# Patient Record
Sex: Female | Born: 1970
Health system: Southern US, Community
[De-identification: ages and names within clinical notes are randomized; demographics above are authoritative.]

## PROBLEM LIST (undated history)

## (undated) DIAGNOSIS — F419 Anxiety disorder, unspecified: Secondary | ICD-10-CM

## (undated) DIAGNOSIS — K449 Diaphragmatic hernia without obstruction or gangrene: Secondary | ICD-10-CM

## (undated) DIAGNOSIS — E785 Hyperlipidemia, unspecified: Secondary | ICD-10-CM

## (undated) DIAGNOSIS — K581 Irritable bowel syndrome with constipation: Secondary | ICD-10-CM

## (undated) DIAGNOSIS — G4731 Primary central sleep apnea: Secondary | ICD-10-CM

## (undated) DIAGNOSIS — F411 Generalized anxiety disorder: Secondary | ICD-10-CM

## (undated) DIAGNOSIS — Z803 Family history of malignant neoplasm of breast: Secondary | ICD-10-CM

## (undated) DIAGNOSIS — Z8042 Family history of malignant neoplasm of prostate: Secondary | ICD-10-CM

## (undated) DIAGNOSIS — F32A Depression, unspecified: Secondary | ICD-10-CM

## (undated) DIAGNOSIS — F329 Major depressive disorder, single episode, unspecified: Secondary | ICD-10-CM

## (undated) DIAGNOSIS — Z800 Family history of malignant neoplasm of digestive organs: Secondary | ICD-10-CM

## (undated) DIAGNOSIS — G4733 Obstructive sleep apnea (adult) (pediatric): Secondary | ICD-10-CM

## (undated) DIAGNOSIS — R7303 Prediabetes: Secondary | ICD-10-CM

## (undated) DIAGNOSIS — E782 Mixed hyperlipidemia: Secondary | ICD-10-CM

## (undated) DIAGNOSIS — K219 Gastro-esophageal reflux disease without esophagitis: Secondary | ICD-10-CM

## (undated) DIAGNOSIS — Z973 Presence of spectacles and contact lenses: Secondary | ICD-10-CM

## (undated) DIAGNOSIS — G4700 Insomnia, unspecified: Secondary | ICD-10-CM

## (undated) DIAGNOSIS — R0600 Dyspnea, unspecified: Secondary | ICD-10-CM

## (undated) DIAGNOSIS — R0609 Other forms of dyspnea: Secondary | ICD-10-CM

## (undated) DIAGNOSIS — J302 Other seasonal allergic rhinitis: Secondary | ICD-10-CM

## (undated) DIAGNOSIS — N8500 Endometrial hyperplasia, unspecified: Secondary | ICD-10-CM

## (undated) DIAGNOSIS — F319 Bipolar disorder, unspecified: Secondary | ICD-10-CM

## (undated) DIAGNOSIS — I10 Essential (primary) hypertension: Secondary | ICD-10-CM

## (undated) DIAGNOSIS — M1990 Unspecified osteoarthritis, unspecified site: Secondary | ICD-10-CM

## (undated) DIAGNOSIS — Z87442 Personal history of urinary calculi: Secondary | ICD-10-CM

## (undated) DIAGNOSIS — N939 Abnormal uterine and vaginal bleeding, unspecified: Secondary | ICD-10-CM

## (undated) DIAGNOSIS — Z801 Family history of malignant neoplasm of trachea, bronchus and lung: Secondary | ICD-10-CM

## (undated) DIAGNOSIS — J189 Pneumonia, unspecified organism: Secondary | ICD-10-CM

## (undated) HISTORY — PX: DILATION AND CURETTAGE OF UTERUS: SHX78

## (undated) HISTORY — DX: Family history of malignant neoplasm of breast: Z80.3

## (undated) HISTORY — DX: Family history of malignant neoplasm of digestive organs: Z80.0

## (undated) HISTORY — PX: DILATION AND EVACUATION: SHX1459

## (undated) HISTORY — DX: Family history of malignant neoplasm of prostate: Z80.42

## (undated) HISTORY — DX: Family history of malignant neoplasm of trachea, bronchus and lung: Z80.1

## (undated) HISTORY — DX: Hyperlipidemia, unspecified: E78.5

## (undated) HISTORY — DX: Bipolar disorder, unspecified: F31.9

## (undated) HISTORY — PX: CHOLECYSTECTOMY: SHX55

## (undated) HISTORY — DX: Anxiety disorder, unspecified: F41.9

---

## 1997-08-11 ENCOUNTER — Other Ambulatory Visit: Admission: RE | Admit: 1997-08-11 | Discharge: 1997-08-11 | Payer: Self-pay | Admitting: Gynecology

## 1997-11-29 ENCOUNTER — Emergency Department (HOSPITAL_COMMUNITY): Admission: EM | Admit: 1997-11-29 | Discharge: 1997-11-30 | Payer: Self-pay | Admitting: Emergency Medicine

## 1997-11-30 ENCOUNTER — Encounter: Payer: Self-pay | Admitting: Emergency Medicine

## 1999-12-09 ENCOUNTER — Other Ambulatory Visit: Admission: RE | Admit: 1999-12-09 | Discharge: 1999-12-09 | Payer: Self-pay | Admitting: *Deleted

## 2000-04-14 ENCOUNTER — Encounter: Payer: Self-pay | Admitting: *Deleted

## 2000-04-14 ENCOUNTER — Encounter: Admission: RE | Admit: 2000-04-14 | Discharge: 2000-04-14 | Payer: Self-pay | Admitting: *Deleted

## 2001-11-16 ENCOUNTER — Other Ambulatory Visit: Admission: RE | Admit: 2001-11-16 | Discharge: 2001-11-16 | Payer: Self-pay | Admitting: Obstetrics and Gynecology

## 2002-11-18 ENCOUNTER — Other Ambulatory Visit: Admission: RE | Admit: 2002-11-18 | Discharge: 2002-11-18 | Payer: Self-pay | Admitting: Obstetrics and Gynecology

## 2004-04-17 ENCOUNTER — Emergency Department (HOSPITAL_COMMUNITY): Admission: EM | Admit: 2004-04-17 | Discharge: 2004-04-17 | Payer: Self-pay | Admitting: Emergency Medicine

## 2004-05-31 ENCOUNTER — Emergency Department (HOSPITAL_COMMUNITY): Admission: EM | Admit: 2004-05-31 | Discharge: 2004-05-31 | Payer: Self-pay | Admitting: *Deleted

## 2005-07-15 ENCOUNTER — Ambulatory Visit (HOSPITAL_COMMUNITY): Admission: RE | Admit: 2005-07-15 | Discharge: 2005-07-15 | Payer: Self-pay | Admitting: Obstetrics and Gynecology

## 2005-07-15 ENCOUNTER — Encounter (INDEPENDENT_AMBULATORY_CARE_PROVIDER_SITE_OTHER): Payer: Self-pay | Admitting: Specialist

## 2005-07-15 HISTORY — PX: DILATION AND EVACUATION: SHX1459

## 2005-09-03 ENCOUNTER — Emergency Department (HOSPITAL_COMMUNITY): Admission: EM | Admit: 2005-09-03 | Discharge: 2005-09-03 | Payer: Self-pay | Admitting: Family Medicine

## 2006-01-14 ENCOUNTER — Emergency Department (HOSPITAL_COMMUNITY): Admission: EM | Admit: 2006-01-14 | Discharge: 2006-01-14 | Payer: Self-pay | Admitting: Emergency Medicine

## 2006-12-27 ENCOUNTER — Encounter: Admission: RE | Admit: 2006-12-27 | Discharge: 2006-12-27 | Payer: Self-pay | Admitting: Internal Medicine

## 2010-02-21 ENCOUNTER — Encounter: Payer: Self-pay | Admitting: Obstetrics and Gynecology

## 2010-02-21 ENCOUNTER — Encounter: Payer: Self-pay | Admitting: Internal Medicine

## 2010-06-18 NOTE — Op Note (Signed)
Samantha Greene, Samantha Greene             ACCOUNT NO.:  [id number]   MEDICAL RECORD NO.:  [id number]          PATIENT TYPE:  AMB   LOCATION:  SDC                           FACILITY:  [facility]   PHYSICIAN:  Lenoard Aden, M.D.DATE OF BIRTH:  04/18/1970   DATE OF PROCEDURE:  07/15/2005  DATE OF DISCHARGE:                                 OPERATIVE REPORT   PREOPERATIVE DIAGNOSIS:  Twelve-week intrauterine pregnancy with missed  abortion.   POSTOPERATIVE DIAGNOSIS:  Twelve-week intrauterine pregnancy with missed  abortion.   PROCEDURE:  Suction D&U.   SURGEON:  Lenoard Aden, MD.   ANESTHESIA:  MAC, spinal.   ESTIMATED BLOOD LOSS:  50 ml.   COMPLICATIONS:  None.   DRAINS:  None.   COUNTS:  Correct.   Patient to recovery room in good condition.   BRIEF OPERATIVE NOTE:  After being apprised of the risks of anesthesia,  infection, bleeding, intraabdominal need for repair, and the possible risk  of uterine perforation and the need for repair, the patient was brought to  the operating room where she was administered a spinal anesthetic without  complications.  She was prepped and draped in the usual sterile fashion and  catheterized until the bladder was empty.  After achieving adequate  anesthesia, a dilute Nesacaine solution placed for a paracervical block in  the standard fashion, 20 ml total.  The cervix was easily dilated up to a  #33 Pratt dilator, a 10 ml suction current placed, suction initiated, and  suction obtained and noted.  A blunt curettage reveals the cavity to be  empty.  Repeat suction curettage confirms.  All instruments were removed.  Good hemostasis noted.  The patient tolerated the procedure and was  transferred to recovery in good condition.      Lenoard Aden, M.D.  Electronically Signed     RJT/MEDQ  D:  07/15/2005  T:  07/15/2005  Job:  [id number]

## 2010-09-07 ENCOUNTER — Other Ambulatory Visit: Payer: Self-pay | Admitting: Obstetrics & Gynecology

## 2010-09-07 DIAGNOSIS — N63 Unspecified lump in unspecified breast: Secondary | ICD-10-CM

## 2010-11-22 ENCOUNTER — Ambulatory Visit
Admission: RE | Admit: 2010-11-22 | Discharge: 2010-11-22 | Disposition: A | Discharge status: Home / Self Care | Payer: PRIVATE HEALTH INSURANCE | Source: Ambulatory Visit | Attending: Obstetrics & Gynecology | Admitting: Obstetrics & Gynecology

## 2010-11-22 DIAGNOSIS — N63 Unspecified lump in unspecified breast: Secondary | ICD-10-CM

## 2010-12-02 ENCOUNTER — Other Ambulatory Visit: Payer: Self-pay | Admitting: Obstetrics & Gynecology

## 2010-12-25 ENCOUNTER — Encounter (HOSPITAL_COMMUNITY): Payer: Self-pay | Admitting: Pharmacist

## 2010-12-31 ENCOUNTER — Encounter (HOSPITAL_COMMUNITY): Payer: Self-pay

## 2010-12-31 ENCOUNTER — Encounter (HOSPITAL_COMMUNITY)
Admission: RE | Admit: 2010-12-31 | Discharge: 2010-12-31 | Disposition: A | Discharge status: Home / Self Care | Payer: PRIVATE HEALTH INSURANCE | Source: Ambulatory Visit | Attending: Obstetrics & Gynecology | Admitting: Obstetrics & Gynecology

## 2010-12-31 HISTORY — DX: Essential (primary) hypertension: I10

## 2010-12-31 HISTORY — DX: Major depressive disorder, single episode, unspecified: F32.9

## 2010-12-31 HISTORY — DX: Depression, unspecified: F32.A

## 2010-12-31 LAB — CBC
HCT: 39.1 % (ref 36.0–46.0)
Hemoglobin: 13.4 g/dL (ref 12.0–15.0)
MCH: 32.4 pg (ref 26.0–34.0)
Platelets: 259 10*3/uL (ref 150–400)
RBC: 4.13 MIL/uL (ref 3.87–5.11)
RDW: 12.3 % (ref 11.5–15.5)
WBC: 6.9 10*3/uL (ref 4.0–10.5)

## 2010-12-31 NOTE — Patient Instructions (Signed)
YOUR PROCEDURE IS SCHEDULED ON:01/07/11  ENTER THROUGH THE MAIN ENTRANCE OF [facility] AT:1130am  USE DESK PHONE AND DIAL [id number] TO INFORM us OF YOUR ARRIVAL  CALL [phone] IF YOU HAVE ANY QUESTIONS OR PROBLEMS PRIOR TO YOUR ARRIVAL.  REMEMBER: DO NOT EAT AFTER MIDNIGHT :Thursday  SPECIAL INSTRUCTIONS:Clear liquids ok until 9am Fri   YOU MAY BRUSH YOUR TEETH THE MORNING OF SURGERY   TAKE THESE MEDICINES THE DAY OF SURGERY WITH SIP OF WATER: none   DO NOT WEAR JEWELRY, EYE MAKEUP, LIPSTICK OR DARK FINGERNAIL POLISH DO NOT WEAR LOTIONS  DO NOT SHAVE FOR 48 HOURS PRIOR TO SURGERY  YOU WILL NOT BE ALLOWED TO DRIVE YOURSELF HOME.  NAME OF DRIVER: Samantha Greene

## 2011-01-07 ENCOUNTER — Ambulatory Visit (HOSPITAL_COMMUNITY): Payer: PRIVATE HEALTH INSURANCE | Admitting: Anesthesiology

## 2011-01-07 ENCOUNTER — Encounter (HOSPITAL_COMMUNITY): Payer: Self-pay | Admitting: Anesthesiology

## 2011-01-07 ENCOUNTER — Encounter (HOSPITAL_COMMUNITY)
Admission: RE | Disposition: A | Discharge status: Home / Self Care | Payer: Self-pay | Source: Ambulatory Visit | Attending: Obstetrics & Gynecology

## 2011-01-07 ENCOUNTER — Other Ambulatory Visit: Payer: Self-pay | Admitting: Obstetrics & Gynecology

## 2011-01-07 ENCOUNTER — Ambulatory Visit (HOSPITAL_COMMUNITY)
Admission: RE | Admit: 2011-01-07 | Discharge: 2011-01-07 | Disposition: A | Discharge status: Home / Self Care | Payer: PRIVATE HEALTH INSURANCE | Source: Ambulatory Visit | Attending: Obstetrics & Gynecology | Admitting: Obstetrics & Gynecology

## 2011-01-07 ENCOUNTER — Encounter (HOSPITAL_COMMUNITY): Payer: Self-pay | Admitting: *Deleted

## 2011-01-07 DIAGNOSIS — N938 Other specified abnormal uterine and vaginal bleeding: Secondary | ICD-10-CM | POA: Insufficient documentation

## 2011-01-07 DIAGNOSIS — Z01818 Encounter for other preprocedural examination: Secondary | ICD-10-CM | POA: Insufficient documentation

## 2011-01-07 DIAGNOSIS — N939 Abnormal uterine and vaginal bleeding, unspecified: Secondary | ICD-10-CM | POA: Diagnosis present

## 2011-01-07 DIAGNOSIS — N840 Polyp of corpus uteri: Secondary | ICD-10-CM | POA: Insufficient documentation

## 2011-01-07 DIAGNOSIS — N949 Unspecified condition associated with female genital organs and menstrual cycle: Secondary | ICD-10-CM | POA: Insufficient documentation

## 2011-01-07 DIAGNOSIS — Z01812 Encounter for preprocedural laboratory examination: Secondary | ICD-10-CM | POA: Insufficient documentation

## 2011-01-07 HISTORY — PX: HYSTEROSCOPY WITH D & C: SHX1775

## 2011-01-07 SURGERY — DILATATION & CURETTAGE/HYSTEROSCOPY WITH RESECTOCOPE
Anesthesia: Choice

## 2011-01-07 MED ORDER — LIDOCAINE HCL (CARDIAC) 20 MG/ML IV SOLN
INTRAVENOUS | Status: DC | PRN
  Administered 2011-01-07 (×2): 50 mg via INTRAVENOUS

## 2011-01-07 MED ORDER — MIDAZOLAM HCL 5 MG/5ML IJ SOLN
INTRAMUSCULAR | Status: DC | PRN
  Administered 2011-01-07: 2 mg via INTRAVENOUS

## 2011-01-07 MED ORDER — ONDANSETRON HCL 4 MG/2ML IJ SOLN
INTRAMUSCULAR | Status: DC | PRN
  Administered 2011-01-07: 4 mg via INTRAVENOUS

## 2011-01-07 MED ORDER — ONDANSETRON HCL 4 MG/2ML IJ SOLN: 4.0000 mg | Freq: Once | INTRAMUSCULAR | Status: DC | PRN

## 2011-01-07 MED ORDER — FENTANYL CITRATE 0.05 MG/ML IJ SOLN
INTRAMUSCULAR | Status: AC
  Filled 2011-01-07: qty 2

## 2011-01-07 MED ORDER — LIDOCAINE HCL (CARDIAC) 20 MG/ML IV SOLN
INTRAVENOUS | Status: AC
  Filled 2011-01-07: qty 5

## 2011-01-07 MED ORDER — ONDANSETRON HCL 4 MG/2ML IJ SOLN
INTRAMUSCULAR | Status: AC
  Filled 2011-01-07: qty 2

## 2011-01-07 MED ORDER — FENTANYL CITRATE 0.05 MG/ML IJ SOLN
INTRAMUSCULAR | Status: DC | PRN
  Administered 2011-01-07: 100 ug via INTRAVENOUS

## 2011-01-07 MED ORDER — DEXAMETHASONE SODIUM PHOSPHATE 10 MG/ML IJ SOLN
INTRAMUSCULAR | Status: AC
  Filled 2011-01-07: qty 1

## 2011-01-07 MED ORDER — KETOROLAC TROMETHAMINE 30 MG/ML IJ SOLN: 15.0000 mg | Freq: Once | INTRAMUSCULAR | Status: DC | PRN

## 2011-01-07 MED ORDER — MEPERIDINE HCL 25 MG/ML IJ SOLN: 6.2500 mg | INTRAMUSCULAR | Status: DC | PRN

## 2011-01-07 MED ORDER — DEXAMETHASONE SODIUM PHOSPHATE 4 MG/ML IJ SOLN
INTRAMUSCULAR | Status: DC | PRN
  Administered 2011-01-07: 10 mg via INTRAVENOUS

## 2011-01-07 MED ORDER — LIDOCAINE HCL 1 % IJ SOLN
INTRAMUSCULAR | Status: DC | PRN
  Administered 2011-01-07: 10 mL

## 2011-01-07 MED ORDER — FENTANYL CITRATE 0.05 MG/ML IJ SOLN
25.0000 ug | INTRAMUSCULAR | Status: DC | PRN
  Administered 2011-01-07: 50 ug via INTRAVENOUS

## 2011-01-07 MED ORDER — KETOROLAC TROMETHAMINE 30 MG/ML IJ SOLN
INTRAMUSCULAR | Status: DC | PRN
  Administered 2011-01-07: 30 mg via INTRAVENOUS

## 2011-01-07 MED ORDER — LACTATED RINGERS IV SOLN
INTRAVENOUS | Status: DC
  Administered 2011-01-07 (×2): via INTRAVENOUS

## 2011-01-07 MED ORDER — KETOROLAC TROMETHAMINE 30 MG/ML IJ SOLN
INTRAMUSCULAR | Status: AC
  Filled 2011-01-07: qty 1

## 2011-01-07 MED ORDER — PROPOFOL 10 MG/ML IV EMUL
INTRAVENOUS | Status: DC | PRN
  Administered 2011-01-07: 200 mg via INTRAVENOUS

## 2011-01-07 MED ORDER — PROPOFOL 10 MG/ML IV EMUL
INTRAVENOUS | Status: AC
  Filled 2011-01-07: qty 20

## 2011-01-07 MED ORDER — MIDAZOLAM HCL 2 MG/2ML IJ SOLN
INTRAMUSCULAR | Status: AC
  Filled 2011-01-07: qty 2

## 2011-01-07 SURGICAL SUPPLY — 15 items
CANISTER SUCTION 2500CC (MISCELLANEOUS) ×2 IMPLANT
CATH ROBINSON RED A/P 16FR (CATHETERS) ×2 IMPLANT
CLOTH BEACON ORANGE TIMEOUT ST (SAFETY) ×2 IMPLANT
CONTAINER PREFILL 10% NBF 60ML (FORM) ×4 IMPLANT
ELECT REM PT RETURN 9FT ADLT (ELECTROSURGICAL)
ELECTRODE REM PT RTRN 9FT ADLT (ELECTROSURGICAL) IMPLANT
GLOVE BIO SURGEON STRL SZ 6.5 (GLOVE) ×4 IMPLANT
GOWN PREVENTION PLUS LG XLONG (DISPOSABLE) ×4 IMPLANT
GOWN STRL REIN XL XLG (GOWN DISPOSABLE) ×2 IMPLANT
LOOP ANGLED CUTTING 22FR (CUTTING LOOP) IMPLANT
NDL SPNL 20GX3.5 QUINCKE YW (NEEDLE) IMPLANT
NEEDLE SPNL 20GX3.5 QUINCKE YW (NEEDLE) IMPLANT
PACK HYSTEROSCOPY LF (CUSTOM PROCEDURE TRAY) ×2 IMPLANT
TOWEL OR 17X24 6PK STRL BLUE (TOWEL DISPOSABLE) ×4 IMPLANT
WATER STERILE IRR 1000ML POUR (IV SOLUTION) ×2 IMPLANT

## 2011-01-07 NOTE — Anesthesia Procedure Notes (Signed)
Procedure Name: LMA Insertion Date/Time: 01/07/2011 1:14 PM Performed by: Isabella Bowens Pre-anesthesia Checklist: Patient identified, Timeout performed, Emergency Drugs available, Suction available and Patient being monitored Patient Re-evaluated:Patient Re-evaluated prior to inductionOxygen Delivery Method: Circle System Utilized Preoxygenation: Pre-oxygenation with 100% oxygen Intubation Type: IV induction Ventilation: Mask ventilation without difficulty LMA Size: 4.0 Grade View: Grade I Number of attempts: 1 Dental Injury: Teeth and Oropharynx as per pre-operative assessment

## 2011-01-07 NOTE — Anesthesia Preprocedure Evaluation (Signed)
Anesthesia Evaluation  Patient identified by MRN, date of birth, ID band Patient awake    Reviewed: Allergy & Precautions, H&P , NPO status , Patient's Chart, lab work & pertinent test results, reviewed documented beta blocker date and time   Airway Mallampati: I TM Distance: >3 FB Neck ROM: full    Dental  (+) Teeth Intact   Pulmonary neg pulmonary ROS,  clear to auscultation  Pulmonary exam normal       Cardiovascular     Neuro/Psych PSYCHIATRIC DISORDERS Depression Negative Neurological ROS     GI/Hepatic negative GI ROS, Neg liver ROS,   Endo/Other  Negative Endocrine ROS  Renal/GU negative Renal ROS  Genitourinary negative   Musculoskeletal negative musculoskeletal ROS (+)   Abdominal Normal abdominal exam  (+)   Peds negative pediatric ROS (+)  Hematology negative hematology ROS (+)   Anesthesia Other Findings   Reproductive/Obstetrics negative OB ROS                           Anesthesia Physical Anesthesia Plan  ASA: II  Anesthesia Plan: General   Post-op Pain Management:    Induction: Intravenous  Airway Management Planned: LMA  Additional Equipment:   Intra-op Plan:   Post-operative Plan:   Informed Consent: I have reviewed the patients History and Physical, chart, labs and discussed the procedure including the risks, benefits and alternatives for the proposed anesthesia with the patient or authorized representative who has indicated his/her understanding and acceptance.     Plan Discussed with: CRNA  Anesthesia Plan Comments:         Anesthesia Quick Evaluation

## 2011-01-07 NOTE — Transfer of Care (Signed)
Immediate Anesthesia Transfer of Care Note  Patient: Samantha Greene  Procedure(s) Performed:  DILATATION & CURETTAGE/HYSTEROSCOPY WITH RESECTOSCOPE  Patient Location: PACU  Anesthesia Type: General  Level of Consciousness: awake, alert  and oriented  Airway & Oxygen Therapy: Patient Spontanous Breathing and Patient connected to nasal cannula oxygen  Post-op Assessment: Report given to PACU RN and Post -op Vital signs reviewed and stable  Post vital signs: Reviewed and stable  Complications: No apparent anesthesia complications

## 2011-01-07 NOTE — Anesthesia Postprocedure Evaluation (Signed)
Anesthesia Post Note  Patient: Samantha Greene  Procedure(s) Performed:  DILATATION & CURETTAGE/HYSTEROSCOPY WITH RESECTOSCOPE  Anesthesia type: General  Patient location: PACU  Post pain: Pain level controlled  Post assessment: Post-op Vital signs reviewed  Last Vitals:  Filed Vitals:   01/07/11 1142  BP: 127/87  Pulse: 93  Temp: 36.9 C  Resp: 18    Post vital signs: Reviewed  Level of consciousness: sedated  Complications: No apparent anesthesia complicationsfj

## 2011-01-07 NOTE — H&P (Signed)
Chief Complaint: 40 y.o.  who presents with AUB  Details of Present Illness: W/U has included an U/S with a thickened endometrial lining/possible polyp.  BP 127/87  Pulse 93  Temp(Src) 98.4 F (36.9 C) (Oral)  Resp 18  SpO2 99%  LMP 01/07/2011  Past Medical History  Diagnosis Date  . Hypertension     no meds  . Depression     no meds   History   Social History  . Marital Status: Married    Spouse Name: N/A    Number of Children: N/A  . Years of Education: N/A   Occupational History  . Not on file.   Social History Main Topics  . Smoking status: Never Smoker   . Smokeless tobacco: Not on file  . Alcohol Use: No  . Drug Use: No  . Sexually Active:    Other Topics Concern  . Not on file   Social History Narrative  . No narrative on file   History reviewed. No pertinent family history.  Pertinent items are noted in HPI.  Pre-Op Diagnosis: Endometrial Polyp   Planned Procedure: Procedure(s): DILATATION & CURETTAGE/HYSTEROSCOPY WITH RESECTOSCOPE  I have reviewed the patient's history and have completed the physical exam and QUINCI GAVIDIA is acceptable for surgery.  Roseanna Rainbow, MD 01/07/2011 12:52 PM

## 2011-01-07 NOTE — Op Note (Signed)
Preoperative diagnosis: AUB, R/O Endometrial polyp  Postoperative diagnosis: Same  Procedure: Diagnostic hysteroscopy, dilatation and curettage, polypectomy  Surgeon: Antionette Char A  Anesthesia:LMA, paracervical block  Estimated blood loss: Minimal    IV Fluids: per Anesthesiology  Complications: None  Specimen: PATHOLOGY  Operative Findings: Two polypoid structures: (1) pedunculated, 1 cm, arising from the anterior wall of the uterine fundus, slightly to the right (2) sessile, 0.5 cm, left lateral sidewall, cornu.  The remainder of the endometrial lining was thin.  The tubal ostia were not well visualized.  Description of procedure:   The patient was taken to the operating room and placed on the operating table in the semi-lithotomy position in [place] stirrups.  Examination under anesthesia was performed.  The patient was prepped and draped in the usual manner.  After a time-out had been completed, a speculum was placed in the vagina.  The anterior lip of the cervix was grasped with a single-toothed tenaculum.    The endocervical canal was dilated with Shawnie Pons dilators.  A 5 mm diagnostic hysteroscope with Glycine as the distending medium was used to perform a diagnostic hysteroscopy.  The hysteroscope was removed. A polyp forceps was introduced to remove the polyps.  Alternatively, the second polyp was grasped under direct visualization with a grasper introduced through the operative port of the hysteroscope.   A small, Sims curette was used to perform an endometrial curettage.    All the instruments were removed from the vagina.  Final instrument counts were correct.  The patient was taken to the PACU in stable condition.

## 2011-03-01 ENCOUNTER — Encounter: Payer: Self-pay | Admitting: Pulmonary Disease

## 2011-03-01 ENCOUNTER — Ambulatory Visit (INDEPENDENT_AMBULATORY_CARE_PROVIDER_SITE_OTHER): Payer: PRIVATE HEALTH INSURANCE | Admitting: Pulmonary Disease

## 2011-03-01 DIAGNOSIS — N926 Irregular menstruation, unspecified: Secondary | ICD-10-CM

## 2011-03-01 DIAGNOSIS — G4700 Insomnia, unspecified: Secondary | ICD-10-CM

## 2011-03-01 DIAGNOSIS — N939 Abnormal uterine and vaginal bleeding, unspecified: Secondary | ICD-10-CM

## 2011-03-01 NOTE — Patient Instructions (Signed)
I doubt that you have obstructive sleep apnea or restless legs as a cause of your insomnia Try to avoid medications for sleep - all category C Tylenol pm or benadryl probably safe & can be used once in a while We also discussed behaviour teachniques Call if problems worsen

## 2011-03-01 NOTE — Progress Notes (Signed)
  Subjective:    Patient ID: Samantha Greene, female    DOB: April 24, 1970, 41 y.o.   MRN: [id number]  HPI 41 year old for evaluation of insomnia. She has been diagnosed with polycystic ovarian syndrome, is undergoing fertility treatment with Dr. Jeannie Fend. bupropion was recently prescribed for depression but she has not started this. She does not have a problem going to sleep but cannot stay asleep. She wakes up most nights between 3 and 3:30 AM and cannot go back to sleep. Bedtime is between 9 and 10 PM, sleep latency is 10-20 minutes, she is one awakening, denies nocturia, on the day she gets out of bed between 6 to 7 a.m. on weekends she'll stay in bed until 9. She sleeps on her side with one pillow. Her weight has not fluctuated in the last 2 years. Epworth sleepiness score is 2/24. She denies use of caffeinated beverages. She wants to avoid medications if possible since she is trying to get pregnant. Melatonin was contraindicated according to her OB. She has tried ZZquil and Benadryl. There is no history suggestive of cataplexy, sleep paralysis or parasomnias This history suggestive of restless leg syndrome such as creepy from the sensations in her legs. Is no history of loud snoring, excessive daytime somnolence and gasping episodes during sleep    Review of Systems  Constitutional: Positive for appetite change. Negative for fever and unexpected weight change.  HENT: Negative for ear pain, nosebleeds, congestion, sore throat, rhinorrhea, sneezing, trouble swallowing, dental problem, postnasal drip and sinus pressure.   Eyes: Negative for redness and itching.  Respiratory: Negative for cough, chest tightness, shortness of breath and wheezing.   Cardiovascular: Negative for palpitations and leg swelling.  Gastrointestinal: Positive for abdominal pain. Negative for nausea and vomiting.  Genitourinary: Negative for dysuria.  Musculoskeletal: Negative for joint swelling.  Skin: Negative for rash.    Neurological: Negative for headaches.  Hematological: Does not bruise/bleed easily.  Psychiatric/Behavioral: Positive for dysphoric mood. The patient is not nervous/anxious.        Objective:   Physical Exam  Gen. Pleasant, obese, in no distress, normal affect ENT - no lesions, no post nasal drip, class 2-3 airway Neck: No JVD, no thyromegaly, no carotid bruits Lungs: no use of accessory muscles, no dullness to percussion, decreased without rales or rhonchi  Cardiovascular: Rhythm regular, heart sounds  normal, no murmurs or gallops, no peripheral edema Abdomen: soft and non-tender, no hepatosplenomegaly, BS normal. Musculoskeletal: No deformities, no cyanosis or clubbing Neuro:  alert, non focal, no tremors       Assessment & Plan:

## 2011-03-02 DIAGNOSIS — G4700 Insomnia, unspecified: Secondary | ICD-10-CM | POA: Insufficient documentation

## 2011-03-02 NOTE — Assessment & Plan Note (Signed)
Her problem seems to be insomnia of sleep maintenance. This may be related to underlying depression. We discussed various behavioral techniques for insomnia. We discussed that all medications for insomnia were category 3 agents and best avoided when she is trying to get pregnant. Discussed use of melatonin but this has been contraindicated by her OB. We discussed using an anti-histaminic such as Benadryl for sleep and the long-term side effects of such medications. Unconvinced clinically that she does not have obstructive sleep apnea or restless leg syndromes and as such  sleep study is not indicated at this time. We discussed treatment of underlying depression with an agent such as trazodone which would also help with her sleep but again I would advise against this since she is trying to get pregnant.

## 2012-03-23 ENCOUNTER — Other Ambulatory Visit: Payer: Self-pay | Admitting: Obstetrics & Gynecology

## 2012-03-23 DIAGNOSIS — Z1231 Encounter for screening mammogram for malignant neoplasm of breast: Secondary | ICD-10-CM

## 2012-04-09 ENCOUNTER — Ambulatory Visit: Payer: PRIVATE HEALTH INSURANCE

## 2012-04-24 ENCOUNTER — Other Ambulatory Visit: Payer: Self-pay | Admitting: Obstetrics & Gynecology

## 2012-08-22 ENCOUNTER — Other Ambulatory Visit: Payer: Self-pay | Admitting: Podiatry

## 2012-08-22 DIAGNOSIS — M79671 Pain in right foot: Secondary | ICD-10-CM

## 2012-08-26 ENCOUNTER — Other Ambulatory Visit: Payer: PRIVATE HEALTH INSURANCE

## 2012-08-30 ENCOUNTER — Other Ambulatory Visit: Payer: PRIVATE HEALTH INSURANCE

## 2012-09-18 ENCOUNTER — Other Ambulatory Visit: Payer: Self-pay | Admitting: Obstetrics & Gynecology

## 2012-09-19 NOTE — Telephone Encounter (Signed)
Rx refills denied. Pharmacy advised to have patient contact office.

## 2012-09-30 ENCOUNTER — Other Ambulatory Visit: Payer: Self-pay | Admitting: Obstetrics & Gynecology

## 2012-10-25 ENCOUNTER — Encounter: Payer: Self-pay | Admitting: Obstetrics & Gynecology

## 2012-10-25 ENCOUNTER — Ambulatory Visit (INDEPENDENT_AMBULATORY_CARE_PROVIDER_SITE_OTHER): Payer: BC Managed Care – PPO | Admitting: Obstetrics & Gynecology

## 2012-10-25 VITALS — BP 131/94 | HR 79 | Temp 98.2°F | Wt 188.0 lb

## 2012-10-25 DIAGNOSIS — N803 Endometriosis of pelvic peritoneum: Secondary | ICD-10-CM

## 2012-10-25 DIAGNOSIS — N979 Female infertility, unspecified: Secondary | ICD-10-CM

## 2012-10-25 DIAGNOSIS — IMO0002 Reserved for concepts with insufficient information to code with codable children: Secondary | ICD-10-CM

## 2012-10-25 DIAGNOSIS — Z23 Encounter for immunization: Secondary | ICD-10-CM

## 2012-10-25 MED ORDER — TRAMADOL HCL 50 MG PO TABS: 50.0000 mg | ORAL_TABLET | Freq: Four times a day (QID) | ORAL | Status: DC | PRN

## 2012-10-25 MED ORDER — MEFENAMIC ACID 250 MG PO CAPS: 1.0000 | ORAL_CAPSULE | Freq: Four times a day (QID) | ORAL | Status: DC | PRN

## 2012-10-25 NOTE — Progress Notes (Signed)
Subjective:     ARHIANNA EBEY is a 42 y.o. female here for a routine exam.  Current complaints: problem visit. Pt states she is here today to discuss medication refill. Pt states she is needing a refill on Vicodin, Tramadol and Ibuprofen. Pt states she only uses these medications during her cycles.   Personal health questionnaire reviewed: yes.   Gynecologic History Patient's last menstrual period was 09/29/2012. Contraception: none   Obstetric History OB History  No data available     The following portions of the patient's history were reviewed and updated as appropriate: allergies, current medications, past family history, past medical history, past social history, past surgical history and problem list.  Review of Systems Pertinent items are noted in HPI.    Objective:   No exam today  Assessment:   H/O endometriosis, chronic pain Trying to conceive  Plan:     Encouraged f/u with [facility] Trial of Ponstel Consider PT/Behavioral Health referral Return in a few months Orders Placed This Encounter  Procedures  . Flu Vaccine QUAD 36+ mos IM  . CA 125  . Anti mullerian hormone

## 2012-10-26 ENCOUNTER — Encounter: Payer: Self-pay | Admitting: Obstetrics & Gynecology

## 2012-10-26 LAB — CA 125: CA 125: 9.8 U/mL (ref 0.0–30.2)

## 2012-10-28 LAB — ANTI MULLERIAN HORMONE: AMH AssessR: 2.63 ng/mL

## 2012-11-13 ENCOUNTER — Emergency Department (HOSPITAL_BASED_OUTPATIENT_CLINIC_OR_DEPARTMENT_OTHER)
Admission: EM | Admit: 2012-11-13 | Discharge: 2012-11-13 | Disposition: A | Discharge status: Home / Self Care | Payer: BC Managed Care – PPO | Attending: Emergency Medicine | Admitting: Emergency Medicine

## 2012-11-13 ENCOUNTER — Emergency Department (HOSPITAL_BASED_OUTPATIENT_CLINIC_OR_DEPARTMENT_OTHER): Payer: BC Managed Care – PPO

## 2012-11-13 ENCOUNTER — Encounter (HOSPITAL_BASED_OUTPATIENT_CLINIC_OR_DEPARTMENT_OTHER): Payer: Self-pay | Admitting: Emergency Medicine

## 2012-11-13 DIAGNOSIS — Z862 Personal history of diseases of the blood and blood-forming organs and certain disorders involving the immune mechanism: Secondary | ICD-10-CM | POA: Insufficient documentation

## 2012-11-13 DIAGNOSIS — S60229A Contusion of unspecified hand, initial encounter: Secondary | ICD-10-CM | POA: Insufficient documentation

## 2012-11-13 DIAGNOSIS — W540XXA Bitten by dog, initial encounter: Secondary | ICD-10-CM | POA: Insufficient documentation

## 2012-11-13 DIAGNOSIS — S61409A Unspecified open wound of unspecified hand, initial encounter: Secondary | ICD-10-CM | POA: Insufficient documentation

## 2012-11-13 DIAGNOSIS — Y939 Activity, unspecified: Secondary | ICD-10-CM | POA: Insufficient documentation

## 2012-11-13 DIAGNOSIS — Y929 Unspecified place or not applicable: Secondary | ICD-10-CM | POA: Insufficient documentation

## 2012-11-13 DIAGNOSIS — Z79899 Other long term (current) drug therapy: Secondary | ICD-10-CM | POA: Insufficient documentation

## 2012-11-13 DIAGNOSIS — Z8639 Personal history of other endocrine, nutritional and metabolic disease: Secondary | ICD-10-CM | POA: Insufficient documentation

## 2012-11-13 DIAGNOSIS — I10 Essential (primary) hypertension: Secondary | ICD-10-CM | POA: Insufficient documentation

## 2012-11-13 DIAGNOSIS — F3289 Other specified depressive episodes: Secondary | ICD-10-CM | POA: Insufficient documentation

## 2012-11-13 DIAGNOSIS — F329 Major depressive disorder, single episode, unspecified: Secondary | ICD-10-CM | POA: Insufficient documentation

## 2012-11-13 MED ORDER — OXYCODONE-ACETAMINOPHEN 5-325 MG PO TABS: 1.0000 | ORAL_TABLET | Freq: Four times a day (QID) | ORAL | Status: DC | PRN

## 2012-11-13 MED ORDER — HYDROMORPHONE HCL PF 1 MG/ML IJ SOLN
1.0000 mg | Freq: Once | INTRAMUSCULAR | Status: AC
  Administered 2012-11-13: 1 mg via INTRAMUSCULAR
  Filled 2012-11-13: qty 1

## 2012-11-13 MED ORDER — AMOXICILLIN-POT CLAVULANATE 875-125 MG PO TABS: 1.0000 | ORAL_TABLET | Freq: Two times a day (BID) | ORAL | Status: DC

## 2012-11-13 MED ORDER — AMOXICILLIN-POT CLAVULANATE 875-125 MG PO TABS
1.0000 | ORAL_TABLET | Freq: Once | ORAL | Status: AC
  Administered 2012-11-13: 1 via ORAL
  Filled 2012-11-13: qty 1

## 2012-11-13 MED ORDER — ONDANSETRON 4 MG PO TBDP
4.0000 mg | ORAL_TABLET | Freq: Once | ORAL | Status: AC
  Administered 2012-11-13: 4 mg via ORAL
  Filled 2012-11-13: qty 1

## 2012-11-13 NOTE — ED Notes (Signed)
Attacked by the neighbors pit bull. Unsure if rabies vaccine is UTD. Multiple punctures to both hands. Animal control has been contacted.

## 2012-11-13 NOTE — ED Notes (Signed)
Pt reports she was attempting to stop a dog from attacking her dog when she was bitten on both the left and right hand. Pt w/ multiple small puncture wounds to right hand and fingers as well as left hand, small amount of blood noted draining from wounds at present. Pt states that the dog belongs to her neighbor and is unsure of vaccination status of the dog.

## 2012-11-13 NOTE — ED Provider Notes (Signed)
CSN: [id number]     Arrival date & time 11/13/12  1907 History  This chart was scribed for Gwyneth Sprout, MD by Danella Maiers, ED Scribe. This patient was seen in room MH02/MH02 and the patient's care was started at 7:21 PM.   Chief Complaint  Patient presents with  . Animal Bite   Patient is a 42 y.o. female presenting with animal bite. The history is provided by the patient. No language interpreter was used.  Animal Bite  HPI Comments: Samantha Greene is a 42 y.o. female who presents to the Emergency Department complaining of lacerations to both hands after being attacked by a neighbor's pit bull this evening. The pit bull was attacking her dog and she intervened. She has not tried anything for the pain. Her last tetanus shot was five years ago.   Past Medical History  Diagnosis Date  . Hypertension     no meds  . Depression     no meds  . Hyperlipidemia    Past Surgical History  Procedure Laterality Date  . Dilation and curettage of uterus    . Dilation and evacuation     Family History  Problem Relation Age of Onset  . Breast cancer Mother   . Lung cancer Mother   . Colon cancer    . Prostate cancer    . Breast cancer Maternal Aunt    History  Substance Use Topics  . Smoking status: Never Smoker   . Smokeless tobacco: Not on file  . Alcohol Use: No   OB History   Grav Para Term Preterm Abortions TAB SAB Ect Mult Living                 Review of Systems A complete 10 system review of systems was obtained and all systems are negative except as noted in the HPI and PMH.   Allergies  Erythromycin and Flexeril  Home Medications   Current Outpatient Rx  Name  Route  Sig  Dispense  Refill  . buPROPion (WELLBUTRIN XL) 300 MG 24 hr tablet   Oral   Take 300 mg by mouth daily.         . Cholecalciferol (VITAMIN D) 2000 UNITS tablet   Oral   Take 4,000 Units by mouth 2 (two) times daily. Pt takes Finest Nutrition Bone Health         . Cyanocobalamin  (VITAMIN B 12) 100 MCG LOZG   Oral   Take by mouth.         . DiphenhydrAMINE HCl, Sleep, (ZZZQUIL) 25 MG CAPS   Oral   Take 1 capsule by mouth as needed.         Marland Kitchen HYDROcodone-acetaminophen (VICODIN) 5-500 MG per tablet   Oral   Take 1-2 tablets by mouth every 4 (four) hours as needed. For pain during period          . loratadine (CLARITIN) 10 MG tablet   Oral   Take 10 mg by mouth daily as needed. For allergy symptoms          . Mefenamic Acid 250 MG CAPS   Oral   Take 1 capsule (250 mg total) by mouth every 6 (six) hours as needed. Take 2 tabs initially   28 each   3   . OVER THE COUNTER MEDICATION   Oral   Take 1 capsule by mouth at bedtime as needed. NatureMade Natural Sleep Aid. For sleep          .  prenatal vitamin w/FE, FA (PRENATAL 1 + 1) 27-1 MG TABS   Oral   Take 1 tablet by mouth daily.           . traMADol (ULTRAM) 50 MG tablet   Oral   Take 1 tablet (50 mg total) by mouth every 6 (six) hours as needed for pain.   90 tablet   0    BP 140/87  Pulse 105  Temp(Src) 99.2 F (37.3 C) (Oral)  Resp 20  Ht 5\' 2"  (1.575 m)  Wt 183 lb (83.008 kg)  BMI 33.46 kg/m2  LMP 09/29/2012 Physical Exam  Nursing note and vitals reviewed. Constitutional: She is oriented to person, place, and time. She appears well-developed and well-nourished. No distress.  HENT:  Head: Normocephalic and atraumatic.  Eyes: EOM are normal.  Neck: Neck supple.  Cardiovascular: Normal rate.   Pulmonary/Chest: Effort normal.  Musculoskeletal: Normal range of motion.       Left hand: She exhibits tenderness and swelling. She exhibits normal range of motion and no deformity. Normal sensation noted. Normal strength noted.       Hands: Neurological: She is alert and oriented to person, place, and time.  Skin: Skin is warm and dry.  Puncture wounds to the dorsal surface of bilateral hands with surrounding ecchymosis swelling and pain. Normal sensation to all fingers and tendons  are intact. No wrist pain bilaterally and 2+ radial pulse.  Psychiatric: She has a normal mood and affect. Her behavior is normal.    ED Course  Procedures (including critical care time) Medications - No data to display    COORDINATION OF CARE: 7:42 PM- Discussed treatment plan with pt which includes hand x-ray, pain medication, and antibiotics. Pt agrees to plan.    Labs Review Labs Reviewed - No data to display Imaging Review Dg Hand Complete Left  11/13/2012   CLINICAL DATA:  Bilateral hand lacerations.  EXAM: LEFT HAND - COMPLETE 3+ VIEW  COMPARISON:  None.  FINDINGS: There is no evidence of fracture or dislocation. There is no evidence of arthropathy or other focal bone abnormality. Soft tissues are unremarkable.  IMPRESSION: Negative.   Electronically Signed   By: Drusilla Kanner M.D.   On: 11/13/2012 20:17   Dg Hand Complete Right  11/13/2012   CLINICAL DATA:  Bilateral hand lacerations.  EXAM: RIGHT HAND - COMPLETE 3+ VIEW  COMPARISON:  None.  FINDINGS: There is no evidence of fracture or dislocation. There is no evidence of arthropathy or other focal bone abnormality. Soft tissues are unremarkable.  IMPRESSION: Negative.   Electronically Signed   By: Drusilla Kanner M.D.   On: 11/13/2012 20:18    EKG Interpretation   None       MDM   1. Dog bite, initial encounter     Patient with dog bite bilateral hands with puncture wounds but no extensive lacerations. Ecchymosis and swelling to both areas the plain film negative for underlying fracture. Neurovascularly intact with normal tendon function. Tetanus shot is up-to-date. Animal control has already been contacted in the interval can be watched. No suspicion for rabies at this time.  Patient given pain control and antibiotic      I personally performed the services described in this documentation, which was scribed in my presence.  The recorded information has been reviewed and considered.    Gwyneth Sprout,  MD 11/13/12 2156

## 2012-12-12 ENCOUNTER — Ambulatory Visit: Payer: Self-pay | Admitting: Emergency Medicine

## 2012-12-12 ENCOUNTER — Encounter: Payer: Self-pay | Admitting: Emergency Medicine

## 2012-12-12 VITALS — HR 88 | Temp 98.4°F | Resp 18 | Ht 62.0 in | Wt 188.0 lb

## 2012-12-12 DIAGNOSIS — S61452D Open bite of left hand, subsequent encounter: Secondary | ICD-10-CM

## 2012-12-12 DIAGNOSIS — S61451D Open bite of right hand, subsequent encounter: Secondary | ICD-10-CM

## 2012-12-12 MED ORDER — IBUPROFEN 800 MG PO TABS: 800.0000 mg | ORAL_TABLET | Freq: Three times a day (TID) | ORAL | Status: DC | PRN

## 2012-12-12 MED ORDER — DOXYCYCLINE HYCLATE 100 MG PO TABS: 100.0000 mg | ORAL_TABLET | Freq: Two times a day (BID) | ORAL | Status: DC

## 2012-12-12 NOTE — Progress Notes (Signed)
Subjective:    Patient ID: Samantha Greene, female    DOB: 1970/06/17, 42 y.o.   MRN: [id number]  HPI Comments: 42 yo female following up for recheck of bilateral dog bites on 11/13/12, was treated with Rocephin 1 gm and Augmentin. Advised R.I.C.E and follow up if no improvement after 2 days but patient failed to do so. She is still with pain, edema and redness. Denies fever or discharge. Most of sensation has returned bilaterally.    Current Outpatient Prescriptions on File Prior to Visit  Medication Sig Dispense Refill  . buPROPion (WELLBUTRIN XL) 300 MG 24 hr tablet Take 300 mg by mouth daily.      . Cholecalciferol (VITAMIN D) 2000 UNITS tablet Take 4,000 Units by mouth 2 (two) times daily. Pt takes Finest Nutrition Bone Health      . Cyanocobalamin (VITAMIN B 12) 100 MCG LOZG Take by mouth.      . DiphenhydrAMINE HCl, Sleep, (ZZZQUIL) 25 MG CAPS Take 1 capsule by mouth as needed.      . loratadine (CLARITIN) 10 MG tablet Take 10 mg by mouth daily as needed. For allergy symptoms       . Mefenamic Acid 250 MG CAPS Take 1 capsule (250 mg total) by mouth every 6 (six) hours as needed. Take 2 tabs initially  28 each  3  . OVER THE COUNTER MEDICATION Take 1 capsule by mouth at bedtime as needed. NatureMade Natural Sleep Aid. For sleep       . prenatal vitamin w/FE, FA (PRENATAL 1 + 1) 27-1 MG TABS Take 1 tablet by mouth daily.        . traMADol (ULTRAM) 50 MG tablet Take 1 tablet (50 mg total) by mouth every 6 (six) hours as needed for pain.  90 tablet  0  . amoxicillin-clavulanate (AUGMENTIN) 875-125 MG per tablet Take 1 tablet by mouth every 12 (twelve) hours.  14 tablet  0  . HYDROcodone-acetaminophen (VICODIN) 5-500 MG per tablet Take 1-2 tablets by mouth every 4 (four) hours as needed. For pain during period       . oxyCODONE-acetaminophen (PERCOCET/ROXICET) 5-325 MG per tablet Take 1 tablet by mouth every 6 (six) hours as needed for pain.  15 tablet  0   No current  facility-administered medications on file prior to visit.   AlLLERGIES Erythromycin and Flexeril  Past Medical History  Diagnosis Date  . Hypertension     no meds  . Depression     no meds  . Hyperlipidemia    Pulse 88  Temp(Src) 98.4 F (36.9 C) (Temporal)  Resp 18  Ht 5\' 2"  (1.575 m)  Wt 188 lb (85.276 kg)  BMI 34.38 kg/m2  LMP 11/25/2012   Review of Systems  Musculoskeletal: Positive for joint swelling.  Skin: Positive for color change.  Neurological: Positive for numbness.  All other systems reviewed and are negative.       Objective:   Physical Exam  Nursing note and vitals reviewed. Constitutional: She is oriented to person, place, and time. She appears well-developed and well-nourished.  Cardiovascular: Normal rate, regular rhythm, normal heart sounds and intact distal pulses.   Pulmonary/Chest: Effort normal and breath sounds normal.  Musculoskeletal: She exhibits edema and tenderness.  Unable to close fist, extreme sensitive to touch  Neurological: She is alert and oriented to person, place, and time.  Skin: Skin is warm and dry.  Healing wounds both hands with mild erythema.  Assessment & Plan:  Dog bites with treatment 11/13/12- Refer to hand specialist for further eval ASAP. Doxy 100 mg BID #28, IBuprofen 800mg  with food BID-TID to help with edema since cannot take Pred with Wellbutrin. R.I.C.E. Check CBC

## 2012-12-12 NOTE — Patient Instructions (Signed)
Animal Bite  Animal bite wounds can get infected. It is important to get proper medical treatment. Ask your doctor if you need a rabies shot.  HOME CARE   · Follow your doctor's instructions for taking care of your wound.  · Only take medicine as told by your doctor.  · Take your medicine (antibiotics) as told. Finish them even if you start to feel better.  · Keep all doctor visits as told.  You may need a tetanus shot if:   · You cannot remember when you had your last tetanus shot.  · You have never had a tetanus shot.  · The injury broke your skin.  If you need a tetanus shot and you choose not to have one, you may get tetanus. Sickness from tetanus can be serious.  GET HELP RIGHT AWAY IF:   · Your wound is warm, red, sore, or puffy (swollen).  · You notice yellowish-white fluid (pus) or a bad smell coming from the wound.  · You see a red line on the skin coming from the wound.  · You have a fever, chills, or you feel sick.  · You feel sick to your stomach (nauseous), or you throw up (vomit).  · Your pain does not go away, or it gets worse.  · You have trouble moving the injured part.  · You have questions or concerns.  MAKE SURE YOU:   · Understand these instructions.  · Will watch your condition.  · Will get help right away if you are not doing well or get worse.  Document Released: 01/17/2005 Document Revised: 04/11/2011 Document Reviewed: 09/08/2010  ExitCare® Patient Information ©2014 [employer].

## 2012-12-13 LAB — CBC WITH DIFFERENTIAL/PLATELET
Basophils Absolute: 0 10*3/uL (ref 0.0–0.1)
Eosinophils Relative: 1 % (ref 0–5)
HCT: 39.9 % (ref 36.0–46.0)
Hemoglobin: 13.9 g/dL (ref 12.0–15.0)
Lymphocytes Relative: 32 % (ref 12–46)
Lymphs Abs: 2.7 10*3/uL (ref 0.7–4.0)
MCHC: 34.8 g/dL (ref 30.0–36.0)
MCV: 95 fL (ref 78.0–100.0)
Monocytes Absolute: 0.8 10*3/uL (ref 0.1–1.0)
Monocytes Relative: 9 % (ref 3–12)
Neutrophils Relative %: 58 % (ref 43–77)
RDW: 13.3 % (ref 11.5–15.5)
WBC: 8.3 10*3/uL (ref 4.0–10.5)

## 2012-12-26 ENCOUNTER — Ambulatory Visit: Payer: BC Managed Care – PPO | Admitting: Obstetrics & Gynecology

## 2013-01-03 ENCOUNTER — Ambulatory Visit: Payer: BC Managed Care – PPO | Admitting: Obstetrics & Gynecology

## 2013-01-09 ENCOUNTER — Ambulatory Visit: Payer: Self-pay | Admitting: Obstetrics & Gynecology

## 2013-04-03 ENCOUNTER — Encounter: Payer: Self-pay | Admitting: Internal Medicine

## 2013-04-05 ENCOUNTER — Other Ambulatory Visit: Payer: Self-pay | Admitting: Emergency Medicine

## 2013-04-05 MED ORDER — BUPROPION HCL ER (XL) 300 MG PO TB24: 300.0000 mg | ORAL_TABLET | Freq: Every day | ORAL | Status: DC

## 2013-04-08 ENCOUNTER — Encounter: Payer: Self-pay | Admitting: Internal Medicine

## 2013-04-10 DIAGNOSIS — I10 Essential (primary) hypertension: Secondary | ICD-10-CM | POA: Insufficient documentation

## 2013-04-10 DIAGNOSIS — E785 Hyperlipidemia, unspecified: Secondary | ICD-10-CM | POA: Insufficient documentation

## 2013-04-10 DIAGNOSIS — F419 Anxiety disorder, unspecified: Secondary | ICD-10-CM | POA: Insufficient documentation

## 2013-04-10 DIAGNOSIS — F329 Major depressive disorder, single episode, unspecified: Secondary | ICD-10-CM | POA: Insufficient documentation

## 2013-04-10 DIAGNOSIS — F32A Depression, unspecified: Secondary | ICD-10-CM | POA: Insufficient documentation

## 2013-04-11 ENCOUNTER — Ambulatory Visit (INDEPENDENT_AMBULATORY_CARE_PROVIDER_SITE_OTHER): Payer: No Typology Code available for payment source | Admitting: Emergency Medicine

## 2013-04-11 ENCOUNTER — Encounter: Payer: Self-pay | Admitting: Emergency Medicine

## 2013-04-11 VITALS — BP 138/96 | HR 82 | Temp 98.2°F | Resp 18 | Ht 62.0 in | Wt 184.0 lb

## 2013-04-11 DIAGNOSIS — Z0000 Encounter for general adult medical examination without abnormal findings: Secondary | ICD-10-CM

## 2013-04-11 DIAGNOSIS — R5383 Other fatigue: Secondary | ICD-10-CM

## 2013-04-11 DIAGNOSIS — F329 Major depressive disorder, single episode, unspecified: Secondary | ICD-10-CM

## 2013-04-11 DIAGNOSIS — Z111 Encounter for screening for respiratory tuberculosis: Secondary | ICD-10-CM

## 2013-04-11 DIAGNOSIS — R5381 Other malaise: Secondary | ICD-10-CM

## 2013-04-11 DIAGNOSIS — R030 Elevated blood-pressure reading, without diagnosis of hypertension: Secondary | ICD-10-CM

## 2013-04-11 DIAGNOSIS — F32A Depression, unspecified: Secondary | ICD-10-CM

## 2013-04-11 LAB — CBC WITH DIFFERENTIAL/PLATELET
Basophils Absolute: 0 10*3/uL (ref 0.0–0.1)
Basophils Relative: 0 % (ref 0–1)
EOS ABS: 0.1 10*3/uL (ref 0.0–0.7)
Eosinophils Relative: 1 % (ref 0–5)
HCT: 39.9 % (ref 36.0–46.0)
HEMOGLOBIN: 13.6 g/dL (ref 12.0–15.0)
Lymphocytes Relative: 31 % (ref 12–46)
Lymphs Abs: 2 10*3/uL (ref 0.7–4.0)
MCH: 31.6 pg (ref 26.0–34.0)
MCHC: 34.1 g/dL (ref 30.0–36.0)
MCV: 92.8 fL (ref 78.0–100.0)
MONO ABS: 0.4 10*3/uL (ref 0.1–1.0)
MONOS PCT: 6 % (ref 3–12)
NEUTROS ABS: 3.9 10*3/uL (ref 1.7–7.7)
Neutrophils Relative %: 62 % (ref 43–77)
Platelets: 302 10*3/uL (ref 150–400)
RBC: 4.3 MIL/uL (ref 3.87–5.11)
RDW: 13.2 % (ref 11.5–15.5)
WBC: 6.3 10*3/uL (ref 4.0–10.5)

## 2013-04-11 LAB — BASIC METABOLIC PANEL WITH GFR
BUN: 12 mg/dL (ref 6–23)
CALCIUM: 9.1 mg/dL (ref 8.4–10.5)
CO2: 26 mEq/L (ref 19–32)
CREATININE: 0.91 mg/dL (ref 0.50–1.10)
Chloride: 102 mEq/L (ref 96–112)
GFR, EST AFRICAN AMERICAN: 89 mL/min
GFR, Est Non African American: 78 mL/min
Glucose, Bld: 78 mg/dL (ref 70–99)
Potassium: 4 mEq/L (ref 3.5–5.3)
Sodium: 137 mEq/L (ref 135–145)

## 2013-04-11 LAB — HEMOGLOBIN A1C
Hgb A1c MFr Bld: 5.3 % (ref <5.7)
Mean Plasma Glucose: 105 mg/dL (ref <117)

## 2013-04-11 LAB — LIPID PANEL
CHOL/HDL RATIO: 3.6 ratio
CHOLESTEROL: 230 mg/dL — AB (ref 0–200)
HDL: 64 mg/dL (ref >39)
LDL Cholesterol: 143 mg/dL — ABNORMAL HIGH (ref 0–99)
Triglycerides: 117 mg/dL (ref <150)
VLDL: 23 mg/dL (ref 0–40)

## 2013-04-11 MED ORDER — SERTRALINE HCL 50 MG PO TABS
50.0000 mg | ORAL_TABLET | Freq: Every day | ORAL | Status: DC
Start: 2013-04-11 — End: 2013-07-12
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
## 2013-04-11 NOTE — Patient Instructions (Addendum)
Fat and Cholesterol Control Diet Fat and cholesterol levels in your blood and organs are influenced by your diet. High levels of fat and cholesterol may lead to diseases of the heart, small and large blood vessels, gallbladder, liver, and pancreas. CONTROLLING FAT AND CHOLESTEROL WITH DIET Although exercise and lifestyle factors are important, your diet is key. That is because certain foods are known to raise cholesterol and others to lower it. The goal is to balance foods for their effect on cholesterol and more importantly, to replace saturated and trans fat with other types of fat, such as monounsaturated fat, polyunsaturated fat, and omega-3 fatty acids. On average, a person should consume no more than 15 to 17 g of saturated fat daily. Saturated and trans fats are considered "bad" fats, and they will raise LDL cholesterol. Saturated fats are primarily found in animal products such as meats, butter, and cream. However, that does not mean you need to give up all your favorite foods. Today, there are good tasting, low-fat, low-cholesterol substitutes for most of the things you like to eat. Choose low-fat or nonfat alternatives. Choose round or loin cuts of red meat. These types of cuts are lowest in fat and cholesterol. Chicken (without the skin), fish, veal, and ground turkey breast are great choices. Eliminate fatty meats, such as hot dogs and salami. Even shellfish have little or no saturated fat. Have a 3 oz (85 g) portion when you eat lean meat, poultry, or fish. Trans fats are also called "partially hydrogenated oils." They are oils that have been scientifically manipulated so that they are solid at room temperature resulting in a longer shelf life and improved taste and texture of foods in which they are added. Trans fats are found in stick margarine, some tub margarines, cookies, crackers, and baked goods.  When baking and cooking, oils are a great substitute for butter. The monounsaturated oils are  especially beneficial since it is believed they lower LDL and raise HDL. The oils you should avoid entirely are saturated tropical oils, such as coconut and palm.  Remember to eat a lot from food groups that are naturally free of saturated and trans fat, including fish, fruit, vegetables, beans, grains (barley, rice, couscous, bulgur wheat), and pasta (without cream sauces).  IDENTIFYING FOODS THAT LOWER FAT AND CHOLESTEROL  Soluble fiber may lower your cholesterol. This type of fiber is found in fruits such as apples, vegetables such as broccoli, potatoes, and carrots, legumes such as beans, peas, and lentils, and grains such as barley. Foods fortified with plant sterols (phytosterol) may also lower cholesterol. You should eat at least 2 g per day of these foods for a cholesterol lowering effect.  Read package labels to identify low-saturated fats, trans fat free, and low-fat foods at the supermarket. Select cheeses that have only 2 to 3 g saturated fat per ounce. Use a heart-healthy tub margarine that is free of trans fats or partially hydrogenated oil. When buying baked goods (cookies, crackers), avoid partially hydrogenated oils. Breads and muffins should be made from whole grains (whole-wheat or whole oat flour, instead of "flour" or "enriched flour"). Buy non-creamy canned soups with reduced salt and no added fats.  FOOD PREPARATION TECHNIQUES  Never deep-fry. If you must fry, either stir-fry, which uses very little fat, or use non-stick cooking sprays. When possible, broil, bake, or roast meats, and steam vegetables. Instead of putting butter or margarine on vegetables, use lemon and herbs, applesauce, and cinnamon (for squash and sweet potatoes). Use nonfat   yogurt, salsa, and low-fat dressings for salads.  LOW-SATURATED FAT / LOW-FAT FOOD SUBSTITUTES Meats / Saturated Fat (g)  Avoid: Steak, marbled (3 oz/85 g) / 11 g  Choose: Steak, lean (3 oz/85 g) / 4 g  Avoid: Hamburger (3 oz/85 g) / 7  g  Choose: Hamburger, lean (3 oz/85 g) / 5 g  Avoid: Ham (3 oz/85 g) / 6 g  Choose: Ham, lean cut (3 oz/85 g) / 2.4 g  Avoid: Chicken, with skin, dark meat (3 oz/85 g) / 4 g  Choose: Chicken, skin removed, dark meat (3 oz/85 g) / 2 g  Avoid: Chicken, with skin, light meat (3 oz/85 g) / 2.5 g  Choose: Chicken, skin removed, light meat (3 oz/85 g) / 1 g Dairy / Saturated Fat (g)  Avoid: Whole milk (1 cup) / 5 g  Choose: Low-fat milk, 2% (1 cup) / 3 g  Choose: Low-fat milk, 1% (1 cup) / 1.5 g  Choose: Skim milk (1 cup) / 0.3 g  Avoid: Hard cheese (1 oz/28 g) / 6 g  Choose: Skim milk cheese (1 oz/28 g) / 2 to 3 g  Avoid: Cottage cheese, 4% fat (1 cup) / 6.5 g  Choose: Low-fat cottage cheese, 1% fat (1 cup) / 1.5 g  Avoid: Ice cream (1 cup) / 9 g  Choose: Sherbet (1 cup) / 2.5 g  Choose: Nonfat frozen yogurt (1 cup) / 0.3 g  Choose: Frozen fruit bar / trace  Avoid: Whipped cream (1 tbs) / 3.5 g  Choose: Nondairy whipped topping (1 tbs) / 1 g Condiments / Saturated Fat (g)  Avoid: Mayonnaise (1 tbs) / 2 g  Choose: Low-fat mayonnaise (1 tbs) / 1 g  Avoid: Butter (1 tbs) / 7 g  Choose: Extra light margarine (1 tbs) / 1 g  Avoid: Coconut oil (1 tbs) / 11.8 g  Choose: Olive oil (1 tbs) / 1.8 g  Choose: Corn oil (1 tbs) / 1.7 g  Choose: Safflower oil (1 tbs) / 1.2 g  Choose: Sunflower oil (1 tbs) / 1.4 g  Choose: Soybean oil (1 tbs) / 2.4 g  Choose: Canola oil (1 tbs) / 1 g Document Released: 01/17/2005 Document Revised: 05/14/2012 Document Reviewed: 07/08/2010 ExitCare Patient Information 2014 [place]. We want weight loss that will last so you should lose 1-2 pounds a week.  THAT IS IT! Please pick THREE things a month to change. Once it is a habit check off the item. Then pick another three items off the list to become habits.  If you are already doing a habit on the list GREAT!  Cross that item off! o Don't drink your calories. Ie, alcohol, soda,  fruit juice, and sweet tea.  o Drink more water. Drink a glass when you feel hungry or before each meal.  o Eat breakfast - Complex carb and protein (likeDannon light and fit yogurt, oatmeal, fruit, eggs, [place] bacon). o Measure your cereal.  Eat no more than one cup a day. (ie [place]) o Eat an apple a day. o Add a vegetable a day. o Try a new vegetable a month. o Use Pam! Stop using oil or butter to cook. o Don't finish your plate or use smaller plates. o Share your dessert. o Eat sugar free Jello for dessert or frozen grapes. o Don't eat 2-3 hours before bed. o Switch to whole wheat bread, pasta, and brown rice. o Make healthier choices when you eat out. No fries! o Pick  baked chicken, NOT fried. o Don't forget to SLOW DOWN when you eat. It is not going anywhere.  o Take the stairs. o Park far away in the parking lot o [employer] (or weights) for 10 minutes while watching TV. o Walk at work for 10 minutes during break. o Walk outside 1 time a week with your friend, kids, dog, or significant other. o Start a walking group at church. o Walk the mall as much as you can tolerate.  o Keep a food diary. o Weigh yourself daily. o Walk for 15 minutes 3 days per week. o Cook at home more often and eat out less.  If life happens and you go back to old habits, it is okay.  Just start over. You can do it!   If you experience chest pain, get short of breath, or tired during the exercise, please stop immediately and inform your doctor.    Bad carbs also include fruit juice, alcohol, and sweet tea. These are empty calories that do not signal to your brain that you are full.   Please remember the good carbs are still carbs which convert into sugar. So please measure them out no more than 1/2-1 cup of rice, oatmeal, pasta, and beans.  Veggies are however free foods! Pile them on.   I like lean protein at every meal such as chicken, [place], pork chops, cottage cheese, etc. Just do not fry  these meats and please center your meal around vegetable, the meats should be a side dish.   No all fruit is created equal. Please see the list below, the fruit at the bottom is higher in sugars than the fruit at the top   Hypertension Hypertension is another name for high blood pressure. High blood pressure may mean that your heart needs to work harder to pump blood. Blood pressure consists of two numbers, which includes a higher number over a lower number (example: 110/72). HOME CARE   Make lifestyle changes as told by your doctor. This may include weight loss and exercise.  Take your blood pressure medicine every day.  Limit how much salt you use.  Stop smoking if you smoke.  Do not use drugs.  Talk to your doctor if you are using decongestants or birth control pills. These medicines might make blood pressure higher.  Females should not drink more than 1 alcoholic drink per day. Males should not drink more than 2 alcoholic drinks per day.  See your doctor as told. GET HELP RIGHT AWAY IF:   You have a blood pressure reading with a top number of 180 or higher.  You get a very bad headache.  You get blurred or changing vision.  You feel confused.  You feel weak, numb, or faint.  You get chest or belly (abdominal) pain.  You throw up (vomit).  You cannot breathe very well. MAKE SURE YOU:   Understand these instructions.  Will watch your condition.  Will get help right away if you are not doing well or get worse. Document Released: 07/06/2007 Document Revised: 04/11/2011 Document Reviewed: 07/06/2007 [facility] Patient Information 2014 [place], [place].

## 2013-04-12 LAB — MICROALBUMIN / CREATININE URINE RATIO
CREATININE, URINE: 58 mg/dL
Microalb Creat Ratio: 8.6 mg/g (ref 0.0–30.0)
Microalb, Ur: 0.5 mg/dL (ref 0.00–1.89)

## 2013-04-12 LAB — FOLATE RBC: RBC Folate: 621 ng/mL (ref >280)

## 2013-04-12 LAB — HEPATIC FUNCTION PANEL
ALBUMIN: 4.6 g/dL (ref 3.5–5.2)
ALT: 17 U/L (ref 0–35)
AST: 20 U/L (ref 0–37)
Alkaline Phosphatase: 56 U/L (ref 39–117)
BILIRUBIN DIRECT: 0.1 mg/dL (ref 0.0–0.3)
BILIRUBIN TOTAL: 0.6 mg/dL (ref 0.2–1.2)
Indirect Bilirubin: 0.5 mg/dL (ref 0.2–1.2)
Total Protein: 7 g/dL (ref 6.0–8.3)

## 2013-04-12 LAB — URINALYSIS, ROUTINE W REFLEX MICROSCOPIC
Bilirubin Urine: NEGATIVE
Glucose, UA: NEGATIVE mg/dL
HGB URINE DIPSTICK: NEGATIVE
Leukocytes, UA: NEGATIVE
NITRITE: NEGATIVE
PH: 6.5 (ref 5.0–8.0)
Protein, ur: NEGATIVE mg/dL
Specific Gravity, Urine: 1.009 (ref 1.005–1.030)
Urobilinogen, UA: 0.2 mg/dL (ref 0.0–1.0)

## 2013-04-12 LAB — VITAMIN B12: Vitamin B-12: 1017 pg/mL — ABNORMAL HIGH (ref 211–911)

## 2013-04-12 LAB — INSULIN, FASTING: Insulin fasting, serum: 15 u[IU]/mL (ref 3–28)

## 2013-04-12 LAB — TSH: TSH: 1.961 u[IU]/mL (ref 0.350–4.500)

## 2013-04-12 LAB — IRON AND TIBC
%SAT: 38 % (ref 20–55)
Iron: 141 ug/dL (ref 42–145)
TIBC: 376 ug/dL (ref 250–470)
UIBC: 235 ug/dL (ref 125–400)

## 2013-04-12 LAB — MAGNESIUM: Magnesium: 2.3 mg/dL (ref 1.5–2.5)

## 2013-04-12 LAB — VITAMIN D 25 HYDROXY (VIT D DEFICIENCY, FRACTURES): VIT D 25 HYDROXY: 94 ng/mL — AB (ref 30–89)

## 2013-04-15 LAB — TB SKIN TEST
Induration: 0 mm
TB Skin Test: NEGATIVE

## 2013-04-17 NOTE — Progress Notes (Signed)
LMOM TO CALL TO SCHEDULE  

## 2013-04-25 ENCOUNTER — Ambulatory Visit (INDEPENDENT_AMBULATORY_CARE_PROVIDER_SITE_OTHER): Payer: No Typology Code available for payment source | Admitting: Emergency Medicine

## 2013-04-25 VITALS — BP 136/92 | HR 72 | Temp 98.2°F | Resp 16 | Wt 182.6 lb

## 2013-04-25 DIAGNOSIS — R109 Unspecified abdominal pain: Secondary | ICD-10-CM

## 2013-04-25 DIAGNOSIS — M25579 Pain in unspecified ankle and joints of unspecified foot: Secondary | ICD-10-CM

## 2013-04-25 DIAGNOSIS — R238 Other skin changes: Secondary | ICD-10-CM

## 2013-04-25 DIAGNOSIS — Z1239 Encounter for other screening for malignant neoplasm of breast: Secondary | ICD-10-CM

## 2013-04-25 DIAGNOSIS — R239 Unspecified skin changes: Secondary | ICD-10-CM

## 2013-04-25 NOTE — Progress Notes (Signed)
Subjective:    Patient ID: Samantha Greene, female    DOB: [birth year], 43 y.o.   MRN: [id number]  HPI Comments: 43 yo female with recurrent right ankle injuries. Last bad injury was in June and saw foot doctor. She notes no relief with ankle braces or exercises and wants further evaluation.  She has had epigastric pain for over 1 year on/ off. She notes pain when occurs is constant for several days in a row then disipates.She denies radiation. She tried omeprazole w/o relief of pain. She only has reflux if eats lots of meat. She occasionally notes gas with discomfort. She is trying to lose weight. She has not had any scans for evaluation. She notes rare constipation/ nausea.   She had mole with color variations at last OV and has decided to have removed to be evaluated for possible atypia. She is uncertain of how long mole has been changing. Mole is on left shoulder.   Ankle Injury   Abdominal Pain Associated symptoms include arthralgias.   Current Outpatient Prescriptions on File Prior to Visit  Medication Sig Dispense Refill  . Cholecalciferol (VITAMIN D) 2000 UNITS tablet Take 10,000 Units by mouth daily. Pt takes Finest Nutrition Bone Health      . Cyanocobalamin (VITAMIN B 12) 100 MCG LOZG Take by mouth.      . DiphenhydrAMINE HCl, Sleep, (ZZZQUIL) 25 MG CAPS Take 1 capsule by mouth as needed.      Marland Kitchen HYDROcodone-acetaminophen (VICODIN) 5-500 MG per tablet Take 1-2 tablets by mouth every 4 (four) hours as needed. For pain during period       . ibuprofen (ADVIL,MOTRIN) 800 MG tablet Take 1 tablet (800 mg total) by mouth every 8 (eight) hours as needed.  30 tablet  0  . loratadine (CLARITIN) 10 MG tablet Take 10 mg by mouth daily as needed. For allergy symptoms       . Mefenamic Acid 250 MG CAPS Take 1 capsule (250 mg total) by mouth every 6 (six) hours as needed. Take 2 tabs initially  28 each  3  . OVER THE COUNTER MEDICATION Take 1 capsule by mouth at bedtime as needed. NatureMade  Natural Sleep Aid. For sleep       . prenatal vitamin w/FE, FA (PRENATAL 1 + 1) 27-1 MG TABS Take 1 tablet by mouth daily.        . sertraline (ZOLOFT) 50 MG tablet Take 1 tablet (50 mg total) by mouth daily.  30 tablet  2  . traMADol (ULTRAM) 50 MG tablet Take 1 tablet (50 mg total) by mouth every 6 (six) hours as needed for pain.  90 tablet  0   No current facility-administered medications on file prior to visit.   Allergies  Allergen Reactions  . Erythromycin Nausea Only and Other (See Comments)    Stomach cramping  . Flexeril [Cyclobenzaprine Hcl] Other (See Comments)    "don't like the way I feel"  . Metformin And Related     GI Upset   Past Medical History  Diagnosis Date  . Depression     no meds  . Hyperlipidemia   . Hypertension     no meds  . Anxiety        Review of Systems  Gastrointestinal: Positive for abdominal pain.  Musculoskeletal: Positive for arthralgias.  Skin: Positive for color change.  BP 136/92  Pulse 72  Temp(Src) 98.2 F (36.8 C)  Resp 16  Wt 182 lb 9.6 oz (82.827  kg)  LMP 03/15/2013      Objective:   Physical Exam  Nursing note and vitals reviewed. Constitutional: She is oriented to person, place, and time. She appears well-developed and well-nourished. No distress.  HENT:  Head: Normocephalic and atraumatic.  Right Ear: External ear normal.  Left Ear: External ear normal.  Nose: Nose normal.  Mouth/Throat: Oropharynx is clear and moist.  Eyes: Conjunctivae and EOM are normal.  Neck: Normal range of motion. Neck supple. No JVD present. No thyromegaly present.  Cardiovascular: Normal rate, regular rhythm, normal heart sounds and intact distal pulses.   Pulmonary/Chest: Effort normal and breath sounds normal.  Abdominal: Soft. Bowel sounds are normal. She exhibits no distension. There is tenderness.  Minimal RUQ/ epigastric  Musculoskeletal: Normal range of motion. She exhibits no edema and no tenderness.  No obvious deformity or  Difficulty with ROM of ankle  Lymphadenopathy:    She has no cervical adenopathy.  Neurological: She is alert and oriented to person, place, and time. No cranial nerve deficit.  Skin: Skin is warm and dry. No rash noted. No erythema. No pallor.  Left shoulder with 3-4 mm dark to medium flat nevi with variations in pigment/ borders  Psychiatric: She has a normal mood and affect. Her behavior is normal. Judgment and thought content normal.          Assessment & Plan:  1. Ankle pain- ref ortho  2. Abdomen pain- U/S to evaluate  3. Skin changes-  Irreg/ atypical moles- Verbal permission obtained. Area prepped with alcohol. 1% lidocaine used at each area approx 1 cc ea. Shave excision performed with clear borders obtained and sent for pathology. Area bandaged with Neosporin/ guaze/ paper tape. Wound hygiene given. SX of infection explained pt w/c with any concerns.

## 2013-04-25 NOTE — Patient Instructions (Signed)
Abdominal Pain, Adult Many things can cause abdominal pain. Usually, abdominal pain is not caused by a disease and will improve without treatment. It can often be observed and treated at home. Your health care provider will do a physical exam and possibly order blood tests and X-rays to help determine the seriousness of your pain. However, in many cases, more time must pass before a clear cause of the pain can be found. Before that point, your health care provider may not know if you need more testing or further treatment. HOME CARE INSTRUCTIONS  Monitor your abdominal pain for any changes. The following actions may help to alleviate any discomfort you are experiencing:  Only take over-the-counter or prescription medicines as directed by your health care provider.  Do not take laxatives unless directed to do so by your health care provider.  Try a clear liquid diet (broth, tea, or water) as directed by your health care provider. Slowly move to a bland diet as tolerated. SEEK MEDICAL CARE IF:  You have unexplained abdominal pain.  You have abdominal pain associated with nausea or diarrhea.  You have pain when you urinate or have a bowel movement.  You experience abdominal pain that wakes you in the night.  You have abdominal pain that is worsened or improved by eating food.  You have abdominal pain that is worsened with eating fatty foods. SEEK IMMEDIATE MEDICAL CARE IF:   Your pain does not go away within 2 hours.  You have a fever.  You keep throwing up (vomiting).  Your pain is felt only in portions of the abdomen, such as the right side or the left lower portion of the abdomen.  You pass bloody or black tarry stools. MAKE SURE YOU:  Understand these instructions.   Will watch your condition.   Will get help right away if you are not doing well or get worse.  Document Released: 10/27/2004 Document Revised: 11/07/2012 Document Reviewed: 09/26/2012 [facility] Patient  Information 2014 [place], [place]. Ankle Exercises for Rehabilitation Following ankle injuries, it is as important to follow your caregivers instructions for regaining full use of your ankle as it was to follow the initial treatment plan following the injury. The following are some suggestions for exercises and treatment, which can be done to help you regain full use of your ankle as soon as possible.  Follow all instructions regarding physical therapy.  Before exercising, it may be helpful to use heat on the muscles or joint being exercised. This loosens up the muscles and tendons (cord like structure) and decreases chances of injury during your exercises. If this is not possible just begin your exercises slowly to gradually warm up.  Stand on your toes several times per dayto strengthen the calf muscles. These are the muscles in the back of your leg between the knee and the heel. The cord you can feel just above the heel is the Achilles tendon. Rise up on your toes several times repeating this three to four times per day. Do not exercise to the point of pain. If pain starts to develop, decrease the exercise until you are comfortable again.  Do range of motion exercises. This means moving the ankle in all directions. Practice writing the alphabet with your toes in the air. Do not increase beyond a range that is comfortable.  Increase the strength of the muscles in the front of your leg by raising your toes and foot straight up in the air. Repeat this exercise as you did  the calf exercise with the same warnings. This also help to stretch your muscles.  Stretch your calf muscles also by leaning against a wall with your hands in front of you. Put your feet a few feet from the wall and bend your knees until you feel the muscles in your calves become tight.  After exercising it may be helpful to put ice on the ankle to prevent swelling and improve rehabilitation. This may be done for 15 to 20 minutes  following your exercises. If exercising is being done in the work place, this may not always be possible.  Taping an ankle injury may be helpful to give added support following an injury. It also may help prevent re-injury. This may be true if you are in training or in a conditioning program. You and your caregiver can decide on the best course of action to follow. Document Released: 01/15/2000 Document Revised: 04/11/2011 Document Reviewed: 01/12/2008 [facility] Patient Information 2014 [place], [place].

## 2013-04-26 ENCOUNTER — Other Ambulatory Visit: Payer: PRIVATE HEALTH INSURANCE

## 2013-04-29 ENCOUNTER — Telehealth: Payer: Self-pay

## 2013-04-29 ENCOUNTER — Encounter: Payer: Self-pay | Admitting: Emergency Medicine

## 2013-04-29 NOTE — Telephone Encounter (Signed)
Pt called asking to speak w/ me regarding referrals. Pt needs to call insurance to check ortho specialists in network and call me back w/ list. Pt missed imaging appt 04-26-13. I will call to resched. Pt aware.

## 2013-05-07 ENCOUNTER — Ambulatory Visit
Admission: RE | Admit: 2013-05-07 | Discharge: 2013-05-07 | Disposition: A | Discharge status: Home / Self Care | Payer: No Typology Code available for payment source | Source: Ambulatory Visit | Attending: Emergency Medicine | Admitting: Emergency Medicine

## 2013-05-07 DIAGNOSIS — R109 Unspecified abdominal pain: Secondary | ICD-10-CM

## 2013-05-10 ENCOUNTER — Telehealth: Payer: Self-pay

## 2013-05-10 NOTE — Telephone Encounter (Signed)
Pt called requesting U/S results. Pt aware of U/S results & will call if she has any symptoms.

## 2013-05-15 ENCOUNTER — Ambulatory Visit
Admission: RE | Admit: 2013-05-15 | Discharge: 2013-05-15 | Disposition: A | Discharge status: Home / Self Care | Payer: No Typology Code available for payment source | Source: Ambulatory Visit | Attending: Emergency Medicine | Admitting: Emergency Medicine

## 2013-05-15 DIAGNOSIS — Z1239 Encounter for other screening for malignant neoplasm of breast: Secondary | ICD-10-CM

## 2013-07-12 ENCOUNTER — Other Ambulatory Visit: Payer: Self-pay | Admitting: Emergency Medicine

## 2013-07-25 ENCOUNTER — Ambulatory Visit (INDEPENDENT_AMBULATORY_CARE_PROVIDER_SITE_OTHER): Payer: No Typology Code available for payment source | Admitting: Emergency Medicine

## 2013-07-25 ENCOUNTER — Encounter: Payer: Self-pay | Admitting: Emergency Medicine

## 2013-07-25 VITALS — BP 144/98 | HR 62 | Temp 98.2°F | Resp 18 | Ht 62.0 in | Wt 185.0 lb

## 2013-07-25 DIAGNOSIS — R5383 Other fatigue: Secondary | ICD-10-CM

## 2013-07-25 DIAGNOSIS — R030 Elevated blood-pressure reading, without diagnosis of hypertension: Secondary | ICD-10-CM

## 2013-07-25 DIAGNOSIS — E669 Obesity, unspecified: Secondary | ICD-10-CM

## 2013-07-25 DIAGNOSIS — R5381 Other malaise: Secondary | ICD-10-CM

## 2013-07-25 DIAGNOSIS — E782 Mixed hyperlipidemia: Secondary | ICD-10-CM

## 2013-07-25 LAB — LIPID PANEL
CHOL/HDL RATIO: 4 ratio
CHOLESTEROL: 249 mg/dL — AB (ref 0–200)
HDL: 62 mg/dL (ref >39)
LDL Cholesterol: 165 mg/dL — ABNORMAL HIGH (ref 0–99)
TRIGLYCERIDES: 110 mg/dL (ref <150)
VLDL: 22 mg/dL (ref 0–40)

## 2013-07-25 LAB — CBC WITH DIFFERENTIAL/PLATELET
BASOS PCT: 0 % (ref 0–1)
Basophils Absolute: 0 10*3/uL (ref 0.0–0.1)
EOS ABS: 0.1 10*3/uL (ref 0.0–0.7)
EOS PCT: 2 % (ref 0–5)
HCT: 39.3 % (ref 36.0–46.0)
Hemoglobin: 13.4 g/dL (ref 12.0–15.0)
Lymphocytes Relative: 37 % (ref 12–46)
Lymphs Abs: 2 10*3/uL (ref 0.7–4.0)
MCH: 31.5 pg (ref 26.0–34.0)
MCHC: 34.1 g/dL (ref 30.0–36.0)
MCV: 92.3 fL (ref 78.0–100.0)
MONOS PCT: 10 % (ref 3–12)
Monocytes Absolute: 0.5 10*3/uL (ref 0.1–1.0)
Neutro Abs: 2.7 10*3/uL (ref 1.7–7.7)
Neutrophils Relative %: 51 % (ref 43–77)
PLATELETS: 314 10*3/uL (ref 150–400)
RBC: 4.26 MIL/uL (ref 3.87–5.11)
RDW: 13.1 % (ref 11.5–15.5)
WBC: 5.3 10*3/uL (ref 4.0–10.5)

## 2013-07-25 LAB — BASIC METABOLIC PANEL WITH GFR
BUN: 13 mg/dL (ref 6–23)
CO2: 25 mEq/L (ref 19–32)
CREATININE: 0.87 mg/dL (ref 0.50–1.10)
Calcium: 9.4 mg/dL (ref 8.4–10.5)
Chloride: 104 mEq/L (ref 96–112)
GFR, Est Non African American: 82 mL/min
Glucose, Bld: 76 mg/dL (ref 70–99)
Potassium: 4 mEq/L (ref 3.5–5.3)
Sodium: 140 mEq/L (ref 135–145)

## 2013-07-25 LAB — HEPATIC FUNCTION PANEL
ALT: 23 U/L (ref 0–35)
AST: 22 U/L (ref 0–37)
Albumin: 4.5 g/dL (ref 3.5–5.2)
Alkaline Phosphatase: 49 U/L (ref 39–117)
BILIRUBIN DIRECT: 0.1 mg/dL (ref 0.0–0.3)
BILIRUBIN TOTAL: 0.6 mg/dL (ref 0.2–1.2)
Indirect Bilirubin: 0.5 mg/dL (ref 0.2–1.2)
Total Protein: 7.1 g/dL (ref 6.0–8.3)

## 2013-07-25 LAB — TSH: TSH: 1.701 u[IU]/mL (ref 0.350–4.500)

## 2013-07-25 MED ORDER — SERTRALINE HCL 50 MG PO TABS: ORAL_TABLET | ORAL | Status: DC

## 2013-07-25 MED ORDER — AMPHETAMINE-DEXTROAMPHETAMINE 10 MG PO TABS: 10.0000 mg | ORAL_TABLET | Freq: Two times a day (BID) | ORAL | Status: DC

## 2013-07-25 NOTE — Patient Instructions (Signed)

## 2013-07-25 NOTE — Progress Notes (Signed)
Subjective:    Patient ID: Samantha Greene, female    DOB: 24-Sep-1970, 43 y.o.   MRN: [id number]  HPI Comments: 43 yo WF presents for 3 month F/U for HTN, Cholesterol, Pre-Dm, D. Deficient. She is stressing and not eating as healthy. She is trying to increase activity. She is having more trouble focusing with stress. She is sleeping better with melatonin but fitbit shows interrupted sleep pattern. She notes BP is up a little with stress.   She only takes Tramadol for menses cramps and tries not to use at all. WBC             6.3   04/11/2013 HGB            13.6   04/11/2013 HCT            39.9   04/11/2013 PLT             302   04/11/2013 GLUCOSE          78   04/11/2013 CHOL            230   04/11/2013 TRIG            117   04/11/2013 HDL              64   04/11/2013 LDLCALC         143   04/11/2013 ALT              17   04/11/2013 AST              20   04/11/2013 NA              137   04/11/2013 K               4.0   04/11/2013 CL              102   04/11/2013 CREATININE     0.91   04/11/2013 BUN              12   04/11/2013 CO2              26   04/11/2013 TSH           1.961   04/11/2013 HGBA1C          5.3   04/11/2013 MICROALBUR     0.50   04/11/2013   Anxiety Symptoms include decreased concentration and nervous/anxious behavior.       Medication List       This list is accurate as of: 07/25/13 10:48 AM.  Always use your most recent med list.               HYDROcodone-acetaminophen 5-500 MG per tablet  Commonly known as:  VICODIN  Take 1-2 tablets by mouth every 4 (four) hours as needed. For pain during period     ibuprofen 800 MG tablet  Commonly known as:  ADVIL,MOTRIN  Take 1 tablet (800 mg total) by mouth every 8 (eight) hours as needed.     loratadine 10 MG tablet  Commonly known as:  CLARITIN  Take 10 mg by mouth daily as needed. For allergy symptoms     Mefenamic Acid 250 MG Caps  Take 1 capsule (250 mg total) by mouth every 6 (six) hours as needed. Take 2 tabs  initially     Melatonin 3 MG Tabs  Take by mouth. Takes 1/2 tablet qhs  OVER THE COUNTER MEDICATION  Take 1 capsule by mouth at bedtime as needed. [employer] Natural Sleep Aid. For sleep     prenatal vitamin w/FE, FA 27-1 MG Tabs tablet  Take 1 tablet by mouth daily.     sertraline 50 MG tablet  Commonly known as:  ZOLOFT  TAKE 1 TABLET (50 MG TOTAL) BY MOUTH DAILY.     traMADol 50 MG tablet  Commonly known as:  ULTRAM  Take 1 tablet (50 mg total) by mouth every 6 (six) hours as needed for pain.     Vitamin B 12 100 MCG Lozg  Take by mouth.     Vitamin D 2000 UNITS tablet  Take 10,000 Units by mouth daily. Pt takes [employer] Bone Health       Allergies  Allergen Reactions  . Erythromycin Nausea Only and Other (See Comments)    Stomach cramping  . Flexeril [Cyclobenzaprine Hcl] Other (See Comments)    "don't like the way I feel"  . Metformin And Related     GI Upset   Past Medical History  Diagnosis Date  . Depression     no meds  . Hyperlipidemia   . Hypertension     no meds  . Anxiety       Review of Systems  Skin: Positive for color change.  Psychiatric/Behavioral: Positive for decreased concentration. The patient is nervous/anxious.   All other systems reviewed and are negative.  BP 144/98  Pulse 62  Temp(Src) 98.2 F (36.8 C) (Temporal)  Resp 18  Ht 5\' 2"  (1.575 m)  Wt 185 lb (83.915 kg)  BMI 33.83 kg/m2  LMP 07/23/2013     Objective:   Physical Exam  Nursing note and vitals reviewed. Constitutional: She is oriented to person, place, and time. She appears well-developed and well-nourished. No distress.  HENT:  Head: Normocephalic and atraumatic.    Right Ear: External ear normal.  Left Ear: External ear normal.  Nose: Nose normal.  Mouth/Throat: Oropharynx is clear and moist.  Eyes: Conjunctivae and EOM are normal.  Neck: Normal range of motion. Neck supple. No JVD present. No thyromegaly present.  Cardiovascular: Normal  rate, regular rhythm, normal heart sounds and intact distal pulses.   Pulmonary/Chest: Effort normal and breath sounds normal.  Abdominal: Soft. Bowel sounds are normal. She exhibits no distension and no mass. There is no tenderness. There is no rebound and no guarding.  Musculoskeletal: Normal range of motion. She exhibits no edema and no tenderness.  Lymphadenopathy:    She has no cervical adenopathy.  Neurological: She is alert and oriented to person, place, and time. No cranial nerve deficit.  Skin: Skin is warm and dry. No rash noted. No erythema. No pallor.  SCaling area on right cheek 3 mm and left forehead 2 mm  Psychiatric: She has a normal mood and affect. Her behavior is normal. Judgment and thought content normal.          Assessment & Plan:  1. ? ADD vs anxiety- Trial of Adderall 10 mg. DO not take Tramadol with Adderall.   2. Fatigue- check labs, increase activity and H2O, patient w/c when ready to try to get sleep study ordered, cannot afford currently.  3. 3 month F/U for HTN, Cholesterol, Pre-Dm, D. Deficient. Needs healthy diet, cardio QD and obtain healthy weight. Check Labs, Check BP if >130/80 call office. Wants to wait on HTN RX to see if decreased stress will help  4. Skin changes- Will schedule  removal at next OV

## 2013-07-26 ENCOUNTER — Other Ambulatory Visit: Payer: Self-pay | Admitting: Emergency Medicine

## 2013-07-26 LAB — INSULIN, FASTING: Insulin fasting, serum: 11 u[IU]/mL (ref 3–28)

## 2013-07-26 MED ORDER — PRAVASTATIN SODIUM 40 MG PO TABS: 40.0000 mg | ORAL_TABLET | Freq: Every evening | ORAL | Status: DC

## 2013-09-18 ENCOUNTER — Encounter: Payer: Self-pay | Admitting: Physician Assistant

## 2013-09-18 ENCOUNTER — Ambulatory Visit (INDEPENDENT_AMBULATORY_CARE_PROVIDER_SITE_OTHER): Payer: No Typology Code available for payment source | Admitting: Physician Assistant

## 2013-09-18 VITALS — BP 128/94 | HR 84 | Temp 99.8°F | Resp 18 | Ht 62.0 in | Wt 189.0 lb

## 2013-09-18 DIAGNOSIS — J209 Acute bronchitis, unspecified: Secondary | ICD-10-CM

## 2013-09-18 MED ORDER — AMOXICILLIN-POT CLAVULANATE 875-125 MG PO TABS: 1.0000 | ORAL_TABLET | Freq: Two times a day (BID) | ORAL | Status: DC

## 2013-09-18 MED ORDER — IPRATROPIUM-ALBUTEROL 0.5-2.5 (3) MG/3ML IN SOLN
3.0000 mL | Freq: Once | RESPIRATORY_TRACT | Status: AC
  Administered 2013-09-18: 3 mL via RESPIRATORY_TRACT

## 2013-09-18 MED ORDER — PREDNISONE 20 MG PO TABS: ORAL_TABLET | ORAL | Status: DC

## 2013-09-18 NOTE — Progress Notes (Signed)
   Subjective:    Patient ID: Samantha Greene, female    DOB: [birth year], 43 y.o.   MRN: [id number]  Cough This is a new problem. Episode onset: 1 week. The problem has been unchanged. The cough is productive of purulent sputum. Associated symptoms include chills, a fever, nasal congestion, postnasal drip, rhinorrhea, a sore throat, shortness of breath and wheezing. Pertinent negatives include no chest pain, ear congestion, ear pain, headaches, heartburn, hemoptysis, myalgias, rash, sweats or weight loss. The symptoms are aggravated by lying down. Treatments tried: corocidin HBP. The treatment provided mild relief. There is no history of COPD.     Temperature 99.8 F (37.7 C), temperature source Temporal, resp. rate 18, height 5\' 2"  (1.575 m), weight 189 lb (85.73 kg), last menstrual period 09/17/2013.  Review of Systems  Constitutional: Positive for fever, chills and appetite change. Negative for weight loss, diaphoresis, activity change and fatigue.  HENT: Positive for postnasal drip, rhinorrhea, sinus pressure and sore throat. Negative for congestion, dental problem, drooling, ear pain, sneezing, tinnitus and trouble swallowing.   Respiratory: Positive for cough, shortness of breath and wheezing. Negative for hemoptysis.   Cardiovascular: Negative for chest pain, palpitations and leg swelling.  Gastrointestinal: Negative for heartburn.  Musculoskeletal: Negative for myalgias.  Skin: Negative for rash.  Neurological: Negative for headaches.       Objective:   Physical Exam  Constitutional: She is oriented to person, place, and time. She appears well-developed and well-nourished.  HENT:  Head: Normocephalic and atraumatic.  Right Ear: External ear normal.  Left Ear: External ear normal.  Nose: Nose normal.  Mouth/Throat: Oropharynx is clear and moist.  Eyes: Conjunctivae are normal. Pupils are equal, round, and reactive to light.  Neck: Normal range of motion. Neck supple.   Cardiovascular: Normal rate and regular rhythm.   Pulmonary/Chest: Effort normal. No respiratory distress. She has wheezes. She has no rales. She exhibits no tenderness.  Decreased breath sounds/tight  Abdominal: Soft. Bowel sounds are normal.  Lymphadenopathy:    She has no cervical adenopathy.  Neurological: She is alert and oriented to person, place, and time.  Skin: Skin is warm and dry.       Assessment & Plan:  1. Acute bronchitis, unspecified organism [466.0] - amoxicillin-clavulanate (AUGMENTIN) 875-125 MG per tablet; Take 1 tablet by mouth every 12 (twelve) hours.  Dispense: 20 tablet; Refill: 0 - ipratropium-albuterol (DUONEB) 0.5-2.5 (3) MG/3ML nebulizer solution 3 mL; Take 3 mLs by nebulization once. - predniSONE (DELTASONE) 20 MG tablet; Take one pill two times daily for 3 days, take one pill daily for 4 days.  Dispense: 10 tablet; Refill: 0

## 2013-09-18 NOTE — Patient Instructions (Signed)

## 2013-10-14 ENCOUNTER — Telehealth: Payer: Self-pay | Admitting: Internal Medicine

## 2013-10-14 NOTE — Telephone Encounter (Signed)
Samantha Greene,  PATIENT RETURNED YOUR CALL  Thank you, Katrina Webb [facility] [phone] Fax [phone]

## 2013-10-25 ENCOUNTER — Encounter: Payer: Self-pay | Admitting: Physician Assistant

## 2013-11-15 ENCOUNTER — Other Ambulatory Visit: Payer: Self-pay

## 2014-01-27 ENCOUNTER — Encounter: Payer: Self-pay | Admitting: *Deleted

## 2014-02-06 ENCOUNTER — Ambulatory Visit: Payer: Self-pay | Admitting: Physician Assistant

## 2014-02-06 ENCOUNTER — Other Ambulatory Visit: Payer: Self-pay | Admitting: Physician Assistant

## 2014-02-06 ENCOUNTER — Encounter: Payer: Self-pay | Admitting: Physician Assistant

## 2014-02-06 ENCOUNTER — Ambulatory Visit (INDEPENDENT_AMBULATORY_CARE_PROVIDER_SITE_OTHER): Payer: BLUE CROSS/BLUE SHIELD | Admitting: Physician Assistant

## 2014-02-06 VITALS — BP 158/106 | HR 74 | Temp 98.6°F | Resp 18 | Ht 62.0 in | Wt 195.0 lb

## 2014-02-06 DIAGNOSIS — I10 Essential (primary) hypertension: Secondary | ICD-10-CM

## 2014-02-06 DIAGNOSIS — E785 Hyperlipidemia, unspecified: Secondary | ICD-10-CM

## 2014-02-06 DIAGNOSIS — F329 Major depressive disorder, single episode, unspecified: Secondary | ICD-10-CM

## 2014-02-06 DIAGNOSIS — Z79899 Other long term (current) drug therapy: Secondary | ICD-10-CM

## 2014-02-06 DIAGNOSIS — F32A Depression, unspecified: Secondary | ICD-10-CM

## 2014-02-06 LAB — COMPLETE METABOLIC PANEL WITH GFR
ALT: 16 U/L (ref 0–35)
AST: 19 U/L (ref 0–37)
Albumin: 4.2 g/dL (ref 3.5–5.2)
Alkaline Phosphatase: 58 U/L (ref 39–117)
BILIRUBIN TOTAL: 0.4 mg/dL (ref 0.2–1.2)
BUN: 13 mg/dL (ref 6–23)
CHLORIDE: 104 meq/L (ref 96–112)
CO2: 25 meq/L (ref 19–32)
CREATININE: 0.85 mg/dL (ref 0.50–1.10)
Calcium: 9.1 mg/dL (ref 8.4–10.5)
GFR, Est Non African American: 84 mL/min
Glucose, Bld: 83 mg/dL (ref 70–99)
POTASSIUM: 4.4 meq/L (ref 3.5–5.3)
SODIUM: 137 meq/L (ref 135–145)
Total Protein: 7 g/dL (ref 6.0–8.3)

## 2014-02-06 LAB — CBC WITH DIFFERENTIAL/PLATELET
Basophils Absolute: 0 10*3/uL (ref 0.0–0.1)
Basophils Relative: 0 % (ref 0–1)
EOS ABS: 0.1 10*3/uL (ref 0.0–0.7)
Eosinophils Relative: 2 % (ref 0–5)
HCT: 40.7 % (ref 36.0–46.0)
Hemoglobin: 13.9 g/dL (ref 12.0–15.0)
LYMPHS ABS: 2 10*3/uL (ref 0.7–4.0)
Lymphocytes Relative: 38 % (ref 12–46)
MCH: 31.4 pg (ref 26.0–34.0)
MCHC: 34.2 g/dL (ref 30.0–36.0)
MCV: 92.1 fL (ref 78.0–100.0)
MPV: 9.3 fL (ref 8.6–12.4)
Monocytes Absolute: 0.5 10*3/uL (ref 0.1–1.0)
Monocytes Relative: 9 % (ref 3–12)
Neutro Abs: 2.7 10*3/uL (ref 1.7–7.7)
Neutrophils Relative %: 51 % (ref 43–77)
Platelets: 307 10*3/uL (ref 150–400)
RBC: 4.42 MIL/uL (ref 3.87–5.11)
RDW: 13 % (ref 11.5–15.5)
WBC: 5.3 10*3/uL (ref 4.0–10.5)

## 2014-02-06 LAB — LIPID PANEL
CHOLESTEROL: 258 mg/dL — AB (ref 0–200)
HDL: 61 mg/dL (ref >39)
LDL Cholesterol: 163 mg/dL — ABNORMAL HIGH (ref 0–99)
Total CHOL/HDL Ratio: 4.2 Ratio
Triglycerides: 169 mg/dL — ABNORMAL HIGH (ref <150)
VLDL: 34 mg/dL (ref 0–40)

## 2014-02-06 MED ORDER — SERTRALINE HCL 100 MG PO TABS: 100.0000 mg | ORAL_TABLET | Freq: Every day | ORAL | Status: DC

## 2014-02-06 MED ORDER — LISINOPRIL 10 MG PO TABS: 10.0000 mg | ORAL_TABLET | Freq: Every day | ORAL | Status: DC

## 2014-02-06 NOTE — Patient Instructions (Signed)
-Take Lisinopril as prescribed for blood pressure.  Please monitor blood pressure at home.  We want it to be below 140/90.  -Increased Zoloft from 50mg  to 100mg .  Please let me know how this is working for you.  Please eat low carb (no bread, pasta, cereal), lots of vegetables and white meat like [place]key or chicken.  Please stay away from red meat due to cholesterol. Please exercise 120 minutes per week.  Please start taking Fish Oil 1,000mg  (2-3 capsules per day) or Red Rice Yeast Extract.  Please follow up in 1 month for BP recheck.  Hypertension Hypertension, commonly called high blood pressure, is when the force of blood pumping through your arteries is too strong. Your arteries are the blood vessels that carry blood from your heart throughout your body. A blood pressure reading consists of a higher number over a lower number, such as 110/72. The higher number (systolic) is the pressure inside your arteries when your heart pumps. The lower number (diastolic) is the pressure inside your arteries when your heart relaxes. Ideally you want your blood pressure below 120/80. Hypertension forces your heart to work harder to pump blood. Your arteries may become narrow or stiff. Having hypertension puts you at risk for heart disease, stroke, and other problems.  RISK FACTORS Some risk factors for high blood pressure are controllable. Others are not.  Risk factors you cannot control include:   Race. You may be at higher risk if you are African American.  Age. Risk increases with age.  Gender. Men are at higher risk than women before age 45 years. After age 45, women are at higher risk than men. Risk factors you can control include:  Not getting enough exercise or physical activity.  Being overweight.  Getting too much fat, sugar, calories, or salt in your diet.  Drinking too much alcohol. SIGNS AND SYMPTOMS Hypertension does not usually cause signs or symptoms. Extremely high blood pressure  (hypertensive crisis) may cause headache, anxiety, shortness of breath, and nosebleed. DIAGNOSIS  To check if you have hypertension, your health care provider will measure your blood pressure while you are seated, with your arm held at the level of your heart. It should be measured at least twice using the same arm. Certain conditions can cause a difference in blood pressure between your right and left arms. A blood pressure reading that is higher than normal on one occasion does not mean that you need treatment. If one blood pressure reading is high, ask your health care provider about having it checked again. TREATMENT  Treating high blood pressure includes making lifestyle changes and possibly taking medicine. Living a healthy lifestyle can help lower high blood pressure. You may need to change some of your habits. Lifestyle changes may include:  Following the DASH diet. This diet is high in fruits, vegetables, and whole grains. It is low in salt, red meat, and added sugars.  Getting at least 2 hours of brisk physical activity every week.  Losing weight if necessary.  Not smoking.  Limiting alcoholic beverages.  Learning ways to reduce stress. If lifestyle changes are not enough to get your blood pressure under control, your health care provider may prescribe medicine. You may need to take more than one. Work closely with your health care provider to understand the risks and benefits. HOME CARE INSTRUCTIONS  Have your blood pressure rechecked as directed by your health care provider.   Take medicines only as directed by your health care provider. Follow  the directions carefully. Blood pressure medicines must be taken as prescribed. The medicine does not work as well when you skip doses. Skipping doses also puts you at risk for problems.   Do not smoke.   Monitor your blood pressure at home as directed by your health care provider. SEEK MEDICAL CARE IF:   You think you are  having a reaction to medicines taken.  You have recurrent headaches or feel dizzy.  You have swelling in your ankles.  You have trouble with your vision. SEEK IMMEDIATE MEDICAL CARE IF:  You develop a severe headache or confusion.  You have unusual weakness, numbness, or feel faint.  You have severe chest or abdominal pain.  You vomit repeatedly.  You have trouble breathing. MAKE SURE YOU:   Understand these instructions.  Will watch your condition.  Will get help right away if you are not doing well or get worse. Document Released: 01/17/2005 Document Revised: 06/03/2013 Document Reviewed: 11/09/2012 [facility] Patient Information 2015 [place], [place]. This information is not intended to replace advice given to you by your health care provider. Make sure you discuss any questions you have with your health care provider.  High Cholesterol High cholesterol refers to having a high level of cholesterol in your blood. Cholesterol is a white, waxy, fat-like protein that your body needs in small amounts. Your liver makes all the cholesterol you need. Excess cholesterol comes from the food you eat. Cholesterol travels in your bloodstream through your blood vessels. If you have high cholesterol, deposits (plaque) may build up on the walls of your blood vessels. This makes the arteries narrower and stiffer. Plaque increases your risk of heart attack and stroke. Work with your health care provider to keep your cholesterol levels in a healthy range. RISK FACTORS Several things can make you more likely to have high cholesterol. These include:   Eating foods high in animal fat (saturated fat) or cholesterol.  Being overweight.  Not getting enough exercise.  Having a family history of high cholesterol. SIGNS AND SYMPTOMS High cholesterol does not cause symptoms. DIAGNOSIS  Your health care provider can do a blood test to check whether you have high cholesterol. If you are older than 20,  your health care provider may check your cholesterol every 4-6 years. You may be checked more often if you already have high cholesterol or other risk factors for heart disease. The blood test for cholesterol measures the following:  Bad cholesterol (LDL cholesterol). This is the type of cholesterol that causes heart disease. This number should be less than 100.  Good cholesterol (HDL cholesterol). This type helps protect against heart disease. A healthy level of HDL cholesterol is 60 or higher.  Total cholesterol. This is the combined number of LDL cholesterol and HDL cholesterol. A healthy number is less than 200. TREATMENT  High cholesterol can be treated with diet changes, lifestyle changes, and medicine.   Diet changes may include eating more whole grains, fruits, vegetables, nuts, and fish. You may also have to cut back on red meat and foods with a lot of added sugar.  Lifestyle changes may include getting at least 40 minutes of aerobic exercise three times a week. Aerobic exercises include walking, biking, and swimming. Aerobic exercise along with a healthy diet can help you maintain a healthy weight. Lifestyle changes may also include quitting smoking.  If diet and lifestyle changes are not enough to lower your cholesterol, your health care provider may prescribe a statin medicine. This medicine  has been shown to lower cholesterol and also lower the risk of heart disease. HOME CARE INSTRUCTIONS  Only take over-the-counter or prescription medicines as directed by your health care provider.   Follow a healthy diet as directed by your health care provider. For instance:   Eat chicken (without skin), fish, veal, shellfish, ground [place] breast, and round or loin cuts of red meat.  Do not eat fried foods and fatty meats, such as hot dogs and salami.   Eat plenty of fruits, such as apples.   Eat plenty of vegetables, such as broccoli, potatoes, and carrots.   Eat beans, peas, and  lentils.   Eat grains, such as barley, rice, couscous, and bulgur wheat.   Eat pasta without cream sauces.   Use skim or nonfat milk and low-fat or nonfat yogurt and cheeses. Do not eat or drink whole milk, cream, ice cream, egg yolks, and hard cheeses.   Do not eat stick margarine or tub margarines that contain trans fats (also called partially hydrogenated oils).   Do not eat cakes, cookies, crackers, or other baked goods that contain trans fats.   Do not eat saturated tropical oils, such as coconut and palm oil.   Exercise as directed by your health care provider. Increase your activity level with activities such as gardening or walking.   Keep all follow-up appointments.  SEEK MEDICAL CARE IF:  You are struggling to maintain a healthy diet or weight.  You need help starting an exercise program.  You need help to stop smoking. SEEK IMMEDIATE MEDICAL CARE IF:  You have chest pain.  You have trouble breathing. Document Released: 01/17/2005 Document Revised: 06/03/2013 Document Reviewed: 11/09/2012 [facility] Patient Information 2015 [place], [place]. This information is not intended to replace advice given to you by your health care provider. Make sure you discuss any questions you have with your health care provider.

## 2014-02-06 NOTE — Progress Notes (Signed)
Assessment and Plan:  1. Essential hypertension -Start taking Lisinopril as prescribed- lisinopril (PRINIVIL,ZESTRIL) 10 MG tablet; Take 1 tablet (10 mg total) by mouth daily.  Dispense: 30 tablet; Refill: 0 Monitor blood pressure at home.  Want below 140/90.  Reminder to go to the ER if any CP, SOB, nausea, dizziness, severe HA, changes vision/speech, left arm numbness and tingling, and jaw pain. - CBC with Differential - COMPLETE METABOLIC PANEL WITH GFR - Lipid panel  2. Depression -Increased Zoloft to  daily- sertraline (ZOLOFT) 100 MG tablet; Take 1 tablet (100 mg total) by mouth daily. TAKE 1 TABLET (50 MG TOTAL) BY MOUTH DAILY.  Dispense: 30 tablet; Refill: 3  3. Hyperlipidemia Please follow recommended diet and exercise.  Check cholesterol. - Lipid panel  4. Encounter for long-term (current) use of medications Will monitor kidney and liver function. - CBC with Differential - COMPLETE METABOLIC PANEL WITH GFR  Continue diet and meds as discussed. Further disposition pending results of labs. Discussed medication effects and SE's.  Pt agreed to treatment plan. Please follow up in 1 month for BP recheck.  HPI A Caucasian 44 y.o. female  presents for 7 month follow up with elevated BP, hyperlipidemia and vitamin D.  Last visit was 07/25/13.  Patient is self-pay and works at daycare.  Patient states she filled out paperwork for [facility] and thinks insurance should be effective January 1st, 2016.  Patient states her niece is a Theatre stage manager and she has been checking her BP and has had readings of 134/124 and 133/110.  Her blood pressure has not been controlled at home, today their BP is BP: (!) 158/106 mmHg She does not workout, but does walk a lot (2,000 steps at work alone).  She states she wants to start eat to live diet again and states she lost 40 pounds before doing that.    She denies chest pain, shortness of breath, dizziness.   She is not on  cholesterol medication and denies myalgias. Her cholesterol is not at goal. The cholesterol last visit was:   Lab Results  Component Value Date   CHOL 249* 07/25/2013   HDL 62 07/25/2013   LDLCALC 165* 07/25/2013   TRIG 110 07/25/2013   CHOLHDL 4.0 07/25/2013   Denies increased appetite, polydipsia and polyuria. Last A1C in the office was:  Lab Results  Component Value Date   HGBA1C 5.3 04/11/2013  Number of meals per day?  3   B- banana or belvia cookies, egg mcmuffin, chick-fil-a biscuit ot [employer]  L- fast food, salads  D- fast food and restaurants   Snacks? Granola bars, pretzels , gold fish  Beverages? Water, soda (regular)   Patient is on Vitamin D supplement.- patient admits to not taking every day Lab Results  Component Value Date   VD25OH 70* 04/11/2013     Current Medications:  Current Outpatient Prescriptions on File Prior to Visit  Medication Sig Dispense Refill  . Cholecalciferol (VITAMIN D) 2000 UNITS tablet Take 10,000 Units by mouth daily. Pt takes Finest Nutrition Bone Health    . Cyanocobalamin (VITAMIN B 12) 100 MCG LOZG Take by mouth.    Marland Kitchen OVER THE COUNTER MEDICATION Take 1 capsule by mouth at bedtime as needed. NatureMade Natural Sleep Aid. For sleep     . prenatal vitamin w/FE, FA (PRENATAL 1 + 1) 27-1 MG TABS Take 1 tablet by mouth daily.      . sertraline (ZOLOFT) 50 MG tablet TAKE 1 TABLET (  50 MG TOTAL) BY MOUTH DAILY. 90 tablet 1   No current facility-administered medications on file prior to visit.   Medical History:  Past Medical History  Diagnosis Date  . Depression     no meds  . Hyperlipidemia   . Hypertension     no meds  . Anxiety    Allergies:  Allergies  Allergen Reactions  . Erythromycin Nausea Only and Other (See Comments)    Stomach cramping  . Flexeril [Cyclobenzaprine Hcl] Other (See Comments)    "don't like the way I feel"  . Metformin And Related     GI Upset    ROS- Review of Systems  Constitutional: Negative.    HENT: Negative for congestion, ear discharge, ear pain and sore throat.   Eyes: Negative.   Respiratory: Negative.   Cardiovascular: Negative.        Except occasional sharp chest pain that comes and goes.  Does not notice any pattern to the pain.  Told patient to pay attention to when it happens.  If chest pain continues, then might send to cardiologist.  Gastrointestinal: Negative.   Genitourinary: Negative.   Musculoskeletal: Negative.        Except for chronic pain in right calf due to tendon damage in the past.  Skin: Negative.   Neurological: Positive for dizziness and headaches.  Psychiatric/Behavioral: Positive for depression. The patient is nervous/anxious.        Patient has depression that started many years ago.  She is on Zoloft 50mg  daily and states it is not helping and would like to increase the dose.  She is in a lot of stress due to finances and with holidays.  Patient had miscarriage in the past and trouble getting pregnant.   Family history- Review and unchanged Social history- Review and unchanged  Physical Exam: BP 158/106 mmHg  Pulse 74  Temp(Src) 98.6 F (37 C) (Temporal)  Resp 18  Ht 5\' 2"  (1.575 m)  Wt 195 lb (88.451 kg)  BMI 35.66 kg/m2  SpO2 98%  LMP 02/06/2014  BP Recheck- Same Wt Readings from Last 3 Encounters:  02/06/14 195 lb (88.451 kg)  09/18/13 189 lb (85.73 kg)  07/25/13 185 lb (83.915 kg)  Vitals Reviewed. General Appearance: Well nourished, in no apparent distress. Obese. Eyes: PERRLA, EOMIs, conjunctiva no swelling or erythema.  No scleral icterus. Sinuses: No Frontal/maxillary tenderness ENT/Mouth: Ext aud canals clear, TMs without erythema, edema or bulging. No erythema, swelling, or exudate on posterior pharynx.  Tonsils not swollen or erythematous. Hearing normal.  Neck: Supple, thyroid normal.  Respiratory: Respiratory effort normal, CTAB.  No w/r/r or stridor.  Cardio: RRR with no M/R/Gs. Brisk peripheral pulses without edema.   Abdomen: Soft, + normal BS.  Non tender, no guarding, rebound, hernias, masses. Lymphatics: Non tender without lymphadenopathy.  Musculoskeletal: Full ROM, 5/5 strength, normal gait.  Skin: Warm, dry intact without rashes, lesions, ecchymosis.  Neuro: Cranial nerves intact. Normal muscle tone, no cerebellar symptoms. Sensation intact.  Psych: Awake and oriented X 3, normal affect, Insight and Judgment appropriate.    Johari Bennetts, Lise AuerJennifer L, PA-C 9:29 AM [facility]

## 2014-03-11 ENCOUNTER — Other Ambulatory Visit: Payer: Self-pay | Admitting: Physician Assistant

## 2014-03-14 ENCOUNTER — Encounter: Payer: Self-pay | Admitting: Physician Assistant

## 2014-03-14 ENCOUNTER — Ambulatory Visit: Payer: Self-pay | Admitting: Physician Assistant

## 2014-03-14 ENCOUNTER — Ambulatory Visit (INDEPENDENT_AMBULATORY_CARE_PROVIDER_SITE_OTHER): Payer: BLUE CROSS/BLUE SHIELD | Admitting: Physician Assistant

## 2014-03-14 VITALS — BP 126/88 | HR 76 | Temp 98.6°F | Resp 18 | Wt 193.0 lb

## 2014-03-14 DIAGNOSIS — R079 Chest pain, unspecified: Secondary | ICD-10-CM

## 2014-03-14 DIAGNOSIS — I10 Essential (primary) hypertension: Secondary | ICD-10-CM

## 2014-03-14 DIAGNOSIS — E782 Mixed hyperlipidemia: Secondary | ICD-10-CM

## 2014-03-14 DIAGNOSIS — F32A Depression, unspecified: Secondary | ICD-10-CM

## 2014-03-14 DIAGNOSIS — K2100 Gastro-esophageal reflux disease with esophagitis, without bleeding: Secondary | ICD-10-CM

## 2014-03-14 DIAGNOSIS — F329 Major depressive disorder, single episode, unspecified: Secondary | ICD-10-CM

## 2014-03-14 NOTE — Progress Notes (Signed)
Assessment and Plan: Impingement Syndrome- get cervical neck pillow, given exercises, suggest PT, follow up in the office if not better.  Atypical CP- nonexertional- likely muscular versus GERD- samples given, will call if not better HTN- continue meds, monitor BP.  Hyperlipidemia - -start on benefiber, strict diet, check lipids at CPE 03/15 and if not better will add medicaiton, decrease fatty foods, increase activity.   Future Appointments Date Time Provider Department Center  04/15/2014 10:00 AM Melissa R Smith, PA-C [id number] None     HPI 44 y.o.female presents for BP and depression follow up. Seen 02/06/2014 for elevated BP and depression. She states every night when she wakes up her hands will be asleep, she does have neck/shoulder pain. Sharp very well localized left sided chest pain intermittent for years, no radiation, will last from seconds to back to back for hours,  Slow breathing helps, nothing makes worse or better. No other accompaniments. She was put on lisinopril 10mg  and her zoloft was increased to 100mg . She states her BP has been 120's/80's, she forgot her BP med this AM.  BP Readings from Last 3 Encounters:  03/14/14 126/88  02/06/14 158/106  09/18/13 128/94     Past Medical History  Diagnosis Date  . Depression     no meds  . Hyperlipidemia   . Hypertension     no meds  . Anxiety      Allergies  Allergen Reactions  . Erythromycin Nausea Only and Other (See Comments)    Stomach cramping  . Flexeril [Cyclobenzaprine Hcl] Other (See Comments)    "don't like the way I feel"  . Metformin And Related     GI Upset      Current Outpatient Prescriptions on File Prior to Visit  Medication Sig Dispense Refill  . Cholecalciferol (VITAMIN D) 2000 UNITS tablet Take 10,000 Units by mouth daily. Pt takes Finest Nutrition Bone Health    . Cyanocobalamin (VITAMIN B 12) 100 MCG LOZG Take by mouth.    . doxylamine, Sleep, (UNISOM) 25 MG tablet Take 25 mg by mouth at  bedtime as needed.    Marland Kitchen. lisinopril (PRINIVIL,ZESTRIL) 10 MG tablet TAKE 1 TABLET (10 MG TOTAL) BY MOUTH DAILY. 30 tablet 0  . naproxen sodium (ANAPROX) 220 MG tablet Take 220 mg by mouth as needed.    Marland Kitchen. OVER THE COUNTER MEDICATION Take 1 capsule by mouth at bedtime as needed. NatureMade Natural Sleep Aid. For sleep     . prenatal vitamin w/FE, FA (PRENATAL 1 + 1) 27-1 MG TABS Take 1 tablet by mouth daily.      . sertraline (ZOLOFT) 100 MG tablet Take 1 tablet (100 mg total) by mouth daily. TAKE 1 TABLET (50 MG TOTAL) BY MOUTH DAILY. 30 tablet 3   No current facility-administered medications on file prior to visit.    ROS: all negative except above.   Physical Exam: Filed Weights   03/14/14 0927  Weight: 193 lb (87.544 kg)   BP 126/88 mmHg  Pulse 76  Temp(Src) 98.6 F (37 C) (Temporal)  Resp 18  Wt 193 lb (87.544 kg)  LMP 03/11/2014 General Appearance: Well nourished, in no apparent distress. Eyes: PERRLA, EOMs, conjunctiva no swelling or erythema Sinuses: No Frontal/maxillary tenderness ENT/Mouth: Ext aud canals clear, TMs without erythema, bulging. No erythema, swelling, or exudate on post pharynx.  Tonsils not swollen or erythematous. Hearing normal.  Neck: Supple, thyroid normal.  Respiratory: Respiratory effort normal, BS equal bilaterally without rales, rhonchi, wheezing or stridor. Mild  chest wall tenderness to palpation.  Cardio: RRR with no MRGs. Brisk peripheral pulses without edema.  Abdomen: Soft, + BS,  + epigastric tenderness, no guarding, rebound, hernias, masses. Lymphatics: Non tender without lymphadenopathy.  Musculoskeletal: Full ROM, 5/5 strength, normal gait.  Skin: Warm, dry without rashes, lesions, ecchymosis.  Neuro: Cranial nerves intact. Normal muscle tone, no cerebellar symptoms. Sensation intact.  Psych: Awake and oriented X 3, normal affect, Insight and Judgment appropriate.     Quentin Mulling, PA-C 9:45 AM [facility]

## 2014-03-14 NOTE — Patient Instructions (Addendum)
Get cervical neck pillow and can do the two exercises i taught you, if not better follow up.   Take omeprazole over the counter for 2 weeks OR samples I gave you, then go to zantac 150-300 mg at night for 2 weeks, then you can stop.  Avoid alcohol, spicy foods, NSAIDS (aleve, ibuprofen) at this time. See foods below.   Food Choices for Gastroesophageal Reflux Disease When you have gastroesophageal reflux disease (GERD), the foods you eat and your eating habits are very important. Choosing the right foods can help ease the discomfort of GERD. WHAT GENERAL GUIDELINES DO I NEED TO FOLLOW?  Choose fruits, vegetables, whole grains, low-fat dairy products, and low-fat meat, fish, and poultry.  Limit fats such as oils, salad dressings, butter, nuts, and avocado.  Keep a food diary to identify foods that cause symptoms.  Avoid foods that cause reflux. These may be different for different people.  Eat frequent small meals instead of three large meals each day.  Eat your meals slowly, in a relaxed setting.  Limit fried foods.  Cook foods using methods other than frying.  Avoid drinking alcohol.  Avoid drinking large amounts of liquids with your meals.  Avoid bending over or lying down until 2-3 hours after eating. WHAT FOODS ARE NOT RECOMMENDED? The following are some foods and drinks that may worsen your symptoms: Vegetables Tomatoes. Tomato juice. Tomato and spaghetti sauce. Chili peppers. Onion and garlic. Horseradish. Fruits Oranges, grapefruit, and lemon (fruit and juice). Meats High-fat meats, fish, and poultry. This includes hot dogs, ribs, ham, sausage, salami, and bacon. Dairy Whole milk and chocolate milk. Sour cream. Cream. Butter. Ice cream. Cream cheese.  Beverages Coffee and tea, with or without caffeine. Carbonated beverages or energy drinks. Condiments Hot sauce. Barbecue sauce.  Sweets/Desserts Chocolate and cocoa. Donuts. Peppermint and spearmint. Fats and  Oils High-fat foods, including [place] fries and potato chips. Other Vinegar. Strong spices, such as black pepper, white pepper, red pepper, cayenne, curry powder, cloves, ginger, and chili powder.  Your LDL is not in range, 02/06/2014: LDL (calc) 163*. Your LDL is the bad cholesterol that can lead to heart attack and stroke. To lower your number you can decrease your fatty foods, red meat, cheese, milk and increase fiber like whole grains and veggies. You can also add a fiber supplement like Metamucil or Benefiber.    Benefiber is good for constipation/diarrhea/irritable bowel syndrome, it helps with weight loss and can help lower your bad cholesterol. Please do 1-2 TBSP in the morning in water, coffee, or tea. It can take up to a month before you can see a difference with your bowel movements. It is cheapest from [employer], [employer], [employer].   Before you even begin to attack a weight-loss plan, it pays to remember this: You are not fat. You have fat. Losing weight isn't about blame or shame; it's simply another achievement to accomplish. Dieting is like any other skill-you have to buckle down and work at it. As long as you act in a smart, reasonable way, you'll ultimately get where you want to be. Here are some weight loss pearls for you.  1. It's Not a Diet. It's a Lifestyle Thinking of a diet as something you're on and suffering through only for the short term doesn't work. To shed weight and keep it off, you need to make permanent changes to the way you eat. It's OK to indulge occasionally, of course, but if you cut calories temporarily and then revert to  your old way of eating, you'll gain back the weight quicker than you can say yo-yo. Use it to lose it. Research shows that one of the best predictors of long-term weight loss is how many pounds you drop in the first month. For that reason, nutritionists often suggest being stricter for the first two weeks of your new eating strategy to build momentum. Cut  out added sugar and alcohol and avoid unrefined carbs. After that, figure out how you can reincorporate them in a way that's healthy and maintainable.  2. There's a Right Way to Exercise Working out burns calories and fat and boosts your metabolism by building muscle. But those trying to lose weight are notorious for overestimating the number of calories they burn and underestimating the amount they take in. Unfortunately, your system is biologically programmed to hold on to extra pounds and that means when you start exercising, your body senses the deficit and ramps up its hunger signals. If you're not diligent, you'll eat everything you burn and then some. Use it to lose it. Cardio gets all the exercise glory, but strength and interval training are the real heroes. They help you build lean muscle, which in turn increases your metabolism and calorie-burning ability 3. Don't Overreact to Mild Hunger Some people have a hard time losing weight because of hunger anxiety. To them, being hungry is bad-something to be avoided at all costs-so they carry snacks with them and eat when they don't need to. Others eat because they're stressed out or bored. While you never want to get to the point of being ravenous (that's when bingeing is likely to happen), a hunger pang, a craving, or the fact that it's 3:00 p.m. should not send you racing for the vending machine or obsessing about the energy bar in your purse. Ideally, you should put off eating until your stomach is growling and it's difficult to concentrate.  Use it to lose it. When you feel the urge to eat, use the HALT method. Ask yourself, Am I really hungry? Or am I angry or anxious, lonely or bored, or tired? If you're still not certain, try the apple test. If you're truly hungry, an apple should seem delicious; if it doesn't, something else is going on. Or you can try drinking water and making yourself busy, if you are still hungry try a healthy snack.  4. Not  All Calories Are Created Equal The mechanics of weight loss are pretty simple: Take in fewer calories than you use for energy. But the kind of food you eat makes all the difference. Processed food that's high in saturated fat and refined starch or sugar can cause inflammation that disrupts the hormone signals that tell your brain you're full. The result: You eat a lot more.  Use it to lose it. Clean up your diet. Swap in whole, unprocessed foods, including vegetables, lean protein, and healthy fats that will fill you up and give you the biggest nutritional bang for your calorie buck. In a few weeks, as your brain starts receiving regular hunger and fullness signals once again, you'll notice that you feel less hungry overall and naturally start cutting back on the amount you eat.  5. Protein, Produce, and Plant-Based Fats Are Your Weight-Loss Trinity Here's why eating the three Ps regularly will help you drop pounds. Protein fills you up. You need it to build lean muscle, which keeps your metabolism humming so that you can torch more fat. People in a weight-loss program who  ate double the recommended daily allowance for protein (about 110 grams for a 150-pound woman) lost 70 percent of their weight from fat, while people who ate the RDA lost only about 40 percent, one study found. Produce is packed with filling fiber. "It's very difficult to consume too many calories if you're eating a lot of vegetables. Example: Three cups of broccoli is a lot of food, yet only 93 calories. (Fruit is another story. It can be easy to overeat and can contain a lot of calories from sugar, so be sure to monitor your intake.) Plant-based fats like olive oil and those in avocados and nuts are healthy and extra satiating.  Use it to lose it. Aim to incorporate each of the three Ps into every meal and snack. People who eat protein throughout the day are able to keep weight off, according to a study in the American Journal of  Clinical Nutrition. In addition to meat, poultry and seafood, good sources are beans, lentils, eggs, tofu, and yogurt. As for fat, keep portion sizes in check by measuring out salad dressing, oil, and nut butters (shoot for one to two tablespoons). Finally, eat veggies or a little fruit at every meal. People who did that consumed 308 fewer calories but didn't feel any hungrier than when they didn't eat more produce.  7. How You Eat Is As Important As What You Eat In order for your brain to register that you're full, you need to focus on what you're eating. Sit down whenever you eat, preferably at a table. Turn off the TV or computer, put down your phone, and look at your food. Smell it. Chew slowly, and don't put another bite on your fork until you swallow. When women ate lunch this attentively, they consumed 30 percent less when snacking later than those who listened to an audiobook at lunchtime, according to a study in the [place] Journal of Nutrition. 8. Weighing Yourself Really Works The scale provides the best evidence about whether your efforts are paying off. Seeing the numbers tick up or down or stagnate is motivation to keep going-or to rethink your approach. A 2015 study at [facility] found that daily weigh-ins helped people lose more weight, keep it off, and maintain that loss, even after two years. Use it to lose it. Step on the scale at the same time every day for the best results. If your weight shoots up several pounds from one weigh-in to the next, don't freak out. Eating a lot of salt the night before or having your period is the likely culprit. The number should return to normal in a day or two. It's a steady climb that you need to do something about. 9. Too Much Stress and Too Little Sleep Are Your Enemies When you're tired and frazzled, your body cranks up the production of cortisol, the stress hormone that can cause carb cravings. Not getting enough sleep also boosts your levels  of ghrelin, a hormone associated with hunger, while suppressing leptin, a hormone that signals fullness and satiety. People on a diet who slept only five and a half hours a night for two weeks lost 55 percent less fat and were hungrier than those who slept eight and a half hours, according to a study in the [place] [employer] Journal. Use it to lose it. Prioritize sleep, aiming for seven hours or more a night, which research shows helps lower stress. And make sure you're getting quality zzz's. If a snoring spouse or a  fidgety cat wakes you up frequently throughout the night, you may end up getting the equivalent of just four hours of sleep, according [facility] [employer]. Keep pets out of the bedroom, and use a white-noise app to drown out snoring. 10. You Will Hit a plateau-And You Can Bust Through It As you slim down, your body releases much less leptin, the fullness hormone.  If you're not strength training, start right now. Building muscle can raise your metabolism to help you overcome a plateau. To keep your body challenged and burning calories, incorporate new moves and more intense intervals into your workouts or add another sweat session to your weekly routine. Alternatively, cut an extra 100 calories or so a day from your diet. Now that you've lost weight, your body simply doesn't need as much fuel.   Ways to cut 100 calories  1. Eat your eggs with hot sauce OR salsa instead of cheese.  Eggs are great for breakfast, but many people consider eggs and cheese to be BFFs. Instead of cheese-1 oz. of cheddar has 114 calories-top your eggs with hot sauce, which contains no calories and helps with satiety and metabolism. Salsa is also a great option!!  2. Top your toast, waffles or pancakes with mashed berries instead of jelly or syrup. Half a cup of berries-fresh, frozen or thawed-has about 40 calories, compared with 2 tbsp. of maple syrup or jelly, which both have about 100  calories. The berries will also give you a good punch of fiber, which helps keep you full and satisfied and won't spike blood sugar quickly like the jelly or syrup. 3. Swap the non-fat latte for black coffee with a splash of half-and-half. Contrary to its name, that non-fat latte has 130 calories and a startling 19g of carbohydrates per 16 oz. serving. Replacing that 'light' drinkable dessert with a black coffee with a splash of half-and-half saves you more than 100 calories per 16 oz. serving. 4. Sprinkle salads with freeze-dried raspberries instead of dried cranberries. If you want a sweet addition to your nutritious salad, stay away from dried cranberries. They have a whopping 130 calories per  cup and 30g carbohydrates. Instead, sprinkle freeze-dried raspberries guilt-free and save more than 100 calories per  cup serving, adding 3g of belly-filling fiber. 5. Go for mustard in place of mayo on your sandwich. Mustard can add really nice flavor to any sandwich, and there are tons of varieties, from spicy to honey. A serving of mayo is 95 calories, versus 10 calories in a serving of mustard. 6. Choose a DIY salad dressing instead of the store-bought kind. Mix Dijon or whole grain mustard with low-fat Kefir or red wine vinegar and garlic. 7. Use hummus as a spread instead of a dip. Use hummus as a spread on a high-fiber cracker or tortilla with a sandwich and save on calories without sacrificing taste. 8. Pick just one salad "accessory." Salad isn't automatically a calorie winner. It's easy to over-accessorize with toppings. Instead of topping your salad with nuts, avocado and cranberries (all three will clock in at 313 calories), just pick one. The next day, choose a different accessory, which will also keep your salad interesting. You don't wear all your jewelry every day, right? 9. Ditch the white pasta in favor of spaghetti squash. One cup of cooked spaghetti squash has about 40 calories, compared  with traditional spaghetti, which comes with more than 200. Spaghetti squash is also nutrient-dense. It's a good source of  fiber and Vitamins A and C, and it can be eaten just like you would eat pasta-with a great tomato sauce and [place] meatballs or with pesto, tofu and spinach, for example. 10. Dress up your chili, soups and stews with non-fat [place] yogurt instead of sour cream. Just a 'dollop' of sour cream can set you back 115 calories and a whopping 12g of fat-seven of which are of the artery-clogging variety. Added bonus: [place] yogurt is packed with muscle-building protein, calcium and B Vitamins. 11. Mash cauliflower instead of mashed potatoes. One cup of traditional mashed potatoes-in all their creamy goodness-has more than 200 calories, compared to mashed cauliflower, which you can typically eat for less than 100 calories per 1 cup serving. Cauliflower is a great source of the antioxidant indole-3-carbinol (I3C), which may help reduce the risk of some cancers, like breast cancer. 12. Ditch the ice cream sundae in favor of a [place] yogurt parfait. Instead of a cup of ice cream or fro-yo for dessert, try 1 cup of nonfat Greek yogurt topped with fresh berries and a sprinkle of cacao nibs. Both toppings are packed with antioxidants, which can help reduce cellular inflammation and oxidative damage. And the comparison is a no-brainer: One cup of ice cream has about 275 calories; one cup of frozen yogurt has about 230; and a cup of Greek yogurt has just 130, plus twice the protein, so you're less likely to return to the freezer for a second helping. 13. Put olive oil in a spray container instead of using it directly from the bottle. Each tablespoon of olive oil is 120 calories and 15g of fat. Use a mister instead of pouring it straight into the pan or onto a salad. This allows for portion control and will save you more than 100 calories. 14. When baking, substitute canned pumpkin for butter or oil. Canned  pumpkin-not pumpkin pie mix-is loaded with Vitamin A, which is important for skin and eye health, as well as immunity. And the comparisons are pretty crazy:  cup of canned pumpkin has about 40 calories, compared to butter or oil, which has more than 800 calories. Yes, 800 calories. Applesauce and mashed banana can also serve as good substitutions for butter or oil, usually in a 1:1 ratio. 15. Top casseroles with high-fiber cereal instead of breadcrumbs. Breadcrumbs are typically made with white bread, while breakfast cereals contain 5-9g of fiber per serving. Not only will you save more than 150 calories per  cup serving, the swap will also keep you more full and you'll get a metabolism boost from the added fiber. 16. Snack on pistachios instead of macadamia nuts. Believe it or not, you get the same amount of calories from 35 pistachios (100 calories) as you would from only five macadamia nuts. 17. Chow down on kale chips rather than potato chips. This is my favorite 'don't knock it 'till you try it' swap. Kale chips are so easy to make at home, and you can spice them up with a little grated parmesan or chili powder. Plus, they're a mere fraction of the calories of potato chips, but with the same crunch factor we crave so often. 18. Add seltzer and some fruit slices to your cocktail instead of soda or fruit juice. One cup of soda or fruit juice can pack on as much as 140 calories. Instead, use seltzer and fruit slices. The fruit provides valuable phytochemicals, such as flavonoids and anthocyanins, which help to combat cancer and stave off the aging process.

## 2014-03-23 ENCOUNTER — Encounter: Payer: Self-pay | Admitting: Physician Assistant

## 2014-03-24 ENCOUNTER — Other Ambulatory Visit: Payer: Self-pay | Admitting: Physician Assistant

## 2014-03-24 MED ORDER — VALSARTAN 80 MG PO TABS: ORAL_TABLET | ORAL | Status: DC

## 2014-04-15 ENCOUNTER — Encounter: Payer: Self-pay | Admitting: Emergency Medicine

## 2014-05-13 ENCOUNTER — Encounter: Payer: Self-pay | Admitting: Emergency Medicine

## 2014-05-13 ENCOUNTER — Ambulatory Visit (INDEPENDENT_AMBULATORY_CARE_PROVIDER_SITE_OTHER): Payer: BLUE CROSS/BLUE SHIELD | Admitting: Emergency Medicine

## 2014-05-13 VITALS — BP 132/84 | HR 80 | Temp 98.0°F | Resp 18 | Ht 62.0 in | Wt 198.0 lb

## 2014-05-13 DIAGNOSIS — R5381 Other malaise: Secondary | ICD-10-CM

## 2014-05-13 DIAGNOSIS — Z139 Encounter for screening, unspecified: Secondary | ICD-10-CM

## 2014-05-13 DIAGNOSIS — R5383 Other fatigue: Secondary | ICD-10-CM

## 2014-05-13 DIAGNOSIS — E782 Mixed hyperlipidemia: Secondary | ICD-10-CM

## 2014-05-13 DIAGNOSIS — Z833 Family history of diabetes mellitus: Secondary | ICD-10-CM

## 2014-05-13 DIAGNOSIS — I10 Essential (primary) hypertension: Secondary | ICD-10-CM

## 2014-05-13 DIAGNOSIS — Z13228 Encounter for screening for other metabolic disorders: Secondary | ICD-10-CM

## 2014-05-13 DIAGNOSIS — Z136 Encounter for screening for cardiovascular disorders: Secondary | ICD-10-CM

## 2014-05-13 DIAGNOSIS — Z0000 Encounter for general adult medical examination without abnormal findings: Secondary | ICD-10-CM

## 2014-05-13 DIAGNOSIS — Z1212 Encounter for screening for malignant neoplasm of rectum: Secondary | ICD-10-CM

## 2014-05-13 DIAGNOSIS — Z79899 Other long term (current) drug therapy: Secondary | ICD-10-CM

## 2014-05-13 DIAGNOSIS — M542 Cervicalgia: Secondary | ICD-10-CM

## 2014-05-13 DIAGNOSIS — E559 Vitamin D deficiency, unspecified: Secondary | ICD-10-CM

## 2014-05-13 DIAGNOSIS — D649 Anemia, unspecified: Secondary | ICD-10-CM

## 2014-05-13 DIAGNOSIS — Z130 Encounter for screening for diseases of the blood and blood-forming organs and certain disorders involving the immune mechanism: Secondary | ICD-10-CM

## 2014-05-13 DIAGNOSIS — Z1329 Encounter for screening for other suspected endocrine disorder: Secondary | ICD-10-CM

## 2014-05-13 DIAGNOSIS — R05 Cough: Secondary | ICD-10-CM

## 2014-05-13 DIAGNOSIS — R059 Cough, unspecified: Secondary | ICD-10-CM

## 2014-05-13 MED ORDER — FLUTICASONE PROPIONATE 50 MCG/ACT NA SUSP: 2.0000 | Freq: Every day | NASAL | Status: DC

## 2014-05-13 NOTE — Progress Notes (Signed)
Subjective:    Patient ID: Samantha Greene, female    DOB: 02-Apr-1970, 44 y.o.   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 deficient . She has not been on cholesterol RX in past she has controlled with diet/ weight loss. BP 130-40/ 80-90. She takes 1/2 pill routinely of Valsartan but increases to whole if BP stays elevated for a couple of days. Exercises with work and walking. She has chronic arthralgias from broken bones in feet and hard to exercise. Denies CV complaint  She notes depression and anxiety controlled with medication for most part. She has increased fatigue with work. She is uses unisom which helps with sleep but wakes frequently. She thinks she may snore.   16 days of bleeding vaginally and abdominal discomfort on/off. She needs to re-establish with GYN. She denies any abnormal GYN history and is not on OCP  Cervical pillow has helped a little with bilateral arm numbness and neck pain. She notes numbness/ pain comes and goes. She uses Naproxen sparingly. 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   She has had recent recurrent lung illness since last AUG with multiple antibiotics. Still with occasionally cough/ congested. She is not taking any allergy medications routinely and denies reflux symptoms.   Lab Results      Component                Value               Date                      WBC                      5.3                 02/06/2014                HGB                      13.9                02/06/2014                HCT                      40.7                02/06/2014                PLT                      307                 02/06/2014                GLUCOSE                  83                  02/06/2014                CHOL                     258*  02/06/2014                TRIG                     169*                02/06/2014                 HDL                      61                  02/06/2014                LDLCALC                  163*                02/06/2014                ALT                      16                  02/06/2014                AST                      19                  02/06/2014                NA                       137                 02/06/2014                K                        4.4                 02/06/2014                CL                       104                 02/06/2014                CREATININE               0.85                02/06/2014                BUN                      13                  02/06/2014                CO2                      25  02/06/2014                TSH                      1.701               07/25/2013                HGBA1C                   5.3                 04/11/2013                MICROALBUR               0.50                04/11/2013                Medication List       This list is accurate as of: 05/13/14  3:27 PM.  Always use your most recent med list.               doxylamine (Sleep) 25 MG tablet  Commonly known as:  UNISOM  Take 25 mg by mouth at bedtime as needed.     naproxen sodium 220 MG tablet  Commonly known as:  ANAPROX  Take 220 mg by mouth as needed.     OVER THE COUNTER MEDICATION  Take 1 capsule by mouth at bedtime as needed. NatureMade Natural Sleep Aid. For sleep     prenatal vitamin w/FE, FA 27-1 MG Tabs tablet  Take 1 tablet by mouth daily.     sertraline 100 MG tablet  Commonly known as:  ZOLOFT  Take 1 tablet (100 mg total) by mouth daily. TAKE 1 TABLET (50 MG TOTAL) BY MOUTH DAILY.     valsartan 80 MG tablet  Commonly known as:  DIOVAN  1/2- 1 pill daily for blood pressure     Vitamin B 12 100 MCG Lozg  Take by mouth.     Vitamin D 2000 UNITS tablet  Take 10,000 Units by mouth daily. Pt takes Finest Nutrition Bone Health       Allergies  Allergen Reactions  .  Erythromycin Nausea Only and Other (See Comments)    Stomach cramping  . Flexeril [Cyclobenzaprine Hcl] Other (See Comments)    "don't like the way I feel"  . Metformin And Related     GI Upset   Past Medical History  Diagnosis Date  . Depression     no meds  . Hyperlipidemia   . Hypertension     no meds  . Anxiety    Past Surgical History  Procedure Laterality Date  . Dilation and curettage of uterus    . Dilation and evacuation     History  Substance Use Topics  . Smoking status: Never Smoker   . Smokeless tobacco: Not on file  . Alcohol Use: No   Family History  Problem Relation Age of Onset  . Breast cancer Mother   . Lung cancer Mother   . Hypertension Mother   . Diabetes Mother   . Kidney disease Mother   . Hyperlipidemia Mother   . Prostate cancer    . Breast cancer Maternal Aunt   . Diabetes Maternal Aunt   . Hyperlipidemia Maternal Aunt   . Heart disease Maternal Aunt   .  Hypertension Maternal Aunt   . Stroke Maternal Aunt   . Hyperlipidemia Brother   . Hypertension Brother   . Diabetes Brother   . Colon cancer Maternal Uncle   . Diabetes Maternal Uncle   . Hyperlipidemia Maternal Uncle   . Heart disease Maternal Uncle   . Hypertension Maternal Uncle   . Stroke Maternal Uncle   . Hemochromatosis Paternal Uncle   . Polycystic ovary syndrome Paternal Uncle   . Heart disease Maternal Grandmother     Needed pacemaker   MAINTENANCE: Colonoscopy:n/a Mammo:05/15/13  WNL BMD:n/a Pap/ Pelvic:2014 AT GYN WNL EYE: glasses reader OVERDUE Dentist:OVERDUE  IMMUNIZATIONS: Tdap:2014 Pneumovax:2014 Zostavax:n/a Influenza:2015   Patient Care Team: Lucky Cowboy, MD as PCP - General (Internal Medicine) Antionette Char, MD as Consulting Physician (Obstetrics and Gynecology) Cindee Salt, MD as Consulting Physician (Orthopedic Surgery) Spong, (Dentist) [place], [facility])  Review of Systems  Constitutional: Positive for fatigue.  HENT: Positive for  postnasal drip.   Respiratory: Positive for cough.   Cardiovascular: Negative for chest pain, palpitations and leg swelling.  Gastrointestinal: Positive for abdominal pain.  Genitourinary: Positive for vaginal bleeding and menstrual problem.  Musculoskeletal: Positive for neck pain.  Psychiatric/Behavioral: Positive for sleep disturbance. The patient is not nervous/anxious.   All other systems reviewed and are negative.     BP 132/84 mmHg  Pulse 80  Temp(Src) 98 F (36.7 C) (Temporal)  Resp 18  Ht 5\' 2"  (1.575 m)  Wt 198 lb (89.812 kg)  BMI 36.21 kg/m2  LMP 05/13/2014  Objective:   Physical Exam  Constitutional: She is oriented to person, place, and time. She appears well-developed and well-nourished. No distress.  HENT:  Head: Normocephalic.  Nose: Nose normal.  Mouth/Throat: Oropharynx is clear and moist.  17 inches neck circumference. Large tongue partially obstructs.  Eyes: Conjunctivae and EOM are normal. Pupils are equal, round, and reactive to light. No scleral icterus.  Neck: Normal range of motion. Neck supple. No JVD present. No tracheal deviation present. No thyromegaly present.  Cardiovascular: Normal rate, regular rhythm, normal heart sounds and intact distal pulses.   Pulmonary/Chest: Effort normal and breath sounds normal.  Abdominal: Soft. Bowel sounds are normal. She exhibits no distension and no mass. There is no tenderness.  Genitourinary:  DEF GYN  Musculoskeletal: Normal range of motion. She exhibits no edema or tenderness.  Improved grip strength and ROM of both hands. Neck ROM WNL  Lymphadenopathy:    She has no cervical adenopathy.  Neurological: She is alert and oriented to person, place, and time. She has normal reflexes. No cranial nerve deficit. She exhibits normal muscle tone. Coordination normal.  Skin: Skin is warm and dry. No rash noted. No erythema.  3rd left medial toe dark flat irregular. Full at [facility]  Psychiatric: She has a normal mood and  affect. Her behavior is normal. Judgment and thought content normal.  Nursing note and vitals reviewed.      EKG NSCSPT     Assessment & Plan:  1. CPE- Update screening labs/ History/ Immunizations/ Testing as needed. Advised healthy diet, QD exercise, increase H20 and continue RX/ Vitamins AD.  2. Fatigue vs sleep apnea vs anemia- check labs, increase  activity and H2O. Sleep study to further evaluate.   3. 3 month F/U for HTN, Cholesterol, Pre-Dm, D. Deficient. Needs healthy diet, cardio QD and obtain healthy weight. Check Labs, Check BP if >130/80 call office   4. Cough with recurrent lung infections- check labs/ cxr. If both NEG may  need referral to ENT vs pulmonology/Allergy  5. Neck pain- Xray Cervical to evaluate, may need PT if no improvement vs referral ortho/ neuro  6.  Irreg Nevi- monitor for any change, call if occurs for removal , Advised needs full check at [facility] and probable removal of dark area on toe.  7. Irregular menses- GET f/u GYN ASAP

## 2014-05-13 NOTE — Patient Instructions (Signed)
Sleep Apnea  Sleep apnea is disorder that affects a person's sleep. A person with sleep apnea has abnormal pauses in their breathing when they sleep. It is hard for them to get a good sleep. This makes a person tired during the day. It also can lead to other physical problems. There are three types of sleep apnea. One type is when breathing stops for a short time because your airway is blocked (obstructive sleep apnea). Another type is when the brain sometimes fails to give the normal signal to breathe to the muscles that control your breathing (central sleep apnea). The third type is a combination of the other two types.  HOME CARE  · Do not sleep on your back. Try to sleep on your side.  · Take all medicine as told by your doctor.  · Avoid alcohol, calming medicines (sedatives), and depressant drugs.  · Try to lose weight if you are overweight. Talk to your doctor about a healthy weight goal.  Your doctor may have you use a device that helps to open your airway. It can help you get the air that you need. It is called a positive airway pressure (PAP) device. There are three types of PAP devices:  · Continuous positive airway pressure (CPAP) device.  · Nasal expiratory positive airway pressure (EPAP) device.  · Bilevel positive airway pressure (BPAP) device.  MAKE SURE YOU:  · Understand these instructions.  · Will watch your condition.  · Will get help right away if you are not doing well or get worse.  Document Released: 10/27/2007 Document Revised: 01/04/2012 Document Reviewed: 05/21/2011  ExitCare® Patient Information ©2015 [employer] LLC. This information is not intended to replace advice given to you by your health care provider. Make sure you discuss any questions you have with your health care provider.

## 2014-05-14 ENCOUNTER — Other Ambulatory Visit: Payer: Self-pay | Admitting: Emergency Medicine

## 2014-05-14 ENCOUNTER — Other Ambulatory Visit: Payer: Self-pay

## 2014-05-14 DIAGNOSIS — G4719 Other hypersomnia: Secondary | ICD-10-CM

## 2014-05-14 DIAGNOSIS — R5383 Other fatigue: Secondary | ICD-10-CM

## 2014-05-14 LAB — HEPATIC FUNCTION PANEL
ALT: 15 U/L (ref 0–35)
AST: 18 U/L (ref 0–37)
Albumin: 4.2 g/dL (ref 3.5–5.2)
Alkaline Phosphatase: 57 U/L (ref 39–117)
BILIRUBIN DIRECT: 0.1 mg/dL (ref 0.0–0.3)
BILIRUBIN INDIRECT: 0.3 mg/dL (ref 0.2–1.2)
TOTAL PROTEIN: 6.9 g/dL (ref 6.0–8.3)
Total Bilirubin: 0.4 mg/dL (ref 0.2–1.2)

## 2014-05-14 LAB — CBC WITH DIFFERENTIAL/PLATELET
BASOS ABS: 0 10*3/uL (ref 0.0–0.1)
Basophils Relative: 0 % (ref 0–1)
Eosinophils Absolute: 0.1 10*3/uL (ref 0.0–0.7)
Eosinophils Relative: 1 % (ref 0–5)
HCT: 38.3 % (ref 36.0–46.0)
Hemoglobin: 12.9 g/dL (ref 12.0–15.0)
LYMPHS ABS: 2.2 10*3/uL (ref 0.7–4.0)
Lymphocytes Relative: 32 % (ref 12–46)
MCH: 31.1 pg (ref 26.0–34.0)
MCHC: 33.7 g/dL (ref 30.0–36.0)
MCV: 92.3 fL (ref 78.0–100.0)
MPV: 8.6 fL (ref 8.6–12.4)
Monocytes Absolute: 0.6 10*3/uL (ref 0.1–1.0)
Monocytes Relative: 9 % (ref 3–12)
NEUTROS ABS: 4.1 10*3/uL (ref 1.7–7.7)
Neutrophils Relative %: 58 % (ref 43–77)
PLATELETS: 339 10*3/uL (ref 150–400)
RBC: 4.15 MIL/uL (ref 3.87–5.11)
RDW: 14.3 % (ref 11.5–15.5)
WBC: 7 10*3/uL (ref 4.0–10.5)

## 2014-05-14 LAB — LIPID PANEL
Cholesterol: 249 mg/dL — ABNORMAL HIGH (ref 0–200)
HDL: 75 mg/dL (ref >=46)
LDL CALC: 155 mg/dL — AB (ref 0–99)
TRIGLYCERIDES: 97 mg/dL (ref <150)
Total CHOL/HDL Ratio: 3.3 Ratio
VLDL: 19 mg/dL (ref 0–40)

## 2014-05-14 LAB — TSH: TSH: 2.642 u[IU]/mL (ref 0.350–4.500)

## 2014-05-14 LAB — URINALYSIS, ROUTINE W REFLEX MICROSCOPIC
Bilirubin Urine: NEGATIVE
Glucose, UA: NEGATIVE mg/dL
Ketones, ur: NEGATIVE mg/dL
LEUKOCYTES UA: NEGATIVE
Nitrite: NEGATIVE
Protein, ur: NEGATIVE mg/dL
Specific Gravity, Urine: <1.005 — ABNORMAL LOW (ref 1.005–1.030)
UROBILINOGEN UA: 0.2 mg/dL (ref 0.0–1.0)
pH: 6.5 (ref 5.0–8.0)

## 2014-05-14 LAB — BASIC METABOLIC PANEL WITH GFR
BUN: 13 mg/dL (ref 6–23)
CHLORIDE: 102 meq/L (ref 96–112)
CO2: 23 mEq/L (ref 19–32)
Calcium: 9.3 mg/dL (ref 8.4–10.5)
Creat: 0.81 mg/dL (ref 0.50–1.10)
GFR, EST NON AFRICAN AMERICAN: 89 mL/min
GLUCOSE: 81 mg/dL (ref 70–99)
POTASSIUM: 4 meq/L (ref 3.5–5.3)
Sodium: 137 mEq/L (ref 135–145)

## 2014-05-14 LAB — URINALYSIS, MICROSCOPIC ONLY
Bacteria, UA: NONE SEEN
CRYSTALS: NONE SEEN
Casts: NONE SEEN
Squamous Epithelial / LPF: NONE SEEN

## 2014-05-14 LAB — MICROALBUMIN / CREATININE URINE RATIO
Creatinine, Urine: 15.2 mg/dL
MICROALB UR: 0.2 mg/dL (ref <2.0)
Microalb Creat Ratio: 13.2 mg/g (ref 0.0–30.0)

## 2014-05-14 LAB — IRON AND TIBC
%SAT: 20 % (ref 20–55)
Iron: 81 ug/dL (ref 42–145)
TIBC: 414 ug/dL (ref 250–470)
UIBC: 333 ug/dL (ref 125–400)

## 2014-05-14 LAB — INSULIN, FASTING: Insulin fasting, serum: 17 u[IU]/mL (ref 2.0–19.6)

## 2014-05-14 LAB — HEMOGLOBIN A1C
HEMOGLOBIN A1C: 5.6 % (ref <5.7)
Mean Plasma Glucose: 114 mg/dL (ref <117)

## 2014-05-14 LAB — VITAMIN D 25 HYDROXY (VIT D DEFICIENCY, FRACTURES): Vit D, 25-Hydroxy: 29 ng/mL — ABNORMAL LOW (ref 30–100)

## 2014-05-14 LAB — MAGNESIUM: Magnesium: 2 mg/dL (ref 1.5–2.5)

## 2014-05-14 MED ORDER — PRAVASTATIN SODIUM 40 MG PO TABS: 40.0000 mg | ORAL_TABLET | Freq: Every day | ORAL | Status: DC

## 2014-05-21 ENCOUNTER — Ambulatory Visit
Admission: RE | Admit: 2014-05-21 | Discharge: 2014-05-21 | Disposition: A | Discharge status: Home / Self Care | Payer: No Typology Code available for payment source | Source: Ambulatory Visit | Attending: Emergency Medicine | Admitting: Emergency Medicine

## 2014-05-21 DIAGNOSIS — M542 Cervicalgia: Secondary | ICD-10-CM

## 2014-05-21 DIAGNOSIS — R059 Cough, unspecified: Secondary | ICD-10-CM

## 2014-05-21 DIAGNOSIS — R05 Cough: Secondary | ICD-10-CM

## 2014-05-22 ENCOUNTER — Other Ambulatory Visit: Payer: Self-pay | Admitting: Internal Medicine

## 2014-05-22 ENCOUNTER — Telehealth: Payer: Self-pay | Admitting: *Deleted

## 2014-05-22 MED ORDER — AZITHROMYCIN 250 MG PO TABS: ORAL_TABLET | ORAL | Status: AC

## 2014-05-22 NOTE — Telephone Encounter (Signed)
Patient called with c/o increasing sinus pressure/pain, sore throat, cough congestion and "bad taste from mucus."  Patient states symptoms have been recurrent times months but have recently increased times 2 days.  No relief from OTC medications.  Per Dr. Kathryne SharperMcKeown's orders, patient was advised we will send in a Zpak and to try OTC Sudafed PE.  Advised her we will need to see her next week for an ov if no better.

## 2014-06-02 ENCOUNTER — Telehealth: Payer: Self-pay | Admitting: *Deleted

## 2014-06-02 NOTE — Telephone Encounter (Signed)
Patient called and states she has been exposed to hand-foot-and-mouth disease.  Per Dr Oneta RackMcKeown, be sure to do good handwashing techniques and be sure to take her Vitamin D.

## 2014-06-11 ENCOUNTER — Encounter: Payer: Self-pay | Admitting: Emergency Medicine

## 2014-06-11 ENCOUNTER — Ambulatory Visit (INDEPENDENT_AMBULATORY_CARE_PROVIDER_SITE_OTHER): Payer: BLUE CROSS/BLUE SHIELD | Admitting: Emergency Medicine

## 2014-06-11 VITALS — BP 126/80 | HR 88 | Temp 98.0°F | Resp 18 | Ht 61.0 in | Wt 200.0 lb

## 2014-06-11 DIAGNOSIS — R05 Cough: Secondary | ICD-10-CM

## 2014-06-11 DIAGNOSIS — R059 Cough, unspecified: Secondary | ICD-10-CM

## 2014-06-11 DIAGNOSIS — E782 Mixed hyperlipidemia: Secondary | ICD-10-CM

## 2014-06-11 NOTE — Progress Notes (Signed)
Subjective:    Patient ID: Samantha Greene, female    DOB: 03-Sep-1970, 44 y.o.   MRN: [id number]  HPI Comments: 44 yo Cholesterol close recheck. She restarted cholesterol prescription at last OV. She has been trying to improve diet but has not started exercising or lost weight.  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 She is still with cough. She will cut back hours at work to see if immune system improves. She has normal CXR. She only has reflux if eats late or spicy.    Lab Results      Component                Value               Date                      WBC                      7.0                 05/13/2014                HGB                      12.9                05/13/2014                HCT                      38.3                05/13/2014                PLT                      339                 05/13/2014                GLUCOSE                  81                  05/13/2014                CHOL                     249*                05/13/2014                TRIG                     97                  05/13/2014                HDL                      75                  05/13/2014                LDLCALC  155*                05/13/2014                ALT                      15                  05/13/2014                AST                      18                  05/13/2014                NA                       137                 05/13/2014                K                        4.0                 05/13/2014                CL                       102                 05/13/2014                CREATININE               0.81                05/13/2014                BUN                      13                  05/13/2014                CO2                      23                  05/13/2014                TSH                      2.642               05/13/2014                HGBA1C                   5.6                  05/13/2014                MICROALBUR               0.2  05/13/2014               Medication List       This list is accurate as of: 06/11/14  4:52 PM.  Always use your most recent med list.               doxylamine (Sleep) 25 MG tablet  Commonly known as:  UNISOM  Take 25 mg by mouth at bedtime as needed.     fluticasone 50 MCG/ACT nasal spray  Commonly known as:  FLONASE  Place 2 sprays into both nostrils daily.     naproxen sodium 220 MG tablet  Commonly known as:  ANAPROX  Take 220 mg by mouth as needed.     OVER THE COUNTER MEDICATION  Take 1 capsule by mouth at bedtime as needed. NatureMade Natural Sleep Aid. For sleep     pravastatin 40 MG tablet  Commonly known as:  PRAVACHOL  Take 1 tablet (40 mg total) by mouth daily.     prenatal vitamin w/FE, FA 27-1 MG Tabs tablet  Take 1 tablet by mouth daily.     sertraline 100 MG tablet  Commonly known as:  ZOLOFT  Take 1 tablet (100 mg total) by mouth daily. TAKE 1 TABLET (50 MG TOTAL) BY MOUTH DAILY.     valsartan 80 MG tablet  Commonly known as:  DIOVAN  1/2- 1 pill daily for blood pressure     Vitamin B 12 100 MCG Lozg  Take by mouth.     Vitamin D 2000 UNITS tablet  Take 10,000 Units by mouth daily. Pt takes Finest Nutrition Bone Health       Allergies  Allergen Reactions  . Erythromycin Nausea Only and Other (See Comments)    Stomach cramping  . Flexeril [Cyclobenzaprine Hcl] Other (See Comments)    "don't like the way I feel"  . Metformin And Related     GI Upset      Review of Systems  Constitutional: Positive for fatigue.  Respiratory: Positive for cough.   Cardiovascular: Negative for chest pain.  Musculoskeletal: Negative for myalgias.  All other systems reviewed and are negative.  BP 126/80 mmHg  Pulse 88  Temp(Src) 98 F (36.7 C) (Temporal)  Resp 18  Ht  (1.549 m)  Wt 200 lb (90.719 kg)  BMI 37.81 kg/m2  LMP 06/02/2014     Objective:   Physical  Exam  Constitutional: She appears well-developed and well-nourished. No distress.  HENT:  Head: Normocephalic.  Eyes: Conjunctivae and EOM are normal.  Neck: Normal range of motion. Neck supple.  Cardiovascular: Normal rate, regular rhythm, normal heart sounds and intact distal pulses.   Pulmonary/Chest: Effort normal and breath sounds normal.  Abdominal: Soft. Bowel sounds are normal. There is no tenderness.  Musculoskeletal: Normal range of motion.  Neurological: She is alert. No cranial nerve deficit.  Skin: Skin is warm and dry. No rash noted.  Psychiatric: She has a normal mood and affect. Judgment normal.  Nursing note and vitals reviewed.       Assessment & Plan:  1. Cholesterol- recheck labs, Need to eat healthier and exercise AD.   2. Cough- TRY Zantac BID X 2 weeks to see if any change vs limited exposure to sick children. Call with any concerns or if doesn't improve for further evaluation

## 2014-06-11 NOTE — Patient Instructions (Signed)
ZANTAC twice daily x 2 weeks for cough  Food Choices for Gastroesophageal Reflux Disease When you have gastroesophageal reflux disease (GERD), the foods you eat and your eating habits are very important. Choosing the right foods can help ease the discomfort of GERD. WHAT GENERAL GUIDELINES DO I NEED TO FOLLOW?  Choose fruits, vegetables, whole grains, low-fat dairy products, and low-fat meat, fish, and poultry.  Limit fats such as oils, salad dressings, butter, nuts, and avocado.  Keep a food diary to identify foods that cause symptoms.  Avoid foods that cause reflux. These may be different for different people.  Eat frequent small meals instead of three large meals each day.  Eat your meals slowly, in a relaxed setting.  Limit fried foods.  Cook foods using methods other than frying.  Avoid drinking alcohol.  Avoid drinking large amounts of liquids with your meals.  Avoid bending over or lying down until 2-3 hours after eating. WHAT FOODS ARE NOT RECOMMENDED? The following are some foods and drinks that may worsen your symptoms: Vegetables Tomatoes. Tomato juice. Tomato and spaghetti sauce. Chili peppers. Onion and garlic. Horseradish. Fruits Oranges, grapefruit, and lemon (fruit and juice). Meats High-fat meats, fish, and poultry. This includes hot dogs, ribs, ham, sausage, salami, and bacon. Dairy Whole milk and chocolate milk. Sour cream. Cream. Butter. Ice cream. Cream cheese.  Beverages Coffee and tea, with or without caffeine. Carbonated beverages or energy drinks. Condiments Hot sauce. Barbecue sauce.  Sweets/Desserts Chocolate and cocoa. Donuts. Peppermint and spearmint. Fats and Oils High-fat foods, including [place] fries and potato chips. Other Vinegar. Strong spices, such as black pepper, white pepper, red pepper, cayenne, curry powder, cloves, ginger, and chili powder. The items listed above may not be a complete list of foods and beverages to avoid.  Contact your dietitian for more information. Document Released: 01/17/2005 Document Revised: 01/22/2013 Document Reviewed: 11/21/2012 [facility] Patient Information 2015 [place], [place]. This information is not intended to replace advice given to you by your health care provider. Make sure you discuss any questions you have with your health care provider.

## 2014-06-12 LAB — LIPID PANEL
CHOLESTEROL: 197 mg/dL (ref 0–200)
HDL: 77 mg/dL (ref >=46)
LDL Cholesterol: 99 mg/dL (ref 0–99)
TRIGLYCERIDES: 105 mg/dL (ref <150)
Total CHOL/HDL Ratio: 2.6 Ratio
VLDL: 21 mg/dL (ref 0–40)

## 2014-06-12 LAB — HEPATIC FUNCTION PANEL
ALK PHOS: 55 U/L (ref 39–117)
ALT: 16 U/L (ref 0–35)
AST: 17 U/L (ref 0–37)
Albumin: 4.2 g/dL (ref 3.5–5.2)
BILIRUBIN INDIRECT: 0.3 mg/dL (ref 0.2–1.2)
BILIRUBIN TOTAL: 0.4 mg/dL (ref 0.2–1.2)
Bilirubin, Direct: 0.1 mg/dL (ref 0.0–0.3)
Total Protein: 6.7 g/dL (ref 6.0–8.3)

## 2014-06-23 ENCOUNTER — Other Ambulatory Visit: Payer: Self-pay | Admitting: Physician Assistant

## 2014-07-06 ENCOUNTER — Other Ambulatory Visit: Payer: Self-pay | Admitting: Physician Assistant

## 2014-07-07 ENCOUNTER — Encounter: Payer: Self-pay | Admitting: *Deleted

## 2014-07-08 ENCOUNTER — Other Ambulatory Visit: Payer: Self-pay | Admitting: Physician Assistant

## 2014-07-14 ENCOUNTER — Other Ambulatory Visit: Payer: Self-pay | Admitting: Physician Assistant

## 2014-07-17 ENCOUNTER — Encounter: Payer: Self-pay | Admitting: *Deleted

## 2014-08-06 ENCOUNTER — Encounter: Payer: Self-pay | Admitting: Internal Medicine

## 2014-08-06 ENCOUNTER — Ambulatory Visit (INDEPENDENT_AMBULATORY_CARE_PROVIDER_SITE_OTHER): Payer: BLUE CROSS/BLUE SHIELD | Admitting: Internal Medicine

## 2014-08-06 VITALS — BP 122/66 | HR 80 | Temp 98.4°F | Resp 18 | Ht 61.0 in

## 2014-08-06 DIAGNOSIS — J069 Acute upper respiratory infection, unspecified: Secondary | ICD-10-CM

## 2014-08-06 MED ORDER — BENZONATATE 200 MG PO CAPS: 200.0000 mg | ORAL_CAPSULE | Freq: Three times a day (TID) | ORAL | Status: DC | PRN

## 2014-08-06 MED ORDER — DOXYCYCLINE HYCLATE 100 MG PO CAPS
100.0000 mg | ORAL_CAPSULE | Freq: Two times a day (BID) | ORAL | Status: DC
Start: 2014-08-06 — End: 2014-09-24

## 2014-08-06 MED ORDER — PHENYLEPH-PROMETHAZINE-COD 5-6.25-10 MG/5ML PO SYRP: 5.0000 mL | ORAL_SOLUTION | Freq: Every evening | ORAL | Status: DC | PRN

## 2014-08-06 MED ORDER — ALBUTEROL SULFATE HFA 108 (90 BASE) MCG/ACT IN AERS: 2.0000 | INHALATION_SPRAY | RESPIRATORY_TRACT | Status: DC | PRN

## 2014-08-06 MED ORDER — PREDNISONE 20 MG PO TABS: ORAL_TABLET | ORAL | Status: DC

## 2014-08-06 MED ORDER — MONTELUKAST SODIUM 10 MG PO TABS: 10.0000 mg | ORAL_TABLET | Freq: Every day | ORAL | Status: DC

## 2014-08-06 NOTE — Progress Notes (Signed)
   Subjective:    Patient ID: Samantha Greene, female    DOB: 08-26-1970, 44 y.o.   MRN: [id number]  Cough Associated symptoms include chills, headaches, a sore throat, shortness of breath and wheezing. Pertinent negatives include no chest pain, ear pain, fever or postnasal drip.  Sore Throat  Associated symptoms include congestion, coughing, headaches and shortness of breath. Pertinent negatives include no ear pain or vomiting.   Patient presents to the office for evaluation of cough and sore throat.  She reports that this has been going on since Sunday of last week.  She reports that she has had some shortness of breath and some wheezing with her cough.  She reports some congestion and runny nose, and also some sore throat and mild ear pain.  She does report headache with lots of coughing.  Cough is dry and barking.  She does report that her cough is the same all the time.  She does have seasonal allergies and she does take claritin every night.  She reports that she has been constantly sick since August of last year. She is a non-smoker.   Review of Systems  Constitutional: Positive for chills and fatigue. Negative for fever.  HENT: Positive for congestion and sore throat. Negative for ear pain, nosebleeds and postnasal drip.   Respiratory: Positive for cough, shortness of breath and wheezing. Negative for chest tightness.   Cardiovascular: Negative for chest pain and palpitations.  Gastrointestinal: Negative for nausea and vomiting.  Neurological: Positive for headaches.       Objective:   Physical Exam  Constitutional: She is oriented to person, place, and time. She appears well-developed and well-nourished. No distress.  HENT:  Head: Normocephalic.  Nose: Mucosal edema present. Right sinus exhibits no maxillary sinus tenderness and no frontal sinus tenderness. Left sinus exhibits no maxillary sinus tenderness and no frontal sinus tenderness.  Mouth/Throat: Uvula is midline and  mucous membranes are normal. No trismus in the jaw. Posterior oropharyngeal erythema present. No oropharyngeal exudate, posterior oropharyngeal edema or tonsillar abscesses.  Eyes: Conjunctivae are normal. No scleral icterus.  Neck: Normal range of motion. Neck supple.  Cardiovascular: Normal rate, regular rhythm, normal heart sounds and intact distal pulses.  Exam reveals no gallop and no friction rub.   No murmur heard. Pulmonary/Chest: Effort normal. No respiratory distress. She has wheezes. She has no rales. She exhibits no tenderness.  Neurological: She is alert and oriented to person, place, and time.  Skin: Skin is warm and dry. She is not diaphoretic.  Psychiatric: She has a normal mood and affect. Her behavior is normal. Judgment and thought content normal.  Nursing note and vitals reviewed.         Assessment & Plan:    1. Acute upper respiratory infection -doxycycline -tessalon -phenergan codeine for nighttime -albuterol -breo sample -montelukast -saline claritin

## 2014-08-06 NOTE — Patient Instructions (Signed)
Upper Respiratory Infection, Adult An upper respiratory infection (URI) is also sometimes known as the common cold. The upper respiratory tract includes the nose, sinuses, throat, trachea, and bronchi. Bronchi are the airways leading to the lungs. Most people improve within 1 week, but symptoms can last up to 2 weeks. A residual cough may last even longer.  CAUSES Many different viruses can infect the tissues lining the upper respiratory tract. The tissues become irritated and inflamed and often become very moist. Mucus production is also common. A cold is contagious. You can easily spread the virus to others by oral contact. This includes kissing, sharing a glass, coughing, or sneezing. Touching your mouth or nose and then touching a surface, which is then touched by another person, can also spread the virus. SYMPTOMS  Symptoms typically develop 1 to 3 days after you come in contact with a cold virus. Symptoms vary from person to person. They may include:  Runny nose.  Sneezing.  Nasal congestion.  Sinus irritation.  Sore throat.  Loss of voice (laryngitis).  Cough.  Fatigue.  Muscle aches.  Loss of appetite.  Headache.  Low-grade fever. DIAGNOSIS  You might diagnose your own cold based on familiar symptoms, since most people get a cold 2 to 3 times a year. Your caregiver can confirm this based on your exam. Most importantly, your caregiver can check that your symptoms are not due to another disease such as strep throat, sinusitis, pneumonia, asthma, or epiglottitis. Blood tests, throat tests, and X-rays are not necessary to diagnose a common cold, but they may sometimes be helpful in excluding other more serious diseases. Your caregiver will decide if any further tests are required. RISKS AND COMPLICATIONS  You may be at risk for a more severe case of the common cold if you smoke cigarettes, have chronic heart disease (such as heart failure) or lung disease (such as asthma), or if  you have a weakened immune system. The very young and very old are also at risk for more serious infections. Bacterial sinusitis, middle ear infections, and bacterial pneumonia can complicate the common cold. The common cold can worsen asthma and chronic obstructive pulmonary disease (COPD). Sometimes, these complications can require emergency medical care and may be life-threatening. PREVENTION  The best way to protect against getting a cold is to practice good hygiene. Avoid oral or hand contact with people with cold symptoms. Wash your hands often if contact occurs. There is no clear evidence that vitamin C, vitamin E, echinacea, or exercise reduces the chance of developing a cold. However, it is always recommended to get plenty of rest and practice good nutrition. TREATMENT  Treatment is directed at relieving symptoms. There is no cure. Antibiotics are not effective, because the infection is caused by a virus, not by bacteria. Treatment may include:  Increased fluid intake. Sports drinks offer valuable electrolytes, sugars, and fluids.  Breathing heated mist or steam (vaporizer or shower).  Eating chicken soup or other clear broths, and maintaining good nutrition.  Getting plenty of rest.  Using gargles or lozenges for comfort.  Controlling fevers with ibuprofen or acetaminophen as directed by your caregiver.  Increasing usage of your inhaler if you have asthma. Zinc gel and zinc lozenges, taken in the first 24 hours of the common cold, can shorten the duration and lessen the severity of symptoms. Pain medicines may help with fever, muscle aches, and throat pain. A variety of non-prescription medicines are available to treat congestion and runny nose. Your caregiver   can make recommendations and may suggest nasal or lung inhalers for other symptoms.  HOME CARE INSTRUCTIONS   Only take over-the-counter or prescription medicines for pain, discomfort, or fever as directed by your  caregiver.  Use a warm mist humidifier or inhale steam from a shower to increase air moisture. This may keep secretions moist and make it easier to breathe.  Drink enough water and fluids to keep your urine clear or pale yellow.  Rest as needed.  Return to work when your temperature has returned to normal or as your caregiver advises. You may need to stay home longer to avoid infecting others. You can also use a face mask and careful hand washing to prevent spread of the virus. SEEK MEDICAL CARE IF:   After the first few days, you feel you are getting worse rather than better.  You need your caregiver's advice about medicines to control symptoms.  You develop chills, worsening shortness of breath, or brown or red sputum. These may be signs of pneumonia.  You develop yellow or brown nasal discharge or pain in the face, especially when you bend forward. These may be signs of sinusitis.  You develop a fever, swollen neck glands, pain with swallowing, or white areas in the back of your throat. These may be signs of strep throat. SEEK IMMEDIATE MEDICAL CARE IF:   You have a fever.  You develop severe or persistent headache, ear pain, sinus pain, or chest pain.  You develop wheezing, a prolonged cough, cough up blood, or have a change in your usual mucus (if you have chronic lung disease).  You develop sore muscles or a stiff neck. Document Released: 07/13/2000 Document Revised: 04/11/2011 Document Reviewed: 04/24/2013 [employer] Patient Information 2015 [employer]. This information is not intended to replace advice given to you by your health care provider. Make sure you discuss any questions you have with your health care provider.  

## 2014-08-26 ENCOUNTER — Ambulatory Visit: Payer: Self-pay | Admitting: Internal Medicine

## 2014-09-24 ENCOUNTER — Ambulatory Visit (INDEPENDENT_AMBULATORY_CARE_PROVIDER_SITE_OTHER): Payer: BLUE CROSS/BLUE SHIELD | Admitting: Internal Medicine

## 2014-09-24 ENCOUNTER — Encounter: Payer: Self-pay | Admitting: Internal Medicine

## 2014-09-24 VITALS — BP 128/86 | HR 70 | Temp 98.0°F | Resp 18 | Ht 61.0 in | Wt 208.0 lb

## 2014-09-24 DIAGNOSIS — R05 Cough: Secondary | ICD-10-CM | POA: Diagnosis not present

## 2014-09-24 DIAGNOSIS — R053 Chronic cough: Secondary | ICD-10-CM

## 2014-09-25 NOTE — Progress Notes (Signed)
   Subjective:    Patient ID: Samantha Greene, female    DOB: 04/28/1970, 44 y.o.   MRN: [id number]  Cough Associated symptoms include postnasal drip and wheezing. Pertinent negatives include no chest pain, chills, fever, rhinorrhea, sore throat or shortness of breath.   Patient returns to the office as she has been having chronic cough and upper respiratory infection for roughly the past year.  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  She has had numerous courses of antibiotics of various types and prednisone with little relief.  She feels that the most effective treatment that she has had thus far has been the breo inhaler but she did not call the office to let us know that she was out of the inhaler.  She has never seen pulmonology.  She has continued to take claritin, singulair, and flonase with little relief.  She has never had severe allergies in the past.  She does admit to having cats for the past 2 years but she has never had them in the past.  She is a never smoker.       Review of Systems  Constitutional: Negative for fever, chills and fatigue.  HENT: Positive for congestion and postnasal drip. Negative for rhinorrhea, sinus pressure, sore throat and voice change.   Eyes: Negative.   Respiratory: Positive for cough and wheezing. Negative for chest tightness and shortness of breath.   Cardiovascular: Negative for chest pain and palpitations.  Gastrointestinal: Negative for nausea and vomiting.       Objective:   Physical Exam  Constitutional: She is oriented to person, place, and time. She appears well-developed and well-nourished. No distress.  HENT:  Head: Normocephalic.  Mouth/Throat: Oropharynx is clear and moist. No oropharyngeal exudate.  Eyes: Conjunctivae are normal. No scleral icterus.  Neck: Normal range of motion. Neck supple. No JVD present. No thyromegaly present.  Cardiovascular:  Normal rate, regular rhythm, normal heart sounds and intact distal pulses.  Exam reveals no gallop and no friction rub.   No murmur heard. Pulmonary/Chest: Effort normal. No respiratory distress. She has no decreased breath sounds. She has no wheezes. She has rhonchi (occasional which clears with coughing). She has no rales. She exhibits no tenderness.  Musculoskeletal: Normal range of motion.  Lymphadenopathy:    She has no cervical adenopathy.  Neurological: She is alert and oriented to person, place, and time.  Skin: Skin is warm and dry. She is not diaphoretic.  Psychiatric: She has a normal mood and affect. Her behavior is normal. Judgment and thought content normal.  Nursing note and vitals reviewed.         Assessment & Plan:    1. Chronic cough -codeine phenergan syrup -prednisone taper -breo 200/25 inhaler -questionable severe allergies vs. RAD? - Ambulatory referral to Pulmonology/allergist

## 2014-10-01 ENCOUNTER — Other Ambulatory Visit: Payer: BLUE CROSS/BLUE SHIELD

## 2014-10-01 ENCOUNTER — Encounter: Payer: Self-pay | Admitting: Pulmonary Disease

## 2014-10-01 ENCOUNTER — Ambulatory Visit (INDEPENDENT_AMBULATORY_CARE_PROVIDER_SITE_OTHER): Payer: BLUE CROSS/BLUE SHIELD | Admitting: Pulmonary Disease

## 2014-10-01 VITALS — BP 138/86 | HR 77 | Ht 61.0 in | Wt 205.0 lb

## 2014-10-01 DIAGNOSIS — R059 Cough, unspecified: Secondary | ICD-10-CM

## 2014-10-01 DIAGNOSIS — R0600 Dyspnea, unspecified: Secondary | ICD-10-CM | POA: Diagnosis not present

## 2014-10-01 DIAGNOSIS — R05 Cough: Secondary | ICD-10-CM | POA: Diagnosis not present

## 2014-10-01 MED ORDER — BENZONATATE 200 MG PO CAPS: 200.0000 mg | ORAL_CAPSULE | Freq: Two times a day (BID) | ORAL | Status: DC

## 2014-10-01 NOTE — Assessment & Plan Note (Addendum)
She has what sounds like primarily upper airway cough as she does not have wheezing on exam and there is no clear evidence of an underlying lung problem.  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 I do not think that this is the cause of her cough.  She does not necessarily have symptoms of asthma as wheezing and shortness of breath are not prominent unless she's having a severe coughing attack. I do believe that she has continual postnasal drip from allergic rhinitis which is making this worse. Cyclical cough or irritable larynx is likely also contributing to the cough as well. She may have acid reflux though she denies heartburn symptoms.  Plan: Full pulmonary function testing to check for asthma Continue Breo for now Acid reflux lifestyle modification, start Prilosec Allergy testing for allergic rhinitis Voice rest encouraged Continue allergic rhinitis as prescribed now, but we went over the appropriate pharmacokinetics of Flonase Follow-up 3-4 weeks

## 2014-10-01 NOTE — Assessment & Plan Note (Signed)
She has some mild dyspnea but this is only with cough. She has been started on Breo recently, presumably for a diagnosis of asthma. As stated above, I'm not certain she has asthma.  Plan: Full pulmonary function testing.

## 2014-10-01 NOTE — Progress Notes (Signed)
Subjective:    Patient ID: Samantha Greene, female    DOB: [birth year], 44 y.o.   MRN: [id number]  HPI Chief Complaint  Patient presents with  . Advice Only    Referred for chronic cough X1 year.   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 She says that as a child she never had lung problems but she does report repeated exacerbations of upper respiratory infections over the years starting in childhood. She said that she would have frequent coughs, colds, episodes of bronchitis. She was never told that she had asthma. She says that since last August she started having significant sinus congestion, mucus production, cough, wheezing, and chest tightness. This also sometimes be associated with a sore throat. At the beginning 0 every having fevers and chills. 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 After that, she said that she finally decided to quit in June of this year but she has continued to have a cough. She says the cough is constant, though it does not occur at night. It appears to be a little bit worse in the mornings around the time she takes her Breo, but then her cough will improve somewhat until the afternoon. Eating does not seem to make a difference with the cough. It does not seem to be worse if she talks a lot. She notes a postnasal drip and takes allergy medications for that. She uses Flonase every day of the week in addition to Singulair and Claritin. She's unaware of any allergies but she says that she has had worsening sinus symptoms in the spring and fall in the past. She denies heartburn. She says that she gets short of breath when she has the coughing spells.   Past Medical History  Diagnosis Date  . Depression     no meds  . Hyperlipidemia   . Hypertension     no meds  . Anxiety      Family History  Problem Relation Age of Onset  .  Breast cancer Mother   . Lung cancer Mother   . Hypertension Mother   . Diabetes Mother   . Kidney disease Mother   . Hyperlipidemia Mother   . Prostate cancer    . Breast cancer Maternal Aunt   . Diabetes Maternal Aunt   . Hyperlipidemia Maternal Aunt   . Heart disease Maternal Aunt   . Hypertension Maternal Aunt   . Stroke Maternal Aunt   . Hyperlipidemia Brother   . Hypertension Brother   . Diabetes Brother   . Colon cancer Maternal Uncle   . Diabetes Maternal Uncle   . Hyperlipidemia Maternal Uncle   . Heart disease Maternal Uncle   . Hypertension Maternal Uncle   . Stroke Maternal Uncle   . Hemochromatosis Paternal Uncle   . Polycystic ovary syndrome Paternal Uncle   . Heart disease Maternal Grandmother     Needed pacemaker     Social History   Social History  . Marital Status: Married    Spouse Name: N/A  . Number of Children: N/A  . Years of Education: N/A   Occupational History  . Treatment Cordinator    Social History Main Topics  . Smoking status: Never Smoker   . Smokeless tobacco: Never Used  . Alcohol Use: No  . Drug Use: No  . Sexual Activity:  Yes    Birth Control/ Protection: None   Other Topics Concern  . Not on file   Social History Narrative     Allergies  Allergen Reactions  . Erythromycin Nausea Only and Other (See Comments)    Stomach cramping  . Flexeril [Cyclobenzaprine Hcl] Other (See Comments)    "don't like the way I feel"  . Metformin And Related     GI Upset     Outpatient Prescriptions Prior to Visit  Medication Sig Dispense Refill  . albuterol (PROVENTIL HFA;VENTOLIN HFA) 108 (90 BASE) MCG/ACT inhaler Inhale 2 puffs into the lungs every 2 (two) hours as needed for wheezing or shortness of breath (cough). 1 Inhaler 0  . Cholecalciferol (VITAMIN D) 2000 UNITS tablet Take 10,000 Units by mouth daily. Pt takes Finest Nutrition Bone Health    . Cyanocobalamin (VITAMIN B 12) 100 MCG LOZG Take by mouth.    . doxylamine, Sleep,  (UNISOM) 25 MG tablet Take 25 mg by mouth at bedtime as needed.    . fluticasone (FLONASE) 50 MCG/ACT nasal spray Place 2 sprays into both nostrils daily. 16 g 2  . montelukast (SINGULAIR) 10 MG tablet Take 1 tablet (10 mg total) by mouth daily. 30 tablet 2  . naproxen sodium (ANAPROX) 220 MG tablet Take 220 mg by mouth as needed.    Marland Kitchen OVER THE COUNTER MEDICATION Take 1 capsule by mouth at bedtime as needed. NatureMade Natural Sleep Aid. For sleep     . pravastatin (PRAVACHOL) 40 MG tablet Take 1 tablet (40 mg total) by mouth daily. 90 tablet 1  . prenatal vitamin w/FE, FA (PRENATAL 1 + 1) 27-1 MG TABS Take 1 tablet by mouth daily.      . sertraline (ZOLOFT) 100 MG tablet TAKE 1 TABLET BY MOUTH DAILY 30 tablet 3  . valsartan (DIOVAN) 80 MG tablet 1/2- 1 PILL DAILY FOR BLOOD PRESSURE 30 tablet 2   No facility-administered medications prior to visit.       Review of Systems  Constitutional: Negative for fever and unexpected weight change.  HENT: Positive for congestion, postnasal drip and sinus pressure. Negative for dental problem, ear pain, nosebleeds, rhinorrhea, sneezing, sore throat and trouble swallowing.   Eyes: Negative for redness and itching.  Respiratory: Positive for cough, chest tightness and shortness of breath. Negative for wheezing.   Cardiovascular: Negative for palpitations and leg swelling.  Gastrointestinal: Negative for nausea and vomiting.  Genitourinary: Negative for dysuria.  Musculoskeletal: Negative for joint swelling.  Skin: Negative for rash.  Neurological: Negative for headaches.  Hematological: Does not bruise/bleed easily.  Psychiatric/Behavioral: Negative for dysphoric mood. The patient is not nervous/anxious.        Objective:   Physical Exam Filed Vitals:   10/01/14 1034  BP: 138/86  Pulse: 77  Height:  (1.549 m)  Weight: 205 lb (92.987 kg)  SpO2: 98%   RA  Gen: frequent cough but, well appearing, no acute distress HENT: NCAT, OP  clear, neck supple without masses Eyes: PERRL, EOMi Lymph: no cervical lymphadenopathy PULM: CTA B CV: RRR, no mgr, no JVD GI: BS+, soft, nontender, no hsm Derm: no rash or skin breakdown MSK: normal bulk and tone Neuro: A&Ox4, CN II-XII intact, strength 5/5 in all 4 extremities Psyche: normal mood and affect   April 2016 chest x-ray images personally reviewed showing large right-sided eventration of the diaphragm Primary care physician notes reviewed her she was referred to me for cough.     Assessment &  Plan:  Cough She has what sounds like primarily upper airway cough as she does not have wheezing on exam and there is no clear evidence of an underlying lung problem.  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 I do not think that this is the cause of her cough.  She does not necessarily have symptoms of asthma as wheezing and shortness of breath are not prominent unless she's having a severe coughing attack. I do believe that she has continual postnasal drip from allergic rhinitis which is making this worse. Cyclical cough or irritable larynx is likely also contributing to the cough as well. She may have acid reflux though she denies heartburn symptoms.  Plan: Full pulmonary function testing to check for asthma Continue Breo for now Acid reflux lifestyle modification, start Prilosec Allergy testing for allergic rhinitis Continue allergic rhinitis as prescribed now, but we went over the appropriate pharmacokinetics of Flonase Follow-up 3-4 weeks  Dyspnea She has some mild dyspnea but this is only with cough. She has been started on Breo recently, presumably for a diagnosis of asthma. As stated above, I'm not certain she has asthma.  Plan: Full pulmonary function testing.     Current outpatient prescriptions:  .  albuterol (PROVENTIL HFA;VENTOLIN HFA) 108 (90 BASE)  MCG/ACT inhaler, Inhale 2 puffs into the lungs every 2 (two) hours as needed for wheezing or shortness of breath (cough)., Disp: 1 Inhaler, Rfl: 0 .  aspirin 81 MG tablet, Take 81 mg by mouth daily., Disp: , Rfl:  .  Cholecalciferol (VITAMIN D) 2000 UNITS tablet, Take 10,000 Units by mouth daily. Pt takes [employer] Bone Health, Disp: , Rfl:  .  Cyanocobalamin (VITAMIN B 12) 100 MCG LOZG, Take by mouth., Disp: , Rfl:  .  doxylamine, Sleep, (UNISOM) 25 MG tablet, Take 25 mg by mouth at bedtime as needed., Disp: , Rfl:  .  fluticasone (FLONASE) 50 MCG/ACT nasal spray, Place 2 sprays into both nostrils daily., Disp: 16 g, Rfl: 2 .  montelukast (SINGULAIR) 10 MG tablet, Take 1 tablet (10 mg total) by mouth daily., Disp: 30 tablet, Rfl: 2 .  naproxen sodium (ANAPROX) 220 MG tablet, Take 220 mg by mouth as needed., Disp: , Rfl:  .  OVER THE COUNTER MEDICATION, Take 1 capsule by mouth at bedtime as needed. NatureMade Natural Sleep Aid. For sleep , Disp: , Rfl:  .  pravastatin (PRAVACHOL) 40 MG tablet, Take 1 tablet (40 mg total) by mouth daily., Disp: 90 tablet, Rfl: 1 .  prenatal vitamin w/FE, FA (PRENATAL 1 + 1) 27-1 MG TABS, Take 1 tablet by mouth daily.  , Disp: , Rfl:  .  sertraline (ZOLOFT) 100 MG tablet, TAKE 1 TABLET BY MOUTH DAILY, Disp: 30 tablet, Rfl: 3 .  valsartan (DIOVAN) 80 MG tablet, 1/2- 1 PILL DAILY FOR BLOOD PRESSURE, Disp: 30 tablet, Rfl: 2

## 2014-10-01 NOTE — Patient Instructions (Signed)
For the sinuses: Use Neil Med rinses with distilled water at least twice per day using the instructions on the package. 1/2 hour after using the [facility] Med rinse, use Flonase two puffs in each nostril once per day.  Remember that the Flonase can take 1-2 weeks to work after regular use. Use generic zyrtec (cetirizine) OR CLARITIN every day.  If this doesn't help, then stop taking it and use chlorpheniramine-phenylephrine combination tablets.  We will check an allergy test and call you with the results  Follow the acid reflux lifestyle modification we provided you I recommended she take over-the-counter Prilosec daily  You need to try to suppress your cough to allow your larynx (voice box) to heal.  For three days don't talk, laugh, sing, or clear your throat. Do everything you can to suppress the cough during this time. Use hard candies (sugarless Jolly Ranchers) or non-mint or non-menthol containing cough drops during this time to soothe your throat.  Use a cough suppressant (Delsym or what I have prescribed you) around the clock during this time.  After three days, gradually increase the use of your voice and back off on the cough suppressants.  We will see you back in 3-4 weeks or sooner if needed

## 2014-10-02 LAB — ALLERGY FULL PROFILE
Allergen, D pternoyssinus,d7: <0.1 kU/L
Allergen,Goose feathers, e70: <0.1 kU/L
Bermuda Grass: <0.1 kU/L
Box Elder IgE: <0.1 kU/L
Cat Dander: <0.1 kU/L
Curvularia lunata: <0.1 kU/L
D. farinae: <0.1 kU/L
Dog Dander: <0.1 kU/L
Elm IgE: <0.1 kU/L
G009 Red Top: <0.1 kU/L
Goldenrod: <0.1 kU/L
Helminthosporium halodes: <0.1 kU/L
House Dust Hollister: <0.1 kU/L
IgE (Immunoglobulin E), Serum: 11 kU/L (ref <115)
Lamb's Quarters: <0.1 kU/L
Oak: <0.1 kU/L
Stemphylium Botryosum: <0.1 kU/L
Timothy Grass: <0.1 kU/L

## 2014-10-03 ENCOUNTER — Encounter: Payer: Self-pay | Admitting: Pulmonary Disease

## 2014-10-07 ENCOUNTER — Ambulatory Visit (HOSPITAL_COMMUNITY)
Admission: RE | Admit: 2014-10-07 | Discharge: 2014-10-07 | Disposition: A | Discharge status: Home / Self Care | Payer: BLUE CROSS/BLUE SHIELD | Source: Ambulatory Visit | Attending: Pulmonary Disease | Admitting: Pulmonary Disease

## 2014-10-07 DIAGNOSIS — R05 Cough: Secondary | ICD-10-CM | POA: Insufficient documentation

## 2014-10-07 DIAGNOSIS — R0600 Dyspnea, unspecified: Secondary | ICD-10-CM

## 2014-10-07 DIAGNOSIS — R059 Cough, unspecified: Secondary | ICD-10-CM

## 2014-10-07 LAB — PULMONARY FUNCTION TEST
DL/VA % pred: 137 %
DL/VA: 6.06 ml/min/mmHg/L
DLCO UNC % PRED: 97 %
DLCO UNC: 19.75 ml/min/mmHg
FEF 25-75 POST: 3.75 L/s
FEF 25-75 PRE: 3.18 L/s
FEF2575-%Change-Post: 18 %
FEF2575-%Pred-Post: 134 %
FEF2575-%Pred-Pre: 113 %
FEV1-%Change-Post: 9 %
FEV1-%PRED-PRE: 79 %
FEV1-%Pred-Post: 87 %
FEV1-POST: 2.3 L
FEV1-Pre: 2.1 L
FEV1FVC-%Change-Post: 4 %
FEV1FVC-%Pred-Pre: 109 %
FEV6-%Change-Post: 4 %
FEV6-%PRED-POST: 77 %
FEV6-%PRED-PRE: 74 %
FEV6-POST: 2.48 L
FEV6-Pre: 2.37 L
FEV6FVC-%PRED-POST: 102 %
FEV6FVC-%Pred-Pre: 102 %
FVC-%Change-Post: 4 %
FVC-%PRED-PRE: 72 %
FVC-%Pred-Post: 75 %
FVC-POST: 2.48 L
FVC-PRE: 2.37 L
PRE FEV1/FVC RATIO: 88 %
PRE FEV6/FVC RATIO: 100 %
Post FEV1/FVC ratio: 93 %
Post FEV6/FVC ratio: 100 %
RV % pred: 73 %
RV: 1.12 L
TLC % PRED: 79 %
TLC: 3.64 L

## 2014-10-07 MED ORDER — ALBUTEROL SULFATE (2.5 MG/3ML) 0.083% IN NEBU
2.5000 mg | INHALATION_SOLUTION | Freq: Once | RESPIRATORY_TRACT | Status: AC
  Administered 2014-10-07: 2.5 mg via RESPIRATORY_TRACT

## 2014-10-15 ENCOUNTER — Telehealth: Payer: Self-pay | Admitting: Pulmonary Disease

## 2014-10-15 NOTE — Telephone Encounter (Signed)
Called pt back Pt was returning ashley's phone call about her PFT results Informed pt that pe BQ's notes it states that she does not have asthma Pt voiced understanding of rec  Nothing further is needed at this time

## 2014-10-24 ENCOUNTER — Ambulatory Visit: Payer: BLUE CROSS/BLUE SHIELD | Admitting: Pulmonary Disease

## 2014-10-30 ENCOUNTER — Ambulatory Visit: Payer: BLUE CROSS/BLUE SHIELD | Admitting: Pulmonary Disease

## 2014-11-10 ENCOUNTER — Other Ambulatory Visit: Payer: Self-pay | Admitting: Internal Medicine

## 2014-11-26 ENCOUNTER — Other Ambulatory Visit: Payer: Self-pay | Admitting: Emergency Medicine

## 2014-12-15 ENCOUNTER — Ambulatory Visit: Payer: Self-pay | Admitting: Physician Assistant

## 2015-01-01 ENCOUNTER — Ambulatory Visit (INDEPENDENT_AMBULATORY_CARE_PROVIDER_SITE_OTHER): Payer: BLUE CROSS/BLUE SHIELD | Admitting: Physician Assistant

## 2015-01-01 ENCOUNTER — Encounter: Payer: Self-pay | Admitting: Physician Assistant

## 2015-01-01 VITALS — BP 130/90 | HR 97 | Temp 97.5°F | Resp 16 | Ht 61.0 in | Wt 203.0 lb

## 2015-01-01 DIAGNOSIS — J42 Unspecified chronic bronchitis: Secondary | ICD-10-CM

## 2015-01-01 DIAGNOSIS — E785 Hyperlipidemia, unspecified: Secondary | ICD-10-CM

## 2015-01-01 DIAGNOSIS — F329 Major depressive disorder, single episode, unspecified: Secondary | ICD-10-CM | POA: Diagnosis not present

## 2015-01-01 DIAGNOSIS — F32A Depression, unspecified: Secondary | ICD-10-CM

## 2015-01-01 DIAGNOSIS — R05 Cough: Secondary | ICD-10-CM | POA: Diagnosis not present

## 2015-01-01 DIAGNOSIS — H04123 Dry eye syndrome of bilateral lacrimal glands: Secondary | ICD-10-CM

## 2015-01-01 DIAGNOSIS — R059 Cough, unspecified: Secondary | ICD-10-CM

## 2015-01-01 DIAGNOSIS — Z23 Encounter for immunization: Secondary | ICD-10-CM | POA: Diagnosis not present

## 2015-01-01 DIAGNOSIS — E559 Vitamin D deficiency, unspecified: Secondary | ICD-10-CM | POA: Diagnosis not present

## 2015-01-01 DIAGNOSIS — I10 Essential (primary) hypertension: Secondary | ICD-10-CM | POA: Diagnosis not present

## 2015-01-01 LAB — CBC WITH DIFFERENTIAL/PLATELET
BASOS ABS: 0 10*3/uL (ref 0.0–0.1)
Basophils Relative: 0 % (ref 0–1)
EOS PCT: 1 % (ref 0–5)
Eosinophils Absolute: 0.1 10*3/uL (ref 0.0–0.7)
HEMATOCRIT: 36.9 % (ref 36.0–46.0)
Hemoglobin: 12.6 g/dL (ref 12.0–15.0)
Lymphocytes Relative: 30 % (ref 12–46)
Lymphs Abs: 2.2 10*3/uL (ref 0.7–4.0)
MCH: 31.7 pg (ref 26.0–34.0)
MCHC: 34.1 g/dL (ref 30.0–36.0)
MCV: 92.7 fL (ref 78.0–100.0)
MPV: 8.9 fL (ref 8.6–12.4)
Monocytes Absolute: 0.5 10*3/uL (ref 0.1–1.0)
Monocytes Relative: 7 % (ref 3–12)
NEUTROS PCT: 62 % (ref 43–77)
Neutro Abs: 4.6 10*3/uL (ref 1.7–7.7)
Platelets: 299 10*3/uL (ref 150–400)
RBC: 3.98 MIL/uL (ref 3.87–5.11)
RDW: 13.3 % (ref 11.5–15.5)
WBC: 7.4 10*3/uL (ref 4.0–10.5)

## 2015-01-01 LAB — BASIC METABOLIC PANEL WITH GFR
BUN: 14 mg/dL (ref 7–25)
CO2: 22 mmol/L (ref 20–31)
Calcium: 8.9 mg/dL (ref 8.6–10.2)
Chloride: 102 mmol/L (ref 98–110)
Creat: 1.01 mg/dL (ref 0.50–1.10)
GFR, EST AFRICAN AMERICAN: 78 mL/min (ref >=60)
GFR, EST NON AFRICAN AMERICAN: 68 mL/min (ref >=60)
GLUCOSE: 78 mg/dL (ref 65–99)
POTASSIUM: 4.1 mmol/L (ref 3.5–5.3)
Sodium: 136 mmol/L (ref 135–146)

## 2015-01-01 LAB — LIPID PANEL
Cholesterol: 189 mg/dL (ref 125–200)
HDL: 63 mg/dL (ref >=46)
LDL Cholesterol: 83 mg/dL (ref <130)
TRIGLYCERIDES: 217 mg/dL — AB (ref <150)
Total CHOL/HDL Ratio: 3 Ratio (ref <=5.0)
VLDL: 43 mg/dL — AB (ref <30)

## 2015-01-01 LAB — TSH: TSH: 2.318 u[IU]/mL (ref 0.350–4.500)

## 2015-01-01 LAB — HEPATIC FUNCTION PANEL
ALK PHOS: 49 U/L (ref 33–115)
ALT: 12 U/L (ref 6–29)
AST: 17 U/L (ref 10–30)
Albumin: 3.7 g/dL (ref 3.6–5.1)
BILIRUBIN INDIRECT: 0.2 mg/dL (ref 0.2–1.2)
Bilirubin, Direct: 0.1 mg/dL (ref <=0.2)
Total Bilirubin: 0.3 mg/dL (ref 0.2–1.2)
Total Protein: 6.8 g/dL (ref 6.1–8.1)

## 2015-01-01 LAB — MAGNESIUM: Magnesium: 2 mg/dL (ref 1.5–2.5)

## 2015-01-01 NOTE — Progress Notes (Signed)
Assessment and Plan:  1. Hypertension -Continue medication, monitor blood pressure at home. Continue DASH diet.  Reminder to go to the ER if any CP, SOB, nausea, dizziness, severe HA, changes vision/speech, left arm numbness and tingling and jaw pain.  2. Cholesterol -Continue diet and exercise. Check cholesterol.   3. Prediabetes  -Continue diet and exercise. Check A1C  4. Vitamin D Def - check level and continue medications.  5. Chronic infections Check for IGG def, has fluid on left ear, may need eval for tube placemend.   6. Dry eye/dry mouth Rule out autoimmune/sjogrens  7. Costochondritis RICE, aleve, reassured   Continue diet and meds as discussed. Further disposition pending results of labs. Over 30 minutes of exam, counseling, chart review, and critical decision making was performed  HPI 44 y.o. female  presents for 3 month follow up on hypertension, cholesterol, prediabetes, and vitamin D deficiency.   Her blood pressure has been controlled at home, today their BP is BP: 130/90 mmHg  She does not workout. She denies chest pain, shortness of breath, dizziness. Patient has history of recurrent colds/cough, recently referred to Dr. Kendrick FriesMcQuaid, normal PFTs, normal CXR, negative allergy testing.  She is a twin, was 3 weeks premature, states she has had recurrent infections since she was younger, feels she may have infection now, however also complains of dry eyes, dry mouth for long periods of time. Has chronic fluid on left ear, starting to affect hearing, has pressure.   She is on cholesterol medication, pravastatin and denies myalgias. Her cholesterol is at goal. The cholesterol last visit was:   Lab Results  Component Value Date   CHOL 197 06/11/2014   HDL 77 06/11/2014   LDLCALC 99 06/11/2014   TRIG 105 06/11/2014   CHOLHDL 2.6 06/11/2014   . Last A1C in the office was:  Lab Results  Component Value Date   HGBA1C 5.6 05/13/2014   Patient is on Vitamin D supplement.    Lab Results  Component Value Date   VD25OH 29* 05/13/2014     BMI is Body mass index is 38.38 kg/(m^2)., she is working on diet and exercise. Wt Readings from Last 3 Encounters:  01/01/15 203 lb (92.08 kg)  10/01/14 205 lb (92.987 kg)  09/24/14 208 lb (94.348 kg)    Current Medications:  Current Outpatient Prescriptions on File Prior to Visit  Medication Sig Dispense Refill  . albuterol (PROVENTIL HFA;VENTOLIN HFA) 108 (90 BASE) MCG/ACT inhaler Inhale 2 puffs into the lungs every 2 (two) hours as needed for wheezing or shortness of breath (cough). 1 Inhaler 0  . aspirin 81 MG tablet Take 81 mg by mouth daily.    . benzonatate (TESSALON) 200 MG capsule Take 1 capsule (200 mg total) by mouth 2 (two) times daily. 50 capsule 2  . Cholecalciferol (VITAMIN D) 2000 UNITS tablet Take 10,000 Units by mouth daily. Pt takes [employer] Nutrition Bone Health    . Cyanocobalamin (VITAMIN B 12) 100 MCG LOZG Take by mouth.    . doxylamine, Sleep, (UNISOM) 25 MG tablet Take 25 mg by mouth at bedtime as needed.    . fluticasone (FLONASE) 50 MCG/ACT nasal spray Place 2 sprays into both nostrils daily. 16 g 2  . montelukast (SINGULAIR) 10 MG tablet TAKE 1 TABLET (10 MG TOTAL) BY MOUTH DAILY. 30 tablet 2  . naproxen sodium (ANAPROX) 220 MG tablet Take 220 mg by mouth as needed.    Marland Kitchen. OVER THE COUNTER MEDICATION Take 1 capsule by mouth  at bedtime as needed. NatureMade Natural Sleep Aid. For sleep     . pravastatin (PRAVACHOL) 40 MG tablet TAKE 1 TABLET (40 MG TOTAL) BY MOUTH DAILY. 90 tablet 1  . prenatal vitamin w/FE, FA (PRENATAL 1 + 1) 27-1 MG TABS Take 1 tablet by mouth daily.      . sertraline (ZOLOFT) 100 MG tablet TAKE 1 TABLET BY MOUTH DAILY 30 tablet 3  . valsartan (DIOVAN) 80 MG tablet 1/2- 1 PILL DAILY FOR BLOOD PRESSURE 30 tablet 2   No current facility-administered medications on file prior to visit.   Medical History:  Past Medical History  Diagnosis Date  . Depression     no meds  .  Hyperlipidemia   . Hypertension     no meds  . Anxiety    Allergies:  Allergies  Allergen Reactions  . Erythromycin Nausea Only and Other (See Comments)    Stomach cramping  . Flexeril [Cyclobenzaprine Hcl] Other (See Comments)    "don't like the way I feel"  . Metformin And Related     GI Upset     Review of Systems:  Review of Systems  Constitutional: Positive for malaise/fatigue. Negative for fever, chills, weight loss and diaphoresis.  HENT: Positive for congestion and ear pain (left ear). Negative for sore throat.        Dry mouth/eyes  Eyes: Negative.   Respiratory: Positive for cough and wheezing. Negative for hemoptysis, sputum production and shortness of breath.   Cardiovascular: Positive for chest pain (left sided dull ache, nonexertional x 2 weeks, worse with cough). Negative for palpitations, orthopnea, claudication, leg swelling and PND.  Gastrointestinal: Negative for nausea and vomiting.  Skin: Negative.   Neurological: Negative for weakness.    Family history- Review and unchanged Social history- Review and unchanged Physical Exam: BP 130/90 mmHg  Pulse 97  Temp(Src) 97.5 F (36.4 C) (Temporal)  Resp 16  Ht  (1.549 m)  Wt 203 lb (92.08 kg)  BMI 38.38 kg/m2  SpO2 94% Wt Readings from Last 3 Encounters:  01/01/15 203 lb (92.08 kg)  10/01/14 205 lb (92.987 kg)  09/24/14 208 lb (94.348 kg)   General Appearance: Well nourished, in no apparent distress. Eyes: PERRLA, EOMs, conjunctiva no swelling or erythema Sinuses: No Frontal/maxillary tenderness ENT/Mouth: Ext aud canals clear, TMs without erythema, + bilateral effusion, left worse than right.  No erythema, swelling, or exudate on post pharynx.  Tonsils not swollen or erythematous. Hearing decreased Neck: Supple, thyroid normal.  Respiratory: Respiratory effort normal, BS decreased diffusely without rales, rhonchi, wheezing or stridor.  Chest: + chest wall tenderness left side between ribs  4-6 Cardio: RRR with no MRGs. Brisk peripheral pulses without edema.  Abdomen: Soft, + BS, obese,  Non tender, no guarding, rebound, hernias, masses. Lymphatics: Non tender without lymphadenopathy.  Musculoskeletal: Full ROM, 5/5 strength, Normal gait Skin: Warm, dry without rashes, lesions, ecchymosis.  Neuro: Cranial nerves intact. Normal muscle tone, no cerebellar symptoms. Psych: Awake and oriented X 3, normal affect, Insight and Judgment appropriate.    Quentin Mulling, PA-C 2:18 PM [facility]

## 2015-01-01 NOTE — Patient Instructions (Addendum)
Your ears and sinuses are connected by the eustachian tube. When your sinuses are inflamed, this can close off the tube and cause fluid to collect in your middle ear. This can then cause dizziness, popping, clicking, ringing, and echoing in your ears. This is often NOT an infection and does NOT require antibiotics, it is caused by inflammation so the treatments help the inflammation. This can take a long time to get better so please be patient.  Here are things you can do to help with this: - Try the Flonase or Nasonex. Remember to spray each nostril twice towards the outer part of your eye.  Do not sniff but instead pinch your nose and tilt your head back to help the medicine get into your sinuses.  The best time to do this is at bedtime.Stop if you get blurred vision or nose bleeds.  -While drinking fluids, pinch and hold nose close and swallow, to help open eustachian tubes to drain fluid behind ear drums. -Please pick one of the over the counter allergy medications below and take it once daily for allergies.  It will also help with fluid behind ear drums. Claritin or loratadine cheapest but likely the weakest  Zyrtec or certizine at night because it can make you sleepy The strongest is allegra or fexafinadine  Cheapest at [employer], [employer], [employer] -can use decongestant over the counter, please do not use if you have high blood pressure or certain heart conditions.   if worsening HA, changes vision/speech, imbalance, weakness go to the ER   .Costochondritis Costochondritis, sometimes called Tietze syndrome, is a swelling and irritation (inflammation) of the tissue (cartilage) that connects your ribs with your breastbone (sternum). It causes pain in the chest and rib area. Costochondritis usually goes away on its own over time. It can take up to 6 weeks or longer to get better, especially if you are unable to limit your activities. CAUSES  Some cases of costochondritis have no known cause. Possible  causes include:  Injury (trauma).  Exercise or activity such as lifting.  Severe coughing. SIGNS AND SYMPTOMS  Pain and tenderness in the chest and rib area.  Pain that gets worse when coughing or taking deep breaths.  Pain that gets worse with specific movements. DIAGNOSIS  Your health care provider will do a physical exam and ask about your symptoms. Chest X-rays or other tests may be done to rule out other problems. TREATMENT  Costochondritis usually goes away on its own over time. Your health care provider may prescribe medicine to help relieve pain. HOME CARE INSTRUCTIONS   Avoid exhausting physical activity. Try not to strain your ribs during normal activity. This would include any activities using chest, abdominal, and side muscles, especially if heavy weights are used.  Apply ice to the affected area for the first 2 days after the pain begins.  Put ice in a plastic bag.  Place a towel between your skin and the bag.  Leave the ice on for 20 minutes, 2-3 times a day.  Only take over-the-counter or prescription medicines as directed by your health care provider. SEEK MEDICAL CARE IF:  You have redness or swelling at the rib joints. These are signs of infection.  Your pain does not go away despite rest or medicine. SEEK IMMEDIATE MEDICAL CARE IF:   Your pain increases or you are very uncomfortable.  You have shortness of breath or difficulty breathing.  You cough up blood.  You have worse chest pains, sweating, or vomiting.  You have a fever or persistent symptoms for more than 2-3 days.  You have a fever and your symptoms suddenly get worse. MAKE SURE YOU:   Understand these instructions.  Will watch your condition.  Will get help right away if you are not doing well or get worse.   This information is not intended to replace advice given to you by your health care provider. Make sure you discuss any questions you have with your health care provider.     Document Released: 10/27/2004 Document Revised: 11/07/2012 Document Reviewed: 08/21/2012 Elsevier Interactive Patient Education 2016 [employer]   We want weight loss that will last so you should lose 1-2 pounds a week.  THAT IS IT! Please pick THREE things a month to change. Once it is a habit check off the item. Then pick another three items off the list to become habits.  If you are already doing a habit on the list GREAT!  Cross that item off! o Don't drink your calories. Ie, alcohol, soda, fruit juice, and sweet tea.  o Drink more water. Drink a glass when you feel hungry or before each meal.  o Eat breakfast - Complex carb and protein (likeDannon light and fit yogurt, oatmeal, fruit, eggs, [place] bacon). o Measure your cereal.  Eat no more than one cup a day. (ie [place]ashi) o Eat an apple a day. o Add a vegetable a day. o Try a new vegetable a month. o Use Pam! Stop using oil or butter to cook. o Don't finish your plate or use smaller plates. o Share your dessert. o Eat sugar free Jello for dessert or frozen grapes. o Don't eat 2-3 hours before bed. o Switch to whole wheat bread, pasta, and brown rice. o Make healthier choices when you eat out. No fries! o Pick baked chicken, NOT fried. o Don't forget to SLOW DOWN when you eat. It is not going anywhere.  o Take the stairs. o Park far away in the parking lot o [employer] Farm[employer] soup cans (or weights) for 10 minutes while watching TV. o Walk at work for 10 minutes during break. o Walk outside 1 time a week with your friend, kids, dog, or significant other. o Start a walking group at church. o Walk the mall as much as you can tolerate.  o Keep a food diary. o Weigh yourself daily. o Walk for 15 minutes 3 days per week. o Cook at home more often and eat out less.  If life happens and you go back to old habits, it is okay.  Just start over. You can do it!   If you experience chest pain, get short of breath, or tired during the exercise, please  stop immediately and inform your doctor.   Before you even begin to attack a weight-loss plan, it pays to remember this: You are not fat. You have fat. Losing weight isn't about blame or shame; it's simply another achievement to accomplish. Dieting is like any other skill-you have to buckle down and work at it. As long as you act in a smart, reasonable way, you'll ultimately get where you want to be. Here are some weight loss pearls for you.  1. It's Not a Diet. It's a Lifestyle Thinking of a diet as something you're on and suffering through only for the short term doesn't work. To shed weight and keep it off, you need to make permanent changes to the way you eat. It's OK to indulge occasionally, of course, but  if you cut calories temporarily and then revert to your old way of eating, you'll gain back the weight quicker than you can say yo-yo. Use it to lose it. Research shows that one of the best predictors of long-term weight loss is how many pounds you drop in the first month. For that reason, nutritionists often suggest being stricter for the first two weeks of your new eating strategy to build momentum. Cut out added sugar and alcohol and avoid unrefined carbs. After that, figure out how you can reincorporate them in a way that's healthy and maintainable.  2. There's a Right Way to Exercise Working out burns calories and fat and boosts your metabolism by building muscle. But those trying to lose weight are notorious for overestimating the number of calories they burn and underestimating the amount they take in. Unfortunately, your system is biologically programmed to hold on to extra pounds and that means when you start exercising, your body senses the deficit and ramps up its hunger signals. If you're not diligent, you'll eat everything you burn and then some. Use it to lose it. Cardio gets all the exercise glory, but strength and interval training are the real heroes. They help you build lean muscle,  which in turn increases your metabolism and calorie-burning ability 3. Don't Overreact to Mild Hunger Some people have a hard time losing weight because of hunger anxiety. To them, being hungry is bad-something to be avoided at all costs-so they carry snacks with them and eat when they don't need to. Others eat because they're stressed out or bored. While you never want to get to the point of being ravenous (that's when bingeing is likely to happen), a hunger pang, a craving, or the fact that it's 3:00 p.m. should not send you racing for the vending machine or obsessing about the energy bar in your purse. Ideally, you should put off eating until your stomach is growling and it's difficult to concentrate.  Use it to lose it. When you feel the urge to eat, use the HALT method. Ask yourself, Am I really hungry? Or am I angry or anxious, lonely or bored, or tired? If you're still not certain, try the apple test. If you're truly hungry, an apple should seem delicious; if it doesn't, something else is going on. Or you can try drinking water and making yourself busy, if you are still hungry try a healthy snack.  4. Not All Calories Are Created Equal The mechanics of weight loss are pretty simple: Take in fewer calories than you use for energy. But the kind of food you eat makes all the difference. Processed food that's high in saturated fat and refined starch or sugar can cause inflammation that disrupts the hormone signals that tell your brain you're full. The result: You eat a lot more.  Use it to lose it. Clean up your diet. Swap in whole, unprocessed foods, including vegetables, lean protein, and healthy fats that will fill you up and give you the biggest nutritional bang for your calorie buck. In a few weeks, as your brain starts receiving regular hunger and fullness signals once again, you'll notice that you feel less hungry overall and naturally start cutting back on the amount you eat.  5. Protein, Produce,  and Plant-Based Fats Are Your Weight-Loss Trinity Here's why eating the three Ps regularly will help you drop pounds. Protein fills you up. You need it to build lean muscle, which keeps your metabolism humming so that you can  torch more fat. People in a weight-loss program who ate double the recommended daily allowance for protein (about 110 grams for a 150-pound woman) lost 70 percent of their weight from fat, while people who ate the RDA lost only about 40 percent, one study found. Produce is packed with filling fiber. "It's very difficult to consume too many calories if you're eating a lot of vegetables. Example: Three cups of broccoli is a lot of food, yet only 93 calories. (Fruit is another story. It can be easy to overeat and can contain a lot of calories from sugar, so be sure to monitor your intake.) Plant-based fats like olive oil and those in avocados and nuts are healthy and extra satiating.  Use it to lose it. Aim to incorporate each of the three Ps into every meal and snack. People who eat protein throughout the day are able to keep weight off, according to a study in the American Journal of Clinical Nutrition. In addition to meat, poultry and seafood, good sources are beans, lentils, eggs, tofu, and yogurt. As for fat, keep portion sizes in check by measuring out salad dressing, oil, and nut butters (shoot for one to two tablespoons). Finally, eat veggies or a little fruit at every meal. People who did that consumed 308 fewer calories but didn't feel any hungrier than when they didn't eat more produce.  7. How You Eat Is As Important As What You Eat In order for your brain to register that you're full, you need to focus on what you're eating. Sit down whenever you eat, preferably at a table. Turn off the TV or computer, put down your phone, and look at your food. Smell it. Chew slowly, and don't put another bite on your fork until you swallow. When women ate lunch this attentively, they  consumed 30 percent less when snacking later than those who listened to an audiobook at lunchtime, according to a study in the [place] Journal of Nutrition. 8. Weighing Yourself Really Works The scale provides the best evidence about whether your efforts are paying off. Seeing the numbers tick up or down or stagnate is motivation to keep going-or to rethink your approach. A 2015 study at [facility] found that daily weigh-ins helped people lose more weight, keep it off, and maintain that loss, even after two years. Use it to lose it. Step on the scale at the same time every day for the best results. If your weight shoots up several pounds from one weigh-in to the next, don't freak out. Eating a lot of salt the night before or having your period is the likely culprit. The number should return to normal in a day or two. It's a steady climb that you need to do something about. 9. Too Much Stress and Too Little Sleep Are Your Enemies When you're tired and frazzled, your body cranks up the production of cortisol, the stress hormone that can cause carb cravings. Not getting enough sleep also boosts your levels of ghrelin, a hormone associated with hunger, while suppressing leptin, a hormone that signals fullness and satiety. People on a diet who slept only five and a half hours a night for two weeks lost 55 percent less fat and were hungrier than those who slept eight and a half hours, according to a study in the [place] Medical Association Journal. Use it to lose it. Prioritize sleep, aiming for seven hours or more a night, which research shows helps lower stress. And make sure you're getting  quality zzz's. If a snoring spouse or a fidgety cat wakes you up frequently throughout the night, you may end up getting the equivalent of just four hours of sleep, according to a study from [facility]. Keep pets out of the bedroom, and use a white-noise app to drown out snoring. 10. You Will Hit a  plateau-And You Can Bust Through It As you slim down, your body releases much less leptin, the fullness hormone.  If you're not strength training, start right now. Building muscle can raise your metabolism to help you overcome a plateau. To keep your body challenged and burning calories, incorporate new moves and more intense intervals into your workouts or add another sweat session to your weekly routine. Alternatively, cut an extra 100 calories or so a day from your diet. Now that you've lost weight, your body simply doesn't need as much fuel.

## 2015-01-02 LAB — VITAMIN D 25 HYDROXY (VIT D DEFICIENCY, FRACTURES): Vit D, 25-Hydroxy: 37 ng/mL (ref 30–100)

## 2015-01-02 LAB — SJOGRENS SYNDROME-B EXTRACTABLE NUCLEAR ANTIBODY: SSB (La) (ENA) Antibody, IgG: <1

## 2015-01-02 LAB — SJOGRENS SYNDROME-A EXTRACTABLE NUCLEAR ANTIBODY: SSA (Ro) (ENA) Antibody, IgG: <1

## 2015-01-02 LAB — ANTI-DNA ANTIBODY, DOUBLE-STRANDED: ds DNA Ab: <1 IU/mL

## 2015-01-02 LAB — ANA: Anti Nuclear Antibody(ANA): NEGATIVE

## 2015-01-03 ENCOUNTER — Emergency Department (HOSPITAL_BASED_OUTPATIENT_CLINIC_OR_DEPARTMENT_OTHER)
Admission: EM | Admit: 2015-01-03 | Discharge: 2015-01-03 | Disposition: A | Discharge status: Home / Self Care | Payer: BLUE CROSS/BLUE SHIELD | Attending: Emergency Medicine | Admitting: Emergency Medicine

## 2015-01-03 ENCOUNTER — Encounter (HOSPITAL_BASED_OUTPATIENT_CLINIC_OR_DEPARTMENT_OTHER): Payer: Self-pay

## 2015-01-03 DIAGNOSIS — Z7982 Long term (current) use of aspirin: Secondary | ICD-10-CM | POA: Diagnosis not present

## 2015-01-03 DIAGNOSIS — F419 Anxiety disorder, unspecified: Secondary | ICD-10-CM | POA: Diagnosis not present

## 2015-01-03 DIAGNOSIS — H9202 Otalgia, left ear: Secondary | ICD-10-CM | POA: Insufficient documentation

## 2015-01-03 DIAGNOSIS — F329 Major depressive disorder, single episode, unspecified: Secondary | ICD-10-CM | POA: Diagnosis not present

## 2015-01-03 DIAGNOSIS — Z7951 Long term (current) use of inhaled steroids: Secondary | ICD-10-CM | POA: Insufficient documentation

## 2015-01-03 DIAGNOSIS — E785 Hyperlipidemia, unspecified: Secondary | ICD-10-CM | POA: Insufficient documentation

## 2015-01-03 DIAGNOSIS — I10 Essential (primary) hypertension: Secondary | ICD-10-CM | POA: Diagnosis not present

## 2015-01-03 DIAGNOSIS — Z79899 Other long term (current) drug therapy: Secondary | ICD-10-CM | POA: Insufficient documentation

## 2015-01-03 MED ORDER — OXYMETAZOLINE HCL 0.05 % NA SOLN
1.0000 | Freq: Once | NASAL | Status: AC
  Administered 2015-01-03: 1 via NASAL
  Filled 2015-01-03: qty 15

## 2015-01-03 MED ORDER — OXYCODONE-ACETAMINOPHEN 5-325 MG PO TABS: 1.0000 | ORAL_TABLET | ORAL | Status: DC | PRN

## 2015-01-03 NOTE — ED Notes (Signed)
Patient here with increasing left sided earache and pressure to face x 3 days. Seen by MD Thursday and they told her she had fluid in ear and now having increased pressure and pain.

## 2015-01-03 NOTE — Discharge Instructions (Signed)
Earache An earache, also called otalgia, can be caused by many things. Pain from an earache can be sharp, dull, or burning. The pain may be temporary or constant. Earaches can be caused by problems with the ear, such as infection in either the middle ear or the ear canal, injury, impacted ear wax, middle ear pressure, or a foreign body in the ear. Ear pain can also result from problems in other areas. This is called referred pain. For example, pain can come from a sore throat, a tooth infection, or problems with the jaw or the joint between the jaw and the skull (temporomandibular joint, or TMJ). The cause of an earache is not always easy to identify. Watchful waiting may be appropriate for some earaches until a clear cause of the pain can be found. HOME CARE INSTRUCTIONS Watch your condition for any changes. The following actions may help to lessen any discomfort that you are feeling:  Take medicines only as directed by your health care provider. This includes ear drops.  Apply ice to your outer ear to help reduce pain.  Put ice in a plastic bag.  Place a towel between your skin and the bag.  Leave the ice on for 20 minutes, 2-3 times per day.  Do not put anything in your ear other than medicine that is prescribed by your health care provider.  Try resting in an upright position instead of lying down. This may help to reduce pressure in the middle ear and relieve pain.  Chew gum if it helps to relieve your ear pain.  Control any allergies that you have.  Keep all follow-up visits as directed by your health care provider. This is important. SEEK MEDICAL CARE IF:  Your pain does not improve within 2 days.  You have a fever.  You have new or worsening symptoms. SEEK IMMEDIATE MEDICAL CARE IF:  You have a severe headache.  You have a stiff neck.  You have difficulty swallowing.  You have redness or swelling behind your ear.  You have drainage from your ear.  You have hearing  loss.  You feel dizzy.   This information is not intended to replace advice given to you by your health care provider. Make sure you discuss any questions you have with your health care provider.   Document Released: 09/04/2003 Document Revised: 02/07/2014 Document Reviewed: 08/18/2013 Elsevier Interactive Patient Education 2016 Elsevier Inc.  

## 2015-01-03 NOTE — ED Provider Notes (Signed)
CSN: [id number]     Arrival date & time 01/03/15  0940 History   First MD Initiated Contact with Patient 01/03/15 1033     Chief Complaint  Patient presents with  . Otalgia     (Consider location/radiation/quality/duration/timing/severity/associated sxs/prior Treatment) Patient is a 44 y.o. female presenting with ear pain.  Otalgia  44 y.o. Female complaining of left ear pain began - patient states told fluid bilateral ears for one year.  States now with increased pain in left ear.  Patient with ongoing nasal congestion, no fever, cough. She has had symptoms for one year and followed by pulmonary.  She denies any recent changes except pain in left ear.  Denies sore throat.  Patient did not take diovan today- forgot. Pain is 7/10, no intervention tried.    Past Medical History  Diagnosis Date  . Depression     no meds  . Hyperlipidemia   . Hypertension     no meds  . Anxiety    Past Surgical History  Procedure Laterality Date  . Dilation and curettage of uterus    . Dilation and evacuation     Family History  Problem Relation Age of Onset  . Breast cancer Mother   . Lung cancer Mother   . Hypertension Mother   . Diabetes Mother   . Kidney disease Mother   . Hyperlipidemia Mother   . Prostate cancer    . Breast cancer Maternal Aunt   . Diabetes Maternal Aunt   . Hyperlipidemia Maternal Aunt   . Heart disease Maternal Aunt   . Hypertension Maternal Aunt   . Stroke Maternal Aunt   . Hyperlipidemia Brother   . Hypertension Brother   . Diabetes Brother   . Colon cancer Maternal Uncle   . Diabetes Maternal Uncle   . Hyperlipidemia Maternal Uncle   . Heart disease Maternal Uncle   . Hypertension Maternal Uncle   . Stroke Maternal Uncle   . Hemochromatosis Paternal Uncle   . Polycystic ovary syndrome Paternal Uncle   . Heart disease Maternal Grandmother     Needed pacemaker   Social History  Substance Use Topics  . Smoking status: Never Smoker   . Smokeless  tobacco: Never Used  . Alcohol Use: No   OB History    No data available     Review of Systems  HENT: Positive for ear pain.   All other systems reviewed and are negative.     Allergies  Erythromycin; Flexeril; and Metformin and related  Home Medications   Prior to Admission medications   Medication Sig Start Date End Date Taking? Authorizing Provider  loratadine (CLARITIN) 10 MG tablet Take 10 mg by mouth daily.   Yes Historical Provider, MD  aspirin 81 MG tablet Take 81 mg by mouth daily.    Historical Provider, MD  Cholecalciferol (VITAMIN D) 2000 UNITS tablet Take 10,000 Units by mouth daily. Pt takes Finest Nutrition Bone Health    Historical Provider, MD  Cyanocobalamin (VITAMIN B 12) 100 MCG LOZG Take by mouth.    Historical Provider, MD  doxylamine, Sleep, (UNISOM) 25 MG tablet Take 25 mg by mouth at bedtime as needed.    Historical Provider, MD  fluticasone (FLONASE) 50 MCG/ACT nasal spray Place 2 sprays into both nostrils daily. 05/13/14 05/13/15  Melissa Smith, PA-C  montelukast (SINGULAIR) 10 MG tablet TAKE 1 TABLET (10 MG TOTAL) BY MOUTH DAILY. 11/10/14   Quentin Mulling, PA-C  naproxen sodium (ANAPROX) 220 MG tablet Take  220 mg by mouth as needed.    Historical Provider, MD  OVER THE COUNTER MEDICATION Take 1 capsule by mouth at bedtime as needed. NatureMade Natural Sleep Aid. For sleep     Historical Provider, MD  pravastatin (PRAVACHOL) 40 MG tablet TAKE 1 TABLET (40 MG TOTAL) BY MOUTH DAILY. 11/26/14   Lucky CowboyWilliam McKeown, MD  sertraline (ZOLOFT) 100 MG tablet TAKE 1 TABLET BY MOUTH DAILY 07/07/14   Lucky CowboyWilliam McKeown, MD  valsartan (DIOVAN) 80 MG tablet 1/2- 1 PILL DAILY FOR BLOOD PRESSURE 07/14/14   Lucky CowboyWilliam McKeown, MD   BP 177/105 mmHg  Pulse 72  Temp(Src) 98.8 F (37.1 C) (Oral)  Resp 18  Ht 5\' 1"  (1.549 m)  Wt 92.08 kg  BMI 38.38 kg/m2  SpO2 99% Physical Exam  Constitutional: She is oriented to person, place, and time. She appears well-developed and  well-nourished.  HENT:  Head: Normocephalic.  Right Ear: External ear normal.  Left Ear: External ear normal.  Nose: Nose normal.  Mouth/Throat: Oropharynx is clear and moist.  Left tm pearly but retracted posterior  Eyes: Conjunctivae are normal. Pupils are equal, round, and reactive to light.  Neck: Normal range of motion. Neck supple.  Cardiovascular: Normal rate, regular rhythm, normal heart sounds and intact distal pulses.   Pulmonary/Chest: She is in respiratory distress.  Abdominal: Soft.  Musculoskeletal: Normal range of motion.  Neurological: She is alert and oriented to person, place, and time.  Skin: Skin is warm and dry.  Psychiatric: She has a normal mood and affect.  Nursing note and vitals reviewed.   ED Course  Procedures (including critical care time) Labs Review Labs Reviewed - No data to display  Imaging Review No results found. I have personally reviewed and evaluated these images and lab results as part of my medical decision-making.   EKG Interpretation None      MDM   Final diagnoses:  Otalgia, left    Otalgia for one year, symptoms worse now today.  No fluid or erythema noted.  TM retracted.  Plan afrin, 6 percocet.  Referred to Dr. Suszanne Connerseoh Hypertension- noncompliance with meds, advised recheck.    Margarita Grizzleanielle Macel Yearsley, MD 01/03/15 1101

## 2015-01-05 ENCOUNTER — Encounter: Payer: Self-pay | Admitting: Internal Medicine

## 2015-01-05 ENCOUNTER — Other Ambulatory Visit: Payer: Self-pay | Admitting: Internal Medicine

## 2015-01-05 ENCOUNTER — Encounter: Payer: Self-pay | Admitting: Physician Assistant

## 2015-01-05 DIAGNOSIS — H9209 Otalgia, unspecified ear: Secondary | ICD-10-CM

## 2015-01-05 LAB — IMMUNOFIXATION ELECTROPHORESIS
IgA: 131 mg/dL (ref 69–380)
IgG (Immunoglobin G), Serum: 840 mg/dL (ref 690–1700)
IgM, Serum: 61 mg/dL (ref 52–322)
Total Protein, Serum Electrophoresis: 6.8 g/dL (ref 6.0–8.3)

## 2015-01-15 ENCOUNTER — Ambulatory Visit: Payer: Self-pay | Admitting: Physician Assistant

## 2015-01-22 ENCOUNTER — Other Ambulatory Visit: Payer: Self-pay | Admitting: Physician Assistant

## 2015-03-04 ENCOUNTER — Other Ambulatory Visit: Payer: Self-pay | Admitting: Internal Medicine

## 2015-03-17 ENCOUNTER — Other Ambulatory Visit: Payer: Self-pay | Admitting: Otolaryngology

## 2015-03-17 DIAGNOSIS — J329 Chronic sinusitis, unspecified: Secondary | ICD-10-CM

## 2015-03-17 DIAGNOSIS — R0981 Nasal congestion: Secondary | ICD-10-CM

## 2015-03-20 ENCOUNTER — Inpatient Hospital Stay: Admission: RE | Admit: 2015-03-20 | Payer: BLUE CROSS/BLUE SHIELD | Source: Ambulatory Visit

## 2015-04-30 ENCOUNTER — Ambulatory Visit (INDEPENDENT_AMBULATORY_CARE_PROVIDER_SITE_OTHER): Payer: 59 | Admitting: Internal Medicine

## 2015-04-30 ENCOUNTER — Encounter: Payer: Self-pay | Admitting: Internal Medicine

## 2015-04-30 VITALS — BP 128/80 | HR 82 | Temp 98.2°F | Resp 16 | Ht 61.0 in | Wt 202.0 lb

## 2015-04-30 DIAGNOSIS — J0141 Acute recurrent pansinusitis: Secondary | ICD-10-CM | POA: Diagnosis not present

## 2015-04-30 MED ORDER — IPRATROPIUM BROMIDE 0.03 % NA SOLN: 2.0000 | Freq: Three times a day (TID) | NASAL | Status: DC

## 2015-04-30 MED ORDER — PROMETHAZINE-DM 6.25-15 MG/5ML PO SYRP: 5.0000 mL | ORAL_SOLUTION | Freq: Four times a day (QID) | ORAL | Status: DC | PRN

## 2015-04-30 MED ORDER — CHLORPHEN-PE-ACETAMINOPHEN 4-10-325 MG PO TABS: 1.0000 | ORAL_TABLET | Freq: Three times a day (TID) | ORAL | Status: DC | PRN

## 2015-04-30 MED ORDER — MOXIFLOXACIN HCL 400 MG PO TABS: 400.0000 mg | ORAL_TABLET | Freq: Every day | ORAL | Status: AC

## 2015-04-30 NOTE — Progress Notes (Signed)
HPI  Patient presents to the office for evaluation of cough and congestion.  It has been going on for 2 days.  Patient reports night > day, dry, barky, worse with lying down.  They also endorse change in voice, chills, fever, postnasal drip, shortness of breath and nasal congestion, sore throat, ear congestion..  They have tried sudafed and tylenol.  They report that nothing has worked.  They admits to other sick contacts.  Review of Systems  Constitutional: Positive for fever, chills and malaise/fatigue.  HENT: Positive for congestion, ear pain and sore throat.   Respiratory: Positive for cough and shortness of breath. Negative for sputum production and wheezing.   Cardiovascular: Negative for chest pain, palpitations and leg swelling.  Neurological: Positive for headaches.    PE:  Filed Vitals:   04/30/15 1557  BP: 128/80  Pulse: 82  Temp: 98.2 F (36.8 C)  Resp: 16    General:  Alert and non-toxic, WDWN, NAD HEENT: NCAT, PERLA, EOM normal, no occular discharge or erythema.  Nasal mucosal edema with sinus tenderness to palpation.  Oropharynx clear with minimal oropharyngeal edema and erythema.  Mucous membranes moist and pink. Neck:  Cervical adenopathy Chest:  RRR no MRGs.  Lungs clear to auscultation A&P with no wheezes rhonchi or rales.   Abdomen: +BS x 4 quadrants, soft, non-tender, no guarding, rigidity, or rebound. Skin: warm and dry no rash Neuro: A&Ox4, CN II-XII grossly intact  Assessment and Plan:   1. Acute recurrent pansinusitis -avelox -phenergan dm -cont existing allergy medications -norel ad - atrovent -stop afrin -can call in prednisone if needed

## 2015-05-18 ENCOUNTER — Encounter: Payer: Self-pay | Admitting: Internal Medicine

## 2015-05-20 ENCOUNTER — Encounter: Payer: Self-pay | Admitting: Internal Medicine

## 2015-06-03 ENCOUNTER — Other Ambulatory Visit: Payer: Self-pay | Admitting: Internal Medicine

## 2015-06-05 ENCOUNTER — Other Ambulatory Visit: Payer: Self-pay | Admitting: Otolaryngology

## 2015-06-05 DIAGNOSIS — R519 Headache, unspecified: Secondary | ICD-10-CM

## 2015-06-05 DIAGNOSIS — R0981 Nasal congestion: Secondary | ICD-10-CM

## 2015-06-05 DIAGNOSIS — R51 Headache: Principal | ICD-10-CM

## 2015-06-09 ENCOUNTER — Encounter: Payer: Self-pay | Admitting: Internal Medicine

## 2015-06-09 ENCOUNTER — Ambulatory Visit (INDEPENDENT_AMBULATORY_CARE_PROVIDER_SITE_OTHER): Payer: 59 | Admitting: Internal Medicine

## 2015-06-09 VITALS — BP 142/98 | HR 86 | Temp 98.2°F | Resp 18 | Ht 61.0 in | Wt 206.0 lb

## 2015-06-09 DIAGNOSIS — Z136 Encounter for screening for cardiovascular disorders: Secondary | ICD-10-CM | POA: Diagnosis not present

## 2015-06-09 DIAGNOSIS — Z130 Encounter for screening for diseases of the blood and blood-forming organs and certain disorders involving the immune mechanism: Secondary | ICD-10-CM

## 2015-06-09 DIAGNOSIS — E785 Hyperlipidemia, unspecified: Secondary | ICD-10-CM

## 2015-06-09 DIAGNOSIS — Z0000 Encounter for general adult medical examination without abnormal findings: Secondary | ICD-10-CM | POA: Diagnosis not present

## 2015-06-09 DIAGNOSIS — I10 Essential (primary) hypertension: Secondary | ICD-10-CM | POA: Diagnosis not present

## 2015-06-09 DIAGNOSIS — Z131 Encounter for screening for diabetes mellitus: Secondary | ICD-10-CM

## 2015-06-09 DIAGNOSIS — Z0001 Encounter for general adult medical examination with abnormal findings: Secondary | ICD-10-CM

## 2015-06-09 DIAGNOSIS — E559 Vitamin D deficiency, unspecified: Secondary | ICD-10-CM

## 2015-06-09 DIAGNOSIS — G4700 Insomnia, unspecified: Secondary | ICD-10-CM

## 2015-06-09 DIAGNOSIS — R3915 Urgency of urination: Secondary | ICD-10-CM

## 2015-06-09 LAB — CBC WITH DIFFERENTIAL/PLATELET
Basophils Absolute: 0 cells/uL (ref 0–200)
Basophils Relative: 0 %
EOS ABS: 65 {cells}/uL (ref 15–500)
Eosinophils Relative: 1 %
HEMATOCRIT: 38.8 % (ref 35.0–45.0)
HEMOGLOBIN: 13 g/dL (ref 11.7–15.5)
LYMPHS ABS: 2210 {cells}/uL (ref 850–3900)
Lymphocytes Relative: 34 %
MCH: 31.5 pg (ref 27.0–33.0)
MCHC: 33.5 g/dL (ref 32.0–36.0)
MCV: 93.9 fL (ref 80.0–100.0)
MONO ABS: 520 {cells}/uL (ref 200–950)
MPV: 9 fL (ref 7.5–12.5)
Monocytes Relative: 8 %
NEUTROS PCT: 57 %
Neutro Abs: 3705 cells/uL (ref 1500–7800)
Platelets: 304 10*3/uL (ref 140–400)
RBC: 4.13 MIL/uL (ref 3.80–5.10)
RDW: 13 % (ref 11.0–15.0)
WBC: 6.5 10*3/uL (ref 3.8–10.8)

## 2015-06-09 MED ORDER — TRAZODONE HCL 100 MG PO TABS: 100.0000 mg | ORAL_TABLET | Freq: Every day | ORAL | Status: DC

## 2015-06-09 MED ORDER — BUPROPION HCL ER (XL) 150 MG PO TB24: 150.0000 mg | ORAL_TABLET | ORAL | Status: DC

## 2015-06-09 NOTE — Patient Instructions (Addendum)
Recommend that you see [facility].  Address: [address]     [place], [place] [address] [phone]   Dr. Vilinda FlakeEdward Lurey [address], [place] [address]  [phone]  Increase diovan or losartan to a full tablet daily.

## 2015-06-09 NOTE — Progress Notes (Signed)
Complete Physical  Assessment and Plan:   1. Encounter for general adult medical examination with abnormal findings  - CBC with Differential/Platelet - BASIC METABOLIC PANEL WITH GFR - Hepatic function panel - Magnesium  2. Essential hypertension -increase to full tablet of valsartan - Urinalysis, Routine w reflex microscopic (not at [facility]) - Microalbumin / creatinine urine ratio - EKG 12-Lead - TSH  3. Hyperlipidemia -cont meds - Lipid panel  4. Screening for diabetes mellitus  - Hemoglobin A1c - Insulin, random  5. Urinary urgency  - Urine culture  6. Insomnia -trazodone  7. Vitamin D deficiency  - VITAMIN D 25 Hydroxy (Vit-D Deficiency, Fractures)  8. Screening, deficiency anemia, iron  - Iron and TIBC - Vitamin B12  9.  Depression -appears to be worsening -referral to counselors -cont zoloft 100 mg -wellbutrin  Discussed med's effects and SE's. Screening labs and tests as requested with regular follow-up as recommended.  HPI  45 y.o. female  presents for a complete physical.  Her blood pressure has been controlled at home, today their BP is BP: (!) 142/98 mmHg.  She does workout. She denies chest pain, shortness of breath, dizziness. She reports that her blood pressure is generally 140/90 at home.    She is on cholesterol medication and denies myalgias. Her cholesterol is at goal. The cholesterol last visit was:  Lab Results  Component Value Date   CHOL 189 01/01/2015   HDL 63 01/01/2015   LDLCALC 83 01/01/2015   TRIG 217* 01/01/2015   CHOLHDL 3.0 01/01/2015  .  She has been working on diet and exercise for prediabetes, she is on bASA, she is on ACE/ARB and denies foot ulcerations, hyperglycemia, hypoglycemia , increased appetite, nausea, paresthesia of the feet, polydipsia, polyuria, visual disturbances, vomiting and weight loss. Last A1C in the office was:  Lab Results  Component Value Date   HGBA1C 5.6 05/13/2014    Patient is on Vitamin  D supplement.   Lab Results  Component Value Date   VD25OH 37 01/01/2015     Patient reports that she feels like her antidepressant does not feel like it is working much any more.    She reports that she is having some urinary and fecal urgency.  She reports that she urinates sometimes 4-5 times per day where as other times she goes all day long.  She reports that she does have the same thing with bowel movements.    She reports that her stools generally run on the looser side.  She reports that there is a strong family history of IBS, colitis, and chrons disease.    Due to have mammogram and visit to Obgyn in July.    Current Medications:  Current Outpatient Prescriptions on File Prior to Visit  Medication Sig Dispense Refill  . aspirin 81 MG tablet Take 81 mg by mouth daily.    . Chlorphen-PE-Acetaminophen 4-10-325 MG TABS Take 1 tablet by mouth 3 (three) times daily as needed. 30 tablet 0  . Cholecalciferol (VITAMIN D) 2000 UNITS tablet Take 10,000 Units by mouth daily. Pt takes Finest Nutrition Bone Health    . Cyanocobalamin (VITAMIN B 12) 100 MCG LOZG Take by mouth.    . doxylamine, Sleep, (UNISOM) 25 MG tablet Take 25 mg by mouth at bedtime as needed.    . fluticasone (FLONASE) 50 MCG/ACT nasal spray Place 2 sprays into both nostrils daily. 16 g 2  . ipratropium (ATROVENT) 0.03 % nasal spray Place 2 sprays into the nose  3 (three) times daily. 30 mL 2  . loratadine (CLARITIN) 10 MG tablet Take 10 mg by mouth daily.    . montelukast (SINGULAIR) 10 MG tablet TAKE 1 TABLET (10 MG TOTAL) BY MOUTH DAILY. 90 tablet 1  . naproxen sodium (ANAPROX) 220 MG tablet Take 220 mg by mouth as needed.    Marland Kitchen OVER THE COUNTER MEDICATION Take 1 capsule by mouth at bedtime as needed. NatureMade Natural Sleep Aid. For sleep     . pravastatin (PRAVACHOL) 40 MG tablet TAKE 1 TABLET BY MOUTH EVERY DAY 90 tablet 1  . promethazine-dextromethorphan (PROMETHAZINE-DM) 6.25-15 MG/5ML syrup Take 5 mLs by mouth 4  (four) times daily as needed for cough. 473 mL 0  . sertraline (ZOLOFT) 100 MG tablet TAKE 1 TABLET BY MOUTH DAILY 30 tablet 3  . valsartan (DIOVAN) 80 MG tablet 1/2- 1 PILL DAILY FOR BLOOD PRESSURE 30 tablet 2   No current facility-administered medications on file prior to visit.    Health Maintenance:   Immunization History  Administered Date(s) Administered  . Influenza, Seasonal, Injecte, Preservative Fre 01/01/2015  . Influenza,inj,Quad PF,36+ Mos 10/25/2012  . PPD Test 04/11/2013  . Pneumococcal Polysaccharide-23 03/28/2012  . Tdap 12/12/2012    Tetanus: 2014 Pneumovax: 2014 Flu vaccine: 2016 Pap: Due with Levar July MGM: Dr. Carman Ching  Last Dental Exam: Yearly visit  Patient Care Team: Lucky Cowboy, MD as PCP - General (Internal Medicine) Antionette Char, MD as Consulting Physician (Obstetrics and Gynecology) Cindee Salt, MD as Consulting Physician (Orthopedic Surgery)  Allergies:  Allergies  Allergen Reactions  . Erythromycin Nausea Only and Other (See Comments)    Stomach cramping  . Flexeril [Cyclobenzaprine Hcl] Other (See Comments)    "don't like the way I feel"  . Metformin And Related     GI Upset    Medical History:  Past Medical History  Diagnosis Date  . Depression     no meds  . Hyperlipidemia   . Hypertension     no meds  . Anxiety     Surgical History:  Past Surgical History  Procedure Laterality Date  . Dilation and curettage of uterus    . Dilation and evacuation      Family History:  Family History  Problem Relation Age of Onset  . Breast cancer Mother   . Lung cancer Mother   . Hypertension Mother   . Diabetes Mother   . Kidney disease Mother   . Hyperlipidemia Mother   . Prostate cancer    . Breast cancer Maternal Aunt   . Diabetes Maternal Aunt   . Hyperlipidemia Maternal Aunt   . Heart disease Maternal Aunt   . Hypertension Maternal Aunt   . Stroke Maternal Aunt   . Hyperlipidemia Brother   . Hypertension Brother    . Diabetes Brother   . Colon cancer Maternal Uncle   . Diabetes Maternal Uncle   . Hyperlipidemia Maternal Uncle   . Heart disease Maternal Uncle   . Hypertension Maternal Uncle   . Stroke Maternal Uncle   . Hemochromatosis Paternal Uncle   . Polycystic ovary syndrome Paternal Uncle   . Heart disease Maternal Grandmother     Needed pacemaker    Social History:  Social History  Substance Use Topics  . Smoking status: Never Smoker   . Smokeless tobacco: Never Used  . Alcohol Use: No    Review of Systems: Review of Systems  Constitutional: Negative for fever, chills and malaise/fatigue.  HENT: Positive for  congestion and sore throat. Negative for ear pain.   Eyes: Negative.   Respiratory: Positive for cough. Negative for shortness of breath and wheezing.   Cardiovascular: Negative for chest pain, palpitations and leg swelling.  Gastrointestinal: Positive for diarrhea. Negative for heartburn, abdominal pain, constipation, blood in stool and melena.  Genitourinary: Positive for urgency. Negative for dysuria, frequency and hematuria.  Neurological: Negative for dizziness, loss of consciousness and headaches.  Psychiatric/Behavioral: Negative for depression. The patient has insomnia. The patient is not nervous/anxious.     Physical Exam: Estimated body mass index is 38.94 kg/(m^2) as calculated from the following:   Height as of this encounter: 5\' 1"  (1.549 m).   Weight as of this encounter: 206 lb (93.441 kg). BP 142/98 mmHg  Pulse 86  Temp(Src) 98.2 F (36.8 C) (Temporal)  Resp 18  Ht 5\' 1"  (1.549 m)  Wt 206 lb (93.441 kg)  BMI 38.94 kg/m2  General Appearance: Well nourished well developed, in no apparent distress.  Eyes: PERRLA, EOMs, conjunctiva no swelling or erythema ENT/Mouth: Ear canals normal without obstruction, swelling, erythema, or discharge.  TMs normal bilaterally with no erythema, bulging, retraction, or loss of landmark.  Oropharynx moist and clear with no  exudate, erythema, or swelling.  Neck: Supple, thyroid normal. No bruits.  No cervical adenopathy Respiratory: Respiratory effort normal, Breath sounds clear A&P without wheeze, rhonchi, rales.   Cardio: RRR without murmurs, rubs or gallops. Brisk peripheral pulses without edema.  Chest: symmetric, with normal excursions Breasts: Symmetric, without lumps, nipple discharge, retractions.  Abdomen: Soft, nontender, no guarding, rebound, hernias, masses, or organomegaly.  Lymphatics: Non tender without lymphadenopathy.  Musculoskeletal: Full ROM all peripheral extremities,5/5 strength, and normal gait.  Skin: Warm, dry without rashes, lesions, ecchymosis. Neuro: Awake and oriented X 3, Cranial nerves intact, reflexes equal bilaterally. Normal muscle tone, no cerebellar symptoms. Sensation intact.  Psych:  normal affect, Insight and Judgment appropriate.   EKG: WNL no changes.  Over 40 minutes of exam, counseling, chart review and critical decision making was performed  Terri Piedraourtney Forcucci 3:16 PM [facility]

## 2015-06-10 ENCOUNTER — Ambulatory Visit
Admission: RE | Admit: 2015-06-10 | Discharge: 2015-06-10 | Disposition: A | Discharge status: Home / Self Care | Payer: 59 | Source: Ambulatory Visit | Attending: Otolaryngology | Admitting: Otolaryngology

## 2015-06-10 DIAGNOSIS — R51 Headache: Principal | ICD-10-CM

## 2015-06-10 DIAGNOSIS — R0981 Nasal congestion: Secondary | ICD-10-CM

## 2015-06-10 DIAGNOSIS — R519 Headache, unspecified: Secondary | ICD-10-CM

## 2015-06-10 LAB — BASIC METABOLIC PANEL WITH GFR
BUN: 17 mg/dL (ref 7–25)
CO2: 24 mmol/L (ref 20–31)
Calcium: 9.4 mg/dL (ref 8.6–10.2)
Chloride: 101 mmol/L (ref 98–110)
Creat: 1 mg/dL (ref 0.50–1.10)
GFR, EST NON AFRICAN AMERICAN: 68 mL/min (ref >=60)
GFR, Est African American: 79 mL/min (ref >=60)
GLUCOSE: 80 mg/dL (ref 65–99)
POTASSIUM: 4.5 mmol/L (ref 3.5–5.3)
Sodium: 138 mmol/L (ref 135–146)

## 2015-06-10 LAB — IRON AND TIBC
%SAT: 17 % (ref 11–50)
IRON: 88 ug/dL (ref 40–190)
TIBC: 524 ug/dL — ABNORMAL HIGH (ref 250–450)
UIBC: 436 ug/dL — AB (ref 125–400)

## 2015-06-10 LAB — URINALYSIS, MICROSCOPIC ONLY
CASTS: NONE SEEN [LPF]
Crystals: NONE SEEN [HPF]
RBC / HPF: NONE SEEN RBC/HPF (ref <=2)
WBC, UA: NONE SEEN WBC/HPF (ref <=5)
YEAST: NONE SEEN [HPF]

## 2015-06-10 LAB — HEPATIC FUNCTION PANEL
ALK PHOS: 52 U/L (ref 33–115)
ALT: 11 U/L (ref 6–29)
AST: 15 U/L (ref 10–35)
Albumin: 3.9 g/dL (ref 3.6–5.1)
BILIRUBIN DIRECT: 0.1 mg/dL (ref <=0.2)
BILIRUBIN INDIRECT: 0.2 mg/dL (ref 0.2–1.2)
TOTAL PROTEIN: 7 g/dL (ref 6.1–8.1)
Total Bilirubin: 0.3 mg/dL (ref 0.2–1.2)

## 2015-06-10 LAB — VITAMIN D 25 HYDROXY (VIT D DEFICIENCY, FRACTURES): VIT D 25 HYDROXY: 34 ng/mL (ref 30–100)

## 2015-06-10 LAB — LIPID PANEL
CHOL/HDL RATIO: 2.7 ratio (ref <=5.0)
Cholesterol: 202 mg/dL — ABNORMAL HIGH (ref 125–200)
HDL: 74 mg/dL (ref >=46)
LDL CALC: 91 mg/dL (ref <130)
Triglycerides: 186 mg/dL — ABNORMAL HIGH (ref <150)
VLDL: 37 mg/dL — AB (ref <30)

## 2015-06-10 LAB — MICROALBUMIN / CREATININE URINE RATIO
CREATININE, URINE: 117 mg/dL (ref 20–320)
MICROALB/CREAT RATIO: 12 ug/mg{creat} (ref <30)
Microalb, Ur: 1.4 mg/dL

## 2015-06-10 LAB — HEMOGLOBIN A1C
Hgb A1c MFr Bld: 5.5 % (ref <5.7)
MEAN PLASMA GLUCOSE: 111 mg/dL

## 2015-06-10 LAB — URINALYSIS, ROUTINE W REFLEX MICROSCOPIC
BILIRUBIN URINE: NEGATIVE
GLUCOSE, UA: NEGATIVE
Ketones, ur: NEGATIVE
Leukocytes, UA: NEGATIVE
Nitrite: NEGATIVE
PH: 7 (ref 5.0–8.0)
PROTEIN: NEGATIVE
SPECIFIC GRAVITY, URINE: 1.022 (ref 1.001–1.035)

## 2015-06-10 LAB — TSH: TSH: 2.73 mIU/L

## 2015-06-10 LAB — VITAMIN B12: VITAMIN B 12: 738 pg/mL (ref 200–1100)

## 2015-06-10 LAB — MAGNESIUM: Magnesium: 2.3 mg/dL (ref 1.5–2.5)

## 2015-06-10 LAB — INSULIN, RANDOM: Insulin: 34.2 u[IU]/mL — ABNORMAL HIGH (ref 2.0–19.6)

## 2015-06-11 LAB — URINE CULTURE

## 2015-06-15 ENCOUNTER — Encounter: Payer: Self-pay | Admitting: Internal Medicine

## 2015-07-02 ENCOUNTER — Ambulatory Visit: Payer: Self-pay | Admitting: Internal Medicine

## 2015-07-05 ENCOUNTER — Other Ambulatory Visit: Payer: Self-pay | Admitting: Internal Medicine

## 2015-07-06 ENCOUNTER — Ambulatory Visit: Payer: Self-pay | Admitting: Physician Assistant

## 2015-07-13 ENCOUNTER — Other Ambulatory Visit: Payer: Self-pay | Admitting: Internal Medicine

## 2015-08-13 ENCOUNTER — Other Ambulatory Visit: Payer: Self-pay | Admitting: Physician Assistant

## 2015-08-28 ENCOUNTER — Other Ambulatory Visit: Payer: Self-pay | Admitting: Internal Medicine

## 2015-08-31 ENCOUNTER — Ambulatory Visit (INDEPENDENT_AMBULATORY_CARE_PROVIDER_SITE_OTHER): Payer: 59 | Admitting: Internal Medicine

## 2015-08-31 ENCOUNTER — Ambulatory Visit (HOSPITAL_COMMUNITY)
Admission: RE | Admit: 2015-08-31 | Discharge: 2015-08-31 | Disposition: A | Discharge status: Home / Self Care | Payer: 59 | Source: Ambulatory Visit | Attending: Internal Medicine | Admitting: Internal Medicine

## 2015-08-31 ENCOUNTER — Encounter: Payer: Self-pay | Admitting: Internal Medicine

## 2015-08-31 VITALS — BP 110/80 | HR 72 | Temp 97.7°F | Resp 16 | Ht 61.0 in | Wt 205.0 lb

## 2015-08-31 DIAGNOSIS — R05 Cough: Secondary | ICD-10-CM | POA: Insufficient documentation

## 2015-08-31 DIAGNOSIS — R5383 Other fatigue: Secondary | ICD-10-CM | POA: Diagnosis not present

## 2015-08-31 DIAGNOSIS — J90 Pleural effusion, not elsewhere classified: Secondary | ICD-10-CM | POA: Insufficient documentation

## 2015-08-31 DIAGNOSIS — R509 Fever, unspecified: Secondary | ICD-10-CM | POA: Insufficient documentation

## 2015-08-31 LAB — POCT INFLUENZA A/B
INFLUENZA A, POC: NEGATIVE
Influenza B, POC: NEGATIVE

## 2015-08-31 MED ORDER — LEVOFLOXACIN 750 MG PO TABS: 750.0000 mg | ORAL_TABLET | Freq: Every day | ORAL | 0 refills | Status: DC

## 2015-08-31 MED ORDER — PREDNISONE 20 MG PO TABS: ORAL_TABLET | ORAL | 0 refills | Status: DC

## 2015-08-31 NOTE — Progress Notes (Signed)
HPI  Patient presents to the office for evaluation of fever, headache and fatigue.  It has been going on for 5 days.  Patient reports night > day, dry, worse with lying down.  They also endorse change in voice, chills, fever, shortness of breath, wheezing and left ear pain, .   No sick contacts other than some children at home.  She did take 600 mg of advil at 10:30.   They have tried norel ad, tylenol, advil.  They report that nothing has worked.  They admits to other sick contacts.  Review of Systems  Constitutional: Positive for chills, fever and malaise/fatigue.  HENT: Positive for congestion, ear pain and sore throat. Negative for ear discharge.   Cardiovascular: Negative for chest pain and palpitations.  Gastrointestinal: Positive for nausea. Negative for blood in stool, constipation, diarrhea, heartburn, melena and vomiting.  Genitourinary: Positive for dysuria. Negative for frequency, hematuria and urgency.  Neurological: Positive for headaches.  Psychiatric/Behavioral: Negative for depression. The patient is not nervous/anxious and does not have insomnia.     PE:  Vitals:   08/31/15 1144  BP: 110/80  Pulse: 72  Resp: 16  Temp: 97.7 F (36.5 C)    General:  Alert and non-toxic, WDWN, NAD HEENT: NCAT, PERLA, EOM normal, no occular discharge or erythema.  Nasal mucosal edema with sinus tenderness to palpation.  Oropharynx clear with minimal oropharyngeal edema and erythema.  Mucous membranes moist and pink. Neck:  Cervical adenopathy Chest:  RRR no MRGs.  Lungs clear to auscultation A&P with no wheezes rhonchi or rales.   Abdomen: +BS x 4 quadrants, soft, non-tender, no guarding, rigidity, or rebound. Skin: warm and dry no rash Neuro: A&Ox4, CN II-XII grossly intact  Assessment and Plan:   1. Fever, unspecified -rapid flu test is negative.   -some dysuria but could be dehydration from fever -will provide coverage -alternate tylenol and ibuprofen - DG Chest 2 View;  Future - Urinalysis, Routine w reflex microscopic (not at [facility]) - Culture, Urine - levofloxacin (LEVAQUIN) 750 MG tablet; Take 1 tablet (750 mg total) by mouth daily. X 7 days  Dispense: 7 tablet; Refill: 0 - predniSONE (DELTASONE) 20 MG tablet; 3 tabs po daily x 3 days, then 2 tabs x 3 days, then 1.5 tabs x 3 days, then 1 tab x 3 days, then 0.5 tabs x 3 days  Dispense: 27 tablet; Refill: 0

## 2015-09-01 LAB — URINALYSIS, ROUTINE W REFLEX MICROSCOPIC
BILIRUBIN URINE: NEGATIVE
Glucose, UA: NEGATIVE
KETONES UR: NEGATIVE
Leukocytes, UA: NEGATIVE
NITRITE: NEGATIVE
PH: 6.5 (ref 5.0–8.0)
Specific Gravity, Urine: 1.011 (ref 1.001–1.035)

## 2015-09-01 LAB — URINALYSIS, MICROSCOPIC ONLY
CASTS: NONE SEEN [LPF]
CRYSTALS: NONE SEEN [HPF]
RBC / HPF: NONE SEEN RBC/HPF (ref <=2)
WBC, UA: NONE SEEN WBC/HPF (ref <=5)
YEAST: NONE SEEN [HPF]

## 2015-09-02 LAB — URINE CULTURE

## 2015-09-04 ENCOUNTER — Other Ambulatory Visit: Payer: Self-pay | Admitting: Internal Medicine

## 2015-09-10 ENCOUNTER — Ambulatory Visit (INDEPENDENT_AMBULATORY_CARE_PROVIDER_SITE_OTHER): Payer: 59 | Admitting: Internal Medicine

## 2015-09-10 ENCOUNTER — Encounter: Payer: Self-pay | Admitting: Internal Medicine

## 2015-09-10 VITALS — BP 118/60 | HR 82 | Temp 98.0°F | Resp 16 | Ht 61.0 in | Wt 200.0 lb

## 2015-09-10 DIAGNOSIS — I10 Essential (primary) hypertension: Secondary | ICD-10-CM | POA: Diagnosis not present

## 2015-09-10 DIAGNOSIS — E785 Hyperlipidemia, unspecified: Secondary | ICD-10-CM | POA: Diagnosis not present

## 2015-09-10 DIAGNOSIS — K219 Gastro-esophageal reflux disease without esophagitis: Secondary | ICD-10-CM

## 2015-09-10 DIAGNOSIS — Z79899 Other long term (current) drug therapy: Secondary | ICD-10-CM

## 2015-09-10 MED ORDER — OMEPRAZOLE 40 MG PO CPDR: 40.0000 mg | DELAYED_RELEASE_CAPSULE | Freq: Every day | ORAL | 1 refills | Status: DC

## 2015-09-10 NOTE — Progress Notes (Signed)
Assessment and Plan:  Hypertension:  -Continue medication,  -monitor blood pressure at home.  -Continue DASH diet.   -Reminder to go to the ER if any CP, SOB, nausea, dizziness, severe HA, changes vision/speech, left arm numbness and tingling, and jaw pain.  Cholesterol: -Continue diet and exercise.   Pre-diabetes: -Continue diet and exercise.   Vitamin D Def: -continue medications.   GERD -cont omeprazole -avoid overeating -elevate HOB  Continue diet and meds as discussed. Further disposition pending results of labs.  HPI 45 y.o. female  presents for 3 month follow up with hypertension, hyperlipidemia, prediabetes and vitamin D.   Her blood pressure has been controlled at home, today their BP is  .   She does workout. She denies chest pain, shortness of breath, dizziness.  She reports that she has been having a lot of acid reflux.  She reports that she thinks that she aspriated and that is what caused her most recent PNA.  She did see pulmonology who did PFTs and felt that this was no asthma.    She does take advil regularly for her cycle.  She reports that she does not use goody powder or BC powder.     She is on cholesterol medication and denies myalgias. Her cholesterol is at goal. The cholesterol last visit was:   Lab Results  Component Value Date   CHOL 202 (H) 06/09/2015   HDL 74 06/09/2015   LDLCALC 91 06/09/2015   TRIG 186 (H) 06/09/2015   CHOLHDL 2.7 06/09/2015     She has been working on diet and exercise for prediabetes, and denies foot ulcerations, hyperglycemia, hypoglycemia , increased appetite, nausea, paresthesia of the feet, polydipsia, polyuria, visual disturbances, vomiting and weight loss. Last A1C in the office was:  Lab Results  Component Value Date   HGBA1C 5.5 06/09/2015    Patient is on Vitamin D supplement.  Lab Results  Component Value Date   VD25OH 34 06/09/2015      Current Medications:  Current Outpatient Prescriptions on File  Prior to Visit  Medication Sig Dispense Refill  . aspirin 81 MG tablet Take 81 mg by mouth daily.    Marland Kitchen buPROPion (WELLBUTRIN XL) 150 MG 24 hr tablet TAKE 1 TABLET BY MOUTH EVERY MORNING. 30 tablet 2  . Chlorphen-PE-Acetaminophen 4-10-325 MG TABS Take 1 tablet by mouth 3 (three) times daily as needed. 30 tablet 0  . Cholecalciferol (VITAMIN D) 2000 UNITS tablet Take 10,000 Units by mouth daily. Pt takes Finest Nutrition Bone Health    . Cyanocobalamin (VITAMIN B 12) 100 MCG LOZG Take by mouth.    . doxylamine, Sleep, (UNISOM) 25 MG tablet Take 25 mg by mouth at bedtime as needed.    Marland Kitchen ipratropium (ATROVENT) 0.03 % nasal spray Place 2 sprays into the nose 3 (three) times daily. 30 mL 2  . levofloxacin (LEVAQUIN) 750 MG tablet Take 1 tablet (750 mg total) by mouth daily. X 7 days 7 tablet 0  . loratadine (CLARITIN) 10 MG tablet Take 10 mg by mouth daily.    . montelukast (SINGULAIR) 10 MG tablet TAKE 1 TABLET (10 MG TOTAL) BY MOUTH DAILY. 90 tablet 1  . naproxen sodium (ANAPROX) 220 MG tablet Take 220 mg by mouth as needed.    Marland Kitchen OVER THE COUNTER MEDICATION Take 1 capsule by mouth at bedtime as needed. NatureMade Natural Sleep Aid. For sleep     . pravastatin (PRAVACHOL) 40 MG tablet TAKE 1 TABLET BY MOUTH EVERY DAY 90  tablet 1  . predniSONE (DELTASONE) 20 MG tablet 3 tabs po daily x 3 days, then 2 tabs x 3 days, then 1.5 tabs x 3 days, then 1 tab x 3 days, then 0.5 tabs x 3 days 27 tablet 0  . sertraline (ZOLOFT) 100 MG tablet TAKE 1 TABLET BY MOUTH DAILY 30 tablet 3  . traZODone (DESYREL) 100 MG tablet TAKE 1 TABLET (100 MG TOTAL) BY MOUTH AT BEDTIME. 90 tablet 1  . valsartan (DIOVAN) 80 MG tablet 1/2- 1 PILL DAILY FOR BLOOD PRESSURE 14 tablet 0  . fluticasone (FLONASE) 50 MCG/ACT nasal spray Place 2 sprays into both nostrils daily. 16 g 2   No current facility-administered medications on file prior to visit.     Medical History:  Past Medical History:  Diagnosis Date  . Anxiety   .  Depression    no meds  . Hyperlipidemia   . Hypertension    no meds    Allergies:  Allergies  Allergen Reactions  . Erythromycin Nausea Only and Other (See Comments)    Stomach cramping  . Flexeril [Cyclobenzaprine Hcl] Other (See Comments)    "don't like the way I feel"  . Metformin And Related     GI Upset     Review of Systems:  Review of Systems  Constitutional: Negative for chills, fever and malaise/fatigue.  HENT: Negative for congestion, ear pain and sore throat.   Eyes: Negative.   Respiratory: Positive for shortness of breath. Negative for cough and wheezing.   Cardiovascular: Negative for chest pain, palpitations and leg swelling.  Gastrointestinal: Positive for heartburn. Negative for abdominal pain, blood in stool, constipation, diarrhea and melena.  Genitourinary: Negative.   Skin: Negative.   Neurological: Negative for dizziness, sensory change, loss of consciousness and headaches.  Psychiatric/Behavioral: Negative for depression. The patient is not nervous/anxious and does not have insomnia.     Family history- Review and unchanged  Social history- Review and unchanged  Physical Exam: Wt 200 lb (90.7 kg)   LMP 08/17/2015   PF 51 L/min   BMI 37.79 kg/m  Wt Readings from Last 3 Encounters:  09/10/15 200 lb (90.7 kg)  08/31/15 205 lb (93 kg)  06/09/15 206 lb (93.4 kg)    General Appearance: Well nourished well developed, in no apparent distress. Eyes: PERRLA, EOMs, conjunctiva no swelling or erythema ENT/Mouth: Ear canals normal without obstruction, swelling, erythma, discharge.  TMs normal bilaterally.  Oropharynx moist, clear, without exudate, or postoropharyngeal swelling. Neck: Supple, thyroid normal,no cervical adenopathy  Respiratory: Respiratory effort normal, Breath sounds clear A&P without rhonchi, wheeze, or rale.  No retractions, no accessory usage. Cardio: RRR with no MRGs. Brisk peripheral pulses without edema.  Abdomen: Soft, + BS,  Non  tender, no guarding, rebound, hernias, masses. Musculoskeletal: Full ROM, 5/5 strength, Normal gait Skin: Warm, dry without rashes, lesions, ecchymosis.  Neuro: Awake and oriented X 3, Cranial nerves intact. Normal muscle tone, no cerebellar symptoms. Psych: Normal affect, Insight and Judgment appropriate.    Terri Piedraourtney Forcucci, PA-C 3:52 PM [facility]

## 2015-09-10 NOTE — Patient Instructions (Signed)
Food Choices for Gastroesophageal Reflux Disease, Adult When you have gastroesophageal reflux disease (GERD), the foods you eat and your eating habits are very important. Choosing the right foods can help ease the discomfort of GERD. WHAT GENERAL GUIDELINES DO I NEED TO FOLLOW?  Choose fruits, vegetables, whole grains, low-fat dairy products, and low-fat meat, fish, and poultry.  Limit fats such as oils, salad dressings, butter, nuts, and avocado.  Keep a food diary to identify foods that cause symptoms.  Avoid foods that cause reflux. These may be different for different people.  Eat frequent small meals instead of three large meals each day.  Eat your meals slowly, in a relaxed setting.  Limit fried foods.  Cook foods using methods other than frying.  Avoid drinking alcohol.  Avoid drinking large amounts of liquids with your meals.  Avoid bending over or lying down until 2-3 hours after eating. WHAT FOODS ARE NOT RECOMMENDED? The following are some foods and drinks that may worsen your symptoms: Vegetables Tomatoes. Tomato juice. Tomato and spaghetti sauce. Chili peppers. Onion and garlic. Horseradish. Fruits Oranges, grapefruit, and lemon (fruit and juice). Meats High-fat meats, fish, and poultry. This includes hot dogs, ribs, ham, sausage, salami, and bacon. Dairy Whole milk and chocolate milk. Sour cream. Cream. Butter. Ice cream. Cream cheese.  Beverages Coffee and tea, with or without caffeine. Carbonated beverages or energy drinks. Condiments Hot sauce. Barbecue sauce.  Sweets/Desserts Chocolate and cocoa. Donuts. Peppermint and spearmint. Fats and Oils High-fat foods, including French fries and potato chips. Other Vinegar. Strong spices, such as black pepper, white pepper, red pepper, cayenne, curry powder, cloves, ginger, and chili powder. The items listed above may not be a complete list of foods and beverages to avoid. Contact your dietitian for more  information.   This information is not intended to replace advice given to you by your health care provider. Make sure you discuss any questions you have with your health care provider.   Document Released: 01/17/2005 Document Revised: 02/07/2014 Document Reviewed: 11/21/2012 Elsevier Interactive Patient Education 2016 [employer]  

## 2015-09-29 ENCOUNTER — Other Ambulatory Visit: Payer: Self-pay | Admitting: Internal Medicine

## 2015-09-29 ENCOUNTER — Encounter: Payer: Self-pay | Admitting: Internal Medicine

## 2015-09-30 ENCOUNTER — Ambulatory Visit (INDEPENDENT_AMBULATORY_CARE_PROVIDER_SITE_OTHER): Payer: 59 | Admitting: Internal Medicine

## 2015-09-30 ENCOUNTER — Encounter: Payer: Self-pay | Admitting: Internal Medicine

## 2015-09-30 VITALS — BP 128/80 | HR 78 | Temp 98.2°F | Resp 18 | Ht 61.0 in | Wt 200.0 lb

## 2015-09-30 DIAGNOSIS — R300 Dysuria: Secondary | ICD-10-CM

## 2015-09-30 DIAGNOSIS — J189 Pneumonia, unspecified organism: Secondary | ICD-10-CM | POA: Diagnosis not present

## 2015-09-30 LAB — URINALYSIS, ROUTINE W REFLEX MICROSCOPIC
BILIRUBIN URINE: NEGATIVE
GLUCOSE, UA: NEGATIVE
Hgb urine dipstick: NEGATIVE
Ketones, ur: NEGATIVE
LEUKOCYTES UA: NEGATIVE
Nitrite: NEGATIVE
PH: 7 (ref 5.0–8.0)
PROTEIN: NEGATIVE
SPECIFIC GRAVITY, URINE: 1.014 (ref 1.001–1.035)

## 2015-09-30 MED ORDER — CIPROFLOXACIN HCL 500 MG PO TABS: 500.0000 mg | ORAL_TABLET | Freq: Two times a day (BID) | ORAL | 0 refills | Status: AC

## 2015-09-30 NOTE — Progress Notes (Signed)
Subjective:     Patient ID: Samantha Greene, female   DOB: 23-Sep-1970, 45 y.o.   MRN: [id number]  HPI    Patient presents to the office for evaluation of dysuria which has been going on for  Days.  She reports that she has not had any blood in her urine.  She has had some pressure in the lower abdomen.  She reports that she has had some frequency and urgency.  No vaginal discharge.  She has had no fevers, chills, nausea, or vomiting.  She has taken azo and also has been drinking more.   Review of Systems  Constitutional: Negative for chills and fever.  HENT: Negative for congestion, ear pain and sore throat.   Eyes: Negative.   Respiratory: Negative for cough, shortness of breath and wheezing.   Cardiovascular: Negative for chest pain, palpitations and leg swelling.  Gastrointestinal: Negative for abdominal pain, blood in stool, constipation and diarrhea.  Genitourinary: Positive for dysuria, frequency and urgency. Negative for hematuria, vaginal bleeding, vaginal discharge and vaginal pain.  Skin: Negative.   Neurological: Negative for dizziness and headaches.  Psychiatric/Behavioral: The patient is not nervous/anxious.        Objective:   Physical Exam  Constitutional: She is oriented to person, place, and time. She appears well-developed and well-nourished. No distress.  HENT:  Head: Normocephalic.  Mouth/Throat: Oropharynx is clear and moist. No oropharyngeal exudate.  Eyes: Conjunctivae are normal. No scleral icterus.  Neck: Normal range of motion. Neck supple. No JVD present. No thyromegaly present.  Cardiovascular: Normal rate, regular rhythm, normal heart sounds and intact distal pulses.  Exam reveals no gallop and no friction rub.   No murmur heard. Pulmonary/Chest: Effort normal and breath sounds normal. No respiratory distress. She has no wheezes. She has no rales. She exhibits no tenderness.  Abdominal: Soft. Normal appearance and bowel sounds are normal. She exhibits  no distension and no mass. There is tenderness in the suprapubic area. There is no rebound, no guarding and no CVA tenderness.  Musculoskeletal: Normal range of motion.  Lymphadenopathy:    She has no cervical adenopathy.  Neurological: She is alert and oriented to person, place, and time.  Skin: Skin is warm and dry. She is not diaphoretic.  Psychiatric: She has a normal mood and affect. Her behavior is normal. Judgment and thought content normal.  Nursing note and vitals reviewed.      Assessment:     Dysuria    Plan:     -cipro -ua -urine culture

## 2015-10-01 LAB — URINE CULTURE

## 2015-10-08 ENCOUNTER — Encounter: Payer: Self-pay | Admitting: Internal Medicine

## 2015-10-09 ENCOUNTER — Encounter: Payer: Self-pay | Admitting: Internal Medicine

## 2015-10-12 ENCOUNTER — Other Ambulatory Visit: Payer: Self-pay | Admitting: Internal Medicine

## 2015-10-12 MED ORDER — AZITHROMYCIN 250 MG PO TABS: ORAL_TABLET | ORAL | 0 refills | Status: DC

## 2015-10-15 ENCOUNTER — Encounter: Payer: Self-pay | Admitting: Internal Medicine

## 2015-10-22 ENCOUNTER — Encounter: Payer: Self-pay | Admitting: Internal Medicine

## 2015-10-27 ENCOUNTER — Encounter: Payer: Self-pay | Admitting: Internal Medicine

## 2015-10-29 ENCOUNTER — Ambulatory Visit (HOSPITAL_COMMUNITY)
Admission: RE | Admit: 2015-10-29 | Discharge: 2015-10-29 | Disposition: A | Discharge status: Home / Self Care | Payer: 59 | Source: Ambulatory Visit | Attending: Internal Medicine | Admitting: Internal Medicine

## 2015-10-29 ENCOUNTER — Encounter: Payer: Self-pay | Admitting: Physician Assistant

## 2015-10-29 ENCOUNTER — Ambulatory Visit (INDEPENDENT_AMBULATORY_CARE_PROVIDER_SITE_OTHER): Payer: 59 | Admitting: Physician Assistant

## 2015-10-29 VITALS — BP 146/98 | HR 80 | Temp 98.2°F | Resp 18 | Ht 61.0 in | Wt 200.0 lb

## 2015-10-29 DIAGNOSIS — J189 Pneumonia, unspecified organism: Secondary | ICD-10-CM | POA: Insufficient documentation

## 2015-10-29 DIAGNOSIS — J069 Acute upper respiratory infection, unspecified: Secondary | ICD-10-CM

## 2015-10-29 MED ORDER — ALBUTEROL SULFATE HFA 108 (90 BASE) MCG/ACT IN AERS
2.0000 | INHALATION_SPRAY | RESPIRATORY_TRACT | 0 refills | Status: DC | PRN
Start: 2015-10-29 — End: 2015-12-22

## 2015-10-29 MED ORDER — PREDNISONE 20 MG PO TABS: ORAL_TABLET | ORAL | 0 refills | Status: DC

## 2015-10-29 MED ORDER — IPRATROPIUM-ALBUTEROL 0.5-2.5 (3) MG/3ML IN SOLN
3.0000 mL | Freq: Once | RESPIRATORY_TRACT | Status: AC
  Administered 2015-10-29: 3 mL via RESPIRATORY_TRACT

## 2015-10-29 MED ORDER — LEVOFLOXACIN 500 MG PO TABS: 500.0000 mg | ORAL_TABLET | Freq: Every day | ORAL | 0 refills | Status: DC

## 2015-10-29 NOTE — Progress Notes (Signed)
Subjective:    Patient ID: Samantha Greene, female    DOB: July 22, 1970, 45 y.o.   MRN: [id number]  HPI 45 y.o. WF presents with UR symptoms, has history of recurrent sinusitis and is waiting on approval for sinus surgery with Dr. Ezzard Standing. Was given zpak that she completed with out help much, had a few days of feeling better than symptoms returned. EMS was called last night at work, she was coughing, wheezing, could not catch breath, had some anxiety and states couldn't talk so her work called EMS. She has cough with white mucus, wheezing, SOB, sinus pressure, drainage.   Blood pressure (!) 146/98, pulse 80, temperature 98.2 F (36.8 C), temperature source Temporal, resp. rate 18, height 5\' 1"  (1.549 m), weight 200 lb (90.7 kg), SpO2 92 %.  Medications Current Outpatient Prescriptions on File Prior to Visit  Medication Sig  . aspirin 81 MG tablet Take 81 mg by mouth daily.  Marland Kitchen buPROPion (WELLBUTRIN XL) 150 MG 24 hr tablet TAKE 1 TABLET BY MOUTH EVERY MORNING.  . Chlorphen-PE-Acetaminophen 4-10-325 MG TABS Take 1 tablet by mouth 3 (three) times daily as needed.  . Cholecalciferol (VITAMIN D) 2000 UNITS tablet Take 10,000 Units by mouth daily. Pt takes Finest Nutrition Bone Health  . Cyanocobalamin (VITAMIN B 12) 100 MCG LOZG Take by mouth.  Marland Kitchen ipratropium (ATROVENT) 0.03 % nasal spray Place 2 sprays into the nose 3 (three) times daily.  Marland Kitchen loratadine (CLARITIN) 10 MG tablet Take 10 mg by mouth daily.  . montelukast (SINGULAIR) 10 MG tablet TAKE 1 TABLET (10 MG TOTAL) BY MOUTH DAILY.  Marland Kitchen omeprazole (PRILOSEC) 40 MG capsule Take 1 capsule (40 mg total) by mouth daily.  Marland Kitchen OVER THE COUNTER MEDICATION Take 1 capsule by mouth at bedtime as needed. NatureMade Natural Sleep Aid. For sleep   . pravastatin (PRAVACHOL) 40 MG tablet TAKE 1 TABLET BY MOUTH EVERY DAY  . sertraline (ZOLOFT) 100 MG tablet TAKE 1 TABLET BY MOUTH DAILY  . traZODone (DESYREL) 100 MG tablet TAKE 1 TABLET (100 MG TOTAL) BY MOUTH  AT BEDTIME.  . valsartan (DIOVAN) 80 MG tablet 1/2- 1 PILL DAILY FOR BLOOD PRESSURE  . fluticasone (FLONASE) 50 MCG/ACT nasal spray Place 2 sprays into both nostrils daily.   No current facility-administered medications on file prior to visit.     Problem list She has Abnormal uterine bleeding; Insomnia; Depression; Hyperlipidemia; Hypertension; Anxiety; Congenital cavus deformity of foot; Ankle pain; Adiposity; Chest pain; Cough; and Dyspnea on her problem list.  Review of Systems  Constitutional: Positive for fatigue. Negative for chills, diaphoresis and fever.  HENT: Positive for congestion, postnasal drip, rhinorrhea, sinus pressure and sneezing. Negative for dental problem, ear discharge, ear pain, nosebleeds, sore throat, trouble swallowing and voice change.   Respiratory: Positive for cough, shortness of breath and wheezing. Negative for chest tightness.   Cardiovascular: Negative.   Gastrointestinal: Negative.   Genitourinary: Negative.   Musculoskeletal: Negative.  Negative for neck pain.  Neurological: Positive for headaches.       Objective:   Physical Exam  Constitutional: She is oriented to person, place, and time. She appears well-developed and well-nourished.  HENT:  Head: Normocephalic and atraumatic.  Right Ear: External ear normal.  Left Ear: External ear normal.  Nose: Nose normal.  Mouth/Throat: Oropharynx is clear and moist.  Eyes: Conjunctivae are normal. Pupils are equal, round, and reactive to light.  Neck: Normal range of motion. Neck supple.  Cardiovascular: Normal rate and regular rhythm.  Pulmonary/Chest: Effort normal. No respiratory distress. She has decreased breath sounds (diffuse). She has wheezes in the left lower field. She has no rales. She exhibits no tenderness.  Abdominal: Soft. Bowel sounds are normal.  Lymphadenopathy:    She has no cervical adenopathy.  Neurological: She is alert and oriented to person, place, and time.  Skin: Skin is  warm and dry.      Assessment & Plan:  1. URI (upper respiratory infection) Follow up Dr. Ezzard StandingNewman, get CXR.  - levofloxacin (LEVAQUIN) 500 MG tablet; Take 1 tablet (500 mg total) by mouth daily.  Dispense: 10 tablet; Refill: 0 - predniSONE (DELTASONE) 20 MG tablet; 2 tablets daily for 3 days, 1 tablet daily for 4 days.  Dispense: 10 tablet; Refill: 0 - albuterol (VENTOLIN HFA) 108 (90 Base) MCG/ACT inhaler; Inhale 2 puffs into the lungs every 4 (four) hours as needed for wheezing or shortness of breath.  Dispense: 1 Inhaler; Refill: 0 - ipratropium-albuterol (DUONEB) 0.5-2.5 (3) MG/3ML nebulizer solution 3 mL; Take 3 mLs by nebulization once.

## 2015-11-02 ENCOUNTER — Other Ambulatory Visit: Payer: Self-pay

## 2015-11-05 ENCOUNTER — Encounter: Payer: Self-pay | Admitting: Internal Medicine

## 2015-11-10 ENCOUNTER — Other Ambulatory Visit: Payer: Self-pay | Admitting: Physician Assistant

## 2015-11-11 ENCOUNTER — Other Ambulatory Visit: Payer: Self-pay | Admitting: Internal Medicine

## 2015-12-15 ENCOUNTER — Other Ambulatory Visit: Payer: Self-pay | Admitting: Physician Assistant

## 2015-12-22 ENCOUNTER — Other Ambulatory Visit: Payer: Self-pay | Admitting: Physician Assistant

## 2015-12-22 ENCOUNTER — Ambulatory Visit (INDEPENDENT_AMBULATORY_CARE_PROVIDER_SITE_OTHER): Payer: 59 | Admitting: Physician Assistant

## 2015-12-22 VITALS — BP 132/84 | HR 80 | Temp 97.3°F | Resp 16 | Ht 61.0 in | Wt 204.4 lb

## 2015-12-22 DIAGNOSIS — J069 Acute upper respiratory infection, unspecified: Secondary | ICD-10-CM

## 2015-12-22 MED ORDER — PROMETHAZINE-DM 6.25-15 MG/5ML PO SYRP: 5.0000 mL | ORAL_SOLUTION | Freq: Four times a day (QID) | ORAL | 1 refills | Status: DC | PRN

## 2015-12-22 MED ORDER — ALBUTEROL SULFATE HFA 108 (90 BASE) MCG/ACT IN AERS
2.0000 | INHALATION_SPRAY | RESPIRATORY_TRACT | 0 refills | Status: DC | PRN
Start: 2015-12-22 — End: 2016-06-22

## 2015-12-22 MED ORDER — LEVOFLOXACIN 500 MG PO TABS: 500.0000 mg | ORAL_TABLET | Freq: Every day | ORAL | 0 refills | Status: DC

## 2015-12-22 MED ORDER — PREDNISONE 20 MG PO TABS: ORAL_TABLET | ORAL | 0 refills | Status: DC

## 2015-12-22 NOTE — Progress Notes (Signed)
Subjective:    Patient ID: Samantha Greene, female    DOB: [birth year], 45 y.o.   MRN: [id number]  HPI 45 y.o. WF presents with sinus infection x 7 days. Has been taking norel. No fever or chills. Not getting any better. Has had sinus pain, congestion, sore throat that is improving, cough worse in AM and night with yellow mucus, no CP, SOB, wheezing.   Blood pressure 132/84, pulse 80, temperature 97.3 F (36.3 C), resp. rate 16, height 5\' 1"  (1.549 m), weight 204 lb 6.4 oz (92.7 kg).  Medications Current Outpatient Prescriptions on File Prior to Visit  Medication Sig  . albuterol (VENTOLIN HFA) 108 (90 Base) MCG/ACT inhaler Inhale 2 puffs into the lungs every 4 (four) hours as needed for wheezing or shortness of breath.  Marland Kitchen. aspirin 81 MG tablet Take 81 mg by mouth daily.  Marland Kitchen. buPROPion (WELLBUTRIN XL) 150 MG 24 hr tablet TAKE 1 TABLET BY MOUTH EVERY MORNING.  Marland Kitchen. ipratropium (ATROVENT) 0.03 % nasal spray Place 2 sprays into the nose 3 (three) times daily.  Marland Kitchen. loratadine (CLARITIN) 10 MG tablet Take 10 mg by mouth daily.  . montelukast (SINGULAIR) 10 MG tablet TAKE 1 TABLET (10 MG TOTAL) BY MOUTH DAILY.  Marland Kitchen. omeprazole (PRILOSEC) 40 MG capsule Take 1 capsule (40 mg total) by mouth daily.  Marland Kitchen. OVER THE COUNTER MEDICATION Take 1 capsule by mouth at bedtime as needed. NatureMade Natural Sleep Aid. For sleep   . pravastatin (PRAVACHOL) 40 MG tablet TAKE 1 TABLET BY MOUTH EVERY DAY  . sertraline (ZOLOFT) 100 MG tablet TAKE 1 TABLET BY MOUTH DAILY  . traZODone (DESYREL) 100 MG tablet TAKE 1 TABLET (100 MG TOTAL) BY MOUTH AT BEDTIME.  Marland Kitchen. Cholecalciferol (VITAMIN D) 2000 UNITS tablet Take 10,000 Units by mouth daily. Pt takes Finest Nutrition Bone Health  . Cyanocobalamin (VITAMIN B 12) 100 MCG LOZG Take by mouth.  . fluticasone (FLONASE) 50 MCG/ACT nasal spray Place 2 sprays into both nostrils daily.   No current facility-administered medications on file prior to visit.     Problem list She has  Abnormal uterine bleeding; Insomnia; Depression; Hyperlipidemia; Hypertension; Anxiety; Congenital cavus deformity of foot; Ankle pain; Adiposity; Chest pain; Cough; and Dyspnea on her problem list.   Review of Systems  Constitutional: Negative for chills and diaphoresis.  HENT: Positive for congestion, postnasal drip, sinus pressure and sneezing. Negative for ear pain and sore throat.   Respiratory: Positive for cough. Negative for chest tightness, shortness of breath and wheezing.   Cardiovascular: Negative.   Gastrointestinal: Negative.   Genitourinary: Negative.   Musculoskeletal: Negative for neck pain.  Neurological: Positive for headaches.       Objective:   Physical Exam  Constitutional: She is oriented to person, place, and time. She appears well-developed and well-nourished.  HENT:  Head: Normocephalic and atraumatic.  Right Ear: Hearing and external ear normal. No mastoid tenderness. Tympanic membrane is injected. Tympanic membrane is not perforated, not erythematous, not retracted and not bulging. A middle ear effusion is present.  Left Ear: Hearing and external ear normal. No mastoid tenderness. Tympanic membrane is injected. Tympanic membrane is not perforated, not erythematous, not retracted and not bulging. A middle ear effusion is present.  Nose: Right sinus exhibits maxillary sinus tenderness. Right sinus exhibits no frontal sinus tenderness. Left sinus exhibits maxillary sinus tenderness. Left sinus exhibits no frontal sinus tenderness.  Mouth/Throat: Uvula is midline, oropharynx is clear and moist and mucous membranes are normal.  Eyes:  Conjunctivae and EOM are normal. Pupils are equal, round, and reactive to light.  Neck: Normal range of motion. Neck supple.  Cardiovascular: Normal rate, regular rhythm, normal heart sounds and intact distal pulses.   Pulmonary/Chest: Effort normal and breath sounds normal. No respiratory distress. She has no wheezes.  Abdominal: Soft.  Bowel sounds are normal.  Musculoskeletal: Normal range of motion.  Lymphadenopathy:    She has no cervical adenopathy.  Neurological: She is alert and oriented to person, place, and time.  Skin: Skin is warm and dry.      Assessment & Plan:  1. Upper respiratory tract infection, unspecified type Will hold the levaquin and take if she is not getting better, increase fluids, rest, cont allergy pill - levofloxacin (LEVAQUIN) 500 MG tablet; Take 1 tablet (500 mg total) by mouth daily.  Dispense: 10 tablet; Refill: 0 - predniSONE (DELTASONE) 20 MG tablet; 2 tablets daily for 3 days, 1 tablet daily for 4 days.  Dispense: 10 tablet; Refill: 0 - promethazine-dextromethorphan (PROMETHAZINE-DM) 6.25-15 MG/5ML syrup; Take 5 mLs by mouth 4 (four) times daily as needed for cough.  Dispense: 240 mL; Refill: 1 - albuterol (VENTOLIN HFA) 108 (90 Base) MCG/ACT inhaler; Inhale 2 puffs into the lungs every 4 (four) hours as needed for wheezing or shortness of breath.  Dispense: 1 Inhaler; Refill: 0

## 2015-12-22 NOTE — Patient Instructions (Signed)
Please take the prednisone to help decrease inflammation and therefore decrease symptoms. Take it it with food to avoid GI upset. It can cause increased energy but on the other hand it can make it hard to sleep at night so please take it AT NIGHT WITH DINNER, it takes 8-12 hours to start working so it will NOT affect your sleeping if you take it at night with your food!!  If you are diabetic it will increase your sugars so decrease carbs and monitor your sugars closely.    HOW TO TREAT VIRAL COUGH AND COLD SYMPTOMS:  -Symptoms usually last at least 1 week with the worst symptoms being around day 4.  - colds usually start with a sore throat and end with a cough, and the cough can take 2 weeks to get better.  -No antibiotics are needed for colds, flu, sore throats, cough, bronchitis UNLESS symptoms are longer than 7 days OR if you are getting better then get drastically worse.  -There are a lot of combination medications (Dayquil, Nyquil, Vicks 44, tyelnol cold and sinus, ETC). Please look at the ingredients on the back so that you are treating the correct symptoms and not doubling up on medications/ingredients.    Medicines you can use  Nasal congestion  - pseudoephedrine (Sudafed)- behind the counter, do not use if you have high blood pressure, medicine that have -D in them.  - phenylephrine (Sudafed PE) -Dextormethorphan + chlorpheniramine (Coridcidin HBP)- okay if you have high blood pressure -Oxymetazoline (Afrin) nasal spray- LIMIT to 3 days -Saline nasal spray -Neti pot (used distilled or bottled water)  Ear pain/congestion  -pseudoephedrine (sudafed) - Nasonex/flonase nasal spray  Fever  -Acetaminophen (Tyelnol) -Ibuprofen (Advil, motrin, aleve)  Sore Throat  -Acetaminophen (Tyelnol) -Ibuprofen (Advil, motrin, aleve) -Drink a lot of water -Gargle with salt water - Rest your voice (don't talk) -Throat sprays -Cough drops  Body Aches  -Acetaminophen (Tyelnol) -Ibuprofen  (Advil, motrin, aleve)  Headache  -Acetaminophen (Tyelnol) -Ibuprofen (Advil, motrin, aleve) - Exedrin, Exedrin Migraine  Allergy symptoms (cough, sneeze, runny nose, itchy eyes) -Claritin or loratadine cheapest but likely the weakest  -Zyrtec or certizine at night because it can make you sleepy -The strongest is allegra or fexafinadine  Cheapest at [employer], [employer], [employer]  Cough  -Dextromethorphan (Delsym)- medicine that has DM in it -Guafenesin (Mucinex/Robitussin) - cough drops - drink lots of water  Chest Congestion  -Guafenesin (Mucinex/Robitussin)  Red Itchy Eyes  - Naphcon-A  Upset Stomach  - Bland diet (nothing spicy, greasy, fried, and high acid foods like tomatoes, oranges, berries) -OKAY- cereal, bread, soup, crackers, rice -Eat smaller more frequent meals -reduce caffeine, no alcohol -Loperamide (Imodium-AD) if diarrhea -Prevacid for heart burn  General health when sick  -Hydration -wash your hands frequently -keep surfaces clean -change pillow cases and sheets often -Get fresh air but do not exercise strenuously -Vitamin D, double up on it - Vitamin C -Zinc       

## 2016-01-10 ENCOUNTER — Other Ambulatory Visit: Payer: Self-pay | Admitting: Physician Assistant

## 2016-01-11 ENCOUNTER — Ambulatory Visit: Payer: Self-pay | Admitting: Internal Medicine

## 2016-01-28 ENCOUNTER — Encounter: Payer: Self-pay | Admitting: *Deleted

## 2016-02-05 ENCOUNTER — Other Ambulatory Visit: Payer: Self-pay | Admitting: Internal Medicine

## 2016-02-06 ENCOUNTER — Other Ambulatory Visit: Payer: Self-pay | Admitting: Internal Medicine

## 2016-02-11 ENCOUNTER — Encounter: Payer: Self-pay | Admitting: Internal Medicine

## 2016-02-11 ENCOUNTER — Ambulatory Visit (INDEPENDENT_AMBULATORY_CARE_PROVIDER_SITE_OTHER): Payer: 59 | Admitting: Internal Medicine

## 2016-02-11 VITALS — BP 134/88 | HR 88 | Temp 98.2°F | Resp 18 | Ht 61.0 in | Wt 202.0 lb

## 2016-02-11 DIAGNOSIS — F419 Anxiety disorder, unspecified: Secondary | ICD-10-CM

## 2016-02-11 DIAGNOSIS — Z79899 Other long term (current) drug therapy: Secondary | ICD-10-CM

## 2016-02-11 DIAGNOSIS — E66811 Obesity, class 1: Secondary | ICD-10-CM

## 2016-02-11 DIAGNOSIS — F988 Other specified behavioral and emotional disorders with onset usually occurring in childhood and adolescence: Secondary | ICD-10-CM

## 2016-02-11 DIAGNOSIS — E6609 Other obesity due to excess calories: Secondary | ICD-10-CM | POA: Diagnosis not present

## 2016-02-11 DIAGNOSIS — I10 Essential (primary) hypertension: Secondary | ICD-10-CM | POA: Diagnosis not present

## 2016-02-11 DIAGNOSIS — E782 Mixed hyperlipidemia: Secondary | ICD-10-CM

## 2016-02-11 DIAGNOSIS — J321 Chronic frontal sinusitis: Secondary | ICD-10-CM

## 2016-02-11 DIAGNOSIS — F411 Generalized anxiety disorder: Secondary | ICD-10-CM

## 2016-02-11 LAB — CBC WITH DIFFERENTIAL/PLATELET
Basophils Absolute: 0 {cells}/uL (ref 0–200)
Basophils Relative: 0 %
Eosinophils Absolute: 61 {cells}/uL (ref 15–500)
Eosinophils Relative: 1 %
HCT: 37.3 % (ref 35.0–45.0)
Hemoglobin: 12.2 g/dL (ref 11.7–15.5)
Lymphocytes Relative: 39 %
Lymphs Abs: 2379 {cells}/uL (ref 850–3900)
MCH: 30.2 pg (ref 27.0–33.0)
MCHC: 32.7 g/dL (ref 32.0–36.0)
MCV: 92.3 fL (ref 80.0–100.0)
MPV: 8.7 fL (ref 7.5–12.5)
Monocytes Absolute: 488 {cells}/uL (ref 200–950)
Monocytes Relative: 8 %
Neutro Abs: 3172 {cells}/uL (ref 1500–7800)
Neutrophils Relative %: 52 %
Platelets: 308 K/uL (ref 140–400)
RBC: 4.04 MIL/uL (ref 3.80–5.10)
RDW: 14.1 % (ref 11.0–15.0)
WBC: 6.1 K/uL (ref 3.8–10.8)

## 2016-02-11 LAB — TSH: TSH: 5.11 mIU/L — ABNORMAL HIGH

## 2016-02-11 MED ORDER — AMPHETAMINE-DEXTROAMPHETAMINE 10 MG PO TABS: 10.0000 mg | ORAL_TABLET | Freq: Every day | ORAL | 0 refills | Status: DC

## 2016-02-11 NOTE — Progress Notes (Signed)
Assessment and Plan:  Hypertension:  -Continue medication -monitor blood pressure at home. -Continue DASH diet -Reminder to go to the ER if any CP, SOB, nausea, dizziness, severe HA, changes vision/speech, left arm numbness and tingling and jaw pain.  Cholesterol - Continue diet and exercise -Check cholesterol.   PreDiabetes without complications -Continue diet and exercise.  -Check A1C  Vitamin D Def -check level -continue medications.   Generalized anxiety -cont wellbutrin and zoloft -consider increasing the dose of the wellbutrin in 1 month if no improvement  -EKG but anxiety is the likely source of the palpitations  ADD -try adderall -recheck in a month  Chronic sinusitis -needs surgery for sinusitis  Continue diet and meds as discussed. Further disposition pending results of labs. Discussed med's effects and SE's.    HPI 46 y.o. female  presents for 3 month follow up with hypertension, hyperlipidemia, diabetes and vitamin D deficiency.   Her blood pressure has been controlled at home, today their BP is BP: 134/88.She does not workout. She denies chest pain, shortness of breath, dizziness.   She is on cholesterol medication and denies myalgias. Her cholesterol is at goal. The cholesterol was:  06/09/2015: Cholesterol 202; HDL 74; LDL Cholesterol 91; Triglycerides 186   She has been working on diet and exercise for diabetes without complications, she is on bASA, she is on ACE/ARB, and denies  foot ulcerations, hyperglycemia, hypoglycemia , increased appetite, nausea, paresthesia of the feet, polydipsia, polyuria, visual disturbances, vomiting and weight loss. Last A1C was: 06/09/2015: Hgb A1c MFr Bld 5.5.   Patient is on Vitamin D supplement. 06/09/2015: Vit D, 25-Hydroxy 34  Patient reports that she is currently working at a [employer]ists office and she is really enjoying her new job.  She reports that she is a little stressed out.  She reports that she was unable to get the  sinus surgery as she was transitioning to a new job.    She reports that she has been having some trouble with palpitations.  She reports that she feels like she has been a little bit more anxious lately.  She reports that it made her chest hurt a little bit.  She was mildly short of breath.  She did have some mild dizziness but she is not sure whether this was related to her chronic sinus issues.    She also notes that she is having a hard time with paying attention and staying on task at her job.  She notes that she can't even listen to people sometimes when they are talking directly to her.    Current Medications:  Current Outpatient Prescriptions on File Prior to Visit  Medication Sig Dispense Refill  . albuterol (VENTOLIN HFA) 108 (90 Base) MCG/ACT inhaler Inhale 2 puffs into the lungs every 4 (four) hours as needed for wheezing or shortness of breath. 1 Inhaler 0  . aspirin 81 MG tablet Take 81 mg by mouth daily.    Marland Kitchen buPROPion (WELLBUTRIN XL) 150 MG 24 hr tablet TAKE 1 TABLET BY MOUTH EVERY MORNING. 30 tablet 0  . Cholecalciferol (VITAMIN D) 2000 UNITS tablet Take 10,000 Units by mouth daily. Pt takes Finest Nutrition Bone Health    . Cyanocobalamin (VITAMIN B 12) 100 MCG LOZG Take by mouth.    Marland Kitchen ipratropium (ATROVENT) 0.03 % nasal spray Place 2 sprays into the nose 3 (three) times daily. 30 mL 2  . loratadine (CLARITIN) 10 MG tablet Take 10 mg by mouth daily.    . montelukast (  SINGULAIR) 10 MG tablet TAKE 1 TABLET (10 MG TOTAL) BY MOUTH DAILY. 90 tablet 1  . omeprazole (PRILOSEC) 40 MG capsule Take 1 capsule (40 mg total) by mouth daily. 90 capsule 1  . OVER THE COUNTER MEDICATION Patient takes 1 gummy multivitamin daily.    . pravastatin (PRAVACHOL) 40 MG tablet TAKE 1 TABLET BY MOUTH EVERY DAY 90 tablet 0  . sertraline (ZOLOFT) 100 MG tablet TAKE 1 TABLET BY MOUTH DAILY 30 tablet 3  . traZODone (DESYREL) 100 MG tablet TAKE 1 TABLET (100 MG TOTAL) BY MOUTH AT BEDTIME. 90 tablet 1  .  valsartan (DIOVAN) 80 MG tablet TAKE 1/2 TO 1 TABLET BY MOUTH DAILY FOR BLOOD PRESSURE 30 tablet 2  . fluticasone (FLONASE) 50 MCG/ACT nasal spray Place 2 sprays into both nostrils daily. 16 g 2   No current facility-administered medications on file prior to visit.    Medical History:  Past Medical History:  Diagnosis Date  . Anxiety   . Depression    no meds  . Hyperlipidemia   . Hypertension    no meds   Allergies:  Allergies  Allergen Reactions  . Erythromycin Nausea Only and Other (See Comments)    Stomach cramping  . Flexeril [Cyclobenzaprine Hcl] Other (See Comments)    "don't like the way I feel"  . Metformin And Related     GI Upset     Review of Systems:  Review of Systems  Constitutional: Negative for chills, fever and malaise/fatigue.  HENT: Negative for congestion, ear pain and sore throat.   Eyes: Negative.   Respiratory: Negative for cough, shortness of breath and wheezing.   Cardiovascular: Negative for chest pain, palpitations and leg swelling.  Gastrointestinal: Negative for abdominal pain, blood in stool, constipation, diarrhea, heartburn and melena.  Genitourinary: Negative.   Skin: Negative.   Neurological: Negative for dizziness, sensory change, loss of consciousness and headaches.  Psychiatric/Behavioral: Negative for depression. The patient is not nervous/anxious and does not have insomnia.     Family history- Review and unchanged  Social history- Review and unchanged  Physical Exam: BP 134/88   Pulse 88   Temp 98.2 F (36.8 C) (Temporal)   Resp 18   Ht 5\' 1"  (1.549 m)   Wt 202 lb (91.6 kg)   BMI 38.17 kg/m  Wt Readings from Last 3 Encounters:  02/11/16 202 lb (91.6 kg)  12/22/15 204 lb 6.4 oz (92.7 kg)  10/29/15 200 lb (90.7 kg)   General Appearance: Well nourished well developed, non-toxic appearing, in no apparent distress. Eyes: PERRLA, EOMs, conjunctiva no swelling or erythema ENT/Mouth: Ear canals clear with no erythema,  swelling, or discharge.  TMs normal bilaterally, oropharynx clear, moist, with no exudate.   Neck: Supple, thyroid normal, no JVD, no cervical adenopathy.  Respiratory: Respiratory effort normal, breath sounds clear A&P, no wheeze, rhonchi or rales noted.  No retractions, no accessory muscle usage Cardio: RRR with no MRGs. No noted edema.  Abdomen: Soft, + BS.  Non tender, no guarding, rebound, hernias, masses. Musculoskeletal: Full ROM, 5/5 strength, Normal gait Skin: Warm, dry without rashes, lesions, ecchymosis.  Neuro: Awake and oriented X 3, Cranial nerves intact. No cerebellar symptoms.  Psych: normal affect, Insight and Judgment appropriate.    Terri Piedraourtney Forcucci, PA-C 3:34 PM [facility]

## 2016-02-12 ENCOUNTER — Other Ambulatory Visit: Payer: Self-pay | Admitting: Internal Medicine

## 2016-02-12 ENCOUNTER — Encounter: Payer: Self-pay | Admitting: Internal Medicine

## 2016-02-12 LAB — HEPATIC FUNCTION PANEL
ALBUMIN: 4 g/dL (ref 3.6–5.1)
ALT: 9 U/L (ref 6–29)
AST: 14 U/L (ref 10–35)
Alkaline Phosphatase: 47 U/L (ref 33–115)
BILIRUBIN DIRECT: 0.1 mg/dL (ref <=0.2)
BILIRUBIN TOTAL: 0.4 mg/dL (ref 0.2–1.2)
Indirect Bilirubin: 0.3 mg/dL (ref 0.2–1.2)
Total Protein: 7 g/dL (ref 6.1–8.1)

## 2016-02-12 LAB — LIPID PANEL
CHOL/HDL RATIO: 2.5 ratio (ref <5.0)
CHOLESTEROL: 218 mg/dL — AB (ref <200)
HDL: 88 mg/dL (ref >50)
LDL Cholesterol: 97 mg/dL (ref <100)
Triglycerides: 163 mg/dL — ABNORMAL HIGH (ref <150)
VLDL: 33 mg/dL — AB (ref <30)

## 2016-02-12 LAB — BASIC METABOLIC PANEL WITH GFR
BUN: 11 mg/dL (ref 7–25)
CHLORIDE: 101 mmol/L (ref 98–110)
CO2: 25 mmol/L (ref 20–31)
CREATININE: 0.89 mg/dL (ref 0.50–1.10)
Calcium: 9.3 mg/dL (ref 8.6–10.2)
GFR, Est Non African American: 78 mL/min (ref >=60)
Glucose, Bld: 73 mg/dL (ref 65–99)
Potassium: 4.1 mmol/L (ref 3.5–5.3)
SODIUM: 138 mmol/L (ref 135–146)

## 2016-02-12 LAB — HEMOGLOBIN A1C
Hgb A1c MFr Bld: 5.4 % (ref <5.7)
MEAN PLASMA GLUCOSE: 108 mg/dL

## 2016-03-07 NOTE — Addendum Note (Signed)
Addended by: Linnie Mcglocklin A on: 03/07/2016 12:12 PM   Modules accepted: Orders

## 2016-03-08 ENCOUNTER — Other Ambulatory Visit: Payer: Self-pay | Admitting: Internal Medicine

## 2016-03-11 ENCOUNTER — Other Ambulatory Visit: Payer: Self-pay | Admitting: Internal Medicine

## 2016-03-17 ENCOUNTER — Encounter: Payer: Self-pay | Admitting: Internal Medicine

## 2016-03-17 ENCOUNTER — Other Ambulatory Visit: Payer: Self-pay

## 2016-03-17 ENCOUNTER — Ambulatory Visit (INDEPENDENT_AMBULATORY_CARE_PROVIDER_SITE_OTHER): Payer: 59 | Admitting: Internal Medicine

## 2016-03-17 VITALS — BP 128/86 | HR 90 | Temp 98.2°F | Ht 61.0 in | Wt 202.0 lb

## 2016-03-17 DIAGNOSIS — F988 Other specified behavioral and emotional disorders with onset usually occurring in childhood and adolescence: Secondary | ICD-10-CM

## 2016-03-17 DIAGNOSIS — F419 Anxiety disorder, unspecified: Secondary | ICD-10-CM

## 2016-03-17 MED ORDER — BUPROPION HCL ER (XL) 150 MG PO TB24: 150.0000 mg | ORAL_TABLET | Freq: Every morning | ORAL | 0 refills | Status: DC

## 2016-03-17 MED ORDER — AMPHETAMINE-DEXTROAMPHETAMINE 10 MG PO TABS: 10.0000 mg | ORAL_TABLET | Freq: Every day | ORAL | 0 refills | Status: DC

## 2016-03-17 NOTE — Progress Notes (Signed)
Assessment and Plan:    1. Attention deficit disorder (ADD) without hyperactivity -cont Adderall -take drug holidays -[place] drug database reviewed -need to see patient three times per year to continue prescribing   2. Anxiety -cont zoloft       HPI 46 y.o.female presents for 1 month follow up of new diagnosis of ADD with adderall  Use.  She reports that she is doing much better on the adderall.  She only takes it on days that she needs it.  She reports that she didn't have any side effects.  She reports that she did get fired from her job and is looking for a new one.  She would like to stay on adderall.  She does take weekends and days off.    Past Medical History:  Diagnosis Date  . Anxiety   . Depression    no meds  . Hyperlipidemia   . Hypertension    no meds     Allergies  Allergen Reactions  . Erythromycin Nausea Only and Other (See Comments)    Stomach cramping  . Flexeril [Cyclobenzaprine Hcl] Other (See Comments)    "don't like the way I feel"  . Metformin And Related     GI Upset      Current Outpatient Prescriptions on File Prior to Visit  Medication Sig Dispense Refill  . albuterol (VENTOLIN HFA) 108 (90 Base) MCG/ACT inhaler Inhale 2 puffs into the lungs every 4 (four) hours as needed for wheezing or shortness of breath. 1 Inhaler 0  . amphetamine-dextroamphetamine (ADDERALL) 10 MG tablet Take 1 tablet (10 mg total) by mouth daily with breakfast. 30 tablet 0  . aspirin 81 MG tablet Take 81 mg by mouth daily.    . Cholecalciferol (VITAMIN D) 2000 UNITS tablet Take 10,000 Units by mouth daily. Pt takes Finest Nutrition Bone Health    . Cyanocobalamin (VITAMIN B 12) 100 MCG LOZG Take by mouth.    Marland Kitchen ipratropium (ATROVENT) 0.03 % nasal spray Place 2 sprays into the nose 3 (three) times daily. 30 mL 2  . loratadine (CLARITIN) 10 MG tablet Take 10 mg by mouth daily.    . montelukast (SINGULAIR) 10 MG tablet TAKE 1 TABLET BY MOUTH EVERY DAY 90 tablet 0  .  omeprazole (PRILOSEC) 40 MG capsule TAKE 1 CAPSULE (40 MG TOTAL) BY MOUTH DAILY. 90 capsule 1  . OVER THE COUNTER MEDICATION Patient takes 1 gummy multivitamin daily.    . pravastatin (PRAVACHOL) 40 MG tablet TAKE 1 TABLET BY MOUTH EVERY DAY 90 tablet 0  . sertraline (ZOLOFT) 100 MG tablet TAKE 1 TABLET BY MOUTH DAILY 30 tablet 3  . traZODone (DESYREL) 100 MG tablet TAKE 1 TABLET (100 MG TOTAL) BY MOUTH AT BEDTIME. 90 tablet 1  . valsartan (DIOVAN) 80 MG tablet TAKE 1/2 TO 1 TABLET BY MOUTH DAILY FOR BLOOD PRESSURE 30 tablet 2  . fluticasone (FLONASE) 50 MCG/ACT nasal spray Place 2 sprays into both nostrils daily. 16 g 2   No current facility-administered medications on file prior to visit.     ROS: all negative except above.   Physical Exam: Filed Weights   03/17/16 1429  Weight: 202 lb (91.6 kg)   BP 128/86   Pulse 90   Temp 98.2 F (36.8 C) (Temporal)   Ht 5\' 1"  (1.549 m)   Wt 202 lb (91.6 kg)   BMI 38.17 kg/m  General Appearance: Well developed well nourished, non-toxic appearing in no apparent distress. Eyes: PERRLA, EOMs, conjunctiva  w/ no swelling or erythema or discharge Sinuses: No Frontal/maxillary tenderness ENT/Mouth: Ear canals clear without swelling or erythema.  TM's normal bilaterally with no retractions, bulging, or loss of landmarks.   Neck: Supple, thyroid normal, no notable JVD  Respiratory: Respiratory effort normal, Clear breath sounds anteriorly and posteriorly bilaterally without rales, rhonchi, wheezing or stridor. No retractions or accessory muscle usage. Cardio: RRR with no MRGs.   Abdomen: Soft, + BS.  Non tender, no guarding, rebound, hernias, masses.  Musculoskeletal: Full ROM, 5/5 strength, normal gait.  Skin: Warm, dry without rashes  Neuro: Awake and oriented X 3, Cranial nerves intact. Normal muscle tone, no cerebellar symptoms. Sensation intact.  Psych: normal affect, Insight and Judgment appropriate.     Terri Piedraourtney Forcucci, PA-C 2:50  PM [facility]

## 2016-03-27 ENCOUNTER — Other Ambulatory Visit: Payer: Self-pay | Admitting: Internal Medicine

## 2016-04-08 ENCOUNTER — Other Ambulatory Visit: Payer: Self-pay | Admitting: *Deleted

## 2016-04-08 MED ORDER — SERTRALINE HCL 100 MG PO TABS: 100.0000 mg | ORAL_TABLET | Freq: Every day | ORAL | 1 refills | Status: DC

## 2016-05-02 ENCOUNTER — Other Ambulatory Visit: Payer: Self-pay | Admitting: Internal Medicine

## 2016-05-11 ENCOUNTER — Other Ambulatory Visit: Payer: Self-pay | Admitting: Physician Assistant

## 2016-05-13 ENCOUNTER — Ambulatory Visit (INDEPENDENT_AMBULATORY_CARE_PROVIDER_SITE_OTHER): Payer: 59 | Admitting: Internal Medicine

## 2016-05-13 VITALS — BP 126/82 | HR 78 | Temp 98.2°F | Resp 16 | Ht 61.0 in

## 2016-05-13 DIAGNOSIS — G5603 Carpal tunnel syndrome, bilateral upper limbs: Secondary | ICD-10-CM | POA: Diagnosis not present

## 2016-05-13 DIAGNOSIS — M5441 Lumbago with sciatica, right side: Secondary | ICD-10-CM

## 2016-05-13 MED ORDER — MELOXICAM 15 MG PO TABS: 15.0000 mg | ORAL_TABLET | Freq: Every day | ORAL | 0 refills | Status: DC

## 2016-05-13 MED ORDER — BACLOFEN 10 MG PO TABS: 10.0000 mg | ORAL_TABLET | Freq: Two times a day (BID) | ORAL | 1 refills | Status: DC

## 2016-05-13 MED ORDER — PREDNISONE 20 MG PO TABS: ORAL_TABLET | ORAL | 0 refills | Status: DC

## 2016-05-13 NOTE — Progress Notes (Signed)
Assessment and Plan:   1. Acute midline low back pain with right-sided sciatica -no red flags in history of PE.  Neuro exam without deficits. Likely sciatica caused by muscular spasm vs. Possible bulging disc.  Will try conservative therapy.  If no relief for back can send to ortho for MRI and possible epidural injection.  Patient in agreement with plan.   -cont heat - baclofen (LIORESAL) 10 MG tablet; Take 1 tablet (10 mg total) by mouth 2 (two) times daily. Take 1/2 to 1 tablet 2 x day if needed for muscle spasm  Dispense: 60 tablet; Refill: 1 - predniSONE (DELTASONE) 20 MG tablet; 3 tabs po daily x 3 days, then 2 tabs x 3 days, then 1.5 tabs x 3 days, then 1 tab x 3 days, then 0.5 tabs x 3 days  Dispense: 27 tablet; Refill: 0 - meloxicam (MOBIC) 15 MG tablet; Take 1 tablet (15 mg total) by mouth daily.  Dispense: 90 tablet; Refill: 0  2.  Carpal Tunnel Syndrome. -referral to Dr. Amanda Pea   HPI 46 y.o.female presents for 5 weeks of increasing low back pain which has been bothering her since lifting heavy furniture.  The pain is in her low back.  The pain is intermittent, aching and grabbing pain.  She does have some numbness that radiates down to the legs.  She reports that the pain is worse with twisting.  She reports that she has relief with sitting and resting.  She reports that she does get stiff sometimes.  She reports that standing long periods bothers it as well.  She does not get woken up due to it that she is aware.  She has been using heat, advil 600-800 mg 2 times daily, and stretching.  She has had no changes in bowel or bladder habits.  She has never had problems with low back pain in the past.  She has never had back surgeries before.  No IV drug use.  She reports that she has not had cancer.  No osteoporosis or osteopenia.    She reports that she is still having numbness in her hands.  She is wearing her night splints for carpal tunnel.  She would like to see a hand specialist.     Past Medical History:  Diagnosis Date  . Anxiety   . Depression    no meds  . Hyperlipidemia   . Hypertension    no meds     Allergies  Allergen Reactions  . Erythromycin Nausea Only and Other (See Comments)    Stomach cramping  . Flexeril [Cyclobenzaprine Hcl] Other (See Comments)    "don't like the way I feel"  . Metformin And Related     GI Upset      Current Outpatient Prescriptions on File Prior to Visit  Medication Sig Dispense Refill  . albuterol (VENTOLIN HFA) 108 (90 Base) MCG/ACT inhaler Inhale 2 puffs into the lungs every 4 (four) hours as needed for wheezing or shortness of breath. 1 Inhaler 0  . amphetamine-dextroamphetamine (ADDERALL) 10 MG tablet Take 1 tablet (10 mg total) by mouth daily with breakfast. 30 tablet 0  . aspirin 81 MG tablet Take 81 mg by mouth daily.    Marland Kitchen buPROPion (WELLBUTRIN XL) 150 MG 24 hr tablet Take 1 tablet (150 mg total) by mouth every morning. 90 tablet 0  . Cholecalciferol (VITAMIN D) 2000 UNITS tablet Take 10,000 Units by mouth daily. Pt takes Finest Nutrition Bone Health    . Cyanocobalamin (VITAMIN  B 12) 100 MCG LOZG Take by mouth.    . fluticasone (FLONASE) 50 MCG/ACT nasal spray Place 2 sprays into both nostrils daily. 16 g 2  . ipratropium (ATROVENT) 0.03 % nasal spray PLACE 2 SPRAYS INTO THE NOSE 3 (THREE) TIMES DAILY. 90 mL 1  . loratadine (CLARITIN) 10 MG tablet Take 10 mg by mouth daily.    . montelukast (SINGULAIR) 10 MG tablet TAKE 1 TABLET BY MOUTH EVERY DAY 90 tablet 3  . omeprazole (PRILOSEC) 40 MG capsule TAKE 1 CAPSULE (40 MG TOTAL) BY MOUTH DAILY. 90 capsule 1  . OVER THE COUNTER MEDICATION Patient takes 1 gummy multivitamin daily.    . pravastatin (PRAVACHOL) 40 MG tablet TAKE 1 TABLET BY MOUTH EVERY DAY 90 tablet 0  . sertraline (ZOLOFT) 100 MG tablet Take 1 tablet (100 mg total) by mouth daily. 90 tablet 1  . traZODone (DESYREL) 100 MG tablet TAKE 1 TABLET (100 MG TOTAL) BY MOUTH AT BEDTIME. 90 tablet 1  .  valsartan (DIOVAN) 80 MG tablet TAKE 1/2 TO 1 TABLET BY MOUTH DAILY FOR BLOOD PRESSURE 30 tablet 2   No current facility-administered medications on file prior to visit.     ROS: all negative except above.   Physical Exam: There were no vitals filed for this visit. BP 126/82   Pulse 78   Temp 98.2 F (36.8 C) (Temporal)   Resp 16   Ht  (1.549 m)  General Appearance: Well developed well nourished, non-toxic appearing in no apparent distress. Eyes: PERRLA, EOMs, conjunctiva w/ no swelling or erythema or discharge Sinuses: No Frontal/maxillary tenderness ENT/Mouth: Ear canals clear without swelling or erythema.  TM's normal bilaterally with no retractions, bulging, or loss of landmarks.   Neck: Supple, thyroid normal, no notable JVD  Respiratory: Respiratory effort normal, Clear breath sounds anteriorly and posteriorly bilaterally without rales, rhonchi, wheezing or stridor. No retractions or accessory muscle usage. Cardio: RRR with no MRGs.   Abdomen: Soft, + BS.  Non tender, no guarding, rebound, hernias, masses.  Musculoskeletal: Patient rises slowly from sitting to standing.  They walk without an antalgic gait.  There is no evidence of erythema, ecchymosis, or gross deformity.  There is no tenderness to palpation over muscular or bony spine.  Active ROM is full but painful with bending to the left and twisting to the right.  Sensation to light touch is intact over all extremities.  Strength is symmetric and equal in all extremities. Full ROM, 5/5 strength, normal gait.  Skin: Warm, dry without rashes  Neuro: Awake and oriented X 3, Cranial nerves intact. Normal muscle tone, no cerebellar symptoms. Sensation intact.  Psych: normal affect, Insight and Judgment appropriate.     Terri Piedra, PA-C 9:43 AM [facility]

## 2016-05-13 NOTE — Patient Instructions (Signed)
Please take the prednisone taper with breakfast until gone.  Please take with food.  Once you finish the prednisone taper start taking meloxicam once daily with food.  Drink plenty of water with it.  You may take tylenol with it.  Do not take advil, aleve, naproxen, motrin, or ibuprofen with it.    Please take baclofen as needed for pain.  Do not drive within 4 hours of taking if you take a full tablet.     Sciatica Sciatica is pain, numbness, weakness, or tingling along your sciatic nerve. The sciatic nerve starts in the lower back and goes down the back of each leg. Sciatica happens when this nerve is pinched or has pressure put on it. Sciatica usually goes away on its own or with treatment. Sometimes, sciatica may keep coming back (recur). Follow these instructions at home: Medicines   Take over-the-counter and prescription medicines only as told by your doctor.  Do not drive or use heavy machinery while taking prescription pain medicine. Managing pain   If directed, put ice on the affected area.  Put ice in a plastic bag.  Place a towel between your skin and the bag.  Leave the ice on for 20 minutes, 2-3 times a day.  After icing, apply heat to the affected area before you exercise or as often as told by your doctor. Use the heat source that your doctor tells you to use, such as a moist heat pack or a heating pad.  Place a towel between your skin and the heat source.  Leave the heat on for 20-30 minutes.  Remove the heat if your skin turns bright red. This is especially important if you are unable to feel pain, heat, or cold. You may have a greater risk of getting burned. Activity   Return to your normal activities as told by your doctor. Ask your doctor what activities are safe for you.  Avoid activities that make your sciatica worse.  Take short rests during the day. Rest in a lying or standing position. This is usually better than sitting to rest.  When you rest for a  long time, do some physical activity or stretching between periods of rest.  Avoid sitting for a long time without moving. Get up and move around at least one time each hour.  Exercise and stretch regularly, as told by your doctor.  Do not lift anything that is heavier than 10 lb (4.5 kg) while you have symptoms of sciatica.  Avoid lifting heavy things even when you do not have symptoms.  Avoid lifting heavy things over and over.  When you lift objects, always lift in a way that is safe for your body. To do this, you should:  Bend your knees.  Keep the object close to your body.  Avoid twisting. General instructions   Use good posture.  Avoid leaning forward when you are sitting.  Avoid hunching over when you are standing.  Stay at a healthy weight.  Wear comfortable shoes that support your feet. Avoid wearing high heels.  Avoid sleeping on a mattress that is too soft or too hard. You might have less pain if you sleep on a mattress that is firm enough to support your back.  Keep all follow-up visits as told by your doctor. This is important. Contact a doctor if:  You have pain that:  Wakes you up when you are sleeping.  Gets worse when you lie down.  Is worse than the pain you  have had in the past.  Lasts longer than 4 weeks.  You lose weight for without trying. Get help right away if:  You cannot control when you pee (urinate) or poop (have a bowel movement).  You have weakness in any of these areas and it gets worse.  Lower back.  Lower belly (pelvis).  Butt (buttocks).  Legs.  You have redness or swelling of your back.  You have a burning feeling when you pee. This information is not intended to replace advice given to you by your health care provider. Make sure you discuss any questions you have with your health care provider. Document Released: 10/27/2007 Document Revised: 06/25/2015 Document Reviewed: 09/26/2014 Elsevier Interactive Patient  Education  2017 [employer].

## 2016-05-16 ENCOUNTER — Other Ambulatory Visit: Payer: Self-pay | Admitting: *Deleted

## 2016-05-16 MED ORDER — VALSARTAN 80 MG PO TABS: ORAL_TABLET | ORAL | 2 refills | Status: DC

## 2016-05-17 ENCOUNTER — Other Ambulatory Visit: Payer: Self-pay | Admitting: *Deleted

## 2016-05-17 MED ORDER — VALSARTAN 80 MG PO TABS: ORAL_TABLET | ORAL | 0 refills | Status: DC

## 2016-05-19 ENCOUNTER — Encounter: Payer: Self-pay | Admitting: Internal Medicine

## 2016-06-10 ENCOUNTER — Ambulatory Visit (INDEPENDENT_AMBULATORY_CARE_PROVIDER_SITE_OTHER): Payer: 59 | Admitting: Internal Medicine

## 2016-06-10 ENCOUNTER — Encounter: Payer: Self-pay | Admitting: Internal Medicine

## 2016-06-10 VITALS — BP 162/98 | HR 76 | Temp 97.3°F | Resp 16 | Ht 61.0 in | Wt 206.0 lb

## 2016-06-10 DIAGNOSIS — J0140 Acute pansinusitis, unspecified: Secondary | ICD-10-CM

## 2016-06-10 DIAGNOSIS — J042 Acute laryngotracheitis: Secondary | ICD-10-CM | POA: Diagnosis not present

## 2016-06-10 MED ORDER — AZITHROMYCIN 250 MG PO TABS: ORAL_TABLET | ORAL | 1 refills | Status: DC

## 2016-06-10 MED ORDER — PREDNISONE 20 MG PO TABS: ORAL_TABLET | ORAL | 0 refills | Status: DC

## 2016-06-10 NOTE — Progress Notes (Signed)
Subjective:    Patient ID: Samantha Greene, female    DOB: [birth year], 46 y.o.   MRN: [id number]  HPI  This very nice 46 yo MWF with HTN, HLD, PreDM presents with 10-14 day prodrome of head/chest congestion, sinus pressure, productive putrid yellow green sputum. Was seen 4-5 days ago at an Urgent Care and advised to take Norel. Patent present now as she feels she is not getting any better. Denies fever chills, dyspnea or rash.   Medication Sig  . albuterol (VENTOLIN HFA) 108 (90 Base) MCG/ACT inhaler Inhale 2 puffs into the lungs every 4 (four) hours as needed for wheezing or shortness of breath.  . amphetamine-dextroamphetamine (ADDERALL) 10 MG tablet Take 1 tablet (10 mg total) by mouth daily with breakfast.  . aspirin 81 MG tablet Take daily.  . baclofen  10 MG tablet Take 1/2 to 1 tab 2 x day if needed  . buPROPion- XL 150 MG  Take 1 tab every morning.  Marland Kitchen. VITAMIN D 2000 UNITS tablet Take 10,000 Units b daily  . VITB 12 100 MCG LOZG Take by mouth.  . ATROVENT  nasal spray 2 SPRAYS INTO THE NOSE 3 x  DAILY.  Marland Kitchen. loratadine  10 MG tablet Take  daily.  . meloxicam  15 MG tablet Take 1 tab daily.  . montelukast  10 MG tablet TAKE 1 TAB EVERY DAY  . omeprazole  40 MG capsule TAKE 1 CAP DAILY.  Marland Kitchen. gummy multivitamin Patient takes 1  daily.  . pravastatin  40 MG tablet TAKE 1 TAB EVERY DAY  . sertraline  100 MG tablet Take 1 tab daily.  . traZODone 100 MG tablet TAKE 1 TAB AT BEDTIME.  . valsartan80 MG tablet TAKE 1/2 TO 1 TAB DAILY  . FLONASE  nasal spray Place 2 sprays into both nostrils daily.   Allergies  Allergen Reactions  . Erythromycin Nausea Only and Other (See Comments)    Stomach cramping (can take Z Pak)  . Flexeril [Cyclobenzaprine Hcl] Other (See Comments)    "don't like the way I feel"  . Metformin And Related     GI Upset       Review of Systems     Objective:   Physical Exam   BP (!) 162/98   Pulse 76   Temp 97.3 F (36.3 C)   Resp 16   Ht 5\' 1"  (1.549  m)   Wt 206 lb (93.4 kg)   BMI 38.92 kg/m   Congested cough. Sl hoarseness . No stridor.   Skin- Exposed is clear w/o rash.  HEENT - EAC'/TM's Nl. Bilat FrontoMaxillary tenderness. O/P is 2 (+) injected w/o exudates Neck - supple.  Chest - Bilat scattered medium rales and rhonchi and few forced post tussive exp fine wheezes. Cor - Nl HS. RRR w/o sig m. MS- FROM w/o deformities.  Gait Nl. Neuro -  Nl w/o focal abnormalities.    Assessment & Plan:   1. Acute non-recurrent pansinusitis   2. Laryngotracheitis  - predniSONE (DELTASONE) 20 MG tablet; 1 tab 3 x day for 3 days, then 1 tab 2 x day for 3 days, then 1 tab 1 x day for 5 days  Dispense: 20 tablet; Refill: 0 - azithromycin (ZITHROMAX) 250 MG tablet; Take 2 tablets (500 mg) on  Day 1,  followed by 1 tablet (250 mg) once daily on Days 2 through 5.  Dispense: 6 each; Refill: 1  - discussed diet, exercise , meds & SE's.   -  BP was noted elevated today and patient was advised to monitor at home 2 x / daily and call if remained elevated.

## 2016-06-10 NOTE — Patient Instructions (Signed)
Bronquitis aguda (Acute Bronchitis) Se denomina bronquitis cuando las vas respiratorias que van desde la trquea hasta los pulmones se irritan, se congestionan y duelen (se inflaman). La bronquitis generalmente produce flema espesa (mucosidad). Esto provoca tos. La tos es el sntoma ms frecuente de la bronquitis. Cuando la bronquitis es aguda, generalmente comienza de manera sbita y desaparece luego de algn tiempo (generalmente en 2 semanas). El hbito de fumar, las alergias y el asma pueden empeorar la bronquitis. Los episodios repetidos de bronquitis pueden causar ms problemas pulmonares. CUIDADOS EN EL HOGAR  Reposo.  Beba abundante cantidad de lquidos para mantener el pis orina) claro o amarillo plido (excepto que debe limitar la ingesta de lquidos por indicacin del mdico).  Tome slo medicamentos de venta libre o recetados, segn las indicaciones del mdico.  Evite fumar o aspirar el humo de otros fumadores. Esto puede empeorar la bronquitis. Si es fumador, considere el uso de chicles o parches en la piel de nicotina. Si deja de fumar, sus pulmones se curarn ms rpido.  Reduzca la probabilidad de enfermarse nuevamente de bronquitis de este modo: ? Lvese las manos con frecuencia. ? Evite las personas que tengan sntomas de resfro. ? Trate de no llevarse las manos a la boca, la nariz o los ojos.  Concurra a las consultas de control con el mdico, segn las indicaciones.  SOLICITE AYUDA SI: Los sntomas no mejoran despus de 1 semana de tratamiento. Los sntomas son:  Tos.  Fiebre.  Eliminar moco espeso al toser.  Dolores en el cuerpo.  Congestin en el pecho.  Escalofros.  Falta de aire.  Dolor de garganta. SOLICITE AYUDA DE INMEDIATO SI:  Le sube la fiebre.  Tiene escalofros.  Comienza a sentir que le falta el aire de manera preocupante.  Tiene expectoracin con sangre (esputo).  Devuelve (vomita) con frecuencia.  Su organismo pierde mucho  lquido (deshidratacin).  Sufre un dolor intenso de cabeza.  Se desmaya.  ASEGRESE DE QUE:  Comprende estas instrucciones.  Controlar su afeccin.  Recibir ayuda de inmediato si no mejora o si empeora.  Esta informacin no tiene como fin reemplazar el consejo del mdico. Asegrese de hacerle al mdico cualquier pregunta que tenga. Document Released: 09/19/2012 Document Revised: 07/08/2015 Document Reviewed: 07/08/2015 Elsevier Interactive Patient Education  2017 [employer]  

## 2016-06-21 ENCOUNTER — Other Ambulatory Visit: Payer: Self-pay | Admitting: Physician Assistant

## 2016-06-22 ENCOUNTER — Ambulatory Visit: Payer: Self-pay | Admitting: Internal Medicine

## 2016-06-22 ENCOUNTER — Ambulatory Visit (INDEPENDENT_AMBULATORY_CARE_PROVIDER_SITE_OTHER): Payer: 59 | Admitting: Physician Assistant

## 2016-06-22 ENCOUNTER — Encounter: Payer: Self-pay | Admitting: Physician Assistant

## 2016-06-22 VITALS — BP 132/100 | HR 97 | Temp 98.1°F | Resp 16 | Ht 61.0 in | Wt 206.4 lb

## 2016-06-22 DIAGNOSIS — E782 Mixed hyperlipidemia: Secondary | ICD-10-CM

## 2016-06-22 DIAGNOSIS — R7309 Other abnormal glucose: Secondary | ICD-10-CM | POA: Diagnosis not present

## 2016-06-22 DIAGNOSIS — I10 Essential (primary) hypertension: Secondary | ICD-10-CM

## 2016-06-22 DIAGNOSIS — E6609 Other obesity due to excess calories: Secondary | ICD-10-CM

## 2016-06-22 DIAGNOSIS — Z79899 Other long term (current) drug therapy: Secondary | ICD-10-CM

## 2016-06-22 DIAGNOSIS — J069 Acute upper respiratory infection, unspecified: Secondary | ICD-10-CM | POA: Diagnosis not present

## 2016-06-22 LAB — CBC WITH DIFFERENTIAL/PLATELET
BASOS ABS: 0 {cells}/uL (ref 0–200)
BASOS PCT: 0 %
EOS ABS: 102 {cells}/uL (ref 15–500)
Eosinophils Relative: 2 %
HEMATOCRIT: 36.7 % (ref 35.0–45.0)
Hemoglobin: 12.1 g/dL (ref 11.7–15.5)
LYMPHS PCT: 13 %
Lymphs Abs: 663 cells/uL — ABNORMAL LOW (ref 850–3900)
MCH: 30.7 pg (ref 27.0–33.0)
MCHC: 33 g/dL (ref 32.0–36.0)
MCV: 93.1 fL (ref 80.0–100.0)
MONO ABS: 765 {cells}/uL (ref 200–950)
MONOS PCT: 15 %
MPV: 8.8 fL (ref 7.5–12.5)
NEUTROS PCT: 70 %
Neutro Abs: 3570 cells/uL (ref 1500–7800)
PLATELETS: 248 10*3/uL (ref 140–400)
RBC: 3.94 MIL/uL (ref 3.80–5.10)
RDW: 14.1 % (ref 11.0–15.0)
WBC: 5.1 10*3/uL (ref 3.8–10.8)

## 2016-06-22 MED ORDER — PROMETHAZINE-DM 6.25-15 MG/5ML PO SYRP: 5.0000 mL | ORAL_SOLUTION | Freq: Four times a day (QID) | ORAL | 1 refills | Status: DC | PRN

## 2016-06-22 MED ORDER — LEVOFLOXACIN 500 MG PO TABS: 500.0000 mg | ORAL_TABLET | Freq: Every day | ORAL | 0 refills | Status: DC

## 2016-06-22 MED ORDER — ALBUTEROL SULFATE HFA 108 (90 BASE) MCG/ACT IN AERS: 2.0000 | INHALATION_SPRAY | RESPIRATORY_TRACT | 0 refills | Status: DC | PRN

## 2016-06-22 NOTE — Progress Notes (Signed)
Assessment and Plan:  Hypertension:  -Continue medication - monitor BP at home, likely from prednisone and OTC meds -monitor blood pressure at home. -Continue DASH diet -Reminder to go to the ER if any CP, SOB, nausea, dizziness, severe HA, changes vision/speech, left arm numbness and tingling and jaw pain.  Cholesterol - Continue diet and exercise -Check cholesterol.   PreDiabetes without complications -Continue diet and exercise.  -Check A1C  Vitamin D Def -check level -continue medications.   Chronic sinusitis -needs surgery for sinusitis levaquin  Morbid Obesity with co morbidities - long discussion about weight loss, diet, and exercise  Continue diet and meds as discussed. Further disposition pending results of labs. Discussed med's effects and SE's.    HPI 46 y.o. female  presents for 3 month follow up with hypertension, hyperlipidemia, diabetes and vitamin D deficiency.   Her blood pressure has been controlled at home, has been 130/90's at home, today their BP is BP: (!) 132/100.She does not workout. She denies chest pain, shortness of breath, dizziness.   She is on cholesterol medication and denies myalgias. Her cholesterol is at goal. The cholesterol was:  02/11/2016: Cholesterol 218; HDL 88; LDL Cholesterol 97; Triglycerides 163   She has been working on diet and exercise for diabetes without complications, she is on bASA, she is on ACE/ARB, and denies  foot ulcerations, hyperglycemia, hypoglycemia , increased appetite, nausea, paresthesia of the feet, polydipsia, polyuria, visual disturbances, vomiting and weight loss. Last A1C was: 02/11/2016: Hgb A1c MFr Bld 5.4.   Patient is on Vitamin D supplement.  Saw Dr. Oneta Rack on 05/11, has taken prednisone and 1 zpak without relief, has had fever, chills, cough with mucus, has vomited 5 times.   Looking for job right now.  BMI is Body mass index is 39 kg/m., she is working on diet and exercise. Wt Readings from Last 3  Encounters:  06/22/16 206 lb 6.4 oz (93.6 kg)  06/10/16 206 lb (93.4 kg)  03/17/16 202 lb (91.6 kg)     Current Medications:  Current Outpatient Prescriptions on File Prior to Visit  Medication Sig Dispense Refill  . albuterol (VENTOLIN HFA) 108 (90 Base) MCG/ACT inhaler Inhale 2 puffs into the lungs every 4 (four) hours as needed for wheezing or shortness of breath. 1 Inhaler 0  . amphetamine-dextroamphetamine (ADDERALL) 10 MG tablet Take 1 tablet (10 mg total) by mouth daily with breakfast. 30 tablet 0  . aspirin 81 MG tablet Take 81 mg by mouth daily.    . baclofen (LIORESAL) 10 MG tablet Take 1 tablet (10 mg total) by mouth 2 (two) times daily. Take 1/2 to 1 tablet 2 x day if needed for muscle spasm 60 tablet 1  . buPROPion (WELLBUTRIN XL) 150 MG 24 hr tablet Take 1 tablet (150 mg total) by mouth every morning. 90 tablet 0  . Cholecalciferol (VITAMIN D) 2000 UNITS tablet Take 10,000 Units by mouth daily. Pt takes [employer] Bone Health    . Cyanocobalamin (VITAMIN B 12) 100 MCG LOZG Take by mouth.    Marland Kitchen ipratropium (ATROVENT) 0.03 % nasal spray PLACE 2 SPRAYS INTO THE NOSE 3 (THREE) TIMES DAILY. 90 mL 1  . loratadine (CLARITIN) 10 MG tablet Take 10 mg by mouth daily.    . meloxicam (MOBIC) 15 MG tablet Take 1 tablet (15 mg total) by mouth daily. 90 tablet 0  . montelukast (SINGULAIR) 10 MG tablet TAKE 1 TABLET BY MOUTH EVERY DAY 90 tablet 3  . omeprazole (PRILOSEC) 40  MG capsule TAKE 1 CAPSULE (40 MG TOTAL) BY MOUTH DAILY. 90 capsule 1  . OVER THE COUNTER MEDICATION Patient takes 1 gummy multivitamin daily.    . pravastatin (PRAVACHOL) 40 MG tablet TAKE 1 TABLET BY MOUTH EVERY DAY 90 tablet 0  . sertraline (ZOLOFT) 100 MG tablet Take 1 tablet (100 mg total) by mouth daily. 90 tablet 1  . traZODone (DESYREL) 100 MG tablet TAKE 1 TABLET (100 MG TOTAL) BY MOUTH AT BEDTIME. 90 tablet 1  . valsartan (DIOVAN) 80 MG tablet TAKE 1/2 TO 1 TABLET BY MOUTH DAILY FOR BLOOD PRESSURE 90 tablet 0   . fluticasone (FLONASE) 50 MCG/ACT nasal spray Place 2 sprays into both nostrils daily. 16 g 2  . predniSONE (DELTASONE) 20 MG tablet 1 tab 3 x day for 3 days, then 1 tab 2 x day for 3 days, then 1 tab 1 x day for 5 days (Patient not taking: Reported on 06/22/2016) 20 tablet 0   No current facility-administered medications on file prior to visit.    Medical History:  Past Medical History:  Diagnosis Date  . Anxiety   . Depression    no meds  . Hyperlipidemia   . Hypertension    no meds   Allergies:  Allergies  Allergen Reactions  . Erythromycin Nausea Only and Other (See Comments)    Stomach cramping (can take Z Pak)  . Flexeril [Cyclobenzaprine Hcl] Other (See Comments)    "don't like the way I feel"  . Metformin And Related     GI Upset     Review of Systems:  Review of Systems  Constitutional: Negative for chills, fever and malaise/fatigue.  HENT: Negative for congestion, ear pain and sore throat.   Eyes: Negative.   Respiratory: Negative for cough, shortness of breath and wheezing.   Cardiovascular: Negative for chest pain, palpitations and leg swelling.  Gastrointestinal: Negative for abdominal pain, blood in stool, constipation, diarrhea, heartburn and melena.  Genitourinary: Negative.   Skin: Negative.   Neurological: Negative for dizziness, sensory change, loss of consciousness and headaches.  Psychiatric/Behavioral: Negative for depression. The patient is not nervous/anxious and does not have insomnia.     Family history- Review and unchanged  Social history- Review and unchanged  Physical Exam: BP (!) 132/100   Pulse 97   Temp 98.1 F (36.7 C)   Resp 16   Ht 5\' 1"  (1.549 m)   Wt 206 lb 6.4 oz (93.6 kg)   LMP 06/12/2016   SpO2 96%   BMI 39.00 kg/m  Wt Readings from Last 3 Encounters:  06/22/16 206 lb 6.4 oz (93.6 kg)  06/10/16 206 lb (93.4 kg)  03/17/16 202 lb (91.6 kg)   General Appearance: Well nourished well developed, non-toxic appearing, in  no apparent distress. Eyes: PERRLA, EOMs, conjunctiva no swelling or erythema ENT/Mouth: Ear canals clear with no erythema, swelling, or discharge.  TMs normal bilaterally, oropharynx clear, moist, with no exudate.   Neck: Supple, thyroid normal, no JVD, no cervical adenopathy.  Respiratory: Respiratory effort normal, breath sounds clear A&P, no wheeze, rhonchi or rales noted.  No retractions, no accessory muscle usage Cardio: RRR with no MRGs. No noted edema.  Abdomen: Soft, + BS.  Non tender, no guarding, rebound, hernias, masses. Musculoskeletal: Full ROM, 5/5 strength, Normal gait Skin: Warm, dry without rashes, lesions, ecchymosis.  Neuro: Awake and oriented X 3, Cranial nerves intact. No cerebellar symptoms.  Psych: normal affect, Insight and Judgment appropriate.    Quentin Mulling,  PA-C 5:12 PM [facility]

## 2016-06-22 NOTE — Patient Instructions (Signed)
Monitor your blood pressure at home. Go to the ER if any CP, SOB, nausea, dizziness, severe HA, changes vision/speech  Goal BP:  For patients younger than 60: Goal BP < 140/90. For patients 60 and older: Goal BP < 150/90. For patients with diabetes: Goal BP < 140/90. Your most recent BP: BP: (!) 132/100   Take your medications faithfully as instructed. Maintain a healthy weight. Get at least 150 minutes of aerobic exercise per week. Minimize salt intake. Minimize alcohol intake  DASH Eating Plan DASH stands for "Dietary Approaches to Stop Hypertension." The DASH eating plan is a healthy eating plan that has been shown to reduce high blood pressure (hypertension). Additional health benefits may include reducing the risk of type 2 diabetes mellitus, heart disease, and stroke. The DASH eating plan may also help with weight loss. WHAT DO I NEED TO KNOW ABOUT THE DASH EATING PLAN? For the DASH eating plan, you will follow these general guidelines:  Choose foods with a percent daily value for sodium of less than 5% (as listed on the food label).  Use salt-free seasonings or herbs instead of table salt or sea salt.  Check with your health care provider or pharmacist before using salt substitutes.  Eat lower-sodium products, often labeled as "lower sodium" or "no salt added."  Eat fresh foods.  Eat more vegetables, fruits, and low-fat dairy products.  Choose whole grains. Look for the word "whole" as the first word in the ingredient list.  Choose fish and skinless chicken or [place] more often than red meat. Limit fish, poultry, and meat to 6 oz (170 g) each day.  Limit sweets, desserts, sugars, and sugary drinks.  Choose heart-healthy fats.  Limit cheese to 1 oz (28 g) per day.  Eat more home-cooked food and less restaurant, buffet, and fast food.  Limit fried foods.  Cook foods using methods other than frying.  Limit canned vegetables. If you do use them, rinse them well to  decrease the sodium.  When eating at a restaurant, ask that your food be prepared with less salt, or no salt if possible. WHAT FOODS CAN I EAT? Seek help from a dietitian for individual calorie needs. Grains Whole grain or whole wheat bread. Brown rice. Whole grain or whole wheat pasta. Quinoa, bulgur, and whole grain cereals. Low-sodium cereals. Corn or whole wheat flour tortillas. Whole grain cornbread. Whole grain crackers. Low-sodium crackers. Vegetables Fresh or frozen vegetables (raw, steamed, roasted, or grilled). Low-sodium or reduced-sodium tomato and vegetable juices. Low-sodium or reduced-sodium tomato sauce and paste. Low-sodium or reduced-sodium canned vegetables.  Fruits All fresh, canned (in natural juice), or frozen fruits. Meat and Other Protein Products Ground beef (85% or leaner), grass-fed beef, or beef trimmed of fat. Skinless chicken or [place]. Ground chicken or [place]key. Pork trimmed of fat. All fish and seafood. Eggs. Dried beans, peas, or lentils. Unsalted nuts and seeds. Unsalted canned beans. Dairy Low-fat dairy products, such as skim or 1% milk, 2% or reduced-fat cheeses, low-fat ricotta or cottage cheese, or plain low-fat yogurt. Low-sodium or reduced-sodium cheeses. Fats and Oils Tub margarines without trans fats. Light or reduced-fat mayonnaise and salad dressings (reduced sodium). Avocado. Safflower, olive, or canola oils. Natural peanut or almond butter. Other Unsalted popcorn and pretzels. The items listed above may not be a complete list of recommended foods or beverages. Contact your dietitian for more options. WHAT FOODS ARE NOT RECOMMENDED? Grains White bread. White pasta. White rice. Refined cornbread. Bagels and croissants. Crackers that contain  trans fat. Vegetables Creamed or fried vegetables. Vegetables in a cheese sauce. Regular canned vegetables. Regular canned tomato sauce and paste. Regular tomato and vegetable juices. Fruits Dried fruits. Canned  fruit in light or heavy syrup. Fruit juice. Meat and Other Protein Products Fatty cuts of meat. Ribs, chicken wings, bacon, sausage, bologna, salami, chitterlings, fatback, hot dogs, bratwurst, and packaged luncheon meats. Salted nuts and seeds. Canned beans with salt. Dairy Whole or 2% milk, cream, half-and-half, and cream cheese. Whole-fat or sweetened yogurt. Full-fat cheeses or blue cheese. Nondairy creamers and whipped toppings. Processed cheese, cheese spreads, or cheese curds. Condiments Onion and garlic salt, seasoned salt, table salt, and sea salt. Canned and packaged gravies. Worcestershire sauce. Tartar sauce. Barbecue sauce. Teriyaki sauce. Soy sauce, including reduced sodium. Steak sauce. Fish sauce. Oyster sauce. Cocktail sauce. Horseradish. Ketchup and mustard. Meat flavorings and tenderizers. Bouillon cubes. Hot sauce. Tabasco sauce. Marinades. Taco seasonings. Relishes. Fats and Oils Butter, stick margarine, lard, shortening, ghee, and bacon fat. Coconut, palm kernel, or palm oils. Regular salad dressings. Other Pickles and olives. Salted popcorn and pretzels. The items listed above may not be a complete list of foods and beverages to avoid. Contact your dietitian for more information. WHERE CAN I FIND MORE INFORMATION? National Heart, Lung, and Blood Institute: [employer] Document Released: 01/06/2011 Document Revised: 06/03/2013 Document Reviewed: 11/21/2012 [facility] Patient Information 2015 [place], [place]. This information is not intended to replace advice given to you by your health care provider. Make sure you discuss any questions you have with your health care provider.   Simple math prevails.    1st - exercise does not produce significant weight loss - at best one converts fat into muscle , "bulks up", loses inches, but usually stays "weight neutral"     2nd - think of your body weightas a check book: If you eat more calories  than you burn up - you save money or gain weight .... Or if you spend more money than you put in the check book, ie burn up more calories than you eat, then you lose weight     3rd - if you walk or run 1 mile, you burn up 100 calories - you have to burn up 3,500 calories to lose 1 pound, ie you have to walk/run 35 miles to lose 1 measly pound. So if you want to lose 10 #, then you have to walk/run 350 miles, so.... clearly exercise is not the solution.     4. So if you consume 1,500 calories, then you have to burn up the equivalent of 15 miles to stay weight neutral - It also stands to reason that if you consume 1,500 cal/day and don't lose weight, then you must be burning up about 1,500 cals/day to stay weight neutral.     5. If you really want to lose weight, you must cut your calorie intake 300 calories /day and at that rate you should lose about 1 # every 3 days.   6. Please purchase Dr Francis Dowse Fuhrman's book(s) "The End of Dieting" & "Eat to Live" . It has some great concepts and recipes.      HOW TO TREAT VIRAL COUGH AND COLD SYMPTOMS:  -Symptoms usually last at least 1 week with the worst symptoms being around day 4.  - colds usually start with a sore throat and end with a cough, and the cough can take 2 weeks to get better.  -No antibiotics are needed for colds, flu, sore throats, cough,  bronchitis UNLESS symptoms are longer than 7 days OR if you are getting better then get drastically worse.  -There are a lot of combination medications (Dayquil, Nyquil, Vicks 44, tyelnol cold and sinus, ETC). Please look at the ingredients on the back so that you are treating the correct symptoms and not doubling up on medications/ingredients.    Medicines you can use  Nasal congestion  - pseudoephedrine (Sudafed)- behind the counter, do not use if you have high blood pressure, medicine that have -D in them.  - phenylephrine (Sudafed PE) -Dextormethorphan + chlorpheniramine (Coridcidin HBP)- okay if  you have high blood pressure -Oxymetazoline (Afrin) nasal spray- LIMIT to 3 days -Saline nasal spray -Neti pot (used distilled or bottled water)  Ear pain/congestion  -pseudoephedrine (sudafed) - Nasonex/flonase nasal spray  Fever  -Acetaminophen (Tyelnol) -Ibuprofen (Advil, motrin, aleve)  Sore Throat  -Acetaminophen (Tyelnol) -Ibuprofen (Advil, motrin, aleve) -Drink a lot of water -Gargle with salt water - Rest your voice (don't talk) -Throat sprays -Cough drops  Body Aches  -Acetaminophen (Tyelnol) -Ibuprofen (Advil, motrin, aleve)  Headache  -Acetaminophen (Tyelnol) -Ibuprofen (Advil, motrin, aleve) - Exedrin, Exedrin Migraine  Allergy symptoms (cough, sneeze, runny nose, itchy eyes) -Claritin or loratadine cheapest but likely the weakest  -Zyrtec or certizine at night because it can make you sleepy -The strongest is allegra or fexafinadine  Cheapest at [facility], [employer], [facility]  Cough  -Dextromethorphan (Delsym)- medicine that has DM in it -Guafenesin (Mucinex/Robitussin) - cough drops - drink lots of water  Chest Congestion  -Guafenesin (Mucinex/Robitussin)  Red Itchy Eyes  - Naphcon-A  Upset Stomach  - Bland diet (nothing spicy, greasy, fried, and high acid foods like tomatoes, oranges, berries) -OKAY- cereal, bread, soup, crackers, rice -Eat smaller more frequent meals -reduce caffeine, no alcohol -Loperamide (Imodium-AD) if diarrhea -Prevacid for heart burn  General health when sick  -Hydration -wash your hands frequently -keep surfaces clean -change pillow cases and sheets often -Get fresh air but do not exercise strenuously -Vitamin D, double up on it - Vitamin C -Zinc

## 2016-06-23 LAB — HEPATIC FUNCTION PANEL
ALBUMIN: 3.9 g/dL (ref 3.6–5.1)
ALK PHOS: 53 U/L (ref 33–115)
ALT: 12 U/L (ref 6–29)
AST: 16 U/L (ref 10–35)
BILIRUBIN TOTAL: 0.3 mg/dL (ref 0.2–1.2)
Bilirubin, Direct: 0.1 mg/dL (ref <=0.2)
Indirect Bilirubin: 0.2 mg/dL (ref 0.2–1.2)
TOTAL PROTEIN: 6.7 g/dL (ref 6.1–8.1)

## 2016-06-23 LAB — HEMOGLOBIN A1C
Hgb A1c MFr Bld: 5.4 % (ref <5.7)
MEAN PLASMA GLUCOSE: 108 mg/dL

## 2016-06-23 LAB — LIPID PANEL
Cholesterol: 226 mg/dL — ABNORMAL HIGH (ref <200)
HDL: 83 mg/dL (ref >50)
LDL CALC: 109 mg/dL — AB (ref <100)
TRIGLYCERIDES: 170 mg/dL — AB (ref <150)
Total CHOL/HDL Ratio: 2.7 Ratio (ref <5.0)
VLDL: 34 mg/dL — ABNORMAL HIGH (ref <30)

## 2016-06-23 LAB — BASIC METABOLIC PANEL WITH GFR
BUN: 12 mg/dL (ref 7–25)
CO2: 22 mmol/L (ref 20–31)
CREATININE: 1.05 mg/dL (ref 0.50–1.10)
Calcium: 9.2 mg/dL (ref 8.6–10.2)
Chloride: 102 mmol/L (ref 98–110)
GFR, EST AFRICAN AMERICAN: 74 mL/min (ref >=60)
GFR, Est Non African American: 64 mL/min (ref >=60)
GLUCOSE: 92 mg/dL (ref 65–99)
Potassium: 4.6 mmol/L (ref 3.5–5.3)
Sodium: 137 mmol/L (ref 135–146)

## 2016-06-23 LAB — TSH: TSH: 1.93 m[IU]/L

## 2016-06-23 LAB — MAGNESIUM: MAGNESIUM: 1.9 mg/dL (ref 1.5–2.5)

## 2016-07-06 ENCOUNTER — Other Ambulatory Visit: Payer: Self-pay

## 2016-07-06 DIAGNOSIS — M5441 Lumbago with sciatica, right side: Secondary | ICD-10-CM

## 2016-07-06 MED ORDER — BACLOFEN 10 MG PO TABS: 10.0000 mg | ORAL_TABLET | Freq: Two times a day (BID) | ORAL | 1 refills | Status: DC

## 2016-07-31 ENCOUNTER — Other Ambulatory Visit: Payer: Self-pay | Admitting: Internal Medicine

## 2016-08-09 DIAGNOSIS — M76821 Posterior tibial tendinitis, right leg: Secondary | ICD-10-CM | POA: Diagnosis not present

## 2016-08-09 DIAGNOSIS — M79671 Pain in right foot: Secondary | ICD-10-CM | POA: Diagnosis not present

## 2016-08-15 ENCOUNTER — Other Ambulatory Visit: Payer: Self-pay | Admitting: Podiatry

## 2016-08-15 DIAGNOSIS — G8929 Other chronic pain: Secondary | ICD-10-CM

## 2016-08-15 DIAGNOSIS — M25571 Pain in right ankle and joints of right foot: Principal | ICD-10-CM

## 2016-08-23 DIAGNOSIS — R2689 Other abnormalities of gait and mobility: Secondary | ICD-10-CM | POA: Diagnosis not present

## 2016-08-23 DIAGNOSIS — M25571 Pain in right ankle and joints of right foot: Secondary | ICD-10-CM | POA: Diagnosis not present

## 2016-08-23 DIAGNOSIS — M65871 Other synovitis and tenosynovitis, right ankle and foot: Secondary | ICD-10-CM | POA: Diagnosis not present

## 2016-08-31 ENCOUNTER — Ambulatory Visit
Admission: RE | Admit: 2016-08-31 | Discharge: 2016-08-31 | Disposition: A | Discharge status: Home / Self Care | Payer: 59 | Source: Ambulatory Visit | Attending: Podiatry | Admitting: Podiatry

## 2016-08-31 DIAGNOSIS — M25571 Pain in right ankle and joints of right foot: Principal | ICD-10-CM

## 2016-08-31 DIAGNOSIS — G8929 Other chronic pain: Secondary | ICD-10-CM

## 2016-09-04 ENCOUNTER — Other Ambulatory Visit: Payer: Self-pay | Admitting: Internal Medicine

## 2016-09-16 ENCOUNTER — Other Ambulatory Visit: Payer: Self-pay | Admitting: Physician Assistant

## 2016-09-29 ENCOUNTER — Encounter: Payer: Self-pay | Admitting: Obstetrics & Gynecology

## 2016-10-04 ENCOUNTER — Telehealth: Payer: Self-pay | Admitting: Physician Assistant

## 2016-10-04 NOTE — Telephone Encounter (Signed)
Cough Sunday with scratchy throat and sneezing, no fever, patient requesting norel.   Will send information via mychart, make sure on allegra, flonase, can get OTC meds. Can send in norel but very expensive, can find same medication for cheaper in the store but without the tylenol.

## 2016-10-04 NOTE — Telephone Encounter (Signed)
Lvm for pt to check her my chart message & return call if there are any ?s.

## 2016-10-22 ENCOUNTER — Other Ambulatory Visit: Payer: Self-pay | Admitting: Internal Medicine

## 2016-10-24 DIAGNOSIS — E559 Vitamin D deficiency, unspecified: Secondary | ICD-10-CM | POA: Insufficient documentation

## 2016-10-24 NOTE — Progress Notes (Signed)
FOLLOW UP  Assessment and Plan:    Hypertension  Continue medication  Start monitoring blood pressure at home.  Continue DASH diet.    Reminder to go to the ER if any CP, SOB, nausea, dizziness, severe HA, changes vision/speech, left arm numbness and tingling and jaw pain.  Cholesterol  Had a discussion about changing medication vs increased effort with lifestyle changes; she would like to increase effort with diet today, but revisit switching to a high-intensity statin at next visit.  Continue diet and exercise.   Check lipid panel.   Obesity  long discussion about weight loss, diet, and exercise  recommended diet heavy in fruits and veggies and low in animal meats, cheeses, and dairy products, and making problem foods more inconvenient to access  Work up to 80-100 fluid ounces of water/unsweetened drinks  Depression  continue medications  stress management techniques discussed  increase water, good sleep hygiene discussed, increase exercise, and increase veggies.   GERD  Continue PPI for now; need to work on reducing stress  Starting H2 blocker in the evenings for breakthrough reflux  diet discussed  Recurrent sinusitis  Encouraged to continue daily allergy medications  Follow up with ENT to schedule septal repair surgery    Continue diet and meds as discussed. Further disposition pending results of labs. Discussed med's effects and SE's.   Over 30 minutes of exam, counseling, chart review, and critical decision making was performed.   Future Appointments Date Time Provider Department Center  11/09/2016 11:00 AM Genia Del, MD [facility]  02/22/2017 10:00 AM Quentin Mulling, PA-C [facility] None    ----------------------------------------------------------------------------------------------------------------------  HPI 46 y.o. female  presents for 3 month follow up on hypertension, cholesterol, obesity, and mental health concerns. 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 We discussed this today, and she feels she is agreeable to try to schedule this to be done in December. Encouraged her to continue with daily allergy medications.   She reports she was having breakthrough acid reflux in the evenings, and is now taking her PPI at noon. She reports some nausea without pain or vomiting in the mornings that she feels is related to this. Denies blood/hematochezia/abdominal pain after meals. Adding ranitidine in the evenings to see if this will improve her symptoms.   BMI is Body mass index is 39.6 kg/m., she has been working on walking 20 min with her dog daily; commended her and encouraged her to continue. She reports she knows her diet has not been the best; had an extensive discussion regarding some small changes that she may make, encouraged plant-based diet which the patient has reportedly been successful with in the past.   Wt Readings from Last 3 Encounters:  10/25/16 209 lb 9.6 oz (95.1 kg)  06/22/16 206 lb 6.4 oz (93.6 kg)  06/10/16 206 lb (93.4 kg)    Her blood pressure has been controlled at home, today their BP is BP: 120/82  She does workout. She denies chest pain, shortness of breath, dizziness.   She is on cholesterol medication and denies myalgias. Her cholesterol is not at goal. The cholesterol last visit was:   Lab Results  Component Value Date   CHOL 226 (  H) 06/22/2016   HDL 83 06/22/2016   LDLCALC 109 (H) 06/22/2016   TRIG 170 (H) 06/22/2016   CHOLHDL 2.7 06/22/2016   Had a discussion about changing medications vs making more lifestyle changes; she would like to postpone medication change at this time, and make some changes in her diet.      Current Medications:  Current Outpatient Prescriptions on File Prior to Visit  Medication Sig  . albuterol (VENTOLIN HFA) 108 (90 Base) MCG/ACT inhaler Inhale 2 puffs into the lungs every 4 (four) hours as needed for wheezing or shortness of breath.  . amphetamine-dextroamphetamine (ADDERALL) 10 MG tablet Take 1 tablet (10 mg total) by mouth daily with breakfast.  . aspirin 81 MG tablet Take 81 mg by mouth daily.  . baclofen (LIORESAL) 10 MG tablet Take 1 tablet (10 mg total) by mouth 2 (two) times daily. Take 1/2 to 1 tablet 2 x day if needed for muscle spasm  . buPROPion (WELLBUTRIN XL) 150 MG 24 hr tablet Take 1 tablet (150 mg total) by mouth every morning.  . Cholecalciferol (VITAMIN D) 2000 UNITS tablet Take 2,000 Units by mouth daily. Pt takes [employer]  . Cyanocobalamin (VITAMIN B 12) 100 MCG LOZG Take by mouth.  Marland Kitchen ipratropium (ATROVENT) 0.03 % nasal spray PLACE 2 SPRAYS INTO THE NOSE 3 (THREE) TIMES DAILY.  . meloxicam (MOBIC) 15 MG tablet Take 1 tablet (15 mg total) by mouth daily.  . montelukast (SINGULAIR) 10 MG tablet TAKE 1 TABLET BY MOUTH EVERY DAY  . omeprazole (PRILOSEC) 40 MG capsule TAKE 1 CAPSULE BY MOUTH DAILY  . OVER THE COUNTER MEDICATION Patient takes 1 gummy multivitamin daily.  . pravastatin (PRAVACHOL) 40 MG tablet TAKE 1 TABLET BY MOUTH EVERY DAY  . promethazine-dextromethorphan (PROMETHAZINE-DM) 6.25-15 MG/5ML syrup Take 5 mLs by mouth 4 (four) times daily as needed for cough.  . sertraline (ZOLOFT) 100 MG tablet TAKE 1 TABLET BY MOUTH EVERY DAY  . traZODone (DESYREL) 100 MG tablet TAKE 1 TABLET (100 MG TOTAL) BY MOUTH AT BEDTIME.  . valsartan (DIOVAN) 80 MG tablet TAKE 1/2 TO 1 TABLET BY MOUTH DAILY FOR BLOOD PRESSURE  . fluticasone (FLONASE) 50 MCG/ACT nasal spray Place 2 sprays into both nostrils daily.   No current facility-administered medications on file prior to visit.     Flu shot administered today.    Allergies:  Allergies   Allergen Reactions  . Erythromycin Nausea Only and Other (See Comments)    Stomach cramping (can take Z Pak)  . Flexeril [Cyclobenzaprine Hcl] Other (See Comments)    "don't like the way I feel"  . Metformin And Related     GI Upset     Medical History:  Past Medical History:  Diagnosis Date  . Anxiety   . Depression    no meds  . Hyperlipidemia   . Hypertension    no meds   Family history- Reviewed and unchanged Social history- Reviewed and unchanged   Review of Systems:  Review of Systems  Constitutional: Negative.  Negative for chills, diaphoresis, fever and malaise/fatigue.  HENT: Positive for congestion. Negative for ear pain, hearing loss, nosebleeds, sinus pain and sore throat.   Eyes: Negative for blurred vision.  Respiratory: Negative for cough, sputum production, shortness of breath and wheezing.   Cardiovascular: Negative for chest pain, palpitations and leg swelling.  Gastrointestinal: Positive for heartburn (Breakthrough in evenings) and nausea (Intermittent in AM without vomiting). Negative for abdominal pain, blood in stool, constipation,  diarrhea, melena and vomiting.  Genitourinary: Negative.   Musculoskeletal: Negative for myalgias.  Skin: Negative.   Neurological: Negative for dizziness, sensory change and headaches.  Endo/Heme/Allergies: Negative.   Psychiatric/Behavioral: Negative.  Negative for depression. The patient is not nervous/anxious (Gets "worked up" sometimes) and does not have insomnia.       Physical Exam: BP 120/82   Pulse 76   Temp (!) 97.5 F (36.4 C)   Resp 18   Ht  (1.549 m)   Wt 209 lb 9.6 oz (95.1 kg)   BMI 39.60 kg/m   General Appearance: Well nourished, obese woman in no apparent distress. Eyes: PERRLA, EOMs, conjunctiva no swelling or erythema Sinuses: No Frontal/maxillary tenderness, nasal mucosa injected/inflamed, some yellow mucus present in nares.  ENT/Mouth: Ext aud canals clear, TMs without erythema,  bulging, bilateral effusions. No erythema, swelling, or exudate on post pharynx.  Tonsils not swollen or erythematous. Hearing normal.  Neck: Supple, thyroid normal.  Respiratory: Respiratory effort normal, BS equal bilaterally without rales, rhonchi, wheezing or stridor.  Cardio: RRR with no MRGs. Brisk peripheral pulses without edema.  Abdomen: Soft, + BS.  Non tender, no guarding, rebound, hernias, masses. Lymphatics: Non tender without lymphadenopathy.  Musculoskeletal: Full ROM, 5/5 strength, Normal gait Skin: Warm, dry without rashes, lesions, ecchymosis.  Neuro: Cranial nerves intact. No cerebellar symptoms.  Psych: Awake and oriented X 3, normal affect, Insight and Judgment appropriate.    Dan Maker, NP 11:46 AM [facility]

## 2016-10-25 ENCOUNTER — Ambulatory Visit (INDEPENDENT_AMBULATORY_CARE_PROVIDER_SITE_OTHER): Payer: 59 | Admitting: Adult Health

## 2016-10-25 ENCOUNTER — Encounter: Payer: Self-pay | Admitting: Adult Health

## 2016-10-25 VITALS — BP 120/82 | HR 76 | Temp 97.5°F | Resp 18 | Ht 61.0 in | Wt 209.6 lb

## 2016-10-25 DIAGNOSIS — K219 Gastro-esophageal reflux disease without esophagitis: Secondary | ICD-10-CM

## 2016-10-25 DIAGNOSIS — I10 Essential (primary) hypertension: Secondary | ICD-10-CM | POA: Diagnosis not present

## 2016-10-25 DIAGNOSIS — Z23 Encounter for immunization: Secondary | ICD-10-CM

## 2016-10-25 DIAGNOSIS — E782 Mixed hyperlipidemia: Secondary | ICD-10-CM

## 2016-10-25 DIAGNOSIS — E559 Vitamin D deficiency, unspecified: Secondary | ICD-10-CM | POA: Diagnosis not present

## 2016-10-25 DIAGNOSIS — Z79899 Other long term (current) drug therapy: Secondary | ICD-10-CM | POA: Diagnosis not present

## 2016-10-25 DIAGNOSIS — J329 Chronic sinusitis, unspecified: Secondary | ICD-10-CM

## 2016-10-25 MED ORDER — RANITIDINE HCL 300 MG PO TABS: ORAL_TABLET | ORAL | 2 refills | Status: DC

## 2016-10-25 NOTE — Patient Instructions (Addendum)
Try identifying some of your "greatest weaknesses" and make it inconvenient/more difficult to do: Such as only let yourself go to a [employer] that is 15 min out of your way, put chips in serving-size baggies and put in a high cupboard, only take 1 down at a time. If you notice any other items that you would like to reduce, use the same concept to make it just a bit inconvenient to get to.   If you are not doing so, aim for 80+ fluid ounces of water/unsweetened tea a day- most people eat when they are actually thirsty and tend to be chronically dehydrated and dry. If you struggle with flavor, I like to add lemon or fruit to water, or brew a fruit tea and drink it iced with a bit of stevia.    Please pick 1-3 things a month to change. Once it is a habit check off the item. Then pick another three items off the list to become habits.  If you are already doing a habit on the list GREAT!  Cross that item off! o Don't drink your calories. Ie, alcohol, soda, fruit juice, and sweet tea.  o Drink more water. Drink a glass when you feel hungry or before each meal.  o Eat breakfast - Complex carb and protein (likeDannon light and fit yogurt, oatmeal, fruit, eggs, [place] bacon). o Measure your cereal.  Eat no more than one cup a day. (ie [place]) o Eat an apple a day. o Add a vegetable a day. o Try a new vegetable a month. o Use Pam! Stop using oil or butter to cook. o Don't finish your plate or use smaller plates. o Share your dessert. o Eat sugar free Jello for dessert or frozen grapes. o Don't eat 2-3 hours before bed. o Switch to whole wheat bread, pasta, and brown rice. o Make healthier choices when you eat out. No fries! o Pick baked chicken, NOT fried. o Don't forget to SLOW DOWN when you eat. It is not going anywhere.  o Take the stairs. o Park far away in the parking lot o [employer] (or weights) for 10 minutes while watching TV. o Walk at work for 10 minutes during break. o Walk outside 1  time a week with your friend, kids, dog, or significant other. o Start a walking group at church. o Walk the mall as much as you can tolerate.  o Keep a food diary. o Weigh yourself daily. o Walk for 15 minutes 3 days per week. o Cook at home more often and eat out less.  If life happens and you go back to old habits, it is okay.  Just start over. You can do it!   If you experience chest pain, get short of breath, or tired during the exercise, please stop immediately and inform your doctor.

## 2016-10-26 LAB — CBC WITH DIFFERENTIAL/PLATELET
BASOS PCT: 0.3 %
Basophils Absolute: 18 cells/uL (ref 0–200)
EOS PCT: 1.3 %
Eosinophils Absolute: 77 cells/uL (ref 15–500)
HCT: 35.9 % (ref 35.0–45.0)
Hemoglobin: 12.4 g/dL (ref 11.7–15.5)
Lymphs Abs: 2118 cells/uL (ref 850–3900)
MCH: 31.9 pg (ref 27.0–33.0)
MCHC: 34.5 g/dL (ref 32.0–36.0)
MCV: 92.3 fL (ref 80.0–100.0)
MONOS PCT: 10.6 %
MPV: 9.5 fL (ref 7.5–12.5)
Neutro Abs: 3062 cells/uL (ref 1500–7800)
Neutrophils Relative %: 51.9 %
PLATELETS: 285 10*3/uL (ref 140–400)
RBC: 3.89 10*6/uL (ref 3.80–5.10)
RDW: 12.5 % (ref 11.0–15.0)
TOTAL LYMPHOCYTE: 35.9 %
WBC mixed population: 625 cells/uL (ref 200–950)
WBC: 5.9 10*3/uL (ref 3.8–10.8)

## 2016-10-26 LAB — HEPATIC FUNCTION PANEL
AG RATIO: 1.6 (calc) (ref 1.0–2.5)
ALKALINE PHOSPHATASE (APISO): 60 U/L (ref 33–115)
ALT: 29 U/L (ref 6–29)
AST: 27 U/L (ref 10–35)
Albumin: 4.2 g/dL (ref 3.6–5.1)
BILIRUBIN DIRECT: 0.1 mg/dL (ref 0.0–0.2)
BILIRUBIN INDIRECT: 0.2 mg/dL (ref 0.2–1.2)
BILIRUBIN TOTAL: 0.3 mg/dL (ref 0.2–1.2)
Globulin: 2.6 g/dL (calc) (ref 1.9–3.7)
Total Protein: 6.8 g/dL (ref 6.1–8.1)

## 2016-10-26 LAB — LIPID PANEL
CHOL/HDL RATIO: 3.3 (calc) (ref <5.0)
CHOLESTEROL: 200 mg/dL — AB (ref <200)
HDL: 60 mg/dL (ref >50)
LDL CHOLESTEROL (CALC): 107 mg/dL — AB
Non-HDL Cholesterol (Calc): 140 mg/dL (calc) — ABNORMAL HIGH (ref <130)
Triglycerides: 213 mg/dL — ABNORMAL HIGH (ref <150)

## 2016-10-26 LAB — BASIC METABOLIC PANEL WITH GFR
BUN: 10 mg/dL (ref 7–25)
CO2: 24 mmol/L (ref 20–32)
Calcium: 9.2 mg/dL (ref 8.6–10.2)
Chloride: 105 mmol/L (ref 98–110)
Creat: 0.87 mg/dL (ref 0.50–1.10)
GFR, EST AFRICAN AMERICAN: 93 mL/min/{1.73_m2} (ref >=60)
GFR, EST NON AFRICAN AMERICAN: 80 mL/min/{1.73_m2} (ref >=60)
Glucose, Bld: 87 mg/dL (ref 65–99)
POTASSIUM: 4.1 mmol/L (ref 3.5–5.3)
SODIUM: 138 mmol/L (ref 135–146)

## 2016-10-26 LAB — VITAMIN D 25 HYDROXY (VIT D DEFICIENCY, FRACTURES): VIT D 25 HYDROXY: 47 ng/mL (ref 30–100)

## 2016-10-26 LAB — MAGNESIUM: MAGNESIUM: 2.2 mg/dL (ref 1.5–2.5)

## 2016-11-09 ENCOUNTER — Ambulatory Visit (INDEPENDENT_AMBULATORY_CARE_PROVIDER_SITE_OTHER): Payer: 59 | Admitting: Obstetrics & Gynecology

## 2016-11-09 ENCOUNTER — Encounter: Payer: Self-pay | Admitting: Obstetrics & Gynecology

## 2016-11-09 VITALS — BP 136/88 | Ht 61.0 in | Wt 205.0 lb

## 2016-11-09 DIAGNOSIS — Z1151 Encounter for screening for human papillomavirus (HPV): Secondary | ICD-10-CM | POA: Diagnosis not present

## 2016-11-09 DIAGNOSIS — F331 Major depressive disorder, recurrent, moderate: Secondary | ICD-10-CM

## 2016-11-09 DIAGNOSIS — Z3041 Encounter for surveillance of contraceptive pills: Secondary | ICD-10-CM | POA: Diagnosis not present

## 2016-11-09 DIAGNOSIS — Z01419 Encounter for gynecological examination (general) (routine) without abnormal findings: Secondary | ICD-10-CM | POA: Diagnosis not present

## 2016-11-09 MED ORDER — NORETHIN ACE-ETH ESTRAD-FE 1-20 MG-MCG(24) PO TABS: 1.0000 | ORAL_TABLET | Freq: Every day | ORAL | 4 refills | Status: DC

## 2016-11-09 NOTE — Progress Notes (Signed)
Samantha Greene [birth year] [id number]   History:    46 y.o. G1P0A1 married  RP:  Established patient presenting for annual gyn exam   HPI:  Ran out of BCPs x 1 month.  Abstinent since ran out of pills.  LMP normal 10/28/2016.  No pelvic pain.  Breasts wnl.  Mictions/BMs wnl.  Past medical history,surgical history, family history and social history were all reviewed and documented in the EPIC chart.  Gynecologic History Patient's last menstrual period was 10/28/2016. Contraception: abstinence and OCP (estrogen/progesterone) (until 1 month ago) Last Pap: 2016. Results were: normal Last mammogram: 2017. Results were: normal  Obstetric History OB History  Gravida Para Term Preterm AB Living  1 0       0  SAB TAB Ectopic Multiple Live Births               # Outcome Date GA Lbr Len/2nd Weight Sex Delivery Anes PTL Lv  1 Gravida                ROS: A ROS was performed and pertinent positives and negatives are included in the history.  GENERAL: No fevers or chills. HEENT: No change in vision, no earache, sore throat or sinus congestion. NECK: No pain or stiffness. CARDIOVASCULAR: No chest pain or pressure. No palpitations. PULMONARY: No shortness of breath, cough or wheeze. GASTROINTESTINAL: No abdominal pain, nausea, vomiting or diarrhea, melena or bright red blood per rectum. GENITOURINARY: No urinary frequency, urgency, hesitancy or dysuria. MUSCULOSKELETAL: No joint or muscle pain, no back pain, no recent trauma. DERMATOLOGIC: No rash, no itching, no lesions. ENDOCRINE: No polyuria, polydipsia, no heat or cold intolerance. No recent change in weight. HEMATOLOGICAL: No anemia or easy bruising or bleeding. NEUROLOGIC: No headache, seizures, numbness, tingling or weakness. PSYCHIATRIC: No depression, no loss of interest in normal activity or change in sleep pattern.     Exam:   BP 136/88   Ht  (1.549 m)   Wt 205 lb (93 kg)   LMP 10/28/2016   BMI 38.73 kg/m   Body mass  index is 38.73 kg/m.  General appearance : Well developed well nourished female. No acute distress HEENT: Eyes: no retinal hemorrhage or exudates,  Neck supple, trachea midline, no carotid bruits, no thyroidmegaly Lungs: Clear to auscultation, no rhonchi or wheezes, or rib retractions  Heart: Regular rate and rhythm, no murmurs or gallops Breast:Examined in sitting and supine position were symmetrical in appearance, no palpable masses or tenderness,  no skin retraction, no nipple inversion, no nipple discharge, no skin discoloration, no axillary or supraclavicular lymphadenopathy Abdomen: no palpable masses or tenderness, no rebound or guarding Extremities: no edema or skin discoloration or tenderness  Pelvic: Vulva normal  Bartholin, Urethra, Skene Glands: Within normal limits             Vagina: No gross lesions or discharge  Cervix: No gross lesions or discharge.  Pap/HPV HR done.  Uterus  AV, normal size, shape and consistency, non-tender and mobile  Adnexa  Without masses or tenderness  Anus and perineum  normal    Assessment/Plan:  45 y.o. female for annual exam   1. Encounter for routine gynecological examination with Papanicolaou smear of cervix Normal gyn exam.  Pap/HPV done.  Breasts wnl.  2. Encounter for surveillance of contraceptive pills LoEstrin Fe 1/20 (24) prescribed.  Will restart with next period.  Usage/Risks previously discussed.  3. Moderate episode of recurrent major depressive disorder (HCC) Followed closely on Anti-Depressants.  No suicidal ideations.  Genia Del MD, 11:30 AM 11/09/2016

## 2016-11-11 LAB — PAP, TP IMAGING W/ HPV RNA, RFLX HPV TYPE 16,18/45: HPV DNA HIGH RISK: NOT DETECTED

## 2016-11-12 NOTE — Patient Instructions (Signed)
1. Encounter for routine gynecological examination with Papanicolaou smear of cervix Normal gyn exam.  Pap/HPV done.  Breasts wnl.  2. Encounter for surveillance of contraceptive pills LoEstrin Fe 1/20 (24) prescribed.  Will restart with next period.  Usage/Risks previously discussed.  3. Moderate episode of recurrent major depressive disorder (HCC) Followed closely on Anti-Depressants.  No suicidal ideations.  Samantha Greene, it was a pleasure to see you today!  I will inform you of your results as soon as available.  Health Maintenance, Female Adopting a healthy lifestyle and getting preventive care can go a long way to promote health and wellness. Talk with your health care provider about what schedule of regular examinations is right for you. This is a good chance for you to check in with your provider about disease prevention and staying healthy. In between checkups, there are plenty of things you can do on your own. Experts have done a lot of research about which lifestyle changes and preventive measures are most likely to keep you healthy. Ask your health care provider for more information. Weight and diet Eat a healthy diet  Be sure to include plenty of vegetables, fruits, low-fat dairy products, and lean protein.  Do not eat a lot of foods high in solid fats, added sugars, or salt.  Get regular exercise. This is one of the most important things you can do for your health. ? Most adults should exercise for at least 150 minutes each week. The exercise should increase your heart rate and make you sweat (moderate-intensity exercise). ? Most adults should also do strengthening exercises at least twice a week. This is in addition to the moderate-intensity exercise.  Maintain a healthy weight  Body mass index (BMI) is a measurement that can be used to identify possible weight problems. It estimates body fat based on height and weight. Your health care provider can help determine your BMI and  help you achieve or maintain a healthy weight.  For females 77 years of age and older: ? A BMI below 18.5 is considered underweight. ? A BMI of 18.5 to 24.9 is normal. ? A BMI of 25 to 29.9 is considered overweight. ? A BMI of 30 and above is considered obese.  Watch levels of cholesterol and blood lipids  You should start having your blood tested for lipids and cholesterol at 47 years of age, then have this test every 5 years.  You may need to have your cholesterol levels checked more often if: ? Your lipid or cholesterol levels are high. ? You are older than 46 years of age. ? You are at high risk for heart disease.  Cancer screening Lung Cancer  Lung cancer screening is recommended for adults 41-87 years old who are at high risk for lung cancer because of a history of smoking.  A yearly low-dose CT scan of the lungs is recommended for people who: ? Currently smoke. ? Have quit within the past 15 years. ? Have at least a 30-pack-year history of smoking. A pack year is smoking an average of one pack of cigarettes a day for 1 year.  Yearly screening should continue until it has been 15 years since you quit.  Yearly screening should stop if you develop a health problem that would prevent you from having lung cancer treatment.  Breast Cancer  Practice breast self-awareness. This means understanding how your breasts normally appear and feel.  It also means doing regular breast self-exams. Let your health care provider know about any  changes, no matter how small.  If you are in your 20s or 30s, you should have a clinical breast exam (CBE) by a health care provider every 1-3 years as part of a regular health exam.  If you are 79 or older, have a CBE every year. Also consider having a breast X-ray (mammogram) every year.  If you have a family history of breast cancer, talk to your health care provider about genetic screening.  If you are at high risk for breast cancer, talk to  your health care provider about having an MRI and a mammogram every year.  Breast cancer gene (BRCA) assessment is recommended for women who have family members with BRCA-related cancers. BRCA-related cancers include: ? Breast. ? Ovarian. ? Tubal. ? Peritoneal cancers.  Results of the assessment will determine the need for genetic counseling and BRCA1 and BRCA2 testing.  Cervical Cancer Your health care provider may recommend that you be screened regularly for cancer of the pelvic organs (ovaries, uterus, and vagina). This screening involves a pelvic examination, including checking for microscopic changes to the surface of your cervix (Pap test). You may be encouraged to have this screening done every 3 years, beginning at age 11.  For women ages 89-65, health care providers may recommend pelvic exams and Pap testing every 3 years, or they may recommend the Pap and pelvic exam, combined with testing for human papilloma virus (HPV), every 5 years. Some types of HPV increase your risk of cervical cancer. Testing for HPV may also be done on women of any age with unclear Pap test results.  Other health care providers may not recommend any screening for nonpregnant women who are considered low risk for pelvic cancer and who do not have symptoms. Ask your health care provider if a screening pelvic exam is right for you.  If you have had past treatment for cervical cancer or a condition that could lead to cancer, you need Pap tests and screening for cancer for at least 20 years after your treatment. If Pap tests have been discontinued, your risk factors (such as having a new sexual partner) need to be reassessed to determine if screening should resume. Some women have medical problems that increase the chance of getting cervical cancer. In these cases, your health care provider may recommend more frequent screening and Pap tests.  Colorectal Cancer  This type of cancer can be detected and often  prevented.  Routine colorectal cancer screening usually begins at 47 years of age and continues through 47 years of age.  Your health care provider may recommend screening at an earlier age if you have risk factors for colon cancer.  Your health care provider may also recommend using home test kits to check for hidden blood in the stool.  A small camera at the end of a tube can be used to examine your colon directly (sigmoidoscopy or colonoscopy). This is done to check for the earliest forms of colorectal cancer.  Routine screening usually begins at age 22.  Direct examination of the colon should be repeated every 5-10 years through 47 years of age. However, you may need to be screened more often if early forms of precancerous polyps or small growths are found.  Skin Cancer  Check your skin from head to toe regularly.  Tell your health care provider about any new moles or changes in moles, especially if there is a change in a mole's shape or color.  Also tell your health care provider if  have a mole that is larger than the size of a pencil eraser.  Always use sunscreen. Apply sunscreen liberally and repeatedly throughout the day.  Protect yourself by wearing long sleeves, pants, a wide-brimmed hat, and sunglasses whenever you are outside.  Heart disease, diabetes, and high blood pressure  High blood pressure causes heart disease and increases the risk of stroke. High blood pressure is more likely to develop in: ? People who have blood pressure in the high end of the normal range (130-139/85-89 mm Hg). ? People who are overweight or obese. ? People who are African American.  If you are 18-39 years of age, have your blood pressure checked every 3-5 years. If you are 40 years of age or older, have your blood pressure checked every year. You should have your blood pressure measured twice-once when you are at a hospital or clinic, and once when you are not at a hospital or clinic.  Record the average of the two measurements. To check your blood pressure when you are not at a hospital or clinic, you can use: ? An automated blood pressure machine at a pharmacy. ? A home blood pressure monitor.  If you are between 55 years and 79 years old, ask your health care provider if you should take aspirin to prevent strokes.  Have regular diabetes screenings. This involves taking a blood sample to check your fasting blood sugar level. ? If you are at a normal weight and have a low risk for diabetes, have this test once every three years after 47 years of age. ? If you are overweight and have a high risk for diabetes, consider being tested at a younger age or more often. Preventing infection Hepatitis B  If you have a higher risk for hepatitis B, you should be screened for this virus. You are considered at high risk for hepatitis B if: ? You were born in a country where hepatitis B is common. Ask your health care provider which countries are considered high risk. ? Your parents were born in a high-risk country, and you have not been immunized against hepatitis B (hepatitis B vaccine). ? You have HIV or AIDS. ? You use needles to inject street drugs. ? You live with someone who has hepatitis B. ? You have had sex with someone who has hepatitis B. ? You get hemodialysis treatment. ? You take certain medicines for conditions, including cancer, organ transplantation, and autoimmune conditions.  Hepatitis C  Blood testing is recommended for: ? Everyone born from 1945 through 1965. ? Anyone with known risk factors for hepatitis C.  Sexually transmitted infections (STIs)  You should be screened for sexually transmitted infections (STIs) including gonorrhea and chlamydia if: ? You are sexually active and are younger than 47 years of age. ? You are older than 47 years of age and your health care provider tells you that you are at risk for this type of infection. ? Your sexual  activity has changed since you were last screened and you are at an increased risk for chlamydia or gonorrhea. Ask your health care provider if you are at risk.  If you do not have HIV, but are at risk, it may be recommended that you take a prescription medicine daily to prevent HIV infection. This is called pre-exposure prophylaxis (PrEP). You are considered at risk if: ? You are sexually active and do not regularly use condoms or know the HIV status of your partner(s). ? You take drugs by injection. ?   injection. ? You are sexually active with a partner who has HIV.  Talk with your health care provider about whether you are at high risk of being infected with HIV. If you choose to begin PrEP, you should first be tested for HIV. You should then be tested every 3 months for as long as you are taking PrEP. Pregnancy  If you are premenopausal and you may become pregnant, ask your health care provider about preconception counseling.  If you may become pregnant, take 400 to 800 micrograms (mcg) of folic acid every day.  If you want to prevent pregnancy, talk to your health care provider about birth control (contraception). Osteoporosis and menopause  Osteoporosis is a disease in which the bones lose minerals and strength with aging. This can result in serious bone fractures. Your risk for osteoporosis can be identified using a bone density scan.  If you are 94 years of age or older, or if you are at risk for osteoporosis and fractures, ask your health care provider if you should be screened.  Ask your health care provider whether you should take a calcium or vitamin D supplement to lower your risk for osteoporosis.  Menopause may have certain physical symptoms and risks.  Hormone replacement therapy may reduce some of these symptoms and risks. Talk to your health care provider about whether hormone replacement therapy is right for you. Follow these instructions at home:  Schedule regular health, dental,  and eye exams.  Stay current with your immunizations.  Do not use any tobacco products including cigarettes, chewing tobacco, or electronic cigarettes.  If you are pregnant, do not drink alcohol.  If you are breastfeeding, limit how much and how often you drink alcohol.  Limit alcohol intake to no more than 1 drink per day for nonpregnant women. One drink equals 12 ounces of beer, 5 ounces of wine, or 1 ounces of hard liquor.  Do not use street drugs.  Do not share needles.  Ask your health care provider for help if you need support or information about quitting drugs.  Tell your health care provider if you often feel depressed.  Tell your health care provider if you have ever been abused or do not feel safe at home. This information is not intended to replace advice given to you by your health care provider. Make sure you discuss any questions you have with your health care provider. Document Released: 08/02/2010 Document Revised: 06/25/2015 Document Reviewed: 10/21/2014 Elsevier Interactive Patient Education  [employer].

## 2016-11-14 ENCOUNTER — Ambulatory Visit: Payer: Self-pay | Admitting: Adult Health

## 2016-11-14 ENCOUNTER — Encounter: Payer: Self-pay | Admitting: Adult Health

## 2016-11-14 ENCOUNTER — Ambulatory Visit (INDEPENDENT_AMBULATORY_CARE_PROVIDER_SITE_OTHER): Payer: 59 | Admitting: Adult Health

## 2016-11-14 VITALS — BP 126/78 | HR 86 | Temp 97.5°F | Ht 61.0 in | Wt 206.4 lb

## 2016-11-14 DIAGNOSIS — M79645 Pain in left finger(s): Secondary | ICD-10-CM

## 2016-11-14 DIAGNOSIS — M25542 Pain in joints of left hand: Secondary | ICD-10-CM

## 2016-11-14 NOTE — Progress Notes (Signed)
Assessment and Plan:  Samantha Greene was seen today for finger injury.  Diagnoses and all orders for this visit:  Pain in thumb joint with movement of left hand -     DG Hand Complete Left; Future -     Apply a thumb immobilizing splint immediately -     Alternate tylenol/ibuprofen for pain -     Apply ice for next 24 hours then switch to heat  ROM exam limited by exquisite pain; pain localized to L thumb DIP joint with palpation and movement without palpable bony deformity. Will obtain XR- nondisplaced fracture vs ligament strain; patient will follow up as needed in 1 week or so pending XR results, discussed possible ortho referral if joint laxity is present at that point.   Further disposition pending results of labs. Discussed med's effects and SE's.   Over 15 minutes of exam, counseling, chart review, and critical decision making was performed.   Future Appointments Date Time Provider Department Center  02/22/2017 10:00 AM Quentin Mulling, PA-C [facility] None    ------------------------------------------------------------------------------------------------------------------   HPI BP 126/78   Pulse 86   Temp (!) 97.5 F (36.4 C)   Ht  (1.549 m)   Wt 206 lb 6.4 oz (93.6 kg)   LMP 10/28/2016   SpO2 93%   BMI 39.00 kg/m   46 y.o.female presents for L thumb pain post injury. She reports her hand became tangled in her dog leash then jammed between car seats yesterday while at the beach. She did not apply ice or any other interventions yesterday, and woke up with generalized throbbing of the digit at baseline, "not really pain" - 10/10 sharp pain at DIP joint of L thumb with attempted ROM.   Past Medical History:  Diagnosis Date  . Anxiety   . Depression    no meds  . Hyperlipidemia   . Hypertension    no meds     Allergies  Allergen Reactions  . Erythromycin Nausea Only and Other (See Comments)    Stomach cramping (can take Z Pak)  . Flexeril [Cyclobenzaprine Hcl]  Other (See Comments)    "don't like the way I feel"  . Metformin And Related     GI Upset    Current Outpatient Prescriptions on File Prior to Visit  Medication Sig  . albuterol (VENTOLIN HFA) 108 (90 Base) MCG/ACT inhaler Inhale 2 puffs into the lungs every 4 (four) hours as needed for wheezing or shortness of breath.  Marland Kitchen aspirin 81 MG tablet Take 81 mg by mouth daily.  . baclofen (LIORESAL) 10 MG tablet Take 1 tablet (10 mg total) by mouth 2 (two) times daily. Take 1/2 to 1 tablet 2 x day if needed for muscle spasm  . buPROPion (WELLBUTRIN XL) 150 MG 24 hr tablet Take 1 tablet (150 mg total) by mouth every morning.  . Cholecalciferol (VITAMIN D) 2000 UNITS tablet Take 2,000 Units by mouth daily. Pt takes [employer] [facility]  . Cyanocobalamin (VITAMIN B 12) 100 MCG LOZG Take by mouth.  Marland Kitchen ipratropium (ATROVENT) 0.03 % nasal spray PLACE 2 SPRAYS INTO THE NOSE 3 (THREE) TIMES DAILY.  . magnesium 30 MG tablet Take 30 mg by mouth 2 (two) times daily. Takes twice a week  . meloxicam (MOBIC) 15 MG tablet Take 1 tablet (15 mg total) by mouth daily.  . montelukast (SINGULAIR) 10 MG tablet TAKE 1 TABLET BY MOUTH EVERY DAY  . Norethindrone Acetate-Ethinyl Estrad-FE (LOESTRIN 24 FE) 1-20 MG-MCG(24) tablet Take 1  tablet by mouth daily.  Marland Kitchen omeprazole (PRILOSEC) 40 MG capsule TAKE 1 CAPSULE BY MOUTH DAILY  . OVER THE COUNTER MEDICATION Patient takes 1 gummy multivitamin daily.  Marland Kitchen OVER THE COUNTER MEDICATION Takes OTC Zyrtec 10 mg PRN.  . pravastatin (PRAVACHOL) 40 MG tablet TAKE 1 TABLET BY MOUTH EVERY DAY  . promethazine-dextromethorphan (PROMETHAZINE-DM) 6.25-15 MG/5ML syrup Take 5 mLs by mouth 4 (four) times daily as needed for cough.  . ranitidine (ZANTAC) 300 MG tablet Take 1/2-1 tab as needed at night.  . sertraline (ZOLOFT) 100 MG tablet TAKE 1 TABLET BY MOUTH EVERY DAY  . traZODone (DESYREL) 100 MG tablet TAKE 1 TABLET (100 MG TOTAL) BY MOUTH AT BEDTIME.  . valsartan (DIOVAN) 80 MG  tablet TAKE 1/2 TO 1 TABLET BY MOUTH DAILY FOR BLOOD PRESSURE  . fluticasone (FLONASE) 50 MCG/ACT nasal spray Place 2 sprays into both nostrils daily.   No current facility-administered medications on file prior to visit.     ROS: all negative except above.   Physical Exam:  BP 126/78   Pulse 86   Temp (!) 97.5 F (36.4 C)   Ht  (1.549 m)   Wt 206 lb 6.4 oz (93.6 kg)   LMP 10/28/2016   SpO2 93%   BMI 39.00 kg/m   General Appearance: Well nourished, in no apparent distress. Respiratory: Respiratory effort normal, BS equal bilaterally without rales, rhonchi, wheezing or stridor.  Cardio: RRR with no MRGs. Pulses equal BUE.  Abdomen: Soft, + BS.  Lymphatics: Non tender without lymphadenopathy.  Musculoskeletal: Full ROM, 5/5 strength, normal gait. Ecchymosis to L thumb DIP joint, thumb with generalized swelling, no palpable bony deformity through thumb joints/anatomical snuff box, localized tenderness to DIP joint with palpation. ROM exam limited by swelling and exquisite pain. No obvious displacement; joints remain within expected anatomical alignment.  Skin: Warm, dry. Neuro: Normal muscle tone, no cerebellar symptoms. Sensation intact.  Psych: Awake and oriented X 3, normal affect, Insight and Judgment appropriate.     Dan Maker, NP 5:10 PM [facility]

## 2016-11-14 NOTE — Patient Instructions (Signed)
Alternate ibuprofen/tylenol, apply ice next 24 hours, then switch to heat.   Buy a thumb splint and apply ASAP.    Thumb Fracture A thumb fracture is a break in one of the two bones of your thumb. The thumb bone that goes from the tip of your thumb to the first joint in your thumb is called the distal phalanx. The thumb bone that goes from the first joint to the joint at the base of your thumb is called the proximal phalanx. Breaks that occur at the joints of your thumb are harder to treat. A broken thumb is more serious than a break in one of your other fingers because you need your thumb for grasping. Thumb fractures are also more likely to lead to pain and stiffness years after healing (arthritis). What are the causes? Thumb fractures may be caused by:  A direct blow to your thumb.  Stress on your thumb from it being pulled out of place.  These types of injuries often happen as a result of:  Car accidents.  Bicycle accidents.  Falling with your hand outstretched.  Participating in sports such as wrestling, hockey, football, or skiing.  What increases the risk? You may be more likely to break your thumb if you have a condition that causes your bones to become thin and brittle (osteoporosis). What are the signs or symptoms? The most common symptom is severe pain at the fracture site. Other signs and symptoms may include:  Swelling.  Bruising.  Not being able to move the thumb.  An abnormal shape of the thumb (deformity).  Numbness or coldness.  A red, black, or blue thumbnail.  How is this diagnosed? Your health care provider may suspect a thumb fracture if you recently injured your thumb and have signs and symptoms of a fracture. An X-ray of your thumb may be done to confirm the diagnosis and determine how bad the break is. How is this treated? A thumb fracture should be treated as soon as possible. You may need to wear a padded splint to keep your thumb from moving and  to protect your thumb until you can get a cast or have surgery. Treatment options include:  Immobilization. ? A cast or splint is put on the injured area without changing the position of the broken bone. ? You may have to wear a type of cast called a spica cast or hitchhiker cast to hold the thumb in the proper position. ? A cast is usually left on for 4?6 weeks.  Closed reduction. ? In this procedure, the bones are put back into position without surgery.  Open reduction and internal fixation (ORIF). This is a surgical procedure. ? First, the fracture site is opened up. ? Then, the bone pieces are fixed into place with metal screws, plates, or wires.  External fixation. ? In this type of open reduction, the fracture is held in place by metal pins. ? The pins are attached to a stabilizing bar outside your skin.  You may need to wear a cast after surgery for up to 6 weeks.  You may need to return for X-rays to make sure your thumb is healing properly.  After your cast is taken off, you may need to do hand exercises (physical therapy) to get movement back in your thumb.  It may take another 3 months to regain complete use of your thumb.  Follow these instructions at home:  Take medicines only as directed by your health care provider.  Keep your hand elevated above the level of your heart when resting.  Keep your cast dry when bathing. Cover it with a plastic bag as directed by your health care provider.  After your cast is removed, exercise your thumb at home. Your health care provider may suggest that you: ? Move your thumb in circles. ? Touch your thumb to your little finger. ? Do these exercises several times a day.  Ask your health care provider whether you can use a hand exerciser to strengthen your muscles.  If your thumb feels stiff while you are exercising it, try doing the exercises while soaking your hand in warm water.  Keep all follow-up visits as directed by your  health care provider. This is important. Contact a health care provider if:  You have more than a small spot of bleeding from under your cast or splint.  Your pain medicine is not helping.  You have a fever.  You have numbness or tingling in the injured area.  Your cast becomes loose or damaged.  You notice a bad odor or discharge coming from under your cast. Get help right away if:  You have pain that is very bad or getting worse.  You lose feeling in your thumb.  Your thumb turns pale or blue.  Your thumb feels cold.  You have drainage, redness, or swelling at the injury site. This information is not intended to replace advice given to you by your health care provider. Make sure you discuss any questions you have with your health care provider. Document Released: 10/16/2002 Document Revised: 09/20/2015 Document Reviewed: 03/22/2013 Elsevier Interactive Patient Education  [employer].

## 2016-11-16 ENCOUNTER — Encounter: Payer: Self-pay | Admitting: Obstetrics & Gynecology

## 2016-11-16 ENCOUNTER — Encounter: Payer: Self-pay | Admitting: Adult Health

## 2016-11-17 MED ORDER — NORGESTIMATE-ETH ESTRADIOL 0.25-35 MG-MCG PO TABS: 1.0000 | ORAL_TABLET | Freq: Every day | ORAL | 3 refills | Status: DC

## 2016-11-18 ENCOUNTER — Other Ambulatory Visit: Payer: Self-pay | Admitting: Adult Health

## 2016-11-18 ENCOUNTER — Ambulatory Visit (HOSPITAL_COMMUNITY)
Admission: RE | Admit: 2016-11-18 | Discharge: 2016-11-18 | Disposition: A | Discharge status: Home / Self Care | Payer: 59 | Source: Ambulatory Visit | Attending: Adult Health | Admitting: Adult Health

## 2016-11-18 DIAGNOSIS — M79645 Pain in left finger(s): Secondary | ICD-10-CM | POA: Diagnosis not present

## 2016-11-18 DIAGNOSIS — X58XXXA Exposure to other specified factors, initial encounter: Secondary | ICD-10-CM | POA: Insufficient documentation

## 2016-11-18 DIAGNOSIS — S62522A Displaced fracture of distal phalanx of left thumb, initial encounter for closed fracture: Secondary | ICD-10-CM | POA: Insufficient documentation

## 2016-11-18 DIAGNOSIS — S62515A Nondisplaced fracture of proximal phalanx of left thumb, initial encounter for closed fracture: Secondary | ICD-10-CM | POA: Diagnosis not present

## 2016-11-18 DIAGNOSIS — S62525A Nondisplaced fracture of distal phalanx of left thumb, initial encounter for closed fracture: Secondary | ICD-10-CM

## 2016-11-18 DIAGNOSIS — M25542 Pain in joints of left hand: Secondary | ICD-10-CM

## 2016-11-18 NOTE — Progress Notes (Signed)
Nondisplaced fracture at radial aspect base of distal phalanx LEFT thumb, extensive intra-articular at the radial margin of the IP Joint noted on XR. Reemphasized for the patient to keep the thumb immobilized; will provide an orthopedics referral.

## 2016-11-25 ENCOUNTER — Ambulatory Visit (INDEPENDENT_AMBULATORY_CARE_PROVIDER_SITE_OTHER): Payer: 59 | Admitting: Orthopaedic Surgery

## 2016-11-25 DIAGNOSIS — S62525A Nondisplaced fracture of distal phalanx of left thumb, initial encounter for closed fracture: Secondary | ICD-10-CM | POA: Insufficient documentation
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
## 2016-11-27 ENCOUNTER — Other Ambulatory Visit: Payer: Self-pay | Admitting: Physician Assistant

## 2016-11-28 ENCOUNTER — Other Ambulatory Visit: Payer: Self-pay | Admitting: Physician Assistant

## 2017-01-18 ENCOUNTER — Telehealth: Payer: Self-pay | Admitting: Physician Assistant

## 2017-01-18 ENCOUNTER — Other Ambulatory Visit: Payer: Self-pay | Admitting: Physician Assistant

## 2017-01-18 MED ORDER — TELMISARTAN 40 MG PO TABS: 40.0000 mg | ORAL_TABLET | Freq: Every day | ORAL | 1 refills | Status: DC

## 2017-01-18 NOTE — Telephone Encounter (Signed)
LVM for call in order to inform pt of recall

## 2017-01-18 NOTE — Telephone Encounter (Signed)
Patient on 80mg  of diovan, due to recall will send in micardis 40mg  Stop diovan and start new medication

## 2017-01-20 NOTE — Telephone Encounter (Signed)
Pt has been made aware Dec 21th 2018 by DD

## 2017-01-20 NOTE — Telephone Encounter (Signed)
Lvm for return call

## 2017-02-21 DIAGNOSIS — L602 Onychogryphosis: Secondary | ICD-10-CM | POA: Diagnosis not present

## 2017-02-21 DIAGNOSIS — M79672 Pain in left foot: Secondary | ICD-10-CM | POA: Diagnosis not present

## 2017-02-21 DIAGNOSIS — M722 Plantar fascial fibromatosis: Secondary | ICD-10-CM | POA: Diagnosis not present

## 2017-02-21 NOTE — Progress Notes (Signed)
Complete Physical  Assessment and Plan: Essential hypertension - continue medications, DASH diet, exercise and monitor at home. Call if greater than 130/80.  -     CBC with Differential/Platelet -     BASIC METABOLIC PANEL WITH GFR -     Hepatic function panel -     TSH -     Urinalysis, Routine w reflex microscopic -     Microalbumin / creatinine urine ratio -     EKG 12-Lead  Mild episode of recurrent major depressive disorder (HCC) - will increase to wellbutrin, stress management techniques discussed, increase water, good sleep hygiene discussed, increase exercise, and increase veggies.  -     Ambulatory referral to Psychiatry -     buPROPion (WELLBUTRIN XL) 300 MG 24 hr tablet; Take 1 tablet (300 mg total) by mouth every morning.  Mixed hyperlipidemia -continue medications, check lipids, decrease fatty foods, increase activity.  -     Lipid panel  Anxiety Continue medications  Insomnia, unspecified type Increase trazodone to 1.5, may need to add on med for RLS  Vitamin D deficiency -     VITAMIN D 25 Hydroxy (Vit-D Deficiency, Fractures)  Class 1 obesity due to excess calories with serious comorbidity in adult, unspecified BMI - follow up 3 months for progress monitoring - increase veggies, decrease carbs - long discussion about weight loss, diet, and exercise  Medication management -     Magnesium  Encounter for general adult medical examination with abnormal findings 1 year, get MGM  BMI 39.0-39.9,adult - follow up 3 months for progress monitoring - increase veggies, decrease carbs - long discussion about weight loss, diet, and exercise  Psoriasis, guttate -     triamcinolone ointment (KENALOG) 0.1 %; Apply 1 application topically 2 (two) times daily.  Screening, anemia, deficiency, iron -     Iron,Total/Total Iron Binding Cap  Hypertrophy of breast Will send to PT for back pain, treat yeast, work on weight loss May benefit from breast reduction in the  future.   Chronic midline thoracic back pain Lower back pain - negative straight leg NSAIDs, RICE, and exercise given If not better follow up in office or will refer to PT/orthopedics. Natural history and expected course discussed. Questions answered. Stretching exercises discussed. Heat to affected area as needed for local pain relief. -     Ambulatory referral to Physical Therapy  Yeast dermatitis -     triamcinolone ointment (KENALOG) 0.1 %; Apply 1 application topically 2 (two) times daily. -     nystatin cream (MYCOSTATIN); Apply 1 application topically 2 (two) times daily.   Discussed med's effects and SE's. Screening labs and tests as requested with regular follow-up as recommended. Future Appointments  Date Time Provider Department Center  05/24/2017  2:30 PM Quentin MullingCollier, Ramaya Guile, PA-C [facility] None  03/06/2018 10:00 AM Quentin Mullingollier, Ciearra Rufo, PA-C [facility] None    HPI  47 y.o. female  presents for a complete physical.  She is on zoloft 100 and wellbutrin 150 XL, she states her depression has been worse last several months x Nov. She states she visits her husbands mom, and other than that does not work have any anything to do and feels isolated and bored. She is on trazodone and zyrtec for sleep study states she is still having trouble sleeping. She does feel that she has restless legs.   She has thoracic back pain and large breast, she also has indentations from her bra and yeast under her breast. Baclofen is not  helping.    Her blood pressure has been controlled at home, today their BP is BP: 126/88.  She does workout. She denies chest pain, shortness of breath, dizziness.   She is on cholesterol medication and denies myalgias. Her cholesterol is at goal. The cholesterol last visit was:  Lab Results  Component Value Date   CHOL 200 (H) 10/25/2016   HDL 60 10/25/2016   LDLCALC 109 (H) 06/22/2016   TRIG 213 (H) 10/25/2016   CHOLHDL 3.3 10/25/2016  . She has been working on diet  and exercise for prediabetes, she is on bASA, she is on ACE/ARB and denies foot ulcerations, hyperglycemia, hypoglycemia , increased appetite, nausea, paresthesia of the feet, polydipsia, polyuria, visual disturbances, vomiting and weight loss. Last A1C in the office was:  Lab Results  Component Value Date   HGBA1C 5.4 06/22/2016   Patient is on Vitamin D supplement.   Lab Results  Component Value Date   VD25OH 47 10/25/2016     BMI is Body mass index is 39.19 kg/m., she is working on diet and exercise. Wt Readings from Last 3 Encounters:  02/22/17 207 lb 6.4 oz (94.1 kg)  11/14/16 206 lb 6.4 oz (93.6 kg)  11/09/16 205 lb (93 kg)   She has recurrent sinus injections, going to call Dr. Ezzard Standing to try get nasal surgery.   Current Medications:  Current Outpatient Medications on File Prior to Visit  Medication Sig Dispense Refill  . albuterol (VENTOLIN HFA) 108 (90 Base) MCG/ACT inhaler Inhale 2 puffs into the lungs every 4 (four) hours as needed for wheezing or shortness of breath. 1 Inhaler 0  . aspirin 81 MG tablet Take 81 mg by mouth daily.    . baclofen (LIORESAL) 10 MG tablet Take 1 tablet (10 mg total) by mouth 2 (two) times daily. Take 1/2 to 1 tablet 2 x day if needed for muscle spasm 60 tablet 1  . BIOTIN PO Take by mouth.    . Cholecalciferol (VITAMIN D) 2000 UNITS tablet Take 2,000 Units by mouth daily. Pt takes [employer]    . Cyanocobalamin (VITAMIN B 12) 100 MCG LOZG Take by mouth.    Marland Kitchen ipratropium (ATROVENT) 0.03 % nasal spray PLACE 2 SPRAYS INTO THE NOSE 3 (THREE) TIMES DAILY. 90 mL 1  . magnesium 30 MG tablet Take 30 mg by mouth 2 (two) times daily. Takes twice a week    . meloxicam (MOBIC) 15 MG tablet Take 1 tablet (15 mg total) by mouth daily. 90 tablet 0  . montelukast (SINGULAIR) 10 MG tablet TAKE 1 TABLET BY MOUTH EVERY DAY 90 tablet 3  . norgestimate-ethinyl estradiol (SPRINTEC 28) 0.25-35 MG-MCG tablet Take 1 tablet by mouth daily. 3 Package 3   . omeprazole (PRILOSEC) 40 MG capsule TAKE 1 CAPSULE BY MOUTH DAILY 90 capsule 1  . OVER THE COUNTER MEDICATION Patient takes 1 gummy multivitamin daily.    Marland Kitchen OVER THE COUNTER MEDICATION Takes OTC Zyrtec 10 mg PRN.    . pravastatin (PRAVACHOL) 40 MG tablet TAKE 1 TABLET BY MOUTH EVERY DAY 90 tablet 1  . promethazine-dextromethorphan (PROMETHAZINE-DM) 6.25-15 MG/5ML syrup Take 5 mLs by mouth 4 (four) times daily as needed for cough. 240 mL 1  . ranitidine (ZANTAC) 300 MG tablet Take 1/2-1 tab as needed at night. 30 tablet 2  . sertraline (ZOLOFT) 100 MG tablet TAKE 1 TABLET BY MOUTH EVERY DAY 90 tablet 1  . telmisartan (MICARDIS) 40 MG tablet Take 1 tablet (40  mg total) by mouth daily. 90 tablet 1  . traZODone (DESYREL) 100 MG tablet TAKE 1 TABLET (100 MG TOTAL) BY MOUTH AT BEDTIME. 90 tablet 1  . fluticasone (FLONASE) 50 MCG/ACT nasal spray Place 2 sprays into both nostrils daily. 16 g 2   No current facility-administered medications on file prior to visit.     Health Maintenance:   Immunization History  Administered Date(s) Administered  . Influenza Inj Mdck Quad With Preservative 10/25/2016  . Influenza, Seasonal, Injecte, Preservative Fre 01/01/2015  . Influenza,inj,Quad PF,6+ Mos 10/25/2012  . PPD Test 04/11/2013  . Pneumococcal Polysaccharide-23 03/28/2012  . Tdap 12/12/2012   Tetanus: 2014 Pneumovax: 2014 Flu vaccine: 2018  Pap: Due with Levar July MGM: Dr. Carman Ching 08/2015  Last Dental Exam: Yearly visit  Patient Care Team: Lucky Cowboy, MD as PCP - General (Internal Medicine) Antionette Char, MD as Consulting Physician (Obstetrics and Gynecology) Cindee Salt, MD as Consulting Physician (Orthopedic Surgery)  Medical History:  Past Medical History:  Diagnosis Date  . Anxiety   . Depression    no meds  . Hyperlipidemia   . Hypertension    no meds   Allergies Allergies  Allergen Reactions  . Erythromycin Nausea Only and Other (See Comments)    Stomach  cramping (can take Z Pak)  . Flexeril [Cyclobenzaprine Hcl] Other (See Comments)    "don't like the way I feel"  . Metformin And Related     GI Upset    SURGICAL HISTORY She  has a past surgical history that includes Dilation and evacuation and Dilation and curettage of uterus.   FAMILY HISTORY Her family history includes Breast cancer in her maternal aunt and mother; Colon cancer in her maternal uncle; Congestive Heart Failure in her mother; Diabetes in her brother, maternal aunt, maternal uncle, and mother; Heart disease in her maternal aunt, maternal grandmother, and maternal uncle; Hemochromatosis in her paternal uncle; Hyperlipidemia in her brother, maternal aunt, maternal uncle, and mother; Hypertension in her brother, maternal aunt, maternal uncle, and mother; Kidney disease in her mother; Lung cancer in her mother; Polycystic ovary syndrome in her paternal aunt; Prostate cancer in her unknown relative; Stroke in her maternal aunt and maternal uncle.   SOCIAL HISTORY She  reports that  has never smoked. she has never used smokeless tobacco. She reports that she does not drink alcohol or use drugs.  Review of Systems: Review of Systems  Constitutional: Negative for chills, fever and malaise/fatigue.  HENT: Negative for congestion, ear pain and sore throat.   Eyes: Negative.   Respiratory: Negative for cough, shortness of breath and wheezing.   Cardiovascular: Negative for chest pain, palpitations and leg swelling.  Gastrointestinal: Negative for abdominal pain, blood in stool, constipation, diarrhea, heartburn and melena.  Genitourinary: Negative for dysuria, frequency, hematuria and urgency.  Neurological: Negative for dizziness, loss of consciousness and headaches.  Psychiatric/Behavioral: Negative for depression. The patient is not nervous/anxious and does not have insomnia.     Physical Exam: Estimated body mass index is 39.19 kg/m as calculated from the following:   Height  as of this encounter: 5\' 1"  (1.549 m).   Weight as of this encounter: 207 lb 6.4 oz (94.1 kg). BP 126/88   Pulse 87   Temp (!) 97.5 F (36.4 C)   Resp 16   Ht 5\' 1"  (1.549 m)   Wt 207 lb 6.4 oz (94.1 kg)   SpO2 97%   BMI 39.19 kg/m   General Appearance: Well nourished well  developed, in no apparent distress.  Eyes: PERRLA, EOMs, conjunctiva no swelling or erythema ENT/Mouth: Ear canals normal without obstruction, swelling, erythema, or discharge.  TMs normal bilaterally with no erythema, bulging, retraction, or loss of landmark.  Oropharynx moist and clear with no exudate, erythema, or swelling.  Neck: Supple, thyroid normal. No bruits.  No cervical adenopathy Respiratory: Respiratory effort normal, Breath sounds clear A&P without wheeze, rhonchi, rales.   Cardio: RRR without murmurs, rubs or gallops. Brisk peripheral pulses without edema.  Chest: symmetric, with normal excursions Breasts: Symmetric, without lumps, nipple discharge, retractions.  Abdomen: Soft, nontender, no guarding, rebound, hernias, masses, or organomegaly.  Lymphatics: Non tender without lymphadenopathy.  Musculoskeletal: Full ROM all peripheral extremities,5/5 strength, and normal gait.  Skin: Warm, dry without rashes, lesions, ecchymosis. Neuro: Awake and oriented X 3, Cranial nerves intact, reflexes equal bilaterally. Normal muscle tone, no cerebellar symptoms. Sensation intact.  Psych:  normal affect, Insight and Judgment appropriate.   EKG: WNL no changes.  Over 40 minutes of exam, counseling, chart review and critical decision making was performed  Quentin Mulling 11:25 AM [facility]

## 2017-02-22 ENCOUNTER — Encounter: Payer: Self-pay | Admitting: Physician Assistant

## 2017-02-22 ENCOUNTER — Ambulatory Visit: Payer: 59 | Admitting: Physician Assistant

## 2017-02-22 VITALS — BP 126/88 | HR 87 | Temp 97.5°F | Resp 16 | Ht 61.0 in | Wt 207.4 lb

## 2017-02-22 DIAGNOSIS — I10 Essential (primary) hypertension: Secondary | ICD-10-CM | POA: Diagnosis not present

## 2017-02-22 DIAGNOSIS — Z130 Encounter for screening for diseases of the blood and blood-forming organs and certain disorders involving the immune mechanism: Secondary | ICD-10-CM

## 2017-02-22 DIAGNOSIS — E782 Mixed hyperlipidemia: Secondary | ICD-10-CM | POA: Diagnosis not present

## 2017-02-22 DIAGNOSIS — M546 Pain in thoracic spine: Secondary | ICD-10-CM

## 2017-02-22 DIAGNOSIS — Z136 Encounter for screening for cardiovascular disorders: Secondary | ICD-10-CM

## 2017-02-22 DIAGNOSIS — Z0000 Encounter for general adult medical examination without abnormal findings: Secondary | ICD-10-CM

## 2017-02-22 DIAGNOSIS — Z79899 Other long term (current) drug therapy: Secondary | ICD-10-CM

## 2017-02-22 DIAGNOSIS — Z6839 Body mass index (BMI) 39.0-39.9, adult: Secondary | ICD-10-CM

## 2017-02-22 DIAGNOSIS — E559 Vitamin D deficiency, unspecified: Secondary | ICD-10-CM

## 2017-02-22 DIAGNOSIS — E6609 Other obesity due to excess calories: Secondary | ICD-10-CM

## 2017-02-22 DIAGNOSIS — G8929 Other chronic pain: Secondary | ICD-10-CM

## 2017-02-22 DIAGNOSIS — B372 Candidiasis of skin and nail: Secondary | ICD-10-CM

## 2017-02-22 DIAGNOSIS — N62 Hypertrophy of breast: Secondary | ICD-10-CM

## 2017-02-22 DIAGNOSIS — L404 Guttate psoriasis: Secondary | ICD-10-CM | POA: Insufficient documentation

## 2017-02-22 DIAGNOSIS — G4700 Insomnia, unspecified: Secondary | ICD-10-CM

## 2017-02-22 DIAGNOSIS — F330 Major depressive disorder, recurrent, mild: Secondary | ICD-10-CM

## 2017-02-22 DIAGNOSIS — Z0001 Encounter for general adult medical examination with abnormal findings: Secondary | ICD-10-CM

## 2017-02-22 DIAGNOSIS — F419 Anxiety disorder, unspecified: Secondary | ICD-10-CM

## 2017-02-22 MED ORDER — BUPROPION HCL ER (XL) 300 MG PO TB24: 300.0000 mg | ORAL_TABLET | Freq: Every morning | ORAL | 6 refills | Status: DC

## 2017-02-22 MED ORDER — TRIAMCINOLONE ACETONIDE 0.1 % EX OINT: 1.0000 "application " | TOPICAL_OINTMENT | Freq: Two times a day (BID) | CUTANEOUS | 1 refills | Status: DC

## 2017-02-22 MED ORDER — NYSTATIN 100000 UNIT/GM EX CREA: 1.0000 "application " | TOPICAL_CREAM | Freq: Two times a day (BID) | CUTANEOUS | 1 refills | Status: DC

## 2017-02-22 NOTE — Patient Instructions (Addendum)
Check up Meetup groups in [facility]  Increase wellbutrin to 300mg  daily with the zoloft 100mg  or 50mg   Try trazodone 1.5 or 150mg  of trazodone at night with zyrtec at night and see how you do.   Use triamcinolone ointment and nystatin cream together under your breast twice a day for 1 month  Major Depressive Disorder, Adult Major depressive disorder (MDD) is a mental health condition. MDD often makes you feel sad, hopeless, or helpless. MDD can also cause symptoms in your body. MDD can affect your:  Work.  School.  Relationships.  Other normal activities.  MDD can range from mild to very bad. It may occur once (single episode MDD). It can also occur many times (recurrent MDD). The main symptoms of MDD often include:  Feeling sad, depressed, or irritable most of the time.  Loss of interest.  MDD symptoms also include:  Sleeping too much or too little.  Eating too much or too little.  A change in your weight.  Feeling tired (fatigue) or having low energy.  Feeling worthless.  Feeling guilty.  Trouble making decisions.  Trouble thinking clearly.  Thoughts of suicide or harming others.  Feeling weak.  Feeling agitated.  Keeping yourself from being around other people (isolation).  Follow these instructions at home: Activity  Do these things as told by your doctor: ? Go back to your normal activities. ? Exercise regularly. ? Spend time outdoors. Alcohol  Talk with your doctor about how alcohol can affect your antidepressant medicines.  Do not drink alcohol. Or, limit how much alcohol you drink. ? This means no more than 1 drink a day for nonpregnant women and 2 drinks a day for men. One drink equals one of these:  12 oz of beer.  5 oz of wine.  1 oz of hard liquor. General instructions  Take over-the-counter and prescription medicines only as told by your doctor.  Eat a healthy diet.  Get plenty of sleep.  Find activities that you enjoy.  Make time to do them.  Think about joining a support group. Your doctor may be able to suggest a group for you.  Keep all follow-up visits as told by your doctor. This is important. Where to find more information:  [employer] on Mental Illness: ? www.nami.org  [place] [employer] of Mental Health: ? http://www.maynard.net/  National Suicide Prevention Lifeline: ? (864) 467-3460. This is free, 24-hour help. Contact a doctor if:  Your symptoms get worse.  You have new symptoms. Get help right away if:  You self-harm.  You see, hear, taste, smell, or feel things that are not present (hallucinate). If you ever feel like you may hurt yourself or others, or have thoughts about taking your own life, get help right away. You can go to your nearest emergency department or call:  Your local emergency services (911 in the [place]  A suicide crisis helpline, such as the National Suicide Prevention Lifeline: ? 865-541-3745. This is open 24 hours a day.  This information is not intended to replace advice given to you by your health care provider. Make sure you discuss any questions you have with your health care provider. Document Released: 12/29/2014 Document Revised: 10/04/2015 Document Reviewed: 10/04/2015 Elsevier Interactive Patient Education  2017 [employer].    Try the exercises and other information in the back care manual, meloxicam once during the day as needed (avoid taking other NSAIDS like Alleve or Ibuprofen while taking this) and then flexeril if needed at bedtime for muscle  spasm. This can be taken up to every 8 hours, but causes sedation, so should not drive or operate heavy machinery while taking this medicine.   Go to the ER if you have any new weakness in your legs, have trouble controlling your urine or bowels, or have worsening pain.   If you are not better in 1-3 month we will refer you to ortho   Back pain Rehab Ask your health care provider which  exercises are safe for you. Do exercises exactly as told by your health care provider and adjust them as directed. It is normal to feel mild stretching, pulling, tightness, or discomfort as you do these exercises, but you should stop right away if you feel sudden pain or your pain gets worse.Do not begin these exercises until told by your health care provider. Stretching and range of motion exercises These exercises warm up your muscles and joints and improve the movement and flexibility of your hips and your back. These exercises also help to relieve pain, numbness, and tingling. Exercise A: Sciatic nerve glide 1. Sit in a chair with your head facing down toward your chest. Place your hands behind your back. Let your shoulders slump forward. 2. Slowly straighten one of your knees while you tilt your head back as if you are looking toward the ceiling. Only straighten your leg as far as you can without making your symptoms worse. 3. Hold for __________ seconds. 4. Slowly return to the starting position. 5. Repeat with your other leg. Repeat __________ times. Complete this exercise __________ times a day. Exercise B: Knee to chest with hip adduction and internal rotation  1. Lie on your back on a firm surface with both legs straight. 2. Bend one of your knees and move it up toward your chest until you feel a gentle stretch in your lower back and buttock. Then, move your knee toward the shoulder that is on the opposite side from your leg. ? Hold your leg in this position by holding onto the front of your knee. 3. Hold for __________ seconds. 4. Slowly return to the starting position. 5. Repeat with your other leg. Repeat __________ times. Complete this exercise __________ times a day. Exercise C: Prone extension on elbows  1. Lie on your abdomen on a firm surface. A bed may be too soft for this exercise. 2. Prop yourself up on your elbows. 3. Use your arms to help lift your chest up until you  feel a gentle stretch in your abdomen and your lower back. ? This will place some of your body weight on your elbows. If this is uncomfortable, try stacking pillows under your chest. ? Your hips should stay down, against the surface that you are lying on. Keep your hip and back muscles relaxed. 4. Hold for __________ seconds. 5. Slowly relax your upper body and return to the starting position. Repeat __________ times. Complete this exercise __________ times a day. Strengthening exercises These exercises build strength and endurance in your back. Endurance is the ability to use your muscles for a long time, even after they get tired. Exercise D: Pelvic tilt 1. Lie on your back on a firm surface. Bend your knees and keep your feet flat. 2. Tense your abdominal muscles. Tip your pelvis up toward the ceiling and flatten your lower back into the floor. ? To help with this exercise, you may place a small towel under your lower back and try to push your back into the towel. 3.  Hold for __________ seconds. 4. Let your muscles relax completely before you repeat this exercise. Repeat __________ times. Complete this exercise __________ times a day. Exercise E: Alternating arm and leg raises  1. Get on your hands and knees on a firm surface. If you are on a hard floor, you may want to use padding to cushion your knees, such as an exercise mat. 2. Line up your arms and legs. Your hands should be below your shoulders, and your knees should be below your hips. 3. Lift your left leg behind you. At the same time, raise your right arm and straighten it in front of you. ? Do not lift your leg higher than your hip. ? Do not lift your arm higher than your shoulder. ? Keep your abdominal and back muscles tight. ? Keep your hips facing the ground. ? Do not arch your back. ? Keep your balance carefully, and do not hold your breath. 4. Hold for __________ seconds. 5. Slowly return to the starting position and  repeat with your right leg and your left arm. Repeat __________ times. Complete this exercise __________ times a day. Posture and body mechanics  Body mechanics refers to the movements and positions of your body while you do your daily activities. Posture is part of body mechanics. Good posture and healthy body mechanics can help to relieve stress in your body's tissues and joints. Good posture means that your spine is in its natural S-curve position (your spine is neutral), your shoulders are pulled back slightly, and your head is not tipped forward. The following are general guidelines for applying improved posture and body mechanics to your everyday activities. Standing   When standing, keep your spine neutral and your feet about hip-width apart. Keep a slight bend in your knees. Your ears, shoulders, and hips should line up.  When you do a task in which you stand in one place for a long time, place one foot up on a stable object that is 2-4 inches (5-10 cm) high, such as a footstool. This helps keep your spine neutral. Sitting   When sitting, keep your spine neutral and keep your feet flat on the floor. Use a footrest, if necessary, and keep your thighs parallel to the floor. Avoid rounding your shoulders, and avoid tilting your head forward.  When working at a desk or a computer, keep your desk at a height where your hands are slightly lower than your elbows. Slide your chair under your desk so you are close enough to maintain good posture.  When working at a computer, place your monitor at a height where you are looking straight ahead and you do not have to tilt your head forward or downward to look at the screen. Resting   When lying down and resting, avoid positions that are most painful for you.  If you have pain with activities such as sitting, bending, stooping, or squatting (flexion-based activities), lie in a position in which your body does not bend very much. For example, avoid  curling up on your side with your arms and knees near your chest (fetal position).  If you have pain with activities such as standing for a long time or reaching with your arms (extension-based activities), lie with your spine in a neutral position and bend your knees slightly. Try the following positions: ? Lying on your side with a pillow between your knees. ? Lying on your back with a pillow under your knees. Lifting   When lifting  objects, keep your feet at least shoulder-width apart and tighten your abdominal muscles.  Bend your knees and hips and keep your spine neutral. It is important to lift using the strength of your legs, not your back. Do not lock your knees straight out.  Always ask for help to lift heavy or awkward objects. This information is not intended to replace advice given to you by your health care provider. Make sure you discuss any questions you have with your health care provider. Document Released: 01/17/2005 Document Revised: 09/24/2015 Document Reviewed: 10/03/2014 Elsevier Interactive Patient Education  2018 [employer].   8 Critical Weight-Loss Tips That Aren't Diet and Exercise  1. STARVE THE DISTRACTIONS  All too often when we eat, we're also multitasking: watching TV, answering emails, scrolling through social media. These habits are detrimental to having a strong, clear, healthy relationship with food, and they can hinder our ability to make dietary changes.  In order to truly focus on what you're eating, how much you're eating, why you're eating those specific foods and, most importantly, how those foods make you feel, you need to starve the distractions. That means when you eat, just eat. Focus on your food, the process it went through to end up on your plate, where it came from and how it nourishes you. With this technique, you're more likely to finish a meal feeling satiated.  2.  CONSIDER WHAT YOU'RE NOT WILLING TO DO  This might sound  counterintuitive, but it can help provide a "why" when motivation is waning. Declare, in writing, what you are unwilling to do, for example "I am unwilling to be the old dad who cannot play sports with my children".  So consider what you're not willing to accept, write it down, and keep it at the ready.  3.  STOP LABELING FOOD "GOOD" AND "BAD"  You've probably heard someone say they ate something "bad." Maybe you've even said it yourself.  The trouble with 'bad' foods isn't that they'll send you to the grave after a bite or two. The trouble comes when we eat excessive portions of really calorie-dense foods meal after meal, day after day.  Instead of labeling foods as good or bad, think about which foods you can eat a lot of, and which ones you should just eat a little of. Then, plan ways to eat the foods you really like in portions that fit with your overall goals. A good example of this would be having a slice of pizza alongside a club salad with chicken breast, avocado and a bit of dressing. This is vastly different than 3 slices of pizza, 4 breadsticks with cheese sauce and half of a liter of regular soda.  4.  BRUSH YOUR TEETH AFTER YOU EAT  Getting your mindset in order is important, but sometimes small habits can make a big difference. After eating, you still have the taste of food in their mouth, which often causes people to eat more even if they are full or engage in a nibble or two of dessert.  Brushing your teeth will remove the taste of food from your mouth, and the clean, minty freshness will serve as a cue that mealtime is over.  5.  FOCUS ON CROWDING NOT CUTTING  The most common first step during 'dieting' is to cut. We cut our portion sizes down, we cut out 'bad' foods, we cut out entire food groups. This act of cutting puts [place] and our minds into scarcity mode.  When  something is off-limits, even if you're able to avoid it for a while, you could end up bingeing on it later because  you've gone so long without it. So, instead of cutting, focus on crowding. If you crowd your plate and fill it up with more foods like veggies and protein, it simply allows less room for the other stuff. In other words, shift your focus away from what you can't eat, and celebrate the foods that will help you reach your goals.  6.  TAKE TRACKING A STEP FURTHER  Track what you eat, when you ate it, how much you ate and how that food made you feel. Being completely honest with yourself and writing down every single thing that passes through your lips will help you start to notice that maybe you actually do snack, possibly take in more sugar than you thought, eat when you're bored rather than just hungry or maybe that you have a habit of snacking before bed while watching TV.  The difference from simply tracking your food intake is you're taking into account how food makes you feel, as well as what you're doing while you're eating. This is about becoming more mindful of what, when and why you eat.  7.  PRIORITIZE GOOD SLEEP  One of the strongest risk factors for being overweight is poor sleep. When you're feeling tired, you're more likely to choose unhealthy comfort foods and to skip your workout. Additionally, sleep deprivation may slow down your metabolism. Burnett KanarisYikes! Therefore, sleeping 7-8 hours per night can help with weight loss without having to change your diet or increase your physical activity. And if you feel you snore and still wake up tired, talk with me about sleep apnea.  8.  SET ASIDE TIME TO DISCONNECT  Just get out there. Disconnect from the electronics and connect to the elements. Not only will this help reduce stress (a major factor in weight gain) by giving your mind a break from the constant stimulation we've all become so accustomed to, but it may also reprogram your brain to connect with yourself and what you're feeling.

## 2017-02-23 LAB — URINALYSIS, ROUTINE W REFLEX MICROSCOPIC
Bilirubin Urine: NEGATIVE
Glucose, UA: NEGATIVE
HYALINE CAST: NONE SEEN /LPF
Ketones, ur: NEGATIVE
Leukocytes, UA: NEGATIVE
Nitrite: NEGATIVE
PH: 6.5 (ref 5.0–8.0)
Protein, ur: NEGATIVE
Specific Gravity, Urine: 1.021 (ref 1.001–1.03)
WBC UA: NONE SEEN /HPF (ref 0–5)

## 2017-02-23 LAB — CBC WITH DIFFERENTIAL/PLATELET
BASOS ABS: 20 {cells}/uL (ref 0–200)
BASOS PCT: 0.3 %
EOS ABS: 33 {cells}/uL (ref 15–500)
Eosinophils Relative: 0.5 %
HEMATOCRIT: 37.2 % (ref 35.0–45.0)
HEMOGLOBIN: 12.9 g/dL (ref 11.7–15.5)
Lymphs Abs: 1815 cells/uL (ref 850–3900)
MCH: 31.5 pg (ref 27.0–33.0)
MCHC: 34.7 g/dL (ref 32.0–36.0)
MCV: 91 fL (ref 80.0–100.0)
MPV: 9.2 fL (ref 7.5–12.5)
Monocytes Relative: 5.8 %
NEUTROS ABS: 4349 {cells}/uL (ref 1500–7800)
Neutrophils Relative %: 65.9 %
Platelets: 310 10*3/uL (ref 140–400)
RBC: 4.09 10*6/uL (ref 3.80–5.10)
RDW: 12.1 % (ref 11.0–15.0)
Total Lymphocyte: 27.5 %
WBC: 6.6 10*3/uL (ref 3.8–10.8)
WBCMIX: 383 {cells}/uL (ref 200–950)

## 2017-02-23 LAB — IRON, TOTAL/TOTAL IRON BINDING CAP
%SAT: 28 % (ref 11–50)
Iron: 121 ug/dL (ref 40–190)
TIBC: 425 mcg/dL (calc) (ref 250–450)

## 2017-02-23 LAB — MICROALBUMIN / CREATININE URINE RATIO
Creatinine, Urine: 211 mg/dL (ref 20–275)
MICROALB UR: 0.7 mg/dL
MICROALB/CREAT RATIO: 3 ug/mg{creat} (ref <30)

## 2017-02-23 LAB — BASIC METABOLIC PANEL WITH GFR
BUN: 12 mg/dL (ref 7–25)
CO2: 26 mmol/L (ref 20–32)
Calcium: 9.6 mg/dL (ref 8.6–10.2)
Chloride: 101 mmol/L (ref 98–110)
Creat: 0.97 mg/dL (ref 0.50–1.10)
GFR, EST AFRICAN AMERICAN: 81 mL/min/{1.73_m2} (ref >=60)
GFR, EST NON AFRICAN AMERICAN: 70 mL/min/{1.73_m2} (ref >=60)
Glucose, Bld: 89 mg/dL (ref 65–99)
Potassium: 4 mmol/L (ref 3.5–5.3)
Sodium: 138 mmol/L (ref 135–146)

## 2017-02-23 LAB — HEPATIC FUNCTION PANEL
AG Ratio: 1.6 (calc) (ref 1.0–2.5)
ALT: 18 U/L (ref 6–29)
AST: 21 U/L (ref 10–35)
Albumin: 4.5 g/dL (ref 3.6–5.1)
Alkaline phosphatase (APISO): 59 U/L (ref 33–115)
Bilirubin, Direct: 0.1 mg/dL (ref 0.0–0.2)
Globulin: 2.8 g/dL (calc) (ref 1.9–3.7)
Indirect Bilirubin: 0.3 mg/dL (calc) (ref 0.2–1.2)
Total Bilirubin: 0.4 mg/dL (ref 0.2–1.2)
Total Protein: 7.3 g/dL (ref 6.1–8.1)

## 2017-02-23 LAB — LIPID PANEL
CHOL/HDL RATIO: 2.9 (calc) (ref <5.0)
CHOLESTEROL: 211 mg/dL — AB (ref <200)
HDL: 73 mg/dL (ref >50)
LDL CHOLESTEROL (CALC): 108 mg/dL — AB
NON-HDL CHOLESTEROL (CALC): 138 mg/dL — AB (ref <130)
Triglycerides: 189 mg/dL — ABNORMAL HIGH (ref <150)

## 2017-02-23 LAB — MAGNESIUM: Magnesium: 2 mg/dL (ref 1.5–2.5)

## 2017-02-23 LAB — VITAMIN D 25 HYDROXY (VIT D DEFICIENCY, FRACTURES): VIT D 25 HYDROXY: 27 ng/mL — AB (ref 30–100)

## 2017-02-23 LAB — TSH: TSH: 1.73 mIU/L

## 2017-02-26 ENCOUNTER — Other Ambulatory Visit: Payer: Self-pay | Admitting: Internal Medicine

## 2017-04-15 ENCOUNTER — Other Ambulatory Visit: Payer: Self-pay | Admitting: Internal Medicine

## 2017-04-15 MED ORDER — SERTRALINE HCL 100 MG PO TABS: 100.0000 mg | ORAL_TABLET | Freq: Every day | ORAL | 1 refills | Status: DC

## 2017-04-26 DIAGNOSIS — M25571 Pain in right ankle and joints of right foot: Secondary | ICD-10-CM | POA: Diagnosis not present

## 2017-04-26 DIAGNOSIS — M722 Plantar fascial fibromatosis: Secondary | ICD-10-CM | POA: Diagnosis not present

## 2017-04-26 DIAGNOSIS — M79672 Pain in left foot: Secondary | ICD-10-CM | POA: Diagnosis not present

## 2017-04-27 ENCOUNTER — Other Ambulatory Visit: Payer: Self-pay | Admitting: Physician Assistant

## 2017-04-27 NOTE — Progress Notes (Signed)
Future Appointments  Date Time Provider Department Center  05/24/2017  2:30 PM Quentin MullingCollier, Orestes Geiman, PA-C [facility] None  03/06/2018 10:00 AM Quentin Mullingollier, Silveria Botz, PA-C [facility] None

## 2017-05-22 NOTE — Progress Notes (Signed)
Assessment and Plan:   Essential hypertension - continue medications, DASH diet, exercise and monitor at home. Call if greater than 130/80.  -     CBC with Differential/Platelet -     COMPLETE METABOLIC PANEL WITH GFR -     TSH  Mixed hyperlipidemia -continue medications, check lipids, decrease fatty foods, increase activity.  -     Lipid panel  Mild episode of recurrent major depressive disorder (HCC) - continue medications, stress management techniques discussed, increase water, good sleep hygiene discussed, increase exercise, and increase veggies.   Medication management -     Magnesium  Class 1 obesity due to excess calories with serious comorbidity in adult, unspecified BMI -     phentermine (ADIPEX-P) 37.5 MG tablet; Take 1 tablet (37.5 mg total) by mouth daily before breakfast. - - follow up 1 month for progress monitoring - long discussion about weight loss, diet, and exercise, will start the patient on phentermine- hand out given and AE's discussed, will do close follow up.   Chronic midline thoracic back pain -     Ambulatory referral to Orthopedics - MAY BENEFIT FROM BREAST REDUCTION  Prediabetes -     Hemoglobin A1c   Continue diet and meds as discussed. Further disposition pending results of labs. Discussed med's effects and SE's.    HPI 47 y.o. female  presents for 3 month follow up with hypertension, hyperlipidemia, diabetes and vitamin D deficiency.   Her blood pressure has been controlled at home, has been 130/90's at home, today their BP is BP: 120/90.She does not workout. She denies chest pain, shortness of breath, dizziness.   She is on cholesterol medication and denies myalgias. Her cholesterol is at goal. The cholesterol was:  02/22/2017: Cholesterol 211; HDL 73; LDL Cholesterol (Calc) 108; Triglycerides 189   She has been working on diet and exercise for diabetes without complications, she is on bASA, she is on ACE/ARB, and denies  foot ulcerations,  hyperglycemia, hypoglycemia , increased appetite, nausea, paresthesia of the feet, polydipsia, polyuria, visual disturbances, vomiting and weight loss. Last A1C was: 06/22/2016: Hgb A1c MFr Bld 5.4.   Patient is on Vitamin D supplement.  Looking for job right now.  BMI is Body mass index is 40.59 kg/m., she is working on diet and exercise Has not been exercising due to plantar fasciitis.  Wt Readings from Last 3 Encounters:  05/24/17 214 lb 12.8 oz (97.4 kg)  02/22/17 207 lb 6.4 oz (94.1 kg)  11/14/16 206 lb 6.4 oz (93.6 kg)   She was seen for neck/thoracic back pain at her CPE, was referred to PT but did not go, still having issues, baclofen is not helping.    Current Medications:  Current Outpatient Medications on File Prior to Visit  Medication Sig Dispense Refill  . aspirin 81 MG tablet Take 81 mg by mouth daily.    Marland Kitchen BIOTIN PO Take by mouth.    Marland Kitchen buPROPion (WELLBUTRIN XL) 300 MG 24 hr tablet Take 1 tablet (300 mg total) by mouth every morning. 30 tablet 6  . Cholecalciferol (VITAMIN D) 2000 UNITS tablet Take 2,000 Units by mouth daily. Pt takes [employer] Bone Health    . Cyanocobalamin (VITAMIN B 12) 100 MCG LOZG Take by mouth.    Marland Kitchen ipratropium (ATROVENT) 0.03 % nasal spray PLACE 2 SPRAYS INTO THE NOSE 3 (THREE) TIMES DAILY. 90 mL 1  . magnesium 30 MG tablet Take 30 mg by mouth 2 (two) times daily. Takes twice a  week    . meloxicam (MOBIC) 15 MG tablet Take 1 tablet (15 mg total) by mouth daily. 90 tablet 0  . montelukast (SINGULAIR) 10 MG tablet TAKE 1 TABLET BY MOUTH EVERY DAY 90 tablet 3  . norgestimate-ethinyl estradiol (SPRINTEC 28) 0.25-35 MG-MCG tablet Take 1 tablet by mouth daily. 3 Package 3  . nystatin cream (MYCOSTATIN) Apply 1 application topically 2 (two) times daily. 30 g 1  . omeprazole (PRILOSEC) 40 MG capsule TAKE 1 CAPSULE BY MOUTH DAILY 90 capsule 1  . OVER THE COUNTER MEDICATION Patient takes 1 gummy multivitamin daily.    Marland Kitchen OVER THE COUNTER MEDICATION  Takes OTC Zyrtec 10 mg PRN.    . ranitidine (ZANTAC) 300 MG tablet Take 1/2-1 tab as needed at night. 30 tablet 2  . sertraline (ZOLOFT) 100 MG tablet Take 1 tablet (100 mg total) by mouth daily. 90 tablet 1  . telmisartan (MICARDIS) 40 MG tablet Take 1 tablet (40 mg total) by mouth daily. 90 tablet 1  . traZODone (DESYREL) 100 MG tablet TAKE 1 TABLET (100 MG TOTAL) BY MOUTH AT BEDTIME. 90 tablet 1  . triamcinolone ointment (KENALOG) 0.1 % Apply 1 application topically 2 (two) times daily. 80 g 1  . fluticasone (FLONASE) 50 MCG/ACT nasal spray Place 2 sprays into both nostrils daily. 16 g 2   No current facility-administered medications on file prior to visit.    Medical History:  Past Medical History:  Diagnosis Date  . Anxiety   . Depression    no meds  . Hyperlipidemia   . Hypertension    no meds   Allergies:  Allergies  Allergen Reactions  . Erythromycin Nausea Only and Other (See Comments)    Stomach cramping (can take Z Pak)  . Flexeril [Cyclobenzaprine Hcl] Other (See Comments)    "don't like the way I feel"  . Metformin And Related     GI Upset     Review of Systems:  Review of Systems  Constitutional: Negative for chills, fever and malaise/fatigue.  HENT: Negative for congestion, ear pain and sore throat.   Eyes: Negative.   Respiratory: Negative for cough, shortness of breath and wheezing.   Cardiovascular: Negative for chest pain, palpitations and leg swelling.  Gastrointestinal: Negative for abdominal pain, blood in stool, constipation, diarrhea, heartburn and melena.  Genitourinary: Negative.   Musculoskeletal: Positive for back pain and neck pain.  Skin: Negative.   Neurological: Negative for dizziness, sensory change, loss of consciousness and headaches.  Psychiatric/Behavioral: Negative for depression. The patient is not nervous/anxious and does not have insomnia.     Family history- Review and unchanged  Social history- Review and unchanged  Physical  Exam: BP 120/90   Pulse 92   Temp 97.6 F (36.4 C)   Ht 5\' 1"  (1.549 m)   Wt 214 lb 12.8 oz (97.4 kg)   SpO2 96%   BMI 40.59 kg/m  Wt Readings from Last 3 Encounters:  05/24/17 214 lb 12.8 oz (97.4 kg)  02/22/17 207 lb 6.4 oz (94.1 kg)  11/14/16 206 lb 6.4 oz (93.6 kg)   General Appearance: Well nourished well developed, non-toxic appearing, in no apparent distress. Eyes: PERRLA, EOMs, conjunctiva no swelling or erythema ENT/Mouth: Ear canals clear with no erythema, swelling, or discharge.  TMs normal bilaterally, oropharynx clear, moist, with no exudate.   Neck: Supple, thyroid normal, no JVD, no cervical adenopathy.  Respiratory: Respiratory effort normal, breath sounds clear A&P, no wheeze, rhonchi or rales noted.  No retractions, no accessory muscle usage Cardio: RRR with no MRGs. No noted edema.  Abdomen: Soft, + BS.  Non tender, no guarding, rebound, hernias, masses. Musculoskeletal: Full ROM, 5/5 strength, Normal gait Skin: Warm, dry without rashes, lesions, ecchymosis.  Neuro: Awake and oriented X 3, Cranial nerves intact. No cerebellar symptoms.  Psych: normal affect, Insight and Judgment appropriate.    Quentin MullingAmanda Elijahjames Fuelling, PA-C 2:37 PM [facility]

## 2017-05-24 ENCOUNTER — Ambulatory Visit: Payer: 59 | Admitting: Physician Assistant

## 2017-05-24 ENCOUNTER — Other Ambulatory Visit: Payer: Self-pay

## 2017-05-24 VITALS — BP 120/90 | HR 92 | Temp 97.6°F | Ht 61.0 in | Wt 214.8 lb

## 2017-05-24 DIAGNOSIS — M546 Pain in thoracic spine: Secondary | ICD-10-CM | POA: Diagnosis not present

## 2017-05-24 DIAGNOSIS — R7303 Prediabetes: Secondary | ICD-10-CM

## 2017-05-24 DIAGNOSIS — J069 Acute upper respiratory infection, unspecified: Secondary | ICD-10-CM

## 2017-05-24 DIAGNOSIS — E782 Mixed hyperlipidemia: Secondary | ICD-10-CM

## 2017-05-24 DIAGNOSIS — I10 Essential (primary) hypertension: Secondary | ICD-10-CM | POA: Diagnosis not present

## 2017-05-24 DIAGNOSIS — Z79899 Other long term (current) drug therapy: Secondary | ICD-10-CM

## 2017-05-24 DIAGNOSIS — E6609 Other obesity due to excess calories: Secondary | ICD-10-CM

## 2017-05-24 DIAGNOSIS — F330 Major depressive disorder, recurrent, mild: Secondary | ICD-10-CM

## 2017-05-24 DIAGNOSIS — G8929 Other chronic pain: Secondary | ICD-10-CM

## 2017-05-24 DIAGNOSIS — M5441 Lumbago with sciatica, right side: Secondary | ICD-10-CM

## 2017-05-24 DIAGNOSIS — E66811 Obesity, class 1: Secondary | ICD-10-CM

## 2017-05-24 MED ORDER — PRAVASTATIN SODIUM 40 MG PO TABS: 40.0000 mg | ORAL_TABLET | Freq: Every day | ORAL | 1 refills | Status: DC

## 2017-05-24 MED ORDER — PHENTERMINE HCL 37.5 MG PO TABS: 37.5000 mg | ORAL_TABLET | Freq: Every day | ORAL | 2 refills | Status: DC

## 2017-05-24 MED ORDER — ALBUTEROL SULFATE HFA 108 (90 BASE) MCG/ACT IN AERS: 2.0000 | INHALATION_SPRAY | RESPIRATORY_TRACT | 0 refills | Status: DC | PRN

## 2017-05-24 MED ORDER — BACLOFEN 10 MG PO TABS: 10.0000 mg | ORAL_TABLET | Freq: Two times a day (BID) | ORAL | 1 refills | Status: DC

## 2017-05-24 NOTE — Patient Instructions (Addendum)
Check out  Mini habits for weight loss book  2 apps for tracking food is myfitness pal  loseit OR can take picture of your food  Are you an emotional eater? Do you eat more when you're feeling stressed? Do you eat when you're not hungry or when you're full? Do you eat to feel better (to calm and soothe yourself when you're sad, mad, bored, anxious, etc.)? Do you reward yourself with food? Do you regularly eat until you've stuffed yourself? Does food make you feel safe? Do you feel like food is a friend? Do you feel powerless or out of control around food?  If you answered yes to some of these questions than it is likely that you are an emotional eater. This is normally a learned behavior and can take time to first recognize the signs and second BREAK THE HABIT. But here is more information and tips to help.   The difference between emotional hunger and physical hunger Emotional hunger can be powerful, so it's easy to mistake it for physical hunger. But there are clues you can look for to help you tell physical and emotional hunger apart.  Emotional hunger comes on suddenly. It hits you in an instant and feels overwhelming and urgent. Physical hunger, on the other hand, comes on more gradually. The urge to eat doesn't feel as dire or demand instant satisfaction (unless you haven't eaten for a very long time).  Emotional hunger craves specific comfort foods. When you're physically hungry, almost anything sounds good-including healthy stuff like vegetables. But emotional hunger craves junk food or sugary snacks that provide an instant rush. You feel like you need cheesecake or pizza, and nothing else will do.  Emotional hunger often leads to mindless eating. Before you know it, you've eaten a whole bag of chips or an entire pint of ice cream without really paying attention or fully enjoying it. When you're eating in response to physical hunger, you're typically more aware of what you're  doing.  Emotional hunger isn't satisfied once you're full. You keep wanting more and more, often eating until you're uncomfortably stuffed. Physical hunger, on the other hand, doesn't need to be stuffed. You feel satisfied when your stomach is full.  Emotional hunger isn't located in the stomach. Rather than a growling belly or a pang in your stomach, you feel your hunger as a craving you can't get out of your head. You're focused on specific textures, tastes, and smells.  Emotional hunger often leads to regret, guilt, or shame. When you eat to satisfy physical hunger, you're unlikely to feel guilty or ashamed because you're simply giving your body what it needs. If you feel guilty after you eat, it's likely because you know deep down that you're not eating for nutritional reasons.  Identify your emotional eating triggers What situations, places, or feelings make you reach for the comfort of food? Most emotional eating is linked to unpleasant feelings, but it can also be triggered by positive emotions, such as rewarding yourself for achieving a goal or celebrating a holiday or happy event. Common causes of emotional eating include:  Stuffing emotions - Eating can be a way to temporarily silence or "stuff down" uncomfortable emotions, including anger, fear, sadness, anxiety, loneliness, resentment, and shame. While you're numbing yourself with food, you can avoid the difficult emotions you'd rather not feel.  Boredom or feelings of emptiness - Do you ever eat simply to give yourself something to do, to relieve boredom, or as a  way to fill a void in your life? You feel unfulfilled and empty, and food is a way to occupy your mouth and your time. In the moment, it fills you up and distracts you from underlying feelings of purposelessness and dissatisfaction with your life.  Childhood habits - Think back to your childhood memories of food. Did your parents reward good behavior with ice cream, take you out  for pizza when you got a good report card, or serve you sweets when you were feeling sad? These habits can often carry over into adulthood. Or your eating may be driven by nostalgia-for cherished memories of grilling burgers in the backyard with your dad or baking and eating cookies with your mom.  Social influences - Getting together with other people for a meal is a great way to relieve stress, but it can also lead to overeating. It's easy to overindulge simply because the food is there or because everyone else is eating. You may also overeat in social situations out of nervousness. Or perhaps your family or circle of friends encourages you to overeat, and it's easier to go along with the group.  Stress - Ever notice how stress makes you hungry? It's not just in your mind. When stress is chronic, as it so often is in our chaotic, fast-paced world, your body produces high levels of the stress hormone, cortisol. Cortisol triggers cravings for salty, sweet, and fried foods-foods that give you a burst of energy and pleasure. The more uncontrolled stress in your life, the more likely you are to turn to food for emotional relief.  Find other ways to feed your feelings If you don't know how to manage your emotions in a way that doesn't involve food, you won't be able to control your eating habits for very long. Diets so often fail because they offer logical nutritional advice which only works if you have conscious control over your eating habits. It doesn't work when emotions hijack the process, demanding an immediate payoff with food.  In order to stop emotional eating, you have to find other ways to fulfill yourself emotionally. It's not enough to understand the cycle of emotional eating or even to understand your triggers, although that's a huge first step. You need alternatives to food that you can turn to for emotional fulfillment.  Alternatives to emotional eating If you're depressed or lonely, call  someone who always makes you feel better, play with your dog or cat, or look at a favorite photo or cherished memento.  If you're anxious, expend your nervous energy by dancing to your favorite song, squeezing a stress ball, or taking a brisk walk.  If you're exhausted, treat yourself with a hot cup of tea, take a bath, light some scented candles, or wrap yourself in a warm blanket.  If you're bored, read a good book, watch a comedy show, explore the outdoors, or turn to an activity you enjoy (woodworking, playing the guitar, shooting hoops, scrapbooking, etc.).  What is mindful eating? Mindful eating is a practice that develops your awareness of eating habits and allows you to pause between your triggers and your actions. Most emotional eaters feel powerless over their food cravings. When the urge to eat hits, you feel an almost unbearable tension that demands to be fed, right now. Because you've tried to resist in the past and failed, you believe that your willpower just isn't up to snuff. But the truth is that you have more power over your cravings than you  think.  Take 5 before you give in to a craving Emotional eating tends to be automatic and virtually mindless. Before you even realize what you're doing, you've reached for a tub of ice cream and polished off half of it. But if you can take a moment to pause and reflect when you're hit with a craving, you give yourself the opportunity to make a different decision.  Can you put off eating for five minutes? Or just start with one minute. Don't tell yourself you can't give in to the craving; remember, the forbidden is extremely tempting. Just tell yourself to wait.  While you're waiting, check in with yourself. How are you feeling? What's going on emotionally? Even if you end up eating, you'll have a better understanding of why you did it. This can help you set yourself up for a different response next time.  How to practice mindful  eating Eating while you're also doing other things-such as watching TV, driving, or playing with your phone-can prevent you from fully enjoying your food. Since your mind is elsewhere, you may not feel satisfied or continue eating even though you're no longer hungry. Eating more mindfully can help focus your mind on your food and the pleasure of a meal and curb overeating.   Eat your meals in a calm place with no distractions, aside from any dining companions.  Try eating with your non-dominant hand or using chopsticks instead of a knife and fork. Eating in such a non-familiar way can slow down how fast you eat and ensure your mind stays focused on your food.  Allow yourself enough time not to have to rush your meal. Set a timer for 20 minutes and pace yourself so you spend at least that much time eating.  Take small bites and chew them well, taking time to notice the different flavors and textures of each mouthful.  Put your utensils down between bites. Take time to consider how you feel-hungry, satiated-before picking up your utensils again.  Try to stop eating before you are full.It takes time for the signal to reach your brain that you've had enough. Don't feel obligated to always clean your plate.  When you've finished your food, take a few moments to assess if you're really still hungry before opting for an extra serving or dessert.  Learn to accept your feelings-even the bad ones  While it may seem that the core problem is that you're powerless over food, emotional eating actually stems from feeling powerless over your emotions. You don't feel capable of dealing with your feelings head on, so you avoid them with food.  Recommended reading  Mini Habits for weight loss  Healthy Eating: A guide to the new nutrition Copy Special Health Report  10 Tips for Mindful Eating - How mindfulness can help you fully enjoy a meal and the experience of eating-with moderation  and restraint. [facility] Health blog)  Weight Loss: Gain Control of Emotional Eating - Tips to regain control of your eating habits. [facility])  Why Stress Causes People to Overeat -Tips on controlling stress eating. [employer])  Mindful Eating Meditations -Free online mindfulness meditations. ([employer] [facility] [employer] Mindful [employer])    Veggies are great because you can eat a ton! They are low in calories, great to fill you up, and have a ton of vitamins, minerals, and protein.      Intermittent fasting is more about strategy than starvation. It's meant to reset your body in  different ways, hopefully with fitness and nutrition changes as a result.  Like any big switchover, though, results may vary when it comes down to the individual level. What works for your friends may not work for you, or vice versa. That's why it's helpful to play around with variations on intermittent fasting and healthy habits and find what works best for you.  WHAT IS INTERMITTENT FASTING AND WHY DO IT?  Intermittent fasting doesn't involve specific foods, but rather, a strict schedule regarding when you eat. Also called "time-restricted eating," the tactic has been praised for its contribution to weight loss, improved body composition, and decreased cravings. Preliminary research also suggests it may be beneficial for glucose tolerance, hormone regulation, better muscle mass and lower body fat.  Part of its appeal is the simplicity of the effort. Unlike some other trends, there's no calculations to intermittent fasting.  You simply eat within a certain block of time, usually a window of 8-10 hours. In the other big block of time - about 14-16 hours, including when you're asleep - you don't eat anything, not even snacks. You can drink water, coffee, tea or any other beverage that doesn't have calories.  For example, if you like having a late dinner, you might skip breakfast and have your first meal at  noon and your last meal of the day at 8 p.m., and then not eat until noon again the next day.  IDEAS FOR GETTING STARTED  If you're new to the strategy, it may be helpful to eat within the typical circadian rhythm and keep eating within daylight hours. This can be especially beneficial if you're looking at intermittent fasting for weight-loss goals.  So first try only eating between 12pm to 8pm.  Outside of this time you may have water, black coffee, and hot tea. You may not eat it drink anything that has carbs, sugars, OR artificial sugars like diet soda.   Like any major eating and fitness shift, it can take time to find the perfect fit, so don't be afraid to experiment with different options - including ditching intermittent fasting altogether if it's simply not for you. But if it is, you may be surprised by some of the benefits that come along with the strategy.  Phentermine  While taking the medication we may ask that you come into the office once a month for the first month for a blood pressure check and EKG.   Also please bring in a food log for that visit to review. It is helpful if you bring in a food diary or use an app on your phone such as myfitnesspal to record your calorie intake, especially in the beginning. BRING FOR YOUR FIRST VISIT.  After that first initial visit, we will want to see you once every 2-3 months to monitor your weight, blood pressure, and heart rate.   In addition we can help answer your questions about diet, exercise, and help you every step of the way with your weight loss journey.  You can start out on 1/3 to 1/2 a pill in the morning and if you are tolerating it well you can increase to one pill daily. I also have some patients that take 1/3 or 1/2 at lunch to help prevent night time eating.  This medication is cheapest CASH pay at [facility] OR [employer] OR [facility] [employer] is 14-17 dollars and you do NOT need a membership to get meds from there.   It causes dry  mouth and constipation  in almost every patient, so try to get 80-100 oz of water a day and increase fiber such as veggies. You can add on a stool softener if you would like.   It can give you energy however it can also cause some people to be shaky, anxious or have palpitations. Stop this medication if that happens and contact the office.   If this medication does not work for you there are several medications that we can try to help rewire your brain in addition to making healthier habits.   What is this medicine? PHENTERMINE (FEN ter meen) decreases your appetite. This medicine is intended to be used in addition to a healthy reduced calorie diet and exercise. The best results are achieved this way. This medicine is only indicated for short-term use. Eventually your weight loss may level out and the medication will no longer be needed.   How should I use this medicine? Take this medicine by mouth. Follow the directions on the prescription label. The tablets should stay in the bottle until immediately before you take your dose. Take your doses at regular intervals. Do not take your medicine more often than directed.  Overdosage: If you think you have taken too much of this medicine contact a poison control center or emergency room at once. NOTE: This medicine is only for you. Do not share this medicine with others.  What if I miss a dose? If you miss a dose, take it as soon as you can. If it is almost time for your next dose, take only that dose. Do not take double or extra doses. Do not increase or in any way change your dose without consulting your doctor.  What should I watch for while using this medicine? Notify your physician immediately if you become short of breath while doing your normal activities. Do not take this medicine within 6 hours of bedtime. It can keep you from getting to sleep. Avoid drinks that contain caffeine and try to stick to a regular bedtime every night. Do not stand  or sit up quickly, especially if you are an older patient. This reduces the risk of dizzy or fainting spells. Avoid alcoholic drinks.  What side effects may I notice from receiving this medicine? Side effects that you should report to your doctor or health care professional as soon as possible: -chest pain, palpitations -depression or severe changes in mood -increased blood pressure -irritability -nervousness or restlessness -severe dizziness -shortness of breath -problems urinating -unusual swelling of the legs -vomiting  Side effects that usually do not require medical attention (report to your doctor or health care professional if they continue or are bothersome): -blurred vision or other eye problems -changes in sexual ability or desire -constipation or diarrhea -difficulty sleeping -dry mouth or unpleasant taste -headache -nausea This list may not describe all possible side effects. Call your doctor for medical advice about side effects. You may report side effects to FDA at 1-800-FDA-1088.

## 2017-05-29 LAB — HEMOGLOBIN A1C
Hgb A1c MFr Bld: 5.4 % of total Hgb (ref <5.7)
Mean Plasma Glucose: 108 (calc)
eAG (mmol/L): 6 (calc)

## 2017-05-29 LAB — COMPLETE METABOLIC PANEL WITH GFR
AG Ratio: 1.5 (calc) (ref 1.0–2.5)
ALKALINE PHOSPHATASE (APISO): 59 U/L (ref 33–115)
ALT: 11 U/L (ref 6–29)
AST: 13 U/L (ref 10–35)
Albumin: 4.1 g/dL (ref 3.6–5.1)
BUN: 15 mg/dL (ref 7–25)
CO2: 29 mmol/L (ref 20–32)
CREATININE: 1.09 mg/dL (ref 0.50–1.10)
Calcium: 9.7 mg/dL (ref 8.6–10.2)
Chloride: 101 mmol/L (ref 98–110)
GFR, Est African American: 70 mL/min/{1.73_m2} (ref >=60)
GFR, Est Non African American: 60 mL/min/{1.73_m2} (ref >=60)
GLUCOSE: 105 mg/dL — AB (ref 65–99)
Globulin: 2.8 g/dL (calc) (ref 1.9–3.7)
Potassium: 5 mmol/L (ref 3.5–5.3)
Sodium: 137 mmol/L (ref 135–146)
Total Bilirubin: 0.3 mg/dL (ref 0.2–1.2)
Total Protein: 6.9 g/dL (ref 6.1–8.1)

## 2017-05-29 LAB — CBC WITH DIFFERENTIAL/PLATELET
BASOS ABS: 17 {cells}/uL (ref 0–200)
Basophils Relative: 0.3 %
EOS PCT: 1.7 %
Eosinophils Absolute: 99 cells/uL (ref 15–500)
HCT: 37.6 % (ref 35.0–45.0)
HEMOGLOBIN: 12.8 g/dL (ref 11.7–15.5)
Lymphs Abs: 1885 cells/uL (ref 850–3900)
MCH: 31.5 pg (ref 27.0–33.0)
MCHC: 34 g/dL (ref 32.0–36.0)
MCV: 92.6 fL (ref 80.0–100.0)
MONOS PCT: 9.7 %
MPV: 9.2 fL (ref 7.5–12.5)
NEUTROS ABS: 3236 {cells}/uL (ref 1500–7800)
Neutrophils Relative %: 55.8 %
PLATELETS: 286 10*3/uL (ref 140–400)
RBC: 4.06 10*6/uL (ref 3.80–5.10)
RDW: 12.2 % (ref 11.0–15.0)
Total Lymphocyte: 32.5 %
WBC mixed population: 563 cells/uL (ref 200–950)
WBC: 5.8 10*3/uL (ref 3.8–10.8)

## 2017-05-29 LAB — LIPID PANEL
CHOL/HDL RATIO: 2.7 (calc) (ref <5.0)
CHOLESTEROL: 247 mg/dL — AB (ref <200)
HDL: 90 mg/dL (ref >50)
LDL CHOLESTEROL (CALC): 126 mg/dL — AB
NON-HDL CHOLESTEROL (CALC): 157 mg/dL — AB (ref <130)
TRIGLYCERIDES: 192 mg/dL — AB (ref <150)

## 2017-05-29 LAB — MAGNESIUM: Magnesium: 2.1 mg/dL (ref 1.5–2.5)

## 2017-05-29 LAB — TSH: TSH: 3.88 mIU/L

## 2017-05-31 ENCOUNTER — Encounter: Payer: Self-pay | Admitting: Physician Assistant

## 2017-06-12 ENCOUNTER — Ambulatory Visit (INDEPENDENT_AMBULATORY_CARE_PROVIDER_SITE_OTHER): Payer: 59 | Admitting: Orthopedic Surgery

## 2017-06-12 ENCOUNTER — Ambulatory Visit (INDEPENDENT_AMBULATORY_CARE_PROVIDER_SITE_OTHER): Payer: 59

## 2017-06-12 ENCOUNTER — Encounter (INDEPENDENT_AMBULATORY_CARE_PROVIDER_SITE_OTHER): Payer: Self-pay | Admitting: Orthopedic Surgery

## 2017-06-12 DIAGNOSIS — M545 Low back pain: Secondary | ICD-10-CM

## 2017-06-12 DIAGNOSIS — M549 Dorsalgia, unspecified: Secondary | ICD-10-CM
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
## 2017-06-21 ENCOUNTER — Ambulatory Visit: Payer: Self-pay | Admitting: Adult Health

## 2017-06-28 NOTE — Progress Notes (Signed)
Assessment and Plan:  Diagnoses and all orders for this visit:  Morbid obesity (HCC) Very long discussion about weight loss, diet, and exercise Discussed final goal weight and current weight loss goal (200 lb) Patient will work on start tracking food in app - lose it! Or myfitness PAL suggested - to evaluate actual caloric intake and macro imbalances x 4 weeks  Patient on phentermine with some perceived benefit and no SE; we will continue this for another few months. Continue close follow up in 3-4 months   Essential hypertension Continue medication Monitor blood pressure at home; call if consistently over 130/80 Continue DASH diet.   Reminder to go to the ER if any CP, SOB, nausea, dizziness, severe HA, changes vision/speech, left arm numbness and tingling and jaw pain.  Insomnia, unspecified type Trazodone helpful, however suspect back pain and carpal tunnel significantly contributing Advised her to look into a new mattress as hasn't been replaced in 12 years, supportive pillow Follow up next week with ortho to discuss back/carpal tunnel - good sleep hygiene discussed, increase day time activity, continue with trazodone  Carpal tunnel, bilateral Suggested carpal tunnel braces bilaterally at night, mobic daily  Follow up with ortho to discuss treatment options due to failure of conservative treatments -     meloxicam (MOBIC) 15 MG tablet; Take 1 tablet (15 mg total) by mouth daily.  Further disposition pending results of labs. Discussed med's effects and SE's.   Over 30 minutes of exam, counseling, chart review, and critical decision making was performed.   Future Appointments  Date Time Provider Department Center  03/06/2018 10:00 AM Quentin Mulling, PA-C [facility] None    ------------------------------------------------------------------------------------------------------------------   HPI BP 126/80   Pulse 84   Temp 97.7 F (36.5 C)   Ht  (1.549 m)   Wt 215 lb  (97.5 kg)   SpO2 97%   BMI 40.62 kg/m   47 y.o.female presents for 1 month follow up after recent initiation of phentermine for morbid obesity, as well as increase of trazodone for insomnia to 150 mg daily. She reports this has helped some, but continues to wake up throughout the night. She reports back pain and hand numbness at night frequently awakens her. She has tried wearing bilateral hand braces at night but this has not been helpful. She does have +phalen's today after 20 sec. She is prescribed mobic 15 mg but hasn't taken in well over 6 months. She has follow up scheduled with ortho for back pain next week, encouraged her to follow up regarding ongoing carpal tunnel despite conservative therapy and to discuss alternative interventions.   she is prescribed phentermine for weight loss since 1 month ago; she reports she has taken inconsistently, about 2/3rds of the time.   While on the medication they have lost 0 lbs since last visit. They deny palpitations, anxiety, elevated BP.   BMI is Body mass index is 40.62 kg/m., she is working on diet and exercise. Wt Readings from Last 3 Encounters:  06/29/17 215 lb (97.5 kg)  05/24/17 214 lb 12.8 oz (97.4 kg)  02/22/17 207 lb 6.4 oz (94.1 kg)   Typical breakfast: Small bowl of cheerios or kashi cereal with organic milk or almond milk (unsweetened) - at 9 am.  Midmorning snack: fresh fruit (apple, pineapple, cherries, a banana, etc) Typical lunch: May or may not eat; typically has a salad with baked potato (adds small amount of ranch dressing), or may have broccoli or cauliflower tots (baked) with ranch (  small bowl, ~12 tots or so).  Typical dinner: Has been eating late; ~8 ounces chicken (italian dressing, barbecue, grilled), steak or pork chops with salad and green beans. Exercise: Has been walking 30-45 min every other day.  Drinks: water with crystal lite, rarely a diet soda (coke zero, diet sprite).   Snacks throughout the day on nut and  sunflower mix  2-3 small cupped handfuls.   Has "cheated" some - had small bowl of cherry cobbler, etc, mild indulgence about 4 times.   Her blood pressure has been controlled at home, today their BP is BP: 126/80  She does workout. She denies chest pain, shortness of breath, dizziness.   Past Medical History:  Diagnosis Date  . Anxiety   . Depression    no meds  . Hyperlipidemia   . Hypertension    no meds     Allergies  Allergen Reactions  . Erythromycin Nausea Only and Other (See Comments)    Stomach cramping (can take Z Pak)  . Flexeril [Cyclobenzaprine Hcl] Other (See Comments)    "don't like the way I feel"  . Metformin And Related     GI Upset    Current Outpatient Medications on File Prior to Visit  Medication Sig  . albuterol (VENTOLIN HFA) 108 (90 Base) MCG/ACT inhaler Inhale 2 puffs into the lungs every 4 (four) hours as needed for wheezing or shortness of breath.  Marland Kitchen aspirin 81 MG tablet Take 81 mg by mouth daily.  . baclofen (LIORESAL) 10 MG tablet Take 1 tablet (10 mg total) by mouth 2 (two) times daily. Take 1/2 to 1 tablet 2 x day if needed for muscle spasm  . BIOTIN PO Take by mouth.  Marland Kitchen buPROPion (WELLBUTRIN XL) 300 MG 24 hr tablet Take 1 tablet (300 mg total) by mouth every morning.  . Cholecalciferol (VITAMIN D) 2000 UNITS tablet Take 2,000 Units by mouth daily. Pt takes Finest Nutrition Bone Health  . Cyanocobalamin (VITAMIN B 12) 100 MCG LOZG Take by mouth.  Marland Kitchen ipratropium (ATROVENT) 0.03 % nasal spray PLACE 2 SPRAYS INTO THE NOSE 3 (THREE) TIMES DAILY.  . magnesium 30 MG tablet Take 30 mg by mouth 2 (two) times daily. Takes twice a week  . meloxicam (MOBIC) 15 MG tablet Take 1 tablet (15 mg total) by mouth daily.  . montelukast (SINGULAIR) 10 MG tablet TAKE 1 TABLET BY MOUTH EVERY DAY  . norgestimate-ethinyl estradiol (SPRINTEC 28) 0.25-35 MG-MCG tablet Take 1 tablet by mouth daily.  Marland Kitchen nystatin cream (MYCOSTATIN) Apply 1 application topically 2 (two)  times daily.  Marland Kitchen omeprazole (PRILOSEC) 40 MG capsule TAKE 1 CAPSULE BY MOUTH DAILY  . OVER THE COUNTER MEDICATION Patient takes 1 gummy multivitamin daily.  Marland Kitchen OVER THE COUNTER MEDICATION Takes OTC Zyrtec 10 mg PRN.  Marland Kitchen phentermine (ADIPEX-P) 37.5 MG tablet Take 1 tablet (37.5 mg total) by mouth daily before breakfast.  . pravastatin (PRAVACHOL) 40 MG tablet Take 1 tablet (40 mg total) by mouth daily.  . ranitidine (ZANTAC) 300 MG tablet Take 1/2-1 tab as needed at night.  . sertraline (ZOLOFT) 100 MG tablet Take 1 tablet (100 mg total) by mouth daily.  Marland Kitchen telmisartan (MICARDIS) 40 MG tablet Take 1 tablet (40 mg total) by mouth daily.  . traZODone (DESYREL) 100 MG tablet TAKE 1 TABLET (100 MG TOTAL) BY MOUTH AT BEDTIME.  Marland Kitchen triamcinolone ointment (KENALOG) 0.1 % Apply 1 application topically 2 (two) times daily.  . fluticasone (FLONASE) 50 MCG/ACT nasal spray Place  2 sprays into both nostrils daily.   No current facility-administered medications on file prior to visit.     ROS: Review of Systems  Constitutional: Negative for chills, fever, malaise/fatigue and weight loss.  HENT: Negative for hearing loss and tinnitus.   Eyes: Negative for blurred vision and double vision.  Respiratory: Negative for cough, shortness of breath and wheezing.   Cardiovascular: Negative for chest pain, palpitations, orthopnea, claudication and leg swelling.  Gastrointestinal: Negative for abdominal pain, blood in stool, constipation, diarrhea, heartburn, melena, nausea and vomiting.  Genitourinary: Negative.   Musculoskeletal: Positive for back pain. Negative for joint pain, myalgias and neck pain.  Skin: Negative for rash.  Neurological: Positive for tingling (Bilateral hands at night and dependent to gravity). Negative for dizziness, sensory change, weakness and headaches.  Endo/Heme/Allergies: Negative for polydipsia.  Psychiatric/Behavioral: Negative.   All other systems reviewed and are  negative.   Physical Exam:  BP 126/80   Pulse 84   Temp 97.7 F (36.5 C)   Ht  (1.549 m)   Wt 215 lb (97.5 kg)   SpO2 97%   BMI 40.62 kg/m   General Appearance: Well nourished, in no apparent distress. Eyes: PERRLA, EOMs, conjunctiva no swelling or erythema ENT/Mouth: No erythema, swelling, or exudate on post pharynx.  Tonsils not swollen or erythematous. Hearing normal.  Neck: Supple, thyroid normal.  Respiratory: Respiratory effort normal, BS equal bilaterally without rales, rhonchi, wheezing or stridor.  Cardio: RRR with no MRGs. Brisk peripheral pulses without edema.  Abdomen: Soft, + BS.  Non tender, no guarding, rebound, hernias, masses. Lymphatics: Non tender without lymphadenopathy.  Musculoskeletal: Full ROM, 5/5 strength, normal gait. Bilateral phalen's; worse on right.  Skin: Warm, dry without rashes, lesions, ecchymosis.  Neuro: Cranial nerves intact. Normal muscle tone, no cerebellar symptoms. Sensation intact.  Psych: Awake and oriented X 3, normal affect, Insight and Judgment appropriate.     Dan Maker, NP 11:50 AM [facility]

## 2017-06-29 ENCOUNTER — Encounter: Payer: Self-pay | Admitting: Adult Health

## 2017-06-29 ENCOUNTER — Ambulatory Visit: Payer: 59 | Admitting: Adult Health

## 2017-06-29 DIAGNOSIS — I10 Essential (primary) hypertension: Secondary | ICD-10-CM

## 2017-06-29 DIAGNOSIS — G5603 Carpal tunnel syndrome, bilateral upper limbs: Secondary | ICD-10-CM | POA: Insufficient documentation

## 2017-06-29 DIAGNOSIS — M5441 Lumbago with sciatica, right side: Secondary | ICD-10-CM | POA: Diagnosis not present

## 2017-06-29 DIAGNOSIS — G4700 Insomnia, unspecified: Secondary | ICD-10-CM

## 2017-06-29 MED ORDER — MELOXICAM 15 MG PO TABS: 15.0000 mg | ORAL_TABLET | Freq: Every day | ORAL | 0 refills | Status: DC

## 2017-06-29 NOTE — Patient Instructions (Signed)
Try Lose it! App or myfitness Pal tracking for 4 weeks  Try to work on getting in fiber, protein and healthy fat with each meal/snack   Carpal Tunnel Syndrome Carpal tunnel syndrome is a condition that causes pain in your hand and arm. The carpal tunnel is a narrow area located on the palm side of your wrist. Repeated wrist motion or certain diseases may cause swelling within the tunnel. This swelling pinches the main nerve in the wrist (median nerve). What are the causes? This condition may be caused by:  Repeated wrist motions.  Wrist injuries.  Arthritis.  A cyst or tumor in the carpal tunnel.  Fluid buildup during pregnancy.  Sometimes the cause of this condition is not known. What increases the risk? This condition is more likely to develop in:  People who have jobs that cause them to repeatedly move their wrists in the same motion, such as Health visitor.  Women.  People with certain conditions, such as: ? Diabetes. ? Obesity. ? An underactive thyroid (hypothyroidism). ? Kidney failure.  What are the signs or symptoms? Symptoms of this condition include:  A tingling feeling in your fingers, especially in your thumb, index, and middle fingers.  Tingling or numbness in your hand.  An aching feeling in your entire arm, especially when your wrist and elbow are bent for long periods of time.  Wrist pain that goes up your arm to your shoulder.  Pain that goes down into your palm or fingers.  A weak feeling in your hands. You may have trouble grabbing and holding items.  Your symptoms may feel worse during the night. How is this diagnosed? This condition is diagnosed with a medical history and physical exam. You may also have tests, including:  An electromyogram (EMG). This test measures electrical signals sent by your nerves into the muscles.  X-rays.  How is this treated? Treatment for this condition includes:  Lifestyle changes. It is important to  stop doing or modify the activity that caused your condition.  Physical or occupational therapy.  Medicines for pain and inflammation. This may include medicine that is injected into your wrist.  A wrist splint.  Surgery.  Follow these instructions at home: If you have a splint:  Wear it as told by your health care provider. Remove it only as told by your health care provider.  Loosen the splint if your fingers become numb and tingle, or if they turn cold and blue.  Keep the splint clean and dry. General instructions  Take over-the-counter and prescription medicines only as told by your health care provider.  Rest your wrist from any activity that may be causing your pain. If your condition is work related, talk to your employer about changes that can be made, such as getting a wrist pad to use while typing.  If directed, apply ice to the painful area: ? Put ice in a plastic bag. ? Place a towel between your skin and the bag. ? Leave the ice on for 20 minutes, 2-3 times per day.  Keep all follow-up visits as told by your health care provider. This is important.  Do any exercises as told by your health care provider, physical therapist, or occupational therapist. Contact a health care provider if:  You have new symptoms.  Your pain is not controlled with medicines.  Your symptoms get worse. This information is not intended to replace advice given to you by your health care provider. Make sure you discuss any  questions you have with your health care provider. Document Released: 01/15/2000 Document Revised: 05/28/2015 Document Reviewed: 06/04/2014 Elsevier Interactive Patient Education  [employer].

## 2017-07-05 ENCOUNTER — Other Ambulatory Visit: Payer: Self-pay | Admitting: Physician Assistant

## 2017-07-09 ENCOUNTER — Other Ambulatory Visit: Payer: Self-pay | Admitting: Internal Medicine

## 2017-07-16 ENCOUNTER — Other Ambulatory Visit: Payer: Self-pay | Admitting: Internal Medicine

## 2017-07-22 ENCOUNTER — Other Ambulatory Visit: Payer: Self-pay | Admitting: Internal Medicine

## 2017-07-22 MED ORDER — MONTELUKAST SODIUM 10 MG PO TABS: ORAL_TABLET | ORAL | 3 refills | Status: DC

## 2017-09-03 ENCOUNTER — Other Ambulatory Visit: Payer: Self-pay | Admitting: Internal Medicine

## 2017-09-19 ENCOUNTER — Other Ambulatory Visit: Payer: Self-pay | Admitting: Physician Assistant

## 2017-09-22 ENCOUNTER — Other Ambulatory Visit: Payer: Self-pay | Admitting: Adult Health

## 2017-09-22 DIAGNOSIS — M5441 Lumbago with sciatica, right side: Secondary | ICD-10-CM

## 2017-09-30 DIAGNOSIS — M79672 Pain in left foot: Secondary | ICD-10-CM | POA: Diagnosis not present

## 2017-09-30 DIAGNOSIS — M7752 Other enthesopathy of left foot: Secondary | ICD-10-CM | POA: Diagnosis not present

## 2017-09-30 DIAGNOSIS — Z6838 Body mass index (BMI) 38.0-38.9, adult: Secondary | ICD-10-CM | POA: Diagnosis not present

## 2017-10-10 NOTE — Progress Notes (Signed)
Assessment and Plan:  Essential hypertension - continue medications, DASH diet, exercise and monitor at home. Call if greater than 130/80.  -     CBC with Differential/Platelet -     COMPLETE METABOLIC PANEL WITH GFR -     TSH  Mixed hyperlipidemia check lipids decrease fatty foods increase activity.  -     Lipid panel  Morbid obesity (HCC) Continue weight loss, continue walking -     Hemoglobin A1c  Insomnia, unspecified type -     Eszopiclone (ESZOPICLONE) 3 MG TABS; Take 1 tablet (3 mg total) by mouth at bedtime as needed. Take immediately before bedtime - stop trazodone, ? Serotonin syndrome - good hygeine discussed  Recurrent major depressive disorder, in partial remission (HCC) -     escitalopram (LEXAPRO) 20 MG tablet; Take 1 tablet (20 mg total) by mouth daily.  feels zoloft not as effective, ? Hot flashes versus serotinin syndrome- will switch zoloft to lexapro  Follow up 2 months Message 1 month  Further disposition pending results of labs. Discussed med's effects and SE's.   Over 30 minutes of exam, counseling, chart review, and critical decision making was performed.   Future Appointments  Date Time Provider Department Center  03/06/2018 10:00 AM Quentin Mulling, PA-C [facility] None    ------------------------------------------------------------------------------------------------------------------   HPI BP 138/90   Pulse 61   Temp (!) 97.4 F (36.3 C)   Resp 16   Ht 5\' 1"  (1.549 m)   Wt 201 lb (91.2 kg)   SpO2 95%   BMI 37.98 kg/m   47 y.o.female presents for follow up for weight loss.   She has been having hot flashes, night sweats. She if off phentermine. She has been on trazodone for several months with zoloft and wellbutrin. She has trouble sleeping, has been on traozodone and ambien without help.    BMI is Body mass index is 37.98 kg/m., she is working on diet and exercise. Wt Readings from Last 3 Encounters:  10/12/17 201 lb (91.2 kg)   06/29/17 215 lb (97.5 kg)  05/24/17 214 lb 12.8 oz (97.4 kg)   Her blood pressure has been controlled at home, today their BP is BP: 138/90  She does workout, got up to 3 miles, going to see trainer at [employer]. Injured her left foot with running. She denies chest pain, shortness of breath, dizziness. .  She is on cholesterol medication and denies myalgias. Her cholesterol is not at goal. The cholesterol last visit was:   Lab Results  Component Value Date   CHOL 247 (H) 05/24/2017   HDL 90 05/24/2017   LDLCALC 126 (H) 05/24/2017   TRIG 192 (H) 05/24/2017   CHOLHDL 2.7 05/24/2017    Patient is on Vitamin D supplement.   Lab Results  Component Value Date   VD25OH 27 (L) 02/22/2017       Past Medical History:  Diagnosis Date  . Anxiety   . Depression    no meds  . Hyperlipidemia   . Hypertension    no meds     Allergies  Allergen Reactions  . Erythromycin Nausea Only and Other (See Comments)    Stomach cramping (can take Z Pak)  . Flexeril [Cyclobenzaprine Hcl] Other (See Comments)    "don't like the way I feel"  . Metformin And Related     GI Upset    Current Outpatient Medications on File Prior to Visit  Medication Sig  . albuterol (VENTOLIN HFA) 108 (90 Base)  MCG/ACT inhaler Inhale 2 puffs into the lungs every 4 (four) hours as needed for wheezing or shortness of breath.  Marland Kitchen aspirin 81 MG tablet Take 81 mg by mouth daily.  . baclofen (LIORESAL) 10 MG tablet Take 1 tablet (10 mg total) by mouth 2 (two) times daily. Take 1/2 to 1 tablet 2 x day if needed for muscle spasm  . BIOTIN PO Take by mouth.  Marland Kitchen buPROPion (WELLBUTRIN XL) 300 MG 24 hr tablet Take 1 tablet (300 mg total) by mouth every morning.  . Cholecalciferol (VITAMIN D) 2000 UNITS tablet Take 2,000 Units by mouth daily. Pt takes Finest Nutrition Bone Health  . Cyanocobalamin (VITAMIN B 12) 100 MCG LOZG Take by mouth.  Marland Kitchen ipratropium (ATROVENT) 0.03 % nasal spray PLACE 2 SPRAYS INTO THE NOSE 3 (THREE)  TIMES DAILY.  . magnesium 30 MG tablet Take 30 mg by mouth 2 (two) times daily. Takes twice a week  . meloxicam (MOBIC) 15 MG tablet TAKE 1 TABLET BY MOUTH EVERY DAY  . montelukast (SINGULAIR) 10 MG tablet Take 1 tablet daily for Allergies  . norgestimate-ethinyl estradiol (SPRINTEC 28) 0.25-35 MG-MCG tablet Take 1 tablet by mouth daily.  Marland Kitchen nystatin cream (MYCOSTATIN) Apply 1 application topically 2 (two) times daily.  Marland Kitchen omeprazole (PRILOSEC) 40 MG capsule TAKE 1 CAPSULE BY MOUTH DAILY  . OVER THE COUNTER MEDICATION Patient takes 1 gummy multivitamin daily.  Marland Kitchen OVER THE COUNTER MEDICATION Takes OTC Zyrtec 10 mg PRN.  Marland Kitchen phentermine (ADIPEX-P) 37.5 MG tablet Take 1 tablet (37.5 mg total) by mouth daily before breakfast.  . pravastatin (PRAVACHOL) 40 MG tablet TAKE 1 TABLET BY MOUTH EVERY DAY  . ranitidine (ZANTAC) 300 MG tablet Take 1/2-1 tab as needed at night.  . sertraline (ZOLOFT) 100 MG tablet TAKE 1 TABLET BY MOUTH EVERY DAY  . telmisartan (MICARDIS) 40 MG tablet TAKE 1 TABLET BY MOUTH  DAILY  . traZODone (DESYREL) 100 MG tablet TAKE 1 TABLET (100 MG TOTAL) BY MOUTH AT BEDTIME.  Marland Kitchen triamcinolone ointment (KENALOG) 0.1 % Apply 1 application topically 2 (two) times daily.  . fluticasone (FLONASE) 50 MCG/ACT nasal spray Place 2 sprays into both nostrils daily.   No current facility-administered medications on file prior to visit.     ROS: Review of Systems  Constitutional: Negative for chills, fever, malaise/fatigue and weight loss.  HENT: Negative for hearing loss and tinnitus.   Eyes: Negative for blurred vision and double vision.  Respiratory: Negative for cough, shortness of breath and wheezing.   Cardiovascular: Negative for chest pain, palpitations, orthopnea, claudication and leg swelling.  Gastrointestinal: Negative for abdominal pain, blood in stool, constipation, diarrhea, heartburn, melena, nausea and vomiting.  Genitourinary: Negative.   Musculoskeletal: Positive for back  pain. Negative for joint pain, myalgias and neck pain.  Skin: Negative for rash.  Neurological: Positive for tingling (Bilateral hands at night and dependent to gravity). Negative for dizziness, sensory change, weakness and headaches.  Endo/Heme/Allergies: Negative for polydipsia.  Psychiatric/Behavioral: Negative.   All other systems reviewed and are negative.   Physical Exam:  BP 138/90   Pulse 61   Temp (!) 97.4 F (36.3 C)   Resp 16   Ht 5\' 1"  (1.549 m)   Wt 201 lb (91.2 kg)   SpO2 95%   BMI 37.98 kg/m   General Appearance: Well nourished, in no apparent distress. Eyes: PERRLA, EOMs, conjunctiva no swelling or erythema ENT/Mouth: No erythema, swelling, or exudate on post pharynx.  Tonsils not swollen or  erythematous. Hearing normal.  Neck: Supple, thyroid normal.  Respiratory: Respiratory effort normal, BS equal bilaterally without rales, rhonchi, wheezing or stridor.  Cardio: RRR with no MRGs. Brisk peripheral pulses without edema.  Abdomen: Soft, + BS.  Non tender, no guarding, rebound, hernias, masses. Lymphatics: Non tender without lymphadenopathy.  Musculoskeletal: Full ROM, 5/5 strength, normal gait. Bilateral phalen's; worse on right.  Skin: Warm, dry without rashes, lesions, ecchymosis.  Neuro: Cranial nerves intact. Normal muscle tone, no cerebellar symptoms. Sensation intact.  Psych: Awake and oriented X 3, normal affect, Insight and Judgment appropriate.     Quentin Mulling, PA-C 3:47 PM [facility]

## 2017-10-12 ENCOUNTER — Encounter: Payer: Self-pay | Admitting: Physician Assistant

## 2017-10-12 ENCOUNTER — Ambulatory Visit (INDEPENDENT_AMBULATORY_CARE_PROVIDER_SITE_OTHER): Payer: 59 | Admitting: Physician Assistant

## 2017-10-12 ENCOUNTER — Ambulatory Visit: Payer: Self-pay | Admitting: Physician Assistant

## 2017-10-12 VITALS — BP 138/90 | HR 61 | Temp 97.4°F | Resp 16 | Ht 61.0 in | Wt 201.0 lb

## 2017-10-12 DIAGNOSIS — G4700 Insomnia, unspecified: Secondary | ICD-10-CM | POA: Diagnosis not present

## 2017-10-12 DIAGNOSIS — I10 Essential (primary) hypertension: Secondary | ICD-10-CM

## 2017-10-12 DIAGNOSIS — E782 Mixed hyperlipidemia: Secondary | ICD-10-CM | POA: Diagnosis not present

## 2017-10-12 DIAGNOSIS — F3341 Major depressive disorder, recurrent, in partial remission: Secondary | ICD-10-CM

## 2017-10-12 MED ORDER — ESCITALOPRAM OXALATE 20 MG PO TABS: 20.0000 mg | ORAL_TABLET | Freq: Every day | ORAL | 2 refills | Status: DC

## 2017-10-12 MED ORDER — ESZOPICLONE 3 MG PO TABS: 3.0000 mg | ORAL_TABLET | Freq: Every evening | ORAL | 3 refills | Status: DC | PRN

## 2017-10-12 NOTE — Patient Instructions (Addendum)
INFORMATION ABOUT CBD OIL  The studies for CBD are better for seizure disorders than any other claims for CBD oil.  The biggest issues with it, is CBD oil is NOT regulated. A recent test of 18 over the counter CBD oil showed that 20% had TOO much, 60 % did not have the claimed amount, and 20 % had the claimed amount.   I have had some patients where it interacts with their medications as well and there is not a way for me to check the interactions since it is not a controlled substance.   With any other the counter medication OR supplement, if you try it, try to get from good source and if you have ANY abnormal symptoms over the next 3 months or don't see any benefits from it stop it.   The quality and quanity of the CBD oil varies greatly so I can not endorse patients taking it at this time.   Stop the trazodone  Start on lexparo and stop the zoloft Try the lunesta for sleep  Check out  Mini habits for weight loss book  2 apps for tracking food is myfitness pal  loseit OR can take picture of your food Check out mindful eating  Serotonin Syndrome Serotonin is a brain chemical that regulates the nervous system, which includes the brain, spinal cord, and nerves. Serotonin appears to play a role in all types of behavior, including appetite, emotions, movement, thinking, and response to stress. Excessively high levels of serotonin in the body can cause serotonin syndrome, which is a very dangerous condition. What are the causes? This condition can be caused by taking medicines or drugs that increase the level of serotonin in your body. These include:  Antidepressant medicines.  Migraine medicines.  Certain pain medicines.  Certain recreational drugs, including ecstasy, LSD, cocaine, and amphetamines.  Over-the-counter cough or cold medicines that contain dextromethorphan.  Certain herbal supplements, including St. John's wort, ginseng, and nutmeg.  This condition usually occurs  when you take these medicines or drugs in combination, but it can also happen with a high dose of a single medicine or drug. What increases the risk? This condition is more likely to develop in:  People who have recently increased the dosage of medicine that increases the serotonin level.  People who just started taking medicine that increases the serotonin level.  What are the signs or symptoms? Symptoms of this condition usually happens within several hours of a medicine change. Symptoms include:  Headache.  Muscle twitching or stiffness.  Diarrhea.  Confusion.  Restlessness or agitation.  Shivering or goose bumps.  Loss of muscle coordination.  Rapid heart rate.  Sweating.  Severe cases of serotonin syndromecan cause:  Irregular heartbeat.  Seizures.  Loss of consciousness.  High fever.  How is this diagnosed? This condition is diagnosed with a medical history and physical exam. You will be asked aboutyour symptoms and your use of medicines and recreational drugs. Your health care provider may also order lab work or additional tests to rule out other causes of your symptoms. How is this treated? The treatment for this condition depends on the severity of your symptoms. For mild cases, stopping the medicine that caused your condition is usually all that is needed. For moderate to severe cases, hospitalization is required to monitor you and to prevent further muscle damage. Follow these instructions at home:  Take over-the-counter and prescription medicines only as told by your health care provider. This is important.  Check  with your health care provider before you start taking any new prescriptions, over-the-counter medicines, herbs, or supplements.  Avoid combining any medicines that can cause this condition to occur.  Keep all follow-up visits as told by your health care provider.This is important.  Maintain a healthy lifestyle. ? Eat healthy  foods. ? Get plenty of sleep. ? Exercise regularly. ? Do not drink alcohol. ? Do not use recreational drugs. Contact a health care provider if:  Medicines do not seem to be helping.  Your symptoms do not improve or they get worse.  You have trouble taking care of yourself. Get help right away if:  You have worsening confusion, severe headache, chest pain, high fever, seizures, or loss of consciousness.  You have serious thoughts about hurting yourself or others.  You experience serious side effects of medicine, such as swelling of your face, lips, tongue, or throat. This information is not intended to replace advice given to you by your health care provider. Make sure you discuss any questions you have with your health care provider. Document Released: 02/25/2004 Document Revised: 09/12/2015 Document Reviewed: 01/30/2014 Elsevier Interactive Patient Education  [employer]2018 [employer] Inc.  11 Tips to Follow:  1. No caffeine after 3pm: Avoid beverages with caffeine (soda, tea, energy drinks, etc.) especially after 3pm. 2. Don't go to bed hungry: Have your evening meal at least 3 hrs. before going to sleep. It's fine to have a small bedtime snack such as a glass of milk and a few crackers but don't have a big meal. 3. Have a nightly routine before bed: Plan on "winding down" before you go to sleep. Begin relaxing about 1 hour before you go to bed. Try doing a quiet activity such as listening to calming music, reading a book or meditating. 4. Turn off the TV and ALL electronics including video games, tablets, laptops, etc. 1 hour before sleep, and keep them out of the bedroom. 5. Turn off your cell phone and all notifications (new email and text alerts) or even better, leave your phone outside your room while you sleep. Studies have shown that a part of your brain continues to respond to certain lights and sounds even while you're still asleep. 6. Make your bedroom quiet, dark and cool. If you can't  control the noise, try wearing earplugs or using a fan to block out other sounds. 7. Practice relaxation techniques. Try reading a book or meditating or drain your brain by writing a list of what you need to do the next day. 8. Don't nap unless you feel sick: you'll have a better night's sleep. 9. Don't smoke, or quit if you do. Nicotine, alcohol, and marijuana can all keep you awake. Talk to your health care provider if you need help with substance use. 10. Most importantly, wake up at the same time every day (or within 1 hour of your usual wake up time) EVEN on the weekends. A regular wake up time promotes sleep hygiene and prevents sleep problems. 11. Reduce exposure to bright light in the last three hours of the day before going to sleep. Maintaining good sleep hygiene and having good sleep habits lower your risk of developing sleep problems. Getting better sleep can also improve your concentration and alertness. Try the simple steps in this guide. If you still have trouble getting enough rest, make an appointment with your health care provider.

## 2017-10-13 LAB — COMPLETE METABOLIC PANEL WITH GFR
AG RATIO: 1.4 (calc) (ref 1.0–2.5)
ALT: 15 U/L (ref 6–29)
AST: 17 U/L (ref 10–35)
Albumin: 4.2 g/dL (ref 3.6–5.1)
Alkaline phosphatase (APISO): 51 U/L (ref 33–115)
BUN/Creatinine Ratio: 9 (calc) (ref 6–22)
BUN: 10 mg/dL (ref 7–25)
CALCIUM: 9.8 mg/dL (ref 8.6–10.2)
CO2: 26 mmol/L (ref 20–32)
Chloride: 102 mmol/L (ref 98–110)
Creat: 1.15 mg/dL — ABNORMAL HIGH (ref 0.50–1.10)
GFR, EST AFRICAN AMERICAN: 66 mL/min/{1.73_m2} (ref >=60)
GFR, EST NON AFRICAN AMERICAN: 57 mL/min/{1.73_m2} — AB (ref >=60)
GLUCOSE: 85 mg/dL (ref 65–99)
Globulin: 3 g/dL (calc) (ref 1.9–3.7)
POTASSIUM: 4.9 mmol/L (ref 3.5–5.3)
Sodium: 139 mmol/L (ref 135–146)
TOTAL PROTEIN: 7.2 g/dL (ref 6.1–8.1)
Total Bilirubin: 0.4 mg/dL (ref 0.2–1.2)

## 2017-10-13 LAB — CBC WITH DIFFERENTIAL/PLATELET
Basophils Absolute: 21 cells/uL (ref 0–200)
Basophils Relative: 0.4 %
EOS PCT: 0.6 %
Eosinophils Absolute: 32 cells/uL (ref 15–500)
HCT: 40.2 % (ref 35.0–45.0)
Hemoglobin: 13.4 g/dL (ref 11.7–15.5)
Lymphs Abs: 1632 cells/uL (ref 850–3900)
MCH: 31.2 pg (ref 27.0–33.0)
MCHC: 33.3 g/dL (ref 32.0–36.0)
MCV: 93.7 fL (ref 80.0–100.0)
MPV: 9.4 fL (ref 7.5–12.5)
Monocytes Relative: 10.1 %
NEUTROS PCT: 58.1 %
Neutro Abs: 3079 cells/uL (ref 1500–7800)
PLATELETS: 317 10*3/uL (ref 140–400)
RBC: 4.29 10*6/uL (ref 3.80–5.10)
RDW: 12.6 % (ref 11.0–15.0)
TOTAL LYMPHOCYTE: 30.8 %
WBC mixed population: 535 cells/uL (ref 200–950)
WBC: 5.3 10*3/uL (ref 3.8–10.8)

## 2017-10-13 LAB — TSH: TSH: 2.19 m[IU]/L

## 2017-10-13 LAB — LIPID PANEL
CHOLESTEROL: 241 mg/dL — AB (ref <200)
HDL: 89 mg/dL (ref >50)
LDL Cholesterol (Calc): 118 mg/dL (calc) — ABNORMAL HIGH
Non-HDL Cholesterol (Calc): 152 mg/dL (calc) — ABNORMAL HIGH (ref <130)
TRIGLYCERIDES: 216 mg/dL — AB (ref <150)
Total CHOL/HDL Ratio: 2.7 (calc) (ref <5.0)

## 2017-10-13 LAB — HEMOGLOBIN A1C
EAG (MMOL/L): 5.8 (calc)
Hgb A1c MFr Bld: 5.3 % of total Hgb (ref <5.7)
Mean Plasma Glucose: 105 (calc)

## 2017-10-16 DIAGNOSIS — L821 Other seborrheic keratosis: Secondary | ICD-10-CM | POA: Diagnosis not present

## 2017-10-16 DIAGNOSIS — L918 Other hypertrophic disorders of the skin: Secondary | ICD-10-CM | POA: Diagnosis not present

## 2017-10-16 DIAGNOSIS — L820 Inflamed seborrheic keratosis: Secondary | ICD-10-CM | POA: Diagnosis not present

## 2017-10-16 DIAGNOSIS — B078 Other viral warts: Secondary | ICD-10-CM | POA: Diagnosis not present

## 2017-10-17 ENCOUNTER — Other Ambulatory Visit: Payer: Self-pay | Admitting: Obstetrics & Gynecology

## 2017-10-26 DIAGNOSIS — J209 Acute bronchitis, unspecified: Secondary | ICD-10-CM | POA: Diagnosis not present

## 2017-10-27 ENCOUNTER — Ambulatory Visit: Payer: Self-pay | Admitting: Adult Health

## 2017-11-08 DIAGNOSIS — R05 Cough: Secondary | ICD-10-CM | POA: Diagnosis not present

## 2017-11-08 DIAGNOSIS — R0602 Shortness of breath: Secondary | ICD-10-CM | POA: Diagnosis not present

## 2017-11-23 ENCOUNTER — Other Ambulatory Visit: Payer: Self-pay | Admitting: Physician Assistant

## 2017-11-23 ENCOUNTER — Encounter: Payer: Self-pay | Admitting: Internal Medicine

## 2017-11-23 DIAGNOSIS — R05 Cough: Secondary | ICD-10-CM

## 2017-11-23 DIAGNOSIS — R059 Cough, unspecified: Secondary | ICD-10-CM

## 2017-11-23 DIAGNOSIS — J986 Disorders of diaphragm: Secondary | ICD-10-CM

## 2017-11-23 DIAGNOSIS — R9389 Abnormal findings on diagnostic imaging of other specified body structures: Secondary | ICD-10-CM

## 2017-11-23 DIAGNOSIS — R1907 Generalized intra-abdominal and pelvic swelling, mass and lump: Secondary | ICD-10-CM

## 2017-11-23 NOTE — Progress Notes (Signed)
Patient went to UC at [facility] in [place]. Had CXR for persistent cough.  that showed right hemidiaphragm and possible underlying mass with suggestion of CT chest and AB to evaluate.

## 2017-11-24 ENCOUNTER — Other Ambulatory Visit: Payer: Self-pay | Admitting: Physician Assistant

## 2017-11-24 ENCOUNTER — Encounter: Payer: Self-pay | Admitting: Physician Assistant

## 2017-11-24 DIAGNOSIS — R1907 Generalized intra-abdominal and pelvic swelling, mass and lump: Secondary | ICD-10-CM

## 2017-11-24 DIAGNOSIS — R9389 Abnormal findings on diagnostic imaging of other specified body structures: Secondary | ICD-10-CM

## 2017-11-29 ENCOUNTER — Other Ambulatory Visit: Payer: Self-pay | Admitting: Internal Medicine

## 2017-11-30 ENCOUNTER — Ambulatory Visit (HOSPITAL_COMMUNITY)
Admission: RE | Admit: 2017-11-30 | Discharge: 2017-11-30 | Disposition: A | Discharge status: Home / Self Care | Payer: 59 | Source: Ambulatory Visit | Attending: Physician Assistant | Admitting: Physician Assistant

## 2017-11-30 DIAGNOSIS — K8020 Calculus of gallbladder without cholecystitis without obstruction: Secondary | ICD-10-CM | POA: Diagnosis not present

## 2017-11-30 DIAGNOSIS — R1907 Generalized intra-abdominal and pelvic swelling, mass and lump: Secondary | ICD-10-CM | POA: Diagnosis not present

## 2017-11-30 DIAGNOSIS — R9389 Abnormal findings on diagnostic imaging of other specified body structures: Secondary | ICD-10-CM | POA: Diagnosis not present

## 2017-11-30 DIAGNOSIS — R05 Cough: Secondary | ICD-10-CM | POA: Insufficient documentation

## 2017-11-30 DIAGNOSIS — N200 Calculus of kidney: Secondary | ICD-10-CM | POA: Insufficient documentation

## 2017-11-30 DIAGNOSIS — J986 Disorders of diaphragm: Secondary | ICD-10-CM | POA: Diagnosis not present

## 2017-11-30 MED ORDER — IOHEXOL 300 MG/ML  SOLN
100.0000 mL | Freq: Once | INTRAMUSCULAR | Status: AC | PRN
  Administered 2017-11-30: 100 mL via INTRAVENOUS

## 2017-11-30 MED ORDER — SODIUM CHLORIDE 0.9 % IJ SOLN
INTRAMUSCULAR | Status: AC
  Filled 2017-11-30: qty 50

## 2017-12-01 ENCOUNTER — Other Ambulatory Visit: Payer: Self-pay | Admitting: Physician Assistant

## 2017-12-01 DIAGNOSIS — F3341 Major depressive disorder, recurrent, in partial remission: Secondary | ICD-10-CM

## 2017-12-01 DIAGNOSIS — F330 Major depressive disorder, recurrent, mild: Secondary | ICD-10-CM

## 2017-12-04 ENCOUNTER — Other Ambulatory Visit: Payer: Self-pay | Admitting: Physician Assistant

## 2017-12-04 DIAGNOSIS — Q791 Other congenital malformations of diaphragm: Secondary | ICD-10-CM

## 2017-12-04 DIAGNOSIS — R0602 Shortness of breath: Secondary | ICD-10-CM

## 2017-12-11 NOTE — Progress Notes (Signed)
Assessment and Plan:  Shortness of breath ? If from her diaphragm or not Going to follow up with pulmonary in Jan Suggest PFTs to look for restrictive pattern  Hemidiaphragmatic eventration Monitor symptoms Discussed if not causing symptoms than we will leave alone and is like more part of her anatomy.   RUQ pain -     US Abdomen Complete; Future - if negative may get HIDA  Future Appointments  Date Time Provider Department Center  01/15/2018  2:30 PM Genia Del, MD [facility]  02/16/2018 11:00 AM Lupita Leash, MD [facility] None  03/06/2018 10:00 AM Quentin Mulling, PA-C [facility] None     HPI 47 y.o.female presents for follow up for change in medications.  She was complaining of hot flashes and insomnia last time, given lexapro and lunesta. She states hot flashes are much better and her sleep is doing well, still some irritability but much improved from last visit.  BMI is Body mass index is 37.34 kg/m., she is working on diet and exercise. Wt Readings from Last 3 Encounters:  12/13/17 197 lb 9.6 oz (89.6 kg)  10/12/17 201 lb (91.2 kg)  06/29/17 215 lb (97.5 kg)    Since that time she had a CXR at a UC for SOB, had abnormal CXR that lead to CT chest/AB that showed: IMPRESSION: 1. Significant elevation of the right hemidiaphragm as a result of focal eventration anteriorly with much of the liver extending cephalad compressing the right lung base and displacing the heart to the left of midline. However no mass or adenopathy is seen. 2. No lung infiltrate or suspicious nodule is seen. No pleural effusion is noted. 3. Peripherally calcified gallstone within the decompressed gallbladder measuring 2.2 cm. 4. Bilateral renal calculi the largest of 5 mm in diameter on the right. No hydronephrosis.  She has had right hemidiaphragm elevation x 2016 or longer. She states she does have SOB with exertion which has been "normal" for her. She has had RUQ pain, has  had bronchitis and exercising at gym so could be pulled muscle, has improved. Has never had RUQ pain with food. She will occ get chest pain, last for seconds, non exertional/not with food, with sitting, no accompaniments. No palpitations.   Past Medical History:  Diagnosis Date  . Anxiety   . Depression    no meds  . Hyperlipidemia   . Hypertension    no meds     Allergies  Allergen Reactions  . Erythromycin Nausea Only and Other (See Comments)    Stomach cramping (can take Z Pak)  . Flexeril [Cyclobenzaprine Hcl] Other (See Comments)    "don't like the way I feel"  . Metformin And Related     GI Upset    Current Outpatient Medications on File Prior to Visit  Medication Sig  . albuterol (VENTOLIN HFA) 108 (90 Base) MCG/ACT inhaler Inhale 2 puffs into the lungs every 4 (four) hours as needed for wheezing or shortness of breath.  Marland Kitchen aspirin 81 MG tablet Take 81 mg by mouth daily.  . baclofen (LIORESAL) 10 MG tablet Take 1 tablet (10 mg total) by mouth 2 (two) times daily. Take 1/2 to 1 tablet 2 x day if needed for muscle spasm  . BIOTIN PO Take by mouth.  Marland Kitchen buPROPion (WELLBUTRIN XL) 300 MG 24 hr tablet TAKE 1 TABLET BY MOUTH EVERY DAY IN THE MORNING  . Cholecalciferol (VITAMIN D) 2000 UNITS tablet Take 2,000 Units by mouth daily. Pt takes [employer]   Nutrition Bone Health  . Cyanocobalamin (VITAMIN B 12) 100 MCG LOZG Take by mouth.  . escitalopram (LEXAPRO) 20 MG tablet TAKE 1 TABLET BY MOUTH EVERY DAY  . Eszopiclone (ESZOPICLONE) 3 MG TABS Take 1 tablet (3 mg total) by mouth at bedtime as needed. Take immediately before bedtime  . fluticasone (FLONASE) 50 MCG/ACT nasal spray Place 2 sprays into both nostrils daily.  Marland Kitchen ipratropium (ATROVENT) 0.03 % nasal spray PLACE 2 SPRAYS INTO THE NOSE 3 (THREE) TIMES DAILY.  . meloxicam (MOBIC) 15 MG tablet TAKE 1 TABLET BY MOUTH EVERY DAY  . montelukast (SINGULAIR) 10 MG tablet Take 1 tablet daily for Allergies  . nystatin cream (MYCOSTATIN) Apply 1  application topically 2 (two) times daily.  Marland Kitchen omeprazole (PRILOSEC) 40 MG capsule TAKE 1 CAPSULE BY MOUTH DAILY  . OVER THE COUNTER MEDICATION Patient takes 1 gummy multivitamin daily.  Marland Kitchen OVER THE COUNTER MEDICATION Takes OTC Zyrtec 10 mg PRN.  . pravastatin (PRAVACHOL) 40 MG tablet TAKE 1 TABLET BY MOUTH EVERY DAY  . SPRINTEC 28 0.25-35 MG-MCG tablet TAKE 1 TABLET BY MOUTH EVERY DAY  . telmisartan (MICARDIS) 40 MG tablet TAKE 1 TABLET BY MOUTH  DAILY  . triamcinolone ointment (KENALOG) 0.1 % Apply 1 application topically 2 (two) times daily.   No current facility-administered medications on file prior to visit.     ROS: all negative except above.   Physical Exam: Filed Weights   12/13/17 1547  Weight: 197 lb 9.6 oz (89.6 kg)   BP 128/80   Pulse 68   Temp (!) 97.4 F (36.3 C)   Resp 16   Ht 5\' 1"  (1.549 m)   Wt 197 lb 9.6 oz (89.6 kg)   SpO2 95%   BMI 37.34 kg/m  General Appearance: Well nourished, in no apparent distress. Eyes: PERRLA, EOMs, conjunctiva no swelling or erythema Sinuses: No Frontal/maxillary tenderness ENT/Mouth: Ext aud canals clear, TMs without erythema, bulging. No erythema, swelling, or exudate on post pharynx.  Tonsils not swollen or erythematous. Hearing normal.  Neck: Supple, thyroid normal.  Respiratory: Respiratory effort normal, BS decreased RLL, without rales, rhonchi, wheezing or stridor.  Cardio: RRR with no MRGs. Brisk peripheral pulses without edema.  Abdomen: Soft, + BS.  + RUQ tenderness with palpable liver edge more midline, no guarding, rebound, hernias. Lymphatics: Non tender without lymphadenopathy.  Musculoskeletal: Full ROM, 5/5 strength, normal gait.  Skin: Warm, dry without rashes, lesions, ecchymosis.  Neuro: Cranial nerves intact. Normal muscle tone, no cerebellar symptoms.  Psych: Awake and oriented X 3, normal affect, Insight and Judgment appropriate.     Quentin Mulling, PA-C 3:52 PM [facility]

## 2017-12-13 ENCOUNTER — Encounter: Payer: Self-pay | Admitting: Physician Assistant

## 2017-12-13 ENCOUNTER — Ambulatory Visit (INDEPENDENT_AMBULATORY_CARE_PROVIDER_SITE_OTHER): Payer: 59 | Admitting: Physician Assistant

## 2017-12-13 VITALS — BP 128/80 | HR 68 | Temp 97.4°F | Resp 16 | Ht 61.0 in | Wt 197.6 lb

## 2017-12-13 DIAGNOSIS — R0602 Shortness of breath: Secondary | ICD-10-CM

## 2017-12-13 DIAGNOSIS — R1011 Right upper quadrant pain: Secondary | ICD-10-CM | POA: Diagnosis not present

## 2017-12-13 DIAGNOSIS — Q791 Other congenital malformations of diaphragm: Secondary | ICD-10-CM

## 2017-12-13 NOTE — Patient Instructions (Signed)
  Google mindful eating and here are some tips and tricks below.   Rate your hunger before you eat on a scale of 1-10, try to eat closer to a 6 or higher. And if you are at below that, why are you eating? Slow down and listen to your body.         

## 2017-12-15 ENCOUNTER — Other Ambulatory Visit: Payer: 59

## 2017-12-21 ENCOUNTER — Ambulatory Visit
Admission: RE | Admit: 2017-12-21 | Discharge: 2017-12-21 | Disposition: A | Discharge status: Home / Self Care | Payer: 59 | Source: Ambulatory Visit | Attending: Physician Assistant | Admitting: Physician Assistant

## 2017-12-21 ENCOUNTER — Other Ambulatory Visit: Payer: Self-pay | Admitting: Physician Assistant

## 2017-12-21 DIAGNOSIS — R112 Nausea with vomiting, unspecified: Secondary | ICD-10-CM

## 2017-12-21 DIAGNOSIS — R1011 Right upper quadrant pain: Secondary | ICD-10-CM

## 2017-12-21 DIAGNOSIS — K8020 Calculus of gallbladder without cholecystitis without obstruction: Secondary | ICD-10-CM | POA: Diagnosis not present

## 2018-01-04 ENCOUNTER — Other Ambulatory Visit: Payer: Self-pay

## 2018-01-04 MED ORDER — NORGESTIMATE-ETH ESTRADIOL 0.25-35 MG-MCG PO TABS: 1.0000 | ORAL_TABLET | Freq: Every day | ORAL | 0 refills | Status: DC

## 2018-01-04 NOTE — Telephone Encounter (Signed)
CE is scheduled 01/15/18.

## 2018-01-07 ENCOUNTER — Other Ambulatory Visit: Payer: Self-pay | Admitting: Internal Medicine

## 2018-01-11 ENCOUNTER — Ambulatory Visit (HOSPITAL_COMMUNITY): Admission: RE | Admit: 2018-01-11 | Payer: 59 | Source: Ambulatory Visit

## 2018-01-15 ENCOUNTER — Encounter: Payer: Self-pay | Admitting: Obstetrics & Gynecology

## 2018-01-15 ENCOUNTER — Ambulatory Visit (INDEPENDENT_AMBULATORY_CARE_PROVIDER_SITE_OTHER): Payer: 59 | Admitting: Obstetrics & Gynecology

## 2018-01-15 ENCOUNTER — Other Ambulatory Visit: Payer: Self-pay | Admitting: Physician Assistant

## 2018-01-15 VITALS — BP 148/94 | Ht 60.5 in | Wt 197.0 lb

## 2018-01-15 DIAGNOSIS — N951 Menopausal and female climacteric states: Secondary | ICD-10-CM | POA: Diagnosis not present

## 2018-01-15 DIAGNOSIS — Z1231 Encounter for screening mammogram for malignant neoplasm of breast: Secondary | ICD-10-CM

## 2018-01-15 DIAGNOSIS — E6609 Other obesity due to excess calories: Secondary | ICD-10-CM | POA: Diagnosis not present

## 2018-01-15 DIAGNOSIS — Z01419 Encounter for gynecological examination (general) (routine) without abnormal findings: Secondary | ICD-10-CM

## 2018-01-15 DIAGNOSIS — M5441 Lumbago with sciatica, right side: Secondary | ICD-10-CM

## 2018-01-15 DIAGNOSIS — Z803 Family history of malignant neoplasm of breast: Secondary | ICD-10-CM | POA: Diagnosis not present

## 2018-01-15 DIAGNOSIS — Z6837 Body mass index (BMI) 37.0-37.9, adult: Secondary | ICD-10-CM

## 2018-01-15 NOTE — Progress Notes (Signed)
Samantha Greene [birth year] [id number]   History:    47 y.o. [id number] Married  RP:  Established patient presenting for annual gyn exam   HPI: On BCPs with severe hot flushes/night sweats.  Not always having a withdrawal bleeding on the pill.  No breakthrough bleeding.  No pelvic pain.  Vaginal dryness with IC.  Urine and bowel movements normal.  Breasts normal.  Mother with Breast Cancer.  No genetic screening done.  Body mass index 37.84.  Planning to increase fitness activities.  Health labs with family physician.    Past medical history,surgical history, family history and social history were all reviewed and documented in the EPIC chart.  Gynecologic History No LMP recorded. (Menstrual status: Oral contraceptives). Contraception: OCP (estrogen/progesterone) Last Pap: 10/2016. Results were: Negative/HPV HR neg Last mammogram: 08/2015. Results were: Benign.  [facility] Bone Density: Never Colonoscopy: Never  Obstetric History OB History  Gravida Para Term Preterm AB Living  1 0       0  SAB TAB Ectopic Multiple Live Births               # Outcome Date GA Lbr Len/2nd Weight Sex Delivery Anes PTL Lv  1 Gravida              ROS: A ROS was performed and pertinent positives and negatives are included in the history.  GENERAL: No fevers or chills. HEENT: No change in vision, no earache, sore throat or sinus congestion. NECK: No pain or stiffness. CARDIOVASCULAR: No chest pain or pressure. No palpitations. PULMONARY: No shortness of breath, cough or wheeze. GASTROINTESTINAL: No abdominal pain, nausea, vomiting or diarrhea, melena or bright red blood per rectum. GENITOURINARY: No urinary frequency, urgency, hesitancy or dysuria. MUSCULOSKELETAL: No joint or muscle pain, no back pain, no recent trauma. DERMATOLOGIC: No rash, no itching, no lesions. ENDOCRINE: No polyuria, polydipsia, no heat or cold intolerance. No recent change in weight. HEMATOLOGICAL: No  anemia or easy bruising or bleeding. NEUROLOGIC: No headache, seizures, numbness, tingling or weakness. PSYCHIATRIC: No depression, no loss of interest in normal activity or change in sleep pattern.     Exam:   BP (!) 148/94   Ht 5' 0.5" (1.537 m)   Wt 197 lb (89.4 kg)   BMI 37.84 kg/m   Body mass index is 37.84 kg/m.  General appearance : Well developed well nourished female. No acute distress HEENT: Eyes: no retinal hemorrhage or exudates,  Neck supple, trachea midline, no carotid bruits, no thyroidmegaly Lungs: Clear to auscultation, no rhonchi or wheezes, or rib retractions  Heart: Regular rate and rhythm, no murmurs or gallops Breast:Examined in sitting and supine position were symmetrical in appearance, no palpable masses or tenderness,  no skin retraction, no nipple inversion, no nipple discharge, no skin discoloration, no axillary or supraclavicular lymphadenopathy Abdomen: no palpable masses or tenderness, no rebound or guarding Extremities: no edema or skin discoloration or tenderness  Pelvic: Vulva: Normal             Vagina: No gross lesions or discharge  Cervix: No gross lesions or discharge.  Pap reflex done.  Uterus  AV, normal size, shape and consistency, non-tender and mobile  Adnexa  Without masses or tenderness  Anus: Normal   Assessment/Plan:  47 y.o. female for annual exam   1. Encounter for routine gynecological examination with Papanicolaou smear of cervix Normal gynecologic exam.  Pap reflex done.  Breast exam normal.  Schedule screening mammogram  now.  Health labs with family physician.  2. Menopausal syndrome Probably entering menopause with severe vasomotor symptoms and vaginal dryness and pain with intercourse.  Mother with history of breast cancer in her 850s, her twin sister also had a breast cancer.  Family history of ovarian cancer, colon cancer and prostate cancer.  Patient referred to genetic counselor for testing before deciding on hormone  replacement therapy.  Will do an FSH in a week after stopping the birth control pills.  If genetic testing is negative and decides to start on hormone replacement therapy, will start on the estradiol patch and the Mirena IUD for continued contraception. - FSH  3. Class 2 obesity due to excess calories without serious comorbidity with body mass index (BMI) of 37.0 to 37.9 in adult Recommend lower calorie/carb diet such as [employer] diet and increase physical activity with aerobic activities 5 times a week and weightlifting every 2 days.  4. Family history of breast cancer in mother Mother with breast cancer, as well as her twin sister.  Family history of ovarian cancer, colon cancer and prostate cancer.  Referred for Genetic counseling/testing.  Genia DelMarie-Lyne Sanaya Gwilliam MD, 3:08 PM 01/15/2018

## 2018-01-16 ENCOUNTER — Telehealth: Payer: Self-pay | Admitting: *Deleted

## 2018-01-16 ENCOUNTER — Encounter: Payer: Self-pay | Admitting: *Deleted

## 2018-01-16 ENCOUNTER — Encounter: Payer: Self-pay | Admitting: Obstetrics & Gynecology

## 2018-01-16 DIAGNOSIS — Z800 Family history of malignant neoplasm of digestive organs: Secondary | ICD-10-CM

## 2018-01-16 DIAGNOSIS — Z8042 Family history of malignant neoplasm of prostate: Secondary | ICD-10-CM

## 2018-01-16 DIAGNOSIS — Z8041 Family history of malignant neoplasm of ovary: Secondary | ICD-10-CM

## 2018-01-16 DIAGNOSIS — Z803 Family history of malignant neoplasm of breast: Secondary | ICD-10-CM

## 2018-01-16 LAB — PAP IG W/ RFLX HPV ASCU

## 2018-01-16 NOTE — Telephone Encounter (Signed)
Referral placed at Cancer center for genetics they will call to schedule. Sent my chart message informing patient of this as well.

## 2018-01-16 NOTE — Telephone Encounter (Signed)
-----   Message from Genia DelMarie-Lyne Lavoie, MD sent at 01/15/2018  3:25 PM EST ----- Regarding: Refer for Genetic counseling/testing Mother and twin sister of mother with Breast Ca in 1750's.  Fam h/o Ovarian, Prostate, Colon cancer.

## 2018-01-16 NOTE — Patient Instructions (Signed)
1. Encounter for routine gynecological examination with Papanicolaou smear of cervix Normal gynecologic exam.  Pap reflex done.  Breast exam normal.  Schedule screening mammogram now.  Health labs with family physician.  2. Menopausal syndrome Probably entering menopause with severe vasomotor symptoms and vaginal dryness and pain with intercourse.  Mother with history of breast cancer in her 5250s, her twin sister also had a breast cancer.  Family history of ovarian cancer, colon cancer and prostate cancer.  Patient referred to genetic counselor for testing before deciding on hormone replacement therapy.  Will do an FSH in a week after stopping the birth control pills.  If genetic testing is negative and decides to start on hormone replacement therapy, will start on the estradiol patch and the Mirena IUD for continued contraception. - FSH  3. Class 2 obesity due to excess calories without serious comorbidity with body mass index (BMI) of 37.0 to 37.9 in adult Recommend lower calorie/carb diet such as [employer] diet and increase physical activity with aerobic activities 5 times a week and weightlifting every 2 days.  4. Family history of breast cancer in mother Mother with breast cancer, as well as her twin sister.  Family history of ovarian cancer, colon cancer and prostate cancer.  Referred for Genetic counseling/testing.  Samantha Greene, it was a pleasure seeing you today!  I will inform you of your results as soon as they are available.

## 2018-01-22 ENCOUNTER — Other Ambulatory Visit: Payer: Self-pay | Admitting: Anesthesiology

## 2018-01-22 ENCOUNTER — Encounter: Payer: Self-pay | Admitting: Genetics

## 2018-01-22 ENCOUNTER — Other Ambulatory Visit: Payer: 59

## 2018-01-22 ENCOUNTER — Telehealth: Payer: Self-pay | Admitting: Genetics

## 2018-01-22 DIAGNOSIS — N951 Menopausal and female climacteric states: Secondary | ICD-10-CM

## 2018-01-22 NOTE — Telephone Encounter (Signed)
A genetic counseling appt has been scheduled for the pt to see Darral DashLindsay Smith on 1/21 at 1pm. Pt aware to arrive 15 minutes early. Letter mailed.

## 2018-01-23 LAB — FOLLICLE STIMULATING HORMONE: FSH: 27.1 m[IU]/mL

## 2018-01-25 ENCOUNTER — Encounter: Payer: Self-pay | Admitting: *Deleted

## 2018-01-25 ENCOUNTER — Encounter: Payer: Self-pay | Admitting: Internal Medicine

## 2018-01-25 NOTE — Telephone Encounter (Signed)
Patient scheduled on 02/20/18

## 2018-02-05 ENCOUNTER — Other Ambulatory Visit: Payer: Self-pay | Admitting: Internal Medicine

## 2018-02-07 ENCOUNTER — Other Ambulatory Visit: Payer: Self-pay | Admitting: Adult Health

## 2018-02-07 DIAGNOSIS — M5441 Lumbago with sciatica, right side: Secondary | ICD-10-CM

## 2018-02-16 ENCOUNTER — Ambulatory Visit (INDEPENDENT_AMBULATORY_CARE_PROVIDER_SITE_OTHER): Payer: BLUE CROSS/BLUE SHIELD | Admitting: Pulmonary Disease

## 2018-02-16 ENCOUNTER — Encounter: Payer: Self-pay | Admitting: Pulmonary Disease

## 2018-02-16 VITALS — BP 160/90 | HR 77 | Ht 60.5 in | Wt 196.0 lb

## 2018-02-16 DIAGNOSIS — Q791 Other congenital malformations of diaphragm: Secondary | ICD-10-CM

## 2018-02-16 DIAGNOSIS — R0609 Other forms of dyspnea: Secondary | ICD-10-CM | POA: Diagnosis not present
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
## 2018-02-16 NOTE — Patient Instructions (Signed)
Shortness of breath in the setting of right sided eventration of the diaphragm: As stated today in clinic I think this worsened with weight gain over the last 13 years I think the best approach moving forward is to try to exercise as much as possible and lose weight I would avoid exercises which increase the abdominal pressure such as crunches, squats. We will check a lung function test to see which her lung capacity is We will check a test called a diaphragm sniff test to see how well your right-sided diaphragm works We will refer you to Dr. Dorris Fetch for his opinion on this condition.  We will see you back in 2 to 3 months or sooner if need

## 2018-02-20 ENCOUNTER — Encounter: Payer: Self-pay | Admitting: Genetics

## 2018-02-20 ENCOUNTER — Inpatient Hospital Stay: Payer: BLUE CROSS/BLUE SHIELD

## 2018-02-20 ENCOUNTER — Inpatient Hospital Stay: Payer: BLUE CROSS/BLUE SHIELD | Attending: Internal Medicine | Admitting: Genetics

## 2018-02-20 DIAGNOSIS — Z800 Family history of malignant neoplasm of digestive organs: Secondary | ICD-10-CM

## 2018-02-20 DIAGNOSIS — Z803 Family history of malignant neoplasm of breast: Secondary | ICD-10-CM | POA: Diagnosis not present

## 2018-02-20 DIAGNOSIS — Z808 Family history of malignant neoplasm of other organs or systems: Secondary | ICD-10-CM | POA: Diagnosis not present

## 2018-02-20 DIAGNOSIS — Z8042 Family history of malignant neoplasm of prostate: Secondary | ICD-10-CM

## 2018-02-20 DIAGNOSIS — Z801 Family history of malignant neoplasm of trachea, bronchus and lung: Secondary | ICD-10-CM | POA: Diagnosis not present
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
## 2018-02-21 ENCOUNTER — Other Ambulatory Visit: Payer: Self-pay | Admitting: Adult Health

## 2018-02-21 ENCOUNTER — Other Ambulatory Visit: Payer: Self-pay | Admitting: Internal Medicine

## 2018-02-21 DIAGNOSIS — M5441 Lumbago with sciatica, right side: Secondary | ICD-10-CM

## 2018-02-21 MED ORDER — BACLOFEN 10 MG PO TABS: ORAL_TABLET | ORAL | 1 refills | Status: DC

## 2018-02-22 ENCOUNTER — Ambulatory Visit: Payer: 59

## 2018-02-25 ENCOUNTER — Other Ambulatory Visit: Payer: Self-pay | Admitting: Physician Assistant

## 2018-02-25 DIAGNOSIS — G4700 Insomnia, unspecified: Secondary | ICD-10-CM

## 2018-02-27 ENCOUNTER — Other Ambulatory Visit: Payer: Self-pay | Admitting: Physician Assistant

## 2018-02-27 DIAGNOSIS — G4700 Insomnia, unspecified: Secondary | ICD-10-CM

## 2018-02-27 DIAGNOSIS — F330 Major depressive disorder, recurrent, mild: Secondary | ICD-10-CM

## 2018-02-28 ENCOUNTER — Other Ambulatory Visit: Payer: Self-pay | Admitting: Physician Assistant

## 2018-02-28 DIAGNOSIS — G4700 Insomnia, unspecified: Secondary | ICD-10-CM

## 2018-03-01 ENCOUNTER — Other Ambulatory Visit: Payer: Self-pay | Admitting: Physician Assistant

## 2018-03-01 DIAGNOSIS — G4700 Insomnia, unspecified: Secondary | ICD-10-CM

## 2018-03-01 MED ORDER — ESZOPICLONE 3 MG PO TABS: 3.0000 mg | ORAL_TABLET | Freq: Every evening | ORAL | 3 refills | Status: DC | PRN

## 2018-03-02 ENCOUNTER — Ambulatory Visit (INDEPENDENT_AMBULATORY_CARE_PROVIDER_SITE_OTHER): Payer: BLUE CROSS/BLUE SHIELD | Admitting: Pulmonary Disease

## 2018-03-02 DIAGNOSIS — Q791 Other congenital malformations of diaphragm: Secondary | ICD-10-CM

## 2018-03-02 LAB — PULMONARY FUNCTION TEST
DL/VA % pred: 166 %
DL/VA: 7.17 ml/min/mmHg/L
DLCO UNC % PRED: 110 %
DLCO UNC: 21.54 ml/min/mmHg
RV % pred: 82 %
RV: 1.3 L
TLC % PRED: 73 %
TLC: 3.31 L

## 2018-03-02 NOTE — Progress Notes (Signed)
PFT completed today.  

## 2018-03-04 NOTE — Progress Notes (Deleted)
Complete Physical  Assessment and Plan: Essential hypertension - continue medications, DASH diet, exercise and monitor at home. Call if greater than 130/80.  -     CBC with Differential/Platelet -     BASIC METABOLIC PANEL WITH GFR -     Hepatic function panel -     TSH -     Urinalysis, Routine w reflex microscopic -     Microalbumin / creatinine urine ratio -     EKG 12-Lead  Mild episode of recurrent major depressive disorder (HCC) - will increase to wellbutrin, stress management techniques discussed, increase water, good sleep hygiene discussed, increase exercise, and increase veggies.  -     Ambulatory referral to Psychiatry -     buPROPion (WELLBUTRIN XL) 300 MG 24 hr tablet; Take 1 tablet (300 mg total) by mouth every morning.  Mixed hyperlipidemia -continue medications, check lipids, decrease fatty foods, increase activity.  -     Lipid panel  Anxiety Continue medications  Insomnia, unspecified type Increase trazodone to 1.5, may need to add on med for RLS  Vitamin D deficiency -     VITAMIN D 25 Hydroxy (Vit-D Deficiency, Fractures)  Class 1 obesity due to excess calories with serious comorbidity in adult, unspecified BMI - follow up 3 months for progress monitoring - increase veggies, decrease carbs - long discussion about weight loss, diet, and exercise  Medication management -     Magnesium  Encounter for general adult medical examination with abnormal findings 1 year, get MGM  BMI 39.0-39.9,adult - follow up 3 months for progress monitoring - increase veggies, decrease carbs - long discussion about weight loss, diet, and exercise  Psoriasis, guttate -     triamcinolone ointment (KENALOG) 0.1 %; Apply 1 application topically 2 (two) times daily.  Screening, anemia, deficiency, iron -     Iron,Total/Total Iron Binding Cap  Hypertrophy of breast Will send to PT for back pain, treat yeast, work on weight loss May benefit from breast reduction in the  future.   Chronic midline thoracic back pain Lower back pain - negative straight leg NSAIDs, RICE, and exercise given If not better follow up in office or will refer to PT/orthopedics. Natural history and expected course discussed. Questions answered. Stretching exercises discussed. Heat to affected area as needed for local pain relief. -     Ambulatory referral to Physical Therapy  Yeast dermatitis -     triamcinolone ointment (KENALOG) 0.1 %; Apply 1 application topically 2 (two) times daily. -     nystatin cream (MYCOSTATIN); Apply 1 application topically 2 (two) times daily.   Discussed med's effects and SE's. Screening labs and tests as requested with regular follow-up as recommended. Future Appointments  Date Time Provider Department Center  03/06/2018 10:00 AM Quentin Mulling, PA-C [facility] None  03/08/2018  9:30 AM [facility] R/F 1 [facility]   03/13/2018 11:30 AM Loreli Slot, MD [facility]  03/28/2018  1:30 PM [facility] MM 2 [facility]  05/16/2018  1:45 PM Lupita Leash, MD [facility] None  01/17/2019  2:00 PM Genia Del, MD [facility]  03/18/2019 10:00 AM Quentin Mulling, PA-C [facility] None    HPI  48 y.o. female  presents for a complete physical.  She is on zoloft 100 and wellbutrin 150 XL, she states her depression has been worse last several months x Nov. She states she visits her husbands mom, and other than that does not work have any anything to do and feels  isolated and bored. She is on trazodone and zyrtec for sleep study states she is still having trouble sleeping. She does feel that she has restless legs.   She has thoracic back pain and large breast, she also has indentations from her bra and yeast under her breast. Baclofen is not helping.    Her blood pressure has been controlled at home, today their BP is  .  She does workout. She denies chest pain, shortness of breath, dizziness.   She is on cholesterol medication  and denies myalgias. Her cholesterol is at goal. The cholesterol last visit was:  Lab Results  Component Value Date   CHOL 241 (H) 10/12/2017   HDL 89 10/12/2017   LDLCALC 118 (H) 10/12/2017   TRIG 216 (H) 10/12/2017   CHOLHDL 2.7 10/12/2017  . She has been working on diet and exercise for prediabetes, she is on bASA, she is on ACE/ARB and denies foot ulcerations, hyperglycemia, hypoglycemia , increased appetite, nausea, paresthesia of the feet, polydipsia, polyuria, visual disturbances, vomiting and weight loss. Last A1C in the office was:  Lab Results  Component Value Date   HGBA1C 5.3 10/12/2017   Patient is on Vitamin D supplement.   Lab Results  Component Value Date   VD25OH 27 (L) 02/22/2017     BMI is There is no height or weight on file to calculate BMI., she is working on diet and exercise. Wt Readings from Last 3 Encounters:  02/16/18 196 lb (88.9 kg)  01/15/18 197 lb (89.4 kg)  12/13/17 197 lb 9.6 oz (89.6 kg)   She has recurrent sinus injections, going to call Dr. Ezzard StandingNewman to try get nasal surgery.   Current Medications:  Current Outpatient Medications on File Prior to Visit  Medication Sig Dispense Refill  . albuterol (VENTOLIN HFA) 108 (90 Base) MCG/ACT inhaler Inhale 2 puffs into the lungs every 4 (four) hours as needed for wheezing or shortness of breath. 1 Inhaler 0  . aspirin 81 MG tablet Take 81 mg by mouth daily.    . baclofen (LIORESAL) 10 MG tablet TAKE 1/2 TO 1 TABLET BY MOUTH 2X DAY IF NEEDED FOR MUSCLE SPASM 180 tablet 1  . BIOTIN PO Take by mouth.    Marland Kitchen. buPROPion (WELLBUTRIN XL) 300 MG 24 hr tablet TAKE 1 TABLET BY MOUTH EVERY DAY IN THE MORNING 90 tablet 1  . Cholecalciferol (VITAMIN D) 2000 UNITS tablet Take 2,000 Units by mouth daily. Pt takes Finest Nutrition Bone Health    . escitalopram (LEXAPRO) 20 MG tablet TAKE 1 TABLET BY MOUTH EVERY DAY 90 tablet 1  . Eszopiclone 3 MG TABS Take 1 tablet (3 mg total) by mouth at bedtime as needed. Take immediately  before bedtime 30 tablet 3  . fluticasone (FLONASE) 50 MCG/ACT nasal spray Place 2 sprays into both nostrils daily. 16 g 2  . meloxicam (MOBIC) 15 MG tablet TAKE 1 TABLET BY MOUTH EVERY DAY 30 tablet 2  . montelukast (SINGULAIR) 10 MG tablet Take 1 tablet daily for Allergies 90 tablet 3  . nystatin cream (MYCOSTATIN) Apply 1 application topically 2 (two) times daily. (Patient taking differently: Apply 1 application topically 2 (two) times daily as needed. ) 30 g 1  . omeprazole (PRILOSEC) 40 MG capsule TAKE 1 CAPSULE BY MOUTH DAILY 90 capsule 0  . OVER THE COUNTER MEDICATION Patient takes 1 gummy multivitamin daily.    Marland Kitchen. OVER THE COUNTER MEDICATION Takes OTC Zyrtec 10 mg PRN.    . pravastatin (PRAVACHOL)  40 MG tablet TAKE 1 TABLET BY MOUTH EVERY DAY 90 tablet 1  . telmisartan (MICARDIS) 40 MG tablet TAKE 1 TABLET BY MOUTH  DAILY 90 tablet 1  . triamcinolone ointment (KENALOG) 0.1 % Apply 1 application topically 2 (two) times daily. (Patient taking differently: Apply 1 application topically daily as needed. ) 80 g 1   No current facility-administered medications on file prior to visit.     Health Maintenance:   Immunization History  Administered Date(s) Administered  . Influenza Inj Mdck Quad With Preservative 10/25/2016  . Influenza, Seasonal, Injecte, Preservative Fre 01/01/2015  . Influenza,inj,Quad PF,6+ Mos 10/25/2012  . PPD Test 04/11/2013  . Pneumococcal Polysaccharide-23 03/28/2012  . Tdap 12/12/2012   Tetanus: 2014 Pneumovax: 2014 Flu vaccine: 2018  Pap: Due with Levar July MGM: Dr. Carman ChingLevar 08/2015  Last Dental Exam: Yearly visit  Patient Care Team: Lucky CowboyMcKeown, William, MD as PCP - General (Internal Medicine) Antionette CharJackson-Moore, Lisa, MD as Consulting Physician (Obstetrics and Gynecology) Cindee SaltKuzma, Gary, MD as Consulting Physician (Orthopedic Surgery)  Medical History:  Past Medical History:  Diagnosis Date  . Anxiety   . Depression    no meds  . Family history of breast cancer    . Family history of colon cancer   . Family history of lung cancer   . Family history of pancreatic cancer   . Family history of prostate cancer   . Hyperlipidemia   . Hypertension    no meds   Allergies Allergies  Allergen Reactions  . Erythromycin Nausea Only and Other (See Comments)    Stomach cramping (can take Z Pak)  . Flexeril [Cyclobenzaprine Hcl] Other (See Comments)    "don't like the way I feel"  . Metformin And Related     GI Upset    SURGICAL HISTORY She  has a past surgical history that includes Dilation and evacuation and Dilation and curettage of uterus.   FAMILY HISTORY Her family history includes Breast cancer in her cousin and maternal aunt; Breast cancer (age of onset: 2355) in her mother; Cervical cancer in her paternal aunt; Colon cancer in her maternal aunt, maternal aunt, maternal uncle, and maternal uncle; Colon polyps in her maternal aunt and mother; Congestive Heart Failure in her mother; Diabetes in her brother, maternal aunt, maternal uncle, and mother; Heart disease in her maternal aunt, maternal grandmother, and maternal uncle; Hemochromatosis in her paternal uncle; Hyperlipidemia in her brother, maternal aunt, maternal uncle, and mother; Hypertension in her brother, maternal aunt, maternal uncle, and mother; Kidney disease in her mother; Lung cancer in her mother; Ovarian cancer in her paternal aunt; Pancreatic cancer in her maternal aunt; Polycystic ovary syndrome in her paternal aunt; Prostate cancer in her cousin, cousin, and maternal uncle; Stroke in her maternal aunt and maternal uncle; Throat cancer in her maternal aunt.   SOCIAL HISTORY She  reports that she has never smoked. She has never used smokeless tobacco. She reports that she does not drink alcohol or use drugs.  Review of Systems: Review of Systems  Constitutional: Negative for chills, fever and malaise/fatigue.  HENT: Negative for congestion, ear pain and sore throat.   Eyes: Negative.    Respiratory: Negative for cough, shortness of breath and wheezing.   Cardiovascular: Negative for chest pain, palpitations and leg swelling.  Gastrointestinal: Negative for abdominal pain, blood in stool, constipation, diarrhea, heartburn and melena.  Genitourinary: Negative for dysuria, frequency, hematuria and urgency.  Neurological: Negative for dizziness, loss of consciousness and headaches.  Psychiatric/Behavioral: Negative  for depression. The patient is not nervous/anxious and does not have insomnia.     Physical Exam: Estimated body mass index is 37.65 kg/m as calculated from the following:   Height as of 02/16/18: 5' 0.5" (1.537 m).   Weight as of 02/16/18: 196 lb (88.9 kg). There were no vitals taken for this visit.  General Appearance: Well nourished well developed, in no apparent distress.  Eyes: PERRLA, EOMs, conjunctiva no swelling or erythema ENT/Mouth: Ear canals normal without obstruction, swelling, erythema, or discharge.  TMs normal bilaterally with no erythema, bulging, retraction, or loss of landmark.  Oropharynx moist and clear with no exudate, erythema, or swelling.  Neck: Supple, thyroid normal. No bruits.  No cervical adenopathy Respiratory: Respiratory effort normal, Breath sounds clear A&P without wheeze, rhonchi, rales.   Cardio: RRR without murmurs, rubs or gallops. Brisk peripheral pulses without edema.  Chest: symmetric, with normal excursions Breasts: Symmetric, without lumps, nipple discharge, retractions.  Abdomen: Soft, nontender, no guarding, rebound, hernias, masses, or organomegaly.  Lymphatics: Non tender without lymphadenopathy.  Musculoskeletal: Full ROM all peripheral extremities,5/5 strength, and normal gait.  Skin: Warm, dry without rashes, lesions, ecchymosis. Neuro: Awake and oriented X 3, Cranial nerves intact, reflexes equal bilaterally. Normal muscle tone, no cerebellar symptoms. Sensation intact.  Psych:  normal affect, Insight and  Judgment appropriate.   EKG: WNL no changes.  Over 40 minutes of exam, counseling, chart review and critical decision making was performed  Quentin Mulling 1:40 PM [facility]

## 2018-03-06 ENCOUNTER — Encounter: Payer: Self-pay | Admitting: Physician Assistant

## 2018-03-07 NOTE — Progress Notes (Signed)
Complete Physical  Assessment and Plan: Essential hypertension - continue medications, DASH diet, exercise and monitor at home. Call if greater than 130/80.  -     CBC with Differential/Platelet -     BASIC METABOLIC PANEL WITH GFR -     Hepatic function panel -     TSH -     Urinalysis, Routine w reflex microscopic -     Microalbumin / creatinine urine ratio -     EKG 12-Lead  Mild episode of recurrent major depressive disorder (HCC) - continue medications stress management techniques discussed, increase water, good sleep hygiene discussed, increase exercise, and increase veggies.   Mixed hyperlipidemia -continue medications, check lipids, decrease fatty foods, increase activity.  -     Lipid panel  Anxiety Continue medications  Insomnia, unspecified type Increase trazodone to 1.5, may need to add on med for RLS  Vitamin D deficiency -     VITAMIN D 25 Hydroxy (Vit-D Deficiency, Fractures)  Class 1 obesity due to excess calories with serious comorbidity in adult, unspecified BMI - follow up 3 months for progress monitoring - increase veggies, decrease carbs - long discussion about weight loss, diet, and exercise  Medication management -     Magnesium  Encounter for general adult medical examination with abnormal findings 1 year, get MGM  BMI 39.0-39.9,adult - follow up 3 months for progress monitoring - increase veggies, decrease carbs - long discussion about weight loss, diet, and exercise  Psoriasis, guttate -     triamcinolone ointment (KENALOG) 0.1 %; Apply 1 application topically 2 (two) times daily.  Hypertrophy of breast May benefit from breast reduction in the future.   Dyspnea on exertion Follow up with cardiothoracic surgery and pulmonary May proceed with HIDA scan, has RUQ pain, some of the dyspnea/pain may be GI related, continue prilosec, get HIDA.   Acute midline low back pain with right-sided sciatica -     meloxicam (MOBIC) 15 MG tablet; Take 1  tablet (15 mg total) by mouth daily. May benefit from breast reduction in the future.   Discussed med's effects and SE's. Screening labs and tests as requested with regular follow-up as recommended. Future Appointments  Date Time Provider Department Center  03/13/2018 11:30 AM Loreli Slot, MD [facility]  03/28/2018  1:30 PM [facility] MM 2 [facility] CE  05/16/2018  1:45 PM Lupita Leash, MD [facility] None  01/17/2019  2:00 PM Genia Del, MD [facility]  03/18/2019 10:00 AM Quentin Mulling, PA-C [facility] None    HPI  48 y.o. female  presents for a complete physical.  She has added on fiber, feels that she is having more stool/not evacuating completely. No blood in stool, no black stool, no cramping.  She has RUQ pain occ, had AB [place] with gallstones, suppose to get HIDA but has not had.   She has had SOB, has been following with Dr. Kendrick Fries, had PFTs, sniff test and is being referred to cardiothoracic surgery for evaluation.    She has thoracic back pain and large breast, she also has indentations from her bra and yeast under her breast. Baclofen is not helping.  She had genetic testing recently due to family history of CA. GYN wants to put her on estrogen patch but will get testing first.   Her blood pressure has been controlled at home, today their BP is BP: 134/86.  She does workout. She denies chest pain, shortness of breath, dizziness.   She is on cholesterol  medication and denies myalgias. Her cholesterol is at goal. The cholesterol last visit was:  Lab Results  Component Value Date   CHOL 241 (H) 10/12/2017   HDL 89 10/12/2017   LDLCALC 118 (H) 10/12/2017   TRIG 216 (H) 10/12/2017   CHOLHDL 2.7 10/12/2017  . She has been working on diet and exercise for prediabetes, she is on bASA, she is on ACE/ARB and denies foot ulcerations, hyperglycemia, hypoglycemia , increased appetite, nausea, paresthesia of the feet, polydipsia, polyuria, visual  disturbances, vomiting and weight loss. Last A1C in the office was:  Lab Results  Component Value Date   HGBA1C 5.3 10/12/2017   Patient is on Vitamin D supplement, she is on 5000 daily.   Lab Results  Component Value Date   VD25OH 27 (L) 02/22/2017     BMI is Body mass index is 38.19 kg/m., she is working on diet and exercise. Wt Readings from Last 3 Encounters:  03/08/18 198 lb 12.8 oz (90.2 kg)  02/16/18 196 lb (88.9 kg)  01/15/18 197 lb (89.4 kg)   She has recurrent sinus injections, going to call Dr. Ezzard Standing to try get nasal surgery- has been 3 years.   Current Medications:  Current Outpatient Medications on File Prior to Visit  Medication Sig Dispense Refill  . albuterol (VENTOLIN HFA) 108 (90 Base) MCG/ACT inhaler Inhale 2 puffs into the lungs every 4 (four) hours as needed for wheezing or shortness of breath. 1 Inhaler 0  . aspirin 81 MG tablet Take 81 mg by mouth daily.    . baclofen (LIORESAL) 10 MG tablet TAKE 1/2 TO 1 TABLET BY MOUTH 2X DAY IF NEEDED FOR MUSCLE SPASM 180 tablet 1  . BIOTIN PO Take by mouth.    Marland Kitchen buPROPion (WELLBUTRIN XL) 300 MG 24 hr tablet TAKE 1 TABLET BY MOUTH EVERY DAY IN THE MORNING 90 tablet 1  . Cholecalciferol (VITAMIN D) 2000 UNITS tablet Take 2,000 Units by mouth daily. Pt takes Finest Nutrition Bone Health    . escitalopram (LEXAPRO) 20 MG tablet TAKE 1 TABLET BY MOUTH EVERY DAY 90 tablet 1  . Eszopiclone 3 MG TABS Take 1 tablet (3 mg total) by mouth at bedtime as needed. Take immediately before bedtime 30 tablet 3  . meloxicam (MOBIC) 15 MG tablet TAKE 1 TABLET BY MOUTH EVERY DAY 30 tablet 2  . montelukast (SINGULAIR) 10 MG tablet Take 1 tablet daily for Allergies 90 tablet 3  . nystatin cream (MYCOSTATIN) Apply 1 application topically 2 (two) times daily. (Patient taking differently: Apply 1 application topically 2 (two) times daily as needed. ) 30 g 1  . omeprazole (PRILOSEC) 40 MG capsule TAKE 1 CAPSULE BY MOUTH DAILY 90 capsule 0  . OVER  THE COUNTER MEDICATION Patient takes 1 gummy multivitamin daily.    Marland Kitchen OVER THE COUNTER MEDICATION Takes OTC Zyrtec 10 mg PRN.    . pravastatin (PRAVACHOL) 40 MG tablet TAKE 1 TABLET BY MOUTH EVERY DAY 90 tablet 1  . telmisartan (MICARDIS) 40 MG tablet TAKE 1 TABLET BY MOUTH  DAILY 90 tablet 1  . triamcinolone ointment (KENALOG) 0.1 % Apply 1 application topically 2 (two) times daily. (Patient taking differently: Apply 1 application topically daily as needed. ) 80 g 1  . fluticasone (FLONASE) 50 MCG/ACT nasal spray Place 2 sprays into both nostrils daily. 16 g 2   No current facility-administered medications on file prior to visit.     Health Maintenance:   Immunization History  Administered Date(s) Administered  .  Influenza Inj Mdck Quad With Preservative 10/25/2016  . Influenza, Seasonal, Injecte, Preservative Fre 01/01/2015  . Influenza,inj,Quad PF,6+ Mos 10/25/2012  . PPD Test 04/11/2013  . Pneumococcal Polysaccharide-23 03/28/2012  . Tdap 12/12/2012   Tetanus: 2014 Pneumovax: 2014 Flu vaccine: DUE going to get at [facility], out in the office  Pap: Levar Dec 2019 MGM: getting this month  Last Dental Exam: Yearly visit PFTs unable to complete  Patient Care Team: Lucky CowboyMcKeown, William, MD as PCP - General (Internal Medicine) Antionette CharJackson-Moore, Lisa, MD as Consulting Physician (Obstetrics and Gynecology) Cindee SaltKuzma, Gary, MD as Consulting Physician (Orthopedic Surgery)  Medical History:  Past Medical History:  Diagnosis Date  . Anxiety   . Depression    no meds  . Family history of breast cancer   . Family history of colon cancer   . Family history of lung cancer   . Family history of pancreatic cancer   . Family history of prostate cancer   . Hyperlipidemia   . Hypertension    no meds   Allergies Allergies  Allergen Reactions  . Erythromycin Nausea Only and Other (See Comments)    Stomach cramping (can take Z Pak)  . Flexeril [Cyclobenzaprine Hcl] Other (See Comments)    "don't  like the way I feel"  . Metformin And Related     GI Upset    SURGICAL HISTORY She  has a past surgical history that includes Dilation and evacuation and Dilation and curettage of uterus.   FAMILY HISTORY Her family history includes Breast cancer in her cousin and maternal aunt; Breast cancer (age of onset: 7055) in her mother; Cervical cancer in her paternal aunt; Colon cancer in her maternal aunt, maternal aunt, maternal uncle, and maternal uncle; Colon polyps in her maternal aunt and mother; Congestive Heart Failure in her mother; Diabetes in her brother, maternal aunt, maternal uncle, and mother; Heart disease in her maternal aunt, maternal grandmother, and maternal uncle; Hemochromatosis in her paternal uncle; Hyperlipidemia in her brother, maternal aunt, maternal uncle, and mother; Hypertension in her brother, maternal aunt, maternal uncle, and mother; Kidney disease in her mother; Lung cancer in her mother; Ovarian cancer in her paternal aunt; Pancreatic cancer in her maternal aunt; Polycystic ovary syndrome in her paternal aunt; Prostate cancer in her cousin, cousin, and maternal uncle; Stroke in her maternal aunt and maternal uncle; Throat cancer in her maternal aunt.   SOCIAL HISTORY She  reports that she has never smoked. She has never used smokeless tobacco. She reports that she does not drink alcohol or use drugs.  Review of Systems: Review of Systems  Constitutional: Negative for chills, fever and malaise/fatigue.  HENT: Negative for congestion, ear pain and sore throat.   Eyes: Negative.   Respiratory: Negative for cough, shortness of breath and wheezing.   Cardiovascular: Negative for chest pain, palpitations and leg swelling.  Gastrointestinal: Negative for abdominal pain, blood in stool, constipation, diarrhea, heartburn and melena.  Genitourinary: Negative for dysuria, frequency, hematuria and urgency.  Neurological: Negative for dizziness, loss of consciousness and headaches.   Psychiatric/Behavioral: Negative for depression. The patient is not nervous/anxious and does not have insomnia.     Physical Exam: Estimated body mass index is 38.19 kg/m as calculated from the following:   Height as of this encounter: 5' 0.5" (1.537 m).   Weight as of this encounter: 198 lb 12.8 oz (90.2 kg). BP 134/86   Pulse 76   Temp (!) 97.5 F (36.4 C)   Ht 5' 0.5" (1.537  m)   Wt 198 lb 12.8 oz (90.2 kg)   SpO2 95%   BMI 38.19 kg/m   General Appearance: Well nourished well developed, in no apparent distress.  Eyes: PERRLA, EOMs, conjunctiva no swelling or erythema ENT/Mouth: Ear canals normal without obstruction, swelling, erythema, or discharge.  TMs normal bilaterally with no erythema, bulging, retraction, or loss of landmark.  Oropharynx moist and clear with no exudate, erythema, or swelling.  Neck: Supple, thyroid normal. No bruits.  No cervical adenopathy Respiratory: Respiratory effort normal, Breath sounds clear A&P without wheeze, rhonchi, rales.   Cardio: RRR without murmurs, rubs or gallops. Brisk peripheral pulses without edema.  Chest: symmetric, with normal excursions Breasts: Symmetric, without lumps, nipple discharge, retractions.  Abdomen: Soft, nontender, no guarding, rebound, hernias, masses, or organomegaly.  Lymphatics: Non tender without lymphadenopathy.  Musculoskeletal: Full ROM all peripheral extremities,5/5 strength, and normal gait.  Skin: Warm, dry without rashes, lesions, ecchymosis. Neuro: Awake and oriented X 3, Cranial nerves intact, reflexes equal bilaterally. Normal muscle tone, no cerebellar symptoms. Sensation intact.  Psych:  normal affect, Insight and Judgment appropriate.   EKG: WNL no changes.  Over 40 minutes of exam, counseling, chart review and critical decision making was performed  Quentin Mulling 2:12 PM [facility]

## 2018-03-08 ENCOUNTER — Encounter: Payer: Self-pay | Admitting: Physician Assistant

## 2018-03-08 ENCOUNTER — Telehealth: Payer: Self-pay | Admitting: Physician Assistant

## 2018-03-08 ENCOUNTER — Ambulatory Visit (HOSPITAL_COMMUNITY)
Admission: RE | Admit: 2018-03-08 | Discharge: 2018-03-08 | Disposition: A | Discharge status: Home / Self Care | Payer: BLUE CROSS/BLUE SHIELD | Source: Ambulatory Visit | Attending: Pulmonary Disease | Admitting: Pulmonary Disease

## 2018-03-08 ENCOUNTER — Ambulatory Visit (INDEPENDENT_AMBULATORY_CARE_PROVIDER_SITE_OTHER): Payer: BLUE CROSS/BLUE SHIELD | Admitting: Physician Assistant

## 2018-03-08 VITALS — BP 134/86 | HR 76 | Temp 97.5°F | Ht 60.5 in | Wt 198.8 lb

## 2018-03-08 DIAGNOSIS — F330 Major depressive disorder, recurrent, mild: Secondary | ICD-10-CM | POA: Diagnosis not present

## 2018-03-08 DIAGNOSIS — E559 Vitamin D deficiency, unspecified: Secondary | ICD-10-CM | POA: Diagnosis not present

## 2018-03-08 DIAGNOSIS — J986 Disorders of diaphragm: Secondary | ICD-10-CM | POA: Diagnosis not present

## 2018-03-08 DIAGNOSIS — L404 Guttate psoriasis: Secondary | ICD-10-CM

## 2018-03-08 DIAGNOSIS — Z79899 Other long term (current) drug therapy: Secondary | ICD-10-CM | POA: Diagnosis not present

## 2018-03-08 DIAGNOSIS — Q791 Other congenital malformations of diaphragm: Secondary | ICD-10-CM | POA: Insufficient documentation

## 2018-03-08 DIAGNOSIS — E782 Mixed hyperlipidemia: Secondary | ICD-10-CM | POA: Diagnosis not present

## 2018-03-08 DIAGNOSIS — I10 Essential (primary) hypertension: Secondary | ICD-10-CM | POA: Diagnosis not present

## 2018-03-08 DIAGNOSIS — M5441 Lumbago with sciatica, right side: Secondary | ICD-10-CM

## 2018-03-08 DIAGNOSIS — F419 Anxiety disorder, unspecified: Secondary | ICD-10-CM

## 2018-03-08 DIAGNOSIS — R0609 Other forms of dyspnea: Secondary | ICD-10-CM | POA: Diagnosis not present

## 2018-03-08 DIAGNOSIS — G4700 Insomnia, unspecified: Secondary | ICD-10-CM

## 2018-03-08 MED ORDER — TELMISARTAN 40 MG PO TABS: 40.0000 mg | ORAL_TABLET | Freq: Every day | ORAL | 1 refills | Status: DC

## 2018-03-08 MED ORDER — MELOXICAM 15 MG PO TABS: 15.0000 mg | ORAL_TABLET | Freq: Every day | ORAL | 2 refills | Status: DC

## 2018-03-08 NOTE — Telephone Encounter (Signed)
Error

## 2018-03-08 NOTE — Patient Instructions (Addendum)
Your LDL could improve, ideally we want it under a 100.  Your LDL is the bad cholesterol that can lead to heart attack and stroke. To lower your number you can decrease your fatty foods, red meat, cheese, milk and increase fiber like whole grains and veggies. You can also add a fiber supplement like Citracel or Benefiber, these do not cause gas and bloating and are safe to use. Especially if you have a strong family history of heart disease or stroke or you have evidence of plaque on any imaging like a chest xray, we may discuss at your next office visit putting you on a medication to get your number below 100.     Google mindful eating and here are some tips and tricks below.   Rate your hunger before you eat on a scale of 1-10, try to eat closer to a 6 or higher. And if you are at below that, why are you eating? Slow down and listen to your body.

## 2018-03-09 LAB — CBC WITH DIFFERENTIAL/PLATELET
ABSOLUTE MONOCYTES: 659 {cells}/uL (ref 200–950)
BASOS PCT: 0.5 %
Basophils Absolute: 37 cells/uL (ref 0–200)
EOS ABS: 96 {cells}/uL (ref 15–500)
Eosinophils Relative: 1.3 %
HEMATOCRIT: 40.3 % (ref 35.0–45.0)
Hemoglobin: 14.1 g/dL (ref 11.7–15.5)
Lymphs Abs: 2390 cells/uL (ref 850–3900)
MCH: 32.6 pg (ref 27.0–33.0)
MCHC: 35 g/dL (ref 32.0–36.0)
MCV: 93.3 fL (ref 80.0–100.0)
MPV: 9.5 fL (ref 7.5–12.5)
Monocytes Relative: 8.9 %
NEUTROS ABS: 4218 {cells}/uL (ref 1500–7800)
Neutrophils Relative %: 57 %
PLATELETS: 320 10*3/uL (ref 140–400)
RBC: 4.32 10*6/uL (ref 3.80–5.10)
RDW: 12.1 % (ref 11.0–15.0)
Total Lymphocyte: 32.3 %
WBC: 7.4 10*3/uL (ref 3.8–10.8)

## 2018-03-09 LAB — URINALYSIS, ROUTINE W REFLEX MICROSCOPIC
BACTERIA UA: NONE SEEN /HPF
Bilirubin Urine: NEGATIVE
Glucose, UA: NEGATIVE
Hgb urine dipstick: NEGATIVE
Hyaline Cast: NONE SEEN /LPF
KETONES UR: NEGATIVE
Nitrite: NEGATIVE
PH: 6.5 (ref 5.0–8.0)
Protein, ur: NEGATIVE
RBC / HPF: NONE SEEN /HPF (ref 0–2)
SPECIFIC GRAVITY, URINE: 1.011 (ref 1.001–1.03)

## 2018-03-09 LAB — COMPLETE METABOLIC PANEL WITH GFR
AG RATIO: 1.8 (calc) (ref 1.0–2.5)
ALKALINE PHOSPHATASE (APISO): 73 U/L (ref 31–125)
ALT: 22 U/L (ref 6–29)
AST: 21 U/L (ref 10–35)
Albumin: 4.6 g/dL (ref 3.6–5.1)
BILIRUBIN TOTAL: 0.4 mg/dL (ref 0.2–1.2)
BUN: 17 mg/dL (ref 7–25)
CHLORIDE: 103 mmol/L (ref 98–110)
CO2: 25 mmol/L (ref 20–32)
Calcium: 9.5 mg/dL (ref 8.6–10.2)
Creat: 1.04 mg/dL (ref 0.50–1.10)
GFR, Est African American: 74 mL/min/{1.73_m2} (ref >=60)
GFR, Est Non African American: 63 mL/min/{1.73_m2} (ref >=60)
GLUCOSE: 82 mg/dL (ref 65–99)
Globulin: 2.5 g/dL (calc) (ref 1.9–3.7)
POTASSIUM: 4.2 mmol/L (ref 3.5–5.3)
Sodium: 138 mmol/L (ref 135–146)
Total Protein: 7.1 g/dL (ref 6.1–8.1)

## 2018-03-09 LAB — MAGNESIUM: MAGNESIUM: 2.2 mg/dL (ref 1.5–2.5)

## 2018-03-09 LAB — LIPID PANEL
Cholesterol: 210 mg/dL — ABNORMAL HIGH (ref <200)
HDL: 76 mg/dL (ref >=50)
LDL Cholesterol (Calc): 108 mg/dL (calc) — ABNORMAL HIGH
NON-HDL CHOLESTEROL (CALC): 134 mg/dL — AB (ref <130)
TRIGLYCERIDES: 150 mg/dL — AB (ref <150)
Total CHOL/HDL Ratio: 2.8 (calc) (ref <5.0)

## 2018-03-09 LAB — VITAMIN D 25 HYDROXY (VIT D DEFICIENCY, FRACTURES): Vit D, 25-Hydroxy: 35 ng/mL (ref 30–100)

## 2018-03-09 LAB — TSH: TSH: 3.02 m[IU]/L

## 2018-03-09 LAB — MICROALBUMIN / CREATININE URINE RATIO
Creatinine, Urine: 54 mg/dL (ref 20–275)
MICROALB UR: 0.3 mg/dL
MICROALB/CREAT RATIO: 6 ug/mg{creat} (ref <30)

## 2018-03-13 ENCOUNTER — Other Ambulatory Visit: Payer: Self-pay

## 2018-03-13 ENCOUNTER — Other Ambulatory Visit: Payer: Self-pay | Admitting: *Deleted

## 2018-03-13 ENCOUNTER — Encounter: Payer: Self-pay | Admitting: *Deleted

## 2018-03-13 ENCOUNTER — Encounter: Payer: Self-pay | Admitting: Thoracic Surgery (Cardiothoracic Vascular Surgery)

## 2018-03-13 ENCOUNTER — Institutional Professional Consult (permissible substitution): Payer: BLUE CROSS/BLUE SHIELD | Admitting: Thoracic Surgery (Cardiothoracic Vascular Surgery)

## 2018-03-13 VITALS — BP 134/84 | HR 84 | Resp 16 | Ht 65.0 in | Wt 198.0 lb

## 2018-03-13 DIAGNOSIS — K449 Diaphragmatic hernia without obstruction or gangrene: Secondary | ICD-10-CM | POA: Diagnosis not present
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
## 2018-03-16 ENCOUNTER — Telehealth: Payer: Self-pay | Admitting: Genetics

## 2018-03-16 NOTE — Telephone Encounter (Signed)
Revealed negative genetic testing.  Revealed that a VUS's in CDH1, FANCC, and MSH3 were identified.   This normal result is reassuring and indicates that it is unlikely Ms. Crosslin has a hereditary predisposition to cancer syndrome.  However, genetic testing is not perfect, and cannot definitively rule out a hereditary cause.  It will be important for her to keep in contact with genetics to learn if any additional testing may be needed in the future.     We did discuss her Stevan Born cusik risk estimate was just slightly over 20% (21.5) and therefore she could consider high risk breast screening.  We recommended she discuss this with her OBGYN Dr. Seymour Bars (alternatibely high risk clinic is an option for high risk breast cancer consult as well). Ms. Mackellar plans to follow-up with Dr. Seymour Bars about this.   We also recommended her relatives (espeically those affected with cancer) also have genetic tesitng because there could still be a genetic predisposition to cancer in the family that Ms. Mcgilberry did not inherit and therefore was not found in her test.

## 2018-03-19 ENCOUNTER — Ambulatory Visit: Payer: Self-pay | Admitting: Genetics

## 2018-03-19 DIAGNOSIS — Z801 Family history of malignant neoplasm of trachea, bronchus and lung: Secondary | ICD-10-CM

## 2018-03-19 DIAGNOSIS — Z1379 Encounter for other screening for genetic and chromosomal anomalies: Secondary | ICD-10-CM | POA: Insufficient documentation

## 2018-03-19 DIAGNOSIS — Z8042 Family history of malignant neoplasm of prostate: Secondary | ICD-10-CM

## 2018-03-19 DIAGNOSIS — Z803 Family history of malignant neoplasm of breast: Secondary | ICD-10-CM

## 2018-03-19 DIAGNOSIS — Z800 Family history of malignant neoplasm of digestive organs: Secondary | ICD-10-CM

## 2018-03-19 NOTE — Progress Notes (Signed)
HPI:  Ms. Nygren was previously seen in the [facility] on 02/20/2018 due to a family history of cancer and concerns regarding a hereditary predisposition to cancer. Please refer to our prior cancer genetics clinic note for more information regarding Ms. Strider's medical, social and family histories, and our assessment and recommendations, at the time. Ms. Cutright recent genetic test results were disclosed to her, as well as recommendations warranted by these results. These results and recommendations are discussed in more detail below.  CANCER HISTORY:   Ms. Areola is a 48 y.o. female with no personal history of cancer.    FAMILY HISTORY:  We obtained a detailed, 4-generation family history.  Significant diagnoses are listed below: Family History  Problem Relation Age of Onset  . Breast cancer Mother 33  . Lung cancer Mother   . Hypertension Mother   . Diabetes Mother   . Kidney disease Mother   . Hyperlipidemia Mother   . Congestive Heart Failure Mother   . Colon polyps Mother        "multiple"  . Breast cancer Maternal Aunt        dx 50+  . Diabetes Maternal Aunt   . Hyperlipidemia Maternal Aunt   . Heart disease Maternal Aunt   . Hypertension Maternal Aunt   . Stroke Maternal Aunt   . Colon cancer Maternal Aunt        dx 63+  . Colon polyps Maternal Aunt        multiple  . Hyperlipidemia Brother   . Hypertension Brother   . Diabetes Brother   . Colon cancer Maternal Uncle        dx 96+  . Diabetes Maternal Uncle   . Hyperlipidemia Maternal Uncle   . Heart disease Maternal Uncle   . Hypertension Maternal Uncle   . Stroke Maternal Uncle   . Hemochromatosis Paternal Uncle   . Heart disease Maternal Grandmother        Needed pacemaker  . Polycystic ovary syndrome Paternal Aunt   . Colon cancer Maternal Uncle        dx 5+  . Breast cancer Cousin        double mastectomy  . Prostate cancer Cousin   . Prostate cancer Cousin   . Pancreatic  cancer Maternal Aunt        dx 50+  . Throat cancer Maternal Aunt        dx 70+  . Colon cancer Maternal Aunt        dx 66+  . Ovarian cancer Paternal Aunt   . Cervical cancer Paternal Aunt   . Prostate cancer Maternal Uncle        dx 50+    Ms. Vultaggio has no children.  She has a twin brother who has no hx of cancer.  He has a 10 year-old daughter.   Ms. Rinks father: no known hx of cancer- limited information Paternal Aunts/Uncles: limited info- patient knows 1 paternal uncle died of hemochromatosis and 1 paternal aunt had polycystic ovary disease, cervical cancer and ovarian cancer.  Paternal cousins: no info/unk Paternal grandfather: unk Paternal grandmother:unk  Ms. Brereton's mother: died at 72.  She had breast cancer in her 29's and later had lung cancer.  She also had "multiple" colon polyps.  Maternal Aunts/Uncles: 12 maternal aunts/uncles: -2 uncles dx with colon cancer over the age of 32 -1 maternal aunt (mom's twin sister) dx with breast cancer dx 50+ and also colon cancer  dx 50+.  She also had a history of "multiple" colon polyps.  -1 maternal aunt dx with pancreatic cancer 42+ -1 maternal aunt dx with throat cancer -1 maternal aunt dx with colon cancer 77+ -1 maternal aunt or uncle with lung cancer -1 maternal uncle dx with prostate cancer 50+ Maternal cousins: 1 maternal cousin dx with breast cancer- she had a double mastectomy.  Her son was dx with prostate cancer in his late 20's.  2 maternal first cousins dx with prostate cancer.  Maternal grandfather: died in his 3's due to heart failure.  Maternal grandmother:died in her 40's due to heart failure.   Ms. Jerrett is unaware of previous family history of genetic testing for hereditary cancer risks. Patient's maternal ancestors are of N European descent, and paternal ancestors are of N. European descent. There is no reported Ashkenazi Jewish ancestry. There is no known consanguinity.  GENETIC TEST  RESULTS: Genetic testing performed through [facility] panel reported out on 03/14/2018 showed no pathogenic mutations.   Genes Analyzed (80 total): AIP, ALK, APC*, ATM*, AXIN2, BAP1, BARD1, BLM, BMPR1A, BRCA1*, BRCA2*, BRIP1*, CDC73, CDH1*, CDK4, CDKN1B, CDKN2A, CHEK2*, CTNNA1, DICER1, FANCC, FH, FLCN, GALNT12, HOXB13, KIT, MAX, MEN1, MET, MLH1*, MRE11A, MSH2*, MSH3, MSH6*, MUTYH*, NBN, NF1*, NF2, NTHL1, PALB2*, PDGFRA, PHOX2B, PMS2*, POLD1, POLE, POT1, PRKAR1A, PTCH1, PTEN*, RAD50, RAD51C*, RAD51D*, RB1, RET, SDHA, SDHAF2, SDHB, SDHC, SDHD, SMAD4, SMARCA4, SMARCB1, SMARCE1, STK11, SUFU, TMEM127, TP53*, TSC1, TSC2, VHL and XRCC2 (sequencing and deletion/duplication); CASR, CPA1, CTRC, EGFR, MITF, PRSS1 and SPINK1 (sequencing only); EPCAM and GREM1 (deletion/duplication only). DNA and RNA analyses performed for * genes.  A variant of uncertain significance (VUS) in a gene called CDH1 was also noted. (c.2600A>G) A variant of uncertain significance (VUS) in a gene called [facility] was also noted. (c.934A>G) A variant of uncertain significance (VUS) in a gene called MSH3 was also noted. (c.2732T>G).  The test report will be scanned into EPIC and will be located under the Molecular Pathology section of the Results Review tab. A portion of the result report is included below for reference.     We discussed with Ms. Montalban that because current genetic testing is not perfect, it is possible there may be a gene mutation in one of these genes that current testing cannot detect, but that chance is small.  We also discussed, that there could be another gene that has not yet been discovered, or that we have not yet tested, that is responsible for the cancer diagnoses in the family. It is also possible there is a hereditary cause for the cancer in the family that Ms. Mui did not inherit and therefore was not identified in her testing.  Therefore, it is important to remain in touch with  cancer genetics in the future so that we can continue to offer Ms. Wyffels the most up to date genetic testing.   Regarding the VUS's in [place], [facility], and MSH3: At this time, it is unknown if these variants are associated with increased cancer risk or if they are normal findings, but most variants such as these get reclassified to being inconsequential. They should not be used to make medical management decisions. With time, we suspect the lab will determine the significance of these variants, if any. If we do learn more about them, we will try to contact Ms. Wittmann to discuss it further. However, it is important to stay in touch with [place] periodically and keep the address and phone number up to date.  ADDITIONAL GENETIC TESTING: We discussed with Ms. Devlin that her genetic testing was fairly extensive.  If there are are genes identified to increase cancer risk that can be analyzed in the future, we would be happy to discuss and coordinate this testing at that time.    CANCER SCREENING RECOMMENDATIONS: Ms. Bradshaw test result is considered negative (normal).  This means that we have not identified a hereditary predisposition to cancer in her at this time.   While reassuring, this does not definitively rule out a hereditary predisposition to cancer. It is still possible that there could be genetic mutations that are undetectable by current technology, or genetic mutations in genes that have not been tested or identified to increase cancer risk.  Therefore, it is recommended she continue to follow the cancer management and screening guidelines provided by her oncology and primary healthcare provider. An individual's cancer risk is not determined by genetic test results alone.  Overall cancer risk assessment includes additional factors such as personal medical history, family history, etc.  These should be used to make a personalized plan for cancer prevention and surveillance.    Based on the  patient's personal and family history, the statistical model (Tyrer cusik)   Was used to estimate her risk of developing breast cancer. This estimates her lifetime risk of developing breast cancer to be approximately 21.5%.  The patient's lifetime breast cancer risk is a preliminary estimate based on available information using one of several models endorsed by the [place] (ACS). The ACS recommends consideration of breast MRI screening as an adjunct to mammography for patients at high risk (defined as 20% or greater lifetime risk). A more detailed breast cancer risk assessment can be considered, if clinically indicated.   Ms. Lebeck has been determined to be at high risk for breast cancer. Therefore, we recommend that annual screening with mammography and breast MRI begin at age 13, or 10 years prior to the age of breast cancer diagnosis in a relative (whichever is earlier). We discussed that Ms. Lomanto should discuss her individual situation with her referring physician and determine a breast cancer screening plan with which they are both comfortable.        RECOMMENDATIONS FOR FAMILY MEMBERS:  Relatives in this family might be at some increased risk of developing cancer, over the general population risk, simply due to the family history of cancer.  We recommended women in this family have a yearly mammogram beginning at age 70, or 4 years younger than the earliest onset of cancer, an annual clinical breast exam, and perform monthly breast self-exams. Women in this family should also have a gynecological exam as recommended by their primary provider. All family members should have a colonoscopy by age 19 (or as directed by their doctors).  All family members should inform their physicians about the family history of cancer so their doctors can make the most appropriate screening recommendations for them.   It is also possible there is a hereditary cause for the cancer in Ms.  Bacigalupo's family that she did not inherit and therefore was not identified in her.  Therefore, we recommended siblings and maternal relatives (especially those affected with cancer) also have genetic counseling and testing. Ms. Sperl will let us know if we can be of any assistance in coordinating genetic counseling and/or testing for these family members.   FOLLOW-UP: Lastly, we discussed with Ms. Hege that cancer genetics is a rapidly advancing field and it is possible that new genetic  tests will be appropriate for her and/or her family members in the future. We encouraged her to remain in contact with cancer genetics on an annual basis so we can update her personal and family histories and let her know of advances in cancer genetics that may benefit this family.   Our contact number was provided. Ms. Dangler questions were answered to her satisfaction, and she knows she is welcome to call us at anytime with additional questions or concerns.   Ferol Luz, MS, [facility] Certified Genetic Counselor [email or web address]

## 2018-03-20 NOTE — Pre-Procedure Instructions (Signed)
KEMAURI RIVERON  03/20/2018      [id number] [place], [place] - [address] [place] [address] Phone: [phone] Fax: [phone]    Your procedure is scheduled on Feb. 21  Report to [facility] Entrance A at 5:30 A.M.  Call this number if you have problems the morning of surgery:  [phone]   Remember:  Do not eat or drink after midnight.      Take these medicines the morning of surgery with A SIP OF WATER :              Albuterol inhaler if needed--bring to hospital             Baclofen (lioresal)             Bupropion (welllbutrin)             escitalopram (lexapro)             flonase nasal spray              Omeprazole (prilosec)            7 days prior to surgery STOP taking any  Aleve, Naproxen, Ibuprofen, Motrin, Advil, Goody's, BC's, all herbal medications, fish oil, and all vitamins, mobic                           Do not wear jewelry, make-up or nail polish.  Do not wear lotions, powders, or perfumes, or deodorant.  Do not shave 48 hours prior to surgery.  Men may shave face and neck.  Do not bring valuables to the hospital.  [facility] is not responsible for any belongings or valuables.  Contacts, dentures or bridgework may not be worn into surgery.  Leave your suitcase in the car.  After surgery it may be brought to your room.  For patients admitted to the hospital, discharge time will be determined by your treatment team.  Patients discharged the day of surgery will not be allowed to drive home.    Special instructions:  [facility]- Preparing For Surgery  Before surgery, you can play an important role. Because skin is not sterile, your skin needs to be as free of germs as possible. You can reduce the number of germs on your skin by washing with CHG (chlorahexidine gluconate) Soap before surgery.  CHG is an antiseptic cleaner which kills germs and bonds with the skin to continue killing germs even after  washing.    Oral Hygiene is also important to reduce your risk of infection.  Remember - BRUSH YOUR TEETH THE MORNING OF SURGERY WITH YOUR REGULAR TOOTHPASTE  Please do not use if you have an allergy to CHG or antibacterial soaps. If your skin becomes reddened/irritated stop using the CHG.  Do not shave (including legs and underarms) for at least 48 hours prior to first CHG shower. It is OK to shave your face.  Please follow these instructions carefully.   1. Shower the NIGHT BEFORE SURGERY and the MORNING OF SURGERY with CHG.   2. If you chose to wash your hair, wash your hair first as usual with your normal shampoo.  3. After you shampoo, rinse your hair and body thoroughly to remove the shampoo.  4. Use CHG as you would any other liquid soap. You can apply CHG directly to the skin and wash gently with a scrungie or a clean washcloth.   5.  Apply the CHG Soap to your body ONLY FROM THE NECK DOWN.  Do not use on open wounds or open sores. Avoid contact with your eyes, ears, mouth and genitals (private parts). Wash Face and genitals (private parts)  with your normal soap.  6. Wash thoroughly, paying special attention to the area where your surgery will be performed.  7. Thoroughly rinse your body with warm water from the neck down.  8. DO NOT shower/wash with your normal soap after using and rinsing off the CHG Soap.  9. Pat yourself dry with a CLEAN TOWEL.  10. Wear CLEAN PAJAMAS to bed the night before surgery, wear comfortable clothes the morning of surgery  11. Place CLEAN SHEETS on your bed the night of your first shower and DO NOT SLEEP WITH PETS.    Day of Surgery:  Do not apply any deodorants/lotions.  Please wear clean clothes to the hospital/surgery center.   Remember to brush your teeth WITH YOUR REGULAR TOOTHPASTE.    Please read over the following fact sheets that you were given. Coughing and Deep Breathing, MRSA Information and Surgical Site Infection  Prevention

## 2018-03-21 ENCOUNTER — Encounter (HOSPITAL_COMMUNITY): Payer: Self-pay | Admitting: *Deleted

## 2018-03-21 ENCOUNTER — Other Ambulatory Visit: Payer: Self-pay

## 2018-03-21 ENCOUNTER — Encounter: Payer: Self-pay | Admitting: Genetics

## 2018-03-21 ENCOUNTER — Encounter (HOSPITAL_COMMUNITY)
Admission: RE | Admit: 2018-03-21 | Discharge: 2018-03-21 | Disposition: A | Discharge status: Home / Self Care | Payer: BLUE CROSS/BLUE SHIELD | Source: Ambulatory Visit | Attending: Thoracic Surgery (Cardiothoracic Vascular Surgery) | Admitting: Thoracic Surgery (Cardiothoracic Vascular Surgery)

## 2018-03-21 ENCOUNTER — Ambulatory Visit (HOSPITAL_COMMUNITY)
Admission: RE | Admit: 2018-03-21 | Discharge: 2018-03-21 | Disposition: A | Discharge status: Home / Self Care | Payer: BLUE CROSS/BLUE SHIELD | Source: Ambulatory Visit | Attending: Thoracic Surgery (Cardiothoracic Vascular Surgery) | Admitting: Thoracic Surgery (Cardiothoracic Vascular Surgery)

## 2018-03-21 DIAGNOSIS — K449 Diaphragmatic hernia without obstruction or gangrene: Secondary | ICD-10-CM

## 2018-03-21 DIAGNOSIS — Z01811 Encounter for preprocedural respiratory examination: Secondary | ICD-10-CM | POA: Diagnosis not present

## 2018-03-21 HISTORY — DX: Gastro-esophageal reflux disease without esophagitis: K21.9

## 2018-03-21 HISTORY — DX: Personal history of urinary calculi: Z87.442

## 2018-03-21 HISTORY — DX: Pneumonia, unspecified organism: J18.9

## 2018-03-21 HISTORY — DX: Unspecified osteoarthritis, unspecified site: M19.90

## 2018-03-21 HISTORY — DX: Dyspnea, unspecified: R06.00

## 2018-03-21 LAB — URINALYSIS, ROUTINE W REFLEX MICROSCOPIC
Bilirubin Urine: NEGATIVE
Glucose, UA: NEGATIVE mg/dL
Hgb urine dipstick: NEGATIVE
Ketones, ur: NEGATIVE mg/dL
Leukocytes,Ua: NEGATIVE
NITRITE: NEGATIVE
Protein, ur: NEGATIVE mg/dL
Specific Gravity, Urine: 1.019 (ref 1.005–1.030)
pH: 6 (ref 5.0–8.0)

## 2018-03-21 LAB — COMPREHENSIVE METABOLIC PANEL
ALT: 26 U/L (ref 0–44)
AST: 25 U/L (ref 15–41)
Albumin: 3.8 g/dL (ref 3.5–5.0)
Alkaline Phosphatase: 62 U/L (ref 38–126)
Anion gap: 12 (ref 5–15)
BUN: 11 mg/dL (ref 6–20)
CO2: 20 mmol/L — ABNORMAL LOW (ref 22–32)
Calcium: 9.3 mg/dL (ref 8.9–10.3)
Chloride: 104 mmol/L (ref 98–111)
Creatinine, Ser: 0.93 mg/dL (ref 0.44–1.00)
GFR calc Af Amer: >60 mL/min (ref >60)
Glucose, Bld: 93 mg/dL (ref 70–99)
Potassium: 4.1 mmol/L (ref 3.5–5.1)
Sodium: 136 mmol/L (ref 135–145)
Total Bilirubin: 0.7 mg/dL (ref 0.3–1.2)
Total Protein: 6.9 g/dL (ref 6.5–8.1)

## 2018-03-21 LAB — CBC
HCT: 43.1 % (ref 36.0–46.0)
HEMOGLOBIN: 13.6 g/dL (ref 12.0–15.0)
MCH: 30.8 pg (ref 26.0–34.0)
MCHC: 31.6 g/dL (ref 30.0–36.0)
MCV: 97.7 fL (ref 80.0–100.0)
Platelets: 285 10*3/uL (ref 150–400)
RBC: 4.41 MIL/uL (ref 3.87–5.11)
RDW: 12.3 % (ref 11.5–15.5)
WBC: 5.4 10*3/uL (ref 4.0–10.5)
nRBC: 0 % (ref 0.0–0.2)

## 2018-03-21 LAB — ABO/RH: ABO/RH(D): A POS

## 2018-03-21 LAB — BLOOD GAS, ARTERIAL
Acid-base deficit: 0.6 mmol/L (ref 0.0–2.0)
Bicarbonate: 23.2 mmol/L (ref 20.0–28.0)
Drawn by: 449841
FIO2: 21
O2 Saturation: 96.8 %
PH ART: 7.427 (ref 7.350–7.450)
Patient temperature: 98.6
pCO2 arterial: 35.9 mmHg (ref 32.0–48.0)
pO2, Arterial: 89.4 mmHg (ref 83.0–108.0)

## 2018-03-21 LAB — TYPE AND SCREEN
ABO/RH(D): A POS
Antibody Screen: NEGATIVE

## 2018-03-21 LAB — SURGICAL PCR SCREEN
MRSA, PCR: NEGATIVE
Staphylococcus aureus: NEGATIVE

## 2018-03-21 LAB — APTT: aPTT: 26 seconds (ref 24–36)

## 2018-03-21 LAB — PROTIME-INR
INR: 1
PROTHROMBIN TIME: 13.1 s (ref 11.4–15.2)

## 2018-03-21 NOTE — Progress Notes (Signed)
PCP - [facility] Cardiologist - none  Chest x-ray - today EKG - 03-08-18 Stress Test - na ECHO - na Cardiac Cath - na  Sleep Study - na CPAP -   Fasting Blood Sugar - na Checks Blood Sugar _____ times a day  Blood Thinner Instructions: Aspirin Instructions: pt. Stopped 1 month ago  Anesthesia review:  Pt. Reports she has has chest pain for years that is related to the herniated liver that has shifted up into the chest, that comes and goes,can be sharp at times and last 3-4 seconds, goes away and can come back. Sometimes it doesn't happen for several days. Notified Antionette Poles, [place].   Patient denies shortness of breath, fever, cough and chest pain at PAT appointment   Patient verbalized understanding of instructions that were given to them at the PAT appointment. Patient was also instructed that they will need to review over the PAT instructions again at home before surgery.

## 2018-03-21 NOTE — Anesthesia Preprocedure Evaluation (Addendum)
Anesthesia Evaluation  Patient identified by MRN, date of birth, ID band Patient awake    Reviewed: Allergy & Precautions, NPO status , Patient's Chart, lab work & pertinent test results  Airway Mallampati: II  TM Distance: >3 FB Neck ROM: Full    Dental no notable dental hx. (+) Dental Advisory Given, Teeth Intact   Pulmonary shortness of breath, pneumonia,    Pulmonary exam normal breath sounds clear to auscultation       Cardiovascular hypertension, negative cardio ROS Normal cardiovascular exam Rhythm:Regular Rate:Normal     Neuro/Psych PSYCHIATRIC DISORDERS Anxiety Depression  Neuromuscular disease    GI/Hepatic Neg liver ROS, GERD  ,  Endo/Other  negative endocrine ROS  Renal/GU negative Renal ROS     Musculoskeletal  (+) Arthritis ,   Abdominal (+) + obese,   Peds  Hematology negative hematology ROS (+)   Anesthesia Other Findings Day of surgery medications reviewed with the patient.  Reproductive/Obstetrics                                                            Anesthesia Evaluation  Patient identified by MRN, date of birth, ID band Patient awake    Reviewed: Allergy & Precautions, H&P , NPO status , Patient's Chart, lab work & pertinent test results, reviewed documented beta blocker date and time   Airway Mallampati: I TM Distance: >3 FB Neck ROM: full    Dental  (+) Teeth Intact   Pulmonary neg pulmonary ROS,  clear to auscultation  Pulmonary exam normal       Cardiovascular     Neuro/Psych PSYCHIATRIC DISORDERS Depression Negative Neurological ROS     GI/Hepatic negative GI ROS, Neg liver ROS,   Endo/Other  Negative Endocrine ROS  Renal/GU negative Renal ROS  Genitourinary negative   Musculoskeletal negative musculoskeletal ROS (+)   Abdominal Normal abdominal exam  (+)   Peds negative pediatric ROS (+)  Hematology negative hematology  ROS (+)   Anesthesia Other Findings   Reproductive/Obstetrics negative OB ROS                           Anesthesia Physical Anesthesia Plan  ASA: II  Anesthesia Plan: General   Post-op Pain Management:    Induction: Intravenous  Airway Management Planned: LMA  Additional Equipment:   Intra-op Plan:   Post-operative Plan:   Informed Consent: I have reviewed the patients History and Physical, chart, labs and discussed the procedure including the risks, benefits and alternatives for the proposed anesthesia with the patient or authorized representative who has indicated his/her understanding and acceptance.     Plan Discussed with: CRNA  Anesthesia Plan Comments:         Anesthesia Quick Evaluation  Anesthesia Physical Anesthesia Plan  ASA: II  Anesthesia Plan: General   Post-op Pain Management:    Induction: Intravenous  PONV Risk Score and Plan: 4 or greater and Ondansetron, Treatment may vary due to age or medical condition, Dexamethasone and Midazolam  Airway Management Planned: Double Lumen EBT  Additional Equipment: None  Intra-op Plan:   Post-operative Plan: Extubation in OR  Informed Consent: I have reviewed the patients History and Physical, chart, labs and discussed the procedure including the risks, benefits and alternatives for the  proposed anesthesia with the patient or authorized representative who has indicated his/her understanding and acceptance.     Dental advisory given  Plan Discussed with: CRNA  Anesthesia Plan Comments: (Reported episodes of subcostal and bilateral chest pain at PAT. She also reported this to Dr. 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 Normal EKG 03/08/18.)      Anesthesia Quick Evaluation

## 2018-03-23 ENCOUNTER — Encounter (HOSPITAL_COMMUNITY): Payer: Self-pay

## 2018-03-23 ENCOUNTER — Encounter (HOSPITAL_COMMUNITY)
Admission: RE | Disposition: A | Discharge status: Home / Self Care | Payer: Self-pay | Source: Home / Self Care | Attending: Thoracic Surgery (Cardiothoracic Vascular Surgery)

## 2018-03-23 ENCOUNTER — Inpatient Hospital Stay (HOSPITAL_COMMUNITY): Payer: BLUE CROSS/BLUE SHIELD | Admitting: Physician Assistant

## 2018-03-23 ENCOUNTER — Inpatient Hospital Stay (HOSPITAL_COMMUNITY)
Admission: RE | Admit: 2018-03-23 | Discharge: 2018-03-27 | DRG: 165 | Disposition: A | Discharge status: Home / Self Care | Payer: BLUE CROSS/BLUE SHIELD | Attending: Thoracic Surgery (Cardiothoracic Vascular Surgery) | Admitting: Thoracic Surgery (Cardiothoracic Vascular Surgery)

## 2018-03-23 ENCOUNTER — Inpatient Hospital Stay (HOSPITAL_COMMUNITY): Payer: BLUE CROSS/BLUE SHIELD

## 2018-03-23 ENCOUNTER — Other Ambulatory Visit: Payer: Self-pay

## 2018-03-23 ENCOUNTER — Inpatient Hospital Stay (HOSPITAL_COMMUNITY): Payer: BLUE CROSS/BLUE SHIELD | Admitting: Certified Registered"

## 2018-03-23 DIAGNOSIS — I10 Essential (primary) hypertension: Secondary | ICD-10-CM | POA: Diagnosis present

## 2018-03-23 DIAGNOSIS — Z79899 Other long term (current) drug therapy: Secondary | ICD-10-CM

## 2018-03-23 DIAGNOSIS — Q791 Other congenital malformations of diaphragm: Principal | ICD-10-CM

## 2018-03-23 DIAGNOSIS — F418 Other specified anxiety disorders: Secondary | ICD-10-CM | POA: Diagnosis present

## 2018-03-23 DIAGNOSIS — N200 Calculus of kidney: Secondary | ICD-10-CM | POA: Diagnosis not present

## 2018-03-23 DIAGNOSIS — Z6832 Body mass index (BMI) 32.0-32.9, adult: Secondary | ICD-10-CM | POA: Diagnosis not present

## 2018-03-23 DIAGNOSIS — Z8249 Family history of ischemic heart disease and other diseases of the circulatory system: Secondary | ICD-10-CM

## 2018-03-23 DIAGNOSIS — R1310 Dysphagia, unspecified: Secondary | ICD-10-CM | POA: Diagnosis not present

## 2018-03-23 DIAGNOSIS — J939 Pneumothorax, unspecified: Secondary | ICD-10-CM | POA: Diagnosis not present

## 2018-03-23 DIAGNOSIS — F419 Anxiety disorder, unspecified: Secondary | ICD-10-CM | POA: Diagnosis not present

## 2018-03-23 DIAGNOSIS — E785 Hyperlipidemia, unspecified: Secondary | ICD-10-CM | POA: Diagnosis not present

## 2018-03-23 DIAGNOSIS — R0602 Shortness of breath: Secondary | ICD-10-CM | POA: Diagnosis not present

## 2018-03-23 DIAGNOSIS — Z7982 Long term (current) use of aspirin: Secondary | ICD-10-CM

## 2018-03-23 DIAGNOSIS — K449 Diaphragmatic hernia without obstruction or gangrene: Secondary | ICD-10-CM | POA: Diagnosis not present

## 2018-03-23 DIAGNOSIS — Z4682 Encounter for fitting and adjustment of non-vascular catheter: Secondary | ICD-10-CM

## 2018-03-23 DIAGNOSIS — K219 Gastro-esophageal reflux disease without esophagitis: Secondary | ICD-10-CM | POA: Diagnosis present

## 2018-03-23 DIAGNOSIS — Z888 Allergy status to other drugs, medicaments and biological substances status: Secondary | ICD-10-CM

## 2018-03-23 DIAGNOSIS — F329 Major depressive disorder, single episode, unspecified: Secondary | ICD-10-CM | POA: Diagnosis present

## 2018-03-23 DIAGNOSIS — K8020 Calculus of gallbladder without cholecystitis without obstruction: Secondary | ICD-10-CM | POA: Diagnosis not present

## 2018-03-23 DIAGNOSIS — Z9889 Other specified postprocedural states: Secondary | ICD-10-CM

## 2018-03-23 DIAGNOSIS — Z7951 Long term (current) use of inhaled steroids: Secondary | ICD-10-CM

## 2018-03-23 DIAGNOSIS — Z881 Allergy status to other antibiotic agents status: Secondary | ICD-10-CM

## 2018-03-23 DIAGNOSIS — J9811 Atelectasis: Secondary | ICD-10-CM | POA: Diagnosis not present

## 2018-03-23 HISTORY — PX: VIDEO ASSISTED THORACOSCOPY (VATS)/THOROCOTOMY: SHX6173

## 2018-03-23 HISTORY — DX: Diaphragmatic hernia without obstruction or gangrene: K44.9

## 2018-03-23 HISTORY — PX: EPIGASTRIC HERNIA REPAIR: SHX404

## 2018-03-23 LAB — POCT PREGNANCY, URINE: Preg Test, Ur: NEGATIVE

## 2018-03-23 SURGERY — VIDEO ASSISTED THORACOSCOPY (VATS)/THOROCOTOMY
Anesthesia: Epidural | Site: Chest | Laterality: Right

## 2018-03-23 MED ORDER — CEFAZOLIN SODIUM-DEXTROSE 2-4 GM/100ML-% IV SOLN
2.0000 g | Freq: Three times a day (TID) | INTRAVENOUS | Status: AC
  Administered 2018-03-23 – 2018-03-24 (×2): 2 g via INTRAVENOUS
  Filled 2018-03-23 (×2): qty 100

## 2018-03-23 MED ORDER — SODIUM CHLORIDE 0.9 % IV SOLN
INTRAVENOUS | Status: DC | PRN
  Administered 2018-03-23: 35 ug/min via INTRAVENOUS

## 2018-03-23 MED ORDER — PROPOFOL 10 MG/ML IV BOLUS
INTRAVENOUS | Status: AC
  Filled 2018-03-23: qty 20

## 2018-03-23 MED ORDER — LACTATED RINGERS IV SOLN
INTRAVENOUS | Status: DC | PRN
  Administered 2018-03-23 (×2): via INTRAVENOUS

## 2018-03-23 MED ORDER — ASPIRIN EC 81 MG PO TBEC
81.0000 mg | DELAYED_RELEASE_TABLET | Freq: Every day | ORAL | Status: DC
  Administered 2018-03-24 – 2018-03-27 (×4): 81 mg via ORAL
  Filled 2018-03-23 (×4): qty 1

## 2018-03-23 MED ORDER — PROPOFOL 10 MG/ML IV BOLUS
INTRAVENOUS | Status: DC | PRN
  Administered 2018-03-23: 160 mg via INTRAVENOUS

## 2018-03-23 MED ORDER — SENNOSIDES-DOCUSATE SODIUM 8.6-50 MG PO TABS
1.0000 | ORAL_TABLET | Freq: Every day | ORAL | Status: DC
  Administered 2018-03-24 – 2018-03-25 (×2): 1 via ORAL
  Filled 2018-03-23 (×3): qty 1

## 2018-03-23 MED ORDER — EPHEDRINE SULFATE-NACL 50-0.9 MG/10ML-% IV SOSY
PREFILLED_SYRINGE | INTRAVENOUS | Status: DC | PRN
  Administered 2018-03-23: 5 mg via INTRAVENOUS

## 2018-03-23 MED ORDER — SUGAMMADEX SODIUM 200 MG/2ML IV SOLN
INTRAVENOUS | Status: DC | PRN
  Administered 2018-03-23: 200 mg via INTRAVENOUS

## 2018-03-23 MED ORDER — SODIUM CHLORIDE 0.9% FLUSH: 9.0000 mL | INTRAVENOUS | Status: DC | PRN

## 2018-03-23 MED ORDER — PROMETHAZINE HCL 25 MG/ML IJ SOLN
INTRAMUSCULAR | Status: AC
  Administered 2018-03-23: 6.25 mg via INTRAVENOUS
  Filled 2018-03-23: qty 1

## 2018-03-23 MED ORDER — SODIUM CHLORIDE (PF) 0.9 % IJ SOLN
INTRAMUSCULAR | Status: DC | PRN
  Administered 2018-03-23: 50 mL via INTRAVENOUS

## 2018-03-23 MED ORDER — ESCITALOPRAM OXALATE 10 MG PO TABS
20.0000 mg | ORAL_TABLET | Freq: Every day | ORAL | Status: DC
  Administered 2018-03-24 – 2018-03-27 (×5): 20 mg via ORAL
  Filled 2018-03-23 (×5): qty 2

## 2018-03-23 MED ORDER — HYDROMORPHONE HCL 1 MG/ML IJ SOLN
INTRAMUSCULAR | Status: AC
  Administered 2018-03-23: 0.5 mg via INTRAVENOUS
  Filled 2018-03-23: qty 1

## 2018-03-23 MED ORDER — SODIUM CHLORIDE 0.9 % IV SOLN
INTRAVENOUS | Status: DC
  Administered 2018-03-23: 18:00:00 via INTRAVENOUS

## 2018-03-23 MED ORDER — IRBESARTAN 150 MG PO TABS
150.0000 mg | ORAL_TABLET | Freq: Every day | ORAL | Status: DC
  Administered 2018-03-24 – 2018-03-25 (×2): 150 mg via ORAL
  Filled 2018-03-23 (×2): qty 1

## 2018-03-23 MED ORDER — PRAVASTATIN SODIUM 40 MG PO TABS
40.0000 mg | ORAL_TABLET | Freq: Every day | ORAL | Status: DC
  Administered 2018-03-24 – 2018-03-26 (×3): 40 mg via ORAL
  Filled 2018-03-23 (×4): qty 1

## 2018-03-23 MED ORDER — DIPHENHYDRAMINE HCL 50 MG/ML IJ SOLN: 12.5000 mg | Freq: Four times a day (QID) | INTRAMUSCULAR | Status: DC | PRN

## 2018-03-23 MED ORDER — BUPIVACAINE HCL (PF) 0.5 % IJ SOLN: INTRAMUSCULAR | Status: DC | PRN

## 2018-03-23 MED ORDER — BUPIVACAINE LIPOSOME 1.3 % IJ SUSP
INTRAMUSCULAR | Status: DC | PRN
  Administered 2018-03-23: 91 mL

## 2018-03-23 MED ORDER — FENTANYL CITRATE (PF) 250 MCG/5ML IJ SOLN
INTRAMUSCULAR | Status: AC
  Filled 2018-03-23: qty 5

## 2018-03-23 MED ORDER — INFLUENZA VAC SPLIT QUAD 0.5 ML IM SUSY
0.5000 mL | PREFILLED_SYRINGE | INTRAMUSCULAR | Status: DC
  Filled 2018-03-23: qty 0.5

## 2018-03-23 MED ORDER — BACLOFEN 5 MG HALF TABLET: 5.0000 mg | ORAL_TABLET | Freq: Two times a day (BID) | ORAL | Status: DC | PRN

## 2018-03-23 MED ORDER — BUPIVACAINE HCL (PF) 0.5 % IJ SOLN
INTRAMUSCULAR | Status: AC
  Filled 2018-03-23: qty 30

## 2018-03-23 MED ORDER — CEFAZOLIN SODIUM-DEXTROSE 2-4 GM/100ML-% IV SOLN
INTRAVENOUS | Status: AC
  Filled 2018-03-23: qty 100

## 2018-03-23 MED ORDER — MEPERIDINE HCL 50 MG/ML IJ SOLN: 6.2500 mg | INTRAMUSCULAR | Status: DC | PRN

## 2018-03-23 MED ORDER — ONDANSETRON HCL 4 MG/2ML IJ SOLN
4.0000 mg | Freq: Four times a day (QID) | INTRAMUSCULAR | Status: DC | PRN
  Administered 2018-03-23 – 2018-03-26 (×4): 4 mg via INTRAVENOUS
  Filled 2018-03-23 (×4): qty 2

## 2018-03-23 MED ORDER — HYDROMORPHONE HCL 1 MG/ML IJ SOLN
0.2500 mg | INTRAMUSCULAR | Status: DC | PRN
  Administered 2018-03-23 (×2): 0.5 mg via INTRAVENOUS

## 2018-03-23 MED ORDER — ALBUTEROL SULFATE (2.5 MG/3ML) 0.083% IN NEBU: 2.5000 mg | INHALATION_SOLUTION | RESPIRATORY_TRACT | Status: DC

## 2018-03-23 MED ORDER — FENTANYL CITRATE (PF) 100 MCG/2ML IJ SOLN
INTRAMUSCULAR | Status: DC | PRN
  Administered 2018-03-23 (×5): 50 ug via INTRAVENOUS

## 2018-03-23 MED ORDER — MIDAZOLAM HCL 2 MG/2ML IJ SOLN
INTRAMUSCULAR | Status: AC
  Filled 2018-03-23: qty 2

## 2018-03-23 MED ORDER — PHENYLEPHRINE 40 MCG/ML (10ML) SYRINGE FOR IV PUSH (FOR BLOOD PRESSURE SUPPORT)
PREFILLED_SYRINGE | INTRAVENOUS | Status: DC | PRN
  Administered 2018-03-23 (×2): 80 ug via INTRAVENOUS

## 2018-03-23 MED ORDER — MIDAZOLAM HCL 5 MG/5ML IJ SOLN
INTRAMUSCULAR | Status: DC | PRN
  Administered 2018-03-23 (×2): 1 mg via INTRAVENOUS

## 2018-03-23 MED ORDER — NALOXONE HCL 0.4 MG/ML IJ SOLN: 0.4000 mg | INTRAMUSCULAR | Status: DC | PRN

## 2018-03-23 MED ORDER — ONDANSETRON HCL 4 MG/2ML IJ SOLN: 4.0000 mg | Freq: Four times a day (QID) | INTRAMUSCULAR | Status: DC | PRN

## 2018-03-23 MED ORDER — MONTELUKAST SODIUM 10 MG PO TABS
10.0000 mg | ORAL_TABLET | Freq: Every day | ORAL | Status: DC
  Administered 2018-03-23 – 2018-03-26 (×4): 10 mg via ORAL
  Filled 2018-03-23 (×4): qty 1

## 2018-03-23 MED ORDER — ACETAMINOPHEN 160 MG/5ML PO SOLN: 1000.0000 mg | Freq: Four times a day (QID) | ORAL | Status: DC

## 2018-03-23 MED ORDER — ALBUTEROL SULFATE (2.5 MG/3ML) 0.083% IN NEBU
2.5000 mg | INHALATION_SOLUTION | RESPIRATORY_TRACT | Status: DC
  Administered 2018-03-23 – 2018-03-24 (×3): 2.5 mg via RESPIRATORY_TRACT
  Filled 2018-03-23 (×3): qty 3

## 2018-03-23 MED ORDER — ENOXAPARIN SODIUM 40 MG/0.4ML ~~LOC~~ SOLN
40.0000 mg | Freq: Every day | SUBCUTANEOUS | Status: DC
  Administered 2018-03-24 – 2018-03-26 (×3): 40 mg via SUBCUTANEOUS
  Filled 2018-03-23 (×4): qty 0.4

## 2018-03-23 MED ORDER — PROMETHAZINE HCL 25 MG/ML IJ SOLN
6.2500 mg | INTRAMUSCULAR | Status: DC | PRN
  Administered 2018-03-23: 6.25 mg via INTRAVENOUS

## 2018-03-23 MED ORDER — ROCURONIUM BROMIDE 10 MG/ML (PF) SYRINGE
PREFILLED_SYRINGE | INTRAVENOUS | Status: DC | PRN
  Administered 2018-03-23: 50 mg via INTRAVENOUS
  Administered 2018-03-23: 20 mg via INTRAVENOUS
  Administered 2018-03-23 (×2): 10 mg via INTRAVENOUS

## 2018-03-23 MED ORDER — HYDROMORPHONE 1 MG/ML IV SOLN
INTRAVENOUS | Status: DC
  Administered 2018-03-23: 25 mg via INTRAVENOUS
  Administered 2018-03-23: 3 mg via INTRAVENOUS
  Administered 2018-03-24: 12 mg via INTRAVENOUS
  Administered 2018-03-24: 3.3 mg via INTRAVENOUS
  Administered 2018-03-24: 3 mg via INTRAVENOUS
  Administered 2018-03-24: 25 mg via INTRAVENOUS
  Administered 2018-03-24: 1.2 mg via INTRAVENOUS
  Administered 2018-03-24: 2.1 mg via INTRAVENOUS
  Administered 2018-03-25: 0.6 mg via INTRAVENOUS
  Administered 2018-03-25: 3.6 mg via INTRAVENOUS
  Administered 2018-03-26: 1.8 mg via INTRAVENOUS
  Administered 2018-03-26: 0.9 mg via INTRAVENOUS
  Filled 2018-03-23 (×2): qty 25

## 2018-03-23 MED ORDER — POTASSIUM CHLORIDE 10 MEQ/50ML IV SOLN: 10.0000 meq | Freq: Every day | INTRAVENOUS | Status: DC | PRN

## 2018-03-23 MED ORDER — BUPROPION HCL ER (XL) 300 MG PO TB24
300.0000 mg | ORAL_TABLET | Freq: Every day | ORAL | Status: DC
  Administered 2018-03-24 – 2018-03-27 (×4): 300 mg via ORAL
  Filled 2018-03-23 (×4): qty 1

## 2018-03-23 MED ORDER — DIPHENHYDRAMINE HCL 12.5 MG/5ML PO ELIX
12.5000 mg | ORAL_SOLUTION | Freq: Four times a day (QID) | ORAL | Status: DC | PRN
  Filled 2018-03-23: qty 5

## 2018-03-23 MED ORDER — LACTATED RINGERS IV SOLN
INTRAVENOUS | Status: DC | PRN
  Administered 2018-03-23: 07:00:00 via INTRAVENOUS

## 2018-03-23 MED ORDER — LIDOCAINE 2% (20 MG/ML) 5 ML SYRINGE
INTRAMUSCULAR | Status: DC | PRN
  Administered 2018-03-23: 100 mg via INTRAVENOUS

## 2018-03-23 MED ORDER — 0.9 % SODIUM CHLORIDE (POUR BTL) OPTIME
TOPICAL | Status: DC | PRN
  Administered 2018-03-23: 2000 mL

## 2018-03-23 MED ORDER — BISACODYL 5 MG PO TBEC
10.0000 mg | DELAYED_RELEASE_TABLET | Freq: Every day | ORAL | Status: DC
  Administered 2018-03-24 – 2018-03-26 (×3): 10 mg via ORAL
  Filled 2018-03-23 (×4): qty 2

## 2018-03-23 MED ORDER — DEXAMETHASONE SODIUM PHOSPHATE 10 MG/ML IJ SOLN
INTRAMUSCULAR | Status: DC | PRN
  Administered 2018-03-23: 10 mg via INTRAVENOUS

## 2018-03-23 MED ORDER — OXYCODONE HCL 5 MG PO TABS
5.0000 mg | ORAL_TABLET | ORAL | Status: DC | PRN
  Administered 2018-03-24: 10 mg via ORAL
  Administered 2018-03-24: 5 mg via ORAL
  Administered 2018-03-24 – 2018-03-27 (×7): 10 mg via ORAL
  Filled 2018-03-23 (×5): qty 2
  Filled 2018-03-23: qty 1
  Filled 2018-03-23 (×3): qty 2
  Filled 2018-03-23 (×2): qty 1

## 2018-03-23 MED ORDER — CEFAZOLIN SODIUM-DEXTROSE 2-4 GM/100ML-% IV SOLN
2.0000 g | INTRAVENOUS | Status: AC
  Administered 2018-03-23: 2 g via INTRAVENOUS

## 2018-03-23 MED ORDER — ACETAMINOPHEN 500 MG PO TABS
1000.0000 mg | ORAL_TABLET | Freq: Four times a day (QID) | ORAL | Status: DC
  Administered 2018-03-23 – 2018-03-27 (×15): 1000 mg via ORAL
  Filled 2018-03-23 (×15): qty 2

## 2018-03-23 MED ORDER — ONDANSETRON HCL 4 MG/2ML IJ SOLN
INTRAMUSCULAR | Status: DC | PRN
  Administered 2018-03-23: 4 mg via INTRAVENOUS

## 2018-03-23 SURGICAL SUPPLY — 94 items
ADH SKN CLS APL DERMABOND .7 (GAUZE/BANDAGES/DRESSINGS) ×2
APPLIER CLIP ROT 10 11.4 M/L (STAPLE)
APR CLP MED LRG 11.4X10 (STAPLE)
BAG SPEC RTRVL LRG 6X4 10 (ENDOMECHANICALS)
CANISTER SUCT 3000ML PPV (MISCELLANEOUS) ×4 IMPLANT
CATH THORACIC 28FR (CATHETERS) IMPLANT
CATH THORACIC 28FR RT ANG (CATHETERS) IMPLANT
CATH THORACIC 36FR (CATHETERS) IMPLANT
CATH THORACIC 36FR RT ANG (CATHETERS) IMPLANT
CLIP APPLIE ROT 10 11.4 M/L (STAPLE) IMPLANT
CLIP VESOCCLUDE MED 6/CT (CLIP) ×4 IMPLANT
CONN ST 1/4X3/8  BEN (MISCELLANEOUS) ×2
CONN ST 1/4X3/8 BEN (MISCELLANEOUS) IMPLANT
CONN Y 3/8X3/8X3/8  BEN (MISCELLANEOUS)
CONN Y 3/8X3/8X3/8 BEN (MISCELLANEOUS) IMPLANT
CONT SPEC 4OZ CLIKSEAL STRL BL (MISCELLANEOUS) ×10 IMPLANT
COVER WAND RF STERILE (DRAPES) ×4 IMPLANT
DECANTER SPIKE VIAL GLASS SM (MISCELLANEOUS) ×2 IMPLANT
DERMABOND ADVANCED (GAUZE/BANDAGES/DRESSINGS) ×2
DERMABOND ADVANCED .7 DNX12 (GAUZE/BANDAGES/DRESSINGS) IMPLANT
DRAIN CHANNEL 28F RND 3/8 FF (WOUND CARE) ×2 IMPLANT
DRAIN CHANNEL 32F RND 10.7 FF (WOUND CARE) IMPLANT
DRAPE CV SPLIT W-CLR ANES SCRN (DRAPES) ×4 IMPLANT
DRAPE INCISE IOBAN 66X45 STRL (DRAPES) ×6 IMPLANT
DRAPE ORTHO SPLIT 77X108 STRL (DRAPES) ×4
DRAPE SURG ORHT 6 SPLT 77X108 (DRAPES) ×2 IMPLANT
DRAPE WARM FLUID 44X44 (DRAPE) ×4 IMPLANT
ELECT BLADE 6.5 EXT (BLADE) ×4 IMPLANT
ELECT REM PT RETURN 9FT ADLT (ELECTROSURGICAL) ×4
ELECTRODE REM PT RTRN 9FT ADLT (ELECTROSURGICAL) ×2 IMPLANT
FELT TEFLON 1X6 (MISCELLANEOUS) ×2 IMPLANT
FELT TEFLON 6X6 (MISCELLANEOUS) ×2 IMPLANT
GAUZE SPONGE 4X4 12PLY STRL (GAUZE/BANDAGES/DRESSINGS) ×4 IMPLANT
GLOVE BIOGEL PI IND STRL 6 (GLOVE) IMPLANT
GLOVE BIOGEL PI INDICATOR 6 (GLOVE) ×2
GLOVE SURG SIGNA 7.5 PF LTX (GLOVE) ×10 IMPLANT
GOWN STRL REUS W/ TWL LRG LVL3 (GOWN DISPOSABLE) ×4 IMPLANT
GOWN STRL REUS W/ TWL XL LVL3 (GOWN DISPOSABLE) ×2 IMPLANT
GOWN STRL REUS W/TWL LRG LVL3 (GOWN DISPOSABLE) ×8
GOWN STRL REUS W/TWL XL LVL3 (GOWN DISPOSABLE) ×4
HEMOSTAT SURGICEL 2X14 (HEMOSTASIS) IMPLANT
KIT BASIN OR (CUSTOM PROCEDURE TRAY) ×4 IMPLANT
KIT SUCTION CATH 14FR (SUCTIONS) ×4 IMPLANT
KIT TURNOVER KIT B (KITS) ×4 IMPLANT
NDL HYPO 25GX1X1/2 BEV (NEEDLE) IMPLANT
NDL SPNL 18GX3.5 QUINCKE PK (NEEDLE) IMPLANT
NEEDLE HYPO 25GX1X1/2 BEV (NEEDLE) IMPLANT
NEEDLE SPNL 18GX3.5 QUINCKE PK (NEEDLE) IMPLANT
NS IRRIG 1000ML POUR BTL (IV SOLUTION) ×12 IMPLANT
PACK CHEST (CUSTOM PROCEDURE TRAY) ×4 IMPLANT
PAD ARMBOARD 7.5X6 YLW CONV (MISCELLANEOUS) ×8 IMPLANT
POUCH ENDO CATCH II 15MM (MISCELLANEOUS) IMPLANT
POUCH SPECIMEN RETRIEVAL 10MM (ENDOMECHANICALS) IMPLANT
SCISSORS ENDO CVD 5DCS (MISCELLANEOUS) IMPLANT
SEALANT PROGEL (MISCELLANEOUS) IMPLANT
SEALANT SURG COSEAL 4ML (VASCULAR PRODUCTS) IMPLANT
SEALANT SURG COSEAL 8ML (VASCULAR PRODUCTS) IMPLANT
SHEARS HARMONIC HDI 20CM (ELECTROSURGICAL) IMPLANT
SOLUTION ANTI FOG 6CC (MISCELLANEOUS) ×4 IMPLANT
SPONGE INTESTINAL PEANUT (DISPOSABLE) IMPLANT
SPONGE TONSIL TAPE 1 RFD (DISPOSABLE) ×4 IMPLANT
SUT PROLENE 1 XLH (SUTURE) IMPLANT
SUT PROLENE 2 0 MH 48 (SUTURE) ×28 IMPLANT
SUT PROLENE 4 0 RB 1 (SUTURE) ×4
SUT PROLENE 4-0 RB1 .5 CRCL 36 (SUTURE) IMPLANT
SUT SILK  1 MH (SUTURE) ×4
SUT SILK 1 MH (SUTURE) ×4 IMPLANT
SUT SILK 1 TIES 10X30 (SUTURE) ×4 IMPLANT
SUT SILK 2 0 SH (SUTURE) IMPLANT
SUT SILK 2 0SH CR/8 30 (SUTURE) IMPLANT
SUT SILK 3 0 SH 30 (SUTURE) IMPLANT
SUT SILK 3 0SH CR/8 30 (SUTURE) ×4 IMPLANT
SUT VIC AB 1 CTX 36 (SUTURE) ×4
SUT VIC AB 1 CTX36XBRD ANBCTR (SUTURE) ×2 IMPLANT
SUT VIC AB 2-0 CTX 36 (SUTURE) ×4 IMPLANT
SUT VIC AB 2-0 UR6 27 (SUTURE) IMPLANT
SUT VIC AB 3-0 MH 27 (SUTURE) IMPLANT
SUT VIC AB 3-0 X1 27 (SUTURE) ×6 IMPLANT
SUT VICRYL 2 TP 1 (SUTURE) IMPLANT
SWAB CULTURE ESWAB REG 1ML (MISCELLANEOUS) IMPLANT
SYR 10ML LL (SYRINGE) IMPLANT
SYR 30ML LL (SYRINGE) IMPLANT
SYR 50ML LL SCALE MARK (SYRINGE) ×2 IMPLANT
SYSTEM SAHARA CHEST DRAIN ATS (WOUND CARE) ×4 IMPLANT
TAPE CLOTH 4X10 WHT NS (GAUZE/BANDAGES/DRESSINGS) ×4 IMPLANT
TAPE CLOTH SURG 4X10 WHT LF (GAUZE/BANDAGES/DRESSINGS) ×2 IMPLANT
TIP APPLICATOR SPRAY EXTEND 16 (VASCULAR PRODUCTS) IMPLANT
TOWEL GREEN STERILE (TOWEL DISPOSABLE) ×4 IMPLANT
TOWEL GREEN STERILE FF (TOWEL DISPOSABLE) ×4 IMPLANT
TRAP SPECIMEN MUCOUS 40CC (MISCELLANEOUS) IMPLANT
TRAY FOLEY MTR SLVR 16FR STAT (SET/KITS/TRAYS/PACK) ×4 IMPLANT
TROCAR XCEL BLADELESS 5X75MML (TROCAR) ×6 IMPLANT
TUNNELER SHEATH ON-Q 16GX12 DP (PAIN MANAGEMENT) IMPLANT
WATER STERILE IRR 1000ML POUR (IV SOLUTION) ×4 IMPLANT

## 2018-03-23 NOTE — Transfer of Care (Signed)
Immediate Anesthesia Transfer of Care Note  Patient: Samantha Greene  Procedure(s) Performed: VIDEO ASSISTED THORACOSCOPY (Right Chest) PLICATION OF DIAPHRAGM (N/A )  Patient Location: PACU  Anesthesia Type:General  Level of Consciousness: awake, drowsy and patient cooperative  Airway & Oxygen Therapy: Patient Spontanous Breathing and Patient connected to nasal cannula oxygen  Post-op Assessment: Report given to RN, Post -op Vital signs reviewed and stable and Patient moving all extremities X 4  Post vital signs: Reviewed and stable  Last Vitals:  Vitals Value Taken Time  BP 132/92 03/23/2018 11:19 AM  Temp    Pulse 82 03/23/2018 11:23 AM  Resp 13 03/23/2018 11:23 AM  SpO2 97 % 03/23/2018 11:23 AM  Vitals shown include unvalidated device data.  Last Pain:  Vitals:   03/23/18 0613  TempSrc:   PainSc: 0-No pain         Complications: No apparent anesthesia complications

## 2018-03-23 NOTE — Anesthesia Procedure Notes (Signed)
Arterial Line Insertion Start/End2/21/2020 7:55 AM, 03/23/2018 8:00 AM Performed by: Lewie Loron, MD  Patient location: Pre-op. Preanesthetic checklist: patient identified, IV checked, site marked, risks and benefits discussed, surgical consent, monitors and equipment checked, pre-op evaluation, timeout performed and anesthesia consent Lidocaine 1% used for infiltration Right, radial was placed Catheter size: 20 Fr Hand hygiene performed  and maximum sterile barriers used   Attempts: 3 (1st attempt by [employer], CRNA in preop, 2nd by CRNA in OR, 3rd by Jimesha Rising with U/S) Procedure performed using ultrasound guided technique. Ultrasound Notes:anatomy identified, needle tip was noted to be adjacent to the nerve/plexus identified, no ultrasound evidence of intravascular and/or intraneural injection and image(s) printed for medical record Following insertion, dressing applied and Biopatch. Post procedure assessment: normal and unchanged  Patient tolerated the procedure with difficulty.

## 2018-03-23 NOTE — Brief Op Note (Addendum)
03/23/2018  10:55 AM  PATIENT:  [name]  48 y.o. female  PRE-OPERATIVE DIAGNOSIS:  RIGHT DIAPHRAGMATIC HERNIA vs EVENTRATION  POST-OPERATIVE DIAGNOSIS:  RIGHT DIAPHRAGM EVENTRATION  PROCEDURE:  Procedure(s): VIDEO ASSISTED THORACOSCOPY (Right) PLICATION OF DIAPHRAGM (N/A)  SURGEON:  Surgeon(s) and Role:    * Loreli Slot, MD - Primary  PHYSICIAN ASSISTANT: Jillyn Hidden, PA  ANESTHESIA:   local and general  EBL:  100 mL   BLOOD ADMINISTERED:none  DRAINS: Right pleural 54fr Blake drain.   LOCAL MEDICATIONS USED:  BUPIVICAINE   SPECIMEN:  No Specimen  DISPOSITION OF SPECIMEN:  N/A  COUNTS:  YES  TOURNIQUET:  * No tourniquets in log *  DICTATION: .Dragon Dictation  PLAN OF CARE: Admit to inpatient   PATIENT DISPOSITION:  PACU - hemodynamically stable.   Delay start of Pharmacological VTE agent (>24hrs) due to surgical blood loss or risk of bleeding: no

## 2018-03-23 NOTE — Anesthesia Postprocedure Evaluation (Signed)
Anesthesia Post Note  Patient: Samantha Greene  Procedure(s) Performed: VIDEO ASSISTED THORACOSCOPY (Right Chest) PLICATION OF DIAPHRAGM (N/A )     Patient location during evaluation: PACU Anesthesia Type: General Level of consciousness: sedated and patient cooperative Pain management: pain level controlled Vital Signs Assessment: post-procedure vital signs reviewed and stable Respiratory status: spontaneous breathing Cardiovascular status: stable Anesthetic complications: no    Last Vitals:  Vitals:   03/23/18 1235 03/23/18 1250  BP: 120/84 121/83  Pulse: 82 82  Resp: 12 12  Temp:    SpO2: 96% 97%    Last Pain:  Vitals:   03/23/18 1305  TempSrc:   PainSc: Asleep                 Lewie Loron

## 2018-03-23 NOTE — Anesthesia Procedure Notes (Signed)
Procedure Name: Intubation Date/Time: 03/23/2018 7:41 AM Performed by: Lanell Matar, CRNA Pre-anesthesia Checklist: Patient identified, Emergency Drugs available, Suction available and Patient being monitored Patient Re-evaluated:Patient Re-evaluated prior to induction Oxygen Delivery Method: Circle System Utilized Preoxygenation: Pre-oxygenation with 100% oxygen Induction Type: IV induction Ventilation: Mask ventilation without difficulty Laryngoscope Size: Miller and 2 Grade View: Grade I Tube type: Oral Endobronchial tube: EBT position confirmed by auscultation and Double lumen EBT and 35 Fr Number of attempts: 1 Airway Equipment and Method: Stylet and Oral airway Placement Confirmation: ETT inserted through vocal cords under direct vision,  positive ETCO2 and breath sounds checked- equal and bilateral Tube secured with: Tape Dental Injury: Teeth and Oropharynx as per pre-operative assessment

## 2018-03-23 NOTE — Progress Notes (Signed)
Pt came to unit placed in room , alert and oriented , drowsy , made comfortable , care continues,

## 2018-03-23 NOTE — Op Note (Signed)
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  POSTOPERATIVE DIAGNOSIS:  Eventration of the anterior half of right hemidiaphragm.  PROCEDURE:  Right video-assisted thoracoscopy, plication of diaphragm and intercostal nerve blocks.  SURGEON:  Charlett Lango, MD  ASSISTANT:  Jillyn Hidden, PA-C.  ANESTHESIA:  General.  FINDINGS:  No hernia present. Anterior portion of diaphragm patulous, central tendon and posterior musculature normal appearance.  CLINICAL NOTE:  Mrs. Jass is a 48 year old woman who presents with a longstanding history of shortness of breath with exertion that has worsened recently.  She has had a known eventration of the right hemidiaphragm versus a diaphragmatic hernia.  CT of  the chest showed a majority of her liver was into her anterior right chest with marked displacement of the heart to the left side.  On a Sniff test, the posterior aspect of the right hemidiaphragm was functioning normally.  She was advised to undergo surgical  repair with either repair of a hernia versus plication of the diaphragm.  The indications, risks, benefits, and alternatives were discussed in detail with the patient.  She understood and accepted the risks and agreed to proceed.  OPERATIVE NOTE:  The patient was brought to the operating room on 03/23/2018.  She had induction of general anesthesia and was intubated with a double lumen endotracheal tube.  Intravenous antibiotics were administered.  Sequential compression devices  were placed on the calves for DVT prophylaxis.  A Foley catheter was placed.  She was placed in a left lateral decubitus position and the right chest was prepped and draped in the usual sterile  fashion.  Single lung ventilation of the left lung was  initiated and was tolerated well throughout the procedure.  A timeout was performed.  A solution containing 20 mL of liposomal bupivacaine, 30 mL of 0.5% bupivacaine and 50 mL of saline was prepared.  It was used for local anesthesia at the incision sites as well as the intercostal nerve blocks.  A site was  injected at the seventh interspace in the mid axillary line.  An incision was made and a 5 mm port was inserted.  The thoracoscope was advanced into the chest.  There was good isolation of the right lung.  Inspection revealed a clear demarcation between  the functional and nonfunctional halves of the diaphragm.  No hernia was present.  Intercostal nerve blocks and were performed from the 3rd to the 9th interspace from a posterior approach injecting the bupivacaine solution into a subpleural plane. 10 mL  was injected into each interspace.  An injection was made in the 4th interspace just anterior to the scapula.  A small port incision was made and the 5 mm port was moved to this position.  The scope was placed through this port to visualize the diaphragm.  The original  port incision was then extended, after again injecting with the bupivacaine solution.  The incision was approximately 7 cm in length.  No rib spreading was performed during the procedure.  The anterior half of the hemidiaphragm then was plicated using multiple 2-0  Prolene horizontal mattress sutures with Teflon felt pledgets.  Care was taken to avoid placing sutures in the vicinity of the phrenic nerve.  Following the plication, there was much more normal appearance of the hemidiaphragm.  There was good  hemostasis.  The chest was copiously irrigated with warm saline.  A small incision was made just inferior to the primary incision and a 28-French Blake drain was tunneled through this into the pleural space.  It was secured to skin with a #1 silk  suture.  Dual-lung ventilation  was resumed.  The port incision in the fourth interspace was closed with a 3-0 Vicryl subcuticular suture.  The working incision was closed in 3 layers.  The chest tube was placed to suction.  The patient was placed back in  supine position.  She was extubated in the operating room and taken to the Postanesthetic Care Unit in good condition.  All sponge, needle and instrument counts were correct at the end of the procedure.  TN/NUANCE  D:03/23/2018 T:03/23/2018 JOB:[id number]

## 2018-03-23 NOTE — Interval H&P Note (Signed)
History and Physical Interval Note:  03/23/2018 7:24 AM  Samantha Greene  has presented today for surgery, with the diagnosis of RIGHT DIAPHRAGMATIC HERNIA  The various methods of treatment have been discussed with the patient and family. After consideration of risks, benefits and other options for treatment, the patient has consented to  Procedure(s): VIDEO ASSISTED THORACOSCOPY (VATS)/WITH PROBABLE THOROCOTOMY (Right) REPAIR OF DIAPHRAGMATIC HERNIA (N/A) as a surgical intervention .  The patient's history has been reviewed, patient examined, no change in status, stable for surgery.  I have reviewed the patient's chart and labs.  Questions were answered to the patient's satisfaction.     Loreli Slot

## 2018-03-24 ENCOUNTER — Other Ambulatory Visit: Payer: Self-pay | Admitting: Obstetrics & Gynecology

## 2018-03-24 ENCOUNTER — Encounter (HOSPITAL_COMMUNITY): Payer: Self-pay | Admitting: Thoracic Surgery (Cardiothoracic Vascular Surgery)

## 2018-03-24 LAB — CBC
HCT: 39.8 % (ref 36.0–46.0)
Hemoglobin: 12.6 g/dL (ref 12.0–15.0)
MCH: 31.3 pg (ref 26.0–34.0)
MCHC: 31.7 g/dL (ref 30.0–36.0)
MCV: 98.8 fL (ref 80.0–100.0)
Platelets: 324 10*3/uL (ref 150–400)
RBC: 4.03 MIL/uL (ref 3.87–5.11)
RDW: 12.4 % (ref 11.5–15.5)
WBC: 13 10*3/uL — AB (ref 4.0–10.5)
nRBC: 0 % (ref 0.0–0.2)

## 2018-03-24 LAB — BASIC METABOLIC PANEL
ANION GAP: 13 (ref 5–15)
BUN: 9 mg/dL (ref 6–20)
CO2: 21 mmol/L — ABNORMAL LOW (ref 22–32)
Calcium: 9 mg/dL (ref 8.9–10.3)
Chloride: 101 mmol/L (ref 98–111)
Creatinine, Ser: 0.91 mg/dL (ref 0.44–1.00)
GFR calc Af Amer: >60 mL/min (ref >60)
GFR calc non Af Amer: >60 mL/min (ref >60)
Glucose, Bld: 113 mg/dL — ABNORMAL HIGH (ref 70–99)
Potassium: 4.4 mmol/L (ref 3.5–5.1)
Sodium: 135 mmol/L (ref 135–145)

## 2018-03-24 LAB — BLOOD GAS, ARTERIAL
Acid-Base Excess: 0.6 mmol/L (ref 0.0–2.0)
Bicarbonate: 25.4 mmol/L (ref 20.0–28.0)
O2 Content: 2 L/min
O2 Saturation: 96.7 %
Patient temperature: 98.5
pCO2 arterial: 45.9 mmHg (ref 32.0–48.0)
pH, Arterial: 7.361 (ref 7.350–7.450)
pO2, Arterial: 96.1 mmHg (ref 83.0–108.0)

## 2018-03-24 MED ORDER — ALBUTEROL SULFATE (2.5 MG/3ML) 0.083% IN NEBU
2.5000 mg | INHALATION_SOLUTION | RESPIRATORY_TRACT | Status: DC | PRN
  Administered 2018-03-25: 2.5 mg via RESPIRATORY_TRACT
  Filled 2018-03-24: qty 3

## 2018-03-24 MED ORDER — KETOROLAC TROMETHAMINE 15 MG/ML IJ SOLN
15.0000 mg | Freq: Four times a day (QID) | INTRAMUSCULAR | Status: DC
  Administered 2018-03-24 – 2018-03-25 (×5): 15 mg via INTRAVENOUS
  Filled 2018-03-24 (×5): qty 1

## 2018-03-24 NOTE — Progress Notes (Signed)
Co ox drawn from arterial line by RN and sent to respiratory. AM Labs drawn and sent to lab via this nurse

## 2018-03-24 NOTE — Progress Notes (Signed)
Removed A Line per provider orders. Patient tolerated removal well. Catheter intact. Held pressure for 7 minutes. Covered area with gauze. Dressing is clean dry and intact. I will continue to monitor area .

## 2018-03-24 NOTE — Progress Notes (Signed)
Patient c/o sharp mid thoracic cavity pain. EKG done without significant findings. After repositioning patient and administering muscle relaxant along with prn oxy, patient stated relief. VSS. Will continue to monitor.

## 2018-03-24 NOTE — Progress Notes (Signed)
Dilaudid 3 mls discarded , witnessed by [employer].

## 2018-03-24 NOTE — Progress Notes (Signed)
1 Day Post-Op Procedure(s) (LRB): VIDEO ASSISTED THORACOSCOPY (Right) PLICATION OF DIAPHRAGM (N/A) Subjective: C/o incisional pain, not using PCA much  Objective: Vital signs in last 24 hours: Temp:  [97 F (36.1 C)-98.8 F (37.1 C)] 98.8 F (37.1 C) (02/22 0912) Pulse Rate:  [77-96] 83 (02/22 0800) Cardiac Rhythm: Normal sinus rhythm (02/22 0912) Resp:  [8-24] 15 (02/22 0904) BP: (102-150)/(75-122) 137/93 (02/22 0912) SpO2:  [92 %-100 %] 97 % (02/22 0904) Arterial Line BP: (95-162)/(61-94) 144/77 (02/22 0800)  Hemodynamic parameters for last 24 hours:    Intake/Output from previous day: 02/21 0701 - 02/22 0700 In: 2720 [P.O.:720; I.V.:1900; IV Piggyback:100] Out: 2175 [Urine:1835; Blood:100; Chest Tube:240] Intake/Output this shift: No intake/output data recorded.  General appearance: alert and cooperative Neurologic: intact Heart: regular rate and rhythm Lungs: diminished breath sounds bibasilar Abdomen: normal findings: soft, non-tender no air leak, sreosanguinous drainage from CT  Lab Results: Recent Labs    03/21/18 0936 03/24/18 0457  WBC 5.4 13.0*  HGB 13.6 12.6  HCT 43.1 39.8  PLT 285 324   BMET:  Recent Labs    03/21/18 0936 03/24/18 0457  NA 136 135  K 4.1 4.4  CL 104 101  CO2 20* 21*  GLUCOSE 93 113*  BUN 11 9  CREATININE 0.93 0.91  CALCIUM 9.3 9.0    PT/INR:  Recent Labs    03/21/18 0936  LABPROT 13.1  INR 1.00   ABG    Component Value Date/Time   PHART 7.361 03/24/2018 0450   HCO3 25.4 03/24/2018 0450   ACIDBASEDEF 0.6 03/21/2018 0935   O2SAT 96.7 03/24/2018 0450   CBG (last 3)  No results for input(s): GLUCAP in the last 72 hours.  Assessment/Plan: S/P Procedure(s) (LRB): VIDEO ASSISTED THORACOSCOPY (Right) PLICATION OF DIAPHRAGM (N/A) - overall doing well  CT to water seal- dc when < 200 ml/day Mobilize PCA for pain control, will add toradol for 48 hours IS SCD + enoxaparin for DVT prophylaxis Advance diet Dc A  line   LOS: 1 day    Loreli Slot 03/24/2018

## 2018-03-25 ENCOUNTER — Inpatient Hospital Stay (HOSPITAL_COMMUNITY): Payer: BLUE CROSS/BLUE SHIELD

## 2018-03-25 LAB — COMPREHENSIVE METABOLIC PANEL
ALBUMIN: 3.4 g/dL — AB (ref 3.5–5.0)
ALT: 37 U/L (ref 0–44)
AST: 44 U/L — ABNORMAL HIGH (ref 15–41)
Alkaline Phosphatase: 58 U/L (ref 38–126)
Anion gap: 10 (ref 5–15)
BUN: 17 mg/dL (ref 6–20)
CO2: 28 mmol/L (ref 22–32)
Calcium: 9.3 mg/dL (ref 8.9–10.3)
Chloride: 98 mmol/L (ref 98–111)
Creatinine, Ser: 2.37 mg/dL — ABNORMAL HIGH (ref 0.44–1.00)
GFR calc Af Amer: 27 mL/min — ABNORMAL LOW (ref >60)
GFR calc non Af Amer: 23 mL/min — ABNORMAL LOW (ref >60)
Glucose, Bld: 116 mg/dL — ABNORMAL HIGH (ref 70–99)
POTASSIUM: 4 mmol/L (ref 3.5–5.1)
Sodium: 136 mmol/L (ref 135–145)
Total Bilirubin: 0.6 mg/dL (ref 0.3–1.2)
Total Protein: 6.4 g/dL — ABNORMAL LOW (ref 6.5–8.1)

## 2018-03-25 LAB — CBC
HCT: 40.8 % (ref 36.0–46.0)
Hemoglobin: 12.9 g/dL (ref 12.0–15.0)
MCH: 31.9 pg (ref 26.0–34.0)
MCHC: 31.6 g/dL (ref 30.0–36.0)
MCV: 100.7 fL — ABNORMAL HIGH (ref 80.0–100.0)
NRBC: 0 % (ref 0.0–0.2)
Platelets: 273 10*3/uL (ref 150–400)
RBC: 4.05 MIL/uL (ref 3.87–5.11)
RDW: 12.8 % (ref 11.5–15.5)
WBC: 10.8 10*3/uL — AB (ref 4.0–10.5)

## 2018-03-25 MED ORDER — AMLODIPINE BESYLATE 5 MG PO TABS: 5.0000 mg | ORAL_TABLET | Freq: Every day | ORAL | Status: DC

## 2018-03-25 MED ORDER — FAMOTIDINE 20 MG PO TABS
20.0000 mg | ORAL_TABLET | Freq: Every day | ORAL | Status: DC
  Administered 2018-03-25 – 2018-03-27 (×3): 20 mg via ORAL
  Filled 2018-03-25 (×3): qty 1

## 2018-03-25 NOTE — Progress Notes (Addendum)
      [address]       [place] [address]             [phone]      2 Days Post-Op Procedure(s) (LRB): VIDEO ASSISTED THORACOSCOPY (Right) PLICATION OF DIAPHRAGM (N/A) Subjective: Feels okay, sleepy  Objective: Vital signs in last 24 hours: Temp:  [97.6 F (36.4 C)-98.8 F (37.1 C)] 97.6 F (36.4 C) (02/23 1136) Pulse Rate:  [82-92] 91 (02/23 1136) Cardiac Rhythm: Normal sinus rhythm (02/23 0700) Resp:  [12-22] 17 (02/23 1200) BP: (91-118)/(65-79) 108/66 (02/23 1136) SpO2:  [90 %-98 %] 93 % (02/23 1200)     Intake/Output from previous day: 02/22 0701 - 02/23 0700 In: -  Out: 630 [Urine:600; Chest Tube:30] Intake/Output this shift: No intake/output data recorded.  General appearance: alert, cooperative and no distress Heart: regular rate and rhythm, S1, S2 normal, no murmur, click, rub or gallop Lungs: clear to auscultation bilaterally Abdomen: soft, non-tender; bowel sounds normal; no masses,  no organomegaly Extremities: extremities normal, atraumatic, no cyanosis or edema Wound: clean and dry  Lab Results: Recent Labs    03/24/18 0457 03/25/18 0221  WBC 13.0* 10.8*  HGB 12.6 12.9  HCT 39.8 40.8  PLT 324 273   BMET:  Recent Labs    03/24/18 0457 03/25/18 0221  NA 135 136  K 4.4 4.0  CL 101 98  CO2 21* 28  GLUCOSE 113* 116*  BUN 9 17  CREATININE 0.91 2.37*  CALCIUM 9.0 9.3    PT/INR: No results for input(s): LABPROT, INR in the last 72 hours. ABG    Component Value Date/Time   PHART 7.361 03/24/2018 0450   HCO3 25.4 03/24/2018 0450   ACIDBASEDEF 0.6 03/21/2018 0935   O2SAT 96.7 03/24/2018 0450   CBG (last 3)  No results for input(s): GLUCAP in the last 72 hours.  Assessment/Plan: S/P Procedure(s) (LRB): VIDEO ASSISTED THORACOSCOPY (Right) PLICATION OF DIAPHRAGM (N/A)  1. CV-HR in the 90s, NSR. BP well controlled.  2. Pulm-tolerating room air with good oxygen saturation. CXR stable and no air leak. Only 30cc/24  hours out of chest tube. Will discontinue 3. Renal-creatinine increased from 0.91 to 2.37. Will discontinue toradol and ARB. Added Norvasc tomorrow for HTN 4. H and H stable 5. Continue PCA until tomorrow   Plan: Avoid nephrotoxic drugs and ordered another BMET for the morning. Discontinue Toradol and ARB. Discontinue chest tube today. CXR in the morning.    LOS: 2 days    Sharlene Dory 03/25/2018   Chart reviewed, patient examined, agree with above. CXR with tiny apical ptx. Chest tube removed today. Will repeat CXR in am. Creat jumped from 0.9 to 2.37. Stopped Toradol and ARB. Repeat BMET in am. She is taking po ok.

## 2018-03-26 ENCOUNTER — Inpatient Hospital Stay (HOSPITAL_COMMUNITY): Payer: BLUE CROSS/BLUE SHIELD

## 2018-03-26 LAB — BASIC METABOLIC PANEL
ANION GAP: 6 (ref 5–15)
BUN: 16 mg/dL (ref 6–20)
CO2: 27 mmol/L (ref 22–32)
Calcium: 8.7 mg/dL — ABNORMAL LOW (ref 8.9–10.3)
Chloride: 100 mmol/L (ref 98–111)
Creatinine, Ser: 1.22 mg/dL — ABNORMAL HIGH (ref 0.44–1.00)
GFR calc Af Amer: >60 mL/min (ref >60)
GFR calc non Af Amer: 52 mL/min — ABNORMAL LOW (ref >60)
Glucose, Bld: 123 mg/dL — ABNORMAL HIGH (ref 70–99)
Potassium: 4.1 mmol/L (ref 3.5–5.1)
Sodium: 133 mmol/L — ABNORMAL LOW (ref 135–145)

## 2018-03-26 MED ORDER — IBUPROFEN 200 MG PO TABS: 800.0000 mg | ORAL_TABLET | Freq: Three times a day (TID) | ORAL | Status: DC | PRN

## 2018-03-26 MED ORDER — MELOXICAM 7.5 MG PO TABS
15.0000 mg | ORAL_TABLET | Freq: Every day | ORAL | Status: DC
  Administered 2018-03-26 – 2018-03-27 (×2): 15 mg via ORAL
  Filled 2018-03-26 (×2): qty 2

## 2018-03-26 MED ORDER — MAGIC MOUTHWASH
10.0000 mL | Freq: Three times a day (TID) | ORAL | Status: AC
  Administered 2018-03-26 (×2): 10 mL via ORAL
  Filled 2018-03-26 (×4): qty 10

## 2018-03-26 MED ORDER — LACTULOSE 10 GM/15ML PO SOLN: 20.0000 g | Freq: Every day | ORAL | Status: DC | PRN

## 2018-03-26 MED ORDER — PANTOPRAZOLE SODIUM 40 MG PO TBEC
40.0000 mg | DELAYED_RELEASE_TABLET | Freq: Every day | ORAL | Status: DC
  Administered 2018-03-26 – 2018-03-27 (×2): 40 mg via ORAL
  Filled 2018-03-26 (×2): qty 1

## 2018-03-26 NOTE — Progress Notes (Signed)
      [address]       [name] [address]             [phone]      3 Days Post-Op Procedure(s) (LRB): VIDEO ASSISTED THORACOSCOPY (Right) PLICATION OF DIAPHRAGM (N/A)   Subjective:  Patient continues to have pain.  She continues to have difficulty with swallowing food, stating it gets stuck.  + ambulation  No BM  Objective: Vital signs in last 24 hours: Temp:  [97.3 F (36.3 C)-98.7 F (37.1 C)] 98.5 F (36.9 C) (02/24 0716) Pulse Rate:  [81-97] 81 (02/24 0716) Cardiac Rhythm: Normal sinus rhythm (02/23 2000) Resp:  [11-22] 16 (02/24 0716) BP: (93-120)/(58-77) 97/58 (02/24 0716) SpO2:  [90 %-99 %] 94 % (02/24 0716)  Intake/Output from previous day: 02/23 0701 - 02/24 0700 In: 600 [P.O.:480; I.V.:120] Out: 130 [Chest Tube:130]  General appearance: alert, cooperative and no distress Heart: regular rate and rhythm Lungs: clear to auscultation bilaterally Abdomen: soft, non-tender; bowel sounds normal; no masses,  no organomegaly Extremities: extremities normal, atraumatic, no cyanosis or edema Wound: clean and dry  Lab Results: Recent Labs    03/24/18 0457 03/25/18 0221  WBC 13.0* 10.8*  HGB 12.6 12.9  HCT 39.8 40.8  PLT 324 273   BMET:  Recent Labs    03/25/18 0221 03/26/18 0222  NA 136 133*  K 4.0 4.1  CL 98 100  CO2 28 27  GLUCOSE 116* 123*  BUN 17 16  CREATININE 2.37* 1.22*  CALCIUM 9.3 8.7*    PT/INR: No results for input(s): LABPROT, INR in the last 72 hours. ABG    Component Value Date/Time   PHART 7.361 03/24/2018 0450   HCO3 25.4 03/24/2018 0450   ACIDBASEDEF 0.6 03/21/2018 0935   O2SAT 96.7 03/24/2018 0450   CBG (last 3)  No results for input(s): GLUCAP in the last 72 hours.  Assessment/Plan: S/P Procedure(s) (LRB): VIDEO ASSISTED THORACOSCOPY (Right) PLICATION OF DIAPHRAGM (N/A)  1. CV- NSR, BP is in the 90s to low 100s- will d/c Norvasc 2. Pulm- off oxygen, continue IS, CXR without pneumothorax,  continued atelectasis 3. Renal- creatinine improved down to 1.22, all nephrotoxic agents have been stopped 4. Dysphagia- difficulty with oral intake, feels like food is getting stuck, will add Protonix, throat looks okay, continue to increase oral intake as tolerated 5. Pain control- d/c PCA, continue Oxy for pain control, added Ibuprofen prn, restarted home Mobic 6. Dispo- patient stable, creatinine has improved down to 1.22 likely elevated previously due to Toradol use, stop Norvasc as BP is normal, increase oral intake as able, patient is stable, will see how patient does without PCA today, watch dysphagia and oral intake, continue ambulation, hopefully home in next 24-48 hours as pain control and oral intake improve   LOS: 3 days    Lowella Dandy 03/26/2018

## 2018-03-26 NOTE — Progress Notes (Signed)
Placed order for a full liquid diet d/t patient had c/o not being able to tolerate regular foods. Says that she "feels like my food dont go down well." Says that she tolerates liquids well. Takes po meds without issues. Will continue to advance diet as tolerated.

## 2018-03-26 NOTE — Progress Notes (Signed)
PCA pump has been DC'd per provider orders. Wasted 13cc Dilauded with 2nd Nurse, Coralyn Helling, verification in the Steri Box in the medication room.

## 2018-03-27 ENCOUNTER — Inpatient Hospital Stay (HOSPITAL_COMMUNITY): Payer: BLUE CROSS/BLUE SHIELD

## 2018-03-27 MED ORDER — IBUPROFEN 800 MG PO TABS: 800.0000 mg | ORAL_TABLET | Freq: Three times a day (TID) | ORAL | 0 refills | Status: DC | PRN

## 2018-03-27 MED ORDER — OXYCODONE HCL 10 MG PO TABS: 10.0000 mg | ORAL_TABLET | ORAL | 0 refills | Status: DC | PRN

## 2018-03-27 MED ORDER — ACETAMINOPHEN 500 MG PO TABS: 1000.0000 mg | ORAL_TABLET | Freq: Four times a day (QID) | ORAL | 0 refills | Status: DC | PRN

## 2018-03-27 MED ORDER — PNEUMOCOCCAL VAC POLYVALENT 25 MCG/0.5ML IJ INJ: 0.5000 mL | INJECTION | INTRAMUSCULAR | Status: DC

## 2018-03-27 NOTE — Discharge Summary (Addendum)
Physician Discharge Summary  Patient ID: Samantha Greene MRN: [id number] DOB/AGE: [birth year] 48 y.o.  Admit date: 03/23/2018 Discharge date: 03/27/2018  Admission Diagnoses: Right hemidiaphragm eventration vs. hernia  Patient Active Problem List   Diagnosis Date Noted  . Genetic testing 03/19/2018  . Family history of breast cancer   . Family history of colon cancer   . Family history of prostate cancer   . Family history of lung cancer   . Family history of pancreatic cancer   . Carpal tunnel syndrome on both sides 06/29/2017  . Morbid obesity (HCC) 06/28/2017  . Psoriasis, guttate 02/22/2017  . Vitamin D deficiency 10/24/2016  . Dyspnea 10/01/2014  . Depression   . Hyperlipidemia   . Hypertension   . Anxiety   . Insomnia 03/02/2011   Discharge Diagnoses: Right hemidiaphragm eventration  Patient Active Problem List   Diagnosis Date Noted  . S/P Thoracotomy with Plication of Diaphragm 03/23/2018  . Genetic testing 03/19/2018  . Family history of breast cancer   . Family history of colon cancer   . Family history of prostate cancer   . Family history of lung cancer   . Family history of pancreatic cancer   . Carpal tunnel syndrome on both sides 06/29/2017  . Morbid obesity (HCC) 06/28/2017  . Psoriasis, guttate 02/22/2017  . Vitamin D deficiency 10/24/2016  . Dyspnea 10/01/2014  . Depression   . Hyperlipidemia   . Hypertension   . Anxiety   . Insomnia 03/02/2011   Discharged Condition: good  History of Present Illness:  Samantha Greene is a 48 yo female with PMH of obesity, HTN, Hyperlipidemia, anxiety, and depression.  The patient has been experiencing shortness of breath with exertion for the past several years.  These symptoms have worsened recently.  She is also experiencing pain along the right costal margin and some left sided chest pain.  She has had issues with respiratory infections in the past and on a recent CXR she was told her liver was pushing up  into her diaphragm.  CT of the chest was obtained in October and showed massive herniation of the liver into the her anterior right chest, with displacement of the heart of left side.  She was evaluated by Samantha Greene who performed a Samantha Greene test which showed the posterior aspect of the right hemidiaphragm does move.  The patient was referred to Samantha Greene for surgical evaluation.  She was evaluated by Samantha Greene on 03/13/2018.  During this visit she denied any history of abdominal trauma.  It was felt she should undergo VATS with repair of diaphragmatic hernia.  The risks and benefits of the procedure were explained to the patient and she was agreeable to proceed.  Hospital Course:   Samantha Greene presented to Samantha Greene on 03/23/2018.  She was taken to the operating room and underwent Right VATS with plication of diaphragm and intercostal nerve blocks.  She tolerated the procedure without difficulty, was extubated and taken to the recovery room in stable condition.  Her post operative course was complicated due to dysphagia.  She was able to tolerate a liquid diet, but mainly states she just didn't feel that hungry.  Her chest tube was free from air leak, transitioned to water seal and removed on 03/25/2018.  She developed an acute elevation in her creatinine to 2.37.  This was related to Toradol use.  Repeat level was obtained and creatinine had returned to normal limits.  She was ambulating without much difficulty.  Her pain level was controlled. Her incision was healing without evidence of infection.  She is medically stable for discharge home today.  Significant Diagnostic Studies: radiology:   CT scan:  1. Significant elevation of the right hemidiaphragm as a result of focal eventration anteriorly with much of the liver extending cephalad compressing the right lung base and displacing the heart to the left of midline. However no mass or adenopathy is seen. 2. No lung infiltrate or suspicious  nodule is seen. No pleural effusion is noted. 3. Peripherally calcified gallstone within the decompressed gallbladder measuring 2.2 cm. 4. Bilateral renal calculi the largest of 5 mm in diameter on the right. No hydronephrosis.  Treatments: surgery:   Right video-assisted thoracoscopy, plication of diaphragm and intercostal nerve blocks.  Discharge Exam: Blood pressure 119/73, pulse 86, temperature 97.7 F (36.5 C), temperature source Axillary, resp. rate 12, height 5' 0.5" (1.537 m), weight 87.1 kg, last menstrual period 03/14/2018, SpO2 98 %.  General appearance: alert, cooperative and no distress Heart: regular rate and rhythm Lungs: clear to auscultation bilaterally Abdomen: soft, non-tender; bowel sounds normal; no masses,  no organomegaly Extremities: extremities normal, atraumatic, no cyanosis or edema Wound: clean and dry, some ecchymosis  Disposition: Home  Discharge Medications:   Allergies as of 03/27/2018      Reactions   Erythromycin Nausea Only, Other (See Comments)   Stomach cramping (can take Z Pak)   Flexeril [cyclobenzaprine Hcl] Other (See Comments)   "don't like the way I feel"   Metformin And Related Other (See Comments)   GI Upset      Medication List    STOP taking these medications   telmisartan 40 MG tablet Commonly known as:  MICARDIS     TAKE these medications   acetaminophen 500 MG tablet Commonly known as:  TYLENOL Take 2 tablets (1,000 mg total) by mouth every 6 (six) hours as needed.   albuterol 108 (90 Base) MCG/ACT inhaler Commonly known as:  VENTOLIN HFA Inhale 2 puffs into the lungs every 4 (four) hours as needed for wheezing or shortness of breath.   aspirin 81 MG tablet Take 81 mg by mouth daily.   baclofen 10 MG tablet Commonly known as:  LIORESAL TAKE 1/2 TO 1 TABLET BY MOUTH 2X DAY IF NEEDED FOR MUSCLE SPASM What changed:    how much to take  how to take this  when to take this  reasons to take  this  additional instructions   buPROPion 300 MG 24 hr tablet Commonly known as:  WELLBUTRIN XL TAKE 1 TABLET BY MOUTH EVERY DAY IN THE MORNING What changed:  See the new instructions.   cetirizine 10 MG tablet Commonly known as:  ZYRTEC Take 10 mg by mouth at bedtime.   escitalopram 20 MG tablet Commonly known as:  LEXAPRO TAKE 1 TABLET BY MOUTH EVERY DAY   Eszopiclone 3 MG Tabs Take 1 tablet (3 mg total) by mouth at bedtime as needed. Take immediately before bedtime What changed:  when to take this   fluticasone 50 MCG/ACT nasal spray Commonly known as:  FLONASE Place 2 sprays into both nostrils daily.   ibuprofen 800 MG tablet Commonly known as:  ADVIL,MOTRIN Take 1 tablet (800 mg total) by mouth every 8 (eight) hours as needed (breakthrough pain).   meloxicam 15 MG tablet Commonly known as:  MOBIC Take 1 tablet (15 mg total) by mouth daily.   montelukast 10 MG tablet Commonly known as:  SINGULAIR Take 1 tablet  daily for Allergies What changed:    how much to take  how to take this  when to take this   nystatin cream Commonly known as:  MYCOSTATIN Apply 1 application topically 2 (two) times daily. What changed:    when to take this  reasons to take this   omeprazole 40 MG capsule Commonly known as:  PRILOSEC TAKE 1 CAPSULE BY MOUTH DAILY   Oxycodone HCl 10 MG Tabs Take 1 tablet (10 mg total) by mouth every 4 (four) hours as needed for severe pain.   pravastatin 40 MG tablet Commonly known as:  PRAVACHOL TAKE 1 TABLET BY MOUTH EVERY DAY   triamcinolone ointment 0.1 % Commonly known as:  KENALOG Apply 1 application topically 2 (two) times daily. What changed:    when to take this  reasons to take this   Vitamin D 125 MCG (5000 UT) Caps Take 5,000 Units by mouth daily.   vitamin E 600 UNIT capsule Take 600 Units by mouth daily.      Follow-up Information    Loreli Slot, MD Follow up on 04/17/2018.   Specialty:  Cardiothoracic  Surgery Why:  Appointment is at 4:00, please get CXR at 3:30 at [facility] located on first floor of our office building. Contact information: [address] [place] [address] [phone]        [facility] Follow up on 04/03/2018.   Specialty:  Cardiothoracic Surgery Why:  Appointment is at 10:00 with nurse for suture removal Contact information: [address] [place], [address] [place] [address] [phone]          Signed:  Lowella Dandy PA-C  03/27/2018, 9:17 AM

## 2018-03-27 NOTE — Progress Notes (Addendum)
      [address]       [place] [address]             [phone]      4 Days Post-Op Procedure(s) (LRB): VIDEO ASSISTED THORACOSCOPY (Right) PLICATION OF DIAPHRAGM (N/A)   Subjective:  No new complaints.  Continues to have some pain, gets relief with medications.  + cough with sputum production, still not eating a whole lot.  Denies N/V   Objective: Vital signs in last 24 hours: Temp:  [97.6 F (36.4 C)-99.1 F (37.3 C)] 97.7 F (36.5 C) (02/25 0742) Pulse Rate:  [80-88] 86 (02/25 0742) Cardiac Rhythm: Normal sinus rhythm (02/25 0742) Resp:  [12-113] 12 (02/25 0423) BP: (109-128)/(67-87) 119/73 (02/25 0742) SpO2:  [95 %-99 %] 98 % (02/25 0742)  Intake/Output from previous day: 02/24 0701 - 02/25 0700 In: 220 [P.O.:220] Out: -   General appearance: alert, cooperative and no distress Heart: regular rate and rhythm Lungs: clear to auscultation bilaterally Abdomen: soft, non-tender; bowel sounds normal; no masses,  no organomegaly Extremities: extremities normal, atraumatic, no cyanosis or edema Wound: clean and dry, some ecchymosis  Lab Results: Recent Labs    03/25/18 0221  WBC 10.8*  HGB 12.9  HCT 40.8  PLT 273   BMET:  Recent Labs    03/25/18 0221 03/26/18 0222  NA 136 133*  K 4.0 4.1  CL 98 100  CO2 28 27  GLUCOSE 116* 123*  BUN 17 16  CREATININE 2.37* 1.22*  CALCIUM 9.3 8.7*    PT/INR: No results for input(s): LABPROT, INR in the last 72 hours. ABG    Component Value Date/Time   PHART 7.361 03/24/2018 0450   HCO3 25.4 03/24/2018 0450   ACIDBASEDEF 0.6 03/21/2018 0935   O2SAT 96.7 03/24/2018 0450   CBG (last 3)  No results for input(s): GLUCAP in the last 72 hours.  Assessment/Plan: S/P Procedure(s) (LRB): VIDEO ASSISTED THORACOSCOPY (Right) PLICATION OF DIAPHRAGM (N/A)  1. CV- NSR, BP remains controlled, will have her follow up with PCP for further BP montioring 2. Pulm- off oxygen, continue IS at discharge 3.  Renal- creatinine improved, no edema on exam 4. Dysphagia- patient mainly states not all that hungry, she is tolerating a liquid diet, states she has similar issues at home, encouraged to eat what she can tolerate 5. Dispo- patient is stable, she is ambulating, tolerating oral intake, pain is controlled, will d/c home today   LOS: 4 days    Lowella Dandy 03/27/2018 Patient seen and examined, agree with above She is ready to go home  [place] C. Dorris Fetch, MD [facility] [phone]

## 2018-03-27 NOTE — Discharge Instructions (Signed)
Discharge Instructions:  1. You may shower, please wash incisions daily with soap and water and keep dry.  If you wish to cover wounds with dressing you may do so but please keep clean and change daily.  No tub baths or swimming until incisions have completely healed.  If your incisions become red or develop any drainage please call our office at [phone]  2. No Driving until cleared by Dr. Hendrickson's office and you are no longer using narcotic pain medications  3. Fever of 101.5 for at least 24 hours with no source, please contact our office at [phone]  4. Activity- up as tolerated, please walk at least 3 times per day.  Avoid strenuous activity, no lifting, pushing, or pulling with your arms over 8-10 lbs for a minimum of 6 weeks  5. If any questions or concerns arise, please do not hesitate to contact our office at [phone]  

## 2018-03-28 ENCOUNTER — Ambulatory Visit: Payer: BLUE CROSS/BLUE SHIELD

## 2018-03-28 ENCOUNTER — Encounter: Payer: Self-pay | Admitting: *Deleted

## 2018-03-28 NOTE — Progress Notes (Signed)
Samantha Greene called today with c/o expectorating dark green mucous when coughing that had the consistency of "creamy mashed potatoes".  She says it feels lodged in her throat with difficulty getting all of it out.  Her voice is rough/gravely.  She was discharged yesterday after R VATS/PLICATION OF THE DIAPHRAGM.  I discussed this with Dr. Dorris Fetch  He advised using Mucinex which I relayed to her.  She agreed.

## 2018-03-29 MED ORDER — FLUCONAZOLE 150 MG PO TABS: 150.0000 mg | ORAL_TABLET | Freq: Every day | ORAL | 3 refills | Status: DC

## 2018-04-03 ENCOUNTER — Ambulatory Visit (INDEPENDENT_AMBULATORY_CARE_PROVIDER_SITE_OTHER): Payer: Self-pay

## 2018-04-03 ENCOUNTER — Other Ambulatory Visit: Payer: Self-pay

## 2018-04-03 VITALS — Temp 98.0°F

## 2018-04-03 DIAGNOSIS — Z4802 Encounter for removal of sutures: Secondary | ICD-10-CM

## 2018-04-03 NOTE — Progress Notes (Signed)
Patient arrived for nurse visit to remove suture/staples post- procedure VATS/ Plication of Diaphragm with Dr. Dorris Fetch 03/23/2018. One Suture removed from right mid-abdomen with no signs/ symptoms of infection noted.  Patient tolerated procedure well.  Patient/ family instructed to keep the incision sites clean and dry.  Patient/ family acknowledged instructions given.  Patient did have questions about her lower extremities.  She did appear to have 2+ dependant edema, however she has not been keeping her legs elevated.  I advised to keep her legs elevated when seated, and elevate them in the bed.  Also, I advised to wear compression stockings to help, she acknowledged receipt.  She asked about coughing up some greenish mucous.  She stated it was very little but she felt like she was getting sick.  She stated that she has not been running a temperature. Temperature was 98 when checked.  She states that she has used the incentive spirometer, but cannot get it above 1000.  I advised her to use the incentive spirometer more and make sure she is coughing and deep breathing.  She acknowledged receipt.  She is aware of her follow-up appointment and was advised to give [place] a call if she starts to have any problems with her incisions or if the swelling in her legs do not subside.

## 2018-04-04 ENCOUNTER — Other Ambulatory Visit: Payer: Self-pay | Admitting: *Deleted

## 2018-04-06 ENCOUNTER — Telehealth: Payer: Self-pay

## 2018-04-06 ENCOUNTER — Other Ambulatory Visit: Payer: Self-pay

## 2018-04-06 MED ORDER — TRAMADOL HCL 50 MG PO TABS: 50.0000 mg | ORAL_TABLET | Freq: Four times a day (QID) | ORAL | 0 refills | Status: DC | PRN

## 2018-04-06 NOTE — Telephone Encounter (Signed)
Left a voicemail message regarding patient's request for pain medication refill.  Advised patient of Tramadol 50 mg Q6hr and wanted to confirm pharmacy.  If no return call, will fax prescription to [facility] on [address]

## 2018-04-06 NOTE — Telephone Encounter (Signed)
-----   Message from Loreli Slot, MD sent at 04/05/2018  5:39 PM EST ----- Yes tramadol will be fine ----- Message ----- From: Steve Rattler, RN Sent: 04/05/2018   3:13 PM EST To: Loreli Slot, MD  She is requesting a refill of pain medication, s/p VATS/ Plication of Diaphragm 03/23/2018.  She is requesting for something not quite as strong as Oxy.  Would Tramadol be ok?  If so, I will print it for you to sign when you get here.  Thanks,  Morrie Sheldon

## 2018-04-16 ENCOUNTER — Other Ambulatory Visit: Payer: Self-pay | Admitting: *Deleted

## 2018-04-16 ENCOUNTER — Other Ambulatory Visit: Payer: Self-pay

## 2018-04-16 DIAGNOSIS — Z9889 Other specified postprocedural states: Secondary | ICD-10-CM

## 2018-04-16 NOTE — Progress Notes (Unsigned)
Dg  

## 2018-04-17 ENCOUNTER — Other Ambulatory Visit: Payer: Self-pay | Admitting: *Deleted

## 2018-04-17 ENCOUNTER — Ambulatory Visit (INDEPENDENT_AMBULATORY_CARE_PROVIDER_SITE_OTHER): Payer: Self-pay | Admitting: Thoracic Surgery (Cardiothoracic Vascular Surgery)

## 2018-04-17 ENCOUNTER — Ambulatory Visit
Admission: RE | Admit: 2018-04-17 | Discharge: 2018-04-17 | Disposition: A | Discharge status: Home / Self Care | Payer: BLUE CROSS/BLUE SHIELD | Source: Ambulatory Visit | Attending: Thoracic Surgery (Cardiothoracic Vascular Surgery) | Admitting: Thoracic Surgery (Cardiothoracic Vascular Surgery)

## 2018-04-17 VITALS — BP 124/76 | HR 92 | Resp 20 | Ht 65.0 in | Wt 195.0 lb

## 2018-04-17 DIAGNOSIS — K449 Diaphragmatic hernia without obstruction or gangrene: Secondary | ICD-10-CM

## 2018-04-17 DIAGNOSIS — Z9889 Other specified postprocedural states: Secondary | ICD-10-CM

## 2018-04-17 DIAGNOSIS — J90 Pleural effusion, not elsewhere classified: Secondary | ICD-10-CM | POA: Diagnosis not present

## 2018-04-17 MED ORDER — PREDNISONE 10 MG (21) PO TBPK: ORAL_TABLET | ORAL | 0 refills | Status: AC

## 2018-04-17 NOTE — Progress Notes (Signed)
[address]       [place] [address]             [phone]    HPI: Samantha Greene returns for scheduled follow-up visit  Samantha Greene is a 48 year old woman with a past history of obesity, hypertension, hyperlipidemia, anxiety, depression, and eventration of the anterior right hemidiaphragm.  She presented with shortness of breath.  Dr. Kendrick Fries determined she had the eventration of the hemidiaphragm.  I did a right VATS for plication of the diaphragm on 03/23/2018.  Postoperatively, she did well.  She went home on day 4.  She still is having a fair amount of pain.  She is not taking any narcotics because she does not like the way they make her feel.  She also has not been taking any anti-inflammatories.  She had some swelling in her legs but that resolved with elevation.  She does feel difficulty taking a deep breath.  Past Medical History:  Diagnosis Date  . Anxiety   . Arthritis   . Depression    no meds  . Dyspnea    can happen anytime  . Family history of breast cancer   . Family history of colon cancer   . Family history of lung cancer   . Family history of pancreatic cancer   . Family history of prostate cancer   . GERD (gastroesophageal reflux disease)   . Hernia, diaphragmatic   . History of kidney stones   . Hyperlipidemia   . Hypertension    no meds  . Pneumonia    1 time    Current Outpatient Medications  Medication Sig Dispense Refill  . acetaminophen (TYLENOL) 500 MG tablet Take 2 tablets (1,000 mg total) by mouth every 6 (six) hours as needed. 30 tablet 0  . albuterol (VENTOLIN HFA) 108 (90 Base) MCG/ACT inhaler Inhale 2 puffs into the lungs every 4 (four) hours as needed for wheezing or shortness of breath. 1 Inhaler 0  . aspirin 81 MG tablet Take 81 mg by mouth daily.    . baclofen (LIORESAL) 10 MG tablet TAKE 1/2 TO 1 TABLET BY MOUTH 2X DAY IF NEEDED FOR MUSCLE SPASM (Patient taking differently: Take 5-10 mg by mouth 2 (two)  times daily as needed for muscle spasms. ) 180 tablet 1  . buPROPion (WELLBUTRIN XL) 300 MG 24 hr tablet TAKE 1 TABLET BY MOUTH EVERY DAY IN THE MORNING (Patient taking differently: Take 300 mg by mouth daily. ) 90 tablet 1  . cetirizine (ZYRTEC) 10 MG tablet Take 10 mg by mouth at bedtime.    . Cholecalciferol (VITAMIN D) 125 MCG (5000 UT) CAPS Take 5,000 Units by mouth daily.     Marland Kitchen escitalopram (LEXAPRO) 20 MG tablet TAKE 1 TABLET BY MOUTH EVERY DAY (Patient taking differently: Take 20 mg by mouth daily. ) 90 tablet 1  . Eszopiclone 3 MG TABS Take 1 tablet (3 mg total) by mouth at bedtime as needed. Take immediately before bedtime (Patient taking differently: Take 3 mg by mouth at bedtime. Take immediately before bedtime) 30 tablet 3  . ibuprofen (ADVIL,MOTRIN) 800 MG tablet Take 1 tablet (800 mg total) by mouth every 8 (eight) hours as needed (breakthrough pain). 30 tablet 0  . meloxicam (MOBIC) 15 MG tablet Take 1 tablet (15 mg total) by mouth daily. 30 tablet 2  . montelukast (SINGULAIR) 10 MG tablet Take 1 tablet daily for Allergies (Patient taking differently: Take 10 mg by  mouth at bedtime. Take 1 tablet daily for Allergies) 90 tablet 3  . nystatin cream (MYCOSTATIN) Apply 1 application topically 2 (two) times daily. (Patient taking differently: Apply 1 application topically 2 (two) times daily as needed for dry skin. ) 30 g 1  . omeprazole (PRILOSEC) 40 MG capsule TAKE 1 CAPSULE BY MOUTH DAILY (Patient taking differently: Take 40 mg by mouth daily. ) 90 capsule 0  . oxyCODONE 10 MG TABS Take 1 tablet (10 mg total) by mouth every 4 (four) hours as needed for severe pain. 40 tablet 0  . pravastatin (PRAVACHOL) 40 MG tablet TAKE 1 TABLET BY MOUTH EVERY DAY (Patient taking differently: Take 40 mg by mouth daily. ) 90 tablet 1  . SPRINTEC 28 0.25-35 MG-MCG tablet TAKE 1 TABLET BY MOUTH EVERY DAY 84 tablet 0  . triamcinolone ointment (KENALOG) 0.1 % Apply 1 application topically 2 (two) times daily.  (Patient taking differently: Apply 1 application topically daily as needed (rash). ) 80 g 1  . vitamin E 600 UNIT capsule Take 600 Units by mouth daily.    . fluticasone (FLONASE) 50 MCG/ACT nasal spray Place 2 sprays into both nostrils daily. 16 g 2   No current facility-administered medications for this visit.     Physical Exam BP 124/76   Pulse 92   Resp 20   Ht 5\' 5"  (1.651 m)   Wt 195 lb (88.5 kg)   SpO2 95% Comment: RA  BMI 32.74 kg/m  48 year old woman in no acute distress Tentative with movements related to her right chest Incision healing well Diminished breath sounds right base, otherwise clear Cardiac regular rate and rhythm normal S1 and S2  Diagnostic Tests: CHEST - 2 VIEW  COMPARISON:  03/27/2018.  FINDINGS: Mediastinum and hilar structures normal. Heart size normal. No pulmonary venous congestion. Right lower lobe atelectasis/infiltrate with moderate right pleural effusion. Pleural has increased from prior exam.  IMPRESSION: Right lower lobe atelectasis/infiltrate with moderate sized right pleural effusion. Pleural effusion has increased in size from prior exam.   Electronically Signed   By: Maisie Fus  Register   On: 04/17/2018 15:51 I personally reviewed the chest x-ray images and concur with the findings noted above.  Impression: Samantha Greene is a 48 year old woman who presented with shortness of breath.  She was found to have elevation of the anterior half of her right hemidiaphragm.  It was unclear whether this was eventration or hernia.  At surgery it turned out that this was eventration due to paralysis of the anterior half of the diaphragm.  I did a right VATS and plication of the diaphragm on 03/23/2018.   She currently is doing well overall.  She does have some incisional soreness but is not taking any narcotics.  I did tell her that she could use nonsteroidal anti-inflammatories such as ibuprofen or naproxen if needed.  I do not think she  should try to drive yet as she is a little tentative with movement related to the incision.  Her chest x-ray today does show a moderate right pleural effusion.  I think we should do a thoracentesis to drain the fluid.  Given the nature of her procedure I think it would be best to have radiology do that with ultrasound guidance rather than try to do it blindly here in the office.  We will arrange for that.  I am going to give her a prednisone taper to see if that will help keep the effusion from recurring.  Plan: Ultrasound-guided thoracentesis  of right pleural effusion Prednisone taper Return in 3 weeks with PA and lateral chest x-ray  Loreli Slot, MD [facility] [phone]

## 2018-04-19 ENCOUNTER — Ambulatory Visit (HOSPITAL_COMMUNITY)
Admission: RE | Admit: 2018-04-19 | Discharge: 2018-04-19 | Disposition: A | Discharge status: Home / Self Care | Payer: BLUE CROSS/BLUE SHIELD | Source: Ambulatory Visit | Attending: Thoracic Surgery (Cardiothoracic Vascular Surgery) | Admitting: Thoracic Surgery (Cardiothoracic Vascular Surgery)

## 2018-04-19 ENCOUNTER — Encounter (HOSPITAL_COMMUNITY): Payer: Self-pay | Admitting: Student

## 2018-04-19 ENCOUNTER — Other Ambulatory Visit: Payer: Self-pay

## 2018-04-19 ENCOUNTER — Other Ambulatory Visit: Payer: Self-pay | Admitting: Thoracic Surgery (Cardiothoracic Vascular Surgery)

## 2018-04-19 DIAGNOSIS — J90 Pleural effusion, not elsewhere classified: Secondary | ICD-10-CM

## 2018-04-19 DIAGNOSIS — Z9889 Other specified postprocedural states: Secondary | ICD-10-CM | POA: Diagnosis not present

## 2018-04-19 HISTORY — PX: IR THORACENTESIS RIGHT ASP PLEURAL SPACE W/IMG GUIDE: IMG5380

## 2018-04-19 MED ORDER — LIDOCAINE HCL 1 % IJ SOLN
INTRAMUSCULAR | Status: AC
  Filled 2018-04-19: qty 20

## 2018-04-19 MED ORDER — LIDOCAINE HCL 1 % IJ SOLN
INTRAMUSCULAR | Status: DC | PRN
  Administered 2018-04-19: 10 mL

## 2018-04-19 NOTE — Procedures (Signed)
PROCEDURE SUMMARY:  Successful US guided therapeutic right thoracentesis. Yielded 500 mL of yellow fluid. Pt tolerated procedure well. No immediate complications.  Specimen was not sent for labs. CXR ordered.  EBL < 5 mL  Hoyt Koch PA-C 04/19/2018 2:35 PM

## 2018-05-01 ENCOUNTER — Ambulatory Visit: Payer: BLUE CROSS/BLUE SHIELD | Admitting: Thoracic Surgery (Cardiothoracic Vascular Surgery)

## 2018-05-14 ENCOUNTER — Other Ambulatory Visit: Payer: Self-pay | Admitting: Thoracic Surgery (Cardiothoracic Vascular Surgery)

## 2018-05-14 DIAGNOSIS — Z9889 Other specified postprocedural states: Secondary | ICD-10-CM

## 2018-05-15 ENCOUNTER — Encounter: Payer: Self-pay | Admitting: Thoracic Surgery (Cardiothoracic Vascular Surgery)

## 2018-05-15 ENCOUNTER — Other Ambulatory Visit: Payer: Self-pay

## 2018-05-15 ENCOUNTER — Ambulatory Visit (INDEPENDENT_AMBULATORY_CARE_PROVIDER_SITE_OTHER): Payer: Self-pay | Admitting: Thoracic Surgery (Cardiothoracic Vascular Surgery)

## 2018-05-15 ENCOUNTER — Ambulatory Visit
Admission: RE | Admit: 2018-05-15 | Discharge: 2018-05-15 | Disposition: A | Discharge status: Home / Self Care | Payer: BLUE CROSS/BLUE SHIELD | Source: Ambulatory Visit | Attending: Thoracic Surgery (Cardiothoracic Vascular Surgery) | Admitting: Thoracic Surgery (Cardiothoracic Vascular Surgery)

## 2018-05-15 VITALS — BP 121/85 | HR 74 | Temp 97.1°F | Resp 16 | Ht 65.0 in | Wt 195.0 lb

## 2018-05-15 DIAGNOSIS — Z9889 Other specified postprocedural states: Secondary | ICD-10-CM

## 2018-05-15 DIAGNOSIS — J90 Pleural effusion, not elsewhere classified: Secondary | ICD-10-CM

## 2018-05-15 DIAGNOSIS — R0602 Shortness of breath: Secondary | ICD-10-CM | POA: Diagnosis not present

## 2018-05-15 NOTE — Progress Notes (Signed)
[address]       [place] [id number]             [phone]     HPI: Mrs. Ave FilterChandler returns for scheduled follow-up visit  Zadie CleverlyBeverly Brensinger is a 48 year old woman with a history of obesity, hypertension, hyperlipidemia, anxiety, depression, and eventration of the right hemidiaphragm.  She presented initially with shortness of breath.  She was found to have elevation of the right hemidiaphragm.  It was really the anterior half of the diaphragm that was elevated.  I did a plication on 03/23/2018.  I saw her in the office on 04/17/2018.  She was still having a good deal of pain at that time but was not taking any narcotics.  She feels better today.  She still gets short of breath with exertion.  Her pain is much improved.  Past Medical History:  Diagnosis Date  . Anxiety   . Arthritis   . Depression    no meds  . Dyspnea    can happen anytime  . Family history of breast cancer   . Family history of colon cancer   . Family history of lung cancer   . Family history of pancreatic cancer   . Family history of prostate cancer   . GERD (gastroesophageal reflux disease)   . Hernia, diaphragmatic   . History of kidney stones   . Hyperlipidemia   . Hypertension    no meds  . Pneumonia    1 time    Current Outpatient Medications  Medication Sig Dispense Refill  . acetaminophen (TYLENOL) 500 MG tablet Take 2 tablets (1,000 mg total) by mouth every 6 (six) hours as needed. 30 tablet 0  . albuterol (VENTOLIN HFA) 108 (90 Base) MCG/ACT inhaler Inhale 2 puffs into the lungs every 4 (four) hours as needed for wheezing or shortness of breath. 1 Inhaler 0  . aspirin 81 MG tablet Take 81 mg by mouth daily.    . baclofen (LIORESAL) 10 MG tablet TAKE 1/2 TO 1 TABLET BY MOUTH 2X DAY IF NEEDED FOR MUSCLE SPASM (Patient taking differently: Take 5-10 mg by mouth 2 (two) times daily as needed for muscle spasms. ) 180 tablet 1  . buPROPion (WELLBUTRIN XL) 300 MG 24 hr tablet TAKE  1 TABLET BY MOUTH EVERY DAY IN THE MORNING (Patient taking differently: Take 300 mg by mouth daily. ) 90 tablet 1  . cetirizine (ZYRTEC) 10 MG tablet Take 10 mg by mouth at bedtime.    . Cholecalciferol (VITAMIN D) 125 MCG (5000 UT) CAPS Take 5,000 Units by mouth daily.     Marland Kitchen. escitalopram (LEXAPRO) 20 MG tablet TAKE 1 TABLET BY MOUTH EVERY DAY (Patient taking differently: Take 20 mg by mouth daily. ) 90 tablet 1  . Eszopiclone 3 MG TABS Take 1 tablet (3 mg total) by mouth at bedtime as needed. Take immediately before bedtime (Patient taking differently: Take 3 mg by mouth at bedtime. Take immediately before bedtime) 30 tablet 3  . fluticasone (FLONASE) 50 MCG/ACT nasal spray Place 2 sprays into both nostrils daily. 16 g 2  . ibuprofen (ADVIL,MOTRIN) 800 MG tablet Take 1 tablet (800 mg total) by mouth every 8 (eight) hours as needed (breakthrough pain). 30 tablet 0  . meloxicam (MOBIC) 15 MG tablet Take 1 tablet (15 mg total) by mouth daily. 30 tablet 2  . montelukast (SINGULAIR) 10 MG tablet Take 1 tablet daily for Allergies (Patient taking differently: Take 10  mg by mouth at bedtime. Take 1 tablet daily for Allergies) 90 tablet 3  . nystatin cream (MYCOSTATIN) Apply 1 application topically 2 (two) times daily. (Patient taking differently: Apply 1 application topically 2 (two) times daily as needed for dry skin. ) 30 g 1  . omeprazole (PRILOSEC) 40 MG capsule TAKE 1 CAPSULE BY MOUTH DAILY (Patient taking differently: Take 40 mg by mouth daily. ) 90 capsule 0  . pravastatin (PRAVACHOL) 40 MG tablet TAKE 1 TABLET BY MOUTH EVERY DAY (Patient taking differently: Take 40 mg by mouth daily. ) 90 tablet 1  . SPRINTEC 28 0.25-35 MG-MCG tablet TAKE 1 TABLET BY MOUTH EVERY DAY 84 tablet 0  . triamcinolone ointment (KENALOG) 0.1 % Apply 1 application topically 2 (two) times daily. (Patient taking differently: Apply 1 application topically daily as needed (rash). ) 80 g 1  . vitamin E 600 UNIT capsule Take 600  Units by mouth daily.     No current facility-administered medications for this visit.     Physical Exam BP 121/85 (BP Location: Right Arm, Patient Position: Sitting, Cuff Size: Large)   Pulse 74   Temp (!) 97.1 F (36.2 C) (Oral)   Resp 16   Ht 5\' 5"  (1.651 m)   Wt 195 lb (88.5 kg)   SpO2 98% Comment: ON RA  BMI 32.17 kg/m  48 year old woman in no acute distress Alert and oriented x3 with no focal deficits Lungs diminished at right base, otherwise clear Cardiac regular rate and rhythm Incisions well-healed  Diagnostic Tests: CHEST - 2 VIEW  COMPARISON:  April 19, 2018  FINDINGS: There is volume loss on the right with right base atelectasis. There may be minimal effusion on the right. The left lung is clear. The heart size and pulmonary vascularity are normal. No adenopathy. No bone lesions. No pneumothorax.  IMPRESSION: Volume loss on the right. Lateral right base atelectasis with questionable minimal right pleural effusion. Left lung clear. Cardiac silhouette within normal limits.   Electronically Signed   By: Bretta Bang III M.D.   On: 05/15/2018 11:06 I personally reviewed the chest x-ray images and concur with the findings noted above  Impression: Marnie Plott is a 48 year old woman who had eventration of the anterior right hemidiaphragm.  She underwent surgical plication on 03/23/2018.  Her postoperative course has been unremarkable.  Her chest x-ray today still shows some elevation of the right hemidiaphragm.  I suspect that there may be some dysfunction of the posterior aspect of the diaphragm that will improve over time.  Overall it is improved from her preoperative film.  She is doing well from a pain standpoint.  There are no restrictions on her activities.  I encouraged her to continue with exercise and weight loss.  Her respiratory status should improve significantly over time.  Plan: Return in 3 months with PA lateral chest x-ray   Loreli Slot, MD [facility] [phone]

## 2018-05-16 ENCOUNTER — Ambulatory Visit: Payer: Self-pay | Admitting: Pulmonary Disease

## 2018-06-07 ENCOUNTER — Telehealth: Payer: Self-pay

## 2018-06-07 NOTE — Telephone Encounter (Signed)
Patient has been around someone that was exposed to Covid. Patient requesting a call back sometime today to discuss what she should do with her medical condition.

## 2018-06-07 NOTE — Telephone Encounter (Signed)
48 y.o. female who recently underwent thoracotomy with plication of diaphragm by Dr. Dorris Fetch on 03/23/2018 call with concerns about possible secondary exposure to covid 19. She reports a few days ago she was exposed to a friend who was around someone with possible covid 21 (not confirmed). She reports her friend does not have symptoms but is reaching out for testing.   She reports she has had intermittent dyspnea, dry cough ongoing since recent surgery; she reports this has not changed recently but is very concerned due to her high risk status.   Reviewed common sx associated with covid 19, advised to start checking temp BID, monitor for new URI, headache, myalgias, changes in taste/smell. Advised that both she and her family should practice aggressive social distancing, avoid unnecessary trips, utilize face masks and reviewed hand hygiene. She should call/message either our office or Dr. Dorris Fetch should she develop new/concerning symptoms. She expresses her appreciation and will contact us back if needed.

## 2018-06-12 ENCOUNTER — Other Ambulatory Visit: Payer: Self-pay | Admitting: Physician Assistant

## 2018-06-12 DIAGNOSIS — F3341 Major depressive disorder, recurrent, in partial remission: Secondary | ICD-10-CM

## 2018-06-26 ENCOUNTER — Encounter: Payer: Self-pay | Admitting: Thoracic Surgery (Cardiothoracic Vascular Surgery)

## 2018-06-28 ENCOUNTER — Ambulatory Visit
Admission: RE | Admit: 2018-06-28 | Discharge: 2018-06-28 | Disposition: A | Discharge status: Home / Self Care | Payer: BLUE CROSS/BLUE SHIELD | Source: Ambulatory Visit | Attending: Adult Health | Admitting: Adult Health

## 2018-06-28 ENCOUNTER — Encounter: Payer: Self-pay | Admitting: Adult Health

## 2018-06-28 ENCOUNTER — Other Ambulatory Visit: Payer: Self-pay

## 2018-06-28 ENCOUNTER — Ambulatory Visit: Payer: BLUE CROSS/BLUE SHIELD | Admitting: Adult Health

## 2018-06-28 DIAGNOSIS — J9811 Atelectasis: Secondary | ICD-10-CM | POA: Diagnosis not present

## 2018-06-28 DIAGNOSIS — R109 Unspecified abdominal pain: Secondary | ICD-10-CM

## 2018-06-28 DIAGNOSIS — R079 Chest pain, unspecified: Secondary | ICD-10-CM

## 2018-06-28 DIAGNOSIS — Z9889 Other specified postprocedural states: Secondary | ICD-10-CM | POA: Diagnosis not present

## 2018-06-28 DIAGNOSIS — J986 Disorders of diaphragm: Secondary | ICD-10-CM | POA: Diagnosis not present

## 2018-06-28 NOTE — Progress Notes (Signed)
Virtual Visit via Telephone Note  I connected with Samantha Greene on 06/28/18 at 11:00 AM EDT by telephone and verified that I am speaking with the correct person using two identifiers.  Location: Patient: home Provider: [facility] office    I discussed the limitations, risks, security and privacy concerns of performing an evaluation and management service by telephone and the availability of in person appointments. I also discussed with the patient that there may be a patient responsible charge related to this service. The patient expressed understanding and agreed to proceed.   History of Present Illness:  There were no vitals taken for this visit.  48 y.o. female with hx of htn, diaphragmatic hernia, GERD, elevation of R hemidiaphragm (presented with exertional dypsnea) s/p recent thoracotomy with plication of diaphragm by by Dr. Charlett Lango on 03/23/2018. Had follow up 04/17/2018 and was felt to be recovering well though with some continued pain, was still having exertional dyspnea.  She requests evaluation today due to RUQ abdominal and flank pain persistent since Sunday after she picked up a bag of kitty liver (35 lb), had misunderstood instructions with weight restrictions. Describes pain as constant sharp pain, in abd/flank, some cramping in chest area. Reports last 2 days have been 8/10, today mildly improved to 5-6/10, non-radiating. Reports improves with lying supine, worse with position changes from supine to upright, and with bending. Denies pain with deep breaths. Endorses mild pain with twisting to the right. No discomfort with eating, BMs. Denies n/v/d. Has sensation of back spasms.   She reports she has exertional dyspnea consistent with her baseline.   She does endorse some mild chest discomfort in the area of her R breast, but this is ongoing and unchanged since surgery.   Denies chest tightness, wheezing, cough, dizziness.   Taking aleve and muscle relaxer (baclofen)  and tramadol via Dr. Dorris Fetch, sleeping a lot with these medications, though does note significant improve.  Observations/Objective:  General : Well sounding patient in no apparent distress HEENT: no hoarseness, no cough for duration of visit, Lungs: speaks in complete sentences, no audible wheezing, no apparent distress, able to take deep breaths and hold without pain or dyspnea Neurological: alert, oriented x 3 Psychiatric: pleasant, judgement appropriate    Assessment and Plan:  [name] was seen today for flank pain.  Diagnoses and all orders for this visit:  Acute right flank pain/Right-sided chest pain S/P Thoracotomy with Plication of Diaphragm History and symptoms uggestive of simple strained muscle vs complication following surgery due to not following weight/lifting restrictions She is managing pain well with aleve/baclofen, PRN tramadol Respiratory sx consistent with baseline  Will get CXR to evaluate diaphragm; HPI is reassuring without change in respiratory sx; pending communication back from Dr. Sunday Corn office  Go to the ER if any worsening chest pain, shortness of breath, nausea, dizziness, syncope, new cough/hemoptysis, hemaptemesis -     DG Chest 2 View; Future   Follow Up Instructions:    I discussed the assessment and treatment plan with the patient. The patient was provided an opportunity to ask questions and all were answered. The patient agreed with the plan and demonstrated an understanding of the instructions.   The patient was advised to call back or seek an in-person evaluation if the symptoms worsen or if the condition fails to improve as anticipated.  I provided 18 minutes of non-face-to-face time during this encounter.   Dan Maker, NP

## 2018-07-02 DIAGNOSIS — Z9889 Other specified postprocedural states: Secondary | ICD-10-CM

## 2018-07-02 DIAGNOSIS — L404 Guttate psoriasis: Secondary | ICD-10-CM

## 2018-07-02 MED ORDER — FLUOCINOLONE ACETONIDE 0.025 % EX OINT: TOPICAL_OINTMENT | Freq: Two times a day (BID) | CUTANEOUS | 0 refills | Status: DC

## 2018-07-02 NOTE — Telephone Encounter (Signed)
THIS ENCOUNTER IS A VIRTUAL VISIT DUE TO COVID-19 - PATIENT WAS NOT SEEN IN THE OFFICE.  PATIENT HAS CONSENTED TO VIRTUAL VISIT / TELEMEDICINE VISIT   Virtual Visit via telephone Note  I connected with Samantha Greene on 07/02/2018 by telephone.  I verified that I am speaking with the correct person using two identifiers.    I discussed the limitations of evaluation and management by telemedicine and the availability of in person appointments. The patient expressed understanding and agreed to proceed.  History of Present Illness: 48 y.o. obese WF with history of psoriasis guttate calls with abnormal red dots x 2 days on bilateral elbows. Non puritic, no warmth, swelling.    S/p thoracotomy with plication of diaphragm, still with some SOB with exertion.   Medications  Current Outpatient Medications (Endocrine & Metabolic):  Marland Kitchen.  SPRINTEC 28 0.25-35 MG-MCG tablet, TAKE 1 TABLET BY MOUTH EVERY DAY  Current Outpatient Medications (Cardiovascular):  .  pravastatin (PRAVACHOL) 40 MG tablet, TAKE 1 TABLET BY MOUTH EVERY DAY (Patient taking differently: Take 40 mg by mouth daily. )  Current Outpatient Medications (Respiratory):  .  albuterol (VENTOLIN HFA) 108 (90 Base) MCG/ACT inhaler, Inhale 2 puffs into the lungs every 4 (four) hours as needed for wheezing or shortness of breath. .  cetirizine (ZYRTEC) 10 MG tablet, Take 10 mg by mouth at bedtime. .  fluticasone (FLONASE) 50 MCG/ACT nasal spray, Place 2 sprays into both nostrils daily. .  montelukast (SINGULAIR) 10 MG tablet, Take 1 tablet daily for Allergies (Patient taking differently: Take 10 mg by mouth at bedtime. Take 1 tablet daily for Allergies)  Current Outpatient Medications (Analgesics):  .  acetaminophen (TYLENOL) 500 MG tablet, Take 2 tablets (1,000 mg total) by mouth every 6 (six) hours as needed. Marland Kitchen.  aspirin 81 MG tablet, Take 81 mg by mouth daily. Marland Kitchen.  ibuprofen (ADVIL,MOTRIN) 800 MG tablet, Take 1 tablet (800 mg total) by  mouth every 8 (eight) hours as needed (breakthrough pain). .  meloxicam (MOBIC) 15 MG tablet, Take 1 tablet (15 mg total) by mouth daily.   Current Outpatient Medications (Other):  .  baclofen (LIORESAL) 10 MG tablet, TAKE 1/2 TO 1 TABLET BY MOUTH 2X DAY IF NEEDED FOR MUSCLE SPASM (Patient taking differently: Take 5-10 mg by mouth 2 (two) times daily as needed for muscle spasms. ) .  buPROPion (WELLBUTRIN XL) 300 MG 24 hr tablet, TAKE 1 TABLET BY MOUTH EVERY DAY IN THE MORNING (Patient taking differently: Take 300 mg by mouth daily. ) .  Cholecalciferol (VITAMIN D) 125 MCG (5000 UT) CAPS, Take 5,000 Units by mouth daily.  Marland Kitchen.  escitalopram (LEXAPRO) 20 MG tablet, TAKE 1 TABLET BY MOUTH EVERY DAY .  Eszopiclone 3 MG TABS, Take 1 tablet (3 mg total) by mouth at bedtime as needed. Take immediately before bedtime (Patient taking differently: Take 3 mg by mouth at bedtime. Take immediately before bedtime) .  fluocinolone (SYNALAR) 0.025 % ointment, Apply topically 2 (two) times daily. Marland Kitchen.  nystatin cream (MYCOSTATIN), Apply 1 application topically 2 (two) times daily. (Patient taking differently: Apply 1 application topically 2 (two) times daily as needed for dry skin. ) .  omeprazole (PRILOSEC) 40 MG capsule, TAKE 1 CAPSULE BY MOUTH DAILY (Patient taking differently: Take 40 mg by mouth daily. ) .  triamcinolone ointment (KENALOG) 0.1 %, Apply 1 application topically 2 (two) times daily. (Patient taking differently: Apply 1 application topically daily as needed (rash). ) .  vitamin E 600 UNIT  capsule, Take 600 Units by mouth daily.  Problem list She has Insomnia; Depression; Hyperlipidemia; Hypertension; Anxiety; Dyspnea; Vitamin D deficiency; Psoriasis, guttate; Morbid obesity (HCC); Carpal tunnel syndrome on both sides; Family history of breast cancer; Family history of colon cancer; Family history of prostate cancer; Family history of lung cancer; Family history of pancreatic cancer; Genetic testing; and  S/P Thoracotomy with Plication of Diaphragm on their problem list.   Observations/Objective: General Appearance:Well sounding, in no apparent distress.  ENT/Mouth: No hoarseness, No cough for duration of visit.  Respiratory: completing full sentences without distress, without audible wheeze Neuro: Awake and oriented X 3,  Psych:  Insight and Judgment appropriate.       Assessment and Plan: Diagnoses and all orders for this visit:  Psoriasis, guttate -     fluocinolone (SYNALAR) 0.025 % ointment; Apply topically 2 (two) times daily. ? May need to go to derm If not better 1 week will refer, call if any new symptoms  S/P Thoracotomy with Plication of Diaphragm Do spirometer and if not better will consider sending to pulmonary rehab?    Follow Up Instructions:  I discussed the assessment and treatment plan with the patient. The patient was provided an opportunity to ask questions and all were answered. The patient agreed with the plan and demonstrated an understanding of the instructions.   The patient was advised to call back or seek an in-person evaluation if the symptoms worsen or if the condition fails to improve as anticipated.  I provided 15 minutes of non-face-to-face time during this encounter.   Quentin Mulling, PA-C

## 2018-07-11 ENCOUNTER — Telehealth: Payer: Self-pay

## 2018-07-11 NOTE — Telephone Encounter (Signed)
A prior Auth was submitted & approved for: FLUOCINOLONE 0.025% from 07/02/2018 to 07/02/2019.   Then approval letter was faxed to [facility] on 07/11/2018 at 9:23am

## 2018-07-13 ENCOUNTER — Other Ambulatory Visit: Payer: Self-pay | Admitting: Internal Medicine

## 2018-07-16 ENCOUNTER — Other Ambulatory Visit: Payer: Self-pay | Admitting: Physician Assistant

## 2018-07-16 DIAGNOSIS — G4700 Insomnia, unspecified: Secondary | ICD-10-CM

## 2018-07-28 ENCOUNTER — Other Ambulatory Visit: Payer: Self-pay | Admitting: Physician Assistant

## 2018-07-28 DIAGNOSIS — M5441 Lumbago with sciatica, right side: Secondary | ICD-10-CM

## 2018-08-02 ENCOUNTER — Other Ambulatory Visit: Payer: Self-pay | Admitting: Adult Health

## 2018-08-09 ENCOUNTER — Other Ambulatory Visit: Payer: Self-pay | Admitting: Thoracic Surgery (Cardiothoracic Vascular Surgery)

## 2018-08-09 DIAGNOSIS — Z9889 Other specified postprocedural states: Secondary | ICD-10-CM

## 2018-08-14 ENCOUNTER — Encounter: Payer: Self-pay | Admitting: Thoracic Surgery (Cardiothoracic Vascular Surgery)

## 2018-08-14 ENCOUNTER — Ambulatory Visit: Payer: BC Managed Care – PPO | Admitting: Thoracic Surgery (Cardiothoracic Vascular Surgery)

## 2018-08-14 ENCOUNTER — Other Ambulatory Visit: Payer: Self-pay

## 2018-08-14 ENCOUNTER — Ambulatory Visit
Admission: RE | Admit: 2018-08-14 | Discharge: 2018-08-14 | Disposition: A | Discharge status: Home / Self Care | Payer: BC Managed Care – PPO | Source: Ambulatory Visit | Attending: Thoracic Surgery (Cardiothoracic Vascular Surgery) | Admitting: Thoracic Surgery (Cardiothoracic Vascular Surgery)

## 2018-08-14 VITALS — BP 135/90 | HR 82 | Temp 97.9°F | Resp 20 | Ht 65.0 in | Wt 207.0 lb

## 2018-08-14 DIAGNOSIS — Z9889 Other specified postprocedural states: Secondary | ICD-10-CM

## 2018-08-14 DIAGNOSIS — J986 Disorders of diaphragm: Secondary | ICD-10-CM | POA: Diagnosis not present

## 2018-08-14 DIAGNOSIS — K449 Diaphragmatic hernia without obstruction or gangrene: Secondary | ICD-10-CM | POA: Diagnosis not present

## 2018-08-14 NOTE — Progress Notes (Signed)
[address]       [place] [address]             [phone]     HPI: [name] returns for a scheduled follow-up visit  Samantha Greene is a 48 year old woman with a history of hypertension, hyperlipidemia, obesity, anxiety, depression, and eventration of the right hemidiaphragm.  She initially presented with shortness of breath.  She was found to have asymmetric anterior elevation of the right hemidiaphragm.  I did a plication on 6/44/0347.    I last saw her in the office on 05/15/2018.  She was still having some shortness of breath with exertion although overall she was getting better and her pain was improved.  She is doing very well from a pain standpoint.  She does notice occasional sharp pain but it is relatively rare.  She is not taking anything for pain.  She does still have some limitations due to shortness of breath.  She has not been able to lose any weight. Past Medical History:  Diagnosis Date  . Anxiety   . Arthritis   . Depression    no meds  . Dyspnea    can happen anytime  . Family history of breast cancer   . Family history of colon cancer   . Family history of lung cancer   . Family history of pancreatic cancer   . Family history of prostate cancer   . GERD (gastroesophageal reflux disease)   . Hernia, diaphragmatic   . History of kidney stones   . Hyperlipidemia   . Hypertension    no meds  . Pneumonia    1 time    Current Outpatient Medications  Medication Sig Dispense Refill  . acetaminophen (TYLENOL) 500 MG tablet Take 2 tablets (1,000 mg total) by mouth every 6 (six) hours as needed. 30 tablet 0  . albuterol (VENTOLIN HFA) 108 (90 Base) MCG/ACT inhaler Inhale 2 puffs into the lungs every 4 (four) hours as needed for wheezing or shortness of breath. 1 Inhaler 0  . aspirin 81 MG tablet Take 81 mg by mouth daily.    . baclofen (LIORESAL) 10 MG tablet TAKE 1/2 TO 1 TABLET BY MOUTH 2X DAY IF NEEDED FOR MUSCLE SPASM (Patient  taking differently: Take 5-10 mg by mouth 2 (two) times daily as needed for muscle spasms. ) 180 tablet 1  . buPROPion (WELLBUTRIN XL) 300 MG 24 hr tablet TAKE 1 TABLET BY MOUTH EVERY DAY IN THE MORNING (Patient taking differently: Take 300 mg by mouth daily. ) 90 tablet 1  . cetirizine (ZYRTEC) 10 MG tablet Take 10 mg by mouth at bedtime.    . Cholecalciferol (VITAMIN D) 125 MCG (5000 UT) CAPS Take 5,000 Units by mouth daily.     Marland Kitchen escitalopram (LEXAPRO) 20 MG tablet TAKE 1 TABLET BY MOUTH EVERY DAY 90 tablet 1  . Eszopiclone 3 MG TABS TAKE 1 TABLET BY MOUTH AT BEDTIME AS NEEDED. TAKE IMMEDIATELY BEFORE BEDTIME 30 tablet 3  . fluocinolone (SYNALAR) 0.025 % ointment Apply topically 2 (two) times daily. 30 g 0  . ibuprofen (ADVIL,MOTRIN) 800 MG tablet Take 1 tablet (800 mg total) by mouth every 8 (eight) hours as needed (breakthrough pain). 30 tablet 0  . meloxicam (MOBIC) 15 MG tablet Take 1/2 to 1 tablet Daily for Pain & Inflammation & limit to 4-5 tablets  /week to Avoid Kidney Damage 90 tablet 0  . montelukast (SINGULAIR) 10 MG tablet Take  1 tablet (10 mg total) by mouth at bedtime. Take 1 tablet daily for Allergies 90 tablet 3  . nystatin cream (MYCOSTATIN) Apply 1 application topically 2 (two) times daily. (Patient taking differently: Apply 1 application topically 2 (two) times daily as needed for dry skin. ) 30 g 1  . omeprazole (PRILOSEC) 40 MG capsule TAKE 1 CAPSULE BY MOUTH EVERY DAY 90 capsule 0  . pravastatin (PRAVACHOL) 40 MG tablet TAKE 1 TABLET BY MOUTH EVERY DAY (Patient taking differently: Take 40 mg by mouth daily. ) 90 tablet 1  . SPRINTEC 28 0.25-35 MG-MCG tablet TAKE 1 TABLET BY MOUTH EVERY DAY 84 tablet 0  . triamcinolone ointment (KENALOG) 0.1 % Apply 1 application topically 2 (two) times daily. (Patient taking differently: Apply 1 application topically daily as needed (rash). ) 80 g 1  . vitamin E 600 UNIT capsule Take 600 Units by mouth daily.    . fluticasone (FLONASE) 50  MCG/ACT nasal spray Place 2 sprays into both nostrils daily. 16 g 2   No current facility-administered medications for this visit.     Physical Exam BP 135/90   Pulse 82   Temp 97.9 F (36.6 C) (Skin)   Resp 20   Ht 5\' 5"  (1.651 m)   Wt 207 lb (93.9 kg)   SpO2 96% Comment: RA  BMI 34.8345 kg/m  48 year old woman in no acute distress Alert and oriented x3 with no focal deficits Diminished breath sounds right base, no rales or wheezing Cardiac regular rate and rhythm Incisions well-healed  Diagnostic Tests: CHEST - 2 VIEW  COMPARISON:  Chest x-ray dated Jun 28, 2018.  FINDINGS: The heart size and mediastinal contours are within normal limits. Normal pulmonary vascularity. No focal consolidation, pleural effusion, or pneumothorax. Unchanged elevation of the right hemidiaphragm with chronic right lung base subsegmental atelectasis/scarring. No acute osseous abnormality.  IMPRESSION: 1.  No active cardiopulmonary disease. 2. Unchanged chronically elevated right hemidiaphragm.   Electronically Signed   By: Obie DredgeWilliam T Derry M.D.   On: 08/14/2018 11:22 I personally reviewed the chest x-ray images and concur with the findings noted above.  Impression: Samantha CleverlyBeverly Greene is a 48 year old woman with a past medical history significant for hypertension, hyperlipidemia, obesity, anxiety, depression, and eventration of the right hemidiaphragm.  She underwent a right thoracotomy for plication in February 2020.  Her chest x-ray shows a good postoperative result.  The diaphragm on the right is still higher than the left but it is markedly improved from her pre-operative imaging.  She may still have some phrenic nerve dysfunction on that side that may continue to improve with time but overall it is better than preop.  Shortness of breath is likely multifactorial.  Emphasized the importance of weight loss and regular exercise.  Plan: Return in 6 months with PA lateral chest x-ray   Loreli SlotSteven C Cobe Viney, MD [facility] [phone]

## 2018-08-18 ENCOUNTER — Other Ambulatory Visit: Payer: Self-pay | Admitting: Physician Assistant

## 2018-08-18 DIAGNOSIS — L404 Guttate psoriasis: Secondary | ICD-10-CM

## 2018-08-24 DIAGNOSIS — R2689 Other abnormalities of gait and mobility: Secondary | ICD-10-CM | POA: Diagnosis not present

## 2018-08-24 DIAGNOSIS — M722 Plantar fascial fibromatosis: Secondary | ICD-10-CM | POA: Diagnosis not present

## 2018-08-24 DIAGNOSIS — L03032 Cellulitis of left toe: Secondary | ICD-10-CM | POA: Diagnosis not present

## 2018-08-26 ENCOUNTER — Other Ambulatory Visit: Payer: Self-pay | Admitting: Internal Medicine

## 2018-08-28 NOTE — Progress Notes (Deleted)
SUBJECTIVE:  Samantha Greene is a 48 y.o. female who presents for palliative resection of a toenail.   There were no vitals taken for this visit.   OBJECTIVE: Patient appears well, normal vital signs. {right, left-initial cap:315607} nail reveals ingrown edge with tenderness.  Vital signs as noted above. Appearance: {appearance:315021::"alert, well appearing, and in no distress"}. Foot exam: {foot KPVV:748270}.  ASSESSMENT: Toe {BE:675449}  ASSESSMENT: ingrown toenail  PLAN: Informed consent is obtained. Using a 50-50 mixture of 1% plain lidocaine and 0.5% plain marcaine, a ring block was done (6 cc total). Using a tourniquet for hemostasis and sterile instruments, I freed the nail from the nail bed and removed a wedge of the nail including the ingrown portion to the level of the nail skin fold. This was well tolerated, minimal bleeding. Antibiotic ointment and a dressing are applied. Tylenol with Codeine #3, 1-2 tabs po q4h prn pain is given. Remove the dressing tomorrow and begin frequent soaks, complete her antibiotics and have a follow up visit in a week. Call if pain, erythema fever or bleeding. Wound care and dressing instructions are given.

## 2018-08-30 ENCOUNTER — Encounter: Payer: BLUE CROSS/BLUE SHIELD | Admitting: Physician Assistant

## 2018-09-08 ENCOUNTER — Other Ambulatory Visit: Payer: Self-pay | Admitting: Physician Assistant

## 2018-09-11 ENCOUNTER — Ambulatory Visit (INDEPENDENT_AMBULATORY_CARE_PROVIDER_SITE_OTHER): Payer: BC Managed Care – PPO | Admitting: Physician Assistant

## 2018-09-11 ENCOUNTER — Encounter: Payer: Self-pay | Admitting: Physician Assistant

## 2018-09-11 ENCOUNTER — Other Ambulatory Visit: Payer: Self-pay

## 2018-09-11 VITALS — BP 130/76 | HR 87 | Temp 97.3°F | Ht 65.0 in | Wt 207.4 lb

## 2018-09-11 DIAGNOSIS — F330 Major depressive disorder, recurrent, mild: Secondary | ICD-10-CM

## 2018-09-11 DIAGNOSIS — R0609 Other forms of dyspnea: Secondary | ICD-10-CM

## 2018-09-11 DIAGNOSIS — G4700 Insomnia, unspecified: Secondary | ICD-10-CM

## 2018-09-11 DIAGNOSIS — Z9889 Other specified postprocedural states: Secondary | ICD-10-CM

## 2018-09-11 DIAGNOSIS — R7309 Other abnormal glucose: Secondary | ICD-10-CM

## 2018-09-11 DIAGNOSIS — E782 Mixed hyperlipidemia: Secondary | ICD-10-CM | POA: Diagnosis not present

## 2018-09-11 DIAGNOSIS — E559 Vitamin D deficiency, unspecified: Secondary | ICD-10-CM | POA: Diagnosis not present

## 2018-09-11 DIAGNOSIS — I10 Essential (primary) hypertension: Secondary | ICD-10-CM | POA: Diagnosis not present

## 2018-09-11 DIAGNOSIS — Z79899 Other long term (current) drug therapy: Secondary | ICD-10-CM | POA: Diagnosis not present

## 2018-09-11 MED ORDER — ROSUVASTATIN CALCIUM 5 MG PO TABS: 5.0000 mg | ORAL_TABLET | Freq: Every day | ORAL | 3 refills | Status: DC

## 2018-09-11 NOTE — Patient Instructions (Signed)
Will switch from pravastatin to crestor, stop pravastatin and start crestor 5 mg once daily  Will set up for sleep study, she has insomnia, still wakes up frequently, wakes up tired, has SOB with exertion.  Will also set up for pulmonary rehab since her surgery she can now start exercising and wants to do it safely.   I think it is possible that you have sleep apnea. It can cause interrupted sleep, headaches, frequent awakenings, fatigue, dry mouth, fast/slow heart beats, memory issues, anxiety/depression, swelling, numbness tingling hands/feet, weight gain, shortness of breath, and the list goes on. Sleep apnea needs to be ruled out because if it is left untreated it does eventually lead to abnormal heart beats, lung failure or heart failure as well as increasing the risk of heart attack and stroke. There are masks you can wear OR a mouth piece that I can give you information about. Often times though people feel MUCH better after getting treatment.   Sleep Apnea  Sleep apnea is a sleep disorder characterized by abnormal pauses in breathing while you sleep. When your breathing pauses, the level of oxygen in your blood decreases. This causes you to move out of deep sleep and into light sleep. As a result, your quality of sleep is poor, and the system that carries your blood throughout your body (cardiovascular system) experiences stress. If sleep apnea remains untreated, the following conditions can develop:  High blood pressure (hypertension).  Coronary artery disease.  Inability to achieve or maintain an erection (impotence).  Impairment of your thought process (cognitive dysfunction). There are three types of sleep apnea: 1. Obstructive sleep apnea--Pauses in breathing during sleep because of a blocked airway. 2. Central sleep apnea--Pauses in breathing during sleep because the area of the brain that controls your breathing does not send the correct signals to the muscles that control  breathing. 3. Mixed sleep apnea--A combination of both obstructive and central sleep apnea.  RISK FACTORS The following risk factors can increase your risk of developing sleep apnea:  Being overweight.  Smoking.  Having narrow passages in your nose and throat.  Being of older age.  Being female.  Alcohol use.  Sedative and tranquilizer use.  Ethnicity. Among individuals younger than 35 years, African Americans are at increased risk of sleep apnea. SYMPTOMS   Difficulty staying asleep.  Daytime sleepiness and fatigue.  Loss of energy.  Irritability.  Loud, heavy snoring.  Morning headaches.  Trouble concentrating.  Forgetfulness.  Decreased interest in sex. DIAGNOSIS  In order to diagnose sleep apnea, your caregiver will perform a physical examination. Your caregiver may suggest that you take a home sleep test. Your caregiver may also recommend that you spend the night in a sleep lab. In the sleep lab, several monitors record information about your heart, lungs, and brain while you sleep. Your leg and arm movements and blood oxygen level are also recorded. TREATMENT The following actions may help to resolve mild sleep apnea:  Sleeping on your side.   Using a decongestant if you have nasal congestion.   Avoiding the use of depressants, including alcohol, sedatives, and narcotics.   Losing weight and modifying your diet if you are overweight. There also are devices and treatments to help open your airway:  Oral appliances. These are custom-made mouthpieces that shift your lower jaw forward and slightly open your bite. This opens your airway.  Devices that create positive airway pressure. This positive pressure "splints" your airway open to help you breathe better during  sleep. The following devices create positive airway pressure:  Continuous positive airway pressure (CPAP) device. The CPAP device creates a continuous level of air pressure with an air pump. The  air is delivered to your airway through a mask while you sleep. This continuous pressure keeps your airway open.  Nasal expiratory positive airway pressure (EPAP) device. The EPAP device creates positive air pressure as you exhale. The device consists of single-use valves, which are inserted into each nostril and held in place by adhesive. The valves create very little resistance when you inhale but create much more resistance when you exhale. That increased resistance creates the positive airway pressure. This positive pressure while you exhale keeps your airway open, making it easier to breath when you inhale again.  Bilevel positive airway pressure (BPAP) device. The BPAP device is used mainly in patients with central sleep apnea. This device is similar to the CPAP device because it also uses an air pump to deliver continuous air pressure through a mask. However, with the BPAP machine, the pressure is set at two different levels. The pressure when you exhale is lower than the pressure when you inhale.  Surgery. Typically, surgery is only done if you cannot comply with less invasive treatments or if the less invasive treatments do not improve your condition. Surgery involves removing excess tissue in your airway to create a wider passage way. Document Released: 01/07/2002 Document Revised: 05/14/2012 Document Reviewed: 05/26/2011 [facility] Patient Information 2015 [place], [place]. This information is not intended to replace advice given to you by your health care provider. Make sure you discuss any questions you have with your health care provider.

## 2018-09-11 NOTE — Progress Notes (Addendum)
Assessment and Plan:  Essential hypertension - continue medications, DASH diet, exercise and monitor at home. Call if greater than 130/80.  -     CBC with Differential/Platelet -     COMPLETE METABOLIC PANEL WITH GFR -     TSH  Mixed hyperlipidemia check lipids decrease fatty foods increase activity.  -     Lipid panel  Morbid obesity (HCC) Continue weight loss, continue walking -     Hemoglobin A1c  Insomnia, unspecified type -     Eszopiclone (ESZOPICLONE) 3 MG TABS; Take 1 tablet (3 mg total) by mouth  - + snoring, frequent awakenings, SOB, set up sleep study  Recurrent major depressive disorder, in partial remission (HCC) Continue mediciations  DOE No chest pain, s/p VATS Will try to set up for pulmonary rehab and sleep study  Further disposition pending results of labs. Discussed med's effects and SE's.   Over 30 minutes of exam, counseling, chart review, and critical decision making was performed.   Future Appointments  Date Time Provider Department Center  01/17/2019  2:00 PM Genia DelLavoie, Marie-Lyne, MD [facility]  03/18/2019 10:00 AM Quentin Mullingollier, Bryston Colocho, PA-C [id number] None    ------------------------------------------------------------------------------------------------------------------   HPI BP 130/76   Pulse 87   Temp (!) 97.3 F (36.3 C)   Ht 5\' 5"  (1.651 m)   Wt 207 lb 6.4 oz (94.1 kg)   SpO2 99%   BMI 34.51 kg/m   48 y.o.female presents for follow up for weight loss.   BMI is Body mass index is 34.51 kg/m., she is working on diet and exercise. Wt Readings from Last 3 Encounters:  09/11/18 207 lb 6.4 oz (94.1 kg)  08/14/18 207 lb (93.9 kg)  05/15/18 195 lb (88.5 kg)   Her blood pressure has been controlled at home, today their BP is BP: 130/76  She does workout. She denies chest pain,  Dizziness. She is a/p VATS and is having SOB with exertion. No CP, no fever, chills, cough.  She snores at night, wakes up tired. She wants to work out but she is  afraid to start.  .  She is on cholesterol medication and denies myalgias. Her cholesterol is not at goal. The cholesterol last visit was:   Lab Results  Component Value Date   CHOL 210 (H) 03/08/2018   HDL 76 03/08/2018   LDLCALC 108 (H) 03/08/2018   TRIG 150 (H) 03/08/2018   CHOLHDL 2.8 03/08/2018    Patient is on Vitamin D supplement.   Lab Results  Component Value Date   VD25OH 35 03/08/2018       Past Medical History:  Diagnosis Date  . Anxiety   . Arthritis   . Depression    no meds  . Dyspnea    can happen anytime  . Family history of breast cancer   . Family history of colon cancer   . Family history of lung cancer   . Family history of pancreatic cancer   . Family history of prostate cancer   . GERD (gastroesophageal reflux disease)   . Hernia, diaphragmatic   . History of kidney stones   . Hyperlipidemia   . Hypertension    no meds  . Pneumonia    1 time     Allergies  Allergen Reactions  . Erythromycin Nausea Only and Other (See Comments)    Stomach cramping (can take Z Pak)  . Flexeril [Cyclobenzaprine Hcl] Other (See Comments)    "don't like the way I feel"  .  Metformin And Related Other (See Comments)    GI Upset    Current Outpatient Medications on File Prior to Visit  Medication Sig  . acetaminophen (TYLENOL) 500 MG tablet Take 2 tablets (1,000 mg total) by mouth every 6 (six) hours as needed.  Marland Kitchen. albuterol (VENTOLIN HFA) 108 (90 Base) MCG/ACT inhaler Inhale 2 puffs into the lungs every 4 (four) hours as needed for wheezing or shortness of breath.  Marland Kitchen. aspirin 81 MG tablet Take 81 mg by mouth daily.  . baclofen (LIORESAL) 10 MG tablet TAKE 1/2 TO 1 TABLET BY MOUTH 2X DAY IF NEEDED FOR MUSCLE SPASM (Patient taking differently: Take 5-10 mg by mouth 2 (two) times daily as needed for muscle spasms. )  . buPROPion (WELLBUTRIN XL) 300 MG 24 hr tablet TAKE 1 TABLET BY MOUTH EVERY DAY IN THE MORNING (Patient taking differently: Take 300 mg by mouth  daily. )  . cetirizine (ZYRTEC) 10 MG tablet Take 10 mg by mouth at bedtime.  . Cholecalciferol (VITAMIN D) 125 MCG (5000 UT) CAPS Take 5,000 Units by mouth daily.   Marland Kitchen. escitalopram (LEXAPRO) 20 MG tablet TAKE 1 TABLET BY MOUTH EVERY DAY  . Eszopiclone 3 MG TABS TAKE 1 TABLET BY MOUTH AT BEDTIME AS NEEDED. TAKE IMMEDIATELY BEFORE BEDTIME  . fluocinolone (SYNALAR) 0.025 % ointment APPLY TO AFFECTED AREA TWICE A DAY  . ibuprofen (ADVIL,MOTRIN) 800 MG tablet Take 1 tablet (800 mg total) by mouth every 8 (eight) hours as needed (breakthrough pain).  . meloxicam (MOBIC) 15 MG tablet Take 1/2 to 1 tablet Daily for Pain & Inflammation & limit to 4-5 tablets  /week to Avoid Kidney Damage  . montelukast (SINGULAIR) 10 MG tablet Take 1 tablet (10 mg total) by mouth at bedtime. Take 1 tablet daily for Allergies  . nystatin cream (MYCOSTATIN) Apply 1 application topically 2 (two) times daily. (Patient taking differently: Apply 1 application topically 2 (two) times daily as needed for dry skin. )  . omeprazole (PRILOSEC) 40 MG capsule TAKE 1 CAPSULE BY MOUTH EVERY DAY  . SPRINTEC 28 0.25-35 MG-MCG tablet TAKE 1 TABLET BY MOUTH EVERY DAY  . telmisartan (MICARDIS) 40 MG tablet Take 1 tablet Daily for BP  . triamcinolone ointment (KENALOG) 0.1 % Apply 1 application topically 2 (two) times daily. (Patient taking differently: Apply 1 application topically daily as needed (rash). )  . vitamin E 600 UNIT capsule Take 600 Units by mouth daily.  . fluticasone (FLONASE) 50 MCG/ACT nasal spray Place 2 sprays into both nostrils daily.   No current facility-administered medications on file prior to visit.     ROS: Review of Systems  Constitutional: Negative for chills, fever, malaise/fatigue and weight loss.  HENT: Negative for hearing loss and tinnitus.   Eyes: Negative for blurred vision and double vision.  Respiratory: Positive for shortness of breath. Negative for cough, hemoptysis, sputum production and wheezing.    Cardiovascular: Positive for leg swelling. Negative for chest pain, palpitations, orthopnea, claudication and PND.  Gastrointestinal: Negative for abdominal pain, blood in stool, constipation, diarrhea, heartburn, melena, nausea and vomiting.  Genitourinary: Negative.   Musculoskeletal: Negative for back pain, joint pain, myalgias and neck pain.  Skin: Negative for rash.  Neurological: Negative for dizziness, tingling, sensory change, weakness and headaches.  Endo/Heme/Allergies: Negative for polydipsia.  Psychiatric/Behavioral: The patient has insomnia.   All other systems reviewed and are negative.   Physical Exam:  BP 130/76   Pulse 87   Temp (!) 97.3 F (36.3 C)  Ht 5\' 5"  (1.651 m)   Wt 207 lb 6.4 oz (94.1 kg)   SpO2 99%   BMI 34.51 kg/m   General Appearance: Well nourished, in no apparent distress. Eyes: PERRLA, EOMs, conjunctiva no swelling or erythema ENT/Mouth: No erythema, swelling, or exudate on post pharynx.  Tonsils not swollen or erythematous. Hearing normal.  Neck: Supple, thyroid normal.  Respiratory: Respiratory effort normal, BS equal except decreased RLLwithout rales, rhonchi, wheezing or stridor.  Cardio: RRR with no MRGs. Brisk peripheral pulses without edema.  Abdomen: Soft, + BS.  Non tender, no guarding, rebound, hernias, masses. Lymphatics: Non tender without lymphadenopathy.  Musculoskeletal: Full ROM, 5/5 strength, normal gait. Skin: Warm, dry without rashes, lesions, ecchymosis.  Neuro: Cranial nerves intact. Normal muscle tone, no cerebellar symptoms. Sensation intact.  Psych: Awake and oriented X 3, normal affect, Insight and Judgment appropriate.     Vicie Mutters, PA-C 3:20 PM [facility]

## 2018-09-12 ENCOUNTER — Other Ambulatory Visit: Payer: Self-pay | Admitting: *Deleted

## 2018-09-12 DIAGNOSIS — R0609 Other forms of dyspnea: Secondary | ICD-10-CM

## 2018-09-12 LAB — COMPLETE METABOLIC PANEL WITH GFR
AG Ratio: 1.7 (calc) (ref 1.0–2.5)
ALT: 19 U/L (ref 6–29)
AST: 21 U/L (ref 10–35)
Albumin: 4.3 g/dL (ref 3.6–5.1)
Alkaline phosphatase (APISO): 70 U/L (ref 31–125)
BUN: 13 mg/dL (ref 7–25)
CO2: 26 mmol/L (ref 20–32)
Calcium: 9.5 mg/dL (ref 8.6–10.2)
Chloride: 104 mmol/L (ref 98–110)
Creat: 0.95 mg/dL (ref 0.50–1.10)
GFR, Est African American: 82 mL/min/{1.73_m2} (ref >=60)
GFR, Est Non African American: 71 mL/min/{1.73_m2} (ref >=60)
Globulin: 2.5 g/dL (calc) (ref 1.9–3.7)
Glucose, Bld: 84 mg/dL (ref 65–99)
Potassium: 4.4 mmol/L (ref 3.5–5.3)
Sodium: 139 mmol/L (ref 135–146)
Total Bilirubin: 0.4 mg/dL (ref 0.2–1.2)
Total Protein: 6.8 g/dL (ref 6.1–8.1)

## 2018-09-12 LAB — HEMOGLOBIN A1C
Hgb A1c MFr Bld: 5.6 % of total Hgb (ref <5.7)
Mean Plasma Glucose: 114 (calc)
eAG (mmol/L): 6.3 (calc)

## 2018-09-12 LAB — LIPID PANEL
Cholesterol: 226 mg/dL — ABNORMAL HIGH (ref <200)
HDL: 62 mg/dL (ref >=50)
LDL Cholesterol (Calc): 132 mg/dL (calc) — ABNORMAL HIGH
Non-HDL Cholesterol (Calc): 164 mg/dL (calc) — ABNORMAL HIGH (ref <130)
Total CHOL/HDL Ratio: 3.6 (calc) (ref <5.0)
Triglycerides: 188 mg/dL — ABNORMAL HIGH (ref <150)

## 2018-09-12 LAB — CBC WITH DIFFERENTIAL/PLATELET
Absolute Monocytes: 505 cells/uL (ref 200–950)
Basophils Absolute: 29 cells/uL (ref 0–200)
Basophils Relative: 0.5 %
Eosinophils Absolute: 128 cells/uL (ref 15–500)
Eosinophils Relative: 2.2 %
HCT: 37.8 % (ref 35.0–45.0)
Hemoglobin: 12.8 g/dL (ref 11.7–15.5)
Lymphs Abs: 1989 cells/uL (ref 850–3900)
MCH: 31.7 pg (ref 27.0–33.0)
MCHC: 33.9 g/dL (ref 32.0–36.0)
MCV: 93.6 fL (ref 80.0–100.0)
MPV: 9.3 fL (ref 7.5–12.5)
Monocytes Relative: 8.7 %
Neutro Abs: 3149 cells/uL (ref 1500–7800)
Neutrophils Relative %: 54.3 %
Platelets: 299 10*3/uL (ref 140–400)
RBC: 4.04 10*6/uL (ref 3.80–5.10)
RDW: 12.5 % (ref 11.0–15.0)
Total Lymphocyte: 34.3 %
WBC: 5.8 10*3/uL (ref 3.8–10.8)

## 2018-09-12 LAB — TSH: TSH: 2.12 mIU/L

## 2018-09-12 LAB — VITAMIN D 25 HYDROXY (VIT D DEFICIENCY, FRACTURES): Vit D, 25-Hydroxy: 33 ng/mL (ref 30–100)

## 2018-09-12 LAB — MAGNESIUM: Magnesium: 2.1 mg/dL (ref 1.5–2.5)

## 2018-09-13 ENCOUNTER — Telehealth (HOSPITAL_COMMUNITY): Payer: Self-pay | Admitting: Radiology

## 2018-09-13 NOTE — Telephone Encounter (Signed)
Returned patient's call to schedule echocardiogram-left message to call back.

## 2018-09-16 ENCOUNTER — Other Ambulatory Visit: Payer: Self-pay | Admitting: Internal Medicine

## 2018-09-16 DIAGNOSIS — M5441 Lumbago with sciatica, right side: Secondary | ICD-10-CM

## 2018-09-17 ENCOUNTER — Other Ambulatory Visit: Payer: Self-pay | Admitting: Physician Assistant

## 2018-09-17 MED ORDER — GABAPENTIN 300 MG PO CAPS: 300.0000 mg | ORAL_CAPSULE | Freq: Three times a day (TID) | ORAL | 2 refills | Status: DC

## 2018-09-18 ENCOUNTER — Ambulatory Visit (HOSPITAL_COMMUNITY): Payer: BC Managed Care – PPO | Attending: Cardiology

## 2018-09-18 ENCOUNTER — Other Ambulatory Visit: Payer: Self-pay

## 2018-09-18 DIAGNOSIS — R0609 Other forms of dyspnea: Secondary | ICD-10-CM | POA: Insufficient documentation

## 2018-09-24 ENCOUNTER — Encounter: Payer: BC Managed Care – PPO | Attending: Internal Medicine | Admitting: *Deleted

## 2018-09-24 ENCOUNTER — Encounter: Payer: Self-pay | Admitting: *Deleted

## 2018-09-24 ENCOUNTER — Other Ambulatory Visit: Payer: Self-pay

## 2018-09-24 DIAGNOSIS — R0609 Other forms of dyspnea: Secondary | ICD-10-CM

## 2018-09-24 NOTE — Progress Notes (Signed)
Virtual Orientation completed today for Pulmonary Rehab. EP/RD appt on 8/26  Documentation for diagnosis can be found in [place] 5/28 and 8/11

## 2018-09-26 ENCOUNTER — Other Ambulatory Visit: Payer: Self-pay | Admitting: Physician Assistant

## 2018-09-26 DIAGNOSIS — F330 Major depressive disorder, recurrent, mild: Secondary | ICD-10-CM

## 2018-10-10 ENCOUNTER — Ambulatory Visit: Payer: BC Managed Care – PPO | Admitting: Neurology

## 2018-10-10 ENCOUNTER — Other Ambulatory Visit: Payer: Self-pay

## 2018-10-10 ENCOUNTER — Encounter: Payer: Self-pay | Admitting: Neurology

## 2018-10-10 VITALS — BP 124/84 | HR 82 | Temp 98.4°F | Ht 60.5 in | Wt 209.0 lb

## 2018-10-10 DIAGNOSIS — K440 Diaphragmatic hernia with obstruction, without gangrene: Secondary | ICD-10-CM | POA: Diagnosis not present

## 2018-10-10 DIAGNOSIS — R0683 Snoring: Secondary | ICD-10-CM

## 2018-10-10 DIAGNOSIS — R0602 Shortness of breath: Secondary | ICD-10-CM

## 2018-10-10 DIAGNOSIS — Z6838 Body mass index (BMI) 38.0-38.9, adult: Secondary | ICD-10-CM

## 2018-10-10 DIAGNOSIS — F5104 Psychophysiologic insomnia: Secondary | ICD-10-CM

## 2018-10-10 DIAGNOSIS — E662 Morbid (severe) obesity with alveolar hypoventilation: Secondary | ICD-10-CM

## 2018-10-10 NOTE — Patient Instructions (Signed)
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
## 2018-10-11 ENCOUNTER — Encounter: Payer: BC Managed Care – PPO | Attending: Internal Medicine

## 2018-10-11 ENCOUNTER — Other Ambulatory Visit: Payer: Self-pay

## 2018-10-11 VITALS — Ht 60.5 in | Wt 210.1 lb

## 2018-10-11 DIAGNOSIS — F419 Anxiety disorder, unspecified: Secondary | ICD-10-CM | POA: Insufficient documentation

## 2018-10-11 DIAGNOSIS — Z803 Family history of malignant neoplasm of breast: Secondary | ICD-10-CM | POA: Insufficient documentation

## 2018-10-11 DIAGNOSIS — Z79899 Other long term (current) drug therapy: Secondary | ICD-10-CM | POA: Insufficient documentation

## 2018-10-11 DIAGNOSIS — Z801 Family history of malignant neoplasm of trachea, bronchus and lung: Secondary | ICD-10-CM | POA: Insufficient documentation

## 2018-10-11 DIAGNOSIS — F329 Major depressive disorder, single episode, unspecified: Secondary | ICD-10-CM | POA: Diagnosis not present

## 2018-10-11 DIAGNOSIS — Z8042 Family history of malignant neoplasm of prostate: Secondary | ICD-10-CM | POA: Insufficient documentation

## 2018-10-11 DIAGNOSIS — Z800 Family history of malignant neoplasm of digestive organs: Secondary | ICD-10-CM | POA: Diagnosis not present

## 2018-10-11 DIAGNOSIS — M1990 Unspecified osteoarthritis, unspecified site: Secondary | ICD-10-CM | POA: Insufficient documentation

## 2018-10-11 DIAGNOSIS — Z791 Long term (current) use of non-steroidal anti-inflammatories (NSAID): Secondary | ICD-10-CM | POA: Diagnosis not present

## 2018-10-11 DIAGNOSIS — R0609 Other forms of dyspnea: Secondary | ICD-10-CM | POA: Insufficient documentation

## 2018-10-11 DIAGNOSIS — E785 Hyperlipidemia, unspecified: Secondary | ICD-10-CM | POA: Insufficient documentation

## 2018-10-11 DIAGNOSIS — K219 Gastro-esophageal reflux disease without esophagitis: Secondary | ICD-10-CM | POA: Diagnosis not present

## 2018-10-11 DIAGNOSIS — Z7982 Long term (current) use of aspirin: Secondary | ICD-10-CM | POA: Insufficient documentation

## 2018-10-11 NOTE — Patient Instructions (Signed)
Patient Instructions  Patient Details  Name: Samantha Greene MRN: [id number] Date of Birth: [birth year] Referring Provider:  Lucky Cowboy, MD  Below are your personal goals for exercise, nutrition, and risk factors. Our goal is to help you stay on track towards obtaining and maintaining these goals. We will be discussing your progress on these goals with you throughout the program.  Initial Exercise Prescription: Initial Exercise Prescription - 10/11/18 1400      Date of Initial Exercise RX and Referring Provider   Date  10/11/18    Referring Provider  [place]      Treadmill   MPH  2.4    Grade  0.5    Minutes  15    METs  3      Recumbant Bike   Level  3    RPM  60    Watts  45    Minutes  15    METs  3      Elliptical   Level  1    Speed  3    Minutes  15      Biostep-RELP   Level  3    SPM  50    Minutes  15    METs  3      Prescription Details   Frequency (times per week)  3    Duration  Progress to 30 minutes of continuous aerobic without signs/symptoms of physical distress      Intensity   THRR 40-80% of Max Heartrate  119-154    Ratings of Perceived Exertion  11-15    Perceived Dyspnea  0-4      Resistance Training   Training Prescription  Yes    Weight  3 lb    Reps  10-15       Exercise Goals: Frequency: Be able to perform aerobic exercise two to three times per week in program working toward 2-5 days per week of home exercise.  Intensity: Work with a perceived exertion of 11 (fairly light) - 15 (hard) while following your exercise prescription.  We will make changes to your prescription with you as you progress through the program.   Duration: Be able to do 30 to 45 minutes of continuous aerobic exercise in addition to a 5 minute warm-up and a 5 minute cool-down routine.   Nutrition Goals: Your personal nutrition goals will be established when you do your nutrition analysis with the dietician.  The following are general nutrition  guidelines to follow: Cholesterol < 200mg /day Sodium < 1500mg /day Fiber: Women under 50 yrs - 25 grams per day  Personal Goals: Personal Goals and Risk Factors at Admission - 10/11/18 1457      Core Components/Risk Factors/Patient Goals on Admission    Weight Management  Yes;Weight Loss    Intervention  Weight Management: Develop a combined nutrition and exercise program designed to reach desired caloric intake, while maintaining appropriate intake of nutrient and fiber, sodium and fats, and appropriate energy expenditure required for the weight goal.;Weight Management: Provide education and appropriate resources to help participant work on and attain dietary goals.    Admit Weight  210 lb 1.6 oz (95.3 kg)    Goal Weight: Short Term  200 lb (90.7 kg)    Goal Weight: Long Term  190 lb (86.2 kg)    Expected Outcomes  Short Term: Continue to assess and modify interventions until short term weight is achieved;Long Term: Adherence to nutrition and physical activity/exercise program aimed toward attainment of  established weight goal    Intervention  Provide education on lifestyle modifcations including regular physical activity/exercise, weight management, moderate sodium restriction and increased consumption of fresh fruit, vegetables, and low fat dairy, alcohol moderation, and smoking cessation.;Monitor prescription use compliance.    Expected Outcomes  Short Term: Continued assessment and intervention until BP is < 140/6790mm HG in hypertensive participants. < 130/3080mm HG in hypertensive participants with diabetes, heart failure or chronic kidney disease.;Long Term: Maintenance of blood pressure at goal levels.    Intervention  Provide education and support for participant on nutrition & aerobic/resistive exercise along with prescribed medications to achieve LDL 70mg , HDL >40mg .    Expected Outcomes  Short Term: Participant states understanding of desired cholesterol values and is compliant with  medications prescribed. Participant is following exercise prescription and nutrition guidelines.;Long Term: Cholesterol controlled with medications as prescribed, with individualized exercise RX and with personalized nutrition plan. Value goals: LDL < 70mg , HDL > 40 mg.       Tobacco Use Initial Evaluation: Social History   Tobacco Use  Smoking Status Never Smoker  Smokeless Tobacco Never Used    Exercise Goals and Review: Exercise Goals    Row Name 10/11/18 1506             Exercise Goals   Increase Physical Activity  Yes       Intervention  Provide advice, education, support and counseling about physical activity/exercise needs.;Develop an individualized exercise prescription for aerobic and resistive training based on initial evaluation findings, risk stratification, comorbidities and participant's personal goals.       Expected Outcomes  Short Term: Attend rehab on a regular basis to increase amount of physical activity.;Long Term: Add in home exercise to make exercise part of routine and to increase amount of physical activity.;Long Term: Exercising regularly at least 3-5 days a week.       Increase Strength and Stamina  Yes       Intervention  Provide advice, education, support and counseling about physical activity/exercise needs.;Develop an individualized exercise prescription for aerobic and resistive training based on initial evaluation findings, risk stratification, comorbidities and participant's personal goals.       Expected Outcomes  Short Term: Increase workloads from initial exercise prescription for resistance, speed, and METs.;Short Term: Perform resistance training exercises routinely during rehab and add in resistance training at home;Long Term: Improve cardiorespiratory fitness, muscular endurance and strength as measured by increased METs and functional capacity (6MWT)       Able to understand and use rate of perceived exertion (RPE) scale  Yes       Intervention   Provide education and explanation on how to use RPE scale       Expected Outcomes  Short Term: Able to use RPE daily in rehab to express subjective intensity level;Long Term:  Able to use RPE to guide intensity level when exercising independently       Able to understand and use Dyspnea scale  Yes       Intervention  Provide education and explanation on how to use Dyspnea scale       Expected Outcomes  Short Term: Able to use Dyspnea scale daily in rehab to express subjective sense of shortness of breath during exertion;Long Term: Able to use Dyspnea scale to guide intensity level when exercising independently       Knowledge and understanding of Target Heart Rate Range (THRR)  Yes       Intervention  Provide education and explanation  of THRR including how the numbers were predicted and where they are located for reference       Expected Outcomes  Short Term: Able to state/look up THRR;Short Term: Able to use daily as guideline for intensity in rehab;Long Term: Able to use THRR to govern intensity when exercising independently       Able to check pulse independently  Yes       Intervention  Provide education and demonstration on how to check pulse in carotid and radial arteries.;Review the importance of being able to check your own pulse for safety during independent exercise       Expected Outcomes  Short Term: Able to explain why pulse checking is important during independent exercise;Long Term: Able to check pulse independently and accurately       Understanding of Exercise Prescription  Yes       Intervention  Provide education, explanation, and written materials on patient's individual exercise prescription       Expected Outcomes  Short Term: Able to explain program exercise prescription;Long Term: Able to explain home exercise prescription to exercise independently          Copy of goals given to participant.

## 2018-10-11 NOTE — Progress Notes (Signed)
Pulmonary Individual Treatment Plan  Patient Details  Name: Samantha Greene MRN: [id number] Date of Birth: Jun 11, 1970 Referring Provider:     Pulmonary Rehab from 10/11/2018 in [facility] Cardiac and Pulmonary Rehab  Referring Provider  McKeown      Initial Encounter Date:    Pulmonary Rehab from 10/11/2018 in [facility] Cardiac and Pulmonary Rehab  Date  10/11/18      Visit Diagnosis: DOE (dyspnea on exertion)  Patient's Home Medications on Admission:  Current Outpatient Medications:  .  acetaminophen (TYLENOL) 500 MG tablet, Take 2 tablets (1,000 mg total) by mouth every 6 (six) hours as needed., Disp: 30 tablet, Rfl: 0 .  albuterol (VENTOLIN HFA) 108 (90 Base) MCG/ACT inhaler, Inhale 2 puffs into the lungs every 4 (four) hours as needed for wheezing or shortness of breath., Disp: 1 Inhaler, Rfl: 0 .  aspirin 81 MG tablet, Take 81 mg by mouth daily., Disp: , Rfl:  .  baclofen (LIORESAL) 10 MG tablet, Take 1/2 to 1 tablet 1 or 2 x /day as needed for Muscle Spasms, Disp: 180 tablet, Rfl: 1 .  buPROPion (WELLBUTRIN XL) 300 MG 24 hr tablet, Take 1 tablet every Morning for Mood, Focus & Concentration, Disp: 90 tablet, Rfl: 3 .  cetirizine (ZYRTEC) 10 MG tablet, Take 10 mg by mouth at bedtime., Disp: , Rfl:  .  Cholecalciferol (VITAMIN D) 125 MCG (5000 UT) CAPS, Take 5,000 Units by mouth daily. , Disp: , Rfl:  .  escitalopram (LEXAPRO) 20 MG tablet, TAKE 1 TABLET BY MOUTH EVERY DAY, Disp: 90 tablet, Rfl: 1 .  Eszopiclone 3 MG TABS, TAKE 1 TABLET BY MOUTH AT BEDTIME AS NEEDED. TAKE IMMEDIATELY BEFORE BEDTIME, Disp: 30 tablet, Rfl: 3 .  fluocinolone (SYNALAR) 0.025 % ointment, APPLY TO AFFECTED AREA TWICE A DAY, Disp: 30 g, Rfl: 0 .  fluticasone (FLONASE) 50 MCG/ACT nasal spray, Place 2 sprays into both nostrils daily., Disp: 16 g, Rfl: 2 .  gabapentin (NEURONTIN) 300 MG capsule, Take 1 capsule (300 mg total) by mouth 3 (three) times daily. (Patient taking differently: Take 300 mg by mouth 3 (three)  times daily. Patient taking BID), Disp: 90 capsule, Rfl: 2 .  ibuprofen (ADVIL,MOTRIN) 800 MG tablet, Take 1 tablet (800 mg total) by mouth every 8 (eight) hours as needed (breakthrough pain)., Disp: 30 tablet, Rfl: 0 .  meloxicam (MOBIC) 15 MG tablet, Take 1/2 to 1 tablet Daily for Pain & Inflammation & limit to 4-5 tablets  /week to Avoid Kidney Damage, Disp: 90 tablet, Rfl: 0 .  montelukast (SINGULAIR) 10 MG tablet, Take 1 tablet (10 mg total) by mouth at bedtime. Take 1 tablet daily for Allergies, Disp: 90 tablet, Rfl: 3 .  nystatin cream (MYCOSTATIN), Apply 1 application topically 2 (two) times daily. (Patient taking differently: Apply 1 application topically 2 (two) times daily as needed for dry skin. ), Disp: 30 g, Rfl: 1 .  omeprazole (PRILOSEC) 40 MG capsule, TAKE 1 CAPSULE BY MOUTH EVERY DAY, Disp: 90 capsule, Rfl: 0 .  rosuvastatin (CRESTOR) 5 MG tablet, Take 1 tablet (5 mg total) by mouth at bedtime., Disp: 90 tablet, Rfl: 3 .  SPRINTEC 28 0.25-35 MG-MCG tablet, TAKE 1 TABLET BY MOUTH EVERY DAY, Disp: 84 tablet, Rfl: 0 .  telmisartan (MICARDIS) 40 MG tablet, Take 1 tablet Daily for BP, Disp: 90 tablet, Rfl: 1 .  triamcinolone ointment (KENALOG) 0.1 %, Apply 1 application topically 2 (two) times daily. (Patient taking differently: Apply 1 application topically daily as  needed (rash). ), Disp: 80 g, Rfl: 1 .  vitamin C (ASCORBIC ACID) 500 MG tablet, Take 500 mg by mouth daily., Disp: , Rfl:  .  vitamin E 600 UNIT capsule, Take 600 Units by mouth daily., Disp: , Rfl:   Past Medical History: Past Medical History:  Diagnosis Date  . Anxiety   . Arthritis   . Depression    no meds  . Dyspnea    can happen anytime  . Family history of breast cancer   . Family history of colon cancer   . Family history of lung cancer   . Family history of pancreatic cancer   . Family history of prostate cancer   . GERD (gastroesophageal reflux disease)   . Hernia, diaphragmatic   . History of kidney  stones   . Hyperlipidemia   . Hypertension    no meds  . Pneumonia    1 time    Tobacco Use: Social History   Tobacco Use  Smoking Status Never Smoker  Smokeless Tobacco Never Used    Labs: Recent Review Flowsheet Data    Labs for ITP Cardiac and Pulmonary Rehab Latest Ref Rng & Units 10/12/2017 03/08/2018 03/21/2018 03/24/2018 09/11/2018   Cholestrol <200 mg/dL 241(H) 210(H) - - 226(H)   LDLCALC mg/dL (calc) 118(H) 108(H) - - 132(H)   HDL > OR = 50 mg/dL 89 76 - - 62   Trlycerides <150 mg/dL 216(H) 150(H) - - 188(H)   Hemoglobin A1c <5.7 % of total Hgb 5.3 - - - 5.6   PHART 7.350 - 7.450 - - 7.427 7.361 -   PCO2ART 32.0 - 48.0 mmHg - - 35.9 45.9 -   HCO3 20.0 - 28.0 mmol/L - - 23.2 25.4 -   ACIDBASEDEF 0.0 - 2.0 mmol/L - - 0.6 - -   O2SAT % - - 96.8 96.7 -       Pulmonary Assessment Scores: Pulmonary Assessment Scores    Row Name 10/11/18 1459         ADL UCSD   Rest  1     Walk  3     Stairs  5     Bath  1     Dress  2     Shop  3       CAT Score   CAT Score  25       mMRC Score   mMRC Score  2        UCSD: Self-administered rating of dyspnea associated with activities of daily living (ADLs) 6-point scale (0 = "not at all" to 5 = "maximal or unable to do because of breathlessness")  Scoring Scores range from 0 to 120.  Minimally important difference is 5 units  CAT: CAT can identify the health impairment of COPD patients and is better correlated with disease progression.  CAT has a scoring range of zero to 40. The CAT score is classified into four groups of low (less than 10), medium (10 - 20), high (21-30) and very high (31-40) based on the impact level of disease on health status. A CAT score over 10 suggests significant symptoms.  A worsening CAT score could be explained by an exacerbation, poor medication adherence, poor inhaler technique, or progression of COPD or comorbid conditions.  CAT MCID is 2 points  mMRC: mMRC (Modified Medical Research  Council) Dyspnea Scale is used to assess the degree of baseline functional disability in patients of respiratory disease due to dyspnea. No minimal important  difference is established. A decrease in score of 1 point or greater is considered a positive change.   Pulmonary Function Assessment:   Exercise Target Goals: Exercise Program Goal: Individual exercise prescription set using results from initial 6 min walk test and THRR while considering  patient's activity barriers and safety.   Exercise Prescription Goal: Initial exercise prescription builds to 30-45 minutes a day of aerobic activity, 2-3 days per week.  Home exercise guidelines will be given to patient during program as part of exercise prescription that the participant will acknowledge.  Activity Barriers & Risk Stratification: Activity Barriers & Cardiac Risk Stratification - 09/24/18 1735      Activity Barriers & Cardiac Risk Stratification   Activity Barriers  Other (comment)    Comments  weak ankles  panter facitis, muscle spasm neck and back       6 Minute Walk: 6 Minute Walk    Row Name 10/11/18 1445         6 Minute Walk   Phase  Initial     Distance  1300 feet     Walk Time  6 minutes     # of Rest Breaks  0     MPH  2.46     METS  3.48     RPE  13     Perceived Dyspnea   3     VO2 Peak  12.2     Symptoms  No     Resting HR  84 bpm     Resting BP  122/74     Resting Oxygen Saturation   98 %     Exercise Oxygen Saturation  during 6 min walk  90 %     Max Ex. HR  107 bpm     Max Ex. BP  138/84     2 Minute Post BP  126/78       Interval HR   1 Minute HR  92     2 Minute HR  103     3 Minute HR  106     4 Minute HR  107     5 Minute HR  103     6 Minute HR  103     2 Minute Post HR  88     Interval Heart Rate?  Yes       Interval Oxygen   Interval Oxygen?  Yes     Baseline Oxygen Saturation %  98 %     1 Minute Oxygen Saturation %  91 %     1 Minute Liters of Oxygen  0 L     2 Minute Oxygen  Saturation %  90 %     2 Minute Liters of Oxygen  0 L     3 Minute Oxygen Saturation %  91 %     3 Minute Liters of Oxygen  0 L     4 Minute Oxygen Saturation %  93 %     4 Minute Liters of Oxygen  0 L     5 Minute Oxygen Saturation %  92 %     5 Minute Liters of Oxygen  0 L     6 Minute Oxygen Saturation %  93 %     6 Minute Liters of Oxygen  0 L     2 Minute Post Oxygen Saturation %  98 %     2 Minute Post Liters of Oxygen  0 L  Oxygen Initial Assessment:   Oxygen Re-Evaluation:   Oxygen Discharge (Final Oxygen Re-Evaluation):   Initial Exercise Prescription: Initial Exercise Prescription - 10/11/18 1400      Date of Initial Exercise RX and Referring Provider   Date  10/11/18    Referring Provider  McKeown      Treadmill   MPH  2.4    Grade  0.5    Minutes  15    METs  3      Recumbant Bike   Level  3    RPM  60    Watts  45    Minutes  15    METs  3      Elliptical   Level  1    Speed  3    Minutes  15      Biostep-RELP   Level  3    SPM  50    Minutes  15    METs  3      Prescription Details   Frequency (times per week)  3    Duration  Progress to 30 minutes of continuous aerobic without signs/symptoms of physical distress      Intensity   THRR 40-80% of Max Heartrate  119-154    Ratings of Perceived Exertion  11-15    Perceived Dyspnea  0-4      Resistance Training   Training Prescription  Yes    Weight  3 lb    Reps  10-15       Perform Capillary Blood Glucose checks as needed.  Exercise Prescription Changes: Exercise Prescription Changes    Row Name 10/11/18 1400             Response to Exercise   Blood Pressure (Admit)  122/74       Blood Pressure (Exercise)  138/84       Blood Pressure (Exit)  126/78       Heart Rate (Admit)  84 bpm       Heart Rate (Exercise)  107 bpm       Heart Rate (Exit)  88 bpm       Oxygen Saturation (Admit)  98 %       Oxygen Saturation (Exercise)  90 %       Oxygen Saturation (Exit)  98 %        Rating of Perceived Exertion (Exercise)  13       Perceived Dyspnea (Exercise)  3       Symptoms  none       Comments  orientation          Exercise Comments:   Exercise Goals and Review:   Exercise Goals Re-Evaluation :   Discharge Exercise Prescription (Final Exercise Prescription Changes): Exercise Prescription Changes - 10/11/18 1400      Response to Exercise   Blood Pressure (Admit)  122/74    Blood Pressure (Exercise)  138/84    Blood Pressure (Exit)  126/78    Heart Rate (Admit)  84 bpm    Heart Rate (Exercise)  107 bpm    Heart Rate (Exit)  88 bpm    Oxygen Saturation (Admit)  98 %    Oxygen Saturation (Exercise)  90 %    Oxygen Saturation (Exit)  98 %    Rating of Perceived Exertion (Exercise)  13    Perceived Dyspnea (Exercise)  3    Symptoms  none    Comments  orientation  Nutrition:  Target Goals: Understanding of nutrition guidelines, daily intake of sodium <155m, cholesterol <2082m calories 30% from fat and 7% or less from saturated fats, daily to have 5 or more servings of fruits and vegetables.  Biometrics: Pre Biometrics - 10/11/18 1453      Pre Biometrics   Height  5' 0.5" (1.537 m)    Weight  210 lb 1.6 oz (95.3 kg)    BMI (Calculated)  40.34    Single Leg Stand  10.6 seconds        Nutrition Therapy Plan and Nutrition Goals: Nutrition Therapy & Goals - 10/11/18 1436      Nutrition Therapy   Diet  Low Na, HH diet    Protein (specify units)  75-80g    Fiber  25 grams    Whole Grain Foods  3 servings    Saturated Fats  12 max. grams    Fruits and Vegetables  5 servings/day    Sodium  1.5 grams      Personal Nutrition Goals   Nutrition Goal  ST: Pt wants to try hello fresh LT: Weight loss 50lb (5-10lbs now)    Comments  Pt reports not eating that much during the day. B: cheerios with 2% milk. L/D: (2 meals per day) usually [employer] or another kind of take out. For a snack sometimes she will have airpopped popcorn with  her own seasoning. pt reports not cooking and having difficulty with motivation, pt reports hx of depression. Discussed HH eating, mindful eating/intuitive eating, food anthropologist (no judgement), weight loss expectations, and about her support system.      Intervention Plan   Intervention  Prescribe, educate and counsel regarding individualized specific dietary modifications aiming towards targeted core components such as weight, hypertension, lipid management, diabetes, heart failure and other comorbidities.;Nutrition handout(s) given to patient.    Expected Outcomes  Short Term Goal: Understand basic principles of dietary content, such as calories, fat, sodium, cholesterol and nutrients.;Short Term Goal: A plan has been developed with personal nutrition goals set during dietitian appointment.;Long Term Goal: Adherence to prescribed nutrition plan.       Nutrition Assessments: Nutrition Assessments - 10/11/18 1459      MEDFICTS Scores   Pre Score  16       Nutrition Goals Re-Evaluation:   Nutrition Goals Discharge (Final Nutrition Goals Re-Evaluation):   Psychosocial: Target Goals: Acknowledge presence or absence of significant depression and/or stress, maximize coping skills, provide positive support system. Participant is able to verbalize types and ability to use techniques and skills needed for reducing stress and depression.   Initial Review & Psychosocial Screening: Initial Psych Review & Screening - 09/24/18 1737      Initial Review   Current issues with  Current Psychotropic Meds;Current Sleep Concerns;Current Depression;Current Stress Concerns    Comments  Stress is part of her nature      Family Dynamics   Good Support System?  Yes   Good friends,Husband, Twin Brother , Aunt     Barriers   Psychosocial barriers to participate in program  There are no identifiable barriers or psychosocial needs.;The patient should benefit from training in stress management and  relaxation.      Screening Interventions   Interventions  Encouraged to exercise;To provide support and resources with identified psychosocial needs;Provide feedback about the scores to participant    Expected Outcomes  Short Term goal: Utilizing psychosocial counselor, staff and physician to assist with identification of specific Stressors or current issues  interfering with healing process. Setting desired goal for each stressor or current issue identified.;Long Term Goal: Stressors or current issues are controlled or eliminated.;Short Term goal: Identification and review with participant of any Quality of Life or Depression concerns found by scoring the questionnaire.;Long Term goal: The participant improves quality of Life and PHQ9 Scores as seen by post scores and/or verbalization of changes       Quality of Life Scores: Quality of Life - 10/11/18 1458      Quality of Life   Select  Quality of Life      Scores of 19 and below usually indicate a poorer quality of life in these areas.  A difference of  2-3 points is a clinically meaningful difference.  A difference of 2-3 points in the total score of the Quality of Life Index has been associated with significant improvement in overall quality of life, self-image, physical symptoms, and general health in studies assessing change in quality of life.  PHQ-9: Recent Review Flowsheet Data    There is no flowsheet data to display.     Interpretation of Total Score  Total Score Depression Severity:  1-4 = Minimal depression, 5-9 = Mild depression, 10-14 = Moderate depression, 15-19 = Moderately severe depression, 20-27 = Severe depression   Psychosocial Evaluation and Intervention: Psychosocial Evaluation - 09/24/18 1754      Psychosocial Evaluation & Interventions   Interventions  Encouraged to exercise with the program and follow exercise prescription    Comments  Zayna has no barriers to exercise and ttending the program. She wants to  gain stamina and continue her weight loss journey. She had a large support system with her husband, friends and family members. She states that she does not have any particular stressor, yet she does Get stressed."It's my nature". She is being treated with medication for Depression by her PMD. HAs not attended any counseling session. Was advised that she could ask her PMD to refer her if needed. She is ready to start the program.    Expected Outcomes  STG: Earleen is able to attend all sessions and work on her goals for increase stamina, less SOB and weight loss. LTG: She is able to [facility] her goals.    Continue Psychosocial Services   Follow up required by staff       Psychosocial Re-Evaluation:   Psychosocial Discharge (Final Psychosocial Re-Evaluation):   Education: Education Goals: Education classes will be provided on a weekly basis, covering required topics. Participant will state understanding/return demonstration of topics presented.  Learning Barriers/Preferences: Learning Barriers/Preferences - 09/24/18 1740      Learning Barriers/Preferences   Learning Barriers  Hearing   ADD,  Words not always trasmit correctly when hearing or speaking   Learning Preferences  None       Education Topics:  Initial Evaluation Education: - Verbal, written and demonstration of respiratory meds, oximetry and breathing techniques. Instruction on use of nebulizers and MDIs and importance of monitoring MDI activations.   General Nutrition Guidelines/Fats and Fiber: -Group instruction provided by verbal, written material, models and posters to present the general guidelines for heart healthy nutrition. Gives an explanation and review of dietary fats and fiber.   Controlling Sodium/Reading Food Labels: -Group verbal and written material supporting the discussion of sodium use in heart healthy nutrition. Review and explanation with models, verbal and written materials for utilization of the  food label.   Exercise Physiology & General Exercise Guidelines: - Group verbal and written instruction with  models to review the exercise physiology of the cardiovascular system and associated critical values. Provides general exercise guidelines with specific guidelines to those with heart or lung disease.    Aerobic Exercise & Resistance Training: - Gives group verbal and written instruction on the various components of exercise. Focuses on aerobic and resistive training programs and the benefits of this training and how to safely progress through these programs.   Flexibility, Balance, Mind/Body Relaxation: Provides group verbal/written instruction on the benefits of flexibility and balance training, including mind/body exercise modes such as yoga, pilates and tai chi.  Demonstration and skill practice provided.   Stress and Anxiety: - Provides group verbal and written instruction about the health risks of elevated stress and causes of high stress.  Discuss the correlation between heart/lung disease and anxiety and treatment options. Review healthy ways to manage with stress and anxiety.   Depression: - Provides group verbal and written instruction on the correlation between heart/lung disease and depressed mood, treatment options, and the stigmas associated with seeking treatment.   Exercise & Equipment Safety: - Individual verbal instruction and demonstration of equipment use and safety with use of the equipment.   Pulmonary Rehab from 10/11/2018 in [facility] Cardiac and Pulmonary Rehab  Date  10/11/18  Educator  AS  Instruction Review Code  1- Verbalizes Understanding      Infection Prevention: - Provides verbal and written material to individual with discussion of infection control including proper hand washing and proper equipment cleaning during exercise session.   Pulmonary Rehab from 10/11/2018 in [facility] Cardiac and Pulmonary Rehab  Date  10/11/18  Educator  AS  Instruction  Review Code  1- Verbalizes Understanding      Falls Prevention: - Provides verbal and written material to individual with discussion of falls prevention and safety.   Pulmonary Rehab from 10/11/2018 in [facility] Cardiac and Pulmonary Rehab  Date  10/11/18  Educator  AS  Instruction Review Code  1- Verbalizes Understanding      Diabetes: - Individual verbal and written instruction to review signs/symptoms of diabetes, desired ranges of glucose level fasting, after meals and with exercise. Advice that pre and post exercise glucose checks will be done for 3 sessions at entry of program.   Chronic Lung Diseases: - Group verbal and written instruction to review updates, respiratory medications, advancements in procedures and treatments. Discuss use of supplemental oxygen including available portable oxygen systems, continuous and intermittent flow rates, concentrators, personal use and safety guidelines. Review proper use of inhaler and spacers. Provide informative websites for self-education.    Energy Conservation: - Provide group verbal and written instruction for methods to conserve energy, plan and organize activities. Instruct on pacing techniques, use of adaptive equipment and posture/positioning to relieve shortness of breath.   Triggers and Exacerbations: - Group verbal and written instruction to review types of environmental triggers and ways to prevent exacerbations. Discuss weather changes, air quality and the benefits of nasal washing. Review warning signs and symptoms to help prevent infections. Discuss techniques for effective airway clearance, coughing, and vibrations.   AED/CPR: - Group verbal and written instruction with the use of models to demonstrate the basic use of the AED with the basic ABC's of resuscitation.   Anatomy and Physiology of the Lungs: - Group verbal and written instruction with the use of models to provide basic lung anatomy and physiology related to  function, structure and complications of lung disease.   Anatomy & Physiology of the Heart: - Group verbal  and written instruction and models provide basic cardiac anatomy and physiology, with the coronary electrical and arterial systems. Review of Valvular disease and Heart Failure   Cardiac Medications: - Group verbal and written instruction to review commonly prescribed medications for heart disease. Reviews the medication, class of the drug, and side effects.   Know Your Numbers and Risk Factors: -Group verbal and written instruction about important numbers in your health.  Discussion of what are risk factors and how they play a role in the disease process.  Review of Cholesterol, Blood Pressure, Diabetes, and BMI and the role they play in your overall health.   Sleep Hygiene: -Provides group verbal and written instruction about how sleep can affect your health.  Define sleep hygiene, discuss sleep cycles and impact of sleep habits. Review good sleep hygiene tips.    Other: -Provides group and verbal instruction on various topics (see comments)    Knowledge Questionnaire Score: Knowledge Questionnaire Score - 10/11/18 1501      Knowledge Questionnaire Score   Pre Score  14/18        Core Components/Risk Factors/Patient Goals at Admission: Personal Goals and Risk Factors at Admission - 10/11/18 1457      Core Components/Risk Factors/Patient Goals on Admission    Weight Management  Yes;Weight Loss    Intervention  Weight Management: Develop a combined nutrition and exercise program designed to reach desired caloric intake, while maintaining appropriate intake of nutrient and fiber, sodium and fats, and appropriate energy expenditure required for the weight goal.;Weight Management: Provide education and appropriate resources to help participant work on and attain dietary goals.    Admit Weight  210 lb 1.6 oz (95.3 kg)    Goal Weight: Short Term  200 lb (90.7 kg)    Goal  Weight: Long Term  190 lb (86.2 kg)    Expected Outcomes  Short Term: Continue to assess and modify interventions until short term weight is achieved;Long Term: Adherence to nutrition and physical activity/exercise program aimed toward attainment of established weight goal    Intervention  Provide education on lifestyle modifcations including regular physical activity/exercise, weight management, moderate sodium restriction and increased consumption of fresh fruit, vegetables, and low fat dairy, alcohol moderation, and smoking cessation.;Monitor prescription use compliance.    Expected Outcomes  Short Term: Continued assessment and intervention until BP is < 140/12m HG in hypertensive participants. < 130/842mHG in hypertensive participants with diabetes, heart failure or chronic kidney disease.;Long Term: Maintenance of blood pressure at goal levels.    Intervention  Provide education and support for participant on nutrition & aerobic/resistive exercise along with prescribed medications to achieve LDL <7041mHDL >76m35m  Expected Outcomes  Short Term: Participant states understanding of desired cholesterol values and is compliant with medications prescribed. Participant is following exercise prescription and nutrition guidelines.;Long Term: Cholesterol controlled with medications as prescribed, with individualized exercise RX and with personalized nutrition plan. Value goals: LDL < 70mg65mL > 40 mg.       Core Components/Risk Factors/Patient Goals Review:    Core Components/Risk Factors/Patient Goals at Discharge (Final Review):    ITP Comments: ITP Comments    Row Name 09/24/18 1751 10/11/18 1444         ITP Comments  Virtual Orientation completed today for Pulmonary Rehab. EP/RD appt on 8/26  Documentation for diagnosis can be found in [facility] and 8/11  Initial 6MWT and nutrition consult completed. ITp created and sent to Dr [place] and  sign.         Comments: initial ITP

## 2018-10-17 ENCOUNTER — Other Ambulatory Visit: Payer: Self-pay

## 2018-10-17 ENCOUNTER — Encounter: Payer: BC Managed Care – PPO | Admitting: *Deleted

## 2018-10-17 DIAGNOSIS — Z801 Family history of malignant neoplasm of trachea, bronchus and lung: Secondary | ICD-10-CM | POA: Diagnosis not present

## 2018-10-17 DIAGNOSIS — Z803 Family history of malignant neoplasm of breast: Secondary | ICD-10-CM | POA: Diagnosis not present

## 2018-10-17 DIAGNOSIS — R0609 Other forms of dyspnea: Secondary | ICD-10-CM | POA: Diagnosis not present

## 2018-10-17 DIAGNOSIS — F419 Anxiety disorder, unspecified: Secondary | ICD-10-CM | POA: Diagnosis not present

## 2018-10-17 DIAGNOSIS — M1990 Unspecified osteoarthritis, unspecified site: Secondary | ICD-10-CM | POA: Diagnosis not present

## 2018-10-17 DIAGNOSIS — Z79899 Other long term (current) drug therapy: Secondary | ICD-10-CM | POA: Diagnosis not present

## 2018-10-17 DIAGNOSIS — E785 Hyperlipidemia, unspecified: Secondary | ICD-10-CM | POA: Diagnosis not present

## 2018-10-17 DIAGNOSIS — F329 Major depressive disorder, single episode, unspecified: Secondary | ICD-10-CM | POA: Diagnosis not present

## 2018-10-17 DIAGNOSIS — Z8042 Family history of malignant neoplasm of prostate: Secondary | ICD-10-CM | POA: Diagnosis not present

## 2018-10-17 DIAGNOSIS — K219 Gastro-esophageal reflux disease without esophagitis: Secondary | ICD-10-CM | POA: Diagnosis not present

## 2018-10-17 DIAGNOSIS — Z791 Long term (current) use of non-steroidal anti-inflammatories (NSAID): Secondary | ICD-10-CM | POA: Diagnosis not present

## 2018-10-17 DIAGNOSIS — Z7982 Long term (current) use of aspirin: Secondary | ICD-10-CM | POA: Diagnosis not present

## 2018-10-17 DIAGNOSIS — Z800 Family history of malignant neoplasm of digestive organs: Secondary | ICD-10-CM | POA: Diagnosis not present

## 2018-10-17 NOTE — Progress Notes (Signed)
Counselor contacted patient per staff recommendation. Patient reports a history of depression.  Feels well supported by primary care provider A. Collier--currently prescribed Welbutrin or Lexipro, does not have an Secretary/administrator, would be open to such a referral as needed.  Information provided for how to access counseling services.  Patient reports that spouse is supportive.  Patiient shared thoughts and feelings about events leading up to surgery last October, how quickly things moved, experience of neuropathy.  Patient open to ongoing emotional support, welcomes phone calls.

## 2018-10-17 NOTE — Progress Notes (Signed)
Daily Session Note  Patient Details  Name: Samantha Greene MRN: [id number] Date of Birth: 02-May-1970 Referring Provider:     Pulmonary Rehab from 10/11/2018 in [facility] Cardiac and Pulmonary Rehab  Referring Provider  [place]      Encounter Date: 10/17/2018  Check In: Session Check In - 10/17/18 1421      Check-In   Supervising physician immediately available to respond to emergencies  See telemetry face sheet for immediately available ER MD    Location  [facility]-Cardiac & Pulmonary Rehab    Staff Present  [name], RN Vickki Hearing, BA, ACSM CEP, Exercise Physiologist;Joseph Tessie Fass RCP,RRT,BSRT    Virtual Visit  No    Medication changes reported      No    Fall or balance concerns reported     No    Warm-up and Cool-down  Performed on first and last piece of equipment    Resistance Training Performed  Yes    VAD Patient?  No    PAD/SET Patient?  No      Pain Assessment   Currently in Pain?  No/denies          Social History   Tobacco Use  Smoking Status Never Smoker  Smokeless Tobacco Never Used    Goals Met:  Proper associated with RPD/PD & O2 Sat Independence with exercise equipment Using PLB without cueing & demonstrates good technique Exercise tolerated well No report of cardiac concerns or symptoms Strength training completed today  Goals Unmet:  Not Applicable  Comments: First full day of exercise!  Patient was oriented to gym and equipment including functions, settings, policies, and procedures.  Patient's individual exercise prescription and treatment plan were reviewed.  All starting workloads were established based on the results of the 6 minute walk test done at initial orientation visit.  The plan for exercise progression was also introduced and progression will be customized based on patient's performance and goals.    Dr. Emily Filbert is Medical Director for [place] and [facility].

## 2018-10-18 ENCOUNTER — Telehealth: Payer: Self-pay

## 2018-10-18 ENCOUNTER — Encounter: Payer: BC Managed Care – PPO | Admitting: *Deleted

## 2018-10-18 ENCOUNTER — Other Ambulatory Visit: Payer: Self-pay | Admitting: Neurology

## 2018-10-18 DIAGNOSIS — R0602 Shortness of breath: Secondary | ICD-10-CM

## 2018-10-18 DIAGNOSIS — F5104 Psychophysiologic insomnia: Secondary | ICD-10-CM

## 2018-10-18 DIAGNOSIS — Z8042 Family history of malignant neoplasm of prostate: Secondary | ICD-10-CM | POA: Diagnosis not present

## 2018-10-18 DIAGNOSIS — R0609 Other forms of dyspnea: Secondary | ICD-10-CM

## 2018-10-18 DIAGNOSIS — K219 Gastro-esophageal reflux disease without esophagitis: Secondary | ICD-10-CM | POA: Diagnosis not present

## 2018-10-18 DIAGNOSIS — Z79899 Other long term (current) drug therapy: Secondary | ICD-10-CM | POA: Diagnosis not present

## 2018-10-18 DIAGNOSIS — F419 Anxiety disorder, unspecified: Secondary | ICD-10-CM | POA: Diagnosis not present

## 2018-10-18 DIAGNOSIS — K440 Diaphragmatic hernia with obstruction, without gangrene: Secondary | ICD-10-CM

## 2018-10-18 DIAGNOSIS — F329 Major depressive disorder, single episode, unspecified: Secondary | ICD-10-CM | POA: Diagnosis not present

## 2018-10-18 DIAGNOSIS — Z7982 Long term (current) use of aspirin: Secondary | ICD-10-CM | POA: Diagnosis not present

## 2018-10-18 DIAGNOSIS — E785 Hyperlipidemia, unspecified: Secondary | ICD-10-CM | POA: Diagnosis not present

## 2018-10-18 DIAGNOSIS — R0683 Snoring: Secondary | ICD-10-CM

## 2018-10-18 DIAGNOSIS — M1990 Unspecified osteoarthritis, unspecified site: Secondary | ICD-10-CM | POA: Diagnosis not present

## 2018-10-18 DIAGNOSIS — Z800 Family history of malignant neoplasm of digestive organs: Secondary | ICD-10-CM | POA: Diagnosis not present

## 2018-10-18 DIAGNOSIS — Z791 Long term (current) use of non-steroidal anti-inflammatories (NSAID): Secondary | ICD-10-CM | POA: Diagnosis not present

## 2018-10-18 DIAGNOSIS — Z803 Family history of malignant neoplasm of breast: Secondary | ICD-10-CM | POA: Diagnosis not present

## 2018-10-18 DIAGNOSIS — Z801 Family history of malignant neoplasm of trachea, bronchus and lung: Secondary | ICD-10-CM | POA: Diagnosis not present

## 2018-10-18 NOTE — Progress Notes (Signed)
Daily Session Note  Patient Details  Name: Samantha Greene MRN: [id number] Date of Birth: 12/02/1970 Referring Provider:     Pulmonary Rehab from 10/11/2018 in [facility] Cardiac and Pulmonary Rehab  Referring Provider  [place]      Encounter Date: 10/18/2018  Check In: Session Check In - 10/18/18 1426      Check-In   Supervising physician immediately available to respond to emergencies  See telemetry face sheet for immediately available ER MD    Location  [facility]-Cardiac & Pulmonary Rehab    Staff Present  [name], [name];Jessica Luan Pulling, MA, RCEP, CCRP, CCET;Joseph Breaks RCP,RRT,BSRT    Virtual Visit  No    Medication changes reported      No    Fall or balance concerns reported     No    Warm-up and Cool-down  Performed on first and last piece of equipment    Resistance Training Performed  Yes    VAD Patient?  No    PAD/SET Patient?  No      Pain Assessment   Currently in Pain?  No/denies          Social History   Tobacco Use  Smoking Status Never Smoker  Smokeless Tobacco Never Used    Goals Met:  Proper associated with RPD/PD & O2 Sat Independence with exercise equipment Using PLB without cueing & demonstrates good technique Exercise tolerated well No report of cardiac concerns or symptoms Strength training completed today  Goals Unmet:  Not Applicable  Comments: Pt able to follow exercise prescription today without complaint.  Will continue to monitor for progression.    Dr. Emily Filbert is Medical Director for [place] and [facility].

## 2018-10-18 NOTE — Telephone Encounter (Signed)
Order placed for the patient.

## 2018-10-18 NOTE — Telephone Encounter (Signed)
BCBS anthem denied in lab sleep study. Will need HST order to rule out sleep apnea then we can get auth for RBD

## 2018-10-18 NOTE — Telephone Encounter (Signed)
Samantha Greene from [place] called wanting to give a status update. She states that the code 95810 is not approved. It does not meet the AIM sleepstudy specialty health guidelines. Please call if there are any questions to [phone]

## 2018-10-19 ENCOUNTER — Ambulatory Visit: Payer: BC Managed Care – PPO

## 2018-10-22 ENCOUNTER — Other Ambulatory Visit: Payer: Self-pay

## 2018-10-22 ENCOUNTER — Encounter: Payer: BC Managed Care – PPO | Admitting: *Deleted

## 2018-10-22 DIAGNOSIS — Z791 Long term (current) use of non-steroidal anti-inflammatories (NSAID): Secondary | ICD-10-CM | POA: Diagnosis not present

## 2018-10-22 DIAGNOSIS — F419 Anxiety disorder, unspecified: Secondary | ICD-10-CM | POA: Diagnosis not present

## 2018-10-22 DIAGNOSIS — Z801 Family history of malignant neoplasm of trachea, bronchus and lung: Secondary | ICD-10-CM | POA: Diagnosis not present

## 2018-10-22 DIAGNOSIS — F329 Major depressive disorder, single episode, unspecified: Secondary | ICD-10-CM | POA: Diagnosis not present

## 2018-10-22 DIAGNOSIS — R0609 Other forms of dyspnea: Secondary | ICD-10-CM

## 2018-10-22 DIAGNOSIS — K219 Gastro-esophageal reflux disease without esophagitis: Secondary | ICD-10-CM | POA: Diagnosis not present

## 2018-10-22 DIAGNOSIS — M1990 Unspecified osteoarthritis, unspecified site: Secondary | ICD-10-CM | POA: Diagnosis not present

## 2018-10-22 DIAGNOSIS — Z800 Family history of malignant neoplasm of digestive organs: Secondary | ICD-10-CM | POA: Diagnosis not present

## 2018-10-22 DIAGNOSIS — Z803 Family history of malignant neoplasm of breast: Secondary | ICD-10-CM | POA: Diagnosis not present

## 2018-10-22 DIAGNOSIS — Z8042 Family history of malignant neoplasm of prostate: Secondary | ICD-10-CM | POA: Diagnosis not present

## 2018-10-22 DIAGNOSIS — E785 Hyperlipidemia, unspecified: Secondary | ICD-10-CM | POA: Diagnosis not present

## 2018-10-22 DIAGNOSIS — Z7982 Long term (current) use of aspirin: Secondary | ICD-10-CM | POA: Diagnosis not present

## 2018-10-22 DIAGNOSIS — Z79899 Other long term (current) drug therapy: Secondary | ICD-10-CM | POA: Diagnosis not present

## 2018-10-22 NOTE — Progress Notes (Signed)
Daily Session Note  Patient Details  Name: Samantha Greene MRN: [id number] Date of Birth: 1970/07/28 Referring Provider:     Pulmonary Rehab from 10/11/2018 in [facility] Cardiac and Pulmonary Rehab  Referring Provider  [place]      Encounter Date: 10/22/2018  Check In: Session Check In - 10/22/18 1417      Check-In   Supervising physician immediately available to respond to emergencies  See telemetry face sheet for immediately available ER MD    Location  [facility]-Cardiac & Pulmonary Rehab    Staff Present  [name], RN Moises Blood, BS, ACSM CEP, Exercise Physiologist;Joseph Tessie Fass RCP,RRT,BSRT    Virtual Visit  No    Medication changes reported      No    Fall or balance concerns reported     No    Warm-up and Cool-down  Performed on first and last piece of equipment    Resistance Training Performed  Yes    VAD Patient?  No    PAD/SET Patient?  No      Pain Assessment   Currently in Pain?  No/denies          Social History   Tobacco Use  Smoking Status Never Smoker  Smokeless Tobacco Never Used    Goals Met:  Independence with exercise equipment Exercise tolerated well No report of cardiac concerns or symptoms Strength training completed today  Goals Unmet:  Not Applicable  Comments: Pt able to follow exercise prescription today without complaint.  Will continue to monitor for progression.    Dr. Emily Filbert is Medical Director for [place] and [facility].

## 2018-10-24 ENCOUNTER — Ambulatory Visit: Payer: BC Managed Care – PPO

## 2018-10-24 ENCOUNTER — Telehealth: Payer: Self-pay | Admitting: *Deleted

## 2018-10-24 NOTE — Telephone Encounter (Signed)
Samantha Greene called to inform staff that she isn't feeling well and will not be attending class today. She is going to watch her symptoms and contact her MD as needed.

## 2018-10-25 ENCOUNTER — Ambulatory Visit: Payer: BC Managed Care – PPO

## 2018-10-29 ENCOUNTER — Other Ambulatory Visit: Payer: Self-pay

## 2018-10-29 ENCOUNTER — Ambulatory Visit (INDEPENDENT_AMBULATORY_CARE_PROVIDER_SITE_OTHER): Payer: BC Managed Care – PPO | Admitting: Neurology

## 2018-10-29 ENCOUNTER — Encounter: Payer: BC Managed Care – PPO | Admitting: *Deleted

## 2018-10-29 DIAGNOSIS — G4730 Sleep apnea, unspecified: Secondary | ICD-10-CM

## 2018-10-29 DIAGNOSIS — Z791 Long term (current) use of non-steroidal anti-inflammatories (NSAID): Secondary | ICD-10-CM | POA: Diagnosis not present

## 2018-10-29 DIAGNOSIS — G4733 Obstructive sleep apnea (adult) (pediatric): Secondary | ICD-10-CM | POA: Diagnosis not present

## 2018-10-29 DIAGNOSIS — Z8042 Family history of malignant neoplasm of prostate: Secondary | ICD-10-CM | POA: Diagnosis not present

## 2018-10-29 DIAGNOSIS — Z801 Family history of malignant neoplasm of trachea, bronchus and lung: Secondary | ICD-10-CM | POA: Diagnosis not present

## 2018-10-29 DIAGNOSIS — Z800 Family history of malignant neoplasm of digestive organs: Secondary | ICD-10-CM | POA: Diagnosis not present

## 2018-10-29 DIAGNOSIS — K219 Gastro-esophageal reflux disease without esophagitis: Secondary | ICD-10-CM | POA: Diagnosis not present

## 2018-10-29 DIAGNOSIS — F5104 Psychophysiologic insomnia: Secondary | ICD-10-CM

## 2018-10-29 DIAGNOSIS — R0602 Shortness of breath: Secondary | ICD-10-CM

## 2018-10-29 DIAGNOSIS — Z79899 Other long term (current) drug therapy: Secondary | ICD-10-CM | POA: Diagnosis not present

## 2018-10-29 DIAGNOSIS — F419 Anxiety disorder, unspecified: Secondary | ICD-10-CM | POA: Diagnosis not present

## 2018-10-29 DIAGNOSIS — K440 Diaphragmatic hernia with obstruction, without gangrene: Secondary | ICD-10-CM

## 2018-10-29 DIAGNOSIS — E785 Hyperlipidemia, unspecified: Secondary | ICD-10-CM | POA: Diagnosis not present

## 2018-10-29 DIAGNOSIS — R0609 Other forms of dyspnea: Secondary | ICD-10-CM | POA: Diagnosis not present

## 2018-10-29 DIAGNOSIS — F329 Major depressive disorder, single episode, unspecified: Secondary | ICD-10-CM | POA: Diagnosis not present

## 2018-10-29 DIAGNOSIS — R0683 Snoring: Secondary | ICD-10-CM

## 2018-10-29 DIAGNOSIS — M1990 Unspecified osteoarthritis, unspecified site: Secondary | ICD-10-CM | POA: Diagnosis not present

## 2018-10-29 DIAGNOSIS — Z7982 Long term (current) use of aspirin: Secondary | ICD-10-CM | POA: Diagnosis not present

## 2018-10-29 DIAGNOSIS — Z803 Family history of malignant neoplasm of breast: Secondary | ICD-10-CM | POA: Diagnosis not present

## 2018-10-29 NOTE — Progress Notes (Signed)
Daily Session Note  Patient Details  Name: Samantha Greene MRN: [id number] Date of Birth: Mar 23, 1970 Referring Provider:     Pulmonary Rehab from 10/11/2018 in [facility] Cardiac and Pulmonary Rehab  Referring Provider  [place]      Encounter Date: 10/29/2018  Check In: Session Check In - 10/29/18 1433      Check-In   Supervising physician immediately available to respond to emergencies  See telemetry face sheet for immediately available ER MD    Location  [facility]-Cardiac & Pulmonary Rehab    Staff Present  Earlean Shawl, BS, ACSM CEP, Exercise Physiologist;Joseph Hood RCP,RRT,BSRT;Carroll Enterkin, RN, BSN-BC, CCRP    Virtual Visit  No    Medication changes reported      No    Fall or balance concerns reported     No    Warm-up and Cool-down  Performed on first and last piece of equipment    Resistance Training Performed  Yes    VAD Patient?  No    PAD/SET Patient?  No      Pain Assessment   Currently in Pain?  No/denies    Multiple Pain Sites  No          Social History   Tobacco Use  Smoking Status Never Smoker  Smokeless Tobacco Never Used    Goals Met:  Independence with exercise equipment Exercise tolerated well Personal goals reviewed No report of cardiac concerns or symptoms Strength training completed today  Goals Unmet:  Not Applicable  Comments: Pt able to follow exercise prescription today without complaint.  Will continue to monitor for progression.  Reviewed home exercise with pt today.  Pt plans to walk and use staff videos at home for exercise.  Reviewed THR, pulse, RPE, sign and symptoms, and when to call 911 or MD.  Also discussed weather considerations and indoor options.  Pt voiced understanding.    Dr. Emily Filbert is Medical Director for [place] and [facility].

## 2018-10-30 DIAGNOSIS — M5441 Lumbago with sciatica, right side: Secondary | ICD-10-CM

## 2018-10-30 MED ORDER — MELOXICAM 15 MG PO TABS: ORAL_TABLET | ORAL | 0 refills | Status: DC

## 2018-10-31 ENCOUNTER — Other Ambulatory Visit: Payer: Self-pay

## 2018-10-31 ENCOUNTER — Encounter: Payer: BC Managed Care – PPO | Admitting: *Deleted

## 2018-10-31 DIAGNOSIS — R0609 Other forms of dyspnea: Secondary | ICD-10-CM

## 2018-10-31 DIAGNOSIS — F419 Anxiety disorder, unspecified: Secondary | ICD-10-CM | POA: Diagnosis not present

## 2018-10-31 DIAGNOSIS — F329 Major depressive disorder, single episode, unspecified: Secondary | ICD-10-CM | POA: Diagnosis not present

## 2018-10-31 DIAGNOSIS — Z801 Family history of malignant neoplasm of trachea, bronchus and lung: Secondary | ICD-10-CM | POA: Diagnosis not present

## 2018-10-31 DIAGNOSIS — Z800 Family history of malignant neoplasm of digestive organs: Secondary | ICD-10-CM | POA: Diagnosis not present

## 2018-10-31 DIAGNOSIS — M1990 Unspecified osteoarthritis, unspecified site: Secondary | ICD-10-CM | POA: Diagnosis not present

## 2018-10-31 DIAGNOSIS — Z8042 Family history of malignant neoplasm of prostate: Secondary | ICD-10-CM | POA: Diagnosis not present

## 2018-10-31 DIAGNOSIS — Z803 Family history of malignant neoplasm of breast: Secondary | ICD-10-CM | POA: Diagnosis not present

## 2018-10-31 DIAGNOSIS — E785 Hyperlipidemia, unspecified: Secondary | ICD-10-CM | POA: Diagnosis not present

## 2018-10-31 DIAGNOSIS — K219 Gastro-esophageal reflux disease without esophagitis: Secondary | ICD-10-CM | POA: Diagnosis not present

## 2018-10-31 DIAGNOSIS — Z791 Long term (current) use of non-steroidal anti-inflammatories (NSAID): Secondary | ICD-10-CM | POA: Diagnosis not present

## 2018-10-31 DIAGNOSIS — Z79899 Other long term (current) drug therapy: Secondary | ICD-10-CM | POA: Diagnosis not present

## 2018-10-31 DIAGNOSIS — Z7982 Long term (current) use of aspirin: Secondary | ICD-10-CM | POA: Diagnosis not present

## 2018-10-31 NOTE — Progress Notes (Signed)
Daily Session Note  Patient Details  Name: Samantha Greene MRN: [id number] Date of Birth: 16-Nov-1970 Referring Provider:     Pulmonary Rehab from 10/11/2018 in [facility] Cardiac and Pulmonary Rehab  Referring Provider  [place]      Encounter Date: 10/31/2018  Check In: Session Check In - 10/31/18 1424      Check-In   Supervising physician immediately available to respond to emergencies  See telemetry face sheet for immediately available ER MD    Location  [facility]-Cardiac & Pulmonary Rehab    Staff Present  [name], [name];Jessica Luan Pulling, MA, RCEP, CCRP, CCET;Joseph [place] RCP,RRT,BSRT    Virtual Visit  No    Medication changes reported      No    Fall or balance concerns reported     No    Warm-up and Cool-down  Performed on first and last piece of equipment    Resistance Training Performed  Yes    VAD Patient?  No    PAD/SET Patient?  No      Pain Assessment   Currently in Pain?  No/denies          Social History   Tobacco Use  Smoking Status Never Smoker  Smokeless Tobacco Never Used    Goals Met:  Proper associated with RPD/PD & O2 Sat Independence with exercise equipment Using PLB without cueing & demonstrates good technique Exercise tolerated well No report of cardiac concerns or symptoms Strength training completed today  Goals Unmet:  Not Applicable  Comments: Pt able to follow exercise prescription today without complaint.  Will continue to monitor for progression.    Dr. Emily Filbert is Medical Director for [place] and [facility].

## 2018-11-01 ENCOUNTER — Ambulatory Visit: Payer: BC Managed Care – PPO

## 2018-11-05 ENCOUNTER — Ambulatory Visit: Payer: BC Managed Care – PPO

## 2018-11-06 ENCOUNTER — Ambulatory Visit: Payer: BC Managed Care – PPO

## 2018-11-07 ENCOUNTER — Encounter: Payer: Self-pay | Admitting: *Deleted

## 2018-11-07 ENCOUNTER — Ambulatory Visit: Payer: BC Managed Care – PPO

## 2018-11-07 ENCOUNTER — Encounter: Payer: Self-pay | Admitting: Neurology

## 2018-11-07 DIAGNOSIS — K440 Diaphragmatic hernia with obstruction, without gangrene: Secondary | ICD-10-CM

## 2018-11-07 DIAGNOSIS — Z6838 Body mass index (BMI) 38.0-38.9, adult: Secondary | ICD-10-CM | POA: Insufficient documentation

## 2018-11-07 DIAGNOSIS — F5104 Psychophysiologic insomnia: Secondary | ICD-10-CM | POA: Insufficient documentation

## 2018-11-07 DIAGNOSIS — E662 Morbid (severe) obesity with alveolar hypoventilation: Secondary | ICD-10-CM | POA: Insufficient documentation

## 2018-11-07 DIAGNOSIS — R0609 Other forms of dyspnea: Secondary | ICD-10-CM

## 2018-11-07 DIAGNOSIS — R0683 Snoring: Secondary | ICD-10-CM | POA: Insufficient documentation

## 2018-11-07 DIAGNOSIS — R0602 Shortness of breath: Secondary | ICD-10-CM

## 2018-11-07 HISTORY — DX: Diaphragmatic hernia with obstruction, without gangrene: K44.0

## 2018-11-07 NOTE — Progress Notes (Signed)
Pulmonary Individual Treatment Plan  Patient Details  Name: Samantha Greene MRN: [id number] Date of Birth: Jun 11, 1970 Referring Provider:     Pulmonary Rehab from 10/11/2018 in [facility] Cardiac and Pulmonary Rehab  Referring Provider  McKeown      Initial Encounter Date:    Pulmonary Rehab from 10/11/2018 in [facility] Cardiac and Pulmonary Rehab  Date  10/11/18      Visit Diagnosis: DOE (dyspnea on exertion)  Patient's Home Medications on Admission:  Current Outpatient Medications:  .  acetaminophen (TYLENOL) 500 MG tablet, Take 2 tablets (1,000 mg total) by mouth every 6 (six) hours as needed., Disp: 30 tablet, Rfl: 0 .  albuterol (VENTOLIN HFA) 108 (90 Base) MCG/ACT inhaler, Inhale 2 puffs into the lungs every 4 (four) hours as needed for wheezing or shortness of breath., Disp: 1 Inhaler, Rfl: 0 .  aspirin 81 MG tablet, Take 81 mg by mouth daily., Disp: , Rfl:  .  baclofen (LIORESAL) 10 MG tablet, Take 1/2 to 1 tablet 1 or 2 x /day as needed for Muscle Spasms, Disp: 180 tablet, Rfl: 1 .  buPROPion (WELLBUTRIN XL) 300 MG 24 hr tablet, Take 1 tablet every Morning for Mood, Focus & Concentration, Disp: 90 tablet, Rfl: 3 .  cetirizine (ZYRTEC) 10 MG tablet, Take 10 mg by mouth at bedtime., Disp: , Rfl:  .  Cholecalciferol (VITAMIN D) 125 MCG (5000 UT) CAPS, Take 5,000 Units by mouth daily. , Disp: , Rfl:  .  escitalopram (LEXAPRO) 20 MG tablet, TAKE 1 TABLET BY MOUTH EVERY DAY, Disp: 90 tablet, Rfl: 1 .  Eszopiclone 3 MG TABS, TAKE 1 TABLET BY MOUTH AT BEDTIME AS NEEDED. TAKE IMMEDIATELY BEFORE BEDTIME, Disp: 30 tablet, Rfl: 3 .  fluocinolone (SYNALAR) 0.025 % ointment, APPLY TO AFFECTED AREA TWICE A DAY, Disp: 30 g, Rfl: 0 .  fluticasone (FLONASE) 50 MCG/ACT nasal spray, Place 2 sprays into both nostrils daily., Disp: 16 g, Rfl: 2 .  gabapentin (NEURONTIN) 300 MG capsule, Take 1 capsule (300 mg total) by mouth 3 (three) times daily. (Patient taking differently: Take 300 mg by mouth 3 (three)  times daily. Patient taking BID), Disp: 90 capsule, Rfl: 2 .  ibuprofen (ADVIL,MOTRIN) 800 MG tablet, Take 1 tablet (800 mg total) by mouth every 8 (eight) hours as needed (breakthrough pain)., Disp: 30 tablet, Rfl: 0 .  meloxicam (MOBIC) 15 MG tablet, Take 1/2 to 1 tablet Daily for Pain & Inflammation & limit to 4-5 tablets  /week to Avoid Kidney Damage, Disp: 90 tablet, Rfl: 0 .  montelukast (SINGULAIR) 10 MG tablet, Take 1 tablet (10 mg total) by mouth at bedtime. Take 1 tablet daily for Allergies, Disp: 90 tablet, Rfl: 3 .  nystatin cream (MYCOSTATIN), Apply 1 application topically 2 (two) times daily. (Patient taking differently: Apply 1 application topically 2 (two) times daily as needed for dry skin. ), Disp: 30 g, Rfl: 1 .  omeprazole (PRILOSEC) 40 MG capsule, TAKE 1 CAPSULE BY MOUTH EVERY DAY, Disp: 90 capsule, Rfl: 0 .  rosuvastatin (CRESTOR) 5 MG tablet, Take 1 tablet (5 mg total) by mouth at bedtime., Disp: 90 tablet, Rfl: 3 .  SPRINTEC 28 0.25-35 MG-MCG tablet, TAKE 1 TABLET BY MOUTH EVERY DAY, Disp: 84 tablet, Rfl: 0 .  telmisartan (MICARDIS) 40 MG tablet, Take 1 tablet Daily for BP, Disp: 90 tablet, Rfl: 1 .  triamcinolone ointment (KENALOG) 0.1 %, Apply 1 application topically 2 (two) times daily. (Patient taking differently: Apply 1 application topically daily as  needed (rash). ), Disp: 80 g, Rfl: 1 .  vitamin C (ASCORBIC ACID) 500 MG tablet, Take 500 mg by mouth daily., Disp: , Rfl:  .  vitamin E 600 UNIT capsule, Take 600 Units by mouth daily., Disp: , Rfl:   Past Medical History: Past Medical History:  Diagnosis Date  . Anxiety   . Arthritis   . Depression    no meds  . Dyspnea    can happen anytime  . Family history of breast cancer   . Family history of colon cancer   . Family history of lung cancer   . Family history of pancreatic cancer   . Family history of prostate cancer   . GERD (gastroesophageal reflux disease)   . Hernia, diaphragmatic   . History of kidney  stones   . Hyperlipidemia   . Hypertension    no meds  . Pneumonia    1 time    Tobacco Use: Social History   Tobacco Use  Smoking Status Never Smoker  Smokeless Tobacco Never Used    Labs: Recent Review Flowsheet Data    Labs for ITP Cardiac and Pulmonary Rehab Latest Ref Rng & Units 10/12/2017 03/08/2018 03/21/2018 03/24/2018 09/11/2018   Cholestrol <200 mg/dL 241(H) 210(H) - - 226(H)   LDLCALC mg/dL (calc) 118(H) 108(H) - - 132(H)   HDL > OR = 50 mg/dL 89 76 - - 62   Trlycerides <150 mg/dL 216(H) 150(H) - - 188(H)   Hemoglobin A1c <5.7 % of total Hgb 5.3 - - - 5.6   PHART 7.350 - 7.450 - - 7.427 7.361 -   PCO2ART 32.0 - 48.0 mmHg - - 35.9 45.9 -   HCO3 20.0 - 28.0 mmol/L - - 23.2 25.4 -   ACIDBASEDEF 0.0 - 2.0 mmol/L - - 0.6 - -   O2SAT % - - 96.8 96.7 -       Pulmonary Assessment Scores: Pulmonary Assessment Scores    Row Name 10/11/18 1459         ADL UCSD   Rest  1     Walk  3     Stairs  5     Bath  1     Dress  2     Shop  3       CAT Score   CAT Score  25       mMRC Score   mMRC Score  2        UCSD: Self-administered rating of dyspnea associated with activities of daily living (ADLs) 6-point scale (0 = "not at all" to 5 = "maximal or unable to do because of breathlessness")  Scoring Scores range from 0 to 120.  Minimally important difference is 5 units  CAT: CAT can identify the health impairment of COPD patients and is better correlated with disease progression.  CAT has a scoring range of zero to 40. The CAT score is classified into four groups of low (less than 10), medium (10 - 20), high (21-30) and very high (31-40) based on the impact level of disease on health status. A CAT score over 10 suggests significant symptoms.  A worsening CAT score could be explained by an exacerbation, poor medication adherence, poor inhaler technique, or progression of COPD or comorbid conditions.  CAT MCID is 2 points  mMRC: mMRC (Modified Medical Research  Council) Dyspnea Scale is used to assess the degree of baseline functional disability in patients of respiratory disease due to dyspnea. No minimal important  difference is established. A decrease in score of 1 point or greater is considered a positive change.   Pulmonary Function Assessment:   Exercise Target Goals: Exercise Program Goal: Individual exercise prescription set using results from initial 6 min walk test and THRR while considering  patient's activity barriers and safety.   Exercise Prescription Goal: Initial exercise prescription builds to 30-45 minutes a day of aerobic activity, 2-3 days per week.  Home exercise guidelines will be given to patient during program as part of exercise prescription that the participant will acknowledge.  Activity Barriers & Risk Stratification: Activity Barriers & Cardiac Risk Stratification - 09/24/18 1735      Activity Barriers & Cardiac Risk Stratification   Activity Barriers  Other (comment)    Comments  weak ankles  panter facitis, muscle spasm neck and back       6 Minute Walk: 6 Minute Walk    Row Name 10/11/18 1445         6 Minute Walk   Phase  Initial     Distance  1300 feet     Walk Time  6 minutes     # of Rest Breaks  0     MPH  2.46     METS  3.48     RPE  13     Perceived Dyspnea   3     VO2 Peak  12.2     Symptoms  No     Resting HR  84 bpm     Resting BP  122/74     Resting Oxygen Saturation   98 %     Exercise Oxygen Saturation  during 6 min walk  90 %     Max Ex. HR  107 bpm     Max Ex. BP  138/84     2 Minute Post BP  126/78       Interval HR   1 Minute HR  92     2 Minute HR  103     3 Minute HR  106     4 Minute HR  107     5 Minute HR  103     6 Minute HR  103     2 Minute Post HR  88     Interval Heart Rate?  Yes       Interval Oxygen   Interval Oxygen?  Yes     Baseline Oxygen Saturation %  98 %     1 Minute Oxygen Saturation %  91 %     1 Minute Liters of Oxygen  0 L     2 Minute Oxygen  Saturation %  90 %     2 Minute Liters of Oxygen  0 L     3 Minute Oxygen Saturation %  91 %     3 Minute Liters of Oxygen  0 L     4 Minute Oxygen Saturation %  93 %     4 Minute Liters of Oxygen  0 L     5 Minute Oxygen Saturation %  92 %     5 Minute Liters of Oxygen  0 L     6 Minute Oxygen Saturation %  93 %     6 Minute Liters of Oxygen  0 L     2 Minute Post Oxygen Saturation %  98 %     2 Minute Post Liters of Oxygen  0 L  Oxygen Initial Assessment: Oxygen Initial Assessment - 10/17/18 1423      Home Oxygen   Home Oxygen Device  None    Sleep Oxygen Prescription  None    Home Exercise Oxygen Prescription  None    Home at Rest Exercise Oxygen Prescription  None      Initial 6 min Walk   Oxygen Used  None      Program Oxygen Prescription   Program Oxygen Prescription  None      Intervention   Short Term Goals  To learn and exhibit compliance with exercise, home and travel O2 prescription;To learn and understand importance of monitoring SPO2 with pulse oximeter and demonstrate accurate use of the pulse oximeter.;To learn and understand importance of maintaining oxygen saturations>88%;To learn and demonstrate proper pursed lip breathing techniques or other breathing techniques.;To learn and demonstrate proper use of respiratory medications    Long  Term Goals  Exhibits compliance with exercise, home and travel O2 prescription;Verbalizes importance of monitoring SPO2 with pulse oximeter and return demonstration;Maintenance of O2 saturations>88%;Exhibits proper breathing techniques, such as pursed lip breathing or other method taught during program session;Compliance with respiratory medication;Demonstrates proper use of MDI's       Oxygen Re-Evaluation: Oxygen Re-Evaluation    Row Name 10/17/18 1424 10/29/18 1537           Program Oxygen Prescription   Program Oxygen Prescription  None  None        Home Oxygen   Home Oxygen Device  None  None      Sleep Oxygen  Prescription  None  None      Home Exercise Oxygen Prescription  None  None      Home at Rest Exercise Oxygen Prescription  None  None        Goals/Expected Outcomes   Short Term Goals  To learn and exhibit compliance with exercise, home and travel O2 prescription;To learn and understand importance of monitoring SPO2 with pulse oximeter and demonstrate accurate use of the pulse oximeter.;To learn and understand importance of maintaining oxygen saturations>88%;To learn and demonstrate proper pursed lip breathing techniques or other breathing techniques.;To learn and demonstrate proper use of respiratory medications  To learn and exhibit compliance with exercise, home and travel O2 prescription;To learn and understand importance of monitoring SPO2 with pulse oximeter and demonstrate accurate use of the pulse oximeter.;To learn and understand importance of maintaining oxygen saturations>88%;To learn and demonstrate proper pursed lip breathing techniques or other breathing techniques.;To learn and demonstrate proper use of respiratory medications      Long  Term Goals  Exhibits compliance with exercise, home and travel O2 prescription;Verbalizes importance of monitoring SPO2 with pulse oximeter and return demonstration;Maintenance of O2 saturations>88%;Exhibits proper breathing techniques, such as pursed lip breathing or other method taught during program session;Compliance with respiratory medication;Demonstrates proper use of MDI's  Exhibits compliance with exercise, home and travel O2 prescription;Verbalizes importance of monitoring SPO2 with pulse oximeter and return demonstration;Maintenance of O2 saturations>88%;Exhibits proper breathing techniques, such as pursed lip breathing or other method taught during program session;Compliance with respiratory medication;Demonstrates proper use of MDI's      Comments  Reviewed PLB technique with pt.  Talked about how it work and it's important to maintaining his  exercise saturations.  She uses her inhaler as needed.  She is doing a sleep study tonight.  She has been using her PLB at home and finds it helpful.  She will keep [place] updated on her sleep study.  Goals/Expected Outcomes  Short: Become more profiecient at using PLB.   Long: Become independent at using PLB.  Short: Continue to use PLB.  Long: Continued compliance.         Oxygen Discharge (Final Oxygen Re-Evaluation): Oxygen Re-Evaluation - 10/29/18 1537      Program Oxygen Prescription   Program Oxygen Prescription  None      Home Oxygen   Home Oxygen Device  None    Sleep Oxygen Prescription  None    Home Exercise Oxygen Prescription  None    Home at Rest Exercise Oxygen Prescription  None      Goals/Expected Outcomes   Short Term Goals  To learn and exhibit compliance with exercise, home and travel O2 prescription;To learn and understand importance of monitoring SPO2 with pulse oximeter and demonstrate accurate use of the pulse oximeter.;To learn and understand importance of maintaining oxygen saturations>88%;To learn and demonstrate proper pursed lip breathing techniques or other breathing techniques.;To learn and demonstrate proper use of respiratory medications    Long  Term Goals  Exhibits compliance with exercise, home and travel O2 prescription;Verbalizes importance of monitoring SPO2 with pulse oximeter and return demonstration;Maintenance of O2 saturations>88%;Exhibits proper breathing techniques, such as pursed lip breathing or other method taught during program session;Compliance with respiratory medication;Demonstrates proper use of MDI's    Comments  She uses her inhaler as needed.  She is doing a sleep study tonight.  She has been using her PLB at home and finds it helpful.  She will keep [place] updated on her sleep study.    Goals/Expected Outcomes  Short: Continue to use PLB.  Long: Continued compliance.       Initial Exercise Prescription: Initial Exercise Prescription -  10/11/18 1400      Date of Initial Exercise RX and Referring Provider   Date  10/11/18    Referring Provider  McKeown      Treadmill   MPH  2.4    Grade  0.5    Minutes  15    METs  3      Recumbant Bike   Level  3    RPM  60    Watts  45    Minutes  15    METs  3      Elliptical   Level  1    Speed  3    Minutes  15      Biostep-RELP   Level  3    SPM  50    Minutes  15    METs  3      Prescription Details   Frequency (times per week)  3    Duration  Progress to 30 minutes of continuous aerobic without signs/symptoms of physical distress      Intensity   THRR 40-80% of Max Heartrate  119-154    Ratings of Perceived Exertion  11-15    Perceived Dyspnea  0-4      Resistance Training   Training Prescription  Yes    Weight  3 lb    Reps  10-15       Perform Capillary Blood Glucose checks as needed.  Exercise Prescription Changes: Exercise Prescription Changes    Row Name 10/11/18 1400 10/29/18 1600           Response to Exercise   Blood Pressure (Admit)  122/74  -      Blood Pressure (Exercise)  138/84  -      Blood Pressure (  Exit)  126/78  -      Heart Rate (Admit)  84 bpm  -      Heart Rate (Exercise)  107 bpm  -      Heart Rate (Exit)  88 bpm  -      Oxygen Saturation (Admit)  98 %  -      Oxygen Saturation (Exercise)  90 %  -      Oxygen Saturation (Exit)  98 %  -      Rating of Perceived Exertion (Exercise)  13  -      Perceived Dyspnea (Exercise)  3  -      Symptoms  none  -      Comments  orientation  -        Home Exercise Plan   Plans to continue exercise at  -  Home (comment) walking, videos      Frequency  -  Add 2 additional days to program exercise sessions.      Initial Home Exercises Provided  -  10/29/18         Exercise Comments:   Exercise Goals and Review: Exercise Goals    Row Name 10/11/18 1506             Exercise Goals   Increase Physical Activity  Yes       Intervention  Provide advice, education, support  and counseling about physical activity/exercise needs.;Develop an individualized exercise prescription for aerobic and resistive training based on initial evaluation findings, risk stratification, comorbidities and participant's personal goals.       Expected Outcomes  Short Term: Attend rehab on a regular basis to increase amount of physical activity.;Long Term: Add in home exercise to make exercise part of routine and to increase amount of physical activity.;Long Term: Exercising regularly at least 3-5 days a week.       Increase Strength and Stamina  Yes       Intervention  Provide advice, education, support and counseling about physical activity/exercise needs.;Develop an individualized exercise prescription for aerobic and resistive training based on initial evaluation findings, risk stratification, comorbidities and participant's personal goals.       Expected Outcomes  Short Term: Increase workloads from initial exercise prescription for resistance, speed, and METs.;Short Term: Perform resistance training exercises routinely during rehab and add in resistance training at home;Long Term: Improve cardiorespiratory fitness, muscular endurance and strength as measured by increased METs and functional capacity (6MWT)       Able to understand and use rate of perceived exertion (RPE) scale  Yes       Intervention  Provide education and explanation on how to use RPE scale       Expected Outcomes  Short Term: Able to use RPE daily in rehab to express subjective intensity level;Long Term:  Able to use RPE to guide intensity level when exercising independently       Able to understand and use Dyspnea scale  Yes       Intervention  Provide education and explanation on how to use Dyspnea scale       Expected Outcomes  Short Term: Able to use Dyspnea scale daily in rehab to express subjective sense of shortness of breath during exertion;Long Term: Able to use Dyspnea scale to guide intensity level when exercising  independently       Knowledge and understanding of Target Heart Rate Range (THRR)  Yes       Intervention  Provide education  and explanation of THRR including how the numbers were predicted and where they are located for reference       Expected Outcomes  Short Term: Able to state/look up THRR;Short Term: Able to use daily as guideline for intensity in rehab;Long Term: Able to use THRR to govern intensity when exercising independently       Able to check pulse independently  Yes       Intervention  Provide education and demonstration on how to check pulse in carotid and radial arteries.;Review the importance of being able to check your own pulse for safety during independent exercise       Expected Outcomes  Short Term: Able to explain why pulse checking is important during independent exercise;Long Term: Able to check pulse independently and accurately       Understanding of Exercise Prescription  Yes       Intervention  Provide education, explanation, and written materials on patient's individual exercise prescription       Expected Outcomes  Short Term: Able to explain program exercise prescription;Long Term: Able to explain home exercise prescription to exercise independently          Exercise Goals Re-Evaluation : Exercise Goals Re-Evaluation    Row Name 10/11/18 1506 10/17/18 1422 10/29/18 1606         Exercise Goal Re-Evaluation   Exercise Goals Review  Increase Strength and Stamina;Able to understand and use rate of perceived exertion (RPE) scale;Able to understand and use Dyspnea scale;Knowledge and understanding of Target Heart Rate Range (THRR);Able to check pulse independently;Understanding of Exercise Prescription  Increase Physical Activity;Able to understand and use rate of perceived exertion (RPE) scale;Knowledge and understanding of Target Heart Rate Range (THRR);Understanding of Exercise Prescription;Increase Strength and Stamina;Able to understand and use Dyspnea scale;Able to  check pulse independently  Increase Physical Activity;Able to understand and use rate of perceived exertion (RPE) scale;Knowledge and understanding of Target Heart Rate Range (THRR);Understanding of Exercise Prescription;Increase Strength and Stamina;Able to understand and use Dyspnea scale;Able to check pulse independently     Comments  -  Reviewed RPE scale, THR and program prescription with pt today.  Pt voiced understanding and was given a copy of goals to take home.  [name] is off to a good start in rehab.  She wants to go ahead and start walking more on her off days.  Reviewed home exercise with pt today.  Pt plans to walk and use staff videos at home for exercise.  Reviewed THR, pulse, RPE, sign and symptoms, and when to call 911 or MD.  Also discussed weather considerations and indoor options.  Pt voiced understanding.     Expected Outcomes  -  Short: Use RPE daily to regulate intensity. Long: Follow program prescription in THR.  Short: Add in exercise on off days from rehab.  Long: Continue to improve stamina.        Discharge Exercise Prescription (Final Exercise Prescription Changes): Exercise Prescription Changes - 10/29/18 1600      Home Exercise Plan   Plans to continue exercise at  Home (comment)   walking, videos   Frequency  Add 2 additional days to program exercise sessions.    Initial Home Exercises Provided  10/29/18       Nutrition:  Target Goals: Understanding of nutrition guidelines, daily intake of sodium '1500mg'$ , cholesterol '200mg'$ , calories 30% from fat and 7% or less from saturated fats, daily to have 5 or more servings of fruits and vegetables.  Biometrics: Pre Biometrics -  10/11/18 1453      Pre Biometrics   Height  5' 0.5" (1.537 m)    Weight  210 lb 1.6 oz (95.3 kg)    BMI (Calculated)  40.34    Single Leg Stand  10.6 seconds        Nutrition Therapy Plan and Nutrition Goals: Nutrition Therapy & Goals - 10/11/18 1436      Nutrition Therapy   Diet   Low Na, HH diet    Protein (specify units)  75-80g    Fiber  25 grams    Whole Grain Foods  3 servings    Saturated Fats  12 max. grams    Fruits and Vegetables  5 servings/day    Sodium  1.5 grams      Personal Nutrition Goals   Nutrition Goal  ST: Pt wants to try hello fresh LT: Weight loss 50lb (5-10lbs now)    Comments  Pt reports not eating that much during the day. B: cheerios with 2% milk. L/D: (2 meals per day) usually [employer]im[employer] or another kind of take out. For a snack sometimes she will have airpopped popcorn with her own seasoning. pt reports not cooking and having difficulty with motivation, pt reports hx of depression. Discussed HH eating, mindful eating/intuitive eating, food anthropologist (no judgement), weight loss expectations, and about her support system.      Intervention Plan   Intervention  Prescribe, educate and counsel regarding individualized specific dietary modifications aiming towards targeted core components such as weight, hypertension, lipid management, diabetes, heart failure and other comorbidities.;Nutrition handout(s) given to patient.    Expected Outcomes  Short Term Goal: Understand basic principles of dietary content, such as calories, fat, sodium, cholesterol and nutrients.;Short Term Goal: A plan has been developed with personal nutrition goals set during dietitian appointment.;Long Term Goal: Adherence to prescribed nutrition plan.       Nutrition Assessments: Nutrition Assessments - 10/11/18 1459      MEDFICTS Scores   Pre Score  16       Nutrition Goals Re-Evaluation: Nutrition Goals Re-Evaluation    [place] Name 10/22/18 1444             Goals   Nutrition Goal  ST: Pt wants to try hello fresh LT: Weight loss 50lb (5-10lbs now)       Comment  Pt reports she and her husband have had a lot going on and she will get hello fresh within the next two weeks. Pt reports otherwise doing ok, talking with her doctor about therapy.       Expected  Outcome  ST: Pt wants to try hello fresh LT: Weight loss 50lb (5-10lbs now)          Nutrition Goals Discharge (Final Nutrition Goals Re-Evaluation): Nutrition Goals Re-Evaluation - 10/22/18 1444      Goals   Nutrition Goal  ST: Pt wants to try hello fresh LT: Weight loss 50lb (5-10lbs now)    Comment  Pt reports she and her husband have had a lot going on and she will get hello fresh within the next two weeks. Pt reports otherwise doing ok, talking with her doctor about therapy.    Expected Outcome  ST: Pt wants to try hello fresh LT: Weight loss 50lb (5-10lbs now)       Psychosocial: Target Goals: Acknowledge presence or absence of significant depression and/or stress, maximize coping skills, provide positive support system. Participant is able to verbalize types and ability to use techniques  and skills needed for reducing stress and depression.   Initial Review & Psychosocial Screening: Initial Psych Review & Screening - 09/24/18 1737      Initial Review   Current issues with  Current Psychotropic Meds;Current Sleep Concerns;Current Depression;Current Stress Concerns    Comments  Stress is part of her nature      Family Dynamics   Good Support System?  Yes   Good friends,Husband, Twin Brother , Aunt     Barriers   Psychosocial barriers to participate in program  There are no identifiable barriers or psychosocial needs.;The patient should benefit from training in stress management and relaxation.      Screening Interventions   Interventions  Encouraged to exercise;To provide support and resources with identified psychosocial needs;Provide feedback about the scores to participant    Expected Outcomes  Short Term goal: Utilizing psychosocial counselor, staff and physician to assist with identification of specific Stressors or current issues interfering with healing process. Setting desired goal for each stressor or current issue identified.;Long Term Goal: Stressors or current issues  are controlled or eliminated.;Short Term goal: Identification and review with participant of any Quality of Life or Depression concerns found by scoring the questionnaire.;Long Term goal: The participant improves quality of Life and PHQ9 Scores as seen by post scores and/or verbalization of changes       Quality of Life Scores: Quality of Life - 10/11/18 1458      Quality of Life   Select  Quality of Life      Scores of 19 and below usually indicate a poorer quality of life in these areas.  A difference of  2-3 points is a clinically meaningful difference.  A difference of 2-3 points in the total score of the Quality of Life Index has been associated with significant improvement in overall quality of life, self-image, physical symptoms, and general health in studies assessing change in quality of life.  PHQ-9: Recent Review Flowsheet Data    Depression screen [facility] 2/9 10/29/2018   Decreased Interest 3   Down, Depressed, Hopeless 3   PHQ - 2 Score 6   Altered sleeping 1   Tired, decreased energy 3   Change in appetite 3   Feeling bad or failure about yourself  3   Trouble concentrating 3   Moving slowly or fidgety/restless 2   Suicidal thoughts 0   PHQ-9 Score 21   Difficult doing work/chores Very difficult     Interpretation of Total Score  Total Score Depression Severity:  1-4 = Minimal depression, 5-9 = Mild depression, 10-14 = Moderate depression, 15-19 = Moderately severe depression, 20-27 = Severe depression   Psychosocial Evaluation and Intervention: Psychosocial Evaluation - 10/11/18 1501      Psychosocial Evaluation & Interventions   Comments  Pt reports hx of depression with lack of motivation to do things that interferes with her life and pt mentioned her medications have not seemed to work at all despite being on them for 4-5 monhts; encouraged her to speak to her perscribing doctor. Discussed how counseling/therapy may also be beneficial ; encouraged her to ask her  PMD.       Psychosocial Re-Evaluation: Psychosocial Re-Evaluation    Row Name 10/29/18 1529             Psychosocial Re-Evaluation   Current issues with  Current Stress Concerns;Current Depression;Current Psychotropic Meds       Comments  [name] is doing well in rehab. She continues to feel sad all  the time, but does note that it is better on days she exercises, so she is going to try to exercise a little more often.  She feels that she puts on a happy face.  She is taking lexapro and wellbutrin.  The lexapro is newer.  The wellbutrin has been more long term and we talked about maybe talking with her doctor about uping her dosage. Completed PHQ9 questionnarie today.  She uses medications to sleep and is completing a sleep study tonight at home.       Expected Outcomes  Short: Talk to doctor about meds.  Long: Continue work on improving depression.       Interventions  Stress management education;Encouraged to attend Pulmonary Rehabilitation for the exercise       Continue Psychosocial Services   Follow up required by staff       Comments  Stress is part of her nature          Psychosocial Discharge (Final Psychosocial Re-Evaluation): Psychosocial Re-Evaluation - 10/29/18 1529      Psychosocial Re-Evaluation   Current issues with  Current Stress Concerns;Current Depression;Current Psychotropic Meds    Comments  [name] is doing well in rehab. She continues to feel sad all the time, but does note that it is better on days she exercises, so she is going to try to exercise a little more often.  She feels that she puts on a happy face.  She is taking lexapro and wellbutrin.  The lexapro is newer.  The wellbutrin has been more long term and we talked about maybe talking with her doctor about uping her dosage. Completed PHQ9 questionnarie today.  She uses medications to sleep and is completing a sleep study tonight at home.    Expected Outcomes  Short: Talk to doctor about meds.  Long:  Continue work on improving depression.    Interventions  Stress management education;Encouraged to attend Pulmonary Rehabilitation for the exercise    Continue Psychosocial Services   Follow up required by staff    Comments  Stress is part of her nature       Education: Education Goals: Education classes will be provided on a weekly basis, covering required topics. Participant will state understanding/return demonstration of topics presented.  Learning Barriers/Preferences: Learning Barriers/Preferences - 09/24/18 1740      Learning Barriers/Preferences   Learning Barriers  Hearing   ADD,  Words not always trasmit correctly when hearing or speaking   Learning Preferences  None       Education Topics:  Initial Evaluation Education: - Verbal, written and demonstration of respiratory meds, oximetry and breathing techniques. Instruction on use of nebulizers and MDIs and importance of monitoring MDI activations.   General Nutrition Guidelines/Fats and Fiber: -Group instruction provided by verbal, written material, models and posters to present the general guidelines for heart healthy nutrition. Gives an explanation and review of dietary fats and fiber.   Controlling Sodium/Reading Food Labels: -Group verbal and written material supporting the discussion of sodium use in heart healthy nutrition. Review and explanation with models, verbal and written materials for utilization of the food label.   Exercise Physiology & General Exercise Guidelines: - Group verbal and written instruction with models to review the exercise physiology of the cardiovascular system and associated critical values. Provides general exercise guidelines with specific guidelines to those with heart or lung disease.    Aerobic Exercise & Resistance Training: - Gives group verbal and written instruction on the various components of exercise.  Focuses on aerobic and resistive training programs and the benefits of  this training and how to safely progress through these programs.   Flexibility, Balance, Mind/Body Relaxation: Provides group verbal/written instruction on the benefits of flexibility and balance training, including mind/body exercise modes such as yoga, pilates and tai chi.  Demonstration and skill practice provided.   Stress and Anxiety: - Provides group verbal and written instruction about the health risks of elevated stress and causes of high stress.  Discuss the correlation between heart/lung disease and anxiety and treatment options. Review healthy ways to manage with stress and anxiety.   Depression: - Provides group verbal and written instruction on the correlation between heart/lung disease and depressed mood, treatment options, and the stigmas associated with seeking treatment.   Exercise & Equipment Safety: - Individual verbal instruction and demonstration of equipment use and safety with use of the equipment.   Pulmonary Rehab from 10/11/2018 in [facility] Cardiac and Pulmonary Rehab  Date  10/11/18  Educator  AS  Instruction Review Code  1- Verbalizes Understanding      Infection Prevention: - Provides verbal and written material to individual with discussion of infection control including proper hand washing and proper equipment cleaning during exercise session.   Pulmonary Rehab from 10/11/2018 in [facility] Cardiac and Pulmonary Rehab  Date  10/11/18  Educator  AS  Instruction Review Code  1- Verbalizes Understanding      Falls Prevention: - Provides verbal and written material to individual with discussion of falls prevention and safety.   Pulmonary Rehab from 10/11/2018 in [facility] Cardiac and Pulmonary Rehab  Date  10/11/18  Educator  AS  Instruction Review Code  1- Verbalizes Understanding      Diabetes: - Individual verbal and written instruction to review signs/symptoms of diabetes, desired ranges of glucose level fasting, after meals and with exercise. Advice that pre  and post exercise glucose checks will be done for 3 sessions at entry of program.   Chronic Lung Diseases: - Group verbal and written instruction to review updates, respiratory medications, advancements in procedures and treatments. Discuss use of supplemental oxygen including available portable oxygen systems, continuous and intermittent flow rates, concentrators, personal use and safety guidelines. Review proper use of inhaler and spacers. Provide informative websites for self-education.    Energy Conservation: - Provide group verbal and written instruction for methods to conserve energy, plan and organize activities. Instruct on pacing techniques, use of adaptive equipment and posture/positioning to relieve shortness of breath.   Triggers and Exacerbations: - Group verbal and written instruction to review types of environmental triggers and ways to prevent exacerbations. Discuss weather changes, air quality and the benefits of nasal washing. Review warning signs and symptoms to help prevent infections. Discuss techniques for effective airway clearance, coughing, and vibrations.   AED/CPR: - Group verbal and written instruction with the use of models to demonstrate the basic use of the AED with the basic ABC's of resuscitation.   Anatomy and Physiology of the Lungs: - Group verbal and written instruction with the use of models to provide basic lung anatomy and physiology related to function, structure and complications of lung disease.   Anatomy & Physiology of the Heart: - Group verbal and written instruction and models provide basic cardiac anatomy and physiology, with the coronary electrical and arterial systems. Review of Valvular disease and Heart Failure   Cardiac Medications: - Group verbal and written instruction to review commonly prescribed medications for heart disease. Reviews the medication, class of the  drug, and side effects.   Know Your Numbers and Risk  Factors: -Group verbal and written instruction about important numbers in your health.  Discussion of what are risk factors and how they play a role in the disease process.  Review of Cholesterol, Blood Pressure, Diabetes, and BMI and the role they play in your overall health.   Sleep Hygiene: -Provides group verbal and written instruction about how sleep can affect your health.  Define sleep hygiene, discuss sleep cycles and impact of sleep habits. Review good sleep hygiene tips.    Other: -Provides group and verbal instruction on various topics (see comments)    Knowledge Questionnaire Score: Knowledge Questionnaire Score - 10/11/18 1501      Knowledge Questionnaire Score   Pre Score  14/18        Core Components/Risk Factors/Patient Goals at Admission: Personal Goals and Risk Factors at Admission - 10/11/18 1457      Core Components/Risk Factors/Patient Goals on Admission    Weight Management  Yes;Weight Loss    Intervention  Weight Management: Develop a combined nutrition and exercise program designed to reach desired caloric intake, while maintaining appropriate intake of nutrient and fiber, sodium and fats, and appropriate energy expenditure required for the weight goal.;Weight Management: Provide education and appropriate resources to help participant work on and attain dietary goals.    Admit Weight  210 lb 1.6 oz (95.3 kg)    Goal Weight: Short Term  200 lb (90.7 kg)    Goal Weight: Long Term  190 lb (86.2 kg)    Expected Outcomes  Short Term: Continue to assess and modify interventions until short term weight is achieved;Long Term: Adherence to nutrition and physical activity/exercise program aimed toward attainment of established weight goal    Intervention  Provide education on lifestyle modifcations including regular physical activity/exercise, weight management, moderate sodium restriction and increased consumption of fresh fruit, vegetables, and low fat dairy, alcohol  moderation, and smoking cessation.;Monitor prescription use compliance.    Expected Outcomes  Short Term: Continued assessment and intervention until BP is < 140/94m HG in hypertensive participants. < 130/821mHG in hypertensive participants with diabetes, heart failure or chronic kidney disease.;Long Term: Maintenance of blood pressure at goal levels.    Intervention  Provide education and support for participant on nutrition & aerobic/resistive exercise along with prescribed medications to achieve LDL '70mg'$ , HDL >'40mg'$ .    Expected Outcomes  Short Term: Participant states understanding of desired cholesterol values and is compliant with medications prescribed. Participant is following exercise prescription and nutrition guidelines.;Long Term: Cholesterol controlled with medications as prescribed, with individualized exercise RX and with personalized nutrition plan. Value goals: LDL < '70mg'$ , HDL > 40 mg.       Core Components/Risk Factors/Patient Goals Review:  Goals and Risk Factor Review    Row Name 10/29/18 1535             Core Components/Risk Factors/Patient Goals Review   Personal Goals Review  Weight Management/Obesity;Hypertension;Lipids       Review  MiSharyn Lulls doing well in weight.  Today she was up 2lbs but has started her cycle this week too, but no increased SOB.  Blood pressures are doing good, but she does not check at home as she does not have a cuff.  If she is out at a pharmacy she will get it checked.  She is doing well with her medications.       Expected Outcomes  Short: Continue to work on [employer]  loss.  Long: Continue to montior risk factors.          Core Components/Risk Factors/Patient Goals at Discharge (Final Review):  Goals and Risk Factor Review - 10/29/18 1535      Core Components/Risk Factors/Patient Goals Review   Personal Goals Review  Weight Management/Obesity;Hypertension;Lipids    Review  [name] is doing well in weight.  Today she was up 2lbs but has  started her cycle this week too, but no increased SOB.  Blood pressures are doing good, but she does not check at home as she does not have a cuff.  If she is out at a pharmacy she will get it checked.  She is doing well with her medications.    Expected Outcomes  Short: Continue to work on weight loss.  Long: Continue to montior risk factors.       ITP Comments: ITP Comments    Row Name 09/24/18 1751 10/11/18 1444 10/17/18 1422 11/07/18 1240     ITP Comments  Virtual Orientation completed today for Pulmonary Rehab. EP/RD appt on 8/26  Documentation for diagnosis can be found in [place] 5/28 and 8/11  Initial 6MWT and nutrition consult completed. ITp created and sent to Dr Sabra Heck to review and sign.  First full day of exercise!  Patient was oriented to gym and equipment including functions, settings, policies, and procedures.  Patient's individual exercise prescription and treatment plan were reviewed.  All starting workloads were established based on the results of the 6 minute walk test done at initial orientation visit.  The plan for exercise progression was also introduced and progression will be customized based on patient's performance and goals.  30 day review completed. ITP sent to Dr. Emily Filbert, Medical Director of Cardiac and Pulmonary Rehab. Continue with ITP unless changes are made by physician.  Department closed starting 10/2 until further notice by infection prevention and Health at Work teams for [place].       Comments: 30 day review

## 2018-11-07 NOTE — Procedures (Signed)
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 Fundoplication surgery followed. GERD (gastroesophageal reflux disease), kidney stones, Hyperlipidemia, Hypertension, and Pneumonia.  She reports excessive daytime sleepiness. FSS was 60-63. Cyclic hypersomnia alternating with insomnia, first treated with Lunesta, followed by gabapentin-tid regimen for neuropathy- this makes her too sleepy to function.                  Summary & Diagnosis:    Severe sleep apnea at AHI 37.5/h. but with an unusual distribution due to accentuation in NREM sleep. This can be reflecting central sleep apnea. No tachy-bradycardia and no hypoxemia were noted.  The short recording time is consistent with Insomnia. Reasons of insomnia beyond hear rate, oxygen saturation and breathing pattern are not reflected in a HST.    Recommendations:     I would certainly want to treat this Sleep apnea ASAP. I am concerned about possible central sleep apnea and will ask for an in lab titration. Should this not be authorized, we will need to start with an autotitration capable CPAP 6-16 cm water, 2cm EPR, heated humidity and mask of patient's choice. Followed by a RV to further investigate causes of insomnia.   Electronically Signed: Larey Seat, MD  11-07-2018                Sleep Summary  Oxygen Saturation Statistics   Start Study Time: End Study Time: Total Recording Time:     10:17:57 PM 2:09:18 AM      3 h, 51 min  Total Sleep Time % REM of Sleep Time:  3 h, 14 min  24.1    Mean: 93 Minimum: 89 Maximum: 98  Mean of Desaturations Nadirs (%):   92  Oxygen Desaturation. %: 4-9 10-20 >20 Total  Events Number Total  35 100.0  0 0.0  0 0.0  35 100.0  Oxygen Saturation: <90 <=88 <85 <80 <70  Duration (minutes): Sleep % 0.3 0.0 0.1 0.0 0.0 0.0 0.0 0.0 0.0 0.0     Respiratory Indices      Total Events REM NREM All Night  pRDI:  115  pAHI:  115 ODI:  35  pAHIc:  8  %  CSR: 0.0 25.7 25.7 11.5 2.6 41.8 41.8 11.4 2.6 37.7 37.7 11.5 2.6       Pulse Rate Statistics during Sleep (BPM)      Mean: 65 Minimum: 55 Maximum: 93    Indices are calculated using technically valid sleep time of  3 hrs, 3 min.    Wake  REM  L Sleep   D Sleep         Body Position Statistics  Position Supine Prone Right Left Non-Supine  Sleep (min) 0.0 0.0 1.0 193.8 194.8  Sleep % 0.0 0.0 0.5 99.5 100.0  pRDI N/A N/A N/A 37.7  37.7  pAHI N/A N/A N/A 37.7 37.7  ODI N/A N/A N/A 11.5 11.5     Snoring Statistics Snoring Level (dB) >40 >50 >60 >70 >80 >Threshold (45)  Sleep (min) 112.2 35.0 0.9 0.5 0.0 66.5  Sleep % 57.6 18.0 0.5 0.2 0.0 34.1    Mean: 44 dB

## 2018-11-08 ENCOUNTER — Telehealth: Payer: Self-pay

## 2018-11-08 ENCOUNTER — Ambulatory Visit: Payer: BC Managed Care – PPO

## 2018-11-08 ENCOUNTER — Telehealth: Payer: Self-pay | Admitting: Neurology

## 2018-11-08 DIAGNOSIS — G4730 Sleep apnea, unspecified: Secondary | ICD-10-CM

## 2018-11-08 DIAGNOSIS — R0683 Snoring: Secondary | ICD-10-CM

## 2018-11-08 DIAGNOSIS — K440 Diaphragmatic hernia with obstruction, without gangrene: Secondary | ICD-10-CM

## 2018-11-08 DIAGNOSIS — R0602 Shortness of breath: Secondary | ICD-10-CM

## 2018-11-08 NOTE — Telephone Encounter (Signed)
[employer] will deny cpap titrations due to no central apnea on HST and no comorbidity.  Patient will need an Auto cpap ordered then get a download to see if she developes centrals.

## 2018-11-08 NOTE — Telephone Encounter (Signed)
Pt returned call. I advised pt that Dr. Brett Fairy reviewed their sleep study results and found that pt has sleep apnea. Dr. Brett Fairy recommends that pt starts auto CPAP. I reviewed PAP compliance expectations with the pt. Pt is agreeable to starting a CPAP. I advised pt that an order will be sent to a DME, [facility], and [facility] will call the pt within about one week after they file with the pt's insurance. [facility] will show the pt how to use the machine, fit for masks, and troubleshoot the CPAP if needed. A follow up appt was made for insurance purposes with Ward Givens, NP  on Dec 3,2020 at 11:30 am. Pt verbalized understanding to arrive 15 minutes early and bring their CPAP. A letter with all of this information in it will be mailed to the pt as a reminder. I verified with the pt that the address we have on file is correct. Pt verbalized understanding of results. Pt had no questions at this time but was encouraged to call back if questions arise. I have sent the order to [facility] and have received confirmation that they have received the order.

## 2018-11-08 NOTE — Telephone Encounter (Signed)
-----   Message from Larey Seat, MD sent at 11/07/2018  2:02 PM EDT ----- She reports excessive daytime sleepiness. FSS was 60-63. Cyclic hypersomnia alternating with insomnia, first treated with Lunesta, followed by gabapentin-tid regimen for neuropathy- this makes her too sleepy to function.                  Summary & Diagnosis:   Severe sleep apnea at AHI 37.5/h. but with an unusual distribution due to accentuation in NREM sleep. This can be reflecting central sleep apnea. No tachy-bradycardia and no hypoxemia were noted.  The short recording time is consistent with Insomnia. Reasons of insomnia beyond hear rate, oxygen saturation and breathing pattern are not reflected in a HST.    Recommendations:    I would certainly want to treat this Sleep apnea ASAP. I am concerned about possible central sleep apnea and will ask for an in- lab- titration. Should this not be authorized, we will need to start with an autotitration capable CPAP 6-16 cm water, 2cm EPR, heated humidity and mask of patient's choice. Followed by a RV to further investigate causes of insomnia.   Electronically Signed: Larey Seat, MD  11-07-2018

## 2018-11-08 NOTE — Telephone Encounter (Signed)
Called patient to discuss sleep study results. No answer at this time. LVM for the patient to call back.    "[employer] will deny cpap titrations due to no central apnea on HST and no comorbidity.  Patient will need an Auto cpap ordered then get a download to see if she developes centrals. "

## 2018-11-10 NOTE — Telephone Encounter (Signed)
OK- ! Will put in an order.

## 2018-11-12 ENCOUNTER — Ambulatory Visit: Payer: BC Managed Care – PPO

## 2018-11-13 ENCOUNTER — Ambulatory Visit: Payer: BC Managed Care – PPO

## 2018-11-14 ENCOUNTER — Ambulatory Visit: Payer: BC Managed Care – PPO

## 2018-11-15 ENCOUNTER — Other Ambulatory Visit: Payer: Self-pay | Admitting: Adult Health

## 2018-11-15 ENCOUNTER — Ambulatory Visit: Payer: BC Managed Care – PPO

## 2018-11-15 DIAGNOSIS — G4700 Insomnia, unspecified: Secondary | ICD-10-CM

## 2018-11-19 ENCOUNTER — Encounter: Payer: BC Managed Care – PPO | Attending: Internal Medicine

## 2018-11-19 DIAGNOSIS — E785 Hyperlipidemia, unspecified: Secondary | ICD-10-CM | POA: Insufficient documentation

## 2018-11-19 DIAGNOSIS — Z803 Family history of malignant neoplasm of breast: Secondary | ICD-10-CM | POA: Insufficient documentation

## 2018-11-19 DIAGNOSIS — R0609 Other forms of dyspnea: Secondary | ICD-10-CM | POA: Insufficient documentation

## 2018-11-19 DIAGNOSIS — Z801 Family history of malignant neoplasm of trachea, bronchus and lung: Secondary | ICD-10-CM | POA: Insufficient documentation

## 2018-11-19 DIAGNOSIS — M1990 Unspecified osteoarthritis, unspecified site: Secondary | ICD-10-CM | POA: Insufficient documentation

## 2018-11-19 DIAGNOSIS — Z800 Family history of malignant neoplasm of digestive organs: Secondary | ICD-10-CM | POA: Insufficient documentation

## 2018-11-19 DIAGNOSIS — Z7982 Long term (current) use of aspirin: Secondary | ICD-10-CM | POA: Insufficient documentation

## 2018-11-19 DIAGNOSIS — Z791 Long term (current) use of non-steroidal anti-inflammatories (NSAID): Secondary | ICD-10-CM | POA: Insufficient documentation

## 2018-11-19 DIAGNOSIS — F419 Anxiety disorder, unspecified: Secondary | ICD-10-CM | POA: Insufficient documentation

## 2018-11-19 DIAGNOSIS — Z79899 Other long term (current) drug therapy: Secondary | ICD-10-CM | POA: Insufficient documentation

## 2018-11-19 DIAGNOSIS — K219 Gastro-esophageal reflux disease without esophagitis: Secondary | ICD-10-CM | POA: Insufficient documentation

## 2018-11-19 DIAGNOSIS — F329 Major depressive disorder, single episode, unspecified: Secondary | ICD-10-CM | POA: Insufficient documentation

## 2018-11-19 DIAGNOSIS — Z8042 Family history of malignant neoplasm of prostate: Secondary | ICD-10-CM | POA: Insufficient documentation

## 2018-11-23 DIAGNOSIS — G4733 Obstructive sleep apnea (adult) (pediatric): Secondary | ICD-10-CM | POA: Diagnosis not present

## 2018-11-29 ENCOUNTER — Telehealth: Payer: Self-pay

## 2018-11-29 NOTE — Telephone Encounter (Signed)
Samantha Greene has still been out with her aunt - she plans to return next week

## 2018-11-29 NOTE — Telephone Encounter (Signed)
LMOM

## 2018-12-03 ENCOUNTER — Encounter: Payer: Self-pay | Admitting: *Deleted

## 2018-12-04 DIAGNOSIS — R0609 Other forms of dyspnea: Secondary | ICD-10-CM

## 2018-12-04 NOTE — Progress Notes (Signed)
Unable to complete nutrition 30-day re-eval cycle due to inconsistent attendence 

## 2018-12-05 ENCOUNTER — Telehealth: Payer: Self-pay | Admitting: *Deleted

## 2018-12-05 ENCOUNTER — Encounter: Payer: Self-pay | Admitting: *Deleted

## 2018-12-05 DIAGNOSIS — R0609 Other forms of dyspnea: Secondary | ICD-10-CM

## 2018-12-05 NOTE — Telephone Encounter (Signed)
[name] called to say she is still watching her aunt and can't come to class. Her sessions were put on hold and she will call when she can return to Pulmonary Rehab.

## 2018-12-05 NOTE — Progress Notes (Signed)
Pulmonary Individual Treatment Plan  Patient Details  Name: Samantha Greene MRN: [id number] Date of Birth: Jun 11, 1970 Referring Provider:     Pulmonary Rehab from 10/11/2018 in [facility] Cardiac and Pulmonary Rehab  Referring Provider  McKeown      Initial Encounter Date:    Pulmonary Rehab from 10/11/2018 in [facility] Cardiac and Pulmonary Rehab  Date  10/11/18      Visit Diagnosis: DOE (dyspnea on exertion)  Patient's Home Medications on Admission:  Current Outpatient Medications:  .  acetaminophen (TYLENOL) 500 MG tablet, Take 2 tablets (1,000 mg total) by mouth every 6 (six) hours as needed., Disp: 30 tablet, Rfl: 0 .  albuterol (VENTOLIN HFA) 108 (90 Base) MCG/ACT inhaler, Inhale 2 puffs into the lungs every 4 (four) hours as needed for wheezing or shortness of breath., Disp: 1 Inhaler, Rfl: 0 .  aspirin 81 MG tablet, Take 81 mg by mouth daily., Disp: , Rfl:  .  baclofen (LIORESAL) 10 MG tablet, Take 1/2 to 1 tablet 1 or 2 x /day as needed for Muscle Spasms, Disp: 180 tablet, Rfl: 1 .  buPROPion (WELLBUTRIN XL) 300 MG 24 hr tablet, Take 1 tablet every Morning for Mood, Focus & Concentration, Disp: 90 tablet, Rfl: 3 .  cetirizine (ZYRTEC) 10 MG tablet, Take 10 mg by mouth at bedtime., Disp: , Rfl:  .  Cholecalciferol (VITAMIN D) 125 MCG (5000 UT) CAPS, Take 5,000 Units by mouth daily. , Disp: , Rfl:  .  escitalopram (LEXAPRO) 20 MG tablet, TAKE 1 TABLET BY MOUTH EVERY DAY, Disp: 90 tablet, Rfl: 1 .  Eszopiclone 3 MG TABS, TAKE 1 TABLET BY MOUTH AT BEDTIME AS NEEDED. TAKE IMMEDIATELY BEFORE BEDTIME, Disp: 30 tablet, Rfl: 3 .  fluocinolone (SYNALAR) 0.025 % ointment, APPLY TO AFFECTED AREA TWICE A DAY, Disp: 30 g, Rfl: 0 .  fluticasone (FLONASE) 50 MCG/ACT nasal spray, Place 2 sprays into both nostrils daily., Disp: 16 g, Rfl: 2 .  gabapentin (NEURONTIN) 300 MG capsule, Take 1 capsule (300 mg total) by mouth 3 (three) times daily. (Patient taking differently: Take 300 mg by mouth 3 (three)  times daily. Patient taking BID), Disp: 90 capsule, Rfl: 2 .  ibuprofen (ADVIL,MOTRIN) 800 MG tablet, Take 1 tablet (800 mg total) by mouth every 8 (eight) hours as needed (breakthrough pain)., Disp: 30 tablet, Rfl: 0 .  meloxicam (MOBIC) 15 MG tablet, Take 1/2 to 1 tablet Daily for Pain & Inflammation & limit to 4-5 tablets  /week to Avoid Kidney Damage, Disp: 90 tablet, Rfl: 0 .  montelukast (SINGULAIR) 10 MG tablet, Take 1 tablet (10 mg total) by mouth at bedtime. Take 1 tablet daily for Allergies, Disp: 90 tablet, Rfl: 3 .  nystatin cream (MYCOSTATIN), Apply 1 application topically 2 (two) times daily. (Patient taking differently: Apply 1 application topically 2 (two) times daily as needed for dry skin. ), Disp: 30 g, Rfl: 1 .  omeprazole (PRILOSEC) 40 MG capsule, TAKE 1 CAPSULE BY MOUTH EVERY DAY, Disp: 90 capsule, Rfl: 0 .  rosuvastatin (CRESTOR) 5 MG tablet, Take 1 tablet (5 mg total) by mouth at bedtime., Disp: 90 tablet, Rfl: 3 .  SPRINTEC 28 0.25-35 MG-MCG tablet, TAKE 1 TABLET BY MOUTH EVERY DAY, Disp: 84 tablet, Rfl: 0 .  telmisartan (MICARDIS) 40 MG tablet, Take 1 tablet Daily for BP, Disp: 90 tablet, Rfl: 1 .  triamcinolone ointment (KENALOG) 0.1 %, Apply 1 application topically 2 (two) times daily. (Patient taking differently: Apply 1 application topically daily as  needed (rash). ), Disp: 80 g, Rfl: 1 .  vitamin C (ASCORBIC ACID) 500 MG tablet, Take 500 mg by mouth daily., Disp: , Rfl:  .  vitamin E 600 UNIT capsule, Take 600 Units by mouth daily., Disp: , Rfl:   Past Medical History: Past Medical History:  Diagnosis Date  . Anxiety   . Arthritis   . Depression    no meds  . Dyspnea    can happen anytime  . Family history of breast cancer   . Family history of colon cancer   . Family history of lung cancer   . Family history of pancreatic cancer   . Family history of prostate cancer   . GERD (gastroesophageal reflux disease)   . Hernia, diaphragmatic   . History of kidney  stones   . Hyperlipidemia   . Hypertension    no meds  . Pneumonia    1 time    Tobacco Use: Social History   Tobacco Use  Smoking Status Never Smoker  Smokeless Tobacco Never Used    Labs: Recent Review Flowsheet Data    Labs for ITP Cardiac and Pulmonary Rehab Latest Ref Rng & Units 10/12/2017 03/08/2018 03/21/2018 03/24/2018 09/11/2018   Cholestrol <200 mg/dL 241(H) 210(H) - - 226(H)   LDLCALC mg/dL (calc) 118(H) 108(H) - - 132(H)   HDL > OR = 50 mg/dL 89 76 - - 62   Trlycerides <150 mg/dL 216(H) 150(H) - - 188(H)   Hemoglobin A1c <5.7 % of total Hgb 5.3 - - - 5.6   PHART 7.350 - 7.450 - - 7.427 7.361 -   PCO2ART 32.0 - 48.0 mmHg - - 35.9 45.9 -   HCO3 20.0 - 28.0 mmol/L - - 23.2 25.4 -   ACIDBASEDEF 0.0 - 2.0 mmol/L - - 0.6 - -   O2SAT % - - 96.8 96.7 -       Pulmonary Assessment Scores: Pulmonary Assessment Scores    Row Name 10/11/18 1459         ADL UCSD   Rest  1     Walk  3     Stairs  5     Bath  1     Dress  2     Shop  3       CAT Score   CAT Score  25       mMRC Score   mMRC Score  2        UCSD: Self-administered rating of dyspnea associated with activities of daily living (ADLs) 6-point scale (0 = "not at all" to 5 = "maximal or unable to do because of breathlessness")  Scoring Scores range from 0 to 120.  Minimally important difference is 5 units  CAT: CAT can identify the health impairment of COPD patients and is better correlated with disease progression.  CAT has a scoring range of zero to 40. The CAT score is classified into four groups of low (less than 10), medium (10 - 20), high (21-30) and very high (31-40) based on the impact level of disease on health status. A CAT score over 10 suggests significant symptoms.  A worsening CAT score could be explained by an exacerbation, poor medication adherence, poor inhaler technique, or progression of COPD or comorbid conditions.  CAT MCID is 2 points  mMRC: mMRC (Modified Medical Research  Council) Dyspnea Scale is used to assess the degree of baseline functional disability in patients of respiratory disease due to dyspnea. No minimal important  difference is established. A decrease in score of 1 point or greater is considered a positive change.   Pulmonary Function Assessment:   Exercise Target Goals: Exercise Program Goal: Individual exercise prescription set using results from initial 6 min walk test and THRR while considering  patient's activity barriers and safety.   Exercise Prescription Goal: Initial exercise prescription builds to 30-45 minutes a day of aerobic activity, 2-3 days per week.  Home exercise guidelines will be given to patient during program as part of exercise prescription that the participant will acknowledge.  Activity Barriers & Risk Stratification: Activity Barriers & Cardiac Risk Stratification - 09/24/18 1735      Activity Barriers & Cardiac Risk Stratification   Activity Barriers  Other (comment)    Comments  weak ankles  panter facitis, muscle spasm neck and back       6 Minute Walk: 6 Minute Walk    Row Name 10/11/18 1445         6 Minute Walk   Phase  Initial     Distance  1300 feet     Walk Time  6 minutes     # of Rest Breaks  0     MPH  2.46     METS  3.48     RPE  13     Perceived Dyspnea   3     VO2 Peak  12.2     Symptoms  No     Resting HR  84 bpm     Resting BP  122/74     Resting Oxygen Saturation   98 %     Exercise Oxygen Saturation  during 6 min walk  90 %     Max Ex. HR  107 bpm     Max Ex. BP  138/84     2 Minute Post BP  126/78       Interval HR   1 Minute HR  92     2 Minute HR  103     3 Minute HR  106     4 Minute HR  107     5 Minute HR  103     6 Minute HR  103     2 Minute Post HR  88     Interval Heart Rate?  Yes       Interval Oxygen   Interval Oxygen?  Yes     Baseline Oxygen Saturation %  98 %     1 Minute Oxygen Saturation %  91 %     1 Minute Liters of Oxygen  0 L     2 Minute Oxygen  Saturation %  90 %     2 Minute Liters of Oxygen  0 L     3 Minute Oxygen Saturation %  91 %     3 Minute Liters of Oxygen  0 L     4 Minute Oxygen Saturation %  93 %     4 Minute Liters of Oxygen  0 L     5 Minute Oxygen Saturation %  92 %     5 Minute Liters of Oxygen  0 L     6 Minute Oxygen Saturation %  93 %     6 Minute Liters of Oxygen  0 L     2 Minute Post Oxygen Saturation %  98 %     2 Minute Post Liters of Oxygen  0 L  Oxygen Initial Assessment: Oxygen Initial Assessment - 10/17/18 1423      Home Oxygen   Home Oxygen Device  None    Sleep Oxygen Prescription  None    Home Exercise Oxygen Prescription  None    Home at Rest Exercise Oxygen Prescription  None      Initial 6 min Walk   Oxygen Used  None      Program Oxygen Prescription   Program Oxygen Prescription  None      Intervention   Short Term Goals  To learn and exhibit compliance with exercise, home and travel O2 prescription;To learn and understand importance of monitoring SPO2 with pulse oximeter and demonstrate accurate use of the pulse oximeter.;To learn and understand importance of maintaining oxygen saturations>88%;To learn and demonstrate proper pursed lip breathing techniques or other breathing techniques.;To learn and demonstrate proper use of respiratory medications    Long  Term Goals  Exhibits compliance with exercise, home and travel O2 prescription;Verbalizes importance of monitoring SPO2 with pulse oximeter and return demonstration;Maintenance of O2 saturations>88%;Exhibits proper breathing techniques, such as pursed lip breathing or other method taught during program session;Compliance with respiratory medication;Demonstrates proper use of MDI's       Oxygen Re-Evaluation: Oxygen Re-Evaluation    Row Name 10/17/18 1424 10/29/18 1537           Program Oxygen Prescription   Program Oxygen Prescription  None  None        Home Oxygen   Home Oxygen Device  None  None      Sleep Oxygen  Prescription  None  None      Home Exercise Oxygen Prescription  None  None      Home at Rest Exercise Oxygen Prescription  None  None        Goals/Expected Outcomes   Short Term Goals  To learn and exhibit compliance with exercise, home and travel O2 prescription;To learn and understand importance of monitoring SPO2 with pulse oximeter and demonstrate accurate use of the pulse oximeter.;To learn and understand importance of maintaining oxygen saturations>88%;To learn and demonstrate proper pursed lip breathing techniques or other breathing techniques.;To learn and demonstrate proper use of respiratory medications  To learn and exhibit compliance with exercise, home and travel O2 prescription;To learn and understand importance of monitoring SPO2 with pulse oximeter and demonstrate accurate use of the pulse oximeter.;To learn and understand importance of maintaining oxygen saturations>88%;To learn and demonstrate proper pursed lip breathing techniques or other breathing techniques.;To learn and demonstrate proper use of respiratory medications      Long  Term Goals  Exhibits compliance with exercise, home and travel O2 prescription;Verbalizes importance of monitoring SPO2 with pulse oximeter and return demonstration;Maintenance of O2 saturations>88%;Exhibits proper breathing techniques, such as pursed lip breathing or other method taught during program session;Compliance with respiratory medication;Demonstrates proper use of MDI's  Exhibits compliance with exercise, home and travel O2 prescription;Verbalizes importance of monitoring SPO2 with pulse oximeter and return demonstration;Maintenance of O2 saturations>88%;Exhibits proper breathing techniques, such as pursed lip breathing or other method taught during program session;Compliance with respiratory medication;Demonstrates proper use of MDI's      Comments  Reviewed PLB technique with pt.  Talked about how it work and it's important to maintaining his  exercise saturations.  She uses her inhaler as needed.  She is doing a sleep study tonight.  She has been using her PLB at home and finds it helpful.  She will keep [place] updated on her sleep study.  Goals/Expected Outcomes  Short: Become more profiecient at using PLB.   Long: Become independent at using PLB.  Short: Continue to use PLB.  Long: Continued compliance.         Oxygen Discharge (Final Oxygen Re-Evaluation): Oxygen Re-Evaluation - 10/29/18 1537      Program Oxygen Prescription   Program Oxygen Prescription  None      Home Oxygen   Home Oxygen Device  None    Sleep Oxygen Prescription  None    Home Exercise Oxygen Prescription  None    Home at Rest Exercise Oxygen Prescription  None      Goals/Expected Outcomes   Short Term Goals  To learn and exhibit compliance with exercise, home and travel O2 prescription;To learn and understand importance of monitoring SPO2 with pulse oximeter and demonstrate accurate use of the pulse oximeter.;To learn and understand importance of maintaining oxygen saturations>88%;To learn and demonstrate proper pursed lip breathing techniques or other breathing techniques.;To learn and demonstrate proper use of respiratory medications    Long  Term Goals  Exhibits compliance with exercise, home and travel O2 prescription;Verbalizes importance of monitoring SPO2 with pulse oximeter and return demonstration;Maintenance of O2 saturations>88%;Exhibits proper breathing techniques, such as pursed lip breathing or other method taught during program session;Compliance with respiratory medication;Demonstrates proper use of MDI's    Comments  She uses her inhaler as needed.  She is doing a sleep study tonight.  She has been using her PLB at home and finds it helpful.  She will keep [place] updated on her sleep study.    Goals/Expected Outcomes  Short: Continue to use PLB.  Long: Continued compliance.       Initial Exercise Prescription: Initial Exercise Prescription -  10/11/18 1400      Date of Initial Exercise RX and Referring Provider   Date  10/11/18    Referring Provider  McKeown      Treadmill   MPH  2.4    Grade  0.5    Minutes  15    METs  3      Recumbant Bike   Level  3    RPM  60    Watts  45    Minutes  15    METs  3      Elliptical   Level  1    Speed  3    Minutes  15      Biostep-RELP   Level  3    SPM  50    Minutes  15    METs  3      Prescription Details   Frequency (times per week)  3    Duration  Progress to 30 minutes of continuous aerobic without signs/symptoms of physical distress      Intensity   THRR 40-80% of Max Heartrate  119-154    Ratings of Perceived Exertion  11-15    Perceived Dyspnea  0-4      Resistance Training   Training Prescription  Yes    Weight  3 lb    Reps  10-15       Perform Capillary Blood Glucose checks as needed.  Exercise Prescription Changes: Exercise Prescription Changes    Row Name 10/11/18 1400 10/29/18 1600 11/14/18 1000         Response to Exercise   Blood Pressure (Admit)  122/74  -  134/56     Blood Pressure (Exercise)  138/84  -  138/70  Blood Pressure (Exit)  126/78  -  118/70     Heart Rate (Admit)  84 bpm  -  78 bpm     Heart Rate (Exercise)  107 bpm  -  134 bpm     Heart Rate (Exit)  88 bpm  -  95 bpm     Oxygen Saturation (Admit)  98 %  -  95 %     Oxygen Saturation (Exercise)  90 %  -  94 %     Oxygen Saturation (Exit)  98 %  -  93 %     Rating of Perceived Exertion (Exercise)  13  -  15     Perceived Dyspnea (Exercise)  3  -  1     Symptoms  none  -  none     Comments  orientation  -  ellipitcal is tough     Duration  -  -  Continue with 30 min of aerobic exercise without signs/symptoms of physical distress.     Intensity  -  -  THRR unchanged       Progression   Progression  -  -  Continue to progress workloads to maintain intensity without signs/symptoms of physical distress.     Average METs  -  -  3.2       Resistance Training    Training Prescription  -  -  Yes     Weight  -  -  3 lbs     Reps  -  -  10-15       Interval Training   Interval Training  -  -  No       Treadmill   MPH  -  -  2.5     Grade  -  -  2     Minutes  -  -  15     METs  -  -  3.6       Recumbant Bike   Level  -  -  3     Watts  -  -  29     Minutes  -  -  15     METs  -  -  3.05       Elliptical   Level  -  -  1     Speed  -  -  3     Minutes  -  -  15       Biostep-RELP   Level  -  -  3     Minutes  -  -  15     METs  -  -  3       Home Exercise Plan   Plans to continue exercise at  -  Home (comment) walking, videos  Home (comment) walking, videos     Frequency  -  Add 2 additional days to program exercise sessions.  Add 2 additional days to program exercise sessions.     Initial Home Exercises Provided  -  10/29/18  10/29/18        Exercise Comments:   Exercise Goals and Review: Exercise Goals    Row Name 10/11/18 1506             Exercise Goals   Increase Physical Activity  Yes       Intervention  Provide advice, education, support and counseling about physical activity/exercise needs.;Develop an individualized exercise prescription for aerobic and resistive training based on  initial evaluation findings, risk stratification, comorbidities and participant's personal goals.       Expected Outcomes  Short Term: Attend rehab on a regular basis to increase amount of physical activity.;Long Term: Add in home exercise to make exercise part of routine and to increase amount of physical activity.;Long Term: Exercising regularly at least 3-5 days a week.       Increase Strength and Stamina  Yes       Intervention  Provide advice, education, support and counseling about physical activity/exercise needs.;Develop an individualized exercise prescription for aerobic and resistive training based on initial evaluation findings, risk stratification, comorbidities and participant's personal goals.       Expected Outcomes  Short  Term: Increase workloads from initial exercise prescription for resistance, speed, and METs.;Short Term: Perform resistance training exercises routinely during rehab and add in resistance training at home;Long Term: Improve cardiorespiratory fitness, muscular endurance and strength as measured by increased METs and functional capacity (6MWT)       Able to understand and use rate of perceived exertion (RPE) scale  Yes       Intervention  Provide education and explanation on how to use RPE scale       Expected Outcomes  Short Term: Able to use RPE daily in rehab to express subjective intensity level;Long Term:  Able to use RPE to guide intensity level when exercising independently       Able to understand and use Dyspnea scale  Yes       Intervention  Provide education and explanation on how to use Dyspnea scale       Expected Outcomes  Short Term: Able to use Dyspnea scale daily in rehab to express subjective sense of shortness of breath during exertion;Long Term: Able to use Dyspnea scale to guide intensity level when exercising independently       Knowledge and understanding of Target Heart Rate Range (THRR)  Yes       Intervention  Provide education and explanation of THRR including how the numbers were predicted and where they are located for reference       Expected Outcomes  Short Term: Able to state/look up THRR;Short Term: Able to use daily as guideline for intensity in rehab;Long Term: Able to use THRR to govern intensity when exercising independently       Able to check pulse independently  Yes       Intervention  Provide education and demonstration on how to check pulse in carotid and radial arteries.;Review the importance of being able to check your own pulse for safety during independent exercise       Expected Outcomes  Short Term: Able to explain why pulse checking is important during independent exercise;Long Term: Able to check pulse independently and accurately       Understanding of  Exercise Prescription  Yes       Intervention  Provide education, explanation, and written materials on patient's individual exercise prescription       Expected Outcomes  Short Term: Able to explain program exercise prescription;Long Term: Able to explain home exercise prescription to exercise independently          Exercise Goals Re-Evaluation : Exercise Goals Re-Evaluation    Row Name 10/11/18 1506 10/17/18 1422 10/29/18 1606 11/13/18 1541       Exercise Goal Re-Evaluation   Exercise Goals Review  Increase Strength and Stamina;Able to understand and use rate of perceived exertion (RPE) scale;Able to understand and use Dyspnea scale;Knowledge and  understanding of Target Heart Rate Range (THRR);Able to check pulse independently;Understanding of Exercise Prescription  Increase Physical Activity;Able to understand and use rate of perceived exertion (RPE) scale;Knowledge and understanding of Target Heart Rate Range (THRR);Understanding of Exercise Prescription;Increase Strength and Stamina;Able to understand and use Dyspnea scale;Able to check pulse independently  Increase Physical Activity;Able to understand and use rate of perceived exertion (RPE) scale;Knowledge and understanding of Target Heart Rate Range (THRR);Understanding of Exercise Prescription;Increase Strength and Stamina;Able to understand and use Dyspnea scale;Able to check pulse independently  Increase Physical Activity;Increase Strength and Stamina;Understanding of Exercise Prescription    Comments  -  Reviewed RPE scale, THR and program prescription with pt today.  Pt voiced understanding and was given a copy of goals to take home.  Samantha Greene is off to a good start in rehab.  She wants to go ahead and start walking more on her off days.  Reviewed home exercise with pt today.  Pt plans to walk and use staff videos at home for exercise.  Reviewed THR, pulse, RPE, sign and symptoms, and when to call 911 or MD.  Also discussed weather  considerations and indoor options.  Pt voiced understanding.  Samantha Greene has been doing well in rehab.  She is working hard to [employer] the elliptical and is determined to get to her full 15 min!  We will continue to monitor her progression.    Expected Outcomes  -  Short: Use RPE daily to regulate intensity. Long: Follow program prescription in THR.  Short: Add in exercise on off days from rehab.  Long: Continue to improve stamina.  Short: Return to rehab regularly  Long: Continue to improve stamina and exercise for  mental boost       Discharge Exercise Prescription (Final Exercise Prescription Changes): Exercise Prescription Changes - 11/14/18 1000      Response to Exercise   Blood Pressure (Admit)  134/56    Blood Pressure (Exercise)  138/70    Blood Pressure (Exit)  118/70    Heart Rate (Admit)  78 bpm    Heart Rate (Exercise)  134 bpm    Heart Rate (Exit)  95 bpm    Oxygen Saturation (Admit)  95 %    Oxygen Saturation (Exercise)  94 %    Oxygen Saturation (Exit)  93 %    Rating of Perceived Exertion (Exercise)  15    Perceived Dyspnea (Exercise)  1    Symptoms  none    Comments  ellipitcal is tough    Duration  Continue with 30 min of aerobic exercise without signs/symptoms of physical distress.    Intensity  THRR unchanged      Progression   Progression  Continue to progress workloads to maintain intensity without signs/symptoms of physical distress.    Average METs  3.2      Resistance Training   Training Prescription  Yes    Weight  3 lbs    Reps  10-15      Interval Training   Interval Training  No      Treadmill   MPH  2.5    Grade  2    Minutes  15    METs  3.6      Recumbant Bike   Level  3    Watts  29    Minutes  15    METs  3.05      Elliptical   Level  1    Speed  3  Minutes  15      Biostep-RELP   Level  3    Minutes  15    METs  3      Home Exercise Plan   Plans to continue exercise at  Home (comment)   walking, videos   Frequency  Add 2  additional days to program exercise sessions.    Initial Home Exercises Provided  10/29/18       Nutrition:  Target Goals: Understanding of nutrition guidelines, daily intake of sodium '1500mg'$ , cholesterol '200mg'$ , calories 30% from fat and 7% or less from saturated fats, daily to have 5 or more servings of fruits and vegetables.  Biometrics: Pre Biometrics - 10/11/18 1453      Pre Biometrics   Height  5' 0.5" (1.537 m)    Weight  210 lb 1.6 oz (95.3 kg)    BMI (Calculated)  40.34    Single Leg Stand  10.6 seconds        Nutrition Therapy Plan and Nutrition Goals: Nutrition Therapy & Goals - 10/11/18 1436      Nutrition Therapy   Diet  Low Na, HH diet    Protein (specify units)  75-80g    Fiber  25 grams    Whole Grain Foods  3 servings    Saturated Fats  12 max. grams    Fruits and Vegetables  5 servings/day    Sodium  1.5 grams      Personal Nutrition Goals   Nutrition Goal  ST: Pt wants to try hello fresh LT: Weight loss 50lb (5-10lbs now)    Comments  Pt reports not eating that much during the day. B: cheerios with 2% milk. L/D: (2 meals per day) usually [employer] or another kind of take out. For a snack sometimes she will have airpopped popcorn with her own seasoning. pt reports not cooking and having difficulty with motivation, pt reports hx of depression. Discussed HH eating, mindful eating/intuitive eating, food anthropologist (no judgement), weight loss expectations, and about her support system.      Intervention Plan   Intervention  Prescribe, educate and counsel regarding individualized specific dietary modifications aiming towards targeted core components such as weight, hypertension, lipid management, diabetes, heart failure and other comorbidities.;Nutrition handout(s) given to patient.    Expected Outcomes  Short Term Goal: Understand basic principles of dietary content, such as calories, fat, sodium, cholesterol and nutrients.;Short Term Goal: A plan has been  developed with personal nutrition goals set during dietitian appointment.;Long Term Goal: Adherence to prescribed nutrition plan.       Nutrition Assessments: Nutrition Assessments - 10/11/18 1459      MEDFICTS Scores   Pre Score  16       Nutrition Goals Re-Evaluation: Nutrition Goals Re-Evaluation    [place] Name 10/22/18 1444             Goals   Nutrition Goal  ST: Pt wants to try hello fresh LT: Weight loss 50lb (5-10lbs now)       Comment  Pt reports she and her husband have had a lot going on and she will get hello fresh within the next two weeks. Pt reports otherwise doing ok, talking with her doctor about therapy.       Expected Outcome  ST: Pt wants to try hello fresh LT: Weight loss 50lb (5-10lbs now)          Nutrition Goals Discharge (Final Nutrition Goals Re-Evaluation): Nutrition Goals Re-Evaluation - 10/22/18 1444  Goals   Nutrition Goal  ST: Pt wants to try hello fresh LT: Weight loss 50lb (5-10lbs now)    Comment  Pt reports she and her husband have had a lot going on and she will get hello fresh within the next two weeks. Pt reports otherwise doing ok, talking with her doctor about therapy.    Expected Outcome  ST: Pt wants to try hello fresh LT: Weight loss 50lb (5-10lbs now)       Psychosocial: Target Goals: Acknowledge presence or absence of significant depression and/or stress, maximize coping skills, provide positive support system. Participant is able to verbalize types and ability to use techniques and skills needed for reducing stress and depression.   Initial Review & Psychosocial Screening: Initial Psych Review & Screening - 09/24/18 1737      Initial Review   Current issues with  Current Psychotropic Meds;Current Sleep Concerns;Current Depression;Current Stress Concerns    Comments  Stress is part of her nature      Family Dynamics   Good Support System?  Yes   Good friends,Husband, Twin Brother , Aunt     Barriers   Psychosocial  barriers to participate in program  There are no identifiable barriers or psychosocial needs.;The patient should benefit from training in stress management and relaxation.      Screening Interventions   Interventions  Encouraged to exercise;To provide support and resources with identified psychosocial needs;Provide feedback about the scores to participant    Expected Outcomes  Short Term goal: Utilizing psychosocial counselor, staff and physician to assist with identification of specific Stressors or current issues interfering with healing process. Setting desired goal for each stressor or current issue identified.;Long Term Goal: Stressors or current issues are controlled or eliminated.;Short Term goal: Identification and review with participant of any Quality of Life or Depression concerns found by scoring the questionnaire.;Long Term goal: The participant improves quality of Life and PHQ9 Scores as seen by post scores and/or verbalization of changes       Quality of Life Scores: Quality of Life - 10/11/18 1458      Quality of Life   Select  Quality of Life      Scores of 19 and below usually indicate a poorer quality of life in these areas.  A difference of  2-3 points is a clinically meaningful difference.  A difference of 2-3 points in the total score of the Quality of Life Index has been associated with significant improvement in overall quality of life, self-image, physical symptoms, and general health in studies assessing change in quality of life.  PHQ-9: Recent Review Flowsheet Data    Depression screen [facility] 2/9 10/29/2018   Decreased Interest 3   Down, Depressed, Hopeless 3   PHQ - 2 Score 6   Altered sleeping 1   Tired, decreased energy 3   Change in appetite 3   Feeling bad or failure about yourself  3   Trouble concentrating 3   Moving slowly or fidgety/restless 2   Suicidal thoughts 0   PHQ-9 Score 21   Difficult doing work/chores Very difficult     Interpretation of  Total Score  Total Score Depression Severity:  1-4 = Minimal depression, 5-9 = Mild depression, 10-14 = Moderate depression, 15-19 = Moderately severe depression, 20-27 = Severe depression   Psychosocial Evaluation and Intervention: Psychosocial Evaluation - 10/11/18 1501      Psychosocial Evaluation & Interventions   Comments  Pt reports hx of depression with lack of  motivation to do things that interferes with her life and pt mentioned her medications have not seemed to work at all despite being on them for 4-5 monhts; encouraged her to speak to her [place]. Discussed how counseling/therapy may also be beneficial ; encouraged her to ask her PMD.       Psychosocial Re-Evaluation: Psychosocial Re-Evaluation    [place] Name 10/29/18 1529 11/13/18 1542           Psychosocial Re-Evaluation   Current issues with  Current Stress Concerns;Current Depression;Current Psychotropic Meds  Current Sleep Concerns      Comments  Samantha Greene is doing well in rehab. She continues to feel sad all the time, but does note that it is better on days she exercises, so she is going to try to exercise a little more often.  She feels that she puts on a happy face.  She is taking lexapro and wellbutrin.  The lexapro is newer.  The wellbutrin has been more long term and we talked about maybe talking with her doctor about uping her dosage. Completed PHQ9 questionnarie today.  She uses medications to sleep and is completing a sleep study tonight at home.  Samantha Greene completed her sleep study and has been diagnosed with sleep apnea.  She will start using a CPAP.      Expected Outcomes  Short: Talk to doctor about meds.  Long: Continue work on improving depression.  Short: CPAP compliance.  Long: Improved sleep quality.      Interventions  Stress management education;Encouraged to attend Pulmonary Rehabilitation for the exercise  -      Continue Psychosocial Services   Follow up required by staff  Follow up required by  staff      Comments  Stress is part of her nature  -         Psychosocial Discharge (Final Psychosocial Re-Evaluation): Psychosocial Re-Evaluation - 11/13/18 1542      Psychosocial Re-Evaluation   Current issues with  Current Sleep Concerns    Comments  Samantha Greene completed her sleep study and has been diagnosed with sleep apnea.  She will start using a CPAP.    Expected Outcomes  Short: CPAP compliance.  Long: Improved sleep quality.    Continue Psychosocial Services   Follow up required by staff       Education: Education Goals: Education classes will be provided on a weekly basis, covering required topics. Participant will state understanding/return demonstration of topics presented.  Learning Barriers/Preferences: Learning Barriers/Preferences - 09/24/18 1740      Learning Barriers/Preferences   Learning Barriers  Hearing   ADD,  Words not always trasmit correctly when hearing or speaking   Learning Preferences  None       Education Topics:  Initial Evaluation Education: - Verbal, written and demonstration of respiratory meds, oximetry and breathing techniques. Instruction on use of nebulizers and MDIs and importance of monitoring MDI activations.   General Nutrition Guidelines/Fats and Fiber: -Group instruction provided by verbal, written material, models and posters to present the general guidelines for heart healthy nutrition. Gives an explanation and review of dietary fats and fiber.   Controlling Sodium/Reading Food Labels: -Group verbal and written material supporting the discussion of sodium use in heart healthy nutrition. Review and explanation with models, verbal and written materials for utilization of the food label.   Exercise Physiology & General Exercise Guidelines: - Group verbal and written instruction with models to review the exercise physiology of the cardiovascular system and associated critical  values. Provides general exercise guidelines with  specific guidelines to those with heart or lung disease.    Aerobic Exercise & Resistance Training: - Gives group verbal and written instruction on the various components of exercise. Focuses on aerobic and resistive training programs and the benefits of this training and how to safely progress through these programs.   Flexibility, Balance, Mind/Body Relaxation: Provides group verbal/written instruction on the benefits of flexibility and balance training, including mind/body exercise modes such as yoga, pilates and tai chi.  Demonstration and skill practice provided.   Stress and Anxiety: - Provides group verbal and written instruction about the health risks of elevated stress and causes of high stress.  Discuss the correlation between heart/lung disease and anxiety and treatment options. Review healthy ways to manage with stress and anxiety.   Depression: - Provides group verbal and written instruction on the correlation between heart/lung disease and depressed mood, treatment options, and the stigmas associated with seeking treatment.   Exercise & Equipment Safety: - Individual verbal instruction and demonstration of equipment use and safety with use of the equipment.   Pulmonary Rehab from 10/11/2018 in [facility] Cardiac and Pulmonary Rehab  Date  10/11/18  Educator  AS  Instruction Review Code  1- Verbalizes Understanding      Infection Prevention: - Provides verbal and written material to individual with discussion of infection control including proper hand washing and proper equipment cleaning during exercise session.   Pulmonary Rehab from 10/11/2018 in [facility] Cardiac and Pulmonary Rehab  Date  10/11/18  Educator  AS  Instruction Review Code  1- Verbalizes Understanding      Falls Prevention: - Provides verbal and written material to individual with discussion of falls prevention and safety.   Pulmonary Rehab from 10/11/2018 in [facility] Cardiac and Pulmonary Rehab  Date  10/11/18   Educator  AS  Instruction Review Code  1- Verbalizes Understanding      Diabetes: - Individual verbal and written instruction to review signs/symptoms of diabetes, desired ranges of glucose level fasting, after meals and with exercise. Advice that pre and post exercise glucose checks will be done for 3 sessions at entry of program.   Chronic Lung Diseases: - Group verbal and written instruction to review updates, respiratory medications, advancements in procedures and treatments. Discuss use of supplemental oxygen including available portable oxygen systems, continuous and intermittent flow rates, concentrators, personal use and safety guidelines. Review proper use of inhaler and spacers. Provide informative websites for self-education.    Energy Conservation: - Provide group verbal and written instruction for methods to conserve energy, plan and organize activities. Instruct on pacing techniques, use of adaptive equipment and posture/positioning to relieve shortness of breath.   Triggers and Exacerbations: - Group verbal and written instruction to review types of environmental triggers and ways to prevent exacerbations. Discuss weather changes, air quality and the benefits of nasal washing. Review warning signs and symptoms to help prevent infections. Discuss techniques for effective airway clearance, coughing, and vibrations.   AED/CPR: - Group verbal and written instruction with the use of models to demonstrate the basic use of the AED with the basic ABC's of resuscitation.   Anatomy and Physiology of the Lungs: - Group verbal and written instruction with the use of models to provide basic lung anatomy and physiology related to function, structure and complications of lung disease.   Anatomy & Physiology of the Heart: - Group verbal and written instruction and models provide basic cardiac anatomy and physiology, with the  coronary electrical and arterial systems. Review of Valvular  disease and Heart Failure   Cardiac Medications: - Group verbal and written instruction to review commonly prescribed medications for heart disease. Reviews the medication, class of the drug, and side effects.   Know Your Numbers and Risk Factors: -Group verbal and written instruction about important numbers in your health.  Discussion of what are risk factors and how they play a role in the disease process.  Review of Cholesterol, Blood Pressure, Diabetes, and BMI and the role they play in your overall health.   Sleep Hygiene: -Provides group verbal and written instruction about how sleep can affect your health.  Define sleep hygiene, discuss sleep cycles and impact of sleep habits. Review good sleep hygiene tips.    Other: -Provides group and verbal instruction on various topics (see comments)    Knowledge Questionnaire Score: Knowledge Questionnaire Score - 10/11/18 1501      Knowledge Questionnaire Score   Pre Score  14/18        Core Components/Risk Factors/Patient Goals at Admission: Personal Goals and Risk Factors at Admission - 10/11/18 1457      Core Components/Risk Factors/Patient Goals on Admission    Weight Management  Yes;Weight Loss    Intervention  Weight Management: Develop a combined nutrition and exercise program designed to reach desired caloric intake, while maintaining appropriate intake of nutrient and fiber, sodium and fats, and appropriate energy expenditure required for the weight goal.;Weight Management: Provide education and appropriate resources to help participant work on and attain dietary goals.    Admit Weight  210 lb 1.6 oz (95.3 kg)    Goal Weight: Short Term  200 lb (90.7 kg)    Goal Weight: Long Term  190 lb (86.2 kg)    Expected Outcomes  Short Term: Continue to assess and modify interventions until short term weight is achieved;Long Term: Adherence to nutrition and physical activity/exercise program aimed toward attainment of established  weight goal    Intervention  Provide education on lifestyle modifcations including regular physical activity/exercise, weight management, moderate sodium restriction and increased consumption of fresh fruit, vegetables, and low fat dairy, alcohol moderation, and smoking cessation.;Monitor prescription use compliance.    Expected Outcomes  Short Term: Continued assessment and intervention until BP is < 140/61m HG in hypertensive participants. < 130/831mHG in hypertensive participants with diabetes, heart failure or chronic kidney disease.;Long Term: Maintenance of blood pressure at goal levels.    Intervention  Provide education and support for participant on nutrition & aerobic/resistive exercise along with prescribed medications to achieve LDL '70mg'$ , HDL >'40mg'$ .    Expected Outcomes  Short Term: Participant states understanding of desired cholesterol values and is compliant with medications prescribed. Participant is following exercise prescription and nutrition guidelines.;Long Term: Cholesterol controlled with medications as prescribed, with individualized exercise RX and with personalized nutrition plan. Value goals: LDL < '70mg'$ , HDL > 40 mg.       Core Components/Risk Factors/Patient Goals Review:  Goals and Risk Factor Review    [place] Name 10/29/18 1535             Core Components/Risk Factors/Patient Goals Review   Personal Goals Review  Weight Management/Obesity;Hypertension;Lipids       Review  MiSharyn Lulls doing well in weight.  Today she was up 2lbs but has started her cycle this week too, but no increased SOB.  Blood pressures are doing good, but she does not check at home as she does not have a cuff.  If she is out at a pharmacy she will get it checked.  She is doing well with her medications.       Expected Outcomes  Short: Continue to work on weight loss.  Long: Continue to montior risk factors.          Core Components/Risk Factors/Patient Goals at Discharge (Final Review):   Goals and Risk Factor Review - 10/29/18 1535      Core Components/Risk Factors/Patient Goals Review   Personal Goals Review  Weight Management/Obesity;Hypertension;Lipids    Review  Samantha Greene is doing well in weight.  Today she was up 2lbs but has started her cycle this week too, but no increased SOB.  Blood pressures are doing good, but she does not check at home as she does not have a cuff.  If she is out at a pharmacy she will get it checked.  She is doing well with her medications.    Expected Outcomes  Short: Continue to work on weight loss.  Long: Continue to montior risk factors.       ITP Comments: ITP Comments    Row Name 09/24/18 1751 10/11/18 1444 10/17/18 1422 11/07/18 1240 12/03/18 1419   ITP Comments  Virtual Orientation completed today for Pulmonary Rehab. EP/RD appt on 8/26  Documentation for diagnosis can be found in [place] 5/28 and 8/11  Initial 6MWT and nutrition consult completed. ITp created and sent to Dr Sabra Heck to review and sign.  First full day of exercise!  Patient was oriented to gym and equipment including functions, settings, policies, and procedures.  Patient's individual exercise prescription and treatment plan were reviewed.  All starting workloads were established based on the results of the 6 minute walk test done at initial orientation visit.  The plan for exercise progression was also introduced and progression will be customized based on patient's performance and goals.  30 day review completed. ITP sent to Dr. Emily Filbert, Medical Director of Cardiac and Pulmonary Rehab. Continue with ITP unless changes are made by physician.  Department closed starting 10/2 until further notice by infection prevention and Health at Work teams for [place].  Samantha Greene called out today. She has not been able to be here this month due to caring for her aunt.   [place] Name 12/04/18 1151 12/05/18 0615         ITP Comments  Unable to complete nutrition 30-day re-eval cycle due to  inconsistent attendence  30 day review completed. Continue with ITP sent to Dr. Emily Filbert, Medical Director of Cardiac and Pulmonary Rehab for review , changes as needed and signature.  Last visit 9/30  Calls have been made         Comments:

## 2018-12-11 ENCOUNTER — Encounter: Payer: Self-pay | Admitting: Physician Assistant

## 2018-12-11 DIAGNOSIS — G4733 Obstructive sleep apnea (adult) (pediatric): Secondary | ICD-10-CM | POA: Insufficient documentation

## 2018-12-11 DIAGNOSIS — Z9989 Dependence on other enabling machines and devices: Secondary | ICD-10-CM | POA: Insufficient documentation

## 2018-12-11 NOTE — Progress Notes (Signed)
Assessment and Plan: Essential hypertension - continue medications, DASH diet, exercise and monitor at home. Call if greater than 130/80.  -     CBC with Diff -     COMPLETE METABOLIC PANEL WITH GFR -     TSH  OSA on CPAP Continue CPAP Discussed normally see a better benefit closer to 3 months She is sleeping better with it, still has morning fatigue, has follow up with Dr. Golden Hurterohemier, ? If she does have central sleep apnea Mixed hyperlipidemia -     Lipid Profile  Vitamin D deficiency -     Vitamin D (25 hydroxy)  Morbid obesity (HCC) - follow up 3 months for progress monitoring - increase veggies, decrease carbs - long discussion about weight loss, diet, and exercise  Abnormal glucose -     Hemoglobin A1c ([facility]) Discussed disease progression and risks Discussed diet/exercise, weight management and risk modification  Right-sided chest pain -     omeprazole (PRILOSEC) 40 MG capsule; TAKE 1 CAPSULE BY MOUTH EVERY DAY -     Urinalysis, Routine w reflex microscopic ? From the surgery, gabapentin is helping some ? From gallbladder, US 1 year ago showed mobile stone but no inflammation, will check labs and consider getting another US- patient given strict ER precautions.  ? GERD- get on prilosec daily  Medication management -     Magnesium  Further disposition pending results of labs. Discussed med's effects and SE's.   Over 30 minutes of exam, counseling, chart review, and critical decision making was performed.   Future Appointments  Date Time Provider Department Center  12/12/2018  3:45 PM Quentin MullingCollier, Dayveon Halley, PA-C [facility] None  12/17/2018  2:30 PM [facility] None  12/19/2018  2:30 PM [facility] None  12/20/2018  2:30 PM [facility] None  12/24/2018  2:30 PM [facility] None  12/26/2018  2:30 PM [facility] None  12/31/2018  2:30 PM [facility] PHASE II EXC  [facility] None  01/02/2019  2:30 PM [facility] PH[facility] II EXC [facility] None  01/03/2019 11:30 AM Millikan, Megan, NP [facility] None  01/03/2019  2:30 PM [facility] PH[facility] None  01/07/2019  2:30 PM [facility] PH[facility] II EXC [facility] None  01/09/2019  2:30 PM [facility] PH[facility] II EXC [facility] None  01/10/2019  2:30 PM [facility] PHASE II EXC [facility] None  01/14/2019  2:30 PM [facility] PH[facility] II EXC [facility] None  01/16/2019  2:30 PM [facility] PHASE II EXC [facility] None  01/17/2019  2:00 PM Genia DelLavoie, Marie-Lyne, MD [facility]  01/17/2019  2:30 PM [facility] PHASE II EXC [facility] None  01/21/2019  2:30 PM [facility] PHASE II EXC [facility] None  01/23/2019  2:30 PM [facility] PHASE II EXC [facility] None  01/24/2019  2:30 PM [facility] PHASE II EXC [facility] None  01/28/2019  2:30 PM [facility] PHASE II EXC [facility] None  01/30/2019  2:30 PM [facility] PHASE II EXC [facility] None  01/31/2019  2:30 PM [facility] PHASE II EXC [facility] None  02/04/2019  2:30 PM [facility] PHASE II EXC [facility] None  02/06/2019  2:30 PM [facility] PHASE II EXC [facility] None  02/07/2019  2:30 PM [facility] PHASE II EXC [facility] None  02/11/2019  2:30 PM [facility] PHASE II EXC [facility] None  02/13/2019  2:30 PM [facility] PHASE II EXC [facility] None  03/18/2019 10:00 AM Quentin Mullingollier, Tiquan Bouch, PA-C [facility] None    ------------------------------------------------------------------------------------------------------------------   HPI BP 116/74  Pulse 75   Temp (!) 96.4 F (35.8 C)   Wt 213 lb (96.6 kg)   SpO2 97%   BMI 40.91 kg/m   47 y.o.female presents for follow up for HTN, chol, obesity, DOE.   BMI is Body mass index is 40.91 kg/m., she is working on diet and exercise.  Wt Readings from Last 3 Encounters:  12/12/18 213 lb (96.6 kg)  10/11/18 210 lb 1.6 oz (95.3 kg)  10/10/18 209 lb (94.8 kg)   Her blood pressure has been controlled at home, today their BP is BP:  116/74  She does workout. She denies chest pain, dizziness. She is s/p VATS 03/23/2018, was still having SOB so she has been doing pulmonary rehab, not at this time , she is taking care of her aunt who is 89.   And had a sleep study that showed severe OSA with AHI 37.5/h and possible central sleep apnea characteristics.  and is now on a CPAP little less than a month, uses [employer]. States she is using it nightly, she is sleeping better with it but still fatigued in the AM.  No CP, no fever, chills, cough.    She is on cholesterol medication and denies myalgias. Her cholesterol is not at goal. The cholesterol last visit was:   Lab Results  Component Value Date   CHOL 226 (H) 09/11/2018   HDL 62 09/11/2018   LDLCALC 132 (H) 09/11/2018   TRIG 188 (H) 09/11/2018   CHOLHDL 3.6 09/11/2018    Patient is on Vitamin D supplement.   Lab Results  Component Value Date   VD25OH 33 09/11/2018     Lab Results  Component Value Date   HGBA1C 5.6 09/11/2018     Past Medical History:  Diagnosis Date  . Anxiety   . Arthritis   . Depression    no meds  . Diaphragmatic hernia with obstruction, without gangrene 11/07/2018  . Dyspnea    can happen anytime  . Family history of breast cancer   . Family history of colon cancer   . Family history of lung cancer   . Family history of pancreatic cancer   . Family history of prostate cancer   . GERD (gastroesophageal reflux disease)   . Hernia, diaphragmatic   . History of kidney stones   . Hyperlipidemia   . Hypertension    no meds  . Pneumonia    1 time     Allergies  Allergen Reactions  . Erythromycin Nausea Only and Other (See Comments)    Stomach cramping (can take Z Pak)  . Flexeril [Cyclobenzaprine Hcl] Other (See Comments)    "don't like the way I feel"  . Metformin And Related Other (See Comments)    GI Upset    Current Outpatient Medications on File Prior to Visit  Medication Sig  . acetaminophen (TYLENOL) 500 MG tablet Take 2  tablets (1,000 mg total) by mouth every 6 (six) hours as needed.  Marland Kitchen albuterol (VENTOLIN HFA) 108 (90 Base) MCG/ACT inhaler Inhale 2 puffs into the lungs every 4 (four) hours as needed for wheezing or shortness of breath.  Marland Kitchen aspirin 81 MG tablet Take 81 mg by mouth daily.  . baclofen (LIORESAL) 10 MG tablet Take 1/2 to 1 tablet 1 or 2 x /day as needed for Muscle Spasms  . buPROPion (WELLBUTRIN XL) 300 MG 24 hr tablet Take 1 tablet every Morning for Mood, Focus & Concentration  . cetirizine (ZYRTEC) 10 MG tablet Take 10  mg by mouth at bedtime.  . Cholecalciferol (VITAMIN D) 125 MCG (5000 UT) CAPS Take 5,000 Units by mouth daily.   Marland Kitchen escitalopram (LEXAPRO) 20 MG tablet TAKE 1 TABLET BY MOUTH EVERY DAY  . Eszopiclone 3 MG TABS TAKE 1 TABLET BY MOUTH AT BEDTIME AS NEEDED. TAKE IMMEDIATELY BEFORE BEDTIME  . fluocinolone (SYNALAR) 0.025 % ointment APPLY TO AFFECTED AREA TWICE A DAY  . fluticasone (FLONASE) 50 MCG/ACT nasal spray Place 2 sprays into both nostrils daily.  Marland Kitchen gabapentin (NEURONTIN) 300 MG capsule Take 1 capsule (300 mg total) by mouth 3 (three) times daily. (Patient taking differently: Take 300 mg by mouth 3 (three) times daily. Patient taking BID)  . meloxicam (MOBIC) 15 MG tablet Take 1/2 to 1 tablet Daily for Pain & Inflammation & limit to 4-5 tablets  /week to Avoid Kidney Damage  . montelukast (SINGULAIR) 10 MG tablet Take 1 tablet (10 mg total) by mouth at bedtime. Take 1 tablet daily for Allergies  . nystatin cream (MYCOSTATIN) Apply 1 application topically 2 (two) times daily. (Patient taking differently: Apply 1 application topically 2 (two) times daily as needed for dry skin. )  . omeprazole (PRILOSEC) 40 MG capsule TAKE 1 CAPSULE BY MOUTH EVERY DAY  . rosuvastatin (CRESTOR) 5 MG tablet Take 1 tablet (5 mg total) by mouth at bedtime.  Marland Kitchen telmisartan (MICARDIS) 40 MG tablet Take 1 tablet Daily for BP  . triamcinolone ointment (KENALOG) 0.1 % Apply 1 application topically 2 (two) times  daily. (Patient taking differently: Apply 1 application topically daily as needed (rash). )  . vitamin C (ASCORBIC ACID) 500 MG tablet Take 500 mg by mouth daily.  . vitamin E 600 UNIT capsule Take 600 Units by mouth daily.  Marland Kitchen ibuprofen (ADVIL,MOTRIN) 800 MG tablet Take 1 tablet (800 mg total) by mouth every 8 (eight) hours as needed (breakthrough pain). (Patient not taking: Reported on 12/12/2018)  . SPRINTEC 28 0.25-35 MG-MCG tablet TAKE 1 TABLET BY MOUTH EVERY DAY   No current facility-administered medications on file prior to visit.     ROS: Review of Systems  Constitutional: Negative for chills, fever, malaise/fatigue and weight loss.  HENT: Negative for hearing loss and tinnitus.   Eyes: Negative for blurred vision and double vision.  Respiratory: Positive for shortness of breath. Negative for cough, hemoptysis, sputum production and wheezing.   Cardiovascular: Positive for leg swelling. Negative for chest pain, palpitations, orthopnea, claudication and PND.  Gastrointestinal: Negative for abdominal pain, blood in stool, constipation, diarrhea, heartburn, melena, nausea and vomiting.  Genitourinary: Negative.   Musculoskeletal: Negative for back pain, joint pain, myalgias and neck pain.  Skin: Negative for rash.  Neurological: Negative for dizziness, tingling, sensory change, weakness and headaches.  Endo/Heme/Allergies: Negative for polydipsia.  Psychiatric/Behavioral: The patient has insomnia.   All other systems reviewed and are negative.   Physical Exam:  BP 116/74   Pulse 75   Temp (!) 96.4 F (35.8 C)   Wt 213 lb (96.6 kg)   SpO2 97%   BMI 40.91 kg/m   General Appearance: Well nourished, in no apparent distress. Eyes: PERRLA, EOMs, conjunctiva no swelling or erythema ENT/Mouth: No erythema, swelling, or exudate on post pharynx.  Tonsils not swollen or erythematous. Hearing normal.  Neck: Supple, thyroid normal.  Respiratory: Respiratory effort normal, BS equal  except decreased RLL without rales, rhonchi, wheezing or stridor.  Cardio: RRR with no MRGs. Brisk peripheral pulses without edema.  Abdomen: Soft, + BS.  Non tender, no  guarding, rebound, hernias, masses. Lymphatics: Non tender without lymphadenopathy.  Musculoskeletal: Full ROM, 5/5 strength, normal gait. Skin: Warm, dry without rashes, lesions, ecchymosis.  Neuro: Cranial nerves intact. Normal muscle tone, no cerebellar symptoms. Sensation intact.  Psych: Awake and oriented X 3, normal affect, Insight and Judgment appropriate.     Quentin Mulling, PA-C 3:44 PM [facility]

## 2018-12-12 ENCOUNTER — Other Ambulatory Visit: Payer: Self-pay

## 2018-12-12 ENCOUNTER — Encounter: Payer: Self-pay | Admitting: Physician Assistant

## 2018-12-12 ENCOUNTER — Ambulatory Visit: Payer: BC Managed Care – PPO | Admitting: Physician Assistant

## 2018-12-12 VITALS — BP 116/74 | HR 75 | Temp 96.4°F | Wt 213.0 lb

## 2018-12-12 DIAGNOSIS — G4733 Obstructive sleep apnea (adult) (pediatric): Secondary | ICD-10-CM | POA: Diagnosis not present

## 2018-12-12 DIAGNOSIS — E559 Vitamin D deficiency, unspecified: Secondary | ICD-10-CM | POA: Diagnosis not present

## 2018-12-12 DIAGNOSIS — I10 Essential (primary) hypertension: Secondary | ICD-10-CM

## 2018-12-12 DIAGNOSIS — E782 Mixed hyperlipidemia: Secondary | ICD-10-CM | POA: Diagnosis not present

## 2018-12-12 DIAGNOSIS — Z79899 Other long term (current) drug therapy: Secondary | ICD-10-CM | POA: Diagnosis not present

## 2018-12-12 DIAGNOSIS — R7309 Other abnormal glucose: Secondary | ICD-10-CM | POA: Diagnosis not present

## 2018-12-12 DIAGNOSIS — Z9989 Dependence on other enabling machines and devices: Secondary | ICD-10-CM

## 2018-12-12 DIAGNOSIS — R079 Chest pain, unspecified: Secondary | ICD-10-CM

## 2018-12-12 MED ORDER — OMEPRAZOLE 40 MG PO CPDR: DELAYED_RELEASE_CAPSULE | ORAL | 3 refills | Status: DC

## 2018-12-12 NOTE — Patient Instructions (Signed)

## 2018-12-13 LAB — CBC WITH DIFFERENTIAL/PLATELET
Absolute Monocytes: 655 cells/uL (ref 200–950)
Basophils Absolute: 18 cells/uL (ref 0–200)
Basophils Relative: 0.3 %
Eosinophils Absolute: 89 cells/uL (ref 15–500)
Eosinophils Relative: 1.5 %
HCT: 38 % (ref 35.0–45.0)
Hemoglobin: 12.9 g/dL (ref 11.7–15.5)
Lymphs Abs: 1634 cells/uL (ref 850–3900)
MCH: 32.7 pg (ref 27.0–33.0)
MCHC: 33.9 g/dL (ref 32.0–36.0)
MCV: 96.2 fL (ref 80.0–100.0)
MPV: 9.6 fL (ref 7.5–12.5)
Monocytes Relative: 11.1 %
Neutro Abs: 3505 cells/uL (ref 1500–7800)
Neutrophils Relative %: 59.4 %
Platelets: 295 10*3/uL (ref 140–400)
RBC: 3.95 10*6/uL (ref 3.80–5.10)
RDW: 12.5 % (ref 11.0–15.0)
Total Lymphocyte: 27.7 %
WBC: 5.9 10*3/uL (ref 3.8–10.8)

## 2018-12-13 LAB — TSH: TSH: 2.24 mIU/L

## 2018-12-13 LAB — COMPLETE METABOLIC PANEL WITH GFR
AG Ratio: 2 (calc) (ref 1.0–2.5)
ALT: 16 U/L (ref 6–29)
AST: 13 U/L (ref 10–35)
Albumin: 4.5 g/dL (ref 3.6–5.1)
Alkaline phosphatase (APISO): 59 U/L (ref 31–125)
BUN: 21 mg/dL (ref 7–25)
CO2: 26 mmol/L (ref 20–32)
Calcium: 9.3 mg/dL (ref 8.6–10.2)
Chloride: 106 mmol/L (ref 98–110)
Creat: 0.9 mg/dL (ref 0.50–1.10)
GFR, Est African American: 88 mL/min/{1.73_m2} (ref >=60)
GFR, Est Non African American: 76 mL/min/{1.73_m2} (ref >=60)
Globulin: 2.3 g/dL (calc) (ref 1.9–3.7)
Glucose, Bld: 88 mg/dL (ref 65–99)
Potassium: 4 mmol/L (ref 3.5–5.3)
Sodium: 140 mmol/L (ref 135–146)
Total Bilirubin: 0.4 mg/dL (ref 0.2–1.2)
Total Protein: 6.8 g/dL (ref 6.1–8.1)

## 2018-12-13 LAB — URINALYSIS, ROUTINE W REFLEX MICROSCOPIC
Bilirubin Urine: NEGATIVE
Glucose, UA: NEGATIVE
Hgb urine dipstick: NEGATIVE
Ketones, ur: NEGATIVE
Leukocytes,Ua: NEGATIVE
Nitrite: NEGATIVE
Protein, ur: NEGATIVE
Specific Gravity, Urine: 1.019 (ref 1.001–1.03)
pH: 5.5 (ref 5.0–8.0)

## 2018-12-13 LAB — HEMOGLOBIN A1C
Hgb A1c MFr Bld: 5.4 % of total Hgb (ref <5.7)
Mean Plasma Glucose: 108 (calc)
eAG (mmol/L): 6 (calc)

## 2018-12-13 LAB — MAGNESIUM: Magnesium: 2.2 mg/dL (ref 1.5–2.5)

## 2018-12-13 LAB — LIPID PANEL
Cholesterol: 170 mg/dL (ref <200)
HDL: 63 mg/dL (ref >=50)
LDL Cholesterol (Calc): 78 mg/dL (calc)
Non-HDL Cholesterol (Calc): 107 mg/dL (calc) (ref <130)
Total CHOL/HDL Ratio: 2.7 (calc) (ref <5.0)
Triglycerides: 194 mg/dL — ABNORMAL HIGH (ref <150)

## 2018-12-13 LAB — VITAMIN D 25 HYDROXY (VIT D DEFICIENCY, FRACTURES): Vit D, 25-Hydroxy: 43 ng/mL (ref 30–100)

## 2018-12-17 ENCOUNTER — Encounter: Payer: BC Managed Care – PPO | Attending: Internal Medicine

## 2018-12-17 DIAGNOSIS — Z7982 Long term (current) use of aspirin: Secondary | ICD-10-CM | POA: Insufficient documentation

## 2018-12-17 DIAGNOSIS — Z8042 Family history of malignant neoplasm of prostate: Secondary | ICD-10-CM | POA: Insufficient documentation

## 2018-12-17 DIAGNOSIS — M1990 Unspecified osteoarthritis, unspecified site: Secondary | ICD-10-CM | POA: Insufficient documentation

## 2018-12-17 DIAGNOSIS — Z800 Family history of malignant neoplasm of digestive organs: Secondary | ICD-10-CM | POA: Insufficient documentation

## 2018-12-17 DIAGNOSIS — K219 Gastro-esophageal reflux disease without esophagitis: Secondary | ICD-10-CM | POA: Insufficient documentation

## 2018-12-17 DIAGNOSIS — E785 Hyperlipidemia, unspecified: Secondary | ICD-10-CM | POA: Insufficient documentation

## 2018-12-17 DIAGNOSIS — Z801 Family history of malignant neoplasm of trachea, bronchus and lung: Secondary | ICD-10-CM | POA: Insufficient documentation

## 2018-12-17 DIAGNOSIS — R0609 Other forms of dyspnea: Secondary | ICD-10-CM | POA: Insufficient documentation

## 2018-12-17 DIAGNOSIS — F329 Major depressive disorder, single episode, unspecified: Secondary | ICD-10-CM | POA: Insufficient documentation

## 2018-12-17 DIAGNOSIS — Z79899 Other long term (current) drug therapy: Secondary | ICD-10-CM | POA: Insufficient documentation

## 2018-12-17 DIAGNOSIS — Z791 Long term (current) use of non-steroidal anti-inflammatories (NSAID): Secondary | ICD-10-CM | POA: Insufficient documentation

## 2018-12-17 DIAGNOSIS — F419 Anxiety disorder, unspecified: Secondary | ICD-10-CM | POA: Insufficient documentation

## 2018-12-17 DIAGNOSIS — Z803 Family history of malignant neoplasm of breast: Secondary | ICD-10-CM | POA: Insufficient documentation

## 2018-12-20 ENCOUNTER — Other Ambulatory Visit: Payer: Self-pay

## 2018-12-24 DIAGNOSIS — G4733 Obstructive sleep apnea (adult) (pediatric): Secondary | ICD-10-CM | POA: Diagnosis not present

## 2018-12-26 ENCOUNTER — Other Ambulatory Visit: Payer: Self-pay | Admitting: Physician Assistant

## 2018-12-26 ENCOUNTER — Telehealth: Payer: Self-pay

## 2018-12-26 MED ORDER — PREDNISONE 20 MG PO TABS: ORAL_TABLET | ORAL | 0 refills | Status: DC

## 2018-12-26 MED ORDER — CYCLOBENZAPRINE HCL 10 MG PO TABS: 10.0000 mg | ORAL_TABLET | Freq: Three times a day (TID) | ORAL | 1 refills | Status: DC | PRN

## 2018-12-26 NOTE — Telephone Encounter (Signed)
LMOM

## 2018-12-30 DIAGNOSIS — M542 Cervicalgia: Secondary | ICD-10-CM

## 2019-01-01 ENCOUNTER — Telehealth: Payer: Self-pay | Admitting: *Deleted

## 2019-01-01 ENCOUNTER — Encounter: Payer: Self-pay | Admitting: *Deleted

## 2019-01-01 DIAGNOSIS — R0609 Other forms of dyspnea: Secondary | ICD-10-CM

## 2019-01-01 NOTE — Progress Notes (Signed)
Cardiac Individual Treatment Plan  Patient Details  Name: Samantha Greene MRN: [id number] Date of Birth: 09-22-1970 Referring Provider:     Pulmonary Rehab from 10/11/2018 in [facility] Cardiac and Pulmonary Rehab  Referring Provider  McKeown      Initial Encounter Date:    Pulmonary Rehab from 10/11/2018 in [facility] Cardiac and Pulmonary Rehab  Date  10/11/18      Visit Diagnosis: DOE (dyspnea on exertion)  Patient's Home Medications on Admission:  Current Outpatient Medications:  .  acetaminophen (TYLENOL) 500 MG tablet, Take 2 tablets (1,000 mg total) by mouth every 6 (six) hours as needed., Disp: 30 tablet, Rfl: 0 .  albuterol (VENTOLIN HFA) 108 (90 Base) MCG/ACT inhaler, Inhale 2 puffs into the lungs every 4 (four) hours as needed for wheezing or shortness of breath., Disp: 1 Inhaler, Rfl: 0 .  aspirin 81 MG tablet, Take 81 mg by mouth daily., Disp: , Rfl:  .  baclofen (LIORESAL) 10 MG tablet, Take 1/2 to 1 tablet 1 or 2 x /day as needed for Muscle Spasms, Disp: 180 tablet, Rfl: 1 .  buPROPion (WELLBUTRIN XL) 300 MG 24 hr tablet, Take 1 tablet every Morning for Mood, Focus & Concentration, Disp: 90 tablet, Rfl: 3 .  cetirizine (ZYRTEC) 10 MG tablet, Take 10 mg by mouth at bedtime., Disp: , Rfl:  .  Cholecalciferol (VITAMIN D) 125 MCG (5000 UT) CAPS, Take 5,000 Units by mouth daily. , Disp: , Rfl:  .  cyclobenzaprine (FLEXERIL) 10 MG tablet, Take 1 tablet (10 mg total) by mouth 3 (three) times daily as needed for muscle spasms., Disp: 60 tablet, Rfl: 1 .  escitalopram (LEXAPRO) 20 MG tablet, TAKE 1 TABLET BY MOUTH EVERY DAY, Disp: 90 tablet, Rfl: 1 .  Eszopiclone 3 MG TABS, TAKE 1 TABLET BY MOUTH AT BEDTIME AS NEEDED. TAKE IMMEDIATELY BEFORE BEDTIME, Disp: 30 tablet, Rfl: 3 .  fluocinolone (SYNALAR) 0.025 % ointment, APPLY TO AFFECTED AREA TWICE A DAY, Disp: 30 g, Rfl: 0 .  fluticasone (FLONASE) 50 MCG/ACT nasal spray, Place 2 sprays into both nostrils daily., Disp: 16 g, Rfl: 2 .   gabapentin (NEURONTIN) 300 MG capsule, Take 1 capsule (300 mg total) by mouth 3 (three) times daily. (Patient taking differently: Take 300 mg by mouth 3 (three) times daily. Patient taking BID), Disp: 90 capsule, Rfl: 2 .  meloxicam (MOBIC) 15 MG tablet, Take 1/2 to 1 tablet Daily for Pain & Inflammation & limit to 4-5 tablets  /week to Avoid Kidney Damage, Disp: 90 tablet, Rfl: 0 .  montelukast (SINGULAIR) 10 MG tablet, Take 1 tablet (10 mg total) by mouth at bedtime. Take 1 tablet daily for Allergies, Disp: 90 tablet, Rfl: 3 .  nystatin cream (MYCOSTATIN), Apply 1 application topically 2 (two) times daily. (Patient taking differently: Apply 1 application topically 2 (two) times daily as needed for dry skin. ), Disp: 30 g, Rfl: 1 .  omeprazole (PRILOSEC) 40 MG capsule, TAKE 1 CAPSULE BY MOUTH EVERY DAY, Disp: 90 capsule, Rfl: 3 .  predniSONE (DELTASONE) 20 MG tablet, 2 tablets daily for 3 days, 1 tablet daily for 4 days., Disp: 10 tablet, Rfl: 0 .  rosuvastatin (CRESTOR) 5 MG tablet, Take 1 tablet (5 mg total) by mouth at bedtime., Disp: 90 tablet, Rfl: 3 .  telmisartan (MICARDIS) 40 MG tablet, Take 1 tablet Daily for BP, Disp: 90 tablet, Rfl: 1 .  triamcinolone ointment (KENALOG) 0.1 %, Apply 1 application topically 2 (two) times daily. (Patient taking differently:  Apply 1 application topically daily as needed (rash). ), Disp: 80 g, Rfl: 1 .  vitamin C (ASCORBIC ACID) 500 MG tablet, Take 500 mg by mouth daily., Disp: , Rfl:  .  vitamin E 600 UNIT capsule, Take 600 Units by mouth daily., Disp: , Rfl:   Past Medical History: Past Medical History:  Diagnosis Date  . Anxiety   . Arthritis   . Depression    no meds  . Diaphragmatic hernia with obstruction, without gangrene 11/07/2018  . Dyspnea    can happen anytime  . Family history of breast cancer   . Family history of colon cancer   . Family history of lung cancer   . Family history of pancreatic cancer   . Family history of prostate cancer    . GERD (gastroesophageal reflux disease)   . Hernia, diaphragmatic   . History of kidney stones   . Hyperlipidemia   . Hypertension    no meds  . Pneumonia    1 time    Tobacco Use: Social History   Tobacco Use  Smoking Status Never Smoker  Smokeless Tobacco Never Used    Labs: Recent Review Flowsheet Data    Labs for ITP Cardiac and Pulmonary Rehab Latest Ref Rng & Units 03/08/2018 03/21/2018 03/24/2018 09/11/2018 12/12/2018   Cholestrol <200 mg/dL 210(H) - - 226(H) 170   LDLCALC mg/dL (calc) 108(H) - - 132(H) 78   HDL > OR = 50 mg/dL 76 - - 62 63   Trlycerides <150 mg/dL 150(H) - - 188(H) 194(H)   Hemoglobin A1c <5.7 % of total Hgb - - - 5.6 5.4   PHART 7.350 - 7.450 - 7.427 7.361 - -   PCO2ART 32.0 - 48.0 mmHg - 35.9 45.9 - -   HCO3 20.0 - 28.0 mmol/L - 23.2 25.4 - -   ACIDBASEDEF 0.0 - 2.0 mmol/L - 0.6 - - -   O2SAT % - 96.8 96.7 - -       Exercise Target Goals: Exercise Program Goal: Individual exercise prescription set using results from initial 6 min walk test and THRR while considering  patient's activity barriers and safety.   Exercise Prescription Goal: Initial exercise prescription builds to 30-45 minutes a day of aerobic activity, 2-3 days per week.  Home exercise guidelines will be given to patient during program as part of exercise prescription that the participant will acknowledge.  Activity Barriers & Risk Stratification: Activity Barriers & Cardiac Risk Stratification - 09/24/18 1735      Activity Barriers & Cardiac Risk Stratification   Activity Barriers  Other (comment)    Comments  weak ankles  panter facitis, muscle spasm neck and back       6 Minute Walk: 6 Minute Walk    Row Name 10/11/18 1445         6 Minute Walk   Phase  Initial     Distance  1300 feet     Walk Time  6 minutes     # of Rest Breaks  0     MPH  2.46     METS  3.48     RPE  13     Perceived Dyspnea   3     VO2 Peak  12.2     Symptoms  No     Resting HR  84 bpm      Resting BP  122/74     Resting Oxygen Saturation   98 %  Exercise Oxygen Saturation  during 6 min walk  90 %     Max Ex. HR  107 bpm     Max Ex. BP  138/84     2 Minute Post BP  126/78       Interval HR   1 Minute HR  92     2 Minute HR  103     3 Minute HR  106     4 Minute HR  107     5 Minute HR  103     6 Minute HR  103     2 Minute Post HR  88     Interval Heart Rate?  Yes       Interval Oxygen   Interval Oxygen?  Yes     Baseline Oxygen Saturation %  98 %     1 Minute Oxygen Saturation %  91 %     1 Minute Liters of Oxygen  0 L     2 Minute Oxygen Saturation %  90 %     2 Minute Liters of Oxygen  0 L     3 Minute Oxygen Saturation %  91 %     3 Minute Liters of Oxygen  0 L     4 Minute Oxygen Saturation %  93 %     4 Minute Liters of Oxygen  0 L     5 Minute Oxygen Saturation %  92 %     5 Minute Liters of Oxygen  0 L     6 Minute Oxygen Saturation %  93 %     6 Minute Liters of Oxygen  0 L     2 Minute Post Oxygen Saturation %  98 %     2 Minute Post Liters of Oxygen  0 L        Oxygen Initial Assessment: Oxygen Initial Assessment - 10/17/18 1423      Home Oxygen   Home Oxygen Device  None    Sleep Oxygen Prescription  None    Home Exercise Oxygen Prescription  None    Home at Rest Exercise Oxygen Prescription  None      Initial 6 min Walk   Oxygen Used  None      Program Oxygen Prescription   Program Oxygen Prescription  None      Intervention   Short Term Goals  To learn and exhibit compliance with exercise, home and travel O2 prescription;To learn and understand importance of monitoring SPO2 with pulse oximeter and demonstrate accurate use of the pulse oximeter.;To learn and understand importance of maintaining oxygen saturations>88%;To learn and demonstrate proper pursed lip breathing techniques or other breathing techniques.;To learn and demonstrate proper use of respiratory medications    Long  Term Goals  Exhibits compliance with exercise,  home and travel O2 prescription;Verbalizes importance of monitoring SPO2 with pulse oximeter and return demonstration;Maintenance of O2 saturations>88%;Exhibits proper breathing techniques, such as pursed lip breathing or other method taught during program session;Compliance with respiratory medication;Demonstrates proper use of MDI's       Oxygen Re-Evaluation: Oxygen Re-Evaluation    Row Name 10/17/18 1424 10/29/18 1537           Program Oxygen Prescription   Program Oxygen Prescription  None  None        Home Oxygen   Home Oxygen Device  None  None      Sleep Oxygen Prescription  None  None  Home Exercise Oxygen Prescription  None  None      Home at Rest Exercise Oxygen Prescription  None  None        Goals/Expected Outcomes   Short Term Goals  To learn and exhibit compliance with exercise, home and travel O2 prescription;To learn and understand importance of monitoring SPO2 with pulse oximeter and demonstrate accurate use of the pulse oximeter.;To learn and understand importance of maintaining oxygen saturations>88%;To learn and demonstrate proper pursed lip breathing techniques or other breathing techniques.;To learn and demonstrate proper use of respiratory medications  To learn and exhibit compliance with exercise, home and travel O2 prescription;To learn and understand importance of monitoring SPO2 with pulse oximeter and demonstrate accurate use of the pulse oximeter.;To learn and understand importance of maintaining oxygen saturations>88%;To learn and demonstrate proper pursed lip breathing techniques or other breathing techniques.;To learn and demonstrate proper use of respiratory medications      Long  Term Goals  Exhibits compliance with exercise, home and travel O2 prescription;Verbalizes importance of monitoring SPO2 with pulse oximeter and return demonstration;Maintenance of O2 saturations>88%;Exhibits proper breathing techniques, such as pursed lip breathing or other  method taught during program session;Compliance with respiratory medication;Demonstrates proper use of MDI's  Exhibits compliance with exercise, home and travel O2 prescription;Verbalizes importance of monitoring SPO2 with pulse oximeter and return demonstration;Maintenance of O2 saturations>88%;Exhibits proper breathing techniques, such as pursed lip breathing or other method taught during program session;Compliance with respiratory medication;Demonstrates proper use of MDI's      Comments  Reviewed PLB technique with pt.  Talked about how it work and it's important to maintaining his exercise saturations.  She uses her inhaler as needed.  She is doing a sleep study tonight.  She has been using her PLB at home and finds it helpful.  She will keep [place] updated on her sleep study.      Goals/Expected Outcomes  Short: Become more profiecient at using PLB.   Long: Become independent at using PLB.  Short: Continue to use PLB.  Long: Continued compliance.         Oxygen Discharge (Final Oxygen Re-Evaluation): Oxygen Re-Evaluation - 10/29/18 1537      Program Oxygen Prescription   Program Oxygen Prescription  None      Home Oxygen   Home Oxygen Device  None    Sleep Oxygen Prescription  None    Home Exercise Oxygen Prescription  None    Home at Rest Exercise Oxygen Prescription  None      Goals/Expected Outcomes   Short Term Goals  To learn and exhibit compliance with exercise, home and travel O2 prescription;To learn and understand importance of monitoring SPO2 with pulse oximeter and demonstrate accurate use of the pulse oximeter.;To learn and understand importance of maintaining oxygen saturations>88%;To learn and demonstrate proper pursed lip breathing techniques or other breathing techniques.;To learn and demonstrate proper use of respiratory medications    Long  Term Goals  Exhibits compliance with exercise, home and travel O2 prescription;Verbalizes importance of monitoring SPO2 with pulse  oximeter and return demonstration;Maintenance of O2 saturations>88%;Exhibits proper breathing techniques, such as pursed lip breathing or other method taught during program session;Compliance with respiratory medication;Demonstrates proper use of MDI's    Comments  She uses her inhaler as needed.  She is doing a sleep study tonight.  She has been using her PLB at home and finds it helpful.  She will keep [place] updated on her sleep study.    Goals/Expected Outcomes  Short:  Continue to use PLB.  Long: Continued compliance.       Initial Exercise Prescription: Initial Exercise Prescription - 10/11/18 1400      Date of Initial Exercise RX and Referring Provider   Date  10/11/18    Referring Provider  McKeown      Treadmill   MPH  2.4    Grade  0.5    Minutes  15    METs  3      Recumbant Bike   Level  3    RPM  60    Watts  45    Minutes  15    METs  3      Elliptical   Level  1    Speed  3    Minutes  15      Biostep-RELP   Level  3    SPM  50    Minutes  15    METs  3      Prescription Details   Frequency (times per week)  3    Duration  Progress to 30 minutes of continuous aerobic without signs/symptoms of physical distress      Intensity   THRR 40-80% of Max Heartrate  119-154    Ratings of Perceived Exertion  11-15    Perceived Dyspnea  0-4      Resistance Training   Training Prescription  Yes    Weight  3 lb    Reps  10-15       Perform Capillary Blood Glucose checks as needed.  Exercise Prescription Changes: Exercise Prescription Changes    Row Name 10/11/18 1400 10/29/18 1600 11/14/18 1000         Response to Exercise   Blood Pressure (Admit)  122/74  -  134/56     Blood Pressure (Exercise)  138/84  -  138/70     Blood Pressure (Exit)  126/78  -  118/70     Heart Rate (Admit)  84 bpm  -  78 bpm     Heart Rate (Exercise)  107 bpm  -  134 bpm     Heart Rate (Exit)  88 bpm  -  95 bpm     Oxygen Saturation (Admit)  98 %  -  95 %     Oxygen Saturation  (Exercise)  90 %  -  94 %     Oxygen Saturation (Exit)  98 %  -  93 %     Rating of Perceived Exertion (Exercise)  13  -  15     Perceived Dyspnea (Exercise)  3  -  1     Symptoms  none  -  none     Comments  orientation  -  ellipitcal is tough     Duration  -  -  Continue with 30 min of aerobic exercise without signs/symptoms of physical distress.     Intensity  -  -  THRR unchanged       Progression   Progression  -  -  Continue to progress workloads to maintain intensity without signs/symptoms of physical distress.     Average METs  -  -  3.2       Resistance Training   Training Prescription  -  -  Yes     Weight  -  -  3 lbs     Reps  -  -  10-15       Interval Training  Interval Training  -  -  No       Treadmill   MPH  -  -  2.5     Grade  -  -  2     Minutes  -  -  15     METs  -  -  3.6       Recumbant Bike   Level  -  -  3     Watts  -  -  29     Minutes  -  -  15     METs  -  -  3.05       Elliptical   Level  -  -  1     Speed  -  -  3     Minutes  -  -  15       Biostep-RELP   Level  -  -  3     Minutes  -  -  15     METs  -  -  3       Home Exercise Plan   Plans to continue exercise at  -  Home (comment) walking, videos  Home (comment) walking, videos     Frequency  -  Add 2 additional days to program exercise sessions.  Add 2 additional days to program exercise sessions.     Initial Home Exercises Provided  -  10/29/18  10/29/18        Exercise Comments:   Exercise Goals and Review: Exercise Goals    Row Name 10/11/18 1506             Exercise Goals   Increase Physical Activity  Yes       Intervention  Provide advice, education, support and counseling about physical activity/exercise needs.;Develop an individualized exercise prescription for aerobic and resistive training based on initial evaluation findings, risk stratification, comorbidities and participant's personal goals.       Expected Outcomes  Short Term: Attend rehab on a regular  basis to increase amount of physical activity.;Long Term: Add in home exercise to make exercise part of routine and to increase amount of physical activity.;Long Term: Exercising regularly at least 3-5 days a week.       Increase Strength and Stamina  Yes       Intervention  Provide advice, education, support and counseling about physical activity/exercise needs.;Develop an individualized exercise prescription for aerobic and resistive training based on initial evaluation findings, risk stratification, comorbidities and participant's personal goals.       Expected Outcomes  Short Term: Increase workloads from initial exercise prescription for resistance, speed, and METs.;Short Term: Perform resistance training exercises routinely during rehab and add in resistance training at home;Long Term: Improve cardiorespiratory fitness, muscular endurance and strength as measured by increased METs and functional capacity (6MWT)       Able to understand and use rate of perceived exertion (RPE) scale  Yes       Intervention  Provide education and explanation on how to use RPE scale       Expected Outcomes  Short Term: Able to use RPE daily in rehab to express subjective intensity level;Long Term:  Able to use RPE to guide intensity level when exercising independently       Able to understand and use Dyspnea scale  Yes       Intervention  Provide education and explanation on how to use Dyspnea scale  Expected Outcomes  Short Term: Able to use Dyspnea scale daily in rehab to express subjective sense of shortness of breath during exertion;Long Term: Able to use Dyspnea scale to guide intensity level when exercising independently       Knowledge and understanding of Target Heart Rate Range (THRR)  Yes       Intervention  Provide education and explanation of THRR including how the numbers were predicted and where they are located for reference       Expected Outcomes  Short Term: Able to state/look up THRR;Short  Term: Able to use daily as guideline for intensity in rehab;Long Term: Able to use THRR to govern intensity when exercising independently       Able to check pulse independently  Yes       Intervention  Provide education and demonstration on how to check pulse in carotid and radial arteries.;Review the importance of being able to check your own pulse for safety during independent exercise       Expected Outcomes  Short Term: Able to explain why pulse checking is important during independent exercise;Long Term: Able to check pulse independently and accurately       Understanding of Exercise Prescription  Yes       Intervention  Provide education, explanation, and written materials on patient's individual exercise prescription       Expected Outcomes  Short Term: Able to explain program exercise prescription;Long Term: Able to explain home exercise prescription to exercise independently          Exercise Goals Re-Evaluation : Exercise Goals Re-Evaluation    Row Name 10/11/18 1506 10/17/18 1422 10/29/18 1606 11/13/18 1541       Exercise Goal Re-Evaluation   Exercise Goals Review  Increase Strength and Stamina;Able to understand and use rate of perceived exertion (RPE) scale;Able to understand and use Dyspnea scale;Knowledge and understanding of Target Heart Rate Range (THRR);Able to check pulse independently;Understanding of Exercise Prescription  Increase Physical Activity;Able to understand and use rate of perceived exertion (RPE) scale;Knowledge and understanding of Target Heart Rate Range (THRR);Understanding of Exercise Prescription;Increase Strength and Stamina;Able to understand and use Dyspnea scale;Able to check pulse independently  Increase Physical Activity;Able to understand and use rate of perceived exertion (RPE) scale;Knowledge and understanding of Target Heart Rate Range (THRR);Understanding of Exercise Prescription;Increase Strength and Stamina;Able to understand and use Dyspnea  scale;Able to check pulse independently  Increase Physical Activity;Increase Strength and Stamina;Understanding of Exercise Prescription    Comments  -  Reviewed RPE scale, THR and program prescription with pt today.  Pt voiced understanding and was given a copy of goals to take home.  [name] is off to a good start in rehab.  She wants to go ahead and start walking more on her off days.  Reviewed home exercise with pt today.  Pt plans to walk and use staff videos at home for exercise.  Reviewed THR, pulse, RPE, sign and symptoms, and when to call 911 or MD.  Also discussed weather considerations and indoor options.  Pt voiced understanding.  [name] has been doing well in rehab.  She is working hard to [employer], [employer] the elliptical and is determined to get to her full 15 min!  We will continue to monitor her progression.    Expected Outcomes  -  Short: Use RPE daily to regulate intensity. Long: Follow program prescription in THR.  Short: Add in exercise on off days from rehab.  Long: Continue to improve stamina.  Short: Return  to rehab regularly  Long: Continue to improve stamina and exercise for  mental boost       Discharge Exercise Prescription (Final Exercise Prescription Changes): Exercise Prescription Changes - 11/14/18 1000      Response to Exercise   Blood Pressure (Admit)  134/56    Blood Pressure (Exercise)  138/70    Blood Pressure (Exit)  118/70    Heart Rate (Admit)  78 bpm    Heart Rate (Exercise)  134 bpm    Heart Rate (Exit)  95 bpm    Oxygen Saturation (Admit)  95 %    Oxygen Saturation (Exercise)  94 %    Oxygen Saturation (Exit)  93 %    Rating of Perceived Exertion (Exercise)  15    Perceived Dyspnea (Exercise)  1    Symptoms  none    Comments  ellipitcal is tough    Duration  Continue with 30 min of aerobic exercise without signs/symptoms of physical distress.    Intensity  THRR unchanged      Progression   Progression  Continue to progress workloads to maintain intensity  without signs/symptoms of physical distress.    Average METs  3.2      Resistance Training   Training Prescription  Yes    Weight  3 lbs    Reps  10-15      Interval Training   Interval Training  No      Treadmill   MPH  2.5    Grade  2    Minutes  15    METs  3.6      Recumbant Bike   Level  3    Watts  29    Minutes  15    METs  3.05      Elliptical   Level  1    Speed  3    Minutes  15      Biostep-RELP   Level  3    Minutes  15    METs  3      Home Exercise Plan   Plans to continue exercise at  Home (comment)   walking, videos   Frequency  Add 2 additional days to program exercise sessions.    Initial Home Exercises Provided  10/29/18       Nutrition:  Target Goals: Understanding of nutrition guidelines, daily intake of sodium <1555m, cholesterol <2057m calories 30% from fat and 7% or less from saturated fats, daily to have 5 or more servings of fruits and vegetables.  Biometrics: Pre Biometrics - 10/11/18 1453      Pre Biometrics   Height  5' 0.5" (1.537 m)    Weight  210 lb 1.6 oz (95.3 kg)    BMI (Calculated)  40.34    Single Leg Stand  10.6 seconds        Nutrition Therapy Plan and Nutrition Goals: Nutrition Therapy & Goals - 10/11/18 1436      Nutrition Therapy   Diet  Low Na, HH diet    Protein (specify units)  75-80g    Fiber  25 grams    Whole Grain Foods  3 servings    Saturated Fats  12 max. grams    Fruits and Vegetables  5 servings/day    Sodium  1.5 grams      Personal Nutrition Goals   Nutrition Goal  ST: Pt wants to try hello fresh LT: Weight loss 50lb (5-10lbs now)    Comments  Pt  reports not eating that much during the day. B: cheerios with 2% milk. L/D: (2 meals per day) usually jimmy de[employer] or another kind of take out. For a snack sometimes she will have airpopped popcorn with her own seasoning. pt reports not cooking and having difficulty with motivation, pt reports hx of depression. Discussed HH eating, mindful  eating/intuitive eating, food anthropologist (no judgement), weight loss expectations, and about her support system.      Intervention Plan   Intervention  Prescribe, educate and counsel regarding individualized specific dietary modifications aiming towards targeted core components such as weight, hypertension, lipid management, diabetes, heart failure and other comorbidities.;Nutrition handout(s) given to patient.    Expected Outcomes  Short Term Goal: Understand basic principles of dietary content, such as calories, fat, sodium, cholesterol and nutrients.;Short Term Goal: A plan has been developed with personal nutrition goals set during dietitian appointment.;Long Term Goal: Adherence to prescribed nutrition plan.       Nutrition Assessments: Nutrition Assessments - 10/11/18 1459      MEDFICTS Scores   Pre Score  16       Nutrition Goals Re-Evaluation: Nutrition Goals Re-Evaluation    [place] Name 10/22/18 1444             Goals   Nutrition Goal  ST: Pt wants to try hello fresh LT: Weight loss 50lb (5-10lbs now)       Comment  Pt reports she and her husband have had a lot going on and she will get hello fresh within the next two weeks. Pt reports otherwise doing ok, talking with her doctor about therapy.       Expected Outcome  ST: Pt wants to try hello fresh LT: Weight loss 50lb (5-10lbs now)          Nutrition Goals Discharge (Final Nutrition Goals Re-Evaluation): Nutrition Goals Re-Evaluation - 10/22/18 1444      Goals   Nutrition Goal  ST: Pt wants to try hello fresh LT: Weight loss 50lb (5-10lbs now)    Comment  Pt reports she and her husband have had a lot going on and she will get hello fresh within the next two weeks. Pt reports otherwise doing ok, talking with her doctor about therapy.    Expected Outcome  ST: Pt wants to try hello fresh LT: Weight loss 50lb (5-10lbs now)       Psychosocial: Target Goals: Acknowledge presence or absence of significant depression  and/or stress, maximize coping skills, provide positive support system. Participant is able to verbalize types and ability to use techniques and skills needed for reducing stress and depression.   Initial Review & Psychosocial Screening: Initial Psych Review & Screening - 09/24/18 1737      Initial Review   Current issues with  Current Psychotropic Meds;Current Sleep Concerns;Current Depression;Current Stress Concerns    Comments  Stress is part of her nature      Family Dynamics   Good Support System?  Yes   Good friends,Husband, Twin Brother , Aunt     Barriers   Psychosocial barriers to participate in program  There are no identifiable barriers or psychosocial needs.;The patient should benefit from training in stress management and relaxation.      Screening Interventions   Interventions  Encouraged to exercise;To provide support and resources with identified psychosocial needs;Provide feedback about the scores to participant    Expected Outcomes  Short Term goal: Utilizing psychosocial counselor, staff and physician to assist with identification of specific Stressors or  current issues interfering with healing process. Setting desired goal for each stressor or current issue identified.;Long Term Goal: Stressors or current issues are controlled or eliminated.;Short Term goal: Identification and review with participant of any Quality of Life or Depression concerns found by scoring the questionnaire.;Long Term goal: The participant improves quality of Life and PHQ9 Scores as seen by post scores and/or verbalization of changes       Quality of Life Scores:  Quality of Life - 10/11/18 1458      Quality of Life   Select  Quality of Life      Scores of 19 and below usually indicate a poorer quality of life in these areas.  A difference of  2-3 points is a clinically meaningful difference.  A difference of 2-3 points in the total score of the Quality of Life Index has been associated with  significant improvement in overall quality of life, self-image, physical symptoms, and general health in studies assessing change in quality of life.  PHQ-9: Recent Review Flowsheet Data    Depression screen [facility] 2/9 10/29/2018   Decreased Interest 3   Down, Depressed, Hopeless 3   PHQ - 2 Score 6   Altered sleeping 1   Tired, decreased energy 3   Change in appetite 3   Feeling bad or failure about yourself  3   Trouble concentrating 3   Moving slowly or fidgety/restless 2   Suicidal thoughts 0   PHQ-9 Score 21   Difficult doing work/chores Very difficult     Interpretation of Total Score  Total Score Depression Severity:  1-4 = Minimal depression, 5-9 = Mild depression, 10-14 = Moderate depression, 15-19 = Moderately severe depression, 20-27 = Severe depression   Psychosocial Evaluation and Intervention: Psychosocial Evaluation - 10/11/18 1501      Psychosocial Evaluation & Interventions   Comments  Pt reports hx of depression with lack of motivation to do things that interferes with her life and pt mentioned her medications have not seemed to work at all despite being on them for 4-5 monhts; encouraged her to speak to her perscribing doctor. Discussed how counseling/therapy may also be beneficial ; encouraged her to ask her PMD.       Psychosocial Re-Evaluation: Psychosocial Re-Evaluation    Row Name 10/29/18 1529 11/13/18 1542           Psychosocial Re-Evaluation   Current issues with  Current Stress Concerns;Current Depression;Current Psychotropic Meds  Current Sleep Concerns      Comments  [name] is doing well in rehab. She continues to feel sad all the time, but does note that it is better on days she exercises, so she is going to try to exercise a little more often.  She feels that she puts on a happy face.  She is taking lexapro and wellbutrin.  The lexapro is newer.  The wellbutrin has been more long term and we talked about maybe talking with her doctor about uping  her dosage. Completed PHQ9 questionnarie today.  She uses medications to sleep and is completing a sleep study tonight at home.  [name] completed her sleep study and has been diagnosed with sleep apnea.  She will start using a CPAP.      Expected Outcomes  Short: Talk to doctor about meds.  Long: Continue work on improving depression.  Short: CPAP compliance.  Long: Improved sleep quality.      Interventions  Stress management education;Encouraged to attend Pulmonary Rehabilitation for the exercise  -  Continue Psychosocial Services   Follow up required by staff  Follow up required by staff      Comments  Stress is part of her nature  -         Psychosocial Discharge (Final Psychosocial Re-Evaluation): Psychosocial Re-Evaluation - 11/13/18 1542      Psychosocial Re-Evaluation   Current issues with  Current Sleep Concerns    Comments  [name] completed her sleep study and has been diagnosed with sleep apnea.  She will start using a CPAP.    Expected Outcomes  Short: CPAP compliance.  Long: Improved sleep quality.    Continue Psychosocial Services   Follow up required by staff       Vocational Rehabilitation: Provide vocational rehab assistance to qualifying candidates.   Vocational Rehab Evaluation & Intervention: Vocational Rehab - 09/24/18 1743      Initial Vocational Rehab Evaluation & Intervention   Assessment shows need for Vocational Rehabilitation  No       Education: Education Goals: Education classes will be provided on a variety of topics geared toward better understanding of heart health and risk factor modification. Participant will state understanding/return demonstration of topics presented as noted by education test scores.  Learning Barriers/Preferences: Learning Barriers/Preferences - 09/24/18 1740      Learning Barriers/Preferences   Learning Barriers  Hearing   ADD,  Words not always trasmit correctly when hearing or speaking   Learning Preferences   None       Education Topics:  AED/CPR: - Group verbal and written instruction with the use of models to demonstrate the basic use of the AED with the basic ABC's of resuscitation.   General Nutrition Guidelines/Fats and Fiber: -Group instruction provided by verbal, written material, models and posters to present the general guidelines for heart healthy nutrition. Gives an explanation and review of dietary fats and fiber.   Controlling Sodium/Reading Food Labels: -Group verbal and written material supporting the discussion of sodium use in heart healthy nutrition. Review and explanation with models, verbal and written materials for utilization of the food label.   Exercise Physiology & General Exercise Guidelines: - Group verbal and written instruction with models to review the exercise physiology of the cardiovascular system and associated critical values. Provides general exercise guidelines with specific guidelines to those with heart or lung disease.    Aerobic Exercise & Resistance Training: - Gives group verbal and written instruction on the various components of exercise. Focuses on aerobic and resistive training programs and the benefits of this training and how to safely progress through these programs..   Flexibility, Balance, Mind/Body Relaxation: Provides group verbal/written instruction on the benefits of flexibility and balance training, including mind/body exercise modes such as yoga, pilates and tai chi.  Demonstration and skill practice provided.   Stress and Anxiety: - Provides group verbal and written instruction about the health risks of elevated stress and causes of high stress.  Discuss the correlation between heart/lung disease and anxiety and treatment options. Review healthy ways to manage with stress and anxiety.   Depression: - Provides group verbal and written instruction on the correlation between heart/lung disease and depressed mood, treatment options,  and the stigmas associated with seeking treatment.   Anatomy & Physiology of the Heart: - Group verbal and written instruction and models provide basic cardiac anatomy and physiology, with the coronary electrical and arterial systems. Review of Valvular disease and Heart Failure   Cardiac Procedures: - Group verbal and written instruction to review commonly prescribed  medications for heart disease. Reviews the medication, class of the drug, and side effects. Includes the steps to properly store meds and maintain the prescription regimen. (beta blockers and nitrates)   Cardiac Medications I: - Group verbal and written instruction to review commonly prescribed medications for heart disease. Reviews the medication, class of the drug, and side effects. Includes the steps to properly store meds and maintain the prescription regimen.   Cardiac Medications II: -Group verbal and written instruction to review commonly prescribed medications for heart disease. Reviews the medication, class of the drug, and side effects. (all other drug classes)    Go Sex-Intimacy & Heart Disease, Get SMART - Goal Setting: - Group verbal and written instruction through game format to discuss heart disease and the return to sexual intimacy. Provides group verbal and written material to discuss and apply goal setting through the application of the S.M.A.R.T. Method.   Other Matters of the Heart: - Provides group verbal, written materials and models to describe Stable Angina and Peripheral Artery. Includes description of the disease process and treatment options available to the cardiac patient.   Exercise & Equipment Safety: - Individual verbal instruction and demonstration of equipment use and safety with use of the equipment.   Pulmonary Rehab from 10/11/2018 in [facility] Cardiac and Pulmonary Rehab  Date  10/11/18  Educator  AS  Instruction Review Code  1- Verbalizes Understanding      Infection Prevention: -  Provides verbal and written material to individual with discussion of infection control including proper hand washing and proper equipment cleaning during exercise session.   Pulmonary Rehab from 10/11/2018 in [facility] Cardiac and Pulmonary Rehab  Date  10/11/18  Educator  AS  Instruction Review Code  1- Verbalizes Understanding      Falls Prevention: - Provides verbal and written material to individual with discussion of falls prevention and safety.   Pulmonary Rehab from 10/11/2018 in [facility] Cardiac and Pulmonary Rehab  Date  10/11/18  Educator  AS  Instruction Review Code  1- Verbalizes Understanding      Diabetes: - Individual verbal and written instruction to review signs/symptoms of diabetes, desired ranges of glucose level fasting, after meals and with exercise. Acknowledge that pre and post exercise glucose checks will be done for 3 sessions at entry of program.   Know Your Numbers and Risk Factors: -Group verbal and written instruction about important numbers in your health.  Discussion of what are risk factors and how they play a role in the disease process.  Review of Cholesterol, Blood Pressure, Diabetes, and BMI and the role they play in your overall health.   Sleep Hygiene: -Provides group verbal and written instruction about how sleep can affect your health.  Define sleep hygiene, discuss sleep cycles and impact of sleep habits. Review good sleep hygiene tips.    Other: -Provides group and verbal instruction on various topics (see comments)   Knowledge Questionnaire Score: Knowledge Questionnaire Score - 10/11/18 1501      Knowledge Questionnaire Score   Pre Score  14/18       Core Components/Risk Factors/Patient Goals at Admission: Personal Goals and Risk Factors at Admission - 10/11/18 1457      Core Components/Risk Factors/Patient Goals on Admission    Weight Management  Yes;Weight Loss    Intervention  Weight Management: Develop a combined nutrition and  exercise program designed to reach desired caloric intake, while maintaining appropriate intake of nutrient and fiber, sodium and fats, and appropriate energy expenditure  required for the weight goal.;Weight Management: Provide education and appropriate resources to help participant work on and attain dietary goals.    Admit Weight  210 lb 1.6 oz (95.3 kg)    Goal Weight: Short Term  200 lb (90.7 kg)    Goal Weight: Long Term  190 lb (86.2 kg)    Expected Outcomes  Short Term: Continue to assess and modify interventions until short term weight is achieved;Long Term: Adherence to nutrition and physical activity/exercise program aimed toward attainment of established weight goal    Intervention  Provide education on lifestyle modifcations including regular physical activity/exercise, weight management, moderate sodium restriction and increased consumption of fresh fruit, vegetables, and low fat dairy, alcohol moderation, and smoking cessation.;Monitor prescription use compliance.    Expected Outcomes  Short Term: Continued assessment and intervention until BP is < 140/50m HG in hypertensive participants. < 130/8102mHG in hypertensive participants with diabetes, heart failure or chronic kidney disease.;Long Term: Maintenance of blood pressure at goal levels.    Intervention  Provide education and support for participant on nutrition & aerobic/resistive exercise along with prescribed medications to achieve LDL <7064mHDL >33m25m  Expected Outcomes  Short Term: Participant states understanding of desired cholesterol values and is compliant with medications prescribed. Participant is following exercise prescription and nutrition guidelines.;Long Term: Cholesterol controlled with medications as prescribed, with individualized exercise RX and with personalized nutrition plan. Value goals: LDL < 70mg87mL > 40 mg.       Core Components/Risk Factors/Patient Goals Review:  Goals and Risk Factor Review    Row  Name 10/29/18 1535             Core Components/Risk Factors/Patient Goals Review   Personal Goals Review  Weight Management/Obesity;Hypertension;Lipids       Review  MicheSharyn Lulloing well in weight.  Today she was up 2lbs but has started her cycle this week too, but no increased SOB.  Blood pressures are doing good, but she does not check at home as she does not have a cuff.  If she is out at a pharmacy she will get it checked.  She is doing well with her medications.       Expected Outcomes  Short: Continue to work on weight loss.  Long: Continue to montior risk factors.          Core Components/Risk Factors/Patient Goals at Discharge (Final Review):  Goals and Risk Factor Review - 10/29/18 1535      Core Components/Risk Factors/Patient Goals Review   Personal Goals Review  Weight Management/Obesity;Hypertension;Lipids    Review  MicheSharyn Lulloing well in weight.  Today she was up 2lbs but has started her cycle this week too, but no increased SOB.  Blood pressures are doing good, but she does not check at home as she does not have a cuff.  If she is out at a pharmacy she will get it checked.  She is doing well with her medications.    Expected Outcomes  Short: Continue to work on weight loss.  Long: Continue to montior risk factors.       ITP Comments: ITP Comments    Row Name 09/24/18 1751 10/11/18 1444 10/17/18 1422 11/07/18 1240 12/03/18 1419   ITP Comments  Virtual Orientation completed today for Pulmonary Rehab. EP/RD appt on 8/26  Documentation for diagnosis can be found in [facility] and 8/11  Initial 6MWT and nutrition consult completed. ITp created and sent to Dr MilleSabra Heck  review and sign.  First full day of exercise!  Patient was oriented to gym and equipment including functions, settings, policies, and procedures.  Patient's individual exercise prescription and treatment plan were reviewed.  All starting workloads were established based on the results of the 6 minute walk test  done at initial orientation visit.  The plan for exercise progression was also introduced and progression will be customized based on patient's performance and goals.  30 day review completed. ITP sent to Dr. Emily Filbert, Medical Director of Cardiac and Pulmonary Rehab. Continue with ITP unless changes are made by physician.  Department closed starting 10/2 until further notice by infection prevention and Health at Work teams for [place].  [name] called out today. She has not been able to be here this month due to caring for her aunt.   [place] Name 12/04/18 1151 12/05/18 0615 01/01/19 1144 01/01/19 1429     ITP Comments  Unable to complete nutrition 30-day re-eval cycle due to inconsistent attendence  30 day review completed. Continue with ITP sent to Dr. Emily Filbert, Medical Director of Cardiac and Pulmonary Rehab for review , changes as needed and signature.  Last visit 9/30  Calls have been made  Called to check on pt.  Out since 10/31/18 caring for her aunt.  Left message.  Sent message on MyChart.  [name] returned our message.  She would like to discharge at this time.       Comments: Discharge ITP

## 2019-01-01 NOTE — Progress Notes (Signed)
Discharge Progress Report  Patient Details  Name: Samantha Greene MRN: [id number] Date of Birth: Sep 30, 1970 Referring Provider:     Pulmonary Rehab from 10/11/2018 in [facility]  Referring Provider  [place]       Number of Visits: 7  Reason for Discharge:  Early Exit:  Personal and Lack of attendance  Smoking History:  Social History   Tobacco Use  Smoking Status Never Smoker  Smokeless Tobacco Never Used    Diagnosis:  DOE (dyspnea on exertion)  ADL UCSD: Pulmonary Assessment Scores    Row Name 10/11/18 1459         ADL [facility]   Rest  1     Walk  3     Stairs  5     Bath  1     Dress  2     Shop  3       CAT Score   CAT Score  25       mMRC Score   mMRC Score  2        Initial Exercise Prescription: Initial Exercise Prescription - 10/11/18 1400      Date of Initial Exercise RX and Referring Provider   Date  10/11/18    Referring Provider  [place]      Treadmill   MPH  2.4    Grade  0.5    Minutes  15    METs  3      Recumbant Bike   Level  3    RPM  60    Watts  45    Minutes  15    METs  3      Elliptical   Level  1    Speed  3    Minutes  15      Biostep-RELP   Level  3    SPM  50    Minutes  15    METs  3      Prescription Details   Frequency (times per week)  3    Duration  Progress to 30 minutes of continuous aerobic without signs/symptoms of physical distress      Intensity   THRR 40-80% of Max Heartrate  119-154    Ratings of Perceived Exertion  11-15    Perceived Dyspnea  0-4      Resistance Training   Training Prescription  Yes    Weight  3 lb    Reps  10-15       Discharge Exercise Prescription (Final Exercise Prescription Changes): Exercise Prescription Changes - 11/14/18 1000      Response to Exercise   Blood Pressure (Admit)  134/56    Blood Pressure (Exercise)  138/70    Blood Pressure (Exit)  118/70    Heart Rate (Admit)  78 bpm    Heart Rate (Exercise)  134 bpm    Heart  Rate (Exit)  95 bpm    Oxygen Saturation (Admit)  95 %    Oxygen Saturation (Exercise)  94 %    Oxygen Saturation (Exit)  93 %    Rating of Perceived Exertion (Exercise)  15    Perceived Dyspnea (Exercise)  1    Symptoms  none    Comments  ellipitcal is tough    Duration  Continue with 30 min of aerobic exercise without signs/symptoms of physical distress.    Intensity  THRR unchanged      Progression   Progression  Continue  to progress workloads to maintain intensity without signs/symptoms of physical distress.    Average METs  3.2      Resistance Training   Training Prescription  Yes    Weight  3 lbs    Reps  10-15      Interval Training   Interval Training  No      Treadmill   MPH  2.5    Grade  2    Minutes  15    METs  3.6      Recumbant Bike   Level  3    Watts  29    Minutes  15    METs  3.05      Elliptical   Level  1    Speed  3    Minutes  15      Biostep-RELP   Level  3    Minutes  15    METs  3      Home Exercise Plan   Plans to continue exercise at  Home (comment)   walking, videos   Frequency  Add 2 additional days to program exercise sessions.    Initial Home Exercises Provided  10/29/18       Functional Capacity: 6 Minute Walk    Row Name 10/11/18 1445         6 Minute Walk   Phase  Initial     Distance  1300 feet     Walk Time  6 minutes     # of Rest Breaks  0     MPH  2.46     METS  3.48     RPE  13     Perceived Dyspnea   3     VO2 Peak  12.2     Symptoms  No     Resting HR  84 bpm     Resting BP  122/74     Resting Oxygen Saturation   98 %     Exercise Oxygen Saturation  during 6 min walk  90 %     Max Ex. HR  107 bpm     Max Ex. BP  138/84     2 Minute Post BP  126/78       Interval HR   1 Minute HR  92     2 Minute HR  103     3 Minute HR  106     4 Minute HR  107     5 Minute HR  103     6 Minute HR  103     2 Minute Post HR  88     Interval Heart Rate?  Yes       Interval Oxygen   Interval Oxygen?  Yes      Baseline Oxygen Saturation %  98 %     1 Minute Oxygen Saturation %  91 %     1 Minute Liters of Oxygen  0 L     2 Minute Oxygen Saturation %  90 %     2 Minute Liters of Oxygen  0 L     3 Minute Oxygen Saturation %  91 %     3 Minute Liters of Oxygen  0 L     4 Minute Oxygen Saturation %  93 %     4 Minute Liters of Oxygen  0 L     5 Minute Oxygen Saturation %  92 %  5 Minute Liters of Oxygen  0 L     6 Minute Oxygen Saturation %  93 %     6 Minute Liters of Oxygen  0 L     2 Minute Post Oxygen Saturation %  98 %     2 Minute Post Liters of Oxygen  0 L        Psychological, QOL, Others - Outcomes: PHQ 2/9: Depression screen PHQ 2/9 10/29/2018  Decreased Interest 3  Down, Depressed, Hopeless 3  PHQ - 2 Score 6  Altered sleeping 1  Tired, decreased energy 3  Change in appetite 3  Feeling bad or failure about yourself  3  Trouble concentrating 3  Moving slowly or fidgety/restless 2  Suicidal thoughts 0  PHQ-9 Score 21  Difficult doing work/chores Very difficult  Some recent data might be hidden    Quality of Life: Quality of Life - 10/11/18 1458      Quality of Life   Select  Quality of Life       Personal Goals: Goals established at orientation with interventions provided to work toward goal. Personal Goals and Risk Factors at Admission - 10/11/18 1457      Core Components/Risk Factors/Patient Goals on Admission    Weight Management  Yes;Weight Loss    Intervention  Weight Management: Develop a combined nutrition and exercise program designed to reach desired caloric intake, while maintaining appropriate intake of nutrient and fiber, sodium and fats, and appropriate energy expenditure required for the weight goal.;Weight Management: Provide education and appropriate resources to help participant work on and attain dietary goals.    Admit Weight  210 lb 1.6 oz (95.3 kg)    Goal Weight: Short Term  200 lb (90.7 kg)    Goal Weight: Long Term  190 lb (86.2 kg)     Expected Outcomes  Short Term: Continue to assess and modify interventions until short term weight is achieved;Long Term: Adherence to nutrition and physical activity/exercise program aimed toward attainment of established weight goal    Intervention  Provide education on lifestyle modifcations including regular physical activity/exercise, weight management, moderate sodium restriction and increased consumption of fresh fruit, vegetables, and low fat dairy, alcohol moderation, and smoking cessation.;Monitor prescription use compliance.    Expected Outcomes  Short Term: Continued assessment and intervention until BP is < 140/2mm HG in hypertensive participants. < 130/21mm HG in hypertensive participants with diabetes, heart failure or chronic kidney disease.;Long Term: Maintenance of blood pressure at goal levels.    Intervention  Provide education and support for participant on nutrition & aerobic/resistive exercise along with prescribed medications to achieve LDL 70mg , HDL >40mg .    Expected Outcomes  Short Term: Participant states understanding of desired cholesterol values and is compliant with medications prescribed. Participant is following exercise prescription and nutrition guidelines.;Long Term: Cholesterol controlled with medications as prescribed, with individualized exercise RX and with personalized nutrition plan. Value goals: LDL < , HDL > 40 mg.        Personal Goals Discharge: Goals and Risk Factor Review    Row Name 10/29/18 1535             Core Components/Risk Factors/Patient Goals Review   Personal Goals Review  Weight Management/Obesity;Hypertension;Lipids       Review  Marcelino Duster is doing well in weight.  Today she was up 2lbs but has started her cycle this week too, but no increased SOB.  Blood pressures are doing good, but she does not check at  home as she does not have a cuff.  If she is out at a pharmacy she will get it checked.  She is doing well with her medications.        Expected Outcomes  Short: Continue to work on weight loss.  Long: Continue to montior risk factors.          Exercise Goals and Review: Exercise Goals    Row Name 10/11/18 1506             Exercise Goals   Increase Physical Activity  Yes       Intervention  Provide advice, education, support and counseling about physical activity/exercise needs.;Develop an individualized exercise prescription for aerobic and resistive training based on initial evaluation findings, risk stratification, comorbidities and participant's personal goals.       Expected Outcomes  Short Term: Attend rehab on a regular basis to increase amount of physical activity.;Long Term: Add in home exercise to make exercise part of routine and to increase amount of physical activity.;Long Term: Exercising regularly at least 3-5 days a week.       Increase Strength and Stamina  Yes       Intervention  Provide advice, education, support and counseling about physical activity/exercise needs.;Develop an individualized exercise prescription for aerobic and resistive training based on initial evaluation findings, risk stratification, comorbidities and participant's personal goals.       Expected Outcomes  Short Term: Increase workloads from initial exercise prescription for resistance, speed, and METs.;Short Term: Perform resistance training exercises routinely during rehab and add in resistance training at home;Long Term: Improve cardiorespiratory fitness, muscular endurance and strength as measured by increased METs and functional capacity (6MWT)       Able to understand and use rate of perceived exertion (RPE) scale  Yes       Intervention  Provide education and explanation on how to use RPE scale       Expected Outcomes  Short Term: Able to use RPE daily in rehab to express subjective intensity level;Long Term:  Able to use RPE to guide intensity level when exercising independently       Able to understand and use Dyspnea  scale  Yes       Intervention  Provide education and explanation on how to use Dyspnea scale       Expected Outcomes  Short Term: Able to use Dyspnea scale daily in rehab to express subjective sense of shortness of breath during exertion;Long Term: Able to use Dyspnea scale to guide intensity level when exercising independently       Knowledge and understanding of Target Heart Rate Range (THRR)  Yes       Intervention  Provide education and explanation of THRR including how the numbers were predicted and where they are located for reference       Expected Outcomes  Short Term: Able to state/look up THRR;Short Term: Able to use daily as guideline for intensity in rehab;Long Term: Able to use THRR to govern intensity when exercising independently       Able to check pulse independently  Yes       Intervention  Provide education and demonstration on how to check pulse in carotid and radial arteries.;Review the importance of being able to check your own pulse for safety during independent exercise       Expected Outcomes  Short Term: Able to explain why pulse checking is important during independent exercise;Long Term: Able to check pulse independently and  accurately       Understanding of Exercise Prescription  Yes       Intervention  Provide education, explanation, and written materials on patient's individual exercise prescription       Expected Outcomes  Short Term: Able to explain program exercise prescription;Long Term: Able to explain home exercise prescription to exercise independently          Exercise Goals Re-Evaluation: Exercise Goals Re-Evaluation    Row Name 10/11/18 1506 10/17/18 1422 10/29/18 1606 11/13/18 1541       Exercise Goal Re-Evaluation   Exercise Goals Review  Increase Strength and Stamina;Able to understand and use rate of perceived exertion (RPE) scale;Able to understand and use Dyspnea scale;Knowledge and understanding of Target Heart Rate Range (THRR);Able to check pulse  independently;Understanding of Exercise Prescription  Increase Physical Activity;Able to understand and use rate of perceived exertion (RPE) scale;Knowledge and understanding of Target Heart Rate Range (THRR);Understanding of Exercise Prescription;Increase Strength and Stamina;Able to understand and use Dyspnea scale;Able to check pulse independently  Increase Physical Activity;Able to understand and use rate of perceived exertion (RPE) scale;Knowledge and understanding of Target Heart Rate Range (THRR);Understanding of Exercise Prescription;Increase Strength and Stamina;Able to understand and use Dyspnea scale;Able to check pulse independently  Increase Physical Activity;Increase Strength and Stamina;Understanding of Exercise Prescription    Comments  -  Reviewed RPE scale, THR and program prescription with pt today.  Pt voiced understanding and was given a copy of goals to take home.  Marcelino DusterMichelle is off to a good start in rehab.  She wants to go ahead and start walking more on her off days.  Reviewed home exercise with pt today.  Pt plans to walk and use staff videos at home for exercise.  Reviewed THR, pulse, RPE, sign and symptoms, and when to call 911 or MD.  Also discussed weather considerations and indoor options.  Pt voiced understanding.  Marcelino DusterMichelle has been doing well in rehab.  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  We will continue to monitor her progression.    Expected Outcomes  -  Short: Use RPE daily to regulate intensity. Long: Follow program prescription in THR.  Short: Add in exercise on off days from rehab.  Long: Continue to improve stamina.  Short: Return to rehab regularly  Long: Continue to improve stamina and exercise for  mental boost       Nutrition & Weight - Outcomes: Pre Biometrics - 10/11/18 1453      Pre Biometrics   Height  5' 0.5" (1.537 m)    Weight  210 lb 1.6 oz (95.3 kg)    BMI (Calculated)  40.34    Single Leg Stand   10.6 seconds        Nutrition: Nutrition Therapy & Goals - 10/11/18 1436      Nutrition Therapy   Diet  Low Na, HH diet    Protein (specify units)  75-80g    Fiber  25 grams    Whole Grain Foods  3 servings    Saturated Fats  12 max. grams    Fruits and Vegetables  5 servings/day    Sodium  1.5 grams      Personal Nutrition Goals   Nutrition Goal  ST: Pt wants to try hello fresh LT: Weight loss 50lb (5-10lbs now)    Comments  Pt reports not eating that much during the day. B: cheerios with 2% milk. L/D: (  2 meals per day) usually [employer] or another kind of take out. For a snack sometimes she will have airpopped popcorn with her own seasoning. pt reports not cooking and having difficulty with motivation, pt reports hx of depression. Discussed HH eating, mindful eating/intuitive eating, food anthropologist (no judgement), weight loss expectations, and about her support system.      Intervention Plan   Intervention  Prescribe, educate and counsel regarding individualized specific dietary modifications aiming towards targeted core components such as weight, hypertension, lipid management, diabetes, heart failure and other comorbidities.;Nutrition handout(s) given to patient.    Expected Outcomes  Short Term Goal: Understand basic principles of dietary content, such as calories, fat, sodium, cholesterol and nutrients.;Short Term Goal: A plan has been developed with personal nutrition goals set during dietitian appointment.;Long Term Goal: Adherence to prescribed nutrition plan.       Nutrition Discharge: Nutrition Assessments - 10/11/18 1459      MEDFICTS Scores   Pre Score  16       Education Questionnaire Score: Knowledge Questionnaire Score - 10/11/18 1501      Knowledge Questionnaire Score   Pre Score  14/18       Goals reviewed with patient; copy given to patient.

## 2019-01-01 NOTE — Telephone Encounter (Signed)
Called to check on pt.  Out since 10/31/18 caring for her aunt.  Left message.  Sent message on MyChart.

## 2019-01-03 ENCOUNTER — Ambulatory Visit: Payer: BC Managed Care – PPO | Admitting: Adult Health

## 2019-01-03 ENCOUNTER — Encounter: Payer: Self-pay | Admitting: Physician Assistant

## 2019-01-03 ENCOUNTER — Encounter: Payer: Self-pay | Admitting: Adult Health

## 2019-01-03 ENCOUNTER — Other Ambulatory Visit: Payer: Self-pay

## 2019-01-03 ENCOUNTER — Ambulatory Visit: Payer: Self-pay | Admitting: Adult Health

## 2019-01-03 ENCOUNTER — Ambulatory Visit: Payer: BC Managed Care – PPO | Admitting: Physician Assistant

## 2019-01-03 VITALS — BP 110/70 | HR 74 | Temp 97.5°F | Resp 14 | Wt 218.8 lb

## 2019-01-03 VITALS — BP 110/80 | HR 87 | Temp 97.2°F | Ht 61.0 in | Wt 217.8 lb

## 2019-01-03 DIAGNOSIS — M542 Cervicalgia: Secondary | ICD-10-CM

## 2019-01-03 DIAGNOSIS — Z9989 Dependence on other enabling machines and devices: Secondary | ICD-10-CM

## 2019-01-03 DIAGNOSIS — R5382 Chronic fatigue, unspecified: Secondary | ICD-10-CM | POA: Diagnosis not present

## 2019-01-03 DIAGNOSIS — G4730 Sleep apnea, unspecified: Secondary | ICD-10-CM | POA: Diagnosis not present

## 2019-01-03 MED ORDER — DEXAMETHASONE SODIUM PHOSPHATE 100 MG/10ML IJ SOLN
10.0000 mg | Freq: Once | INTRAMUSCULAR | Status: AC
  Administered 2019-01-03: 10 mg via INTRAMUSCULAR

## 2019-01-03 NOTE — Patient Instructions (Signed)
Your Plan:  Continue ongoing use of CPAP for adequate sleep apnea management  Continue to contact her DME company for any CPAP concerns or needed equipment   Follow-up in 6 months or call earlier if needed     Thank you for coming to see [place] at [facility]. I hope we have been able to provide you high quality care today.  You may receive a patient satisfaction survey over the next few weeks. We would appreciate your feedback and comments so that we may continue to improve ourselves and the health of our patients.

## 2019-01-03 NOTE — Patient Instructions (Signed)
Try the exercises and other information below, meloxicam once during the day as needed (avoid taking other NSAIDS like Alleve or Ibuprofen while taking this) and then baclofen if needed at bedtime for muscle spasm. This can be taken up to every 8 hours, but causes sedation, so should not drive or operate heavy machinery while taking this medicine.   Go to the ER if you have any new weakness in your arms, trouble with your grip, worse headache ever, fever, chills. or have worsening pain.    Cervical Sprain A cervical sprain is a stretch or tear in one or more of the tough, cord-like tissues that connect bones (ligaments) in the neck. Cervical sprains can range from mild to severe. Severe cervical sprains can cause the spinal bones (vertebrae) in the neck to be unstable. This can lead to spinal cord damage and can result in serious nervous system problems. The amount of time that it takes for a cervical sprain to get better depends on the cause and extent of the injury. Most cervical sprains heal in 4-6 weeks. What are the causes? Cervical sprains may be caused by an injury (trauma), such as from a motor vehicle accident, a fall, or sudden forward and backward whipping movement of the head and neck (whiplash injury). Mild cervical sprains may be caused by wear and tear over time, such as from poor posture, sitting in a chair that does not provide support, or looking up or down for long periods of time. What increases the risk? The following factors may make you more likely to develop this condition:  Participating in activities that have a high risk of trauma to the neck. These include contact sports, auto racing, gymnastics, and diving.  Taking risks when driving or riding in a motor vehicle, such as speeding.  Having osteoarthritis of the spine.  Having poor strength and flexibility of the neck.  A previous neck injury.  Having poor posture.  Spending a lot of time in certain positions that  put stress on the neck, such as sitting at a computer for long periods of time.  What are the signs or symptoms? Symptoms of this condition include:  Pain, soreness, stiffness, tenderness, swelling, or a burning sensation in the front, back, or sides of the neck.  Sudden tightening of neck muscles that you cannot control (muscle spasms).  Pain in the shoulders or upper back.  Limited ability to move the neck.  Headache.  Dizziness.  Nausea.  Vomiting.  Weakness, numbness, or tingling in a hand or an arm.  Symptoms may develop right away after injury, or they may develop over a few days. In some cases, symptoms may go away with treatment and return (recur) over time. How is this diagnosed? This condition may be diagnosed based on:  Your medical history.  Your symptoms.  Any recent injuries or known neck problems that you have, such as arthritis in the neck.  A physical exam.  Imaging tests, such as: ? X-rays. ? MRI. ? CT scan.  How is this treated? This condition is treated by resting and icing the injured area and doing physical therapy exercises. Depending on the severity of your condition, treatment may also include:  Keeping your neck in place (immobilized) for periods of time. This may be done using: ? A cervical collar. This supports your chin and the back of your head. ? A cervical traction device. This is a sling that holds up your head. This removes weight and pressure from  your neck, and it may help to relieve pain.  Medicines that help to relieve pain and inflammation.  Medicines that help to relax your muscles (muscle relaxants).  Surgery. This is rare.  Follow these instructions at home: If you have a cervical collar:  Wear it as told by your health care provider. Do not remove the collar unless instructed by your health care provider.  Ask your health care provider before you make any adjustments to your collar.  If you have long hair, keep it  outside of the collar.  Ask your health care provider if you can remove the collar for cleaning and bathing. If you are allowed to remove the collar for cleaning or bathing: ? Follow instructions from your health care provider about how to remove the collar safely. ? Clean the collar by wiping it with mild soap and water and drying it completely. ? If your collar has removable pads, remove them every 1-2 days and wash them by hand with soap and water. Let them air-dry completely before you put them back in the collar. ? Check your skin under the collar for irritation or sores. If you see any, tell your health care provider. Managing pain, stiffness, and swelling  If directed, use a cervical traction device as told by your health care provider.  If directed, apply heat to the affected area before you do your physical therapy or as often as told by your health care provider. Use the heat source that your health care provider recommends, such as a moist heat pack or a heating pad. ? Place a towel between your skin and the heat source. ? Leave the heat on for 20-30 minutes. ? Remove the heat if your skin turns bright red. This is especially important if you are unable to feel pain, heat, or cold. You may have a greater risk of getting burned.  If directed, put ice on the affected area: ? Put ice in a plastic bag. ? Place a towel between your skin and the bag. ? Leave the ice on for 20 minutes, 2-3 times a day. Activity  Do not drive while wearing a cervical collar. If you do not have a cervical collar, ask your health care provider if it is safe to drive while your neck heals.  Do not drive or use heavy machinery while taking prescription pain medicine or muscle relaxants, unless your health care provider approves.  Do not lift anything that is heavier than 10 lb (4.5 kg) until your health care provider tells you that it is safe.  Rest as directed by your health care provider. Avoid positions  and activities that make your symptoms worse. Ask your health care provider what activities are safe for you.  If physical therapy was prescribed, do exercises as told by your health care provider or physical therapist. General instructions  Take over-the-counter and prescription medicines only as told by your health care provider.  Do not use any products that contain nicotine or tobacco, such as cigarettes and e-cigarettes. These can delay healing. If you need help quitting, ask your health care provider.  Keep all follow-up visits as told by your health care provider or physical therapist. This is important. How is this prevented? To prevent a cervical sprain from happening again:  Use and maintain good posture. Make any needed adjustments to your workstation to help you use good posture.  Exercise regularly as directed by your health care provider or physical therapist.  Avoid risky activities  that may cause a cervical sprain.  Contact a health care provider if:  You have symptoms that get worse or do not get better after 2 weeks of treatment.  You have pain that gets worse or does not get better with medicine.  You develop new, unexplained symptoms.  You have sores or irritated skin on your neck from wearing your cervical collar. Get help right away if:  You have severe pain.  You develop numbness, tingling, or weakness in any part of your body.  You cannot move a part of your body (you have paralysis).  You have neck pain along with: ? Severe dizziness. ? Headache. Summary  A cervical sprain is a stretch or tear in one or more of the tough, cord-like tissues that connect bones (ligaments) in the neck.  Cervical sprains may be caused by an injury (trauma), such as from a motor vehicle accident, a fall, or sudden forward and backward whipping movement of the head and neck (whiplash injury).  Symptoms may develop right away after injury, or they may develop over a few  days.  This condition is treated by resting and icing the injured area and doing physical therapy exercises. This information is not intended to replace advice given to you by your health care provider. Make sure you discuss any questions you have with your health care provider. Document Released: 11/14/2006 Document Revised: 09/16/2015 Document Reviewed: 09/16/2015 [employer] Interactive Patient Education  2017 [employer]   Cervical Strain and Sprain Rehab Ask your health care provider which exercises are safe for you. Do exercises exactly as told by your health care provider and adjust them as directed. It is normal to feel mild stretching, pulling, tightness, or discomfort as you do these exercises, but you should stop right away if you feel sudden pain or your pain gets worse.Do not begin these exercises until told by your health care provider. Stretching and range of motion exercises These exercises warm up your muscles and joints and improve the movement and flexibility of your neck. These exercises also help to relieve pain, numbness, and tingling. Exercise A: Cervical side bend  1. Using good posture, sit on a stable chair or stand up. 2. Without moving your shoulders, slowly tilt your left / right ear to your shoulder until you feel a stretch in your neck muscles. You should be looking straight ahead. 3. Hold for __________ seconds. 4. Repeat with the other side of your neck. Repeat __________ times. Complete this exercise __________ times a day. Exercise B: Cervical rotation  1. Using good posture, sit on a stable chair or stand up. 2. Slowly turn your head to the side as if you are looking over your left / right shoulder. ? Keep your eyes level with the ground. ? Stop when you feel a stretch along the side and the back of your neck. 3. Hold for __________ seconds. 4. Repeat this by turning to your other side. Repeat __________ times. Complete this exercise __________ times a  day. Exercise C: Thoracic extension and pectoral stretch 1. Roll a towel or a small blanket so it is about 4 inches (10 cm) in diameter. 2. Lie down on your back on a firm surface. 3. Put the towel lengthwise, under your spine in the middle of your back. It should not be not under your shoulder blades. The towel should line up with your spine from your middle back to your lower back. 4. Put your hands behind your head and let  your elbows fall out to your sides. 5. Hold for __________ seconds. Repeat __________ times. Complete this exercise __________ times a day. Strengthening exercises These exercises build strength and endurance in your neck. Endurance is the ability to use your muscles for a long time, even after your muscles get tired. Exercise D: Upper cervical flexion, isometric 1. Lie on your back with a thin pillow behind your head and a small rolled-up towel under your neck. 2. Gently tuck your chin toward your chest and nod your head down to look toward your feet. Do not lift your head off the pillow. 3. Hold for __________ seconds. 4. Release the tension slowly. Relax your neck muscles completely before you repeat this exercise. Repeat __________ times. Complete this exercise __________ times a day. Exercise E: Cervical extension, isometric  1. Stand about 6 inches (15 cm) away from a wall, with your back facing the wall. 2. Place a soft object, about 6-8 inches (15-20 cm) in diameter, between the back of your head and the wall. A soft object could be a small pillow, a ball, or a folded towel. 3. Gently tilt your head back and press into the soft object. Keep your jaw and forehead relaxed. 4. Hold for __________ seconds. 5. Release the tension slowly. Relax your neck muscles completely before you repeat this exercise. Repeat __________ times. Complete this exercise __________ times a day. Posture and body mechanics  Body mechanics refers to the movements and positions of your  body while you do your daily activities. Posture is part of body mechanics. Good posture and healthy body mechanics can help to relieve stress in your body's tissues and joints. Good posture means that your spine is in its natural S-curve position (your spine is neutral), your shoulders are pulled back slightly, and your head is not tipped forward. The following are general guidelines for applying improved posture and body mechanics to your everyday activities. Standing  When standing, keep your spine neutral and keep your feet about hip-width apart. Keep a slight bend in your knees. Your ears, shoulders, and hips should line up.  When you do a task in which you stand in one place for a long time, place one foot up on a stable object that is 2-4 inches (5-10 cm) high, such as a footstool. This helps keep your spine neutral. Sitting   When sitting, keep your spine neutral and your keep feet flat on the floor. Use a footrest, if necessary, and keep your thighs parallel to the floor. Avoid rounding your shoulders, and avoid tilting your head forward.  When working at a desk or a computer, keep your desk at a height where your hands are slightly lower than your elbows. Slide your chair under your desk so you are close enough to maintain good posture.  When working at a computer, place your monitor at a height where you are looking straight ahead and you do not have to tilt your head forward or downward to look at the screen. Resting When lying down and resting, avoid positions that are most painful for you. Try to support your neck in a neutral position. You can use a contour pillow or a small rolled-up towel. Your pillow should support your neck but not push on it. This information is not intended to replace advice given to you by your health care provider. Make sure you discuss any questions you have with your health care provider. Document Released: 01/17/2005 Document Revised: 09/24/2015 Document  Reviewed: 12/24/2014  [employer] Patient Education  [employer].
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
## 2019-01-03 NOTE — Progress Notes (Signed)
Subjective:    Patient ID: Samantha Greene, female    DOB: Dec 19, 1970, 48 y.o.   MRN: [id number]  HPI 48 y.o. right handed WF presents with neck pain and right arm weakness.  She states off and on has had neck pain for years however Monday she was cleaning and started to have right upper neck/back pain, then Tuesday she woke up and had right neck pain. States the pain has improved however she continues to "feel funny" in her neck/shoulder and has right arm weakness. States she is having trouble picking up water or opening a door, has not dropped anything.  Denies worse headache ever, fever, chills.  She completed prednisone course yesterday, on flexeril but prefers baclofen.   No imaging.   Blood pressure 110/70, pulse 74, temperature (!) 97.5 F (36.4 C), resp. rate 14, weight 218 lb 12.8 oz (99.2 kg).  Medications   Current Outpatient Medications (Cardiovascular):  .  rosuvastatin (CRESTOR) 5 MG tablet, Take 1 tablet (5 mg total) by mouth at bedtime. Marland Kitchen  telmisartan (MICARDIS) 40 MG tablet, Take 1 tablet Daily for BP  Current Outpatient Medications (Respiratory):  .  albuterol (VENTOLIN HFA) 108 (90 Base) MCG/ACT inhaler, Inhale 2 puffs into the lungs every 4 (four) hours as needed for wheezing or shortness of breath. .  cetirizine (ZYRTEC) 10 MG tablet, Take 10 mg by mouth at bedtime. .  montelukast (SINGULAIR) 10 MG tablet, Take 1 tablet (10 mg total) by mouth at bedtime. Take 1 tablet daily for Allergies .  fluticasone (FLONASE) 50 MCG/ACT nasal spray, Place 2 sprays into both nostrils daily.  Current Outpatient Medications (Analgesics):  .  aspirin 81 MG tablet, Take 81 mg by mouth daily. .  meloxicam (MOBIC) 15 MG tablet, Take 1/2 to 1 tablet Daily for Pain & Inflammation & limit to 4-5 tablets  /week to Avoid Kidney Damage .  acetaminophen (TYLENOL) 500 MG tablet, Take 2 tablets (1,000 mg total) by mouth every 6 (six) hours as needed.   Current Outpatient Medications  (Other):  .  baclofen (LIORESAL) 10 MG tablet, Take 1/2 to 1 tablet 1 or 2 x /day as needed for Muscle Spasms .  buPROPion (WELLBUTRIN XL) 300 MG 24 hr tablet, Take 1 tablet every Morning for Mood, Focus & Concentration .  Cholecalciferol (VITAMIN D) 125 MCG (5000 UT) CAPS, Take 5,000 Units by mouth daily.  Marland Kitchen  escitalopram (LEXAPRO) 20 MG tablet, TAKE 1 TABLET BY MOUTH EVERY DAY .  Eszopiclone 3 MG TABS, TAKE 1 TABLET BY MOUTH AT BEDTIME AS NEEDED. TAKE IMMEDIATELY BEFORE BEDTIME .  fluocinolone (SYNALAR) 0.025 % ointment, APPLY TO AFFECTED AREA TWICE A DAY .  gabapentin (NEURONTIN) 300 MG capsule, Take 1 capsule (300 mg total) by mouth 3 (three) times daily. (Patient taking differently: Take 300 mg by mouth 3 (three) times daily. Patient taking BID) .  nystatin cream (MYCOSTATIN), Apply 1 application topically 2 (two) times daily. (Patient taking differently: Apply 1 application topically 2 (two) times daily as needed for dry skin. ) .  omeprazole (PRILOSEC) 40 MG capsule, TAKE 1 CAPSULE BY MOUTH EVERY DAY .  triamcinolone ointment (KENALOG) 0.1 %, Apply 1 application topically 2 (two) times daily. (Patient taking differently: Apply 1 application topically daily as needed (rash). ) .  vitamin C (ASCORBIC ACID) 500 MG tablet, Take 500 mg by mouth daily. .  vitamin E 600 UNIT capsule, Take 600 Units by mouth daily.  Problem list She has Insomnia; Depression; Hyperlipidemia; Hypertension;  Anxiety; Dyspnea; Vitamin D deficiency; Psoriasis, guttate; Morbid obesity ([place]); Carpal tunnel syndrome on both sides; Family history of breast cancer; Family history of colon cancer; Family history of prostate cancer; Family history of lung cancer; Family history of pancreatic cancer; Genetic testing; S/P Thoracotomy with Plication of Diaphragm; Chronic insomnia; Class 2 obesity with alveolar hypoventilation, serious comorbidity, and body mass index (BMI) of 38.0 to 38.9 in adult [facility]); SOB (shortness of breath) on  exertion; and OSA on CPAP on their problem list.  Review of Systems     Objective:   Physical Exam Constitutional:      Appearance: She is obese.  HENT:     Head: Normocephalic and atraumatic.     Mouth/Throat:     Mouth: Mucous membranes are moist.     Pharynx: No oropharyngeal exudate or posterior oropharyngeal erythema.  Eyes:     Pupils: Pupils are equal, round, and reactive to light.  Neck:     Musculoskeletal: Neck supple. Decreased range of motion (to the right). Pain with movement and muscular tenderness present. No neck rigidity or spinous process tenderness.  Cardiovascular:     Rate and Rhythm: Normal rate and regular rhythm.  Pulmonary:     Effort: Pulmonary effort is normal.     Breath sounds: Normal breath sounds.  Musculoskeletal:     Comments: limited right rotation, without spinous process tenderness, with paraspinal muscle tenderness the right side, normal sensation, reflexes, and pulses distal. She does have decreased strength with pincer grasp on right.    Lymphadenopathy:     Cervical: No cervical adenopathy.  Neurological:     Mental Status: She is alert.      Assessment & Plan:  Diagnoses and all orders for this visit:  Neck pain on right side -     dexamethasone (DECADRON) injection 10 mg -     Has OV with ortho next week, can get Xray there -     no red flags with the exam -     Ambulatory referral to Physical Therapy Go to the ER if you have any new weakness in your arms, trouble with your grip, worse headache ever, fever, chills. or have worsening pain.   Verbal consent obtained, risk and benefits were addressed with patient. A trigger point injection was performed at the site of maximal tenderness using 1% plain Lidocaine and Dexamethasone. This was well tolerated, and followed by immediate relief of pain. Return precautions discussed with the patient.    Future Appointments  Date Time Provider [place]  01/09/2019  9:00 AM Marlou Sa  Tonna Corner, MD [facility] None  01/17/2019  2:00 PM Princess Bruins, MD [facility]  03/18/2019 10:00 AM Vicie Mutters, PA-C [facility] None  07/09/2019  3:45 PM Frann Rider, NP [facility] None

## 2019-01-09 ENCOUNTER — Other Ambulatory Visit: Payer: Self-pay

## 2019-01-09 ENCOUNTER — Ambulatory Visit (INDEPENDENT_AMBULATORY_CARE_PROVIDER_SITE_OTHER): Payer: BC Managed Care – PPO

## 2019-01-09 ENCOUNTER — Ambulatory Visit: Payer: BC Managed Care – PPO | Admitting: Orthopedic Surgery

## 2019-01-09 ENCOUNTER — Encounter: Payer: Self-pay | Admitting: Orthopedic Surgery

## 2019-01-09 DIAGNOSIS — R202 Paresthesia of skin: Secondary | ICD-10-CM | POA: Diagnosis not present

## 2019-01-09 DIAGNOSIS — M542 Cervicalgia: Secondary | ICD-10-CM

## 2019-01-09 DIAGNOSIS — R200 Anesthesia of skin: Secondary | ICD-10-CM

## 2019-01-09 DIAGNOSIS — M5410 Radiculopathy, site unspecified: Secondary | ICD-10-CM
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
## 2019-01-11 ENCOUNTER — Encounter: Payer: Self-pay | Admitting: Orthopedic Surgery

## 2019-01-12 ENCOUNTER — Encounter: Payer: Self-pay | Admitting: Orthopedic Surgery

## 2019-01-17 ENCOUNTER — Ambulatory Visit (INDEPENDENT_AMBULATORY_CARE_PROVIDER_SITE_OTHER): Payer: BC Managed Care – PPO | Admitting: Obstetrics & Gynecology

## 2019-01-17 ENCOUNTER — Encounter: Payer: Self-pay | Admitting: Obstetrics & Gynecology

## 2019-01-17 ENCOUNTER — Other Ambulatory Visit: Payer: Self-pay

## 2019-01-17 VITALS — BP 130/80 | Ht 61.0 in | Wt 220.0 lb

## 2019-01-17 DIAGNOSIS — Z6841 Body Mass Index (BMI) 40.0 and over, adult: Secondary | ICD-10-CM

## 2019-01-17 DIAGNOSIS — Z01419 Encounter for gynecological examination (general) (routine) without abnormal findings: Secondary | ICD-10-CM | POA: Diagnosis not present

## 2019-01-17 DIAGNOSIS — Z789 Other specified health status: Secondary | ICD-10-CM

## 2019-01-17 NOTE — Patient Instructions (Signed)
1. Well female exam with routine gynecological exam Normal gynecologic exam.  Pap test December 2019 was negative, will repeat Pap test at 2 to 3 years.  Breast exam normal.  Overdue for screening mammography, will schedule now.  Health labs with family physician.  2. Use of condoms for contraception  3. Class 3 severe obesity due to excess calories with serious comorbidity and body mass index (BMI) of 40.0 to 44.9 in adult [facility]) Recommend a lower calorie/carb diet such as [employer].  Aerobic physical activities 5 times a week and light weightlifting every 2 days.  Caelyn, it was a pleasure seeing you today!

## 2019-01-17 NOTE — Progress Notes (Signed)
Samantha Greene 1970/04/28 [id number]   History:    48 y.o. G1P0A1L0 Married  RP:  Established patient presenting for annual gyn exam   HPI:  Menses regular normal every month.  No breakthrough bleeding.  No pelvic pain.  Rarely sexually active, will use condoms.  Urine and bowel movements normal.  Breasts normal.  Mother with Breast Cancer.  Genetic screening negative.  Body mass index increased to 41.57.  Not physically active.  Liver herniated in chest cavity, surgery successful in 2020.  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  Planning to increase fitness activities with Pneumo Rehab and PT 02/2019.  Health labs with family physician.     Past medical history,surgical history, family history and social history were all reviewed and documented in the EPIC chart.  Gynecologic History Patient's last menstrual period was 01/08/2019.  Obstetric History OB History  Gravida Para Term Preterm AB Living  1 0     0 0  SAB TAB Ectopic Multiple Live Births      0        # Outcome Date GA Lbr Len/2nd Weight Sex Delivery Anes PTL Lv  1 Gravida              ROS: A ROS was performed and pertinent positives and negatives are included in the history.  GENERAL: No fevers or chills. HEENT: No change in vision, no earache, sore throat or sinus congestion. NECK: No pain or stiffness. CARDIOVASCULAR: No chest pain or pressure. No palpitations. PULMONARY: No shortness of breath, cough or wheeze. GASTROINTESTINAL: No abdominal pain, nausea, vomiting or diarrhea, melena or bright red blood per rectum. GENITOURINARY: No urinary frequency, urgency, hesitancy or dysuria. MUSCULOSKELETAL: No joint or muscle pain, no back pain, no recent trauma. DERMATOLOGIC: No rash, no itching, no lesions. ENDOCRINE: No polyuria, polydipsia, no heat or cold intolerance. No recent change in weight. HEMATOLOGICAL: No anemia or easy bruising or bleeding. NEUROLOGIC: No headache, seizures, numbness, tingling or weakness. PSYCHIATRIC:  No depression, no loss of interest in normal activity or change in sleep pattern.     Exam:   Ht 5\' 1"  (1.549 m)   Wt 220 lb (99.8 kg)   LMP 01/08/2019   BMI 41.57 kg/m   Body mass index is 41.57 kg/m.  General appearance : Well developed well nourished female. No acute distress HEENT: Eyes: no retinal hemorrhage or exudates,  Neck supple, trachea midline, no carotid bruits, no thyroidmegaly Lungs: Clear to auscultation, no rhonchi or wheezes, or rib retractions  Heart: Regular rate and rhythm, no murmurs or gallops Breast:Examined in sitting and supine position were symmetrical in appearance, no palpable masses or tenderness,  no skin retraction, no nipple inversion, no nipple discharge, no skin discoloration, no axillary or supraclavicular lymphadenopathy Abdomen: no palpable masses or tenderness, no rebound or guarding Extremities: no edema or skin discoloration or tenderness  Pelvic: Vulva: Normal             Vagina: No gross lesions or discharge  Cervix: No gross lesions or discharge  Uterus  AV, normal size, shape and consistency, non-tender and mobile  Adnexa  Without masses or tenderness  Anus: Normal   Assessment/Plan:  48 y.o. female for annual exam   1. Well female exam with routine gynecological exam Normal gynecologic exam.  Pap test December 2019 was negative, will repeat Pap test at 2 to 3 years.  Breast exam normal.  Overdue for screening mammography, will schedule now.  Health labs with family  physician.  2. Use of condoms for contraception  3. Class 3 severe obesity due to excess calories with serious comorbidity and body mass index (BMI) of 40.0 to 44.9 in adult [facility]) Recommend a lower calorie/carb diet such as [employer].  Aerobic physical activities 5 times a week and light weightlifting every 2 days.  Princess Bruins MD, 2:16 PM 01/17/2019

## 2019-01-23 ENCOUNTER — Other Ambulatory Visit: Payer: Self-pay | Admitting: Physician Assistant

## 2019-01-23 DIAGNOSIS — G4733 Obstructive sleep apnea (adult) (pediatric): Secondary | ICD-10-CM | POA: Diagnosis not present

## 2019-01-23 DIAGNOSIS — M5441 Lumbago with sciatica, right side: Secondary | ICD-10-CM

## 2019-01-24 ENCOUNTER — Encounter: Payer: Self-pay | Admitting: Neurology

## 2019-01-31 ENCOUNTER — Ambulatory Visit
Admission: RE | Admit: 2019-01-31 | Discharge: 2019-01-31 | Disposition: A | Discharge status: Home / Self Care | Payer: BC Managed Care – PPO | Source: Ambulatory Visit | Attending: Orthopedic Surgery | Admitting: Orthopedic Surgery

## 2019-01-31 DIAGNOSIS — M542 Cervicalgia: Secondary | ICD-10-CM

## 2019-01-31 DIAGNOSIS — M4802 Spinal stenosis, cervical region: Secondary | ICD-10-CM | POA: Diagnosis not present

## 2019-02-11 ENCOUNTER — Other Ambulatory Visit: Payer: Self-pay | Admitting: Thoracic Surgery (Cardiothoracic Vascular Surgery)

## 2019-02-11 ENCOUNTER — Ambulatory Visit: Payer: BC Managed Care – PPO | Admitting: Physical Therapy

## 2019-02-11 DIAGNOSIS — Z9889 Other specified postprocedural states: Secondary | ICD-10-CM

## 2019-02-12 ENCOUNTER — Ambulatory Visit
Admission: RE | Admit: 2019-02-12 | Discharge: 2019-02-12 | Disposition: A | Discharge status: Home / Self Care | Payer: BC Managed Care – PPO | Source: Ambulatory Visit | Attending: Thoracic Surgery (Cardiothoracic Vascular Surgery) | Admitting: Thoracic Surgery (Cardiothoracic Vascular Surgery)

## 2019-02-12 ENCOUNTER — Encounter: Payer: Self-pay | Admitting: Thoracic Surgery (Cardiothoracic Vascular Surgery)

## 2019-02-12 ENCOUNTER — Other Ambulatory Visit: Payer: Self-pay

## 2019-02-12 ENCOUNTER — Ambulatory Visit: Payer: BC Managed Care – PPO | Admitting: Thoracic Surgery (Cardiothoracic Vascular Surgery)

## 2019-02-12 VITALS — BP 128/68 | HR 83 | Temp 97.9°F | Resp 16 | Ht 61.0 in | Wt 220.0 lb

## 2019-02-12 DIAGNOSIS — Z9889 Other specified postprocedural states: Secondary | ICD-10-CM | POA: Diagnosis not present

## 2019-02-12 DIAGNOSIS — K449 Diaphragmatic hernia without obstruction or gangrene: Secondary | ICD-10-CM

## 2019-02-12 DIAGNOSIS — J9811 Atelectasis: Secondary | ICD-10-CM | POA: Diagnosis not present

## 2019-02-12 NOTE — Progress Notes (Signed)
[address]       [place] [address]             [phone]     HPI: Samantha Greene returns for a scheduled follow-up visit  Samantha Greene is a 49 year old woman with a past history significant for hypertension, hyperlipidemia, obesity, anxiety, depression, and eventration of the right hemidiaphragm.  She had right thoracotomy for plication in February 2020.  I last saw her in the office in July.  She was doing well at that time and had improved significantly from prior to her surgery.  She is not feeling as well currently.  She continues to have some shortness of breath.  She has gained a significant amount of weight during the COVID-19 isolation.  She has been caring for her aunt which prevented her from participating in pulmonary rehab.  She is going to reresume pulmonary rehab next month.  She has some incisional pain and is on gabapentin for that.  She is taking that 300 mg twice daily rather than 3 times a day.  Past Medical History:  Diagnosis Date  . Anxiety   . Arthritis   . Depression    no meds  . Diaphragmatic hernia with obstruction, without gangrene 11/07/2018  . Dyspnea    can happen anytime  . Family history of breast cancer   . Family history of colon cancer   . Family history of lung cancer   . Family history of pancreatic cancer   . Family history of prostate cancer   . GERD (gastroesophageal reflux disease)   . Hernia, diaphragmatic   . History of kidney stones   . Hyperlipidemia   . Hypertension    no meds  . Pneumonia    1 time    Current Outpatient Medications  Medication Sig Dispense Refill  . albuterol (VENTOLIN HFA) 108 (90 Base) MCG/ACT inhaler Inhale 2 puffs into the lungs every 4 (four) hours as needed for wheezing or shortness of breath. 1 Inhaler 0  . aspirin 81 MG tablet Take 81 mg by mouth daily.    . baclofen (LIORESAL) 10 MG tablet Take 1/2 to 1 tablet 1 or 2 x /day as needed for Muscle Spasms 180 tablet 1  .  buPROPion (WELLBUTRIN XL) 300 MG 24 hr tablet Take 1 tablet every Morning for Mood, Focus & Concentration 90 tablet 3  . cetirizine (ZYRTEC) 10 MG tablet Take 10 mg by mouth at bedtime.    . Cholecalciferol (VITAMIN D) 125 MCG (5000 UT) CAPS Take 5,000 Units by mouth daily.     Marland Kitchen escitalopram (LEXAPRO) 20 MG tablet TAKE 1 TABLET BY MOUTH EVERY DAY 90 tablet 1  . Eszopiclone 3 MG TABS TAKE 1 TABLET BY MOUTH AT BEDTIME AS NEEDED. TAKE IMMEDIATELY BEFORE BEDTIME 30 tablet 3  . fluocinolone (SYNALAR) 0.025 % ointment APPLY TO AFFECTED AREA TWICE A DAY 30 g 0  . fluticasone (FLONASE) 50 MCG/ACT nasal spray Place 2 sprays into both nostrils daily. 16 g 2  . gabapentin (NEURONTIN) 300 MG capsule Take 1 capsule (300 mg total) by mouth 3 (three) times daily. (Patient taking differently: Take 300 mg by mouth 3 (three) times daily. Patient taking BID) 90 capsule 2  . meloxicam (MOBIC) 15 MG tablet Take 1/2 to 1 tablet Daily for Pain & Inflammation  (Limit to 4-5 tabs /week Tto Avoid Kidney Damage) 90 tablet 3  . montelukast (SINGULAIR) 10 MG tablet Take 1 tablet (10 mg  total) by mouth at bedtime. Take 1 tablet daily for Allergies 90 tablet 3  . nystatin cream (MYCOSTATIN) Apply 1 application topically 2 (two) times daily. (Patient taking differently: Apply 1 application topically 2 (two) times daily as needed for dry skin. ) 30 g 1  . omeprazole (PRILOSEC) 40 MG capsule TAKE 1 CAPSULE BY MOUTH EVERY DAY 90 capsule 3  . rosuvastatin (CRESTOR) 5 MG tablet Take 1 tablet (5 mg total) by mouth at bedtime. 90 tablet 3  . telmisartan (MICARDIS) 40 MG tablet Take 1 tablet Daily for BP 90 tablet 1  . triamcinolone ointment (KENALOG) 0.1 % Apply 1 application topically 2 (two) times daily. (Patient taking differently: Apply 1 application topically daily as needed (rash). ) 80 g 1  . vitamin C (ASCORBIC ACID) 500 MG tablet Take 500 mg by mouth daily.    . vitamin E 600 UNIT capsule Take 600 Units by mouth daily.     No  current facility-administered medications for this visit.    Physical Exam BP 128/68 (BP Location: Right Arm, Patient Position: Sitting, Cuff Size: Large)   Pulse 83   Temp 97.9 F (36.6 C)   Resp 16   Ht 5\' 1"  (1.549 m)   Wt 220 lb (99.8 kg)   SpO2 95% Comment: RA  BMI 41.109 kg/m  49 year old woman in no acute distress Alert and oriented x3 with no focal deficits Cardiac regular rate and rhythm Lungs diminished at right base Incision well-healed  Diagnostic Tests: CHEST - 2 VIEW  COMPARISON:  Chest x-ray 08/14/2018  FINDINGS: The cardiac silhouette, mediastinal and hilar contours are within normal limits and stable. Chronic elevation of the right hemidiaphragm with overlying vascular crowding and streaky atelectasis. No infiltrates or effusions. No worrisome pulmonary lesions. The bony thorax is intact.  IMPRESSION: 1. Stable elevation of the right hemidiaphragm with overlying vascular crowding and streaky atelectasis. 2. No acute pulmonary findings.   Electronically Signed   By: 08/16/2018 M.D.   On: 02/12/2019 09:51 I personally reviewed the chest x-ray and concur with the findings noted above.  Impression: Samantha Greene is a 49 year old woman who underwent a right thoracotomy and plication of the right hemidiaphragm in February 2020.  She had an asymmetric elevation of the anterior half of the diaphragm.  Initially she had pretty significant improvement in her respiratory status and was participating in pulmonary rehabilitation.  Unfortunately, social issues have prevented her from continuing with pulmonary rehab.  It sounds like those issues have improved and she is planning to resume pulmonary rehab next month.  I encouraged her to do so.  She also has had some weight gain during isolation due to the pandemic.  The importance of diet, regular exercise, and weight loss were emphasized.  She still has some neuropathic pain.  She is currently taking  gabapentin 300 mg twice daily.  Plan:  Resume pulmonary rehab when able Return in 6 months with PA and lateral chest x-ray  March 2020, MD [facility] [phone]

## 2019-02-13 ENCOUNTER — Ambulatory Visit: Payer: BC Managed Care – PPO | Admitting: Orthopedic Surgery

## 2019-02-13 ENCOUNTER — Encounter: Payer: Self-pay | Admitting: Orthopedic Surgery

## 2019-02-13 DIAGNOSIS — M47812 Spondylosis without myelopathy or radiculopathy, cervical region: Secondary | ICD-10-CM | POA: Diagnosis not present

## 2019-02-14 ENCOUNTER — Ambulatory Visit (INDEPENDENT_AMBULATORY_CARE_PROVIDER_SITE_OTHER): Payer: BC Managed Care – PPO | Admitting: Physical Therapy

## 2019-02-14 ENCOUNTER — Other Ambulatory Visit: Payer: Self-pay

## 2019-02-14 DIAGNOSIS — M542 Cervicalgia: Secondary | ICD-10-CM | POA: Diagnosis not present

## 2019-02-14 DIAGNOSIS — M6281 Muscle weakness (generalized): Secondary | ICD-10-CM | POA: Diagnosis not present

## 2019-02-14 NOTE — Patient Instructions (Signed)
Access Code: [email or web address]  URL: [email or web address]  Date: 02/14/2019  Prepared by: Ivery Quale   Exercises  Seated Cervical Retraction - 10 reps - 3 sets - 2x daily - 6x weekly  Seated Levator Scapulae Stretch - 2-3 reps - 30 hold - 2x daily - 6x weekly  Seated Cervical Sidebending Stretch - 2-3 reps - 1 sets - 30 sec hold - 2x daily - 6x weekly  Seated Thoracic Self Mobilization - 10 reps - 3 sets - 5" hold - 1x daily - 7x weekly  Scapular Retraction with Resistance - 10 reps - 3 sets - 2x daily - 6x weekly  Seated Assisted Cervical Rotation with Towel - 10 reps - 1 sets - 5 hold - 2x daily - 6x weekly  Cervical Extension AROM with Strap - 10 reps - 1-2 sets - 2x daily - 6x weekly  Seated Isometric Cervical Sidebending - 10 reps - 1 sets - 5 hold - 2x daily - 6x weekly  Standing Isometric Cervical Flexion with Manual Resistance - 10 reps - 1 sets - 5 hold - 2x daily - 6x weekly  Standing Isometric Cervical Extension with Manual Resistance - 10 reps - 1 sets - 5 hold - 2x daily - 6x weekly

## 2019-02-14 NOTE — Therapy (Signed)
[facility] [address] [place], [place], [address] Phone: [phone]   Fax:  [phone]  Physical Therapy Evaluation  Patient Details  Name: Samantha Greene MRN: [id number] Date of Birth: 1970-08-28 Referring Provider (PT): Quentin Mulling, [place]   Encounter Date: 02/14/2019  PT End of Session - 02/14/19 1351    Visit Number  1    Number of Visits  6    Date for PT Re-Evaluation  03/28/19    PT Start Time  0930    PT Stop Time  1015    PT Time Calculation (min)  45 min    Activity Tolerance  Patient tolerated treatment well    Behavior During Therapy  [facility] for tasks assessed/performed       Past Medical History:  Diagnosis Date  . Anxiety   . Arthritis   . Depression    no meds  . Diaphragmatic hernia with obstruction, without gangrene 11/07/2018  . Dyspnea    can happen anytime  . Family history of breast cancer   . Family history of colon cancer   . Family history of lung cancer   . Family history of pancreatic cancer   . Family history of prostate cancer   . GERD (gastroesophageal reflux disease)   . Hernia, diaphragmatic   . History of kidney stones   . Hyperlipidemia   . Hypertension    no meds  . Pneumonia    1 time    Past Surgical History:  Procedure Laterality Date  . DILATION AND CURETTAGE OF UTERUS     x3  . DILATION AND EVACUATION    . EPIGASTRIC HERNIA REPAIR N/A 03/23/2018   Procedure: PLICATION OF DIAPHRAGM;  Surgeon: Loreli Slot, MD;  Location: [facility] OR;  Service: Thoracic;  Laterality: N/A;  . IR THORACENTESIS ASP PLEURAL SPACE W/IMG GUIDE  04/19/2018  . VIDEO ASSISTED THORACOSCOPY (VATS)/THOROCOTOMY Right 03/23/2018   Procedure: VIDEO ASSISTED THORACOSCOPY;  Surgeon: Loreli Slot, MD;  Location: [facility] OR;  Service: Thoracic;  Laterality: Right;    There were no vitals filed for this visit.   Subjective Assessment - 02/14/19 1348    Subjective  Chronic intermittent neck pain for  years, worse episode starting in November 2020 with radiculopathy down her Rt side. She had MRI which showed Multilevel mild cervical spondylosis.  No acute findings. She then says she then work up about 2 weeks ago and the pain was better. She denies pain currently, denies radiculopathy, but feels she needs to work on neck strength and tightness and learn what do to do if pain returns.    Pertinent History  PMH: anxiety, depression, arthritis, diaphragmatic hernia with obstruction, reflux, HTN    Diagnostic tests  MRI which showed Multilevel mild cervical spondylosis.  No acute findings.    Patient Stated Goals  she needs to work on neck strength and tightness and learn what do to do if pain returns.    Currently in Pain?  No/denies         [facility] PT Assessment - 02/14/19 0001      Assessment   Medical Diagnosis  Chronin neck pain    Referring Provider (PT)  Quentin Mulling, PA-C    Onset Date/Surgical Date  --   Chronic intermittent neck pain for many years   Next MD Visit  PRN      Precautions   Precautions  None      Restrictions   Weight Bearing Restrictions  No  Balance Screen   Has the patient fallen in the past 6 months  No      Home Environment   Living Environment  Private residence      Prior Function   Level of Independence  Independent      Cognition   Overall Cognitive Status  Within Functional Limits for tasks assessed      Sensation   Light Touch  Appears Intact      Posture/Postural Control   Posture Comments  incresaed thoracic kyphosis      ROM / Strength   AROM / PROM / Strength  AROM;Strength      AROM   AROM Assessment Site  Cervical    Cervical Flexion  20    Cervical Extension  20    Cervical - Right Side Bend  20    Cervical - Left Side Bend  23    Cervical - Right Rotation  40    Cervical - Left Rotation  40      Strength   Overall Strength Comments  UE strength 5/5, neck strength grossly 4+/5       Flexibility   Soft Tissue  Assessment /Muscle Length  --   mod tight cervical P.S, upper traps with active T.P.     Palpation   Spinal mobility  no pain reported with spinal PAM testing but mod hyomobility at Lower cervical and upper thoracic segments      Special Tests   Other special tests  neg spurlings test      Transfers   Transfers  Independent with all Transfers                Objective measurements completed on examination: See above findings.      [facility] Adult PT Treatment/Exercise - 02/14/19 0001      Exercises   Exercises  Neck      Neck Exercises: Seated   Other Seated Exercise  neck retractions X 10 reps      Neck Exercises: Supine   Cervical Isometrics Limitations  1 rep to show understanding for lateral bending, flexion, and extension 5 sec hold      Modalities   Modalities  Moist Heat      Moist Heat Therapy   Number Minutes Moist Heat  10 Minutes    Moist Heat Location  Cervical      Neck Exercises: Stretches   Upper Trapezius Stretch  Right;Left;2 reps;30 seconds    Levator Stretch  Right;Left;2 reps;30 seconds             PT Education - 02/14/19 1351    Education Details  HEP, POC    Person(s) Educated  Patient    Methods  Explanation;Demonstration;Verbal cues;Handout    Comprehension  Verbalized understanding;Need further instruction          PT Long Term Goals - 02/14/19 1356      PT LONG TERM GOAL #1   Title  Pt will be I and compliant with HEP. (Target for all goals 6 weeks 03/28/19)    Status  New      PT LONG TERM GOAL #2   Title  Pt will improve neck ROM to [facility].    Status  New      PT LONG TERM GOAL #3   Title  Pt will improve neck strength to 5/5 to improve function.    Status  New      PT LONG TERM GOAL #4   Title  Pt will relay less than 2-3/10 with usual activity and sleeping.    Status  New             Plan - 02/14/19 1352    Clinical Impression Statement  Pt presents with chronic intermittent neck pain with cervical  spondylosis. Since recent flare up she relays her pain has resolved and nothing aggravates her currently however she does lack neck ROM, has trigger points in her upper traps, and has some overall neck weakness and postural dysfucntion. She will benefit from a few sessions of skilled PT to address these deficits and get her indpendent with HEP.    Personal Factors and Comorbidities  Time since onset of injury/illness/exacerbation    Stability/Clinical Decision Making  Stable/Uncomplicated    Clinical Decision Making  Low    Rehab Potential  Good    PT Frequency  1x / week    PT Duration  6 weeks    PT Treatment/Interventions  ADLs/Self Care Home Management;Cryotherapy;Electrical Stimulation;Iontophoresis 4mg /ml Dexamethasone;Moist Heat;Traction;Ultrasound;Therapeutic activities;Therapeutic exercise;Balance training;Neuromuscular re-education;Manual techniques;Passive range of motion;Taping;Spinal Manipulations;Joint Manipulations    PT Next Visit Plan  review and update HEP PRN, consider MT and T.P. release or DN    PT Home Exercise Plan  Access Code: [email or web address]    Consulted and Agree with Plan of Care  Patient       Patient will benefit from skilled therapeutic intervention in order to improve the following deficits and impairments:  Decreased activity tolerance, Decreased range of motion, Decreased strength, Hypomobility, Postural dysfunction, Impaired flexibility, Increased muscle spasms, Pain  Visit Diagnosis: Cervicalgia  Muscle weakness (generalized)     Problem List Patient Active Problem List   Diagnosis Date Noted  . OSA on CPAP 12/11/2018  . Chronic insomnia 11/07/2018  . Class 2 obesity with alveolar hypoventilation, serious comorbidity, and body mass index (BMI) of 38.0 to 38.9 in adult ([place]) 11/07/2018  . SOB (shortness of breath) on exertion 11/07/2018  . S/P Thoracotomy with Plication of Diaphragm 03/23/2018  . Genetic testing 03/19/2018  . Family history of breast  cancer   . Family history of colon cancer   . Family history of prostate cancer   . Family history of lung cancer   . Family history of pancreatic cancer   . Carpal tunnel syndrome on both sides 06/29/2017  . Morbid obesity ([place]) 06/28/2017  . Psoriasis, guttate 02/22/2017  . Vitamin D deficiency 10/24/2016  . Dyspnea 10/01/2014  . Depression   . Hyperlipidemia   . Hypertension   . Anxiety   . Insomnia 03/02/2011    Samantha Greene 02/14/2019, 1:58 PM  [facility] [address] [place], [place], [address] Phone: [phone]   Fax:  [phone]  Name: Samantha Greene MRN: [id number] Date of Birth: 13-Mar-1970

## 2019-02-15 ENCOUNTER — Encounter: Payer: Self-pay | Admitting: Orthopedic Surgery
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
## 2019-02-17 ENCOUNTER — Encounter: Payer: Self-pay | Admitting: Orthopedic Surgery

## 2019-02-22 ENCOUNTER — Encounter: Payer: BC Managed Care – PPO | Admitting: Physical Therapy

## 2019-02-23 DIAGNOSIS — G4733 Obstructive sleep apnea (adult) (pediatric): Secondary | ICD-10-CM | POA: Diagnosis not present

## 2019-03-02 ENCOUNTER — Other Ambulatory Visit: Payer: Self-pay | Admitting: Adult Health Nurse Practitioner

## 2019-03-02 DIAGNOSIS — F3341 Major depressive disorder, recurrent, in partial remission: Secondary | ICD-10-CM

## 2019-03-12 ENCOUNTER — Ambulatory Visit (INDEPENDENT_AMBULATORY_CARE_PROVIDER_SITE_OTHER): Payer: BC Managed Care – PPO | Admitting: Physical Therapy

## 2019-03-12 ENCOUNTER — Encounter: Payer: Self-pay | Admitting: Physical Therapy

## 2019-03-12 ENCOUNTER — Other Ambulatory Visit: Payer: Self-pay

## 2019-03-12 DIAGNOSIS — M542 Cervicalgia: Secondary | ICD-10-CM

## 2019-03-12 DIAGNOSIS — M6281 Muscle weakness (generalized): Secondary | ICD-10-CM | POA: Diagnosis not present

## 2019-03-12 NOTE — Therapy (Addendum)
[facility] [address] [place], [place], [address] Phone: [phone]   Fax:  [phone]  Physical Therapy Treatment  Patient Details  Name: Samantha Greene MRN: [id number] Date of Birth: 10/15/1970 Referring Provider (PT): Vicie Mutters, [place]   Encounter Date: 03/12/2019  PT End of Session - 03/12/19 1151    Visit Number  2    Number of Visits  6    Date for PT Re-Evaluation  03/28/19    PT Start Time  1025    PT Stop Time  1150    PT Time Calculation (min)  45 min       Past Medical History:  Diagnosis Date  . Anxiety   . Arthritis   . Depression    no meds  . Diaphragmatic hernia with obstruction, without gangrene 11/07/2018  . Dyspnea    can happen anytime  . Family history of breast cancer   . Family history of colon cancer   . Family history of lung cancer   . Family history of pancreatic cancer   . Family history of prostate cancer   . GERD (gastroesophageal reflux disease)   . Hernia, diaphragmatic   . History of kidney stones   . Hyperlipidemia   . Hypertension    no meds  . Pneumonia    1 time    Past Surgical History:  Procedure Laterality Date  . DILATION AND CURETTAGE OF UTERUS     x3  . DILATION AND EVACUATION    . EPIGASTRIC HERNIA REPAIR N/A 8/52/7782   Procedure: PLICATION OF DIAPHRAGM;  Surgeon: Melrose Nakayama, MD;  Location: [place];  Service: Thoracic;  Laterality: N/A;  . IR THORACENTESIS ASP PLEURAL SPACE W/IMG GUIDE  04/19/2018  . VIDEO ASSISTED THORACOSCOPY (VATS)/THOROCOTOMY Right 03/23/2018   Procedure: VIDEO ASSISTED THORACOSCOPY;  Surgeon: Melrose Nakayama, MD;  Location: [place];  Service: Thoracic;  Laterality: Right;    There were no vitals filed for this visit.  Subjective Assessment - 03/12/19 1113    Subjective  Pt arriving to therapy reporting 5/10 pain in her neck. Pt reporting waking up at night due to bilateral arm pain/numbness. Pt reporting 3 deaths  in her family  since her last visit    Pertinent History  PMH: anxiety, depression, arthritis, diaphragmatic hernia with obstruction, reflux, HTN    Diagnostic tests  MRI which showed Multilevel mild cervical spondylosis.  No acute findings.    Patient Stated Goals  she needs to work on neck strength and tightness and learn what do to do if pain returns.    Currently in Pain?  Yes    Pain Score  5     Pain Location  Neck    Pain Orientation  Right;Left    Pain Descriptors / Indicators  Aching;Sore    Pain Type  Chronic pain    Pain Onset  More than a month ago    Pain Frequency  Intermittent    Aggravating Factors   sleeping, lifting, prolonged sitting    Pain Relieving Factors  heat, massage                       [facility] Adult PT Treatment/Exercise - 03/12/19 0001      Exercises   Exercises  Neck      Neck Exercises: Seated   Other Seated Exercise  neck retractions X 10 reps      Neck Exercises: Supine   Cervical Isometrics  Flexion;Extension;Right lateral flexion;Left lateral flexion;Right rotation;Left rotation;5 secs;5 reps      Modalities   Modalities  Moist Heat      Moist Heat Therapy   Number Minutes Moist Heat  10 Minutes    Moist Heat Location  Cervical      Manual Therapy   Manual Therapy  Soft tissue mobilization    Manual therapy comments  25 minutes   Biofreeze   Soft tissue mobilization  bilateral upper traps, occipitals, medial scapular border, active trigger points noted, trigger point release performed             PT Education - 03/12/19 1115    Education Details  discussed getting a new pillow and positioning    Person(s) Educated  Patient    Methods  Explanation;Demonstration    Comprehension  Verbalized understanding;Returned demonstration          PT Long Term Goals - 03/12/19 1149      PT LONG TERM GOAL #1   Title  Pt will be I and compliant with HEP. (Target for all goals 6 weeks 03/28/19)    Status  New      PT LONG TERM GOAL #2    Title  Pt will improve neck ROM to [facility].    Status  New      PT LONG TERM GOAL #3   Title  Pt will improve neck strength to 5/5 to improve function.    Status  New      PT LONG TERM GOAL #4   Title  Pt will relay less than 2-3/10 with usual activity and sleeping.    Status  On-going            Plan - 03/12/19 1157    Clinical Impression Statement  Pt arriving to therapy reporting 5/10 neck pain. Pt reporting losing 3 family members since her last visit. Pt feels like she is carrying stress in her shoulders. Pt still reporting bilateral UE numbness at night that waker her up at night. STM and trigger point released performed today. Pt reporting relief at end of session. We discussed DN for future visit. Continue skilled PT.    Personal Factors and Comorbidities  Time since onset of injury/illness/exacerbation    Stability/Clinical Decision Making  Stable/Uncomplicated    Rehab Potential  Good    PT Frequency  1x / week    PT Duration  6 weeks    PT Treatment/Interventions  ADLs/Self Care Home Management;Cryotherapy;Electrical Stimulation;Iontophoresis 72m/ml Dexamethasone;Moist Heat;Traction;Ultrasound;Therapeutic activities;Therapeutic exercise;Balance training;Neuromuscular re-education;Manual techniques;Passive range of motion;Taping;Spinal Manipulations;Joint Manipulations    PT Next Visit Plan  review and update HEP PRN, consider MT and T.P. release or DN    PT Home Exercise Plan  Access Code: [email or web address]    Consulted and Agree with Plan of Care  Patient       Patient will benefit from skilled therapeutic intervention in order to improve the following deficits and impairments:  Decreased activity tolerance, Decreased range of motion, Decreased strength, Hypomobility, Postural dysfunction, Impaired flexibility, Increased muscle spasms, Pain  Visit Diagnosis: Cervicalgia  Muscle weakness (generalized)     Problem List Patient Active Problem List   Diagnosis Date Noted  .  OSA on CPAP 12/11/2018  . Chronic insomnia 11/07/2018  . Class 2 obesity with alveolar hypoventilation, serious comorbidity, and body mass index (BMI) of 38.0 to 38.9 in adult ([place] 11/07/2018  . SOB (shortness of breath) on exertion 11/07/2018  . S/P Thoracotomy with Plication of Diaphragm  03/23/2018  . Genetic testing 03/19/2018  . Family history of breast cancer   . Family history of colon cancer   . Family history of prostate cancer   . Family history of lung cancer   . Family history of pancreatic cancer   . Carpal tunnel syndrome on both sides 06/29/2017  . Morbid obesity ([place]) 06/28/2017  . Psoriasis, guttate 02/22/2017  . Vitamin D deficiency 10/24/2016  . Dyspnea 10/01/2014  . Depression   . Hyperlipidemia   . Hypertension   . Anxiety   . Insomnia 03/02/2011  PHYSICAL THERAPY DISCHARGE SUMMARY  Visits from Start of Care: 2  Current functional level related to goals / functional outcomes: see above   Remaining deficits: See above    Education / Equipment: HEP Plan: Patient agrees to discharge.  Patient goals were not met. Patient is being discharged due to not returning since the last visit.  ?????       Oretha Caprice, PT 03/12/2019, 12:04 PM  [facility] [address] [place], [place], [address] Phone: [phone]   Fax:  [phone]  Name: Samantha Greene MRN: [id number] Date of Birth: 09-Jul-1970

## 2019-03-13 ENCOUNTER — Other Ambulatory Visit: Payer: Self-pay | Admitting: Internal Medicine

## 2019-03-17 DIAGNOSIS — R7309 Other abnormal glucose: Secondary | ICD-10-CM | POA: Insufficient documentation

## 2019-03-17 NOTE — Progress Notes (Signed)
Complete Physical  Assessment and Plan:  Encounter for general adult medical examination with abnormal findings 1 year Get MGM  Essential hypertension - continue medications, DASH diet, exercise and monitor at home. Call if greater than 130/80.  -     CBC with Differential/Platelet -     COMPLETE METABOLIC PANEL WITH GFR -     TSH -     Urinalysis, Routine w reflex microscopic -     Microalbumin / creatinine urine ratio -     EKG 12-Lead  Mixed hyperlipidemia -     Lipid panel check lipids decrease fatty foods increase activity.   Morbid obesity (HCC) - follow up 3 months for progress monitoring - increase veggies, decrease carbs - long discussion about weight loss, diet, and exercise  Psoriasis, guttate Monitor  Vitamin D deficiency -     VITAMIN D 25 Hydroxy (Vit-D Deficiency, Fractures)  OSA on CPAP ? If working well- Patient still has fatigue- possible from medications, trying to cut back on those but with possible central sleep apnea, may want to try a Bipap or switch wellbutrin to nuvigil/adderall during the day- will message Dr. Golden Hurter.   Mild episode of recurrent major depressive disorder (HCC) Continue lexapro  Insomnia, unspecified type -     Eszopiclone 3 MG TABS; TAKE 1 TABLET BY MOUTH AT BEDTIME AS NEEDED. TAKE IMMEDIATELY BEFORE BEDTIME - follow up neuro  Medication management -     Magnesium  Abnormal glucose -     Hemoglobin A1c  Dyspnea on exertion -     AMB referral to pulmonary rehabilitation -     albuterol (VENTOLIN HFA) 108 (90 Base) MCG/ACT inhaler; Inhale 2 puffs into the lungs every 4 (four) hours as needed for wheezing or shortness of breath. - will refer back to pulmonary as well, go back to pulmonary rehab - patient was asking about disability due to SOB, I discussed that we do not do disability here and that we really need to try with weight loss, pulmonary rehab, pulmonary and the CPAP before she should pursue that route. She has  potentially very treatable things, will pursue treatment.   S/P Thoracotomy with Plication of Diaphragm -     AMB referral to pulmonary rehabilitation  Screening, anemia, deficiency, iron -     Iron,Total/Total Iron Binding Cap -     Vitamin B12   Discussed med's effects and SE's. Screening labs and tests as requested with regular follow-up as recommended. Future Appointments  Date Time Provider Department Center  06/17/2019 10:30 AM Quentin Mulling, PA-C [facility] None  07/09/2019  3:45 PM [name], NP [facility] None  08/13/2019  4:30 PM Loreli Slot, MD [facility]  01/20/2020  2:00 PM Genia Del, MD [facility]  03/17/2020 10:00 AM Quentin Mulling, PA-C [facility] None    HPI  49 y.o. female  presents for a complete physical.  BMI is Body mass index is 42.89 kg/m., she is working on diet and exercise. Wt Readings from Last 3 Encounters:  03/18/19 227 lb (103 kg)  02/12/19 220 lb (99.8 kg)  01/17/19 220 lb (99.8 kg)   She is s/p VATS 03/23/2018, was still having SOB with exertion or bening over and some right sided pain with bending over- she was doing better with pulmonary rehab but has been taking care of her aunt who is 23 but states they are doing better.   She has had a lot of deaths with her friends and family lately and  is tearful. She is on lexapro 20 mg and wellbutrin 300mg .   And had a sleep study that showed severe OSA with AHI 37.5/h and possible central sleep apnea characteristics.  and is now on a CPAP x Nov, uses [employer]. States she is using it nightly, having trouble sleeping lately due to anxiety/grief and still having fatigue through the day. She is trying to cut back her gabapentin to 1 a day.   No CP, no fever, chills, cough.  She is also asking about disability due to her SOB, also states since she was attacked by a dog, she has not been able to type the same, will get evaulated by ortho that she is seeing for her neck pain.    Her  blood pressure has been controlled at home, today their BP is BP: 128/84.  She does workout. She denies chest pain, shortness of breath, dizziness.   She is on cholesterol medication and denies myalgias. Her cholesterol is at goal. The cholesterol last visit was:  Lab Results  Component Value Date   CHOL 170 12/12/2018   HDL 63 12/12/2018   LDLCALC 78 12/12/2018   TRIG 194 (H) 12/12/2018   CHOLHDL 2.7 12/12/2018  . She has been working on diet and exercise for prediabetes, she is on bASA, she is on ACE/ARB and denies foot ulcerations, hyperglycemia, hypoglycemia , increased appetite, nausea, paresthesia of the feet, polydipsia, polyuria, visual disturbances, vomiting and weight loss. Last A1C in the office was:  Lab Results  Component Value Date   HGBA1C 5.4 12/12/2018   Patient is on Vitamin D supplement, she is on 5000 daily.   Lab Results  Component Value Date   VD25OH 57 12/12/2018     She has recurrent sinus injections, going to call Dr. Lucia Gaskins to try get nasal surgery- has been 3 years.   Current Medications:    Current Outpatient Medications (Cardiovascular):  .  rosuvastatin (CRESTOR) 5 MG tablet, Take 1 tablet (5 mg total) by mouth at bedtime. Marland Kitchen  telmisartan (MICARDIS) 40 MG tablet, TAKE 1 TABLET DAILY FOR BLOOD PRESSURE  Current Outpatient Medications (Respiratory):  .  albuterol (VENTOLIN HFA) 108 (90 Base) MCG/ACT inhaler, Inhale 2 puffs into the lungs every 4 (four) hours as needed for wheezing or shortness of breath. .  cetirizine (ZYRTEC) 10 MG tablet, Take 10 mg by mouth at bedtime. .  montelukast (SINGULAIR) 10 MG tablet, Take 1 tablet (10 mg total) by mouth at bedtime. Take 1 tablet daily for Allergies .  fluticasone (FLONASE) 50 MCG/ACT nasal spray, Place 2 sprays into both nostrils daily.  Current Outpatient Medications (Analgesics):  .  aspirin 81 MG tablet, Take 81 mg by mouth daily. .  meloxicam (MOBIC) 15 MG tablet, Take 1/2 to 1 tablet Daily for Pain &  Inflammation  (Limit to 4-5 tabs /week Tto Avoid Kidney Damage)   Current Outpatient Medications (Other):  .  baclofen (LIORESAL) 10 MG tablet, Take 1/2 to 1 tablet 1 or 2 x /day as needed for Muscle Spasms .  buPROPion (WELLBUTRIN XL) 300 MG 24 hr tablet, Take 1 tablet every Morning for Mood, Focus & Concentration .  Cholecalciferol (VITAMIN D) 125 MCG (5000 UT) CAPS, Take 5,000 Units by mouth daily.  Marland Kitchen  escitalopram (LEXAPRO) 20 MG tablet, Take 1 tablet Daily for Mood .  Eszopiclone 3 MG TABS, TAKE 1 TABLET BY MOUTH AT BEDTIME AS NEEDED. TAKE IMMEDIATELY BEFORE BEDTIME .  fluocinolone (SYNALAR) 0.025 % ointment, APPLY TO AFFECTED AREA  TWICE A DAY .  gabapentin (NEURONTIN) 300 MG capsule, Take 1 capsule (300 mg total) by mouth 3 (three) times daily. (Patient taking differently: Take 300 mg by mouth 3 (three) times daily. Patient taking BID) .  nystatin cream (MYCOSTATIN), Apply 1 application topically 2 (two) times daily. (Patient taking differently: Apply 1 application topically 2 (two) times daily as needed for dry skin. ) .  omeprazole (PRILOSEC) 40 MG capsule, TAKE 1 CAPSULE BY MOUTH EVERY DAY .  triamcinolone ointment (KENALOG) 0.1 %, Apply 1 application topically 2 (two) times daily. (Patient taking differently: Apply 1 application topically daily as needed (rash). ) .  vitamin C (ASCORBIC ACID) 500 MG tablet, Take 500 mg by mouth daily. .  vitamin E 600 UNIT capsule, Take 600 Units by mouth daily.  Health Maintenance:   Immunization History  Administered Date(s) Administered  . Influenza Inj Mdck Quad With Preservative 10/25/2016  . Influenza, Seasonal, Injecte, Preservative Fre 01/01/2015  . Influenza,inj,Quad PF,6+ Mos 10/25/2012  . PPD Test 04/11/2013  . Pneumococcal Polysaccharide-23 03/28/2012  . Tdap 12/12/2012   Tetanus: 2014 Pneumovax: 2014 Flu vaccine: 2020 at [facility]  Pap: Levar Dec 2019 MGM: 2017- will get  Last Dental Exam: Yearly visit PFTs unable to  complete  Patient Care Team: Lucky Cowboy, MD as PCP - General (Internal Medicine) Antionette Char, MD as Consulting Physician (Obstetrics and Gynecology) Cindee Salt, MD as Consulting Physician (Orthopedic Surgery)  Medical History:  Past Medical History:  Diagnosis Date  . Anxiety   . Arthritis   . Depression    no meds  . Diaphragmatic hernia with obstruction, without gangrene 11/07/2018  . Dyspnea    can happen anytime  . Family history of breast cancer   . Family history of colon cancer   . Family history of lung cancer   . Family history of pancreatic cancer   . Family history of prostate cancer   . GERD (gastroesophageal reflux disease)   . Hernia, diaphragmatic   . History of kidney stones   . Hyperlipidemia   . Hypertension    no meds  . Pneumonia    1 time   Allergies Allergies  Allergen Reactions  . Erythromycin Nausea Only and Other (See Comments)    Stomach cramping (can take Z Pak)  . Flexeril [Cyclobenzaprine Hcl] Other (See Comments)    "don't like the way I feel"  . Metformin And Related Other (See Comments)    GI Upset    SURGICAL HISTORY She  has a past surgical history that includes Dilation and evacuation; Dilation and curettage of uterus; Video assisted thoracoscopy (vats)/thorocotomy (Right, 03/23/2018); epigastric hernia repair (N/A, 03/23/2018); and IR THORACENTESIS ASP PLEURAL SPACE W/IMG GUIDE (04/19/2018).   FAMILY HISTORY Her family history includes Breast cancer in her cousin and maternal aunt; Breast cancer (age of onset: 28) in her mother; Cervical cancer in her paternal aunt; Colon cancer in her maternal aunt, maternal aunt, maternal uncle, and maternal uncle; Colon polyps in her maternal aunt and mother; Congestive Heart Failure in her mother; Diabetes in her brother, maternal aunt, maternal uncle, and mother; Heart disease in her maternal aunt, maternal grandmother, and maternal uncle; Hemochromatosis in her paternal uncle;  Hyperlipidemia in her brother, maternal aunt, maternal uncle, and mother; Hypertension in her brother, maternal aunt, maternal uncle, and mother; Kidney disease in her mother; Lung cancer in her mother; Ovarian cancer in her paternal aunt; Pancreatic cancer in her maternal aunt; Polycystic ovary syndrome in her paternal aunt; Prostate  cancer in her cousin, cousin, and maternal uncle; Stroke in her maternal aunt and maternal uncle; Throat cancer in her maternal aunt.   SOCIAL HISTORY She  reports that she has never smoked. She has never used smokeless tobacco. She reports current alcohol use. She reports that she does not use drugs.  Review of Systems: Review of Systems  Constitutional: Negative for chills, fever and malaise/fatigue.  HENT: Negative for congestion, ear pain and sore throat.   Eyes: Negative.   Respiratory: Negative for cough, shortness of breath and wheezing.   Cardiovascular: Negative for chest pain, palpitations and leg swelling.  Gastrointestinal: Negative for abdominal pain, blood in stool, constipation, diarrhea, heartburn and melena.  Genitourinary: Negative for dysuria, frequency, hematuria and urgency.  Neurological: Negative for dizziness, loss of consciousness and headaches.  Psychiatric/Behavioral: Negative for depression. The patient is not nervous/anxious and does not have insomnia.     Physical Exam: Estimated body mass index is 42.89 kg/m as calculated from the following:   Height as of this encounter: 5\' 1"  (1.549 m).   Weight as of this encounter: 227 lb (103 kg). BP 128/84   Pulse 76   Temp (!) 97.2 F (36.2 C)   Ht 5\' 1"  (1.549 m)   Wt 227 lb (103 kg)   SpO2 96%   BMI 42.89 kg/m   General Appearance: Well nourished well developed, in no apparent distress.  Eyes: PERRLA, EOMs, conjunctiva no swelling or erythema ENT/Mouth: Ear canals normal without obstruction, swelling, erythema, or discharge.  TMs normal bilaterally with no erythema, bulging,  retraction, or loss of landmark.  Oropharynx moist and clear with no exudate, erythema, or swelling.  Neck: Supple, thyroid normal. No bruits.  No cervical adenopathy Respiratory: Respiratory effort normal, Breath sounds clear A&P without wheeze, rhonchi, rales.   Cardio: RRR without murmurs, rubs or gallops. Brisk peripheral pulses without edema.  Chest: symmetric, with normal excursions Breasts: Symmetric, without lumps, nipple discharge, retractions.  Abdomen: Soft, nontender, no guarding, rebound, hernias, masses, or organomegaly.  Lymphatics: Non tender without lymphadenopathy.  Musculoskeletal: Full ROM all peripheral extremities,5/5 strength, and normal gait.  Skin: Warm, dry without rashes, lesions, ecchymosis. Neuro: Awake and oriented X 3, Cranial nerves intact, reflexes equal bilaterally. Normal muscle tone, no cerebellar symptoms.  Psych:  normal affect, Insight and Judgment appropriate.   EKG: WNL no changes.  Over 40 minutes of exam, counseling, chart review and critical decision making was performed  12:33 PM [facility]

## 2019-03-18 ENCOUNTER — Encounter: Payer: Self-pay | Admitting: Physician Assistant

## 2019-03-18 ENCOUNTER — Other Ambulatory Visit: Payer: Self-pay

## 2019-03-18 ENCOUNTER — Ambulatory Visit: Payer: BC Managed Care – PPO | Admitting: Physician Assistant

## 2019-03-18 VITALS — BP 128/84 | HR 76 | Temp 97.2°F | Ht 61.0 in | Wt 227.0 lb

## 2019-03-18 DIAGNOSIS — Z1329 Encounter for screening for other suspected endocrine disorder: Secondary | ICD-10-CM | POA: Diagnosis not present

## 2019-03-18 DIAGNOSIS — E782 Mixed hyperlipidemia: Secondary | ICD-10-CM

## 2019-03-18 DIAGNOSIS — R0609 Other forms of dyspnea: Secondary | ICD-10-CM

## 2019-03-18 DIAGNOSIS — Z0000 Encounter for general adult medical examination without abnormal findings: Secondary | ICD-10-CM

## 2019-03-18 DIAGNOSIS — Z1389 Encounter for screening for other disorder: Secondary | ICD-10-CM | POA: Diagnosis not present

## 2019-03-18 DIAGNOSIS — E559 Vitamin D deficiency, unspecified: Secondary | ICD-10-CM | POA: Diagnosis not present

## 2019-03-18 DIAGNOSIS — Z131 Encounter for screening for diabetes mellitus: Secondary | ICD-10-CM

## 2019-03-18 DIAGNOSIS — I10 Essential (primary) hypertension: Secondary | ICD-10-CM

## 2019-03-18 DIAGNOSIS — Z13220 Encounter for screening for lipoid disorders: Secondary | ICD-10-CM

## 2019-03-18 DIAGNOSIS — Z9889 Other specified postprocedural states: Secondary | ICD-10-CM

## 2019-03-18 DIAGNOSIS — F330 Major depressive disorder, recurrent, mild: Secondary | ICD-10-CM

## 2019-03-18 DIAGNOSIS — G4733 Obstructive sleep apnea (adult) (pediatric): Secondary | ICD-10-CM

## 2019-03-18 DIAGNOSIS — L404 Guttate psoriasis: Secondary | ICD-10-CM

## 2019-03-18 DIAGNOSIS — Z130 Encounter for screening for diseases of the blood and blood-forming organs and certain disorders involving the immune mechanism: Secondary | ICD-10-CM

## 2019-03-18 DIAGNOSIS — Z79899 Other long term (current) drug therapy: Secondary | ICD-10-CM | POA: Diagnosis not present

## 2019-03-18 DIAGNOSIS — G4700 Insomnia, unspecified: Secondary | ICD-10-CM

## 2019-03-18 DIAGNOSIS — Z9989 Dependence on other enabling machines and devices: Secondary | ICD-10-CM

## 2019-03-18 DIAGNOSIS — R7309 Other abnormal glucose: Secondary | ICD-10-CM

## 2019-03-18 DIAGNOSIS — J069 Acute upper respiratory infection, unspecified: Secondary | ICD-10-CM

## 2019-03-18 DIAGNOSIS — Z0001 Encounter for general adult medical examination with abnormal findings: Secondary | ICD-10-CM

## 2019-03-18 MED ORDER — ALBUTEROL SULFATE HFA 108 (90 BASE) MCG/ACT IN AERS: 2.0000 | INHALATION_SPRAY | RESPIRATORY_TRACT | 2 refills | Status: DC | PRN

## 2019-03-18 MED ORDER — ESZOPICLONE 3 MG PO TABS: ORAL_TABLET | ORAL | 3 refills | Status: DC

## 2019-03-18 NOTE — Patient Instructions (Addendum)
Try the melatonin 5mg -20mg  dissolvable or gummy 30 mins before bed  HOW TO SCHEDULE A MAMMOGRAM  [facility]  7 a.m.-6:30 p.m., Monday 7 a.m.-5 p.m., Tuesday-Friday Schedule an appointment by calling [phone].  [facility] Schedule an appointment by calling [phone].  Please follow up with pulmonary rehab and follow up with pulmonary  Will contact neurology but you also please do that, we may adjust meds or you may benefit from BiPAP.    If I told you I had a single pill that would help you with everything listed below and more, would you be interested? . decrease stress by improving anxiety and depression . help you achieve a healthy weight . give you more energy . make you more productive . help you focus . decrease your risk of dementia/heart attack/stroke/falls . improve your bone health  These are just some of the benefits that exercise brings to you.   IT IS WORTH carving out some time every day to fit in exercise. It will help in every aspect of your health. Even if you have injuries that prevent you from participating in a type of exercise you used to do; there is always something that you can do to keep exercise a part of your life. If improving your health is important, make exercise your priority. It is worth the time! If you have questions about the type of exercise that is right for you, please talk with me about this!  EXERCISE IS MEDICINE!  Benefits of Exercise  Reduces breast cancer onset and recurrence by 50% Lowers risk of colon cancer by 66% Reduces the risk of Alzheimer's by almost 50% Reduces heart disease and high blood pressure by almost 50% Lowers risk of stroke by 33% Lowers risk of Type II Diabetes Mellitus by over 60% Treats depression as well as medication or cognitive behavioral therapy       Exercising to Stay Healthy  Exercising regularly is important. It has many health benefits, such  as:  Improving your overall fitness, flexibility, and endurance.  Increasing your bone density.  Helping with weight control.  Decreasing your body fat.  Increasing your muscle strength.  Reducing stress and tension.  Improving your overall health.   In order to become healthy and stay healthy, it is recommended that you do moderate-intensity and vigorous-intensity exercise. You can tell that you are exercising at a moderate intensity if you have a higher heart rate and faster breathing, but you are still able to hold a conversation. You can tell that you are exercising at a vigorous intensity if you are breathing much harder and faster and cannot hold a conversation while exercising. How often should I exercise? Choose an activity that you enjoy and set realistic goals. Your health care provider can help you to make an activity plan that works for you. Exercise regularly as directed by your health care provider. This may include:  Doing resistance training twice each week, such as: ? Push-ups. ? Sit-ups. ? Lifting weights. ? Using resistance bands.  Doing a given intensity of exercise for a given amount of time. Choose from these options: ? 150 minutes of moderate-intensity exercise every week. ? 75 minutes of vigorous-intensity exercise every week. ? A mix of moderate-intensity and vigorous-intensity exercise every week.   Children, pregnant women, people who are out of shape, people who are overweight, and older adults may need to consult a health care provider for individual recommendations. If you have any sort  of medical condition, be sure to consult your health care provider before starting a new exercise program. What are some exercise ideas? Some moderate-intensity exercise ideas include:  Walking at a rate of 1 mile in 15 minutes.  Biking.  Hiking.  Golfing.  Dancing.   Some vigorous-intensity exercise ideas include:  Walking at a rate of at least 4.5 miles  per hour.  Jogging or running at a rate of 5 miles per hour.  Biking at a rate of at least 10 miles per hour.  Lap swimming.  Roller-skating or in-line skating.  Cross-country skiing.  Vigorous competitive sports, such as football, basketball, and soccer.  Jumping rope.  Aerobic dancing.   What are some everyday activities that can help me to get exercise?  Yard work, such as: ? Pushing a Surveyor, mining. ? Raking and bagging leaves.  Washing and waxing your car.  Pushing a stroller.  Shoveling snow.  Gardening.  Washing windows or floors. How can I be more active in my day-to-day activities?  Use the stairs instead of the elevator.  Take a walk during your lunch break.  If you drive, park your car farther away from work or school.  If you take public transportation, get off one stop early and walk the rest of the way.  Make all of your phone calls while standing up and walking around.  Get up, stretch, and walk around every 30 minutes throughout the day. What guidelines should I follow while exercising?  Do not exercise so much that you hurt yourself, feel dizzy, or get very short of breath.  Consult your health care provider before starting a new exercise program.  Wear comfortable clothes and shoes with good support.  Drink plenty of water while you exercise to prevent dehydration or heat stroke. Body water is lost during exercise and must be replaced.  Work out until you breathe faster and your heart beats faster. This information is not intended to replace advice given to you by your health care provider. Make sure you discuss any questions you have with your health care provider.   Dysphagia- if the swallowing does not continue to get better or it starts to get worse we can refer to GI doctor for EGD Continue prilosec, continue to take mobic sparingly  Dysphagia is trouble swallowing. This condition occurs when solids and liquids stick in a person's  throat on the way down to the stomach, or when food takes longer to get to the stomach than usual. You may have problems swallowing food, liquids, or both. You may also have pain while trying to swallow. It may take you more time and effort to swallow something. What are the causes? This condition may be caused by:  Muscle problems. They may make it difficult for you to move food and liquids through the esophagus, which is the tube that connects your mouth to your stomach.  Blockages. You may have ulcers, scar tissue, or inflammation that blocks the normal passage of food and liquids. Causes of these problems include: ? Acid reflux from your stomach into your esophagus (gastroesophageal reflux). ? Infections. ? Radiation treatment for cancer. ? Medicines taken without enough fluids to wash them down into your stomach.  Stroke. This can affect the nerves and make it difficult to swallow.  Nerve problems. These prevent signals from being sent to the muscles of your esophagus to squeeze (contract) and move what you swallow down to your stomach.  Globus pharyngeus. This is a common  problem that involves a feeling like something is stuck in your throat or a sense of trouble with swallowing, even though nothing is wrong with the swallowing passages.  Certain conditions, such as cerebral palsy or Parkinson's disease. What are the signs or symptoms? Common symptoms of this condition include:  A feeling that solids or liquids are stuck in your throat on the way down to the stomach.  Pain while swallowing.  Coughing or gagging while trying to swallow. Other symptoms include:  Food moving back from your stomach to your mouth (regurgitation).  Noises coming from your throat.  Chest discomfort with swallowing.  A feeling of fullness when swallowing.  Drooling, especially when the throat is blocked.  Heartburn. How is this diagnosed? This condition may be diagnosed by:  Barium X-ray. In  this test, you will swallow a white liquid that sticks to the inside of your esophagus. X-ray images are then taken.  Endoscopy. In this test, a flexible telescope is inserted down your throat to look at your esophagus and your stomach.  CT scans and an MRI. How is this treated? Treatment for dysphagia depends on the cause of this condition, such as:  If the dysphagia is caused by acid reflux or infection, medicines may be used. They may include antibiotics and heartburn medicines.  If the dysphagia is caused by problems with the muscles, swallowing therapy may be used to help you strengthen your swallowing muscles. You may have to do specific exercises to strengthen the muscles or stretch them.  If the dysphagia is caused by a blockage or mass, procedures to remove the blockage may be done. You may need surgery and a feeding tube. You may need to make diet changes. Ask your health care provider for specific instructions. Follow these instructions at home: Medicines  Take over-the-counter and prescription medicines only as told by your health care provider.  If you were prescribed an antibiotic medicine, take it as told by your health care provider. Do not stop taking the antibiotic even if you start to feel better. Eating and drinking   Follow any diet changes as told by your health care provider.  Work with a diet and nutrition specialist (dietitian) to create an eating plan that will help you get the nutrients you need in order to stay healthy.  Eat soft foods that are easier to swallow.  Cut your food into small pieces and eat slowly. Take small bites.  Eat and drink only when you are sitting upright.  Do not drink alcohol or caffeine. If you need help quitting, ask your health care provider. General instructions  Check your weight every day to make sure you are not losing weight.  Do not use any products that contain nicotine or tobacco, such as cigarettes, e-cigarettes,  and chewing tobacco. If you need help quitting, ask your health care provider.  Keep all follow-up visits as told by your health care provider. This is important. Contact a health care provider if you:  Lose weight because you cannot swallow.  Cough when you drink liquids.  Cough up partially digested food. Get help right away if you:  Cannot swallow your saliva.  Have shortness of breath, a fever, or both.  Have a hoarse voice and also have trouble swallowing. Summary  Dysphagia is trouble swallowing. This condition occurs when solids and liquids stick in a person's throat on the way down to the stomach. You may cough or gag while trying to swallow.  Dysphagia has many  possible causes.  Treatment for dysphagia depends on the cause of the condition.  Keep all follow-up visits as told by your health care provider. This is important. This information is not intended to replace advice given to you by your health care provider. Make sure you discuss any questions you have with your health care provider. Document Revised: 06/13/2018 Document Reviewed: 06/13/2018 Elsevier Patient Education  2020 [employer].

## 2019-03-19 ENCOUNTER — Other Ambulatory Visit: Payer: Self-pay | Admitting: *Deleted

## 2019-03-19 DIAGNOSIS — R0609 Other forms of dyspnea: Secondary | ICD-10-CM

## 2019-03-19 LAB — COMPLETE METABOLIC PANEL WITHOUT GFR
AG Ratio: 1.6 (calc) (ref 1.0–2.5)
ALT: 20 U/L (ref 6–29)
AST: 18 U/L (ref 10–35)
Albumin: 4.4 g/dL (ref 3.6–5.1)
Alkaline phosphatase (APISO): 68 U/L (ref 31–125)
BUN/Creatinine Ratio: 13 (calc) (ref 6–22)
BUN: 14 mg/dL (ref 7–25)
CO2: 24 mmol/L (ref 20–32)
Calcium: 9.5 mg/dL (ref 8.6–10.2)
Chloride: 103 mmol/L (ref 98–110)
Creat: 1.12 mg/dL — ABNORMAL HIGH (ref 0.50–1.10)
GFR, Est African American: 67 mL/min/1.73m2
GFR, Est Non African American: 58 mL/min/1.73m2 — ABNORMAL LOW
Globulin: 2.7 g/dL (ref 1.9–3.7)
Glucose, Bld: 85 mg/dL (ref 65–99)
Potassium: 4.4 mmol/L (ref 3.5–5.3)
Sodium: 137 mmol/L (ref 135–146)
Total Bilirubin: 0.6 mg/dL (ref 0.2–1.2)
Total Protein: 7.1 g/dL (ref 6.1–8.1)

## 2019-03-19 LAB — IRON, TOTAL/TOTAL IRON BINDING CAP
%SAT: 31 % (calc) (ref 16–45)
Iron: 133 ug/dL (ref 40–190)
TIBC: 429 mcg/dL (calc) (ref 250–450)

## 2019-03-19 LAB — URINALYSIS, ROUTINE W REFLEX MICROSCOPIC
Bilirubin Urine: NEGATIVE
Glucose, UA: NEGATIVE
Hgb urine dipstick: NEGATIVE
Ketones, ur: NEGATIVE
Leukocytes,Ua: NEGATIVE
Nitrite: NEGATIVE
Protein, ur: NEGATIVE
Specific Gravity, Urine: 1.008 (ref 1.001–1.03)
pH: 6 (ref 5.0–8.0)

## 2019-03-19 LAB — HEMOGLOBIN A1C
Hgb A1c MFr Bld: 5.6 % of total Hgb (ref <5.7)
Mean Plasma Glucose: 114 (calc)
eAG (mmol/L): 6.3 (calc)

## 2019-03-19 LAB — CBC WITH DIFFERENTIAL/PLATELET
Absolute Monocytes: 490 cells/uL (ref 200–950)
Basophils Absolute: 28 cells/uL (ref 0–200)
Basophils Relative: 0.5 %
Eosinophils Absolute: 88 cells/uL (ref 15–500)
Eosinophils Relative: 1.6 %
HCT: 40 % (ref 35.0–45.0)
Hemoglobin: 13.5 g/dL (ref 11.7–15.5)
Lymphs Abs: 1782 cells/uL (ref 850–3900)
MCH: 32.2 pg (ref 27.0–33.0)
MCHC: 33.8 g/dL (ref 32.0–36.0)
MCV: 95.5 fL (ref 80.0–100.0)
MPV: 10 fL (ref 7.5–12.5)
Monocytes Relative: 8.9 %
Neutro Abs: 3113 cells/uL (ref 1500–7800)
Neutrophils Relative %: 56.6 %
Platelets: 287 10*3/uL (ref 140–400)
RBC: 4.19 10*6/uL (ref 3.80–5.10)
RDW: 12.4 % (ref 11.0–15.0)
Total Lymphocyte: 32.4 %
WBC: 5.5 10*3/uL (ref 3.8–10.8)

## 2019-03-19 LAB — MICROALBUMIN / CREATININE URINE RATIO
Creatinine, Urine: 53 mg/dL (ref 20–275)
Microalb Creat Ratio: 11 mcg/mg creat (ref <30)
Microalb, Ur: 0.6 mg/dL

## 2019-03-19 LAB — LIPID PANEL
Cholesterol: 174 mg/dL
HDL: 69 mg/dL
LDL Cholesterol (Calc): 82 mg/dL
Non-HDL Cholesterol (Calc): 105 mg/dL
Total CHOL/HDL Ratio: 2.5 (calc)
Triglycerides: 136 mg/dL

## 2019-03-19 LAB — MAGNESIUM: Magnesium: 2.2 mg/dL (ref 1.5–2.5)

## 2019-03-19 LAB — VITAMIN D 25 HYDROXY (VIT D DEFICIENCY, FRACTURES): Vit D, 25-Hydroxy: 42 ng/mL (ref 30–100)

## 2019-03-19 LAB — TSH: TSH: 1.96 mIU/L

## 2019-03-19 LAB — VITAMIN B12: Vitamin B-12: 392 pg/mL (ref 200–1100)

## 2019-03-26 DIAGNOSIS — G4733 Obstructive sleep apnea (adult) (pediatric): Secondary | ICD-10-CM | POA: Diagnosis not present

## 2019-04-03 ENCOUNTER — Other Ambulatory Visit: Payer: Self-pay

## 2019-04-03 ENCOUNTER — Encounter: Payer: Self-pay | Admitting: *Deleted

## 2019-04-03 ENCOUNTER — Encounter: Payer: BC Managed Care – PPO | Attending: Internal Medicine | Admitting: *Deleted

## 2019-04-03 DIAGNOSIS — I10 Essential (primary) hypertension: Secondary | ICD-10-CM | POA: Insufficient documentation

## 2019-04-03 DIAGNOSIS — R0600 Dyspnea, unspecified: Secondary | ICD-10-CM | POA: Insufficient documentation

## 2019-04-03 DIAGNOSIS — F329 Major depressive disorder, single episode, unspecified: Secondary | ICD-10-CM | POA: Insufficient documentation

## 2019-04-03 DIAGNOSIS — Z7982 Long term (current) use of aspirin: Secondary | ICD-10-CM | POA: Insufficient documentation

## 2019-04-03 DIAGNOSIS — E785 Hyperlipidemia, unspecified: Secondary | ICD-10-CM | POA: Insufficient documentation

## 2019-04-03 DIAGNOSIS — F419 Anxiety disorder, unspecified: Secondary | ICD-10-CM | POA: Insufficient documentation

## 2019-04-03 DIAGNOSIS — R0609 Other forms of dyspnea: Secondary | ICD-10-CM

## 2019-04-03 DIAGNOSIS — Z79899 Other long term (current) drug therapy: Secondary | ICD-10-CM | POA: Insufficient documentation

## 2019-04-03 NOTE — Progress Notes (Signed)
Completed virtual orientation today.  EP evaluation is scheduled for Tuesday 3/9 at 2pm.  Documentation for diagnosis can be found in [facility] encounter 03/18/19.  She will restart on session number 8.

## 2019-04-09 ENCOUNTER — Other Ambulatory Visit: Payer: Self-pay

## 2019-04-09 DIAGNOSIS — F329 Major depressive disorder, single episode, unspecified: Secondary | ICD-10-CM | POA: Diagnosis not present

## 2019-04-09 DIAGNOSIS — E785 Hyperlipidemia, unspecified: Secondary | ICD-10-CM | POA: Diagnosis not present

## 2019-04-09 DIAGNOSIS — I10 Essential (primary) hypertension: Secondary | ICD-10-CM | POA: Diagnosis not present

## 2019-04-09 DIAGNOSIS — R0600 Dyspnea, unspecified: Secondary | ICD-10-CM | POA: Diagnosis not present

## 2019-04-09 DIAGNOSIS — R0609 Other forms of dyspnea: Secondary | ICD-10-CM

## 2019-04-09 DIAGNOSIS — Z7982 Long term (current) use of aspirin: Secondary | ICD-10-CM | POA: Diagnosis not present

## 2019-04-09 DIAGNOSIS — Z79899 Other long term (current) drug therapy: Secondary | ICD-10-CM | POA: Diagnosis not present

## 2019-04-09 DIAGNOSIS — F419 Anxiety disorder, unspecified: Secondary | ICD-10-CM | POA: Diagnosis not present

## 2019-04-09 NOTE — Progress Notes (Signed)
Pulmonary Individual Treatment Plan  Patient Details  Name: Samantha Greene MRN: [id number] Date of Birth: January 04, 1971 Referring Provider:     Pulmonary Rehab from 10/11/2018 in [facility] Cardiac and Pulmonary Rehab  Referring Provider  [place]      Initial Encounter Date:    Pulmonary Rehab from 10/11/2018 in [facility] Cardiac and Pulmonary Rehab  Date  10/11/18      Visit Diagnosis: DOE (dyspnea on exertion)  Patient's Home Medications on Admission:  Current Outpatient Medications:  .  albuterol (VENTOLIN HFA) 108 (90 Base) MCG/ACT inhaler, Inhale 2 puffs into the lungs every 4 (four) hours as needed for wheezing or shortness of breath., Disp: 18 g, Rfl: 2 .  aspirin 81 MG tablet, Take 81 mg by mouth daily., Disp: , Rfl:  .  baclofen (LIORESAL) 10 MG tablet, Take 1/2 to 1 tablet 1 or 2 x /day as needed for Muscle Spasms, Disp: 180 tablet, Rfl: 1 .  buPROPion (WELLBUTRIN XL) 300 MG 24 hr tablet, Take 1 tablet every Morning for Mood, Focus & Concentration, Disp: 90 tablet, Rfl: 3 .  cetirizine (ZYRTEC) 10 MG tablet, Take 10 mg by mouth at bedtime., Disp: , Rfl:  .  Cholecalciferol (VITAMIN D) 125 MCG (5000 UT) CAPS, Take 5,000 Units by mouth daily. , Disp: , Rfl:  .  escitalopram (LEXAPRO) 20 MG tablet, Take 1 tablet Daily for Mood, Disp: 90 tablet, Rfl: 3 .  Eszopiclone 3 MG TABS, TAKE 1 TABLET BY MOUTH AT BEDTIME AS NEEDED. TAKE IMMEDIATELY BEFORE BEDTIME, Disp: 30 tablet, Rfl: 3 .  fluocinolone (SYNALAR) 0.025 % ointment, APPLY TO AFFECTED AREA TWICE A DAY, Disp: 30 g, Rfl: 0 .  fluticasone (FLONASE) 50 MCG/ACT nasal spray, Place 2 sprays into both nostrils daily., Disp: 16 g, Rfl: 2 .  gabapentin (NEURONTIN) 300 MG capsule, Take 1 capsule (300 mg total) by mouth 3 (three) times daily. (Patient taking differently: Take 300 mg by mouth 3 (three) times daily. Patient taking BID), Disp: 90 capsule, Rfl: 2 .  meloxicam (MOBIC) 15 MG tablet, Take 1/2 to 1 tablet Daily for Pain & Inflammation   (Limit to 4-5 tabs /week Tto Avoid Kidney Damage), Disp: 90 tablet, Rfl: 3 .  montelukast (SINGULAIR) 10 MG tablet, Take 1 tablet (10 mg total) by mouth at bedtime. Take 1 tablet daily for Allergies, Disp: 90 tablet, Rfl: 3 .  nystatin cream (MYCOSTATIN), Apply 1 application topically 2 (two) times daily. (Patient taking differently: Apply 1 application topically 2 (two) times daily as needed for dry skin. ), Disp: 30 g, Rfl: 1 .  omeprazole (PRILOSEC) 40 MG capsule, TAKE 1 CAPSULE BY MOUTH EVERY DAY, Disp: 90 capsule, Rfl: 3 .  rosuvastatin (CRESTOR) 5 MG tablet, Take 1 tablet (5 mg total) by mouth at bedtime., Disp: 90 tablet, Rfl: 3 .  telmisartan (MICARDIS) 40 MG tablet, TAKE 1 TABLET DAILY FOR BLOOD PRESSURE, Disp: 90 tablet, Rfl: 3 .  triamcinolone ointment (KENALOG) 0.1 %, Apply 1 application topically 2 (two) times daily. (Patient taking differently: Apply 1 application topically daily as needed (rash). ), Disp: 80 g, Rfl: 1 .  vitamin C (ASCORBIC ACID) 500 MG tablet, Take 500 mg by mouth daily., Disp: , Rfl:  .  vitamin E 600 UNIT capsule, Take 600 Units by mouth daily., Disp: , Rfl:   Past Medical History: Past Medical History:  Diagnosis Date  . Anxiety   . Arthritis   . Depression    no meds  . Diaphragmatic hernia  with obstruction, without gangrene 11/07/2018  . Dyspnea    can happen anytime  . Family history of breast cancer   . Family history of colon cancer   . Family history of lung cancer   . Family history of pancreatic cancer   . Family history of prostate cancer   . GERD (gastroesophageal reflux disease)   . Hernia, diaphragmatic   . History of kidney stones   . Hyperlipidemia   . Hypertension    no meds  . Pneumonia    1 time    Tobacco Use: Social History   Tobacco Use  Smoking Status Never Smoker  Smokeless Tobacco Never Used    Labs: Recent Review Flowsheet Data    Labs for ITP Cardiac and Pulmonary Rehab Latest Ref Rng & Units 03/21/2018 03/24/2018  09/11/2018 12/12/2018 03/18/2019   Cholestrol <200 mg/dL - - 226(H) 170 174   LDLCALC mg/dL (calc) - - 132(H) 78 82   HDL > OR = 50 mg/dL - - 62 63 69   Trlycerides <150 mg/dL - - 188(H) 194(H) 136   Hemoglobin A1c <5.7 % of total Hgb - - 5.6 5.4 5.6   PHART 7.350 - 7.450 7.427 7.361 - - -   PCO2ART 32.0 - 48.0 mmHg 35.9 45.9 - - -   HCO3 20.0 - 28.0 mmol/L 23.2 25.4 - - -   ACIDBASEDEF 0.0 - 2.0 mmol/L 0.6 - - - -   O2SAT % 96.8 96.7 - - -       Pulmonary Assessment Scores: Pulmonary Assessment Scores    Row Name 04/09/19 1513         ADL UCSD   SOB Score total  62     Rest  2     Walk  4     Stairs  5     Bath  2     Dress  2     Shop  2       CAT Score   CAT Score  25       mMRC Score   mMRC Score  2        UCSD: Self-administered rating of dyspnea associated with activities of daily living (ADLs) 6-point scale (0 = "not at all" to 5 = "maximal or unable to do because of breathlessness")  Scoring Scores range from 0 to 120.  Minimally important difference is 5 units  CAT: CAT can identify the health impairment of COPD patients and is better correlated with disease progression.  CAT has a scoring range of zero to 40. The CAT score is classified into four groups of low (less than 10), medium (10 - 20), high (21-30) and very high (31-40) based on the impact level of disease on health status. A CAT score over 10 suggests significant symptoms.  A worsening CAT score could be explained by an exacerbation, poor medication adherence, poor inhaler technique, or progression of COPD or comorbid conditions.  CAT MCID is 2 points  mMRC: mMRC (Modified Medical Research Council) Dyspnea Scale is used to assess the degree of baseline functional disability in patients of respiratory disease due to dyspnea. No minimal important difference is established. A decrease in score of 1 point or greater is considered a positive change.   Pulmonary Function Assessment:   Exercise Target  Goals: Exercise Program Goal: Individual exercise prescription set using results from initial 6 min walk test and THRR while considering  patient's activity barriers and safety.   Exercise Prescription  Goal: Initial exercise prescription builds to 30-45 minutes a day of aerobic activity, 2-3 days per week.  Home exercise guidelines will be given to patient during program as part of exercise prescription that the participant will acknowledge.  Activity Barriers & Risk Stratification: Activity Barriers & Cardiac Risk Stratification - 04/03/19 1218      Activity Barriers & Cardiac Risk Stratification   Activity Barriers  Deconditioning;Muscular Weakness;Shortness of Breath;History of Falls       6 Minute Walk: 6 Minute Walk    Row Name 04/09/19 1504         6 Minute Walk   Phase  Initial     Distance  1200 feet     Walk Time  6 minutes     # of Rest Breaks  0     MPH  2.27     METS  3.37     RPE  17     Perceived Dyspnea   3     VO2 Peak  11.81     Symptoms  No     Resting HR  79 bpm     Resting BP  136/84     Resting Oxygen Saturation   97 %     Exercise Oxygen Saturation  during 6 min walk  94 %     Max Ex. HR  105 bpm     Max Ex. BP  164/82     2 Minute Post BP  138/86       Interval HR   1 Minute HR  92     2 Minute HR  100     3 Minute HR  103     4 Minute HR  105     5 Minute HR  104     6 Minute HR  103     2 Minute Post HR  86     Interval Heart Rate?  Yes       Interval Oxygen   Interval Oxygen?  Yes     Baseline Oxygen Saturation %  97 %     1 Minute Oxygen Saturation %  98 %     1 Minute Liters of Oxygen  0 L     2 Minute Oxygen Saturation %  94 %     2 Minute Liters of Oxygen  0 L     3 Minute Oxygen Saturation %  95 %     3 Minute Liters of Oxygen  0 L     4 Minute Oxygen Saturation %  95 %     4 Minute Liters of Oxygen  0 L     5 Minute Oxygen Saturation %  95 %     5 Minute Liters of Oxygen  0 L     6 Minute Oxygen Saturation %  96 %     6  Minute Liters of Oxygen  0 L     2 Minute Post Oxygen Saturation %  99 %     2 Minute Post Liters of Oxygen  0 L       Oxygen Initial Assessment: Oxygen Initial Assessment - 04/03/19 1221      Home Oxygen   Home Oxygen Device  None    Sleep Oxygen Prescription  CPAP    Home Exercise Oxygen Prescription  None    Home at Rest Exercise Oxygen Prescription  None    Compliance with Home Oxygen Use  Yes  Initial 6 min Walk   Oxygen Used  None      Program Oxygen Prescription   Program Oxygen Prescription  None      Intervention   Short Term Goals  To learn and exhibit compliance with exercise, home and travel O2 prescription;To learn and understand importance of monitoring SPO2 with pulse oximeter and demonstrate accurate use of the pulse oximeter.;To learn and understand importance of maintaining oxygen saturations>88%;To learn and demonstrate proper pursed lip breathing techniques or other breathing techniques.;To learn and demonstrate proper use of respiratory medications    Long  Term Goals  Exhibits compliance with exercise, home and travel O2 prescription;Verbalizes importance of monitoring SPO2 with pulse oximeter and return demonstration;Maintenance of O2 saturations>88%;Exhibits proper breathing techniques, such as pursed lip breathing or other method taught during program session;Compliance with respiratory medication;Demonstrates proper use of MDI's       Oxygen Re-Evaluation: Oxygen Re-Evaluation    Row Name 10/17/18 1424 10/29/18 1537           Program Oxygen Prescription   Program Oxygen Prescription  None  None        Home Oxygen   Home Oxygen Device  None  None      Sleep Oxygen Prescription  None  None      Home Exercise Oxygen Prescription  None  None      Home at Rest Exercise Oxygen Prescription  None  None        Goals/Expected Outcomes   Short Term Goals  To learn and exhibit compliance with exercise, home and travel O2 prescription;To learn and  understand importance of monitoring SPO2 with pulse oximeter and demonstrate accurate use of the pulse oximeter.;To learn and understand importance of maintaining oxygen saturations>88%;To learn and demonstrate proper pursed lip breathing techniques or other breathing techniques.;To learn and demonstrate proper use of respiratory medications  To learn and exhibit compliance with exercise, home and travel O2 prescription;To learn and understand importance of monitoring SPO2 with pulse oximeter and demonstrate accurate use of the pulse oximeter.;To learn and understand importance of maintaining oxygen saturations>88%;To learn and demonstrate proper pursed lip breathing techniques or other breathing techniques.;To learn and demonstrate proper use of respiratory medications      Long  Term Goals  Exhibits compliance with exercise, home and travel O2 prescription;Verbalizes importance of monitoring SPO2 with pulse oximeter and return demonstration;Maintenance of O2 saturations>88%;Exhibits proper breathing techniques, such as pursed lip breathing or other method taught during program session;Compliance with respiratory medication;Demonstrates proper use of MDI's  Exhibits compliance with exercise, home and travel O2 prescription;Verbalizes importance of monitoring SPO2 with pulse oximeter and return demonstration;Maintenance of O2 saturations>88%;Exhibits proper breathing techniques, such as pursed lip breathing or other method taught during program session;Compliance with respiratory medication;Demonstrates proper use of MDI's      Comments  Reviewed PLB technique with pt.  Talked about how it work and it's important to maintaining his exercise saturations.  She uses her inhaler as needed.  She is doing a sleep study tonight.  She has been using her PLB at home and finds it helpful.  She will keep [place] updated on her sleep study.      Goals/Expected Outcomes  Short: Become more profiecient at using PLB.   Long:  Become independent at using PLB.  Short: Continue to use PLB.  Long: Continued compliance.         Oxygen Discharge (Final Oxygen Re-Evaluation): Oxygen Re-Evaluation - 10/29/18 1537      Program  Oxygen Prescription   Program Oxygen Prescription  None      Home Oxygen   Home Oxygen Device  None    Sleep Oxygen Prescription  None    Home Exercise Oxygen Prescription  None    Home at Rest Exercise Oxygen Prescription  None      Goals/Expected Outcomes   Short Term Goals  To learn and exhibit compliance with exercise, home and travel O2 prescription;To learn and understand importance of monitoring SPO2 with pulse oximeter and demonstrate accurate use of the pulse oximeter.;To learn and understand importance of maintaining oxygen saturations>88%;To learn and demonstrate proper pursed lip breathing techniques or other breathing techniques.;To learn and demonstrate proper use of respiratory medications    Long  Term Goals  Exhibits compliance with exercise, home and travel O2 prescription;Verbalizes importance of monitoring SPO2 with pulse oximeter and return demonstration;Maintenance of O2 saturations>88%;Exhibits proper breathing techniques, such as pursed lip breathing or other method taught during program session;Compliance with respiratory medication;Demonstrates proper use of MDI's    Comments  She uses her inhaler as needed.  She is doing a sleep study tonight.  She has been using her PLB at home and finds it helpful.  She will keep [place] updated on her sleep study.    Goals/Expected Outcomes  Short: Continue to use PLB.  Long: Continued compliance.       Initial Exercise Prescription: Initial Exercise Prescription - 04/09/19 1500      Treadmill   MPH  2.2    Grade  1.5    Minutes  15    METs  3.1      Recumbant Bike   Level  2    RPM  50    Watts  45    Minutes  15    METs  3.1      Arm Ergometer   Level  1    RPM  30    Minutes  15    METs  2.5      Elliptical   Level   1    Speed  3    Minutes  15      Intensity   THRR 40-80% of Max Heartrate  116-153    Ratings of Perceived Exertion  11-15    Perceived Dyspnea  0-4      Resistance Training   Training Prescription  Yes    Weight  3 lb    Reps  10-15       Perform Capillary Blood Glucose checks as needed.  Exercise Prescription Changes: Exercise Prescription Changes    Row Name 10/29/18 1600 11/14/18 1000           Response to Exercise   Blood Pressure (Admit)  --  134/56      Blood Pressure (Exercise)  --  138/70      Blood Pressure (Exit)  --  118/70      Heart Rate (Admit)  --  78 bpm      Heart Rate (Exercise)  --  134 bpm      Heart Rate (Exit)  --  95 bpm      Oxygen Saturation (Admit)  --  95 %      Oxygen Saturation (Exercise)  --  94 %      Oxygen Saturation (Exit)  --  93 %      Rating of Perceived Exertion (Exercise)  --  15      Perceived Dyspnea (Exercise)  --  1      Symptoms  --  none      Comments  --  ellipitcal is tough      Duration  --  Continue with 30 min of aerobic exercise without signs/symptoms of physical distress.      Intensity  --  THRR unchanged        Progression   Progression  --  Continue to progress workloads to maintain intensity without signs/symptoms of physical distress.      Average METs  --  3.2        Resistance Training   Training Prescription  --  Yes      Weight  --  3 lbs      Reps  --  10-15        Interval Training   Interval Training  --  No        Treadmill   MPH  --  2.5      Grade  --  2      Minutes  --  15      METs  --  3.6        Recumbant Bike   Level  --  3      Watts  --  29      Minutes  --  15      METs  --  3.05        Elliptical   Level  --  1      Speed  --  3      Minutes  --  15        Biostep-RELP   Level  --  3      Minutes  --  15      METs  --  3        Home Exercise Plan   Plans to continue exercise at  Home (comment) walking, videos  Home (comment) walking, videos      Frequency  Add  2 additional days to program exercise sessions.  Add 2 additional days to program exercise sessions.      Initial Home Exercises Provided  10/29/18  10/29/18         Exercise Comments:   Exercise Goals and Review: Exercise Goals    Row Name 04/09/19 1519             Exercise Goals   Increase Physical Activity  Yes       Intervention  Provide advice, education, support and counseling about physical activity/exercise needs.;Develop an individualized exercise prescription for aerobic and resistive training based on initial evaluation findings, risk stratification, comorbidities and participant's personal goals.       Expected Outcomes  Short Term: Attend rehab on a regular basis to increase amount of physical activity.;Long Term: Add in home exercise to make exercise part of routine and to increase amount of physical activity.;Long Term: Exercising regularly at least 3-5 days a week.       Increase Strength and Stamina  Yes       Intervention  Provide advice, education, support and counseling about physical activity/exercise needs.;Develop an individualized exercise prescription for aerobic and resistive training based on initial evaluation findings, risk stratification, comorbidities and participant's personal goals.       Expected Outcomes  Short Term: Increase workloads from initial exercise prescription for resistance, speed, and METs.;Short Term: Perform resistance training exercises routinely during rehab and add in resistance training at home;Long Term: Improve cardiorespiratory fitness,  muscular endurance and strength as measured by increased METs and functional capacity (6MWT)       Able to understand and use rate of perceived exertion (RPE) scale  Yes       Intervention  Provide education and explanation on how to use RPE scale       Expected Outcomes  Short Term: Able to use RPE daily in rehab to express subjective intensity level;Long Term:  Able to use RPE to guide intensity level  when exercising independently       Able to understand and use Dyspnea scale  Yes       Intervention  Provide education and explanation on how to use Dyspnea scale       Expected Outcomes  Short Term: Able to use Dyspnea scale daily in rehab to express subjective sense of shortness of breath during exertion;Long Term: Able to use Dyspnea scale to guide intensity level when exercising independently       Knowledge and understanding of Target Heart Rate Range (THRR)  Yes       Intervention  Provide education and explanation of THRR including how the numbers were predicted and where they are located for reference       Expected Outcomes  Short Term: Able to state/look up THRR;Short Term: Able to use daily as guideline for intensity in rehab;Long Term: Able to use THRR to govern intensity when exercising independently       Able to check pulse independently  Yes       Intervention  Provide education and demonstration on how to check pulse in carotid and radial arteries.;Review the importance of being able to check your own pulse for safety during independent exercise       Expected Outcomes  Short Term: Able to explain why pulse checking is important during independent exercise;Long Term: Able to check pulse independently and accurately       Understanding of Exercise Prescription  Yes       Intervention  Provide education, explanation, and written materials on patient's individual exercise prescription       Expected Outcomes  Short Term: Able to explain program exercise prescription;Long Term: Able to explain home exercise prescription to exercise independently          Exercise Goals Re-Evaluation : Exercise Goals Re-Evaluation    Row Name 10/17/18 1422 10/29/18 1606 11/13/18 1541         Exercise Goal Re-Evaluation   Exercise Goals Review  Increase Physical Activity;Able to understand and use rate of perceived exertion (RPE) scale;Knowledge and understanding of Target Heart Rate Range  (THRR);Understanding of Exercise Prescription;Increase Strength and Stamina;Able to understand and use Dyspnea scale;Able to check pulse independently  Increase Physical Activity;Able to understand and use rate of perceived exertion (RPE) scale;Knowledge and understanding of Target Heart Rate Range (THRR);Understanding of Exercise Prescription;Increase Strength and Stamina;Able to understand and use Dyspnea scale;Able to check pulse independently  Increase Physical Activity;Increase Strength and Stamina;Understanding of Exercise Prescription     Comments  Reviewed RPE scale, THR and program prescription with pt today.  Pt voiced understanding and was given a copy of goals to take home.  [name] is off to a good start in rehab.  She wants to go ahead and start walking more on her off days.  Reviewed home exercise with pt today.  Pt plans to walk and use staff videos at home for exercise.  Reviewed THR, pulse, RPE, sign and symptoms, and when to call 911 or  MD.  Also discussed weather considerations and indoor options.  Pt voiced understanding.  [name] has been doing well in rehab.  She is working hard to [employer], [employer] the elliptical and is determined to get to her full 15 min!  We will continue to monitor her progression.     Expected Outcomes  Short: Use RPE daily to regulate intensity. Long: Follow program prescription in THR.  Short: Add in exercise on off days from rehab.  Long: Continue to improve stamina.  Short: Return to rehab regularly  Long: Continue to improve stamina and exercise for  mental boost        Discharge Exercise Prescription (Final Exercise Prescription Changes): Exercise Prescription Changes - 11/14/18 1000      Response to Exercise   Blood Pressure (Admit)  134/56    Blood Pressure (Exercise)  138/70    Blood Pressure (Exit)  118/70    Heart Rate (Admit)  78 bpm    Heart Rate (Exercise)  134 bpm    Heart Rate (Exit)  95 bpm    Oxygen Saturation (Admit)  95 %    Oxygen Saturation  (Exercise)  94 %    Oxygen Saturation (Exit)  93 %    Rating of Perceived Exertion (Exercise)  15    Perceived Dyspnea (Exercise)  1    Symptoms  none    Comments  ellipitcal is tough    Duration  Continue with 30 min of aerobic exercise without signs/symptoms of physical distress.    Intensity  THRR unchanged      Progression   Progression  Continue to progress workloads to maintain intensity without signs/symptoms of physical distress.    Average METs  3.2      Resistance Training   Training Prescription  Yes    Weight  3 lbs    Reps  10-15      Interval Training   Interval Training  No      Treadmill   MPH  2.5    Grade  2    Minutes  15    METs  3.6      Recumbant Bike   Level  3    Watts  29    Minutes  15    METs  3.05      Elliptical   Level  1    Speed  3    Minutes  15      Biostep-RELP   Level  3    Minutes  15    METs  3      Home Exercise Plan   Plans to continue exercise at  Home (comment)   walking, videos   Frequency  Add 2 additional days to program exercise sessions.    Initial Home Exercises Provided  10/29/18       Nutrition:  Target Goals: Understanding of nutrition guidelines, daily intake of sodium <1520m, cholesterol <2079m calories 30% from fat and 7% or less from saturated fats, daily to have 5 or more servings of fruits and vegetables.  Biometrics:    Nutrition Therapy Plan and Nutrition Goals:   Nutrition Assessments:   Nutrition Goals Re-Evaluation: Nutrition Goals Re-Evaluation    [place] 10/22/18 1444             Goals   Nutrition Goal  ST: Pt wants to try hello fresh LT: Weight loss 50lb (5-10lbs now)       Comment  Pt reports she and her husband have had  a lot going on and she will get hello fresh within the next two weeks. Pt reports otherwise doing ok, talking with her doctor about therapy.       Expected Outcome  ST: Pt wants to try hello fresh LT: Weight loss 50lb (5-10lbs now)          Nutrition  Goals Discharge (Final Nutrition Goals Re-Evaluation): Nutrition Goals Re-Evaluation - 10/22/18 1444      Goals   Nutrition Goal  ST: Pt wants to try hello fresh LT: Weight loss 50lb (5-10lbs now)    Comment  Pt reports she and her husband have had a lot going on and she will get hello fresh within the next two weeks. Pt reports otherwise doing ok, talking with her doctor about therapy.    Expected Outcome  ST: Pt wants to try hello fresh LT: Weight loss 50lb (5-10lbs now)       Psychosocial: Target Goals: Acknowledge presence or absence of significant depression and/or stress, maximize coping skills, provide positive support system. Participant is able to verbalize types and ability to use techniques and skills needed for reducing stress and depression.   Initial Review & Psychosocial Screening: Initial Psych Review & Screening - 04/03/19 1219      Initial Review   Current issues with  Current Psychotropic Meds;Current Sleep Concerns;Current Depression;Current Stress Concerns    Source of Stress Concerns  Chronic Illness;Family;Poor Coping Skills;Unable to participate in former interests or hobbies;Unable to perform yard/household activities    Comments  She stresses about many things.  She has been caring for her aunt over the past year.  She has lost several family members to suicide and murder. She wants to breathe better to be able to do more at home.      Family Dynamics   Good Support System?  Yes   Husband, aunt, son   Comments  Recent loss of family and friends (4 in the last two months)      Barriers   Psychosocial barriers to participate in program  The patient should benefit from training in stress management and relaxation.;Psychosocial barriers identified (see note)      Screening Interventions   Interventions  Encouraged to exercise;To provide support and resources with identified psychosocial needs;Provide feedback about the scores to participant    Expected Outcomes   Short Term goal: Utilizing psychosocial counselor, staff and physician to assist with identification of specific Stressors or current issues interfering with healing process. Setting desired goal for each stressor or current issue identified.;Long Term Goal: Stressors or current issues are controlled or eliminated.;Short Term goal: Identification and review with participant of any Quality of Life or Depression concerns found by scoring the questionnaire.;Long Term goal: The participant improves quality of Life and PHQ9 Scores as seen by post scores and/or verbalization of changes       Quality of Life Scores:  Scores of 19 and below usually indicate a poorer quality of life in these areas.  A difference of  2-3 points is a clinically meaningful difference.  A difference of 2-3 points in the total score of the Quality of Life Index has been associated with significant improvement in overall quality of life, self-image, physical symptoms, and general health in studies assessing change in quality of life.  PHQ-9: Recent Review Flowsheet Data    Depression screen [facility] 2/9 04/09/2019 10/29/2018   Decreased Interest 3 3   Down, Depressed, Hopeless 3 3   PHQ - 2 Score 6 6  Altered sleeping 3 1   Tired, decreased energy 3 3   Change in appetite 3 3   Feeling bad or failure about yourself  2 3   Trouble concentrating 3 3   Moving slowly or fidgety/restless 1 2   Suicidal thoughts 0 0   PHQ-9 Score 21 21   Difficult doing work/chores - Very difficult     Interpretation of Total Score  Total Score Depression Severity:  1-4 = Minimal depression, 5-9 = Mild depression, 10-14 = Moderate depression, 15-19 = Moderately severe depression, 20-27 = Severe depression   Psychosocial Evaluation and Intervention: Psychosocial Evaluation - 04/03/19 1226      Psychosocial Evaluation & Interventions   Interventions  Stress management education;Encouraged to exercise with the program and follow exercise  prescription    Comments  Selinda Eon is coming back to rehab after taking time off to help care for her aunt over the past year.  Seh has had a rough few months after losing family to suicide and murder.  She has a significant history of depression and anxiety but feels that her meds are helping to keep it manageable for her.  She really is looking forward to returning to rehab again! So much so that she turned up in person for her phone call today!  She wants to work on her breathing and lose the weight she has gained over the last few months.    Expected Outcomes  Short: Attend rehab regularly and use exerise as a stress outlet.  Long: Work on weight loss and breathing to be more functional.    Continue Psychosocial Services   Follow up required by staff       Psychosocial Re-Evaluation: Psychosocial Re-Evaluation    [place] Name 10/29/18 1529 11/13/18 1542           Psychosocial Re-Evaluation   Current issues with  Current Stress Concerns;Current Depression;Current Psychotropic Meds  Current Sleep Concerns      Comments  [name] is doing well in rehab. She continues to feel sad all the time, but does note that it is better on days she exercises, so she is going to try to exercise a little more often.  She feels that she puts on a happy face.  She is taking lexapro and wellbutrin.  The lexapro is newer.  The wellbutrin has been more long term and we talked about maybe talking with her doctor about uping her dosage. Completed PHQ9 questionnarie today.  She uses medications to sleep and is completing a sleep study tonight at home.  [name] completed her sleep study and has been diagnosed with sleep apnea.  She will start using a CPAP.      Expected Outcomes  Short: Talk to doctor about meds.  Long: Continue work on improving depression.  Short: CPAP compliance.  Long: Improved sleep quality.      Interventions  Stress management education;Encouraged to attend Pulmonary Rehabilitation for the exercise   --      Continue Psychosocial Services   Follow up required by staff  Follow up required by staff      Comments  Stress is part of her nature  --         Psychosocial Discharge (Final Psychosocial Re-Evaluation): Psychosocial Re-Evaluation - 11/13/18 1542      Psychosocial Re-Evaluation   Current issues with  Current Sleep Concerns    Comments  [name] completed her sleep study and has been diagnosed with sleep apnea.  She will start using  a CPAP.    Expected Outcomes  Short: CPAP compliance.  Long: Improved sleep quality.    Continue Psychosocial Services   Follow up required by staff       Education: Education Goals: Education classes will be provided on a weekly basis, covering required topics. Participant will state understanding/return demonstration of topics presented.  Learning Barriers/Preferences: Learning Barriers/Preferences - 04/03/19 1219      Learning Barriers/Preferences   Learning Barriers  Hearing    Learning Preferences  None       Education Topics:  Initial Evaluation Education: - Verbal, written and demonstration of respiratory meds, oximetry and breathing techniques. Instruction on use of nebulizers and MDIs and importance of monitoring MDI activations.   General Nutrition Guidelines/Fats and Fiber: -Group instruction provided by verbal, written material, models and posters to present the general guidelines for heart healthy nutrition. Gives an explanation and review of dietary fats and fiber.   Controlling Sodium/Reading Food Labels: -Group verbal and written material supporting the discussion of sodium use in heart healthy nutrition. Review and explanation with models, verbal and written materials for utilization of the food label.   Exercise Physiology & General Exercise Guidelines: - Group verbal and written instruction with models to review the exercise physiology of the cardiovascular system and associated critical values. Provides general  exercise guidelines with specific guidelines to those with heart or lung disease.    Aerobic Exercise & Resistance Training: - Gives group verbal and written instruction on the various components of exercise. Focuses on aerobic and resistive training programs and the benefits of this training and how to safely progress through these programs.   Flexibility, Balance, Mind/Body Relaxation: Provides group verbal/written instruction on the benefits of flexibility and balance training, including mind/body exercise modes such as yoga, pilates and tai chi.  Demonstration and skill practice provided.   Stress and Anxiety: - Provides group verbal and written instruction about the health risks of elevated stress and causes of high stress.  Discuss the correlation between heart/lung disease and anxiety and treatment options. Review healthy ways to manage with stress and anxiety.   Depression: - Provides group verbal and written instruction on the correlation between heart/lung disease and depressed mood, treatment options, and the stigmas associated with seeking treatment.   Exercise & Equipment Safety: - Individual verbal instruction and demonstration of equipment use and safety with use of the equipment.   Pulmonary Rehab from 10/11/2018 in [facility] Cardiac and Pulmonary Rehab  Date  10/11/18  Educator  AS  Instruction Review Code  1- Verbalizes Understanding      Infection Prevention: - Provides verbal and written material to individual with discussion of infection control including proper hand washing and proper equipment cleaning during exercise session.   Pulmonary Rehab from 10/11/2018 in [facility] Cardiac and Pulmonary Rehab  Date  10/11/18  Educator  AS  Instruction Review Code  1- Verbalizes Understanding      Falls Prevention: - Provides verbal and written material to individual with discussion of falls prevention and safety.   Pulmonary Rehab from 10/11/2018 in [facility] Cardiac and Pulmonary  Rehab  Date  10/11/18  Educator  AS  Instruction Review Code  1- Verbalizes Understanding      Diabetes: - Individual verbal and written instruction to review signs/symptoms of diabetes, desired ranges of glucose level fasting, after meals and with exercise. Advice that pre and post exercise glucose checks will be done for 3 sessions at entry of program.   Chronic Lung Diseases: -  Group verbal and written instruction to review updates, respiratory medications, advancements in procedures and treatments. Discuss use of supplemental oxygen including available portable oxygen systems, continuous and intermittent flow rates, concentrators, personal use and safety guidelines. Review proper use of inhaler and spacers. Provide informative websites for self-education.    Energy Conservation: - Provide group verbal and written instruction for methods to conserve energy, plan and organize activities. Instruct on pacing techniques, use of adaptive equipment and posture/positioning to relieve shortness of breath.   Triggers and Exacerbations: - Group verbal and written instruction to review types of environmental triggers and ways to prevent exacerbations. Discuss weather changes, air quality and the benefits of nasal washing. Review warning signs and symptoms to help prevent infections. Discuss techniques for effective airway clearance, coughing, and vibrations.   AED/CPR: - Group verbal and written instruction with the use of models to demonstrate the basic use of the AED with the basic ABC's of resuscitation.   Anatomy and Physiology of the Lungs: - Group verbal and written instruction with the use of models to provide basic lung anatomy and physiology related to function, structure and complications of lung disease.   Anatomy & Physiology of the Heart: - Group verbal and written instruction and models provide basic cardiac anatomy and physiology, with the coronary electrical and arterial  systems. Review of Valvular disease and Heart Failure   Cardiac Medications: - Group verbal and written instruction to review commonly prescribed medications for heart disease. Reviews the medication, class of the drug, and side effects.   Know Your Numbers and Risk Factors: -Group verbal and written instruction about important numbers in your health.  Discussion of what are risk factors and how they play a role in the disease process.  Review of Cholesterol, Blood Pressure, Diabetes, and BMI and the role they play in your overall health.   Sleep Hygiene: -Provides group verbal and written instruction about how sleep can affect your health.  Define sleep hygiene, discuss sleep cycles and impact of sleep habits. Review good sleep hygiene tips.    Other: -Provides group and verbal instruction on various topics (see comments)    Knowledge Questionnaire Score: Knowledge Questionnaire Score - 04/09/19 1518      Knowledge Questionnaire Score   Pre Score  15/18        Core Components/Risk Factors/Patient Goals at Admission: Personal Goals and Risk Factors at Admission - 04/09/19 1502      Core Components/Risk Factors/Patient Goals on Admission   Intervention  Weight Management: Develop a combined nutrition and exercise program designed to reach desired caloric intake, while maintaining appropriate intake of nutrient and fiber, sodium and fats, and appropriate energy expenditure required for the weight goal.;Weight Management: Provide education and appropriate resources to help participant work on and attain dietary goals.;Weight Management/Obesity: Establish reasonable short term and long term weight goals.;Obesity: Provide education and appropriate resources to help participant work on and attain dietary goals.    Admit Weight  226 lb (102.5 kg)    Goal Weight: Short Term  215 lb (97.5 kg)    Goal Weight: Long Term  200 lb (90.7 kg)    Expected Outcomes  Short Term: Continue to assess  and modify interventions until short term weight is achieved;Long Term: Adherence to nutrition and physical activity/exercise program aimed toward attainment of established weight goal;Weight Loss: Understanding of general recommendations for a balanced deficit meal plan, which promotes 1-2 lb weight loss per week and includes a negative energy balance of [phone]  kcal/d;Understanding recommendations for meals to include 15-35% energy as protein, 25-35% energy from fat, 35-60% energy from carbohydrates, less than 282m of dietary cholesterol, 20-35 gm of total fiber daily;Understanding of distribution of calorie intake throughout the day with the consumption of 4-5 meals/snacks    Intervention  Provide education on lifestyle modifcations including regular physical activity/exercise, weight management, moderate sodium restriction and increased consumption of fresh fruit, vegetables, and low fat dairy, alcohol moderation, and smoking cessation.;Monitor prescription use compliance.    Expected Outcomes  Short Term: Continued assessment and intervention until BP is < 140/967mHG in hypertensive participants. < 130/8059mG in hypertensive participants with diabetes, heart failure or chronic kidney disease.;Long Term: Maintenance of blood pressure at goal levels.       Core Components/Risk Factors/Patient Goals Review:  Goals and Risk Factor Review    Row Name 10/29/18 1535             Core Components/Risk Factors/Patient Goals Review   Personal Goals Review  Weight Management/Obesity;Hypertension;Lipids       Review  MicSharyn Lull doing well in weight.  Today she was up 2lbs but has started her cycle this week too, but no increased SOB.  Blood pressures are doing good, but she does not check at home as she does not have a cuff.  If she is out at a pharmacy she will get it checked.  She is doing well with her medications.       Expected Outcomes  Short: Continue to work on weight loss.  Long: Continue to  montior risk factors.          Core Components/Risk Factors/Patient Goals at Discharge (Final Review):  Goals and Risk Factor Review - 10/29/18 1535      Core Components/Risk Factors/Patient Goals Review   Personal Goals Review  Weight Management/Obesity;Hypertension;Lipids    Review  MicSharyn Lull doing well in weight.  Today she was up 2lbs but has started her cycle this week too, but no increased SOB.  Blood pressures are doing good, but she does not check at home as she does not have a cuff.  If she is out at a pharmacy she will get it checked.  She is doing well with her medications.    Expected Outcomes  Short: Continue to work on weight loss.  Long: Continue to montior risk factors.       ITP Comments: ITP Comments    Row Name 10/17/18 1422 11/07/18 1240 12/03/18 1419 12/04/18 1151 12/05/18 0615   ITP Comments  First full day of exercise!  Patient was oriented to gym and equipment including functions, settings, policies, and procedures.  Patient's individual exercise prescription and treatment plan were reviewed.  All starting workloads were established based on the results of the 6 minute walk test done at initial orientation visit.  The plan for exercise progression was also introduced and progression will be customized based on patient's performance and goals.  30 day review completed. ITP sent to Dr. MarEmily Filbertedical Director of Cardiac and Pulmonary Rehab. Continue with ITP unless changes are made by physician.  Department closed starting 10/2 until further notice by infection prevention and Health at Work teams for [place]. She has not been able to be here this month due to caring for her aunt.  Unable to complete nutrition 30-day re-eval cycle due to inconsistent attendence  30 day review completed. Continue with ITP sent to Dr. MarEmily Filbertedical Director of Cardiac  and Pulmonary Rehab for review , changes as needed and signature.  Last visit 9/30   Calls have been made   [place] Name 01/01/19 1144 01/01/19 1429 04/03/19 1240       ITP Comments  Called to check on pt.  Out since 10/31/18 caring for her aunt.  Left message.  Sent message on MyChart.  [name] returned our message.  She would like to discharge at this time.  Completed virtual orientation today.  EP evaluation is scheduled for Tuesday 3/9 at 2pm.  Documentation for diagnosis can be found in [facility] encounter 03/18/19.  She will restart on session number 8.        Comments: initial ITP

## 2019-04-09 NOTE — Patient Instructions (Addendum)
Patient Instructions  Patient Details  Name: Samantha Greene MRN: [id number] Date of Birth: 09-22-1970 Referring Provider:  Lucky Cowboy, MD  Below are your personal goals for exercise, nutrition, and risk factors. Our goal is to help you stay on track towards obtaining and maintaining these goals. We will be discussing your progress on these goals with you throughout the program.  Initial Exercise Prescription: Initial Exercise Prescription - 04/09/19 1500      Treadmill   MPH  2.2    Grade  1.5    Minutes  15    METs  3.1      Recumbant Bike   Level  2    RPM  50    Watts  45    Minutes  15    METs  3.1      Arm Ergometer   Level  1    RPM  30    Minutes  15    METs  2.5      Elliptical   Level  1    Speed  3    Minutes  15      Intensity   THRR 40-80% of Max Heartrate  116-153    Ratings of Perceived Exertion  11-15    Perceived Dyspnea  0-4      Resistance Training   Training Prescription  Yes    Weight  3 lb    Reps  10-15       Exercise Goals: Frequency: Be able to perform aerobic exercise two to three times per week in program working toward 2-5 days per week of home exercise.  Intensity: Work with a perceived exertion of 11 (fairly light) - 15 (hard) while following your exercise prescription.  We will make changes to your prescription with you as you progress through the program.   Duration: Be able to do 30 to 45 minutes of continuous aerobic exercise in addition to a 5 minute warm-up and a 5 minute cool-down routine.   Nutrition Goals: Your personal nutrition goals will be established when you do your nutrition analysis with the dietician.  The following are general nutrition guidelines to follow: Cholesterol < 200mg /day Sodium < 1500mg /day Fiber: Women under 50 yrs - 25 grams per day  Personal Goals: Personal Goals and Risk Factors at Admission - 04/09/19 1502      Core Components/Risk Factors/Patient Goals on Admission    Intervention  Weight Management: Develop a combined nutrition and exercise program designed to reach desired caloric intake, while maintaining appropriate intake of nutrient and fiber, sodium and fats, and appropriate energy expenditure required for the weight goal.;Weight Management: Provide education and appropriate resources to help participant work on and attain dietary goals.;Weight Management/Obesity: Establish reasonable short term and long term weight goals.;Obesity: Provide education and appropriate resources to help participant work on and attain dietary goals.    Admit Weight  226 lb (102.5 kg)    Goal Weight: Short Term  215 lb (97.5 kg)    Goal Weight: Long Term  200 lb (90.7 kg)    Expected Outcomes  Short Term: Continue to assess and modify interventions until short term weight is achieved;Long Term: Adherence to nutrition and physical activity/exercise program aimed toward attainment of established weight goal;Weight Loss: Understanding of general recommendations for a balanced deficit meal plan, which promotes 1-2 lb weight loss per week and includes a negative energy balance of [phone] kcal/d;Understanding recommendations for meals to include 15-35% energy as protein, 25-35% energy from  fat, 35-60% energy from carbohydrates, less than 200mg  of dietary cholesterol, 20-35 gm of total fiber daily;Understanding of distribution of calorie intake throughout the day with the consumption of 4-5 meals/snacks    Intervention  Provide education on lifestyle modifcations including regular physical activity/exercise, weight management, moderate sodium restriction and increased consumption of fresh fruit, vegetables, and low fat dairy, alcohol moderation, and smoking cessation.;Monitor prescription use compliance.    Expected Outcomes  Short Term: Continued assessment and intervention until BP is < 140/49mm HG in hypertensive participants. < 130/57mm HG in hypertensive participants with diabetes, heart  failure or chronic kidney disease.;Long Term: Maintenance of blood pressure at goal levels.       Tobacco Use Initial Evaluation: Social History   Tobacco Use  Smoking Status Never Smoker  Smokeless Tobacco Never Used    Exercise Goals and Review: Exercise Goals    Row Name 04/09/19 1519             Exercise Goals   Increase Physical Activity  Yes       Intervention  Provide advice, education, support and counseling about physical activity/exercise needs.;Develop an individualized exercise prescription for aerobic and resistive training based on initial evaluation findings, risk stratification, comorbidities and participant's personal goals.       Expected Outcomes  Short Term: Attend rehab on a regular basis to increase amount of physical activity.;Long Term: Add in home exercise to make exercise part of routine and to increase amount of physical activity.;Long Term: Exercising regularly at least 3-5 days a week.       Increase Strength and Stamina  Yes       Intervention  Provide advice, education, support and counseling about physical activity/exercise needs.;Develop an individualized exercise prescription for aerobic and resistive training based on initial evaluation findings, risk stratification, comorbidities and participant's personal goals.       Expected Outcomes  Short Term: Increase workloads from initial exercise prescription for resistance, speed, and METs.;Short Term: Perform resistance training exercises routinely during rehab and add in resistance training at home;Long Term: Improve cardiorespiratory fitness, muscular endurance and strength as measured by increased METs and functional capacity (6MWT)       Able to understand and use rate of perceived exertion (RPE) scale  Yes       Intervention  Provide education and explanation on how to use RPE scale       Expected Outcomes  Short Term: Able to use RPE daily in rehab to express subjective intensity level;Long Term:   Able to use RPE to guide intensity level when exercising independently       Able to understand and use Dyspnea scale  Yes       Intervention  Provide education and explanation on how to use Dyspnea scale       Expected Outcomes  Short Term: Able to use Dyspnea scale daily in rehab to express subjective sense of shortness of breath during exertion;Long Term: Able to use Dyspnea scale to guide intensity level when exercising independently       Knowledge and understanding of Target Heart Rate Range (THRR)  Yes       Intervention  Provide education and explanation of THRR including how the numbers were predicted and where they are located for reference       Expected Outcomes  Short Term: Able to state/look up THRR;Short Term: Able to use daily as guideline for intensity in rehab;Long Term: Able to use THRR to govern intensity when exercising  independently       Able to check pulse independently  Yes       Intervention  Provide education and demonstration on how to check pulse in carotid and radial arteries.;Review the importance of being able to check your own pulse for safety during independent exercise       Expected Outcomes  Short Term: Able to explain why pulse checking is important during independent exercise;Long Term: Able to check pulse independently and accurately       Understanding of Exercise Prescription  Yes       Intervention  Provide education, explanation, and written materials on patient's individual exercise prescription       Expected Outcomes  Short Term: Able to explain program exercise prescription;Long Term: Able to explain home exercise prescription to exercise independently          Copy of goals given to participant.

## 2019-04-10 ENCOUNTER — Other Ambulatory Visit: Payer: Self-pay

## 2019-04-10 ENCOUNTER — Encounter: Payer: BC Managed Care – PPO | Admitting: *Deleted

## 2019-04-10 DIAGNOSIS — R0609 Other forms of dyspnea: Secondary | ICD-10-CM

## 2019-04-10 NOTE — Progress Notes (Signed)
Daily Session Note  Patient Details  Name: Samantha Greene MRN: [id number] Date of Birth: 12-Nov-1970 Referring Provider:     Pulmonary Rehab from 10/11/2018 in [facility]  Referring Provider  [place]      Encounter Date: 04/10/2019  Check In: Session Check In - 04/10/19 1045      Check-In   Supervising physician immediately available to respond to emergencies  See telemetry face sheet for immediately available ER MD    Location  [facility]    Staff Present  [name], [name];Joseph Hood RCP,RRT,BSRT;Jeanna Durrell BS, Exercise Physiologist    Virtual Visit  No    Medication changes reported      No    Fall or balance concerns reported     No    Warm-up and Cool-down  Performed on first and last piece of equipment    Resistance Training Performed  Yes    VAD Patient?  No    PAD/SET Patient?  No      Pain Assessment   Currently in Pain?  No/denies          Social History   Tobacco Use  Smoking Status Never Smoker  Smokeless Tobacco Never Used    Goals Met:  Independence with exercise equipment Exercise tolerated well No report of cardiac concerns or symptoms Strength training completed today  Goals Unmet:  Not Applicable  Comments: First full day of exercise!  Patient was oriented to gym and equipment including functions, settings, policies, and procedures.  Patient's individual exercise prescription and treatment plan were reviewed.  All starting workloads were established based on the results of the 6 minute walk test done at initial orientation visit.  The plan for exercise progression was also introduced and progression will be customized based on patient's performance and goals.    Dr. Emily Filbert is Medical Director for [facility].

## 2019-04-12 ENCOUNTER — Encounter: Payer: BC Managed Care – PPO | Admitting: Physical Therapy

## 2019-04-17 ENCOUNTER — Other Ambulatory Visit: Payer: Self-pay

## 2019-04-17 ENCOUNTER — Encounter: Payer: Self-pay | Admitting: Physician Assistant

## 2019-04-17 ENCOUNTER — Ambulatory Visit: Payer: BC Managed Care – PPO | Admitting: Physician Assistant

## 2019-04-17 VITALS — BP 122/86 | HR 75 | Temp 97.7°F | Wt 221.0 lb

## 2019-04-17 DIAGNOSIS — J0100 Acute maxillary sinusitis, unspecified: Secondary | ICD-10-CM

## 2019-04-17 MED ORDER — AMOXICILLIN-POT CLAVULANATE 875-125 MG PO TABS: 1.0000 | ORAL_TABLET | Freq: Two times a day (BID) | ORAL | 0 refills | Status: DC

## 2019-04-17 MED ORDER — BREO ELLIPTA 100-25 MCG/INH IN AEPB: 1.0000 | INHALATION_SPRAY | Freq: Every day | RESPIRATORY_TRACT | 0 refills | Status: DC

## 2019-04-17 NOTE — Progress Notes (Signed)
Subjective:    Patient ID: Samantha Greene, female    DOB: 03-01-1970, 49 y.o.   MRN: [id number]  HPI 49 y.o. obese WF presents with cough x 2-3 days.  She had HA Sunday to Saturday, then had vomiting on Friday morning. Then productive cough and sinus congestion start Sunday. No fever, has chills off and on. She does not have SOB or CP more than her baseline.  She has not been around anyone with covid, has not been tested for covid.  She is on allergy pill, on nasal spray, not helping.   Blood pressure 122/86, pulse 75, temperature 97.7 F (36.5 C), weight 221 lb (100.2 kg), SpO2 98 %.  BMI is Body mass index is 41.76 kg/m., she is working on diet and exercise. Wt Readings from Last 3 Encounters:  04/17/19 221 lb (100.2 kg)  03/18/19 227 lb (103 kg)  02/12/19 220 lb (99.8 kg)    Medications   Current Outpatient Medications (Cardiovascular):  .  rosuvastatin (CRESTOR) 5 MG tablet, Take 1 tablet (5 mg total) by mouth at bedtime. Marland Kitchen  telmisartan (MICARDIS) 40 MG tablet, TAKE 1 TABLET DAILY FOR BLOOD PRESSURE  Current Outpatient Medications (Respiratory):  .  albuterol (VENTOLIN HFA) 108 (90 Base) MCG/ACT inhaler, Inhale 2 puffs into the lungs every 4 (four) hours as needed for wheezing or shortness of breath. .  cetirizine (ZYRTEC) 10 MG tablet, Take 10 mg by mouth at bedtime. .  montelukast (SINGULAIR) 10 MG tablet, Take 1 tablet (10 mg total) by mouth at bedtime. Take 1 tablet daily for Allergies .  fluticasone (FLONASE) 50 MCG/ACT nasal spray, Place 2 sprays into both nostrils daily. .  fluticasone furoate-vilanterol (BREO ELLIPTA) 100-25 MCG/INH AEPB, Inhale 1 puff into the lungs daily. Rinse mouth with water after each use  Current Outpatient Medications (Analgesics):  .  aspirin 81 MG tablet, Take 81 mg by mouth daily. .  meloxicam (MOBIC) 15 MG tablet, Take 1/2 to 1 tablet Daily for Pain & Inflammation  (Limit to 4-5 tabs /week Tto Avoid Kidney Damage)   Current  Outpatient Medications (Other):  .  baclofen (LIORESAL) 10 MG tablet, Take 1/2 to 1 tablet 1 or 2 x /day as needed for Muscle Spasms .  buPROPion (WELLBUTRIN XL) 300 MG 24 hr tablet, Take 1 tablet every Morning for Mood, Focus & Concentration .  Cholecalciferol (VITAMIN D) 125 MCG (5000 UT) CAPS, Take 5,000 Units by mouth daily.  Marland Kitchen  escitalopram (LEXAPRO) 20 MG tablet, Take 1 tablet Daily for Mood .  Eszopiclone 3 MG TABS, TAKE 1 TABLET BY MOUTH AT BEDTIME AS NEEDED. TAKE IMMEDIATELY BEFORE BEDTIME .  fluocinolone (SYNALAR) 0.025 % ointment, APPLY TO AFFECTED AREA TWICE A DAY .  gabapentin (NEURONTIN) 300 MG capsule, Take 1 capsule (300 mg total) by mouth 3 (three) times daily. (Patient taking differently: Take 300 mg by mouth 3 (three) times daily. Patient taking BID) .  MELATONIN PO, Take 10 mg by mouth. .  nystatin cream (MYCOSTATIN), Apply 1 application topically 2 (two) times daily. (Patient taking differently: Apply 1 application topically 2 (two) times daily as needed for dry skin. ) .  omeprazole (PRILOSEC) 40 MG capsule, TAKE 1 CAPSULE BY MOUTH EVERY DAY .  triamcinolone ointment (KENALOG) 0.1 %, Apply 1 application topically 2 (two) times daily. (Patient taking differently: Apply 1 application topically daily as needed (rash). ) .  vitamin C (ASCORBIC ACID) 500 MG tablet, Take 500 mg by mouth daily. .  vitamin  E 600 UNIT capsule, Take 600 Units by mouth daily. Marland Kitchen  amoxicillin-clavulanate (AUGMENTIN) 875-125 MG tablet, Take 1 tablet by mouth 2 (two) times daily. 7 days  Problem list She has Insomnia; Depression; Hyperlipidemia; Hypertension; Anxiety; Dyspnea; Vitamin D deficiency; Psoriasis, guttate; Morbid obesity ([place]); Carpal tunnel syndrome on both sides; Family history of breast cancer; Family history of colon cancer; Family history of prostate cancer; Family history of lung cancer; Family history of pancreatic cancer; Genetic testing; S/P Thoracotomy with Plication of Diaphragm;  Chronic insomnia; Class 2 obesity with alveolar hypoventilation, serious comorbidity, and body mass index (BMI) of 38.0 to 38.9 in adult [facility]); SOB (shortness of breath) on exertion; OSA on CPAP; and Abnormal glucose on their problem list.   Review of Systems  Constitutional: Negative for chills and diaphoresis.  HENT: Positive for congestion, postnasal drip, sinus pressure and sneezing. Negative for ear pain and sore throat.   Respiratory: Positive for cough. Negative for chest tightness, shortness of breath and wheezing.   Cardiovascular: Negative.   Gastrointestinal: Negative.   Genitourinary: Negative.   Musculoskeletal: Negative for neck pain.  Neurological: Positive for headaches.       Objective:   Physical Exam Constitutional:      Appearance: She is well-developed.  HENT:     Head: Normocephalic and atraumatic.     Right Ear: External ear normal.     Left Ear: External ear normal.  Eyes:     Conjunctiva/sclera: Conjunctivae normal.     Pupils: Pupils are equal, round, and reactive to light.  Neck:     Thyroid: No thyromegaly.  Cardiovascular:     Rate and Rhythm: Normal rate and regular rhythm.     Heart sounds: Normal heart sounds. No murmur. No friction rub. No gallop.   Pulmonary:     Effort: Pulmonary effort is normal. No respiratory distress.     Breath sounds: Normal breath sounds. No wheezing.  Abdominal:     General: Bowel sounds are normal. There is no distension.     Palpations: Abdomen is soft. There is no mass.     Tenderness: There is no abdominal tenderness. There is no guarding or rebound.  Musculoskeletal:        General: Normal range of motion.     Cervical back: Normal range of motion and neck supple.  Lymphadenopathy:     Cervical: No cervical adenopathy.  Skin:    General: Skin is warm and dry.  Neurological:     Mental Status: She is alert and oriented to person, place, and time.     Cranial Nerves: No cranial nerve deficit.      Coordination: Coordination normal.     Deep Tendon Reflexes: Reflexes normal.           Assessment & Plan:   Samantha Greene was seen today for acute visit and cough.  Diagnoses and all orders for this visit:  Acute non-recurrent maxillary sinusitis Will hold the ABX and take if she is not getting better, increase fluids, rest, cont allergy pill Currently mild symptoms, will get tested for COVID and will do self isolation at home Suggested symptomatic OTC remedies. Nasal saline spray for congestion. Nasal steroids, allergy pill, staying hydrated Follow up as needed via mychart or text. Please go to the ER or call 911 if symptoms become worse.  -     fluticasone furoate-vilanterol (BREO ELLIPTA) 100-25 MCG/INH AEPB; Inhale 1 puff into the lungs daily. Rinse mouth with water after each use -  amoxicillin-clavulanate (AUGMENTIN) 875-125 MG tablet; Take 1 tablet by mouth 2 (two) times daily. 7 days    Future Appointments  Date Time Provider Department Center  04/19/2019 10:00 AM [facility] None  04/22/2019 10:00 AM [facility] None  04/24/2019 10:00 AM [facility] None  04/26/2019 10:00 AM [facility] None  04/29/2019 10:00 AM [facility] None  05/01/2019 10:00 AM [facility] None  05/03/2019 10:00 AM [facility] None  05/06/2019 10:00 AM [facility] None  05/08/2019 10:00 AM [facility] None  05/10/2019 10:00 AM [facility] None  05/13/2019 10:00 AM [facility] None  05/15/2019 10:00 AM [facility]  SESSION [facility] None  05/17/2019 10:00 AM [facility] S[facility] None  05/20/2019 10:00 AM [facility] S[facility] None  05/22/2019 10:00 AM [facility] S[facility] None  05/24/2019 10:00 AM [facility] S[facility] None  05/27/2019 10:00 AM [facility] None  05/29/2019 10:00 AM [facility] None  05/31/2019 10:00 AM [facility] None  06/03/2019 10:00 AM [facility] None  06/05/2019 10:00 AM [facility] S[facility] None  06/07/2019 10:00 AM [facility] None  06/10/2019 10:00 AM [facility] None  06/12/2019 10:00 AM [facility] None  06/14/2019 10:00 AM [facility] None  06/17/2019 10:00 AM [facility] None  06/17/2019 10:30 AM Quentin Mulling, PA-C [facility] None  06/19/2019 10:00 AM [facility] None  06/21/2019 10:00 AM [facility] None  06/24/2019 10:00 AM [facility] S[facility] None  06/26/2019 10:00 AM [facility] S[facility] None  06/28/2019 10:00 AM [facility] S[facility] None  07/03/2019 10:00 AM [facility] S[facility] None  07/05/2019 10:00 AM [facility] S[facility] None  07/08/2019 10:00 AM [facility] None  07/09/2019  3:45 PM McCue, Shanda Bumps, NP [facility] None  07/10/2019 10:00  AM [facility] S[facility] None  07/12/2019 10:00 AM [facility] None  07/15/2019 10:00 AM [facility] None  07/17/2019 10:00 AM [facility] S[facility] None  07/19/2019 10:00 AM [facility] None  07/22/2019 10:00 AM [facility] S[facility] None  07/24/2019 10:00 AM [facility] None  07/26/2019 10:00 AM [facility] None  07/29/2019 10:00 AM [facility] S[facility] None  07/31/2019 10:00 AM [facility] None  08/02/2019 10:00 AM [facility] None  08/07/2019 10:00 AM [facility] None  08/09/2019 10:00 AM [facility] None  08/13/2019  4:30 PM Loreli Slot, MD [facility]  01/20/2020  2:00 PM Genia Del, MD [facility]  03/17/2020 10:00 AM Quentin Mulling, PA-C [facility] None

## 2019-04-17 NOTE — Patient Instructions (Addendum)
I'm sorry that you are not feeling well at this time.   [place] is now requiring appointments for testing, you should be able to request an appointment by going to [email or web address] or by texting "COVID" to [phone].   You can also set up an appointment at [facility]  Can do steroid inhaler, NEED TO DO DAILY, this is NOT a rescue inhaler so if you are acutely short of breath please use your albuterol or call 911.  Do 1 puff ONCE a day.  Do before you brush your teeth OR wash your mouth afterwards.  IF YOU DO NOT [place] YOUR MOUTH OUT IT CAN CAUSE YEAST Can do 2 tsp vinegar with water and switch to help prevent yeast or help yeast in your mouth.   Go to the ER if any chest pain, shortness of breath, nausea, dizziness, severe HA, changes vision/speech    High risk patient's include  - age 50 or older - Chronic lung disease  - moderate to severe asthma - heart disease - severe obesity or a BMI greater than 40 - hypertension - other underlying poorly controlled chronic health conditions such as diabetes, renal failure, liver disease, immunocompromised  You can stay in contact with [place] during that time by mychart, telephone calls or we are starting virtual office visits with video capability.   Mild symptoms include  -cough  -fever- you do not have to have a fever -chills shaking due to chills muscle pain -headache -sore throat -new loss of taste or smell  - diarrhea - nausea  Severe symptoms that you need to notify us of immediately include: Difficulty breathing, chest discomfort, altered thinking and confusion.    During your quarantine things to do include Monitor your temperature twice a day.  Please do vitamin C 1000mg  twice a day- stop if you have any worsening heart burn or diarrhea.   You can take tylenol for fevers above 101 if you feel uncomfortable. Remember a fever itself is not bad, the tylenol is more for your comfort.  See the max of tylenol below.  Please do breathing exercises. Make sure that you are taking deep breaths and trying to walk around the room as much as possible.  Please lay "prone" or on your belly for up to 4 hours a day, you can split this up to 30 minute or 1 hour increments but this helps get oxygen to certain places of your lungs. Take deep breaths while you are lying prone.  Please get on 81 mg of aspirin unless you are unable to tolerate this or have a contraindication.  Please pump your feet like your are pedaling a bike to prevent clots in your legs.   Push fluids with Gatorade or electrolyte replacement in addition to water, aim for 100-120 oz a day if no contraindications. We can send in an albuterol inhaler and cough medication if you need one.   If you have worsening shortness of breath, unable to complete full sentences, are panting, please contact the office immediately for call 911. Let them know you are being monitored for coronavirus.   To stop home isolation, you will need all three of the statements below.  1) You need to be fever free for 3 days WITHOUT tylenol.  2) Your symptoms such as cough, shortness of breath need to improve.  3) It needs to be at least 10 days since your symptoms first appeared    After you stop home  isolation, please continue to wear a mask in public and continue hand washing and contact precautions.    It is vitally important that if you feel that you have an infection that you stay home and away from places where you may spread it to others.  You should self-quarantine for 10 days if you have symptoms that could potentially be coronavirus and avoid contact with people age 50 and older.     At this time we do not have a plan to get the COVID vaccine in our office. You can go to [email or web address] to find out if you are eligible.  If you are eligible you can go to [email or web address]  to find a local place that is giving the shot.     [email or web address] also has all of this information if the sites above are not working for you.   There are also several waiting list that you can sign up for here are some below:  [place] go to [email or web address] to sign up for notification when additional vaccine appointments are available.   You can also sign up at [facility] for a wait list.   [facility] and [facility] have a wait list on line and you can sign up for it there.     If you have further questions or concerns about the vaccine process, please visit [email or web address]  Individuals seeking information about the vaccines and state's phased distribution plan can learn more by going to - [email or web address]   HOW TO TREAT VIRAL COUGH AND COLD SYMPTOMS:  -Symptoms usually last at least 1 week with the worst symptoms being around day 4.  - colds usually start with a sore throat and end with a cough, and the cough can take 2 weeks to get better.  -No antibiotics are needed for colds, flu, sore throats, cough, bronchitis UNLESS symptoms are longer than 7 days OR if you are getting better then get drastically worse.  -There are a lot of combination medications (Dayquil, Nyquil, Vicks 44, tyelnol cold and sinus, ETC). Please look at the ingredients on the back so that you are treating the correct symptoms and not doubling up on medications/ingredients.    Medicines you can use  Nasal congestion  Little Remedies saline spray (aerosol/mist)- can try this, it is in the kids section - pseudoephedrine (Sudafed)- behind the counter, do not use if you have high blood pressure, medicine that have -D in them.  - phenylephrine (Sudafed PE) -Dextormethorphan + chlorpheniramine (Coridcidin HBP)- okay if you have high blood pressure -Oxymetazoline (Afrin) nasal spray- LIMIT to 3 days -Saline nasal spray -Neti pot (used distilled or bottled water)  Ear pain/congestion  -pseudoephedrine (sudafed) - Nasonex/flonase  nasal spray  Fever  -Acetaminophen (Tyelnol) -Ibuprofen (Advil, motrin, aleve)  Sore Throat  -Acetaminophen (Tyelnol) -Ibuprofen (Advil, motrin, aleve) -Drink a lot of water -Gargle with salt water - Rest your voice (don't talk) -Throat sprays -Cough drops  Body Aches  -Acetaminophen (Tyelnol) -Ibuprofen (Advil, motrin, aleve)  Headache  -Acetaminophen (Tyelnol) -Ibuprofen (Advil, motrin, aleve) - Exedrin, Exedrin Migraine  Allergy symptoms (cough, sneeze, runny nose, itchy eyes) -Claritin or loratadine cheapest but likely the weakest  -Zyrtec or certizine at night because it can make you sleepy -The strongest is allegra or fexafinadine  Cheapest at [facility], [facility], [facility]  Cough  -Dextromethorphan (Delsym)- medicine that has DM in it -Guafenesin (Mucinex/Robitussin) - cough drops - drink lots of water  Chest Congestion  -Guafenesin (Mucinex/Robitussin)  Red Itchy Eyes  - Naphcon-A  Upset Stomach  -  Bland diet (nothing spicy, greasy, fried, and high acid foods like tomatoes, oranges, berries) -OKAY- cereal, bread, soup, crackers, rice -Eat smaller more frequent meals -reduce caffeine, no alcohol -Loperamide (Imodium-AD) if diarrhea -Prevacid for heart burn  General health when sick  -Hydration -wash your hands frequently -keep surfaces clean -change pillow cases and sheets often -Get fresh air but do not exercise strenuously -Vitamin D, double up on it - Vitamin C -Zinc

## 2019-04-24 ENCOUNTER — Encounter: Payer: Self-pay | Admitting: *Deleted

## 2019-04-24 ENCOUNTER — Telehealth: Payer: Self-pay

## 2019-04-24 DIAGNOSIS — R0609 Other forms of dyspnea: Secondary | ICD-10-CM

## 2019-04-24 NOTE — Progress Notes (Signed)
Pulmonary Individual Treatment Plan  Patient Details  Name: Samantha Greene MRN: [id number] Date of Birth: 1970-11-14 Referring Provider:     Pulmonary Rehab from 10/11/2018 in [facility] Cardiac and Pulmonary Rehab  Referring Provider  [place]      Initial Encounter Date:    Pulmonary Rehab from 10/11/2018 in [facility] Cardiac and Pulmonary Rehab  Date  10/11/18      Visit Diagnosis: DOE (dyspnea on exertion)  Patient's Home Medications on Admission:  Current Outpatient Medications:  .  albuterol (VENTOLIN HFA) 108 (90 Base) MCG/ACT inhaler, Inhale 2 puffs into the lungs every 4 (four) hours as needed for wheezing or shortness of breath., Disp: 18 g, Rfl: 2 .  amoxicillin-clavulanate (AUGMENTIN) 875-125 MG tablet, Take 1 tablet by mouth 2 (two) times daily. 7 days, Disp: 20 tablet, Rfl: 0 .  aspirin 81 MG tablet, Take 81 mg by mouth daily., Disp: , Rfl:  .  baclofen (LIORESAL) 10 MG tablet, Take 1/2 to 1 tablet 1 or 2 x /day as needed for Muscle Spasms, Disp: 180 tablet, Rfl: 1 .  buPROPion (WELLBUTRIN XL) 300 MG 24 hr tablet, Take 1 tablet every Morning for Mood, Focus & Concentration, Disp: 90 tablet, Rfl: 3 .  cetirizine (ZYRTEC) 10 MG tablet, Take 10 mg by mouth at bedtime., Disp: , Rfl:  .  Cholecalciferol (VITAMIN D) 125 MCG (5000 UT) CAPS, Take 5,000 Units by mouth daily. , Disp: , Rfl:  .  escitalopram (LEXAPRO) 20 MG tablet, Take 1 tablet Daily for Mood, Disp: 90 tablet, Rfl: 3 .  Eszopiclone 3 MG TABS, TAKE 1 TABLET BY MOUTH AT BEDTIME AS NEEDED. TAKE IMMEDIATELY BEFORE BEDTIME, Disp: 30 tablet, Rfl: 3 .  fluocinolone (SYNALAR) 0.025 % ointment, APPLY TO AFFECTED AREA TWICE A DAY, Disp: 30 g, Rfl: 0 .  fluticasone (FLONASE) 50 MCG/ACT nasal spray, Place 2 sprays into both nostrils daily., Disp: 16 g, Rfl: 2 .  fluticasone furoate-vilanterol (BREO ELLIPTA) 100-25 MCG/INH AEPB, Inhale 1 puff into the lungs daily. Rinse mouth with water after each use, Disp: 1 each, Rfl: 0 .   gabapentin (NEURONTIN) 300 MG capsule, Take 1 capsule (300 mg total) by mouth 3 (three) times daily. (Patient taking differently: Take 300 mg by mouth 3 (three) times daily. Patient taking BID), Disp: 90 capsule, Rfl: 2 .  MELATONIN PO, Take 10 mg by mouth., Disp: , Rfl:  .  meloxicam (MOBIC) 15 MG tablet, Take 1/2 to 1 tablet Daily for Pain & Inflammation  (Limit to 4-5 tabs /week Tto Avoid Kidney Damage), Disp: 90 tablet, Rfl: 3 .  montelukast (SINGULAIR) 10 MG tablet, Take 1 tablet (10 mg total) by mouth at bedtime. Take 1 tablet daily for Allergies, Disp: 90 tablet, Rfl: 3 .  nystatin cream (MYCOSTATIN), Apply 1 application topically 2 (two) times daily. (Patient taking differently: Apply 1 application topically 2 (two) times daily as needed for dry skin. ), Disp: 30 g, Rfl: 1 .  omeprazole (PRILOSEC) 40 MG capsule, TAKE 1 CAPSULE BY MOUTH EVERY DAY, Disp: 90 capsule, Rfl: 3 .  rosuvastatin (CRESTOR) 5 MG tablet, Take 1 tablet (5 mg total) by mouth at bedtime., Disp: 90 tablet, Rfl: 3 .  telmisartan (MICARDIS) 40 MG tablet, TAKE 1 TABLET DAILY FOR BLOOD PRESSURE, Disp: 90 tablet, Rfl: 3 .  triamcinolone ointment (KENALOG) 0.1 %, Apply 1 application topically 2 (two) times daily. (Patient taking differently: Apply 1 application topically daily as needed (rash). ), Disp: 80 g, Rfl: 1 .  vitamin C (ASCORBIC ACID) 500 MG tablet, Take 500 mg by mouth daily., Disp: , Rfl:  .  vitamin E 600 UNIT capsule, Take 600 Units by mouth daily., Disp: , Rfl:   Past Medical History: Past Medical History:  Diagnosis Date  . Anxiety   . Arthritis   . Depression    no meds  . Diaphragmatic hernia with obstruction, without gangrene 11/07/2018  . Dyspnea    can happen anytime  . Family history of breast cancer   . Family history of colon cancer   . Family history of lung cancer   . Family history of pancreatic cancer   . Family history of prostate cancer   . GERD (gastroesophageal reflux disease)   . Hernia,  diaphragmatic   . History of kidney stones   . Hyperlipidemia   . Hypertension    no meds  . Pneumonia    1 time    Tobacco Use: Social History   Tobacco Use  Smoking Status Never Smoker  Smokeless Tobacco Never Used    Labs: Recent Review Flowsheet Data    Labs for ITP Cardiac and Pulmonary Rehab Latest Ref Rng & Units 03/21/2018 03/24/2018 09/11/2018 12/12/2018 03/18/2019   Cholestrol <200 mg/dL - - 226(H) 170 174   LDLCALC mg/dL (calc) - - 132(H) 78 82   HDL > OR = 50 mg/dL - - 62 63 69   Trlycerides <150 mg/dL - - 188(H) 194(H) 136   Hemoglobin A1c <5.7 % of total Hgb - - 5.6 5.4 5.6   PHART 7.350 - 7.450 7.427 7.361 - - -   PCO2ART 32.0 - 48.0 mmHg 35.9 45.9 - - -   HCO3 20.0 - 28.0 mmol/L 23.2 25.4 - - -   ACIDBASEDEF 0.0 - 2.0 mmol/L 0.6 - - - -   O2SAT % 96.8 96.7 - - -       Pulmonary Assessment Scores: Pulmonary Assessment Scores    Row Name 04/09/19 1513         ADL UCSD   SOB Score total  62     Rest  2     Walk  4     Stairs  5     Bath  2     Dress  2     Shop  2       CAT Score   CAT Score  25       mMRC Score   mMRC Score  2        UCSD: Self-administered rating of dyspnea associated with activities of daily living (ADLs) 6-point scale (0 = "not at all" to 5 = "maximal or unable to do because of breathlessness")  Scoring Scores range from 0 to 120.  Minimally important difference is 5 units  CAT: CAT can identify the health impairment of COPD patients and is better correlated with disease progression.  CAT has a scoring range of zero to 40. The CAT score is classified into four groups of low (less than 10), medium (10 - 20), high (21-30) and very high (31-40) based on the impact level of disease on health status. A CAT score over 10 suggests significant symptoms.  A worsening CAT score could be explained by an exacerbation, poor medication adherence, poor inhaler technique, or progression of COPD or comorbid conditions.  CAT MCID is 2  points  mMRC: mMRC (Modified Medical Research Council) Dyspnea Scale is used to assess the degree of baseline functional disability in patients of  respiratory disease due to dyspnea. No minimal important difference is established. A decrease in score of 1 point or greater is considered a positive change.   Pulmonary Function Assessment:   Exercise Target Goals: Exercise Program Goal: Individual exercise prescription set using results from initial 6 min walk test and THRR while considering  patient's activity barriers and safety.   Exercise Prescription Goal: Initial exercise prescription builds to 30-45 minutes a day of aerobic activity, 2-3 days per week.  Home exercise guidelines will be given to patient during program as part of exercise prescription that the participant will acknowledge.  Activity Barriers & Risk Stratification: Activity Barriers & Cardiac Risk Stratification - 04/03/19 1218      Activity Barriers & Cardiac Risk Stratification   Activity Barriers  Deconditioning;Muscular Weakness;Shortness of Breath;History of Falls       6 Minute Walk: 6 Minute Walk    Row Name 04/09/19 1504         6 Minute Walk   Phase  Initial     Distance  1200 feet     Walk Time  6 minutes     # of Rest Breaks  0     MPH  2.27     METS  3.37     RPE  17     Perceived Dyspnea   3     VO2 Peak  11.81     Symptoms  No     Resting HR  79 bpm     Resting BP  136/84     Resting Oxygen Saturation   97 %     Exercise Oxygen Saturation  during 6 min walk  94 %     Max Ex. HR  105 bpm     Max Ex. BP  164/82     2 Minute Post BP  138/86       Interval HR   1 Minute HR  92     2 Minute HR  100     3 Minute HR  103     4 Minute HR  105     5 Minute HR  104     6 Minute HR  103     2 Minute Post HR  86     Interval Heart Rate?  Yes       Interval Oxygen   Interval Oxygen?  Yes     Baseline Oxygen Saturation %  97 %     1 Minute Oxygen Saturation %  98 %     1 Minute Liters  of Oxygen  0 L     2 Minute Oxygen Saturation %  94 %     2 Minute Liters of Oxygen  0 L     3 Minute Oxygen Saturation %  95 %     3 Minute Liters of Oxygen  0 L     4 Minute Oxygen Saturation %  95 %     4 Minute Liters of Oxygen  0 L     5 Minute Oxygen Saturation %  95 %     5 Minute Liters of Oxygen  0 L     6 Minute Oxygen Saturation %  96 %     6 Minute Liters of Oxygen  0 L     2 Minute Post Oxygen Saturation %  99 %     2 Minute Post Liters of Oxygen  0 L       Oxygen  Initial Assessment: Oxygen Initial Assessment - 04/03/19 1221      Home Oxygen   Home Oxygen Device  None    Sleep Oxygen Prescription  CPAP    Home Exercise Oxygen Prescription  None    Home at Rest Exercise Oxygen Prescription  None    Compliance with Home Oxygen Use  Yes      Initial 6 min Walk   Oxygen Used  None      Program Oxygen Prescription   Program Oxygen Prescription  None      Intervention   Short Term Goals  To learn and exhibit compliance with exercise, home and travel O2 prescription;To learn and understand importance of monitoring SPO2 with pulse oximeter and demonstrate accurate use of the pulse oximeter.;To learn and understand importance of maintaining oxygen saturations>88%;To learn and demonstrate proper pursed lip breathing techniques or other breathing techniques.;To learn and demonstrate proper use of respiratory medications    Long  Term Goals  Exhibits compliance with exercise, home and travel O2 prescription;Verbalizes importance of monitoring SPO2 with pulse oximeter and return demonstration;Maintenance of O2 saturations>88%;Exhibits proper breathing techniques, such as pursed lip breathing or other method taught during program session;Compliance with respiratory medication;Demonstrates proper use of MDI's       Oxygen Re-Evaluation: Oxygen Re-Evaluation    Row Name 10/29/18 1537 04/10/19 1107           Program Oxygen Prescription   Program Oxygen Prescription  None   None        Home Oxygen   Home Oxygen Device  None  None      Sleep Oxygen Prescription  None  CPAP      Home Exercise Oxygen Prescription  None  None      Home at Rest Exercise Oxygen Prescription  None  None      Compliance with Home Oxygen Use  --  Yes        Goals/Expected Outcomes   Short Term Goals  To learn and exhibit compliance with exercise, home and travel O2 prescription;To learn and understand importance of monitoring SPO2 with pulse oximeter and demonstrate accurate use of the pulse oximeter.;To learn and understand importance of maintaining oxygen saturations>88%;To learn and demonstrate proper pursed lip breathing techniques or other breathing techniques.;To learn and demonstrate proper use of respiratory medications  To learn and exhibit compliance with exercise, home and travel O2 prescription;To learn and understand importance of monitoring SPO2 with pulse oximeter and demonstrate accurate use of the pulse oximeter.;To learn and understand importance of maintaining oxygen saturations>88%;To learn and demonstrate proper pursed lip breathing techniques or other breathing techniques.;To learn and demonstrate proper use of respiratory medications      Long  Term Goals  Exhibits compliance with exercise, home and travel O2 prescription;Verbalizes importance of monitoring SPO2 with pulse oximeter and return demonstration;Maintenance of O2 saturations>88%;Exhibits proper breathing techniques, such as pursed lip breathing or other method taught during program session;Compliance with respiratory medication;Demonstrates proper use of MDI's  Exhibits compliance with exercise, home and travel O2 prescription;Verbalizes importance of monitoring SPO2 with pulse oximeter and return demonstration;Maintenance of O2 saturations>88%;Exhibits proper breathing techniques, such as pursed lip breathing or other method taught during program session;Compliance with respiratory medication;Demonstrates proper  use of MDI's      Comments  She uses her inhaler as needed.  She is doing a sleep study tonight.  She has been using her PLB at home and finds it helpful.  She will keep [place] updated on her  sleep study.  Reviewed PLB technique with pt.  Talked about how it works and it's importance in maintaining their exercise saturations.      Goals/Expected Outcomes  Short: Continue to use PLB.  Long: Continued compliance.  Short: Become more profiecient at using PLB.   Long: Become independent at using PLB.         Oxygen Discharge (Final Oxygen Re-Evaluation): Oxygen Re-Evaluation - 04/10/19 1107      Program Oxygen Prescription   Program Oxygen Prescription  None      Home Oxygen   Home Oxygen Device  None    Sleep Oxygen Prescription  CPAP    Home Exercise Oxygen Prescription  None    Home at Rest Exercise Oxygen Prescription  None    Compliance with Home Oxygen Use  Yes      Goals/Expected Outcomes   Short Term Goals  To learn and exhibit compliance with exercise, home and travel O2 prescription;To learn and understand importance of monitoring SPO2 with pulse oximeter and demonstrate accurate use of the pulse oximeter.;To learn and understand importance of maintaining oxygen saturations>88%;To learn and demonstrate proper pursed lip breathing techniques or other breathing techniques.;To learn and demonstrate proper use of respiratory medications    Long  Term Goals  Exhibits compliance with exercise, home and travel O2 prescription;Verbalizes importance of monitoring SPO2 with pulse oximeter and return demonstration;Maintenance of O2 saturations>88%;Exhibits proper breathing techniques, such as pursed lip breathing or other method taught during program session;Compliance with respiratory medication;Demonstrates proper use of MDI's    Comments  Reviewed PLB technique with pt.  Talked about how it works and it's importance in maintaining their exercise saturations.    Goals/Expected Outcomes  Short:  Become more profiecient at using PLB.   Long: Become independent at using PLB.       Initial Exercise Prescription: Initial Exercise Prescription - 04/09/19 1500      Treadmill   MPH  2.2    Grade  1.5    Minutes  15    METs  3.1      Recumbant Bike   Level  2    RPM  50    Watts  45    Minutes  15    METs  3.1      Arm Ergometer   Level  1    RPM  30    Minutes  15    METs  2.5      Elliptical   Level  1    Speed  3    Minutes  15      Intensity   THRR 40-80% of Max Heartrate  116-153    Ratings of Perceived Exertion  11-15    Perceived Dyspnea  0-4      Resistance Training   Training Prescription  Yes    Weight  3 lb    Reps  10-15       Perform Capillary Blood Glucose checks as needed.  Exercise Prescription Changes: Exercise Prescription Changes    Row Name 10/29/18 1600 11/14/18 1000 04/11/19 1200         Response to Exercise   Blood Pressure (Admit)  --  134/56  130/78     Blood Pressure (Exercise)  --  138/70  170/82     Blood Pressure (Exit)  --  118/70  116/84     Heart Rate (Admit)  --  78 bpm  72 bpm     Heart Rate (Exercise)  --  134 bpm  101 bpm     Heart Rate (Exit)  --  95 bpm  95 bpm     Oxygen Saturation (Admit)  --  95 %  98 %     Oxygen Saturation (Exercise)  --  94 %  95 %     Oxygen Saturation (Exit)  --  93 %  96 %     Rating of Perceived Exertion (Exercise)  --  15  16     Perceived Dyspnea (Exercise)  --  1  3     Symptoms  --  none  none     Comments  --  ellipitcal is tough  --     Duration  --  Continue with 30 min of aerobic exercise without signs/symptoms of physical distress.  Continue with 30 min of aerobic exercise without signs/symptoms of physical distress.     Intensity  --  THRR unchanged  THRR unchanged       Progression   Progression  --  Continue to progress workloads to maintain intensity without signs/symptoms of physical distress.  --     Average METs  --  3.2  --       Resistance Training   Training  Prescription  --  Yes  Yes     Weight  --  3 lbs  3 lb     Reps  --  10-15  10-15       Interval Training   Interval Training  --  No  No       Treadmill   MPH  --  2.5  --     Grade  --  2  --     Minutes  --  15  --     METs  --  3.6  --       Recumbant Bike   Level  --  3  2     RPM  --  --  50     Watts  --  29  15     Minutes  --  15  15     METs  --  3.05  2.47       Arm Ergometer   Level  --  --  1     RPM  --  --  30     Minutes  --  --  15     METs  --  --  2.8       Elliptical   Level  --  1  --     Speed  --  3  --     Minutes  --  15  --       Biostep-RELP   Level  --  3  --     Minutes  --  15  --     METs  --  3  --       Home Exercise Plan   Plans to continue exercise at  Home (comment) walking, videos  Home (comment) walking, videos  --     Frequency  Add 2 additional days to program exercise sessions.  Add 2 additional days to program exercise sessions.  --     Initial Home Exercises Provided  10/29/18  10/29/18  --        Exercise Comments:   Exercise Goals and Review: Exercise Goals    Row Name 04/09/19 [phone]  Exercise Goals   Increase Physical Activity  Yes       Intervention  Provide advice, education, support and counseling about physical activity/exercise needs.;Develop an individualized exercise prescription for aerobic and resistive training based on initial evaluation findings, risk stratification, comorbidities and participant's personal goals.       Expected Outcomes  Short Term: Attend rehab on a regular basis to increase amount of physical activity.;Long Term: Add in home exercise to make exercise part of routine and to increase amount of physical activity.;Long Term: Exercising regularly at least 3-5 days a week.       Increase Strength and Stamina  Yes       Intervention  Provide advice, education, support and counseling about physical activity/exercise needs.;Develop an individualized exercise prescription for  aerobic and resistive training based on initial evaluation findings, risk stratification, comorbidities and participant's personal goals.       Expected Outcomes  Short Term: Increase workloads from initial exercise prescription for resistance, speed, and METs.;Short Term: Perform resistance training exercises routinely during rehab and add in resistance training at home;Long Term: Improve cardiorespiratory fitness, muscular endurance and strength as measured by increased METs and functional capacity (6MWT)       Able to understand and use rate of perceived exertion (RPE) scale  Yes       Intervention  Provide education and explanation on how to use RPE scale       Expected Outcomes  Short Term: Able to use RPE daily in rehab to express subjective intensity level;Long Term:  Able to use RPE to guide intensity level when exercising independently       Able to understand and use Dyspnea scale  Yes       Intervention  Provide education and explanation on how to use Dyspnea scale       Expected Outcomes  Short Term: Able to use Dyspnea scale daily in rehab to express subjective sense of shortness of breath during exertion;Long Term: Able to use Dyspnea scale to guide intensity level when exercising independently       Knowledge and understanding of Target Heart Rate Range (THRR)  Yes       Intervention  Provide education and explanation of THRR including how the numbers were predicted and where they are located for reference       Expected Outcomes  Short Term: Able to state/look up THRR;Short Term: Able to use daily as guideline for intensity in rehab;Long Term: Able to use THRR to govern intensity when exercising independently       Able to check pulse independently  Yes       Intervention  Provide education and demonstration on how to check pulse in carotid and radial arteries.;Review the importance of being able to check your own pulse for safety during independent exercise       Expected Outcomes   Short Term: Able to explain why pulse checking is important during independent exercise;Long Term: Able to check pulse independently and accurately       Understanding of Exercise Prescription  Yes       Intervention  Provide education, explanation, and written materials on patient's individual exercise prescription       Expected Outcomes  Short Term: Able to explain program exercise prescription;Long Term: Able to explain home exercise prescription to exercise independently          Exercise Goals Re-Evaluation : Exercise Goals Re-Evaluation    Row Name 10/29/18 1606 11/13/18 1541 04/10/19 1105  04/11/19 1300       Exercise Goal Re-Evaluation   Exercise Goals Review  Increase Physical Activity;Able to understand and use rate of perceived exertion (RPE) scale;Knowledge and understanding of Target Heart Rate Range (THRR);Understanding of Exercise Prescription;Increase Strength and Stamina;Able to understand and use Dyspnea scale;Able to check pulse independently  Increase Physical Activity;Increase Strength and Stamina;Understanding of Exercise Prescription  Increase Physical Activity;Increase Strength and Stamina;Understanding of Exercise Prescription;Able to understand and use Dyspnea scale;Able to understand and use rate of perceived exertion (RPE) scale;Knowledge and understanding of Target Heart Rate Range (THRR)  Increase Physical Activity;Increase Strength and Stamina;Able to understand and use rate of perceived exertion (RPE) scale;Able to understand and use Dyspnea scale;Knowledge and understanding of Target Heart Rate Range (THRR);Able to check pulse independently    Comments  [name] is off to a good start in rehab.  She wants to go ahead and start walking more on her off days.  Reviewed home exercise with pt today.  Pt plans to walk and use staff videos at home for exercise.  Reviewed THR, pulse, RPE, sign and symptoms, and when to call 911 or MD.  Also discussed weather considerations and  indoor options.  Pt voiced understanding.  [name] has been doing well in rehab.  She is working hard to [employer], [employer] the elliptical and is determined to get to her full 15 min!  We will continue to monitor her progression.  Reviewed RPE scale, THR and program prescription with pt today.  Pt voiced understanding and was given a copy of goals to take home.  --    Expected Outcomes  Short: Add in exercise on off days from rehab.  Long: Continue to improve stamina.  Short: Return to rehab regularly  Long: Continue to improve stamina and exercise for  mental boost  Short: Use RPE daily to regulate intensity. Long: Follow program prescription in [place].  --       Discharge Exercise Prescription (Final Exercise Prescription Changes): Exercise Prescription Changes - 04/11/19 1200      Response to Exercise   Blood Pressure (Admit)  130/78    Blood Pressure (Exercise)  170/82    Blood Pressure (Exit)  116/84    Heart Rate (Admit)  72 bpm    Heart Rate (Exercise)  101 bpm    Heart Rate (Exit)  95 bpm    Oxygen Saturation (Admit)  98 %    Oxygen Saturation (Exercise)  95 %    Oxygen Saturation (Exit)  96 %    Rating of Perceived Exertion (Exercise)  16    Perceived Dyspnea (Exercise)  3    Symptoms  none    Duration  Continue with 30 min of aerobic exercise without signs/symptoms of physical distress.    Intensity  THRR unchanged      Resistance Training   Training Prescription  Yes    Weight  3 lb    Reps  10-15      Interval Training   Interval Training  No      Recumbant Bike   Level  2    RPM  50    Watts  15    Minutes  15    METs  2.47      Arm Ergometer   Level  1    RPM  30    Minutes  15    METs  2.8       Nutrition:  Target Goals: Understanding of nutrition guidelines, daily intake of  sodium '1500mg'$ , cholesterol '200mg'$ , calories 30% from fat and 7% or less from saturated fats, daily to have 5 or more servings of fruits and vegetables.  Biometrics:    Nutrition Therapy  Plan and Nutrition Goals:   Nutrition Assessments:   Nutrition Goals Re-Evaluation:   Nutrition Goals Discharge (Final Nutrition Goals Re-Evaluation):   Psychosocial: Target Goals: Acknowledge presence or absence of significant depression and/or stress, maximize coping skills, provide positive support system. Participant is able to verbalize types and ability to use techniques and skills needed for reducing stress and depression.   Initial Review & Psychosocial Screening: Initial Psych Review & Screening - 04/03/19 1219      Initial Review   Current issues with  Current Psychotropic Meds;Current Sleep Concerns;Current Depression;Current Stress Concerns    Source of Stress Concerns  Chronic Illness;Family;Poor Coping Skills;Unable to participate in former interests or hobbies;Unable to perform yard/household activities    Comments  She stresses about many things.  She has been caring for her aunt over the past year.  She has lost several family members to suicide and murder. She wants to breathe better to be able to do more at home.      Family Dynamics   Good Support System?  Yes   Husband, aunt, son   Comments  Recent loss of family and friends (4 in the last two months)      Barriers   Psychosocial barriers to participate in program  The patient should benefit from training in stress management and relaxation.;Psychosocial barriers identified (see note)      Screening Interventions   Interventions  Encouraged to exercise;To provide support and resources with identified psychosocial needs;Provide feedback about the scores to participant    Expected Outcomes  Short Term goal: Utilizing psychosocial counselor, staff and physician to assist with identification of specific Stressors or current issues interfering with healing process. Setting desired goal for each stressor or current issue identified.;Long Term Goal: Stressors or current issues are controlled or eliminated.;Short Term  goal: Identification and review with participant of any Quality of Life or Depression concerns found by scoring the questionnaire.;Long Term goal: The participant improves quality of Life and PHQ9 Scores as seen by post scores and/or verbalization of changes       Quality of Life Scores:  Scores of 19 and below usually indicate a poorer quality of life in these areas.  A difference of  2-3 points is a clinically meaningful difference.  A difference of 2-3 points in the total score of the Quality of Life Index has been associated with significant improvement in overall quality of life, self-image, physical symptoms, and general health in studies assessing change in quality of life.  PHQ-9: Recent Review Flowsheet Data    Depression screen [facility] 2/9 04/09/2019 10/29/2018   Decreased Interest 3 3   Down, Depressed, Hopeless 3 3   PHQ - 2 Score 6 6   Altered sleeping 3 1   Tired, decreased energy 3 3   Change in appetite 3 3   Feeling bad or failure about yourself  2 3   Trouble concentrating 3 3   Moving slowly or fidgety/restless 1 2   Suicidal thoughts 0 0   PHQ-9 Score 21 21   Difficult doing work/chores - Very difficult     Interpretation of Total Score  Total Score Depression Severity:  1-4 = Minimal depression, 5-9 = Mild depression, 10-14 = Moderate depression, 15-19 = Moderately severe depression, 20-27 = Severe depression  Psychosocial Evaluation and Intervention: Psychosocial Evaluation - 04/03/19 1226      Psychosocial Evaluation & Interventions   Interventions  Stress management education;Encouraged to exercise with the program and follow exercise prescription    Comments  Selinda Eon is coming back to rehab after taking time off to help care for her aunt over the past year.  Seh has had a rough few months after losing family to suicide and murder.  She has a significant history of depression and anxiety but feels that her meds are helping to keep it manageable for her.  She really  is looking forward to returning to rehab again! So much so that she turned up in person for her phone call today!  She wants to work on her breathing and lose the weight she has gained over the last few months.    Expected Outcomes  Short: Attend rehab regularly and use exerise as a stress outlet.  Long: Work on weight loss and breathing to be more functional.    Continue Psychosocial Services   Follow up required by staff       Psychosocial Re-Evaluation: Psychosocial Re-Evaluation    [place] Name 10/29/18 1529 11/13/18 1542           Psychosocial Re-Evaluation   Current issues with  Current Stress Concerns;Current Depression;Current Psychotropic Meds  Current Sleep Concerns      Comments  [name] is doing well in rehab. She continues to feel sad all the time, but does note that it is better on days she exercises, so she is going to try to exercise a little more often.  She feels that she puts on a happy face.  She is taking lexapro and wellbutrin.  The lexapro is newer.  The wellbutrin has been more long term and we talked about maybe talking with her doctor about uping her dosage. Completed PHQ9 questionnarie today.  She uses medications to sleep and is completing a sleep study tonight at home.  [name] completed her sleep study and has been diagnosed with sleep apnea.  She will start using a CPAP.      Expected Outcomes  Short: Talk to doctor about meds.  Long: Continue work on improving depression.  Short: CPAP compliance.  Long: Improved sleep quality.      Interventions  Stress management education;Encouraged to attend Pulmonary Rehabilitation for the exercise  --      Continue Psychosocial Services   Follow up required by staff  Follow up required by staff      Comments  Stress is part of her nature  --         Psychosocial Discharge (Final Psychosocial Re-Evaluation): Psychosocial Re-Evaluation - 11/13/18 1542      Psychosocial Re-Evaluation   Current issues with  Current Sleep  Concerns    Comments  [name] completed her sleep study and has been diagnosed with sleep apnea.  She will start using a CPAP.    Expected Outcomes  Short: CPAP compliance.  Long: Improved sleep quality.    Continue Psychosocial Services   Follow up required by staff       Education: Education Goals: Education classes will be provided on a weekly basis, covering required topics. Participant will state understanding/return demonstration of topics presented.  Learning Barriers/Preferences: Learning Barriers/Preferences - 04/03/19 1219      Learning Barriers/Preferences   Learning Barriers  Hearing    Learning Preferences  None       Education Topics:  Initial Evaluation Education: - Verbal,  written and demonstration of respiratory meds, oximetry and breathing techniques. Instruction on use of nebulizers and MDIs and importance of monitoring MDI activations.   General Nutrition Guidelines/Fats and Fiber: -Group instruction provided by verbal, written material, models and posters to present the general guidelines for heart healthy nutrition. Gives an explanation and review of dietary fats and fiber.   Controlling Sodium/Reading Food Labels: -Group verbal and written material supporting the discussion of sodium use in heart healthy nutrition. Review and explanation with models, verbal and written materials for utilization of the food label.   Exercise Physiology & General Exercise Guidelines: - Group verbal and written instruction with models to review the exercise physiology of the cardiovascular system and associated critical values. Provides general exercise guidelines with specific guidelines to those with heart or lung disease.    Aerobic Exercise & Resistance Training: - Gives group verbal and written instruction on the various components of exercise. Focuses on aerobic and resistive training programs and the benefits of this training and how to safely progress through these  programs.   Flexibility, Balance, Mind/Body Relaxation: Provides group verbal/written instruction on the benefits of flexibility and balance training, including mind/body exercise modes such as yoga, pilates and tai chi.  Demonstration and skill practice provided.   Stress and Anxiety: - Provides group verbal and written instruction about the health risks of elevated stress and causes of high stress.  Discuss the correlation between heart/lung disease and anxiety and treatment options. Review healthy ways to manage with stress and anxiety.   Depression: - Provides group verbal and written instruction on the correlation between heart/lung disease and depressed mood, treatment options, and the stigmas associated with seeking treatment.   Exercise & Equipment Safety: - Individual verbal instruction and demonstration of equipment use and safety with use of the equipment.   Pulmonary Rehab from 10/11/2018 in [facility] Cardiac and Pulmonary Rehab  Date  10/11/18  Educator  AS  Instruction Review Code  1- Verbalizes Understanding      Infection Prevention: - Provides verbal and written material to individual with discussion of infection control including proper hand washing and proper equipment cleaning during exercise session.   Pulmonary Rehab from 10/11/2018 in [facility] Cardiac and Pulmonary Rehab  Date  10/11/18  Educator  AS  Instruction Review Code  1- Verbalizes Understanding      Falls Prevention: - Provides verbal and written material to individual with discussion of falls prevention and safety.   Pulmonary Rehab from 10/11/2018 in [facility] Cardiac and Pulmonary Rehab  Date  10/11/18  Educator  AS  Instruction Review Code  1- Verbalizes Understanding      Diabetes: - Individual verbal and written instruction to review signs/symptoms of diabetes, desired ranges of glucose level fasting, after meals and with exercise. Advice that pre and post exercise glucose checks will be done for 3  sessions at entry of program.   Chronic Lung Diseases: - Group verbal and written instruction to review updates, respiratory medications, advancements in procedures and treatments. Discuss use of supplemental oxygen including available portable oxygen systems, continuous and intermittent flow rates, concentrators, personal use and safety guidelines. Review proper use of inhaler and spacers. Provide informative websites for self-education.    Energy Conservation: - Provide group verbal and written instruction for methods to conserve energy, plan and organize activities. Instruct on pacing techniques, use of adaptive equipment and posture/positioning to relieve shortness of breath.   Triggers and Exacerbations: - Group verbal and written instruction to review types of  environmental triggers and ways to prevent exacerbations. Discuss weather changes, air quality and the benefits of nasal washing. Review warning signs and symptoms to help prevent infections. Discuss techniques for effective airway clearance, coughing, and vibrations.   AED/CPR: - Group verbal and written instruction with the use of models to demonstrate the basic use of the AED with the basic ABC's of resuscitation.   Anatomy and Physiology of the Lungs: - Group verbal and written instruction with the use of models to provide basic lung anatomy and physiology related to function, structure and complications of lung disease.   Anatomy & Physiology of the Heart: - Group verbal and written instruction and models provide basic cardiac anatomy and physiology, with the coronary electrical and arterial systems. Review of Valvular disease and Heart Failure   Cardiac Medications: - Group verbal and written instruction to review commonly prescribed medications for heart disease. Reviews the medication, class of the drug, and side effects.   Know Your Numbers and Risk Factors: -Group verbal and written instruction about important  numbers in your health.  Discussion of what are risk factors and how they play a role in the disease process.  Review of Cholesterol, Blood Pressure, Diabetes, and BMI and the role they play in your overall health.   Sleep Hygiene: -Provides group verbal and written instruction about how sleep can affect your health.  Define sleep hygiene, discuss sleep cycles and impact of sleep habits. Review good sleep hygiene tips.    Other: -Provides group and verbal instruction on various topics (see comments)    Knowledge Questionnaire Score: Knowledge Questionnaire Score - 04/09/19 1518      Knowledge Questionnaire Score   Pre Score  15/18        Core Components/Risk Factors/Patient Goals at Admission: Personal Goals and Risk Factors at Admission - 04/09/19 1502      Core Components/Risk Factors/Patient Goals on Admission   Intervention  Weight Management: Develop a combined nutrition and exercise program designed to reach desired caloric intake, while maintaining appropriate intake of nutrient and fiber, sodium and fats, and appropriate energy expenditure required for the weight goal.;Weight Management: Provide education and appropriate resources to help participant work on and attain dietary goals.;Weight Management/Obesity: Establish reasonable short term and long term weight goals.;Obesity: Provide education and appropriate resources to help participant work on and attain dietary goals.    Admit Weight  226 lb (102.5 kg)    Goal Weight: Short Term  215 lb (97.5 kg)    Goal Weight: Long Term  200 lb (90.7 kg)    Expected Outcomes  Short Term: Continue to assess and modify interventions until short term weight is achieved;Long Term: Adherence to nutrition and physical activity/exercise program aimed toward attainment of established weight goal;Weight Loss: Understanding of general recommendations for a balanced deficit meal plan, which promotes 1-2 lb weight loss per week and includes a negative  energy balance of [phone] kcal/d;Understanding recommendations for meals to include 15-35% energy as protein, 25-35% energy from fat, 35-60% energy from carbohydrates, less than '200mg'$  of dietary cholesterol, 20-35 gm of total fiber daily;Understanding of distribution of calorie intake throughout the day with the consumption of 4-5 meals/snacks    Intervention  Provide education on lifestyle modifcations including regular physical activity/exercise, weight management, moderate sodium restriction and increased consumption of fresh fruit, vegetables, and low fat dairy, alcohol moderation, and smoking cessation.;Monitor prescription use compliance.    Expected Outcomes  Short Term: Continued assessment and intervention until BP is < 140/67m  HG in hypertensive participants. < 130/18m HG in hypertensive participants with diabetes, heart failure or chronic kidney disease.;Long Term: Maintenance of blood pressure at goal levels.       Core Components/Risk Factors/Patient Goals Review:  Goals and Risk Factor Review    Row Name 10/29/18 1535             Core Components/Risk Factors/Patient Goals Review   Personal Goals Review  Weight Management/Obesity;Hypertension;Lipids       Review  MSharyn Lullis doing well in weight.  Today she was up 2lbs but has started her cycle this week too, but no increased SOB.  Blood pressures are doing good, but she does not check at home as she does not have a cuff.  If she is out at a pharmacy she will get it checked.  She is doing well with her medications.       Expected Outcomes  Short: Continue to work on weight loss.  Long: Continue to montior risk factors.          Core Components/Risk Factors/Patient Goals at Discharge (Final Review):  Goals and Risk Factor Review - 10/29/18 1535      Core Components/Risk Factors/Patient Goals Review   Personal Goals Review  Weight Management/Obesity;Hypertension;Lipids    Review  MSharyn Lullis doing well in weight.  Today she  was up 2lbs but has started her cycle this week too, but no increased SOB.  Blood pressures are doing good, but she does not check at home as she does not have a cuff.  If she is out at a pharmacy she will get it checked.  She is doing well with her medications.    Expected Outcomes  Short: Continue to work on weight loss.  Long: Continue to montior risk factors.       ITP Comments: ITP Comments    Row Name 11/07/18 1240 12/03/18 1419 12/04/18 1151 12/05/18 0615 01/01/19 1144   ITP Comments  30 day review completed. ITP sent to Dr. MEmily Filbert Medical Director of Cardiac and Pulmonary Rehab. Continue with ITP unless changes are made by physician.  Department closed starting 10/2 until further notice by infection prevention and Health at Work teams for [place]  MSharyn Lullcalled out today. She has not been able to be here this month due to caring for her aunt.  Unable to complete nutrition 30-day re-eval cycle due to inconsistent attendence  30 day review completed. Continue with ITP sent to Dr. MEmily Filbert Medical Director of Cardiac and Pulmonary Rehab for review , changes as needed and signature.  Last visit 9/30  Calls have been made  Called to check on pt.  Out since 10/31/18 caring for her aunt.  Left message.  Sent message on MyChart.   [place] 01/01/19 1429 04/03/19 1240 04/10/19 1104 04/24/19 0553     ITP Comments  MSharyn Lullreturned our message.  She would like to discharge at this time.  Completed virtual orientation today.  EP evaluation is scheduled for Tuesday 3/9 at 2pm.  [facility]  She will restart on session number 8.  First full day of exercise!  Patient was oriented to gym and equipment including functions, settings, policies, and procedures.  Patient's individual exercise prescription and treatment plan were reviewed.  All starting workloads were established based on the results of the 6 minute walk test done at initial  orientation visit.  The plan for exercise progression was also introduced and  progression will be customized based on patient's performance and goals.  30 day chart review completed. ITP sent to Dr Zachery Dakins Medical Director, for review,changes as needed and signature. Continue with ITP if no changes requested    New to program       Comments:

## 2019-04-24 NOTE — Telephone Encounter (Signed)
Called to check in with pt as pt was not feeling well last week and was getting tested for COVID. LM. Latest note from 2/17 suggests COVID test and self isolation.

## 2019-05-01 ENCOUNTER — Encounter: Payer: BC Managed Care – PPO | Admitting: *Deleted

## 2019-05-01 ENCOUNTER — Other Ambulatory Visit: Payer: Self-pay

## 2019-05-01 DIAGNOSIS — F419 Anxiety disorder, unspecified: Secondary | ICD-10-CM | POA: Diagnosis not present

## 2019-05-01 DIAGNOSIS — I10 Essential (primary) hypertension: Secondary | ICD-10-CM | POA: Diagnosis not present

## 2019-05-01 DIAGNOSIS — Z7982 Long term (current) use of aspirin: Secondary | ICD-10-CM | POA: Diagnosis not present

## 2019-05-01 DIAGNOSIS — R0609 Other forms of dyspnea: Secondary | ICD-10-CM

## 2019-05-01 DIAGNOSIS — F329 Major depressive disorder, single episode, unspecified: Secondary | ICD-10-CM | POA: Diagnosis not present

## 2019-05-01 DIAGNOSIS — R0600 Dyspnea, unspecified: Secondary | ICD-10-CM | POA: Diagnosis not present

## 2019-05-01 DIAGNOSIS — Z79899 Other long term (current) drug therapy: Secondary | ICD-10-CM | POA: Diagnosis not present

## 2019-05-01 DIAGNOSIS — E785 Hyperlipidemia, unspecified: Secondary | ICD-10-CM | POA: Diagnosis not present

## 2019-05-01 NOTE — Progress Notes (Signed)
Daily Session Note  Patient Details  Name: Samantha Greene MRN: [id number] Date of Birth: Dec 19, 1970 Referring Provider:     Pulmonary Rehab from 10/11/2018 in [facility] Cardiac and Pulmonary Rehab  Referring Provider  [place]      Encounter Date: 05/01/2019  Check In: Session Check In - 05/01/19 1041      Check-In   Supervising physician immediately available to respond to emergencies  See telemetry face sheet for immediately available ER MD    Location  [facility]-Cardiac & Pulmonary Rehab    Staff Present  [name], [name];Joseph Hood RCP,RRT,BSRT;Melissa Harmony RDN, LDN    Virtual Visit  No    Medication changes reported      No    Fall or balance concerns reported     No    Warm-up and Cool-down  Performed on first and last piece of equipment    Resistance Training Performed  Yes    VAD Patient?  No    PAD/SET Patient?  No      Pain Assessment   Currently in Pain?  No/denies          Social History   Tobacco Use  Smoking Status Never Smoker  Smokeless Tobacco Never Used    Goals Met:  Independence with exercise equipment Exercise tolerated well No report of cardiac concerns or symptoms Strength training completed today  Goals Unmet:  Not Applicable  Comments: Pt able to follow exercise prescription today without complaint.  Will continue to monitor for progression.    Dr. Emily Filbert is Medical Director for [place] and [facility].

## 2019-05-02 ENCOUNTER — Encounter: Payer: Self-pay | Admitting: Licensed Clinical Social Worker

## 2019-05-02 NOTE — Progress Notes (Signed)
  UPDATE: VUS called [facility] ([id number])  Has been reclassified to "Likely Benign." The report date is 05/02/2019.

## 2019-05-03 ENCOUNTER — Encounter: Payer: BC Managed Care – PPO | Attending: Internal Medicine | Admitting: *Deleted

## 2019-05-03 ENCOUNTER — Other Ambulatory Visit: Payer: Self-pay

## 2019-05-03 ENCOUNTER — Ambulatory Visit: Payer: BC Managed Care – PPO | Attending: Internal Medicine

## 2019-05-03 DIAGNOSIS — Z7982 Long term (current) use of aspirin: Secondary | ICD-10-CM | POA: Diagnosis not present

## 2019-05-03 DIAGNOSIS — F329 Major depressive disorder, single episode, unspecified: Secondary | ICD-10-CM | POA: Diagnosis not present

## 2019-05-03 DIAGNOSIS — R0600 Dyspnea, unspecified: Secondary | ICD-10-CM | POA: Diagnosis not present

## 2019-05-03 DIAGNOSIS — I10 Essential (primary) hypertension: Secondary | ICD-10-CM | POA: Diagnosis not present

## 2019-05-03 DIAGNOSIS — Z23 Encounter for immunization: Secondary | ICD-10-CM

## 2019-05-03 DIAGNOSIS — Z79899 Other long term (current) drug therapy: Secondary | ICD-10-CM | POA: Insufficient documentation

## 2019-05-03 DIAGNOSIS — R0609 Other forms of dyspnea: Secondary | ICD-10-CM

## 2019-05-03 DIAGNOSIS — F419 Anxiety disorder, unspecified: Secondary | ICD-10-CM | POA: Diagnosis not present

## 2019-05-03 DIAGNOSIS — E785 Hyperlipidemia, unspecified: Secondary | ICD-10-CM | POA: Insufficient documentation

## 2019-05-03 NOTE — Progress Notes (Signed)
   Covid-19 Vaccination Clinic  Name:  Samantha Greene    MRN: [id number] DOB: August 07, 1970  05/03/2019  Ms. [name] was observed post Covid-19 immunization for 15 minutes without incident. She was provided with Vaccine Information Sheet and instruction to access the V-Safe system.   Ms. [name] was instructed to call 911 with any severe reactions post vaccine: Marland Kitchen Difficulty breathing  . Swelling of face and throat  . A fast heartbeat  . A bad rash all over body  . Dizziness and weakness   Immunizations Administered    Name Date Dose VIS Date Route   Pfizer COVID-19 Vaccine 05/03/2019  1:13 PM 0.3 mL 01/11/2019 Intramuscular   Manufacturer: [employer], [employer]   Lot: [id number]   NDC: [address]-2

## 2019-05-03 NOTE — Progress Notes (Signed)
Daily Session Note  Patient Details  Name: Samantha Greene MRN: [id number] Date of Birth: [birth year] Referring Provider:     Pulmonary Rehab from 10/11/2018 in [facility] Cardiac and Pulmonary Rehab  Referring Provider  [place]      Encounter Date: 05/03/2019  Check In: Session Check In - 05/03/19 1010      Check-In   Supervising physician immediately available to respond to emergencies  See telemetry face sheet for immediately available ER MD    Location  [facility]-Cardiac & Pulmonary Rehab    Staff Present  Heath Lark, RN, BSN, CCRP;Meredith Sherryll Burger, [name];Joseph Hood RCP,RRT,BSRT    Virtual Visit  No    Medication changes reported      No    Fall or balance concerns reported     No    Warm-up and Cool-down  Performed on first and last piece of equipment    Resistance Training Performed  Yes    VAD Patient?  No    PAD/SET Patient?  No      Pain Assessment   Currently in Pain?  No/denies          Social History   Tobacco Use  Smoking Status Never Smoker  Smokeless Tobacco Never Used    Goals Met:  Independence with exercise equipment Exercise tolerated well No report of cardiac concerns or symptoms  Goals Unmet:  Not Applicable  Comments: Pt able to follow exercise prescription today without complaint.  Will continue to monitor for progression.    Dr. Emily Filbert is Medical Director for [place] and [facility].

## 2019-05-13 ENCOUNTER — Encounter: Payer: BC Managed Care – PPO | Admitting: *Deleted

## 2019-05-13 ENCOUNTER — Other Ambulatory Visit: Payer: Self-pay

## 2019-05-13 DIAGNOSIS — I10 Essential (primary) hypertension: Secondary | ICD-10-CM | POA: Diagnosis not present

## 2019-05-13 DIAGNOSIS — Z7982 Long term (current) use of aspirin: Secondary | ICD-10-CM | POA: Diagnosis not present

## 2019-05-13 DIAGNOSIS — R0600 Dyspnea, unspecified: Secondary | ICD-10-CM | POA: Diagnosis not present

## 2019-05-13 DIAGNOSIS — R0609 Other forms of dyspnea: Secondary | ICD-10-CM

## 2019-05-13 DIAGNOSIS — F329 Major depressive disorder, single episode, unspecified: Secondary | ICD-10-CM | POA: Diagnosis not present

## 2019-05-13 DIAGNOSIS — Z79899 Other long term (current) drug therapy: Secondary | ICD-10-CM | POA: Diagnosis not present

## 2019-05-13 DIAGNOSIS — F419 Anxiety disorder, unspecified: Secondary | ICD-10-CM | POA: Diagnosis not present

## 2019-05-13 DIAGNOSIS — E785 Hyperlipidemia, unspecified: Secondary | ICD-10-CM | POA: Diagnosis not present

## 2019-05-13 NOTE — Progress Notes (Signed)
Daily Session Note  Patient Details  Name: Samantha Greene MRN: [id number] Date of Birth: [birth year] Referring Provider:     Pulmonary Rehab from 10/11/2018 in [facility] Cardiac and Pulmonary Rehab  Referring Provider  [place]      Encounter Date: 05/13/2019  Check In: Session Check In - 05/13/19 1045      Check-In   Supervising physician immediately available to respond to emergencies  See telemetry face sheet for immediately available ER MD    Location  [facility]-Cardiac & Pulmonary Rehab    Staff Present  [name], [name];Joseph [address] [place], [place], RCEP, CCRP, CCET;Melissa [place] RDN, [place]    Virtual Visit  No    Medication changes reported      No    Fall or balance concerns reported     No    Warm-up and Cool-down  Performed on first and last piece of equipment    Resistance Training Performed  Yes    VAD Patient?  No    PAD/SET Patient?  No      Pain Assessment   Currently in Pain?  No/denies          Social History   Tobacco Use  Smoking Status Never Smoker  Smokeless Tobacco Never Used    Goals Met:  Proper associated with RPD/PD & O2 Sat Independence with exercise equipment Exercise tolerated well  Goals Unmet:  Not Applicable  Comments: Reviewed home exercise with pt today.  Pt plans to walk and use staff videos for exercise.  Reviewed THR, pulse, RPE, sign and symptoms, NTG use, and when to call 911 or MD.  Also discussed weather considerations and indoor options.  Pt voiced understanding.    Dr. Emily Filbert is Medical Director for [place] and [facility].

## 2019-05-14 ENCOUNTER — Other Ambulatory Visit: Payer: Self-pay | Admitting: Internal Medicine

## 2019-05-14 DIAGNOSIS — M5441 Lumbago with sciatica, right side: Secondary | ICD-10-CM

## 2019-05-15 ENCOUNTER — Other Ambulatory Visit: Payer: Self-pay

## 2019-05-15 ENCOUNTER — Encounter: Payer: BC Managed Care – PPO | Admitting: *Deleted

## 2019-05-15 DIAGNOSIS — R0600 Dyspnea, unspecified: Secondary | ICD-10-CM | POA: Diagnosis not present

## 2019-05-15 DIAGNOSIS — Z7982 Long term (current) use of aspirin: Secondary | ICD-10-CM | POA: Diagnosis not present

## 2019-05-15 DIAGNOSIS — R0609 Other forms of dyspnea: Secondary | ICD-10-CM

## 2019-05-15 DIAGNOSIS — F329 Major depressive disorder, single episode, unspecified: Secondary | ICD-10-CM | POA: Diagnosis not present

## 2019-05-15 DIAGNOSIS — I10 Essential (primary) hypertension: Secondary | ICD-10-CM | POA: Diagnosis not present

## 2019-05-15 DIAGNOSIS — Z79899 Other long term (current) drug therapy: Secondary | ICD-10-CM | POA: Diagnosis not present

## 2019-05-15 DIAGNOSIS — F419 Anxiety disorder, unspecified: Secondary | ICD-10-CM | POA: Diagnosis not present

## 2019-05-15 DIAGNOSIS — E785 Hyperlipidemia, unspecified: Secondary | ICD-10-CM | POA: Diagnosis not present

## 2019-05-15 NOTE — Progress Notes (Signed)
Daily Session Note  Patient Details  Name: Samantha Greene MRN: [id number] Date of Birth: 1970-12-26 Referring Provider:     Pulmonary Rehab from 10/11/2018 in [facility] Cardiac and Pulmonary Rehab  Referring Provider  [place]      Encounter Date: 05/15/2019  Check In: Session Check In - 05/15/19 1028      Check-In   Supervising physician immediately available to respond to emergencies  See telemetry face sheet for immediately available ER MD    Location  [facility]-Cardiac & Pulmonary Rehab    Staff Present  [name], [name];Joseph [address] [place], [place], ACSM CEP, Exercise Physiologist    Virtual Visit  No    Medication changes reported      No    Fall or balance concerns reported     Yes    Comments  Samantha Greene passed out Monday afternoon, she reported feeling better after and has not had any issues since. No injuries.    Warm-up and Cool-down  Performed on first and last piece of equipment    Resistance Training Performed  Yes    VAD Patient?  No    PAD/SET Patient?  No      Pain Assessment   Currently in Pain?  No/denies          Social History   Tobacco Use  Smoking Status Never Smoker  Smokeless Tobacco Never Used    Goals Met:  Independence with exercise equipment Exercise tolerated well No report of cardiac concerns or symptoms Strength training completed today  Goals Unmet:  Not Applicable  Comments: Pt able to follow exercise prescription today without complaint.  Will continue to monitor for progression.    Dr. Emily Filbert is Medical Director for [facility] and [facility].

## 2019-05-22 ENCOUNTER — Encounter: Payer: Self-pay | Admitting: *Deleted

## 2019-05-22 ENCOUNTER — Encounter: Payer: BC Managed Care – PPO | Admitting: *Deleted

## 2019-05-22 ENCOUNTER — Other Ambulatory Visit: Payer: Self-pay

## 2019-05-22 DIAGNOSIS — R0609 Other forms of dyspnea: Secondary | ICD-10-CM

## 2019-05-22 DIAGNOSIS — Z79899 Other long term (current) drug therapy: Secondary | ICD-10-CM | POA: Diagnosis not present

## 2019-05-22 DIAGNOSIS — F329 Major depressive disorder, single episode, unspecified: Secondary | ICD-10-CM | POA: Diagnosis not present

## 2019-05-22 DIAGNOSIS — E785 Hyperlipidemia, unspecified: Secondary | ICD-10-CM | POA: Diagnosis not present

## 2019-05-22 DIAGNOSIS — I10 Essential (primary) hypertension: Secondary | ICD-10-CM | POA: Diagnosis not present

## 2019-05-22 DIAGNOSIS — F419 Anxiety disorder, unspecified: Secondary | ICD-10-CM | POA: Diagnosis not present

## 2019-05-22 DIAGNOSIS — R0600 Dyspnea, unspecified: Secondary | ICD-10-CM | POA: Diagnosis not present

## 2019-05-22 DIAGNOSIS — Z7982 Long term (current) use of aspirin: Secondary | ICD-10-CM | POA: Diagnosis not present

## 2019-05-22 NOTE — Progress Notes (Signed)
Daily Session Note  Patient Details  Name: Samantha Greene MRN: [id number] Date of Birth: Mar 17, 1970 Referring Provider:     Pulmonary Rehab from 10/11/2018 in [facility] Cardiac and Pulmonary Rehab  Referring Provider  [place]      Encounter Date: 05/22/2019  Check In: Session Check In - 05/22/19 1018      Check-In   Supervising physician immediately available to respond to emergencies  See telemetry face sheet for immediately available ER MD    Location  [facility]-Cardiac & Pulmonary Rehab    Staff Present  [name], [name];Joseph [place], [place], ACSM CEP, Exercise Physiologist    Virtual Visit  No    Medication changes reported      No    Fall or balance concerns reported     No    Warm-up and Cool-down  Performed on first and last piece of equipment    Resistance Training Performed  Yes    VAD Patient?  No    PAD/SET Patient?  No      Pain Assessment   Currently in Pain?  No/denies          Social History   Tobacco Use  Smoking Status Never Smoker  Smokeless Tobacco Never Used    Goals Met:  Independence with exercise equipment Exercise tolerated well No report of cardiac concerns or symptoms Strength training completed today  Goals Unmet:  Not Applicable  Comments: Pt able to follow exercise prescription today without complaint.  Will continue to monitor for progression.    Dr. Emily Filbert is Medical Director for [facility].

## 2019-05-22 NOTE — Progress Notes (Signed)
Pulmonary Individual Treatment Plan  Patient Details  Name: Samantha Greene MRN: [id number] Date of Birth: 01/01/1971 Referring Provider:     Pulmonary Rehab from 10/11/2018 in [facility] Cardiac and Pulmonary Rehab  Referring Provider  [place]      Initial Encounter Date:    Pulmonary Rehab from 10/11/2018 in [facility] Cardiac and Pulmonary Rehab  Date  10/11/18      Visit Diagnosis: DOE (dyspnea on exertion)  Patient's Home Medications on Admission:  Current Outpatient Medications:  .  albuterol (VENTOLIN HFA) 108 (90 Base) MCG/ACT inhaler, Inhale 2 puffs into the lungs every 4 (four) hours as needed for wheezing or shortness of breath., Disp: 18 g, Rfl: 2 .  amoxicillin-clavulanate (AUGMENTIN) 875-125 MG tablet, Take 1 tablet by mouth 2 (two) times daily. 7 days, Disp: 20 tablet, Rfl: 0 .  aspirin 81 MG tablet, Take 81 mg by mouth daily., Disp: , Rfl:  .  baclofen (LIORESAL) 10 MG tablet, TAKE 1/2 TO 1 TABLET 1 OR 2 X /DAY AS NEEDED FOR MUSCLE SPASMS, Disp: 180 tablet, Rfl: 1 .  buPROPion (WELLBUTRIN XL) 300 MG 24 hr tablet, Take 1 tablet every Morning for Mood, Focus & Concentration, Disp: 90 tablet, Rfl: 3 .  cetirizine (ZYRTEC) 10 MG tablet, Take 10 mg by mouth at bedtime., Disp: , Rfl:  .  Cholecalciferol (VITAMIN D) 125 MCG (5000 UT) CAPS, Take 5,000 Units by mouth daily. , Disp: , Rfl:  .  escitalopram (LEXAPRO) 20 MG tablet, Take 1 tablet Daily for Mood, Disp: 90 tablet, Rfl: 3 .  Eszopiclone 3 MG TABS, TAKE 1 TABLET BY MOUTH AT BEDTIME AS NEEDED. TAKE IMMEDIATELY BEFORE BEDTIME, Disp: 30 tablet, Rfl: 3 .  fluocinolone (SYNALAR) 0.025 % ointment, APPLY TO AFFECTED AREA TWICE A DAY, Disp: 30 g, Rfl: 0 .  fluticasone (FLONASE) 50 MCG/ACT nasal spray, Place 2 sprays into both nostrils daily., Disp: 16 g, Rfl: 2 .  fluticasone furoate-vilanterol (BREO ELLIPTA) 100-25 MCG/INH AEPB, Inhale 1 puff into the lungs daily. Rinse mouth with water after each use, Disp: 1 each, Rfl: 0 .   gabapentin (NEURONTIN) 300 MG capsule, Take 1 capsule (300 mg total) by mouth 3 (three) times daily. (Patient taking differently: Take 300 mg by mouth 3 (three) times daily. Patient taking BID), Disp: 90 capsule, Rfl: 2 .  MELATONIN PO, Take 10 mg by mouth., Disp: , Rfl:  .  meloxicam (MOBIC) 15 MG tablet, Take 1/2 to 1 tablet Daily for Pain & Inflammation  (Limit to 4-5 tabs /week Tto Avoid Kidney Damage), Disp: 90 tablet, Rfl: 3 .  montelukast (SINGULAIR) 10 MG tablet, Take 1 tablet (10 mg total) by mouth at bedtime. Take 1 tablet daily for Allergies, Disp: 90 tablet, Rfl: 3 .  nystatin cream (MYCOSTATIN), Apply 1 application topically 2 (two) times daily. (Patient taking differently: Apply 1 application topically 2 (two) times daily as needed for dry skin. ), Disp: 30 g, Rfl: 1 .  omeprazole (PRILOSEC) 40 MG capsule, TAKE 1 CAPSULE BY MOUTH EVERY DAY, Disp: 90 capsule, Rfl: 3 .  rosuvastatin (CRESTOR) 5 MG tablet, Take 1 tablet (5 mg total) by mouth at bedtime., Disp: 90 tablet, Rfl: 3 .  telmisartan (MICARDIS) 40 MG tablet, TAKE 1 TABLET DAILY FOR BLOOD PRESSURE, Disp: 90 tablet, Rfl: 3 .  triamcinolone ointment (KENALOG) 0.1 %, Apply 1 application topically 2 (two) times daily. (Patient taking differently: Apply 1 application topically daily as needed (rash). ), Disp: 80 g, Rfl: 1 .  vitamin C (ASCORBIC ACID) 500 MG tablet, Take 500 mg by mouth daily., Disp: , Rfl:  .  vitamin E 600 UNIT capsule, Take 600 Units by mouth daily., Disp: , Rfl:   Past Medical History: Past Medical History:  Diagnosis Date  . Anxiety   . Arthritis   . Depression    no meds  . Diaphragmatic hernia with obstruction, without gangrene 11/07/2018  . Dyspnea    can happen anytime  . Family history of breast cancer   . Family history of colon cancer   . Family history of lung cancer   . Family history of pancreatic cancer   . Family history of prostate cancer   . GERD (gastroesophageal reflux disease)   . Hernia,  diaphragmatic   . History of kidney stones   . Hyperlipidemia   . Hypertension    no meds  . Pneumonia    1 time    Tobacco Use: Social History   Tobacco Use  Smoking Status Never Smoker  Smokeless Tobacco Never Used    Labs: Recent Review Flowsheet Data    Labs for ITP Cardiac and Pulmonary Rehab Latest Ref Rng & Units 03/21/2018 03/24/2018 09/11/2018 12/12/2018 03/18/2019   Cholestrol <200 mg/dL - - 226(H) 170 174   LDLCALC mg/dL (calc) - - 132(H) 78 82   HDL > OR = 50 mg/dL - - 62 63 69   Trlycerides <150 mg/dL - - 188(H) 194(H) 136   Hemoglobin A1c <5.7 % of total Hgb - - 5.6 5.4 5.6   PHART 7.350 - 7.450 7.427 7.361 - - -   PCO2ART 32.0 - 48.0 mmHg 35.9 45.9 - - -   HCO3 20.0 - 28.0 mmol/L 23.2 25.4 - - -   ACIDBASEDEF 0.0 - 2.0 mmol/L 0.6 - - - -   O2SAT % 96.8 96.7 - - -       Pulmonary Assessment Scores: Pulmonary Assessment Scores    Row Name 04/09/19 1513         ADL UCSD   SOB Score total  62     Rest  2     Walk  4     Stairs  5     Bath  2     Dress  2     Shop  2       CAT Score   CAT Score  25       mMRC Score   mMRC Score  2        UCSD: Self-administered rating of dyspnea associated with activities of daily living (ADLs) 6-point scale (0 = "not at all" to 5 = "maximal or unable to do because of breathlessness")  Scoring Scores range from 0 to 120.  Minimally important difference is 5 units  CAT: CAT can identify the health impairment of COPD patients and is better correlated with disease progression.  CAT has a scoring range of zero to 40. The CAT score is classified into four groups of low (less than 10), medium (10 - 20), high (21-30) and very high (31-40) based on the impact level of disease on health status. A CAT score over 10 suggests significant symptoms.  A worsening CAT score could be explained by an exacerbation, poor medication adherence, poor inhaler technique, or progression of COPD or comorbid conditions.  CAT MCID is 2  points  mMRC: mMRC (Modified Medical Research Council) Dyspnea Scale is used to assess the degree of baseline functional disability in patients of  respiratory disease due to dyspnea. No minimal important difference is established. A decrease in score of 1 point or greater is considered a positive change.   Pulmonary Function Assessment:   Exercise Target Goals: Exercise Program Goal: Individual exercise prescription set using results from initial 6 min walk test and THRR while considering  patient's activity barriers and safety.   Exercise Prescription Goal: Initial exercise prescription builds to 30-45 minutes a day of aerobic activity, 2-3 days per week.  Home exercise guidelines will be given to patient during program as part of exercise prescription that the participant will acknowledge.  Education: Aerobic Exercise & Resistance Training: - Gives group verbal and written instruction on the various components of exercise. Focuses on aerobic and resistive training programs and the benefits of this training and how to safely progress through these programs..   Education: Exercise & Equipment Safety: - Individual verbal instruction and demonstration of equipment use and safety with use of the equipment.   Pulmonary Rehab from 10/11/2018 in [facility] Cardiac and Pulmonary Rehab  Date  10/11/18  Educator  AS  Instruction Review Code  1- Verbalizes Understanding      Education: Exercise Physiology & General Exercise Guidelines: - Group verbal and written instruction with models to review the exercise physiology of the cardiovascular system and associated critical values. Provides general exercise guidelines with specific guidelines to those with heart or lung disease.    Education: Flexibility, Balance, Mind/Body Relaxation: Provides group verbal/written instruction on the benefits of flexibility and balance training, including mind/body exercise modes such as yoga, pilates and tai chi.   Demonstration and skill practice provided.   Activity Barriers & Risk Stratification: Activity Barriers & Cardiac Risk Stratification - 04/03/19 1218      Activity Barriers & Cardiac Risk Stratification   Activity Barriers  Deconditioning;Muscular Weakness;Shortness of Breath;History of Falls       6 Minute Walk: 6 Minute Walk    Row Name 04/09/19 1504         6 Minute Walk   Phase  Initial     Distance  1200 feet     Walk Time  6 minutes     # of Rest Breaks  0     MPH  2.27     METS  3.37     RPE  17     Perceived Dyspnea   3     VO2 Peak  11.81     Symptoms  No     Resting HR  79 bpm     Resting BP  136/84     Resting Oxygen Saturation   97 %     Exercise Oxygen Saturation  during 6 min walk  94 %     Max Ex. HR  105 bpm     Max Ex. BP  164/82     2 Minute Post BP  138/86       Interval HR   1 Minute HR  92     2 Minute HR  100     3 Minute HR  103     4 Minute HR  105     5 Minute HR  104     6 Minute HR  103     2 Minute Post HR  86     Interval Heart Rate?  Yes       Interval Oxygen   Interval Oxygen?  Yes     Baseline Oxygen Saturation %  97 %  1 Minute Oxygen Saturation %  98 %     1 Minute Liters of Oxygen  0 L     2 Minute Oxygen Saturation %  94 %     2 Minute Liters of Oxygen  0 L     3 Minute Oxygen Saturation %  95 %     3 Minute Liters of Oxygen  0 L     4 Minute Oxygen Saturation %  95 %     4 Minute Liters of Oxygen  0 L     5 Minute Oxygen Saturation %  95 %     5 Minute Liters of Oxygen  0 L     6 Minute Oxygen Saturation %  96 %     6 Minute Liters of Oxygen  0 L     2 Minute Post Oxygen Saturation %  99 %     2 Minute Post Liters of Oxygen  0 L       Oxygen Initial Assessment: Oxygen Initial Assessment - 04/03/19 1221      Home Oxygen   Home Oxygen Device  None    Sleep Oxygen Prescription  CPAP    Home Exercise Oxygen Prescription  None    Home at Rest Exercise Oxygen Prescription  None    Compliance with Home Oxygen  Use  Yes      Initial 6 min Walk   Oxygen Used  None      Program Oxygen Prescription   Program Oxygen Prescription  None      Intervention   Short Term Goals  To learn and exhibit compliance with exercise, home and travel O2 prescription;To learn and understand importance of monitoring SPO2 with pulse oximeter and demonstrate accurate use of the pulse oximeter.;To learn and understand importance of maintaining oxygen saturations>88%;To learn and demonstrate proper pursed lip breathing techniques or other breathing techniques.;To learn and demonstrate proper use of respiratory medications    Long  Term Goals  Exhibits compliance with exercise, home and travel O2 prescription;Verbalizes importance of monitoring SPO2 with pulse oximeter and return demonstration;Maintenance of O2 saturations>88%;Exhibits proper breathing techniques, such as pursed lip breathing or other method taught during program session;Compliance with respiratory medication;Demonstrates proper use of MDI's       Oxygen Re-Evaluation: Oxygen Re-Evaluation    Row Name 04/10/19 1107             Program Oxygen Prescription   Program Oxygen Prescription  None         Home Oxygen   Home Oxygen Device  None       Sleep Oxygen Prescription  CPAP       Home Exercise Oxygen Prescription  None       Home at Rest Exercise Oxygen Prescription  None       Compliance with Home Oxygen Use  Yes         Goals/Expected Outcomes   Short Term Goals  To learn and exhibit compliance with exercise, home and travel O2 prescription;To learn and understand importance of monitoring SPO2 with pulse oximeter and demonstrate accurate use of the pulse oximeter.;To learn and understand importance of maintaining oxygen saturations>88%;To learn and demonstrate proper pursed lip breathing techniques or other breathing techniques.;To learn and demonstrate proper use of respiratory medications       Long  Term Goals  Exhibits compliance with  exercise, home and travel O2 prescription;Verbalizes importance of monitoring SPO2 with pulse oximeter and return demonstration;Maintenance of O2 saturations>88%;Exhibits  proper breathing techniques, such as pursed lip breathing or other method taught during program session;Compliance with respiratory medication;Demonstrates proper use of MDI's       Comments  Reviewed PLB technique with pt.  Talked about how it works and it's importance in maintaining their exercise saturations.       Goals/Expected Outcomes  Short: Become more profiecient at using PLB.   Long: Become independent at using PLB.          Oxygen Discharge (Final Oxygen Re-Evaluation): Oxygen Re-Evaluation - 04/10/19 1107      Program Oxygen Prescription   Program Oxygen Prescription  None      Home Oxygen   Home Oxygen Device  None    Sleep Oxygen Prescription  CPAP    Home Exercise Oxygen Prescription  None    Home at Rest Exercise Oxygen Prescription  None    Compliance with Home Oxygen Use  Yes      Goals/Expected Outcomes   Short Term Goals  To learn and exhibit compliance with exercise, home and travel O2 prescription;To learn and understand importance of monitoring SPO2 with pulse oximeter and demonstrate accurate use of the pulse oximeter.;To learn and understand importance of maintaining oxygen saturations>88%;To learn and demonstrate proper pursed lip breathing techniques or other breathing techniques.;To learn and demonstrate proper use of respiratory medications    Long  Term Goals  Exhibits compliance with exercise, home and travel O2 prescription;Verbalizes importance of monitoring SPO2 with pulse oximeter and return demonstration;Maintenance of O2 saturations>88%;Exhibits proper breathing techniques, such as pursed lip breathing or other method taught during program session;Compliance with respiratory medication;Demonstrates proper use of MDI's    Comments  Reviewed PLB technique with pt.  Talked about how it  works and it's importance in maintaining their exercise saturations.    Goals/Expected Outcomes  Short: Become more profiecient at using PLB.   Long: Become independent at using PLB.       Initial Exercise Prescription: Initial Exercise Prescription - 04/09/19 1500      Treadmill   MPH  2.2    Grade  1.5    Minutes  15    METs  3.1      Recumbant Bike   Level  2    RPM  50    Watts  45    Minutes  15    METs  3.1      Arm Ergometer   Level  1    RPM  30    Minutes  15    METs  2.5      Elliptical   Level  1    Speed  3    Minutes  15      Intensity   THRR 40-80% of Max Heartrate  116-153    Ratings of Perceived Exertion  11-15    Perceived Dyspnea  0-4      Resistance Training   Training Prescription  Yes    Weight  3 lb    Reps  10-15       Perform Capillary Blood Glucose checks as needed.  Exercise Prescription Changes: Exercise Prescription Changes    Row Name 04/11/19 1200 05/07/19 1500           Response to Exercise   Blood Pressure (Admit)  130/78  136/78      Blood Pressure (Exercise)  170/82  152/82      Blood Pressure (Exit)  116/84  132/70      Heart Rate (  Admit)  72 bpm  86 bpm      Heart Rate (Exercise)  101 bpm  120 bpm      Heart Rate (Exit)  95 bpm  98 bpm      Oxygen Saturation (Admit)  98 %  98 %      Oxygen Saturation (Exercise)  95 %  94 %      Oxygen Saturation (Exit)  96 %  98 %      Rating of Perceived Exertion (Exercise)  16  15      Perceived Dyspnea (Exercise)  3  3      Symptoms  none  none      Duration  Continue with 30 min of aerobic exercise without signs/symptoms of physical distress.  Continue with 30 min of aerobic exercise without signs/symptoms of physical distress.      Intensity  THRR unchanged  THRR unchanged        Progression   Progression  --  Continue to progress workloads to maintain intensity without signs/symptoms of physical distress.      Average METs  --  2.8        Resistance Training   Training  Prescription  Yes  Yes      Weight  3 lb  3 lb      Reps  10-15  10-15        Interval Training   Interval Training  No  No        Recumbant Bike   Level  2  3      RPM  50  50      Watts  15  --      Minutes  15  15      METs  2.47  3.39        Arm Ergometer   Level  1  1      RPM  30  30      Minutes  15  15      METs  2.8  2.2         Exercise Comments:   Exercise Goals and Review: Exercise Goals    Row Name 04/09/19 1519             Exercise Goals   Increase Physical Activity  Yes       Intervention  Provide advice, education, support and counseling about physical activity/exercise needs.;Develop an individualized exercise prescription for aerobic and resistive training based on initial evaluation findings, risk stratification, comorbidities and participant's personal goals.       Expected Outcomes  Short Term: Attend rehab on a regular basis to increase amount of physical activity.;Long Term: Add in home exercise to make exercise part of routine and to increase amount of physical activity.;Long Term: Exercising regularly at least 3-5 days a week.       Increase Strength and Stamina  Yes       Intervention  Provide advice, education, support and counseling about physical activity/exercise needs.;Develop an individualized exercise prescription for aerobic and resistive training based on initial evaluation findings, risk stratification, comorbidities and participant's personal goals.       Expected Outcomes  Short Term: Increase workloads from initial exercise prescription for resistance, speed, and METs.;Short Term: Perform resistance training exercises routinely during rehab and add in resistance training at home;Long Term: Improve cardiorespiratory fitness, muscular endurance and strength as measured by increased METs and functional capacity (6MWT)       Able  to understand and use rate of perceived exertion (RPE) scale  Yes       Intervention  Provide education and  explanation on how to use RPE scale       Expected Outcomes  Short Term: Able to use RPE daily in rehab to express subjective intensity level;Long Term:  Able to use RPE to guide intensity level when exercising independently       Able to understand and use Dyspnea scale  Yes       Intervention  Provide education and explanation on how to use Dyspnea scale       Expected Outcomes  Short Term: Able to use Dyspnea scale daily in rehab to express subjective sense of shortness of breath during exertion;Long Term: Able to use Dyspnea scale to guide intensity level when exercising independently       Knowledge and understanding of Target Heart Rate Range (THRR)  Yes       Intervention  Provide education and explanation of THRR including how the numbers were predicted and where they are located for reference       Expected Outcomes  Short Term: Able to state/look up THRR;Short Term: Able to use daily as guideline for intensity in rehab;Long Term: Able to use THRR to govern intensity when exercising independently       Able to check pulse independently  Yes       Intervention  Provide education and demonstration on how to check pulse in carotid and radial arteries.;Review the importance of being able to check your own pulse for safety during independent exercise       Expected Outcomes  Short Term: Able to explain why pulse checking is important during independent exercise;Long Term: Able to check pulse independently and accurately       Understanding of Exercise Prescription  Yes       Intervention  Provide education, explanation, and written materials on patient's individual exercise prescription       Expected Outcomes  Short Term: Able to explain program exercise prescription;Long Term: Able to explain home exercise prescription to exercise independently          Exercise Goals Re-Evaluation : Exercise Goals Re-Evaluation    Row Name 04/10/19 1105 04/11/19 1300 04/24/19 0933 05/07/19 1540 05/13/19  1011     Exercise Goal Re-Evaluation   Exercise Goals Review  Increase Physical Activity;Increase Strength and Stamina;Understanding of Exercise Prescription;Able to understand and use Dyspnea scale;Able to understand and use rate of perceived exertion (RPE) scale;Knowledge and understanding of Target Heart Rate Range (THRR)  Increase Physical Activity;Increase Strength and Stamina;Able to understand and use rate of perceived exertion (RPE) scale;Able to understand and use Dyspnea scale;Knowledge and understanding of Target Heart Rate Range (THRR);Able to check pulse independently  --  Increase Physical Activity;Increase Strength and Stamina;Able to understand and use rate of perceived exertion (RPE) scale;Able to understand and use Dyspnea scale;Knowledge and understanding of Target Heart Rate Range (THRR);Able to check pulse independently;Understanding of Exercise Prescription  Increase Physical Activity;Increase Strength and Stamina;Able to understand and use rate of perceived exertion (RPE) scale;Able to understand and use Dyspnea scale;Knowledge and understanding of Target Heart Rate Range (THRR);Able to check pulse independently;Understanding of Exercise Prescription   Comments  Reviewed RPE scale, THR and program prescription with pt today.  Pt voiced understanding and was given a copy of goals to take home.  --  Out sick since last review.  Raeleen is just returning after being ill.  Emonie is  using her 5 lb weights at home.  This EP will give info on [employer]   Expected Outcomes  Short: Use RPE daily to regulate intensity. Long: Follow program prescription in THR.  --  --  Short : build back up after being ill Long: build overall stamina  Short: consider walking at the mall or water aerobics  Long:  exercise 3-5 days per week      Discharge Exercise Prescription (Final Exercise Prescription Changes): Exercise Prescription Changes - 05/07/19 1500      Response to Exercise   Blood  Pressure (Admit)  136/78    Blood Pressure (Exercise)  152/82    Blood Pressure (Exit)  132/70    Heart Rate (Admit)  86 bpm    Heart Rate (Exercise)  120 bpm    Heart Rate (Exit)  98 bpm    Oxygen Saturation (Admit)  98 %    Oxygen Saturation (Exercise)  94 %    Oxygen Saturation (Exit)  98 %    Rating of Perceived Exertion (Exercise)  15    Perceived Dyspnea (Exercise)  3    Symptoms  none    Duration  Continue with 30 min of aerobic exercise without signs/symptoms of physical distress.    Intensity  THRR unchanged      Progression   Progression  Continue to progress workloads to maintain intensity without signs/symptoms of physical distress.    Average METs  2.8      Resistance Training   Training Prescription  Yes    Weight  3 lb    Reps  10-15      Interval Training   Interval Training  No      Recumbant Bike   Level  3    RPM  50    Minutes  15    METs  3.39      Arm Ergometer   Level  1    RPM  30    Minutes  15    METs  2.2       Nutrition:  Target Goals: Understanding of nutrition guidelines, daily intake of sodium <1577m, cholesterol <2037m calories 30% from fat and 7% or less from saturated fats, daily to have 5 or more servings of fruits and vegetables.  Education: Controlling Sodium/Reading Food Labels -Group verbal and written material supporting the discussion of sodium use in heart healthy nutrition. Review and explanation with models, verbal and written materials for utilization of the food label.   Education: General Nutrition Guidelines/Fats and Fiber: -Group instruction provided by verbal, written material, models and posters to present the general guidelines for heart healthy nutrition. Gives an explanation and review of dietary fats and fiber.   Biometrics:    Nutrition Therapy Plan and Nutrition Goals:   Nutrition Assessments:   MEDIFICTS Score Key:          ?70 Need to make dietary changes          40-70 Heart Healthy Diet          ? 40 Therapeutic Level Cholesterol Diet  Nutrition Goals Re-Evaluation:   Nutrition Goals Discharge (Final Nutrition Goals Re-Evaluation):   Psychosocial: Target Goals: Acknowledge presence or absence of significant depression and/or stress, maximize coping skills, provide positive support system. Participant is able to verbalize types and ability to use techniques and skills needed for reducing stress and depression.   Education: Depression - Provides group verbal and written instruction on the correlation between heart/lung disease and  depressed mood, treatment options, and the stigmas associated with seeking treatment.   Education: Sleep Hygiene -Provides group verbal and written instruction about how sleep can affect your health.  Define sleep hygiene, discuss sleep cycles and impact of sleep habits. Review good sleep hygiene tips.    Education: Stress and Anxiety: - Provides group verbal and written instruction about the health risks of elevated stress and causes of high stress.  Discuss the correlation between heart/lung disease and anxiety and treatment options. Review healthy ways to manage with stress and anxiety.   Initial Review & Psychosocial Screening: Initial Psych Review & Screening - 04/03/19 1219      Initial Review   Current issues with  Current Psychotropic Meds;Current Sleep Concerns;Current Depression;Current Stress Concerns    Source of Stress Concerns  Chronic Illness;Family;Poor Coping Skills;Unable to participate in former interests or hobbies;Unable to perform yard/household activities    Comments  She stresses about many things.  She has been caring for her aunt over the past year.  She has lost several family members to suicide and murder. She wants to breathe better to be able to do more at home.      Family Dynamics   Good Support System?  Yes   Husband, aunt, son   Comments  Recent loss of family and friends (4 in the last two months)       Barriers   Psychosocial barriers to participate in program  The patient should benefit from training in stress management and relaxation.;Psychosocial barriers identified (see note)      Screening Interventions   Interventions  Encouraged to exercise;To provide support and resources with identified psychosocial needs;Provide feedback about the scores to participant    Expected Outcomes  Short Term goal: Utilizing psychosocial counselor, staff and physician to assist with identification of specific Stressors or current issues interfering with healing process. Setting desired goal for each stressor or current issue identified.;Long Term Goal: Stressors or current issues are controlled or eliminated.;Short Term goal: Identification and review with participant of any Quality of Life or Depression concerns found by scoring the questionnaire.;Long Term goal: The participant improves quality of Life and PHQ9 Scores as seen by post scores and/or verbalization of changes       Quality of Life Scores:  Scores of 19 and below usually indicate a poorer quality of life in these areas.  A difference of  2-3 points is a clinically meaningful difference.  A difference of 2-3 points in the total score of the Quality of Life Index has been associated with significant improvement in overall quality of life, self-image, physical symptoms, and general health in studies assessing change in quality of life.  PHQ-9: Recent Review Flowsheet Data    Depression screen [facility] 2/9 04/09/2019 10/29/2018   Decreased Interest 3 3   Down, Depressed, Hopeless 3 3   PHQ - 2 Score 6 6   Altered sleeping 3 1   Tired, decreased energy 3 3   Change in appetite 3 3   Feeling bad or failure about yourself  2 3   Trouble concentrating 3 3   Moving slowly or fidgety/restless 1 2   Suicidal thoughts 0 0   PHQ-9 Score 21 21   Difficult doing work/chores - Very difficult     Interpretation of Total Score  Total Score Depression  Severity:  1-4 = Minimal depression, 5-9 = Mild depression, 10-14 = Moderate depression, 15-19 = Moderately severe depression, 20-27 = Severe depression   Psychosocial  Evaluation and Intervention: Psychosocial Evaluation - 04/03/19 1226      Psychosocial Evaluation & Interventions   Interventions  Stress management education;Encouraged to exercise with the program and follow exercise prescription    Comments  Selinda Eon is coming back to rehab after taking time off to help care for her aunt over the past year.  Seh has had a rough few months after losing family to suicide and murder.  She has a significant history of depression and anxiety but feels that her meds are helping to keep it manageable for her.  She really is looking forward to returning to rehab again! So much so that she turned up in person for her phone call today!  She wants to work on her breathing and lose the weight she has gained over the last few months.    Expected Outcomes  Short: Attend rehab regularly and use exerise as a stress outlet.  Long: Work on weight loss and breathing to be more functional.    Continue Psychosocial Services   Follow up required by staff       Psychosocial Re-Evaluation: Psychosocial Re-Evaluation    [place] Name 05/13/19 1023 05/13/19 1026           Psychosocial Re-Evaluation   Current issues with  Current Sleep Concerns;Current Stress Concerns  --      Comments  [name] sees her Dr today - she still hasnt been sleeping well even using melatonin and CPAP.  They may switch to Bipap  [name] is calling her Dr today neurologist) - she still hasnt been sleeping well even using melatonin and CPAP.  They may switch to Bipap      Expected Outcomes  Short: follow up with Dr Laverta Baltimore: improve sleep quality  --         Psychosocial Discharge (Final Psychosocial Re-Evaluation): Psychosocial Re-Evaluation - 05/13/19 1026      Psychosocial Re-Evaluation   Comments  [name] is calling her Dr today  neurologist) - she still hasnt been sleeping well even using melatonin and CPAP.  They may switch to Bipap       Education: Education Goals: Education classes will be provided on a weekly basis, covering required topics. Participant will state understanding/return demonstration of topics presented.  Learning Barriers/Preferences: Learning Barriers/Preferences - 04/03/19 1219      Learning Barriers/Preferences   Learning Barriers  Hearing    Learning Preferences  None       General Pulmonary Education Topics:  Infection Prevention: - Provides verbal and written material to individual with discussion of infection control including proper hand washing and proper equipment cleaning during exercise session.   Pulmonary Rehab from 10/11/2018 in [facility] Cardiac and Pulmonary Rehab  Date  10/11/18  Educator  AS  Instruction Review Code  1- Verbalizes Understanding      Falls Prevention: - Provides verbal and written material to individual with discussion of falls prevention and safety.   Pulmonary Rehab from 10/11/2018 in [facility] Cardiac and Pulmonary Rehab  Date  10/11/18  Educator  AS  Instruction Review Code  1- Verbalizes Understanding      Chronic Lung Diseases: - Group verbal and written instruction to review updates, respiratory medications, advancements in procedures and treatments. Discuss use of supplemental oxygen including available portable oxygen systems, continuous and intermittent flow rates, concentrators, personal use and safety guidelines. Review proper use of inhaler and spacers. Provide informative websites for self-education.    Energy Conservation: - Provide group verbal and written instruction for methods  to conserve energy, plan and organize activities. Instruct on pacing techniques, use of adaptive equipment and posture/positioning to relieve shortness of breath.   Triggers and Exacerbations: - Group verbal and written instruction to review types of  environmental triggers and ways to prevent exacerbations. Discuss weather changes, air quality and the benefits of nasal washing. Review warning signs and symptoms to help prevent infections. Discuss techniques for effective airway clearance, coughing, and vibrations.   AED/CPR: - Group verbal and written instruction with the use of models to demonstrate the basic use of the AED with the basic ABC's of resuscitation.   Anatomy and Physiology of the Lungs: - Group verbal and written instruction with the use of models to provide basic lung anatomy and physiology related to function, structure and complications of lung disease.   Anatomy & Physiology of the Heart: - Group verbal and written instruction and models provide basic cardiac anatomy and physiology, with the coronary electrical and arterial systems. Review of Valvular disease and Heart Failure   Cardiac Medications: - Group verbal and written instruction to review commonly prescribed medications for heart disease. Reviews the medication, class of the drug, and side effects.   Other: -Provides group and verbal instruction on various topics (see comments)   Knowledge Questionnaire Score: Knowledge Questionnaire Score - 04/09/19 1518      Knowledge Questionnaire Score   Pre Score  15/18        Core Components/Risk Factors/Patient Goals at Admission: Personal Goals and Risk Factors at Admission - 04/09/19 1502      Core Components/Risk Factors/Patient Goals on Admission   Intervention  Weight Management: Develop a combined nutrition and exercise program designed to reach desired caloric intake, while maintaining appropriate intake of nutrient and fiber, sodium and fats, and appropriate energy expenditure required for the weight goal.;Weight Management: Provide education and appropriate resources to help participant work on and attain dietary goals.;Weight Management/Obesity: Establish reasonable short term and long term weight  goals.;Obesity: Provide education and appropriate resources to help participant work on and attain dietary goals.    Admit Weight  226 lb (102.5 kg)    Goal Weight: Short Term  215 lb (97.5 kg)    Goal Weight: Long Term  200 lb (90.7 kg)    Expected Outcomes  Short Term: Continue to assess and modify interventions until short term weight is achieved;Long Term: Adherence to nutrition and physical activity/exercise program aimed toward attainment of established weight goal;Weight Loss: Understanding of general recommendations for a balanced deficit meal plan, which promotes 1-2 lb weight loss per week and includes a negative energy balance of [phone] kcal/d;Understanding recommendations for meals to include 15-35% energy as protein, 25-35% energy from fat, 35-60% energy from carbohydrates, less than 256m of dietary cholesterol, 20-35 gm of total fiber daily;Understanding of distribution of calorie intake throughout the day with the consumption of 4-5 meals/snacks    Intervention  Provide education on lifestyle modifcations including regular physical activity/exercise, weight management, moderate sodium restriction and increased consumption of fresh fruit, vegetables, and low fat dairy, alcohol moderation, and smoking cessation.;Monitor prescription use compliance.    Expected Outcomes  Short Term: Continued assessment and intervention until BP is < 140/994mHG in hypertensive participants. < 130/8024mG in hypertensive participants with diabetes, heart failure or chronic kidney disease.;Long Term: Maintenance of blood pressure at goal levels.       Education:Diabetes - Individual verbal and written instruction to review signs/symptoms of diabetes, desired ranges of glucose level fasting, after meals and  with exercise. Acknowledge that pre and post exercise glucose checks will be done for 3 sessions at entry of program.   Education: Know Your Numbers and Risk Factors: -Group verbal and written  instruction about important numbers in your health.  Discussion of what are risk factors and how they play a role in the disease process.  Review of Cholesterol, Blood Pressure, Diabetes, and BMI and the role they play in your overall health.   Core Components/Risk Factors/Patient Goals Review:  Goals and Risk Factor Review    Row Name 05/13/19 1008             Core Components/Risk Factors/Patient Goals Review   Personal Goals Review  Weight Management/Obesity;Hypertension;Lipids       Review  [name] had first Covid vaccine last week.  She felt a little scratchy throat but is better now.  She is on allergy and cold medicine.  She is taking all other meds as directed.  Weight is staying about the same.       Expected Outcomes  Short:  continue with current plan Long:  manage risk factors          Core Components/Risk Factors/Patient Goals at Discharge (Final Review):  Goals and Risk Factor Review - 05/13/19 1008      Core Components/Risk Factors/Patient Goals Review   Personal Goals Review  Weight Management/Obesity;Hypertension;Lipids    Review  [name] had first Covid vaccine last week.  She felt a little scratchy throat but is better now.  She is on allergy and cold medicine.  She is taking all other meds as directed.  Weight is staying about the same.    Expected Outcomes  Short:  continue with current plan Long:  manage risk factors       ITP Comments: ITP Comments    Row Name 12/03/18 1419 12/04/18 1151 12/05/18 0615 01/01/19 1144 01/01/19 1429   ITP Comments  [name] called out today. She has not been able to be here this month due to caring for her aunt.  Unable to complete nutrition 30-day re-eval cycle due to inconsistent attendence  30 day review completed. Continue with ITP sent to Dr. Emily Filbert, Medical Director of Cardiac and Pulmonary Rehab for review , changes as needed and signature.  Last visit 9/30  Calls have been made  Called to check on pt.  Out since 10/31/18  caring for her aunt.  Left message.  Sent message on MyChart.  [name] returned our message.  She would like to discharge at this time.   [place] Name 04/03/19 1240 04/10/19 1104 04/24/19 0553 05/22/19 0533     ITP Comments  Completed virtual orientation today.  EP evaluation is scheduled for Tuesday 3/9 at 2pm.  Documentation for diagnosis can be found in [facility] encounter 03/18/19.  She will restart on session number 8.  First full day of exercise!  Patient was oriented to gym and equipment including functions, settings, policies, and procedures.  Patient's individual exercise prescription and treatment plan were reviewed.  All starting workloads were established based on the results of the 6 minute walk test done at initial orientation visit.  The plan for exercise progression was also introduced and progression will be customized based on patient's performance and goals.  30 day chart review completed. ITP sent to Dr Zachery Dakins Medical Director, for review,changes as needed and signature. Continue with ITP if no changes requested    New to program  30 Day review completed. Medical Director review done,  changes made as directed,and approval shown by signature of Market researcher.       Comments:

## 2019-05-24 ENCOUNTER — Encounter: Payer: BC Managed Care – PPO | Admitting: *Deleted

## 2019-05-24 ENCOUNTER — Other Ambulatory Visit: Payer: Self-pay

## 2019-05-24 ENCOUNTER — Other Ambulatory Visit: Payer: Self-pay | Admitting: Internal Medicine

## 2019-05-24 DIAGNOSIS — Z7982 Long term (current) use of aspirin: Secondary | ICD-10-CM | POA: Diagnosis not present

## 2019-05-24 DIAGNOSIS — I10 Essential (primary) hypertension: Secondary | ICD-10-CM | POA: Diagnosis not present

## 2019-05-24 DIAGNOSIS — F419 Anxiety disorder, unspecified: Secondary | ICD-10-CM | POA: Diagnosis not present

## 2019-05-24 DIAGNOSIS — R0609 Other forms of dyspnea: Secondary | ICD-10-CM

## 2019-05-24 DIAGNOSIS — E785 Hyperlipidemia, unspecified: Secondary | ICD-10-CM | POA: Diagnosis not present

## 2019-05-24 DIAGNOSIS — F329 Major depressive disorder, single episode, unspecified: Secondary | ICD-10-CM | POA: Diagnosis not present

## 2019-05-24 DIAGNOSIS — Z79899 Other long term (current) drug therapy: Secondary | ICD-10-CM | POA: Diagnosis not present

## 2019-05-24 DIAGNOSIS — G4733 Obstructive sleep apnea (adult) (pediatric): Secondary | ICD-10-CM | POA: Diagnosis not present

## 2019-05-24 DIAGNOSIS — R0600 Dyspnea, unspecified: Secondary | ICD-10-CM | POA: Diagnosis not present

## 2019-05-24 MED ORDER — MONTELUKAST SODIUM 10 MG PO TABS: ORAL_TABLET | ORAL | 0 refills | Status: DC

## 2019-05-24 NOTE — Progress Notes (Signed)
Daily Session Note  Patient Details  Name: Samantha Greene MRN: [id number] Date of Birth: [birth year] Referring Provider:     Pulmonary Rehab from 10/11/2018 in [facility] Cardiac and Pulmonary Rehab  Referring Provider  [place]      Encounter Date: 05/24/2019  Check In: Session Check In - 05/24/19 1049      Check-In   Supervising physician immediately available to respond to emergencies  See telemetry face sheet for immediately available ER MD    Location  [facility]-Cardiac & Pulmonary Rehab    Staff Present  Hope Budds RDN, LDN;Meredith Sherryll Burger, [name];Jessica Luan Pulling, MA, RCEP, CCRP, CCET;Joseph Hood RCP,RRT,BSRT    Virtual Visit  No    Medication changes reported      No    Fall or balance concerns reported     No    Warm-up and Cool-down  Performed on first and last piece of equipment    Resistance Training Performed  Yes    VAD Patient?  No    PAD/SET Patient?  No      Pain Assessment   Currently in Pain?  No/denies          Social History   Tobacco Use  Smoking Status Never Smoker  Smokeless Tobacco Never Used    Goals Met:  Independence with exercise equipment Exercise tolerated well No report of cardiac concerns or symptoms  Goals Unmet:  Not Applicable  Comments: Pt able to follow exercise prescription today without complaint.  Will continue to monitor for progression.    Dr. Emily Filbert is Medical Director for [facility].

## 2019-05-27 ENCOUNTER — Encounter: Payer: BC Managed Care – PPO | Admitting: *Deleted

## 2019-05-27 ENCOUNTER — Other Ambulatory Visit: Payer: Self-pay

## 2019-05-27 DIAGNOSIS — R0609 Other forms of dyspnea: Secondary | ICD-10-CM

## 2019-05-27 DIAGNOSIS — F329 Major depressive disorder, single episode, unspecified: Secondary | ICD-10-CM | POA: Diagnosis not present

## 2019-05-27 DIAGNOSIS — I10 Essential (primary) hypertension: Secondary | ICD-10-CM | POA: Diagnosis not present

## 2019-05-27 DIAGNOSIS — F419 Anxiety disorder, unspecified: Secondary | ICD-10-CM | POA: Diagnosis not present

## 2019-05-27 DIAGNOSIS — Z7982 Long term (current) use of aspirin: Secondary | ICD-10-CM | POA: Diagnosis not present

## 2019-05-27 DIAGNOSIS — Z79899 Other long term (current) drug therapy: Secondary | ICD-10-CM | POA: Diagnosis not present

## 2019-05-27 DIAGNOSIS — E785 Hyperlipidemia, unspecified: Secondary | ICD-10-CM | POA: Diagnosis not present

## 2019-05-27 DIAGNOSIS — R0600 Dyspnea, unspecified: Secondary | ICD-10-CM | POA: Diagnosis not present

## 2019-05-27 NOTE — Progress Notes (Signed)
Daily Session Note  Patient Details  Name: Samantha Greene MRN: [id number] Date of Birth: 10-09-1970 Referring Provider:     Pulmonary Rehab from 10/11/2018 in [facility] Cardiac and Pulmonary Rehab  Referring Provider  [place]      Encounter Date: 05/27/2019  Check In: Session Check In - 05/27/19 1010      Check-In   Supervising physician immediately available to respond to emergencies  See telemetry face sheet for immediately available ER MD    Location  [facility]-Cardiac & Pulmonary Rehab    Staff Present  [name], RN Moises Blood, BS, ACSM CEP, Exercise Physiologist;Joseph Darrin Nipper, [place], RCEP, CCRP, CCET    Virtual Visit  No    Medication changes reported      No    Warm-up and Cool-down  Performed on first and last piece of equipment    Resistance Training Performed  Yes    VAD Patient?  No    PAD/SET Patient?  No      Pain Assessment   Currently in Pain?  No/denies          Social History   Tobacco Use  Smoking Status Never Smoker  Smokeless Tobacco Never Used    Goals Met:  Independence with exercise equipment Exercise tolerated well No report of cardiac concerns or symptoms Strength training completed today  Goals Unmet:  Not Applicable  Comments: Pt able to follow exercise prescription today without complaint.  Will continue to monitor for progression.    Dr. Emily Filbert is Medical Director for [employer] and [facility].

## 2019-05-28 ENCOUNTER — Encounter: Payer: Self-pay | Admitting: Neurology

## 2019-05-28 ENCOUNTER — Ambulatory Visit: Payer: BC Managed Care – PPO | Admitting: Neurology

## 2019-05-28 ENCOUNTER — Other Ambulatory Visit: Payer: Self-pay

## 2019-05-28 VITALS — BP 129/79 | HR 72 | Temp 97.5°F | Ht 61.0 in | Wt 224.0 lb

## 2019-05-28 DIAGNOSIS — G4730 Sleep apnea, unspecified: Secondary | ICD-10-CM | POA: Diagnosis not present

## 2019-05-28 DIAGNOSIS — G4710 Hypersomnia, unspecified: Secondary | ICD-10-CM | POA: Diagnosis not present

## 2019-05-28 DIAGNOSIS — Z6841 Body Mass Index (BMI) 40.0 and over, adult: Secondary | ICD-10-CM | POA: Insufficient documentation

## 2019-05-28 DIAGNOSIS — Z9989 Dependence on other enabling machines and devices: Secondary | ICD-10-CM | POA: Insufficient documentation

## 2019-05-28 DIAGNOSIS — K440 Diaphragmatic hernia with obstruction, without gangrene: Secondary | ICD-10-CM

## 2019-05-28 NOTE — Patient Instructions (Signed)
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
## 2019-05-29 ENCOUNTER — Ambulatory Visit: Payer: BC Managed Care – PPO

## 2019-05-29 ENCOUNTER — Ambulatory Visit: Payer: BC Managed Care – PPO | Attending: Internal Medicine

## 2019-05-29 ENCOUNTER — Encounter: Payer: BC Managed Care – PPO | Admitting: *Deleted

## 2019-05-29 DIAGNOSIS — Z23 Encounter for immunization: Secondary | ICD-10-CM

## 2019-05-29 DIAGNOSIS — R0609 Other forms of dyspnea: Secondary | ICD-10-CM

## 2019-05-29 DIAGNOSIS — Z79899 Other long term (current) drug therapy: Secondary | ICD-10-CM | POA: Diagnosis not present

## 2019-05-29 DIAGNOSIS — E785 Hyperlipidemia, unspecified: Secondary | ICD-10-CM | POA: Diagnosis not present

## 2019-05-29 DIAGNOSIS — F329 Major depressive disorder, single episode, unspecified: Secondary | ICD-10-CM | POA: Diagnosis not present

## 2019-05-29 DIAGNOSIS — Z7982 Long term (current) use of aspirin: Secondary | ICD-10-CM | POA: Diagnosis not present

## 2019-05-29 DIAGNOSIS — R0600 Dyspnea, unspecified: Secondary | ICD-10-CM | POA: Diagnosis not present

## 2019-05-29 DIAGNOSIS — F419 Anxiety disorder, unspecified: Secondary | ICD-10-CM | POA: Diagnosis not present

## 2019-05-29 DIAGNOSIS — I10 Essential (primary) hypertension: Secondary | ICD-10-CM | POA: Diagnosis not present

## 2019-05-29 NOTE — Progress Notes (Signed)
   Covid-19 Vaccination Clinic  Name:  Samantha Greene    MRN: [id number] DOB: [birth year]  05/29/2019  [name] was observed post Covid-19 immunization for 15 minutes without incident. She was provided with Vaccine Information Sheet and instruction to access the V-Safe system.   [name] was instructed to call 911 with any severe reactions post vaccine: Marland Kitchen Difficulty breathing  . Swelling of face and throat  . A fast heartbeat  . A bad rash all over body  . Dizziness and weakness   Immunizations Administered    Name Date Dose VIS Date Route   Pfizer COVID-19 Vaccine 05/29/2019 12:11 PM 0.3 mL 03/27/2018 Intramuscular   Manufacturer: [employer], [employer]   Lot: [id number]   NDC: [address]-8

## 2019-05-29 NOTE — Progress Notes (Signed)
Daily Session Note  Patient Details  Name: Samantha Greene MRN: [id number] Date of Birth: 1970/10/27 Referring Provider:     Pulmonary Rehab from 10/11/2018 in [facility] Cardiac and Pulmonary Rehab  Referring Provider  [place]      Encounter Date: 05/29/2019  Check In: Session Check In - 05/29/19 1024      Check-In   Supervising physician immediately available to respond to emergencies  See telemetry face sheet for immediately available ER MD    Location  [facility]-Cardiac & Pulmonary Rehab    Staff Present  [name], [name];Joseph Foy [place], [place], ACSM CEP, Exercise Physiologist    Virtual Visit  No    Medication changes reported      No    Fall or balance concerns reported     No    Warm-up and Cool-down  Performed on first and last piece of equipment    Resistance Training Performed  Yes    VAD Patient?  No    PAD/SET Patient?  No      Pain Assessment   Currently in Pain?  No/denies          Social History   Tobacco Use  Smoking Status Never Smoker  Smokeless Tobacco Never Used    Goals Met:  Independence with exercise equipment Exercise tolerated well No report of cardiac concerns or symptoms Strength training completed today  Goals Unmet:  Not Applicable  Comments: Pt able to follow exercise prescription today without complaint.  Will continue to monitor for progression.    Dr. Emily Filbert is Medical Director for [place] and [facility].

## 2019-05-31 ENCOUNTER — Other Ambulatory Visit: Payer: Self-pay

## 2019-05-31 DIAGNOSIS — Z7982 Long term (current) use of aspirin: Secondary | ICD-10-CM | POA: Diagnosis not present

## 2019-05-31 DIAGNOSIS — E785 Hyperlipidemia, unspecified: Secondary | ICD-10-CM | POA: Diagnosis not present

## 2019-05-31 DIAGNOSIS — R0600 Dyspnea, unspecified: Secondary | ICD-10-CM | POA: Diagnosis not present

## 2019-05-31 DIAGNOSIS — Z79899 Other long term (current) drug therapy: Secondary | ICD-10-CM | POA: Diagnosis not present

## 2019-05-31 DIAGNOSIS — I10 Essential (primary) hypertension: Secondary | ICD-10-CM | POA: Diagnosis not present

## 2019-05-31 DIAGNOSIS — F329 Major depressive disorder, single episode, unspecified: Secondary | ICD-10-CM | POA: Diagnosis not present

## 2019-05-31 DIAGNOSIS — F419 Anxiety disorder, unspecified: Secondary | ICD-10-CM | POA: Diagnosis not present

## 2019-05-31 DIAGNOSIS — R0609 Other forms of dyspnea: Secondary | ICD-10-CM

## 2019-05-31 NOTE — Progress Notes (Signed)
Daily Session Note  Patient Details  Name: Samantha Greene MRN: [id number] Date of Birth: 07/17/1970 Referring Provider:     Pulmonary Rehab from 10/11/2018 in [facility] Cardiac and Pulmonary Rehab  Referring Provider  [place]      Encounter Date: 05/31/2019  Check In: Session Check In - 05/31/19 1015      Check-In   Supervising physician immediately available to respond to emergencies  See telemetry face sheet for immediately available ER MD    Location  [facility]-Cardiac & Pulmonary Rehab    Staff Present  Vida Rigger RN, BSN;Jessica Luan Pulling, MA, RCEP, CCRP, CCET;Joseph Hood RCP,RRT,BSRT    Virtual Visit  No    Medication changes reported      No    Fall or balance concerns reported     No    Warm-up and Cool-down  Performed on first and last piece of equipment    Resistance Training Performed  Yes    VAD Patient?  No    PAD/SET Patient?  No      Pain Assessment   Currently in Pain?  No/denies          Social History   Tobacco Use  Smoking Status Never Smoker  Smokeless Tobacco Never Used    Goals Met:  Proper associated with RPD/PD & O2 Sat Independence with exercise equipment Using PLB without cueing & demonstrates good technique Exercise tolerated well No report of cardiac concerns or symptoms Strength training completed today  Goals Unmet:  Not Applicable  Comments: Pt able to follow exercise prescription today without complaint.  Will continue to monitor for progression.   Dr. Emily Filbert is Medical Director for [place] and [facility].

## 2019-06-02 ENCOUNTER — Encounter: Payer: Self-pay | Admitting: Neurology

## 2019-06-03 ENCOUNTER — Telehealth: Payer: Self-pay | Admitting: Neurology

## 2019-06-03 NOTE — Telephone Encounter (Signed)
PT completed ONO through [facility]. Results have been sent over for Dr Vickey Huger to review.

## 2019-06-04 ENCOUNTER — Encounter: Payer: Self-pay | Admitting: Neurology

## 2019-06-04 LAB — NARCOLEPSY EVALUATION
DQA1*01:02: NEGATIVE
DQB1*06:02: NEGATIVE

## 2019-06-04 NOTE — Progress Notes (Signed)
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

## 2019-06-05 ENCOUNTER — Encounter: Payer: BC Managed Care – PPO | Attending: Internal Medicine

## 2019-06-05 DIAGNOSIS — F419 Anxiety disorder, unspecified: Secondary | ICD-10-CM | POA: Insufficient documentation

## 2019-06-05 DIAGNOSIS — I10 Essential (primary) hypertension: Secondary | ICD-10-CM | POA: Insufficient documentation

## 2019-06-05 DIAGNOSIS — Z79899 Other long term (current) drug therapy: Secondary | ICD-10-CM | POA: Insufficient documentation

## 2019-06-05 DIAGNOSIS — R0600 Dyspnea, unspecified: Secondary | ICD-10-CM | POA: Insufficient documentation

## 2019-06-05 DIAGNOSIS — E785 Hyperlipidemia, unspecified: Secondary | ICD-10-CM | POA: Insufficient documentation

## 2019-06-05 DIAGNOSIS — Z7982 Long term (current) use of aspirin: Secondary | ICD-10-CM | POA: Insufficient documentation

## 2019-06-05 DIAGNOSIS — F329 Major depressive disorder, single episode, unspecified: Secondary | ICD-10-CM | POA: Insufficient documentation

## 2019-06-06 ENCOUNTER — Encounter: Payer: Self-pay | Admitting: Neurology

## 2019-06-10 ENCOUNTER — Other Ambulatory Visit: Payer: Self-pay | Admitting: Neurology

## 2019-06-10 DIAGNOSIS — G4710 Hypersomnia, unspecified: Secondary | ICD-10-CM

## 2019-06-10 DIAGNOSIS — Z9981 Dependence on supplemental oxygen: Secondary | ICD-10-CM

## 2019-06-10 DIAGNOSIS — G4730 Sleep apnea, unspecified: Secondary | ICD-10-CM

## 2019-06-10 DIAGNOSIS — R0902 Hypoxemia: Secondary | ICD-10-CM

## 2019-06-11 ENCOUNTER — Telehealth: Payer: Self-pay

## 2019-06-11 NOTE — Telephone Encounter (Signed)
Called pt as she has not been to rehab since 4/30 - wanted to check in. Left message.

## 2019-06-12 ENCOUNTER — Other Ambulatory Visit: Payer: Self-pay | Admitting: Physician Assistant

## 2019-06-12 DIAGNOSIS — R0609 Other forms of dyspnea: Secondary | ICD-10-CM

## 2019-06-13 ENCOUNTER — Encounter: Payer: Self-pay | Admitting: Internal Medicine

## 2019-06-13 ENCOUNTER — Other Ambulatory Visit: Payer: Self-pay

## 2019-06-13 ENCOUNTER — Ambulatory Visit: Payer: BC Managed Care – PPO | Admitting: Pulmonary Disease

## 2019-06-13 ENCOUNTER — Ambulatory Visit (INDEPENDENT_AMBULATORY_CARE_PROVIDER_SITE_OTHER): Payer: BC Managed Care – PPO | Admitting: Internal Medicine

## 2019-06-13 DIAGNOSIS — R05 Cough: Secondary | ICD-10-CM

## 2019-06-13 DIAGNOSIS — Z6841 Body Mass Index (BMI) 40.0 and over, adult: Secondary | ICD-10-CM

## 2019-06-13 DIAGNOSIS — R0600 Dyspnea, unspecified: Secondary | ICD-10-CM | POA: Diagnosis not present

## 2019-06-13 DIAGNOSIS — R058 Other specified cough: Secondary | ICD-10-CM

## 2019-06-13 DIAGNOSIS — R0609 Other forms of dyspnea: Secondary | ICD-10-CM

## 2019-06-13 NOTE — Patient Instructions (Addendum)
Try albuterol 15 min before an activity that you know would make you short of breath and see if it makes any difference and if makes none then don't take it after activity unless you can't catch your breath.  Prilosec 40 mg Take 30-60 min before first meal of the day   GERD (REFLUX)  is an extremely common cause of respiratory symptoms just like yours , many times with no obvious heartburn at all.    It can be treated with medication, but also with lifestyle changes including elevation of the head of your bed (ideally with 6 -8inch blocks under the headboard of your bed),  Smoking cessation, avoidance of late meals, excessive alcohol, and avoid fatty foods, chocolate, peppermint, colas, red wine, and acidic juices such as orange juice.  NO MINT OR MENTHOL PRODUCTS SO NO COUGH DROPS  USE SUGARLESS CANDY INSTEAD (Jolley ranchers or Stover's or Life Savers) or even ice chips will also do - the key is to swallow to prevent all throat clearing. NO OIL BASED VITAMINS - use powdered substitutes.  Avoid fish oil when coughing.  To get the most out of exercise, you need to be continuously aware that you are short of breath, but never out of breath, for 30 minutes daily. As you improve, it will actually be easier for you to do the same amount of exercise  in  30 minutes so always push to the level where you are short of breath.   If not happy with your improvement after you do rehab you can call me to arrange CPST.

## 2019-06-13 NOTE — Progress Notes (Addendum)
Samantha Greene, female    DOB: 11/15/1970,   MRN: [id number]   Brief patient profile:  1 yowf never smoker allergies all her life = spring rhinitis worst perennial pattern assoc with freq "bronchitis"  With URI's worst in 2015 and progressive doe >>  [facility] eval 10/01/2014 @ 205 dx R eventration   Feb 2020 R  HD plication no change in any symptoms   History of Present Illness  06/13/2019  Pulmonary/ 1st office eval/[facility]  Chief Complaint  Patient presents with  . Follow-up    pt states sob.pt states using rescue inhaler 3 to 4 times daily  Dyspnea:  About the same x 2016 / doing rehab now but sob across the lot / does not desat in rehab Mailbox 100 ft flat  Cough: dry most of the day, some in am Sleep: on side bed is flat  SABA use:3-4 x daily  02  domeier says needs 02 and cpap  No obvious day to day or daytime variability or assoc excess/ purulent sputum or mucus plugs or hemoptysis or cp or chest tightness, subjective wheeze or overt sinus or hb symptoms.    . Also denies any obvious fluctuation of symptoms with weather or environmental changes or other aggravating or alleviating factors except as outlined above   No unusual exposure hx or h/o childhood pna/ asthma or knowledge of premature birth.  Current Allergies, Complete Past Medical History, Past Surgical History, Family History, and Social History were reviewed in [employer] record.  ROS  The following are not active complaints unless bolded Hoarseness, sore throat, dysphagia, dental problems, itching, sneezing,  nasal congestion or discharge of excess mucus or purulent secretions, ear ache,   fever, chills, sweats, unintended wt loss or wt gain, classically pleuritic or exertional cp,  orthopnea pnd or arm/hand swelling  or leg swelling, presyncope, palpitations, abdominal pain, anorexia, nausea, vomiting, diarrhea  or change in bowel habits or change in bladder habits, change in stools or change  in urine, dysuria, hematuria,  rash, arthralgias, visual complaints, headache, numbness, weakness or ataxia or problems with walking or coordination,  change in mood or  memory.           Past Medical History:  Diagnosis Date  . Anxiety   . Arthritis   . Depression    no meds  . Diaphragmatic hernia with obstruction, without gangrene 11/07/2018  . Dyspnea    can happen anytime  . Family history of breast cancer   . Family history of colon cancer   . Family history of lung cancer   . Family history of pancreatic cancer   . Family history of prostate cancer   . GERD (gastroesophageal reflux disease)   . Hernia, diaphragmatic   . History of kidney stones   . Hyperlipidemia   . Hypertension    no meds  . Pneumonia    1 time    Outpatient Medications Prior to Visit  Medication Sig Dispense Refill  . albuterol (VENTOLIN HFA) 108 (90 Base) MCG/ACT inhaler INHALE 2 PUFFS INTO THE LUNGS EVERY 4 (FOUR) HOURS AS NEEDED FOR WHEEZING OR SHORTNESS OF BREATH. 18 g 2  . aspirin 81 MG tablet Take 81 mg by mouth daily.    . baclofen (LIORESAL) 10 MG tablet TAKE 1/2 TO 1 TABLET 1 OR 2 X /DAY AS NEEDED FOR MUSCLE SPASMS 180 tablet 1  . buPROPion (WELLBUTRIN XL) 300 MG 24 hr tablet Take 1 tablet every Morning for  Mood, Focus & Concentration 90 tablet 3  . cetirizine (ZYRTEC) 10 MG tablet Take 10 mg by mouth at bedtime.    . Cholecalciferol (VITAMIN D) 125 MCG (5000 UT) CAPS Take 5,000 Units by mouth daily.     Marland Kitchen escitalopram (LEXAPRO) 20 MG tablet Take 1 tablet Daily for Mood 90 tablet 3  . Eszopiclone 3 MG TABS TAKE 1 TABLET BY MOUTH AT BEDTIME AS NEEDED. TAKE IMMEDIATELY BEFORE BEDTIME 30 tablet 3  . fexofenadine (ALLEGRA) 180 MG tablet Take 180 mg by mouth daily as needed for allergies or rhinitis. Alternates with zyrtec when Zyrtec seems to not be as effective    . fluocinolone (SYNALAR) 0.025 % ointment APPLY TO AFFECTED AREA TWICE A DAY 30 g 0  .    1 each 0  . MELATONIN PO Take 10 mg by  mouth.    . meloxicam (MOBIC) 15 MG tablet Take 1/2 to 1 tablet Daily for Pain & Inflammation  (Limit to 4-5 tabs /week Tto Avoid Kidney Damage) 90 tablet 3  . montelukast (SINGULAIR) 10 MG tablet Take 1 tablet daily for Allergies 90 tablet 0  . nystatin cream (MYCOSTATIN) Apply 1 application topically 2 (two) times daily. (Patient taking differently: Apply 1 application topically 2 (two) times daily as needed for dry skin. ) 30 g 1  . omeprazole (PRILOSEC) 40 MG capsule TAKE 1 CAPSULE BY MOUTH EVERY DAY 90 capsule 3  . rosuvastatin (CRESTOR) 5 MG tablet Take 1 tablet (5 mg total) by mouth at bedtime. 90 tablet 3  . telmisartan (MICARDIS) 40 MG tablet TAKE 1 TABLET DAILY FOR BLOOD PRESSURE 90 tablet 3  . triamcinolone ointment (KENALOG) 0.1 % Apply 1 application topically 2 (two) times daily. (Patient taking differently: Apply 1 application topically daily as needed (rash). ) 80 g 1  . vitamin C (ASCORBIC ACID) 500 MG tablet Take 500 mg by mouth daily.    . vitamin E 600 UNIT capsule Take 600 Units by mouth daily.    . fluticasone (FLONASE) 50 MCG/ACT nasal spray Place 2 sprays into both nostrils daily. 16 g 2   No facility-administered medications prior to visit.     Objective:     BP 128/82 (BP Location: Right Arm, Cuff Size: Normal)   Pulse 81   Temp (!) 97.2 F (36.2 C) (Temporal)   Ht 5\' 1"  (1.549 m)   Wt 224 lb 3.2 oz (101.7 kg)   SpO2 98% Comment: room air  BMI 42.36 kg/m   SpO2: 98 %(room air)   Pleasant amb wf MO by BMI    HEENT : pt wearing mask not removed for exam due to covid -19 concerns.    NECK :  without JVD/Nodes/TM/ nl carotid upstrokes bilaterally   LUNGS: no acc muscle use,  Nl contour chest with decreased BS R base  without cough on insp or exp maneuvers   CV:  RRR  no s3 or murmur or increase in P2, and no edema   ABD:  soft and nontender with nl inspiratory excursion in the supine position. No bruits or organomegaly appreciated, bowel sounds  nl  MS:  Nl gait/ ext warm without deformities, calf tenderness, cyanosis or clubbing No obvious joint restrictions   SKIN: warm and dry without lesions    NEURO:  alert, approp, nl sensorium with  no motor or cerebellar deficits apparent.      I personally reviewed images and agree with radiology impression as follows:  CXR:   PA  and Lat 02/12/19  1. Stable elevation of the right hemidiaphragm with overlying vascular crowding and streaky atelectasis. 2. No acute pulmonary findings.    Labs  reviewed:      Chemistry      Component Value Date/Time   NA 137 03/18/2019 1104   K 4.4 03/18/2019 1104   CL 103 03/18/2019 1104   CO2 24 03/18/2019 1104   BUN 14 03/18/2019 1104   CREATININE 1.12 (H) 03/18/2019 1104      Component Value Date/Time   CALCIUM 9.5 03/18/2019 1104   ALKPHOS 58 03/25/2018 0221   AST 18 03/18/2019 1104   ALT 20 03/18/2019 1104   BILITOT 0.6 03/18/2019 1104        Lab Results  Component Value Date   WBC 5.5 03/18/2019   HGB 13.5 03/18/2019   HCT 40.0 03/18/2019   MCV 95.5 03/18/2019   PLT 287 03/18/2019        EOS                                                                88                                     03/18/19       Lab Results  Component Value Date   TSH 1.96 03/18/2019           Assessment   DOE (dyspnea on exertion) PFT's  10/07/14 at wt 205   FEV1 2.30 (87 % ) ratio 0.93  p 9 % improvement from saba p ? prior to study with DLCO  19.75 (97%) corrects to 6.06 (137%)  for alv volume and FV curve nl /  ERV 27% -Feb 2020 R  HD plication no change in any symptoms    Unfortunately the combination of R HD dysfunction and morbid obesity is double wammy and nothing to offer at this point but rehab and wt loss and if not making progress a CPST can be done p completes rehab.    Upper airway cough syndrome Onset 2015 with background of "allergies all her life" with pnds and neg response to BREO  Upper airway cough syndrome  (previously labeled PNDS),  is so named because it's frequently impossible to sort out how much is  CR/sinusitis with freq throat clearing (which can be related to primary GERD)   vs  causing  secondary (" extra esophageal")  GERD from wide swings in gastric pressure that occur with throat clearing, often  promoting self use of mint and menthol lozenges that reduce the lower esophageal sphincter tone and exacerbate the problem further in a cyclical fashion.   These are the same pts (now being labeled as having "irritable larynx syndrome" by some cough centers) who not infrequently have a history of having failed to tolerate ace inhibitors,  dry powder inhalers like BREO or biphosphonates or report having atypical/extraesophageal reflux symptoms that don't respond to standard doses of PPI  and are easily confused as having aecopd or asthma flares by even experienced allergists/ pulmonologists (myself included).   rec max rx for GERD/ Wt loss / continue singulair and leave off Breo   I see no  convincing evidence of asthma so advised: I spent extra time with pt today reviewing appropriate use of albuterol for prn use on exertion with the following points: 1) saba is for relief of sob that does not improve by walking a slower pace or resting but rather if the pt does not improve after trying this first. 2) If the pt is convinced, as many are, that saba helps recover from activity faster then it's easy to tell if this is the case by re-challenging : ie stop, take the inhaler, then p 5 minutes try the exact same activity (intensity of workload) that just caused the symptoms and see if they are substantially diminished or not after saba 3) if there is an activity that reproducibly causes the symptoms, try the saba 15 min before the activity on alternate days   If in fact the saba really does help, then fine to continue to use it prn but advised may need to look closer at the maintenance regimen being used to  achieve better control of airways disease with exertion.  - CPST can be done with spirometry before and after to sort out whether there is asthma component to doe complaints   - The proper method of use, as well as anticipated side effects, of a metered-dose inhaler were discussed and demonstrated to the patient.     Morbid obesity with body mass index (BMI) of 40.0 to 44.9 in adult [facility]) Complicated by OSA  Body mass index is 42.36 kg/m.    Lab Results  Component Value Date   TSH 1.96 03/18/2019    Contributing to gerd risk/ doe/reviewed the need and the process to achieve and maintain neg calorie balance > defer f/u primary care including intermittently monitoring thyroid status     Each maintenance medication was reviewed in detail including emphasizing most importantly the difference between maintenance and prns and under what circumstances the prns are to be triggered using an action plan format where appropriate.  Total time for H and P, chart review, counseling, teaching device and generating customized AVS unique to this office visit / charting = 40 min for new pt to me       Sandrea Hughs, MD 06/13/2019

## 2019-06-14 ENCOUNTER — Encounter: Payer: Self-pay | Admitting: Internal Medicine

## 2019-06-14 DIAGNOSIS — R058 Other specified cough: Secondary | ICD-10-CM | POA: Insufficient documentation

## 2019-06-14 NOTE — Assessment & Plan Note (Signed)
PFT's  10/07/14 at wt 205   FEV1 2.30 (87 % ) ratio 0.93  p 9 % improvement from saba p ? prior to study with DLCO  19.75 (97%) corrects to 6.06 (137%)  for alv volume and FV curve nl /  ERV 27% -Feb 2020 R  HD plication no change in any symptoms    Unfortunately the combination of R HD dysfunction and morbid obesity is double wammy and nothing to offer at this point but rehab and wt loss and if not making progress a CPST can be done p completes rehab.

## 2019-06-14 NOTE — Assessment & Plan Note (Signed)
Complicated by OSA  Body mass index is 42.36 kg/m.    Lab Results  Component Value Date   TSH 1.96 03/18/2019     Contributing to gerd risk/ doe/reviewed the need and the process to achieve and maintain neg calorie balance > defer f/u primary care including intermittently monitoring thyroid status

## 2019-06-14 NOTE — Assessment & Plan Note (Addendum)
Onset 2015 with background of "allergies all her life" with pnds and neg response to BREO  Upper airway cough syndrome (previously labeled PNDS),  is so named because it's frequently impossible to sort out how much is  CR/sinusitis with freq throat clearing (which can be related to primary GERD)   vs  causing  secondary (" extra esophageal")  GERD from wide swings in gastric pressure that occur with throat clearing, often  promoting self use of mint and menthol lozenges that reduce the lower esophageal sphincter tone and exacerbate the problem further in a cyclical fashion.   These are the same pts (now being labeled as having "irritable larynx syndrome" by some cough centers) who not infrequently have a history of having failed to tolerate ace inhibitors,  dry powder inhalers like BREO or biphosphonates or report having atypical/extraesophageal reflux symptoms that don't respond to standard doses of PPI  and are easily confused as having aecopd or asthma flares by even experienced allergists/ pulmonologists (myself included).   rec max rx for GERD/ Wt loss / continue singulair and leave off Breo   I see no convincing evidence of asthma so advised: I spent extra time with pt today reviewing appropriate use of albuterol for prn use on exertion with the following points: 1) saba is for relief of sob that does not improve by walking a slower pace or resting but rather if the pt does not improve after trying this first. 2) If the pt is convinced, as many are, that saba helps recover from activity faster then it's easy to tell if this is the case by re-challenging : ie stop, take the inhaler, then p 5 minutes try the exact same activity (intensity of workload) that just caused the symptoms and see if they are substantially diminished or not after saba 3) if there is an activity that reproducibly causes the symptoms, try the saba 15 min before the activity on alternate days   If in fact the saba really does  help, then fine to continue to use it prn but advised may need to look closer at the maintenance regimen being used to achieve better control of airways disease with exertion.  - CPST can be done with spirometry before and after to sort out whether there is asthma component to doe complaints   - The proper method of use, as well as anticipated side effects, of a metered-dose inhaler were discussed and demonstrated to the patient.              Each maintenance medication was reviewed in detail including emphasizing most importantly the difference between maintenance and prns and under what circumstances the prns are to be triggered using an action plan format where appropriate.  Total time for H and P, chart review, counseling, teaching device and generating customized AVS unique to this office visit / charting = 40 min for new pt to me

## 2019-06-16 NOTE — Progress Notes (Signed)
Follow up  Assessment and Plan:  Nipple anomaly -     doxycycline (VIBRAMYCIN) 100 MG capsule; Take 1 capsule twice daily with food -     MM DIAG BREAST TOMO UNI RIGHT; Future -     US BREAST LTD UNI RIGHT INC AXILLA; Future Want to treat the abscess first however with nipple changes will get diagnositic MGM and patient is due for screening MGM  Essential hypertension - continue medications, DASH diet, exercise and monitor at home. Call if greater than 130/80.  -     CBC with Differential/Platelet -     COMPLETE METABOLIC PANEL WITH GFR -     TSH  Mixed hyperlipidemia -     Lipid panel check lipids decrease fatty foods increase activity.   Morbid obesity (HCC) - follow up 3 months for progress monitoring - increase veggies, decrease carbs - long discussion about weight loss, diet, and exercise  Psoriasis, guttate Monitor  Vitamin D deficiency -     VITAMIN D 25 Hydroxy (Vit-D Deficiency, Fractures)  OSA on CPAP Continue to follow up Dr. Marylou Flesher- trying to get O2 added for OSA  Mild episode of recurrent major depressive disorder (HCC) Continue lexapro  Medication management -     Magnesium  Abnormal glucose -     Hemoglobin A1c  Dyspnea on exertion -     AMB referral to pulmonary rehabilitation -     albuterol (VENTOLIN HFA) 108 (90 Base) MCG/ACT inhaler; Inhale 2 puffs into the lungs every 4 (four) hours as needed for wheezing or shortness of breath. - continue follow up pulmonary, go back to pulmonary rehab - continue to try to weight loss   Discussed med's effects and SE's. Screening labs and tests as requested with regular follow-up as recommended.  HPI  49 y.o. female  presents for follow up for obestiy, HTN, SOB, OSA, chol and vitamin D def  Woke up with erythematous warm nodule on right upper chest/breast, no streaking, no fever, no chills. Has had some clear drainage from it.  Last MGM was 2017. She got last COVID vaccine April 12. She states her  right nipple has been dry and sore and is changing in shape x 3 weeks, has had possible discharge.   BMI is Body mass index is 42.14 kg/m., she is working on diet and exercise. Started on hello fresh.  Wt Readings from Last 3 Encounters:  06/17/19 223 lb (101.2 kg)  06/13/19 224 lb 3.2 oz (101.7 kg)  05/28/19 224 lb (101.6 kg)   She is s/p VATS 03/23/2018, was still having SOB with exertion or bening over and some right sided pain with bending over, she is following with Dr. Sherene Sires. She has not been doing pulmonary rehab due to being sick, will start back Wednesday.   She is on lexapro 20 mg and wellbutrin 300mg .    And had a sleep study that showed severe OSA with AHI 37.5/h and possible central sleep apnea characteristics.  and is now on a CPAP x Nov, uses [facility]. Has still been very tired during the day, 2 hour naps, has followed with Dr. Dec, will get O2 with CPAP.   Her blood pressure has been controlled at home, today their BP is BP: 124/72.  She does workout. She denies chest pain, shortness of breath, dizziness.   She is on cholesterol medication and denies myalgias. Her cholesterol is at goal. The cholesterol last visit was:  Lab Results  Component Value Date  CHOL 174 03/18/2019   HDL 69 03/18/2019   LDLCALC 82 03/18/2019   TRIG 136 03/18/2019   CHOLHDL 2.5 03/18/2019  . She has been working on diet and exercise for prediabetes, she is on bASA, she is on ACE/ARB and denies foot ulcerations, hyperglycemia, hypoglycemia , increased appetite, nausea, paresthesia of the feet, polydipsia, polyuria, visual disturbances, vomiting and weight loss. Last A1C in the office was:  Lab Results  Component Value Date   HGBA1C 5.6 03/18/2019   Patient is on Vitamin D supplement, she is on 5000 daily.   Lab Results  Component Value Date   VD25OH 42 03/18/2019     She has recurrent sinus injections, going to call Dr. Ezzard Standing to try get nasal surgery- has been 3 years.   Current  Medications:    Current Outpatient Medications (Cardiovascular):  .  rosuvastatin (CRESTOR) 5 MG tablet, Take 1 tablet (5 mg total) by mouth at bedtime. Marland Kitchen  telmisartan (MICARDIS) 40 MG tablet, TAKE 1 TABLET DAILY FOR BLOOD PRESSURE  Current Outpatient Medications (Respiratory):  .  albuterol (VENTOLIN HFA) 108 (90 Base) MCG/ACT inhaler, INHALE 2 PUFFS INTO THE LUNGS EVERY 4 (FOUR) HOURS AS NEEDED FOR WHEEZING OR SHORTNESS OF BREATH. .  cetirizine (ZYRTEC) 10 MG tablet, Take 10 mg by mouth at bedtime. .  fexofenadine (ALLEGRA) 180 MG tablet, Take 180 mg by mouth daily as needed for allergies or rhinitis. Alternates with zyrtec when Zyrtec seems to not be as effective .  montelukast (SINGULAIR) 10 MG tablet, Take 1 tablet daily for Allergies .  fluticasone (FLONASE) 50 MCG/ACT nasal spray, Place 2 sprays into both nostrils daily.  Current Outpatient Medications (Analgesics):  .  aspirin 81 MG tablet, Take 81 mg by mouth daily. .  meloxicam (MOBIC) 15 MG tablet, Take 1/2 to 1 tablet Daily for Pain & Inflammation  (Limit to 4-5 tabs /week Tto Avoid Kidney Damage)   Current Outpatient Medications (Other):  .  baclofen (LIORESAL) 10 MG tablet, TAKE 1/2 TO 1 TABLET 1 OR 2 X /DAY AS NEEDED FOR MUSCLE SPASMS .  buPROPion (WELLBUTRIN XL) 300 MG 24 hr tablet, Take 1 tablet every Morning for Mood, Focus & Concentration .  Cholecalciferol (VITAMIN D) 125 MCG (5000 UT) CAPS, Take 5,000 Units by mouth daily.  Marland Kitchen  escitalopram (LEXAPRO) 20 MG tablet, Take 1 tablet Daily for Mood .  Eszopiclone 3 MG TABS, TAKE 1 TABLET BY MOUTH AT BEDTIME AS NEEDED. TAKE IMMEDIATELY BEFORE BEDTIME .  fluocinolone (SYNALAR) 0.025 % ointment, APPLY TO AFFECTED AREA TWICE A DAY .  MELATONIN PO, Take 10 mg by mouth. .  nystatin cream (MYCOSTATIN), Apply 1 application topically 2 (two) times daily. (Patient taking differently: Apply 1 application topically 2 (two) times daily as needed for dry skin. ) .  omeprazole (PRILOSEC)  40 MG capsule, TAKE 1 CAPSULE BY MOUTH EVERY DAY .  triamcinolone ointment (KENALOG) 0.1 %, Apply 1 application topically 2 (two) times daily. (Patient taking differently: Apply 1 application topically daily as needed (rash). ) .  vitamin C (ASCORBIC ACID) 500 MG tablet, Take 500 mg by mouth daily. .  vitamin E 600 UNIT capsule, Take 600 Units by mouth daily. Marland Kitchen  doxycycline (VIBRAMYCIN) 100 MG capsule, Take 1 capsule twice daily with food  Medical History:  Past Medical History:  Diagnosis Date  . Anxiety   . Arthritis   . Depression    no meds  . Diaphragmatic hernia with obstruction, without gangrene 11/07/2018  . Dyspnea  can happen anytime  . Family history of breast cancer   . Family history of colon cancer   . Family history of lung cancer   . Family history of pancreatic cancer   . Family history of prostate cancer   . GERD (gastroesophageal reflux disease)   . Hernia, diaphragmatic   . History of kidney stones   . Hyperlipidemia   . Hypertension    no meds  . Pneumonia    1 time   Allergies Allergies  Allergen Reactions  . Erythromycin Nausea Only and Other (See Comments)    Stomach cramping (can take Z Pak)  . Flexeril [Cyclobenzaprine Hcl] Other (See Comments)    "don't like the way I feel"  . Metformin And Related Other (See Comments)    GI Upset    SURGICAL HISTORY She  has a past surgical history that includes Dilation and evacuation; Dilation and curettage of uterus; Video assisted thoracoscopy (vats)/thorocotomy (Right, 03/23/2018); epigastric hernia repair (N/A, 03/23/2018); and IR THORACENTESIS ASP PLEURAL SPACE W/IMG GUIDE (04/19/2018).   FAMILY HISTORY Her family history includes Breast cancer in her cousin and maternal aunt; Breast cancer (age of onset: 70) in her mother; Cervical cancer in her paternal aunt; Colon cancer in her maternal aunt, maternal aunt, maternal uncle, and maternal uncle; Colon polyps in her maternal aunt and mother; Congestive  Heart Failure in her mother; Diabetes in her brother, maternal aunt, maternal uncle, and mother; Heart disease in her maternal aunt, maternal grandmother, and maternal uncle; Hemochromatosis in her paternal uncle; Hyperlipidemia in her brother, maternal aunt, maternal uncle, and mother; Hypertension in her brother, maternal aunt, maternal uncle, and mother; Kidney disease in her mother; Lung cancer in her mother; Ovarian cancer in her paternal aunt; Pancreatic cancer in her maternal aunt; Polycystic ovary syndrome in her paternal aunt; Prostate cancer in her cousin, cousin, and maternal uncle; Stroke in her maternal aunt and maternal uncle; Throat cancer in her maternal aunt.   SOCIAL HISTORY She  reports that she has never smoked. She has never used smokeless tobacco. She reports current alcohol use. She reports that she does not use drugs.  Review of Systems: See HPI  Physical Exam: Estimated body mass index is 42.14 kg/m as calculated from the following:   Height as of this encounter: 5\' 1"  (1.549 m).   Weight as of this encounter: 223 lb (101.2 kg). BP 124/72   Pulse 60   Temp (!) 97.5 F (36.4 C)   Ht 5\' 1"  (1.549 m)   Wt 223 lb (101.2 kg)   SpO2 97%   BMI 42.14 kg/m   General Appearance: Well nourished well developed, in no apparent distress.  Eyes: PERRLA, EOMs, conjunctiva no swelling or erythema ENT/Mouth: Ear canals normal without obstruction, swelling, erythema, or discharge.  TMs normal bilaterally with no erythema, bulging, retraction, or loss of landmark.  Oropharynx moist and clear with no exudate, erythema, or swelling.  Neck: Supple, thyroid normal. No bruits.  No cervical adenopathy Respiratory: Respiratory effort normal, Breath sounds clear A&P without wheeze, rhonchi, rales.   Cardio: RRR without murmurs, rubs or gallops. Brisk peripheral pulses without edema.  Chest: symmetric, with normal excursions Breasts: Breasts: left breast normal without mass, skin or nipple  changes or axillary nodes, right breast with 3x2 cm tender warm, erythematous area on right medial superior breast, no fluctuance. Her right nipple is scaly, slightly erythematous without discharge. No palpable nodules.  Abdomen: Soft, nontender, no guarding, rebound, hernias, masses, or organomegaly.  Lymphatics: Non tender without lymphadenopathy.  Musculoskeletal: Full ROM all peripheral extremities,5/5 strength, and normal gait.  Skin: Warm, dry without rashes, lesions, ecchymosis. Neuro: Awake and oriented X 3, Cranial nerves intact, reflexes equal bilaterally. Normal muscle tone, no cerebellar symptoms.  Psych:  normal affect, Insight and Judgment appropriate.     Vicie Mutters 12:37 PM [facility]

## 2019-06-17 ENCOUNTER — Emergency Department (HOSPITAL_COMMUNITY)
Admission: EM | Admit: 2019-06-17 | Discharge: 2019-06-18 | Disposition: A | Discharge status: Home / Self Care | Payer: BC Managed Care – PPO | Attending: Emergency Medicine | Admitting: Emergency Medicine

## 2019-06-17 ENCOUNTER — Encounter (HOSPITAL_COMMUNITY): Payer: Self-pay

## 2019-06-17 ENCOUNTER — Other Ambulatory Visit: Payer: Self-pay

## 2019-06-17 ENCOUNTER — Encounter: Payer: Self-pay | Admitting: Physician Assistant

## 2019-06-17 ENCOUNTER — Encounter: Payer: Self-pay | Admitting: *Deleted

## 2019-06-17 ENCOUNTER — Ambulatory Visit: Payer: BC Managed Care – PPO | Admitting: Physician Assistant

## 2019-06-17 VITALS — BP 124/72 | HR 60 | Temp 97.5°F | Ht 61.0 in | Wt 223.0 lb

## 2019-06-17 DIAGNOSIS — Q839 Congenital malformation of breast, unspecified: Secondary | ICD-10-CM

## 2019-06-17 DIAGNOSIS — F330 Major depressive disorder, recurrent, mild: Secondary | ICD-10-CM | POA: Diagnosis not present

## 2019-06-17 DIAGNOSIS — I10 Essential (primary) hypertension: Secondary | ICD-10-CM

## 2019-06-17 DIAGNOSIS — N61 Mastitis without abscess: Secondary | ICD-10-CM | POA: Diagnosis not present

## 2019-06-17 DIAGNOSIS — R52 Pain, unspecified: Secondary | ICD-10-CM | POA: Diagnosis not present

## 2019-06-17 DIAGNOSIS — R0602 Shortness of breath: Secondary | ICD-10-CM | POA: Diagnosis not present

## 2019-06-17 DIAGNOSIS — E559 Vitamin D deficiency, unspecified: Secondary | ICD-10-CM

## 2019-06-17 DIAGNOSIS — Z79899 Other long term (current) drug therapy: Secondary | ICD-10-CM

## 2019-06-17 DIAGNOSIS — Z9989 Dependence on other enabling machines and devices: Secondary | ICD-10-CM

## 2019-06-17 DIAGNOSIS — R0609 Other forms of dyspnea: Secondary | ICD-10-CM

## 2019-06-17 DIAGNOSIS — E782 Mixed hyperlipidemia: Secondary | ICD-10-CM | POA: Diagnosis not present

## 2019-06-17 DIAGNOSIS — R7309 Other abnormal glucose: Secondary | ICD-10-CM

## 2019-06-17 DIAGNOSIS — G4489 Other headache syndrome: Secondary | ICD-10-CM | POA: Diagnosis not present

## 2019-06-17 DIAGNOSIS — T7840XA Allergy, unspecified, initial encounter: Secondary | ICD-10-CM | POA: Insufficient documentation

## 2019-06-17 DIAGNOSIS — Z7982 Long term (current) use of aspirin: Secondary | ICD-10-CM | POA: Diagnosis not present

## 2019-06-17 DIAGNOSIS — G4733 Obstructive sleep apnea (adult) (pediatric): Secondary | ICD-10-CM

## 2019-06-17 LAB — CBC WITH DIFFERENTIAL/PLATELET
Abs Immature Granulocytes: 0.02 10*3/uL (ref 0.00–0.07)
Basophils Absolute: 0 10*3/uL (ref 0.0–0.1)
Basophils Relative: 0 %
Eosinophils Absolute: 0.1 10*3/uL (ref 0.0–0.5)
Eosinophils Relative: 1 %
HCT: 38 % (ref 36.0–46.0)
Hemoglobin: 12.6 g/dL (ref 12.0–15.0)
Immature Granulocytes: 0 %
Lymphocytes Relative: 15 %
Lymphs Abs: 1.3 10*3/uL (ref 0.7–4.0)
MCH: 32.5 pg (ref 26.0–34.0)
MCHC: 33.2 g/dL (ref 30.0–36.0)
MCV: 97.9 fL (ref 80.0–100.0)
Monocytes Absolute: 0.3 10*3/uL (ref 0.1–1.0)
Monocytes Relative: 4 %
Neutro Abs: 6.8 10*3/uL (ref 1.7–7.7)
Neutrophils Relative %: 80 %
Platelets: 242 10*3/uL (ref 150–400)
RBC: 3.88 MIL/uL (ref 3.87–5.11)
RDW: 13.1 % (ref 11.5–15.5)
WBC: 8.5 10*3/uL (ref 4.0–10.5)
nRBC: 0 % (ref 0.0–0.2)

## 2019-06-17 MED ORDER — DIPHENHYDRAMINE HCL 50 MG/ML IJ SOLN
25.0000 mg | Freq: Once | INTRAMUSCULAR | Status: AC
  Administered 2019-06-17: 25 mg via INTRAVENOUS
  Filled 2019-06-17: qty 1

## 2019-06-17 MED ORDER — DOXYCYCLINE HYCLATE 100 MG PO CAPS: ORAL_CAPSULE | ORAL | 0 refills | Status: DC

## 2019-06-17 NOTE — ED Provider Notes (Signed)
[facility]-EMERGENCY DEPT Provider Note   CSN: [id number] Arrival date & time: 06/17/19  2223     History Chief Complaint  Patient presents with  . Allergic Reaction    doxycycline     Samantha Greene is a 49 y.o. female presented to emergency department concern for possible allergic reaction.  The patient reports that she was started on doxycycline today for swelling and pain in her right breast by her PCP.  The first time taken doxycycline.  She took her first dose around 3 PM.  She reports that around 7 or 8 PM, while sitting on the couch watching TV, she began to experience sudden onset restless legs, jitteriness, which then morphed into sudden stiffness in her neck, difficulty opening her jaw, and headache.  She said this lasted approximately 15 minutes.  She felt like she was having a hard time breathing and her chest was tightening up.  Her husband drove her to the nearest EMS station, where she was then transferred by ambulance to the emergency department.  Since arriving the ED she feels completely back to baseline, aside from some soreness in her back.  Denies rash.  This is never happened to her before.  She denies any fevers at home. No hx of anaphylaxis.  HPI     Past Medical History:  Diagnosis Date  . Anxiety   . Arthritis   . Depression    no meds  . Diaphragmatic hernia with obstruction, without gangrene 11/07/2018  . Dyspnea    can happen anytime  . Family history of breast cancer   . Family history of colon cancer   . Family history of lung cancer   . Family history of pancreatic cancer   . Family history of prostate cancer   . GERD (gastroesophageal reflux disease)   . Hernia, diaphragmatic   . History of kidney stones   . Hyperlipidemia   . Hypertension    no meds  . Pneumonia    1 time    Patient Active Problem List   Diagnosis Date Noted  . Upper airway cough syndrome 06/14/2019  . Severe sleep apnea 05/28/2019  .  CPAP (continuous positive airway pressure) dependence 05/28/2019  . Diaphragmatic hernia with obstruction, without gangrene 05/28/2019  . Morbid obesity with body mass index (BMI) of 40.0 to 44.9 in adult [facility]) 05/28/2019  . Persistent hypersomnia 05/28/2019  . Abnormal glucose 03/17/2019  . OSA on CPAP 12/11/2018  . Chronic insomnia 11/07/2018  . Class 2 obesity with alveolar hypoventilation, serious comorbidity, and body mass index (BMI) of 38.0 to 38.9 in adult (HCC) 11/07/2018  . DOE (dyspnea on exertion) 11/07/2018  . S/P Thoracotomy with Plication of Diaphragm 03/23/2018  . Genetic testing 03/19/2018  . Family history of breast cancer   . Family history of colon cancer   . Family history of prostate cancer   . Family history of lung cancer   . Family history of pancreatic cancer   . Carpal tunnel syndrome on both sides 06/29/2017  . Morbid obesity (HCC) 06/28/2017  . Psoriasis, guttate 02/22/2017  . Vitamin D deficiency 10/24/2016  . Dyspnea 10/01/2014  . Depression   . Hyperlipidemia   . Hypertension   . Anxiety   . Insomnia 03/02/2011    Past Surgical History:  Procedure Laterality Date  . DILATION AND CURETTAGE OF UTERUS     x3  . DILATION AND EVACUATION    . EPIGASTRIC HERNIA REPAIR N/A 03/23/2018  Procedure: PLICATION OF DIAPHRAGM;  Surgeon: Melrose Nakayama, MD;  Location: [facility];  Service: Thoracic;  Laterality: N/A;  . IR THORACENTESIS ASP PLEURAL SPACE W/IMG GUIDE  04/19/2018  . VIDEO ASSISTED THORACOSCOPY (VATS)/THOROCOTOMY Right 03/23/2018   Procedure: VIDEO ASSISTED THORACOSCOPY;  Surgeon: Melrose Nakayama, MD;  Location: [facility] OR;  Service: Thoracic;  Laterality: Right;     OB History    Gravida  1   Para  0   Term      Preterm      AB  0   Living  0     SAB      TAB      Ectopic  0   Multiple      Live Births              Family History  Problem Relation Age of Onset  . Breast cancer Mother 65  . Lung cancer Mother   .  Hypertension Mother   . Diabetes Mother   . Kidney disease Mother   . Hyperlipidemia Mother   . Congestive Heart Failure Mother   . Colon polyps Mother        "multiple"  . Breast cancer Maternal Aunt        dx 50+  . Diabetes Maternal Aunt   . Hyperlipidemia Maternal Aunt   . Heart disease Maternal Aunt   . Hypertension Maternal Aunt   . Stroke Maternal Aunt   . Colon cancer Maternal Aunt        dx 67+  . Colon polyps Maternal Aunt        multiple  . Hyperlipidemia Brother   . Hypertension Brother   . Diabetes Brother   . Colon cancer Maternal Uncle        dx 26+  . Diabetes Maternal Uncle   . Hyperlipidemia Maternal Uncle   . Heart disease Maternal Uncle   . Hypertension Maternal Uncle   . Stroke Maternal Uncle   . Hemochromatosis Paternal Uncle   . Heart disease Maternal Grandmother        Needed pacemaker  . Polycystic ovary syndrome Paternal Aunt   . Colon cancer Maternal Uncle        dx 42+  . Breast cancer Cousin        double mastectomy  . Prostate cancer Cousin   . Prostate cancer Cousin   . Pancreatic cancer Maternal Aunt        dx 50+  . Throat cancer Maternal Aunt        dx 6+  . Colon cancer Maternal Aunt        dx 60+  . Ovarian cancer Paternal Aunt   . Cervical cancer Paternal Aunt   . Prostate cancer Maternal Uncle        dx 50+    Social History   Tobacco Use  . Smoking status: Never Smoker  . Smokeless tobacco: Never Used  Substance Use Topics  . Alcohol use: Yes    Alcohol/week: 0.0 standard drinks    Comment: social events  . Drug use: No    Home Medications Prior to Admission medications   Medication Sig Start Date End Date Taking? Authorizing Provider  albuterol (VENTOLIN HFA) 108 (90 Base) MCG/ACT inhaler INHALE 2 PUFFS INTO THE LUNGS EVERY 4 (FOUR) HOURS AS NEEDED FOR WHEEZING OR SHORTNESS OF BREATH. 06/12/19   Liane Comber, NP  aspirin 81 MG tablet Take 81 mg by mouth daily.    [provider]  baclofen (LIORESAL)  10 MG tablet TAKE 1/2 TO 1 TABLET 1 OR 2 X /DAY AS NEEDED FOR MUSCLE SPASMS 05/14/19   Lucky Cowboy, MD  buPROPion (WELLBUTRIN XL) 300 MG 24 hr tablet Take 1 tablet every Morning for Mood, Focus & Concentration 09/26/18   Lucky Cowboy, MD  cetirizine (ZYRTEC) 10 MG tablet Take 10 mg by mouth at bedtime.    [provider]  Cholecalciferol (VITAMIN D) 125 MCG (5000 UT) CAPS Take 5,000 Units by mouth daily.     [provider]  clindamycin (CLEOCIN) 150 MG capsule Take 3 capsules (450 mg total) by mouth 3 (three) times daily for 7 days. 06/18/19 06/25/19  Terald Sleeper, MD  doxycycline (VIBRAMYCIN) 100 MG capsule Take 1 capsule twice daily with food 06/17/19   Quentin Mulling, PA-C  escitalopram (LEXAPRO) 20 MG tablet Take 1 tablet Daily for Mood 03/02/19   Lucky Cowboy, MD  Eszopiclone 3 MG TABS TAKE 1 TABLET BY MOUTH AT BEDTIME AS NEEDED. TAKE IMMEDIATELY BEFORE BEDTIME 03/18/19   Quentin Mulling, PA-C  fexofenadine (ALLEGRA) 180 MG tablet Take 180 mg by mouth daily as needed for allergies or rhinitis. Alternates with zyrtec when Zyrtec seems to not be as effective    [provider]  fluocinolone (SYNALAR) 0.025 % ointment APPLY TO AFFECTED AREA TWICE A DAY 08/18/18   Lucky Cowboy, MD  fluticasone [facility]) 50 MCG/ACT nasal spray Place 2 sprays into both nostrils daily. 05/13/14 02/12/19  Loree Fee, PA-C  MELATONIN PO Take 10 mg by mouth.    [provider]  meloxicam (MOBIC) 15 MG tablet Take 1/2 to 1 tablet Daily for Pain & Inflammation  (Limit to 4-5 tabs /week Tto Avoid Kidney Damage) 01/23/19   Lucky Cowboy, MD  montelukast (SINGULAIR) 10 MG tablet Take 1 tablet daily for Allergies 05/24/19   Lucky Cowboy, MD  nystatin cream (MYCOSTATIN) Apply 1 application topically 2 (two) times daily. Patient taking differently: Apply 1 application topically 2 (two) times daily as needed for dry skin.  02/22/17   Quentin Mulling, PA-C  omeprazole  (PRILOSEC) 40 MG capsule TAKE 1 CAPSULE BY MOUTH EVERY DAY 12/12/18   Quentin Mulling, PA-C  rosuvastatin (CRESTOR) 5 MG tablet Take 1 tablet (5 mg total) by mouth at bedtime. 09/11/18 09/11/19  Quentin Mulling, PA-C  telmisartan (MICARDIS) 40 MG tablet TAKE 1 TABLET DAILY FOR BLOOD PRESSURE 03/13/19   Lucky Cowboy, MD  triamcinolone ointment (KENALOG) 0.1 % Apply 1 application topically 2 (two) times daily. Patient taking differently: Apply 1 application topically daily as needed (rash).  02/22/17   Quentin Mulling, PA-C  vitamin C (ASCORBIC ACID) 500 MG tablet Take 500 mg by mouth daily.    [provider]  vitamin E 600 UNIT capsule Take 600 Units by mouth daily.    [provider]    Allergies    Doxycycline, Erythromycin, Flexeril [cyclobenzaprine hcl], and Metformin and related  Review of Systems   Review of Systems  Constitutional: Negative for chills and fever.  Eyes: Negative for pain and visual disturbance.  Respiratory: Positive for chest tightness and shortness of breath.   Cardiovascular: Negative for chest pain and palpitations.  Gastrointestinal: Negative for abdominal pain and vomiting.  Musculoskeletal: Positive for arthralgias and myalgias.  Skin: Negative for color change and rash.  Neurological: Positive for headaches. Negative for syncope.  Psychiatric/Behavioral: Negative for agitation and confusion.  All other systems reviewed and are negative.   Physical Exam Updated Vital  Signs BP 114/75   Pulse 83   Temp 98.5 F (36.9 C)   Resp 14   Ht 5\' 1"  (1.549 m)   Wt 100 kg   SpO2 97%   BMI 41.66 kg/m   Physical Exam Vitals and nursing note reviewed.  Constitutional:      General: She is not in acute distress.    Appearance: She is well-developed.  HENT:     Head: Normocephalic and atraumatic.  Eyes:     Conjunctiva/sclera: Conjunctivae normal.  Cardiovascular:     Rate and Rhythm: Normal rate and regular rhythm.     Pulses: Normal  pulses.     Heart sounds: No murmur.  Pulmonary:     Effort: Pulmonary effort is normal. No respiratory distress.     Breath sounds: Normal breath sounds.     Comments: Oropharynx non-erythematous.  No tonsillar swelling or exudate.  No uvular deviation.  No drooling. No brawny edema. No stridor. Voice is not muffled. Abdominal:     Palpations: Abdomen is soft.     Tenderness: There is no abdominal tenderness.  Musculoskeletal:     Cervical back: Neck supple.  Skin:    General: Skin is warm and dry.     Comments: Small penny sized region of induration and erythema at 2 o clock position in upper breast  Neurological:     General: No focal deficit present.     Mental Status: She is alert and oriented to person, place, and time.  Psychiatric:        Mood and Affect: Mood normal.        Behavior: Behavior normal.     ED Results / Procedures / Treatments   Labs (all labs ordered are listed, but only abnormal results are displayed) Labs Reviewed  BASIC METABOLIC PANEL - Abnormal; Notable for the following components:      Result Value   Glucose, Bld 107 (*)    All other components within normal limits  CBC WITH DIFFERENTIAL/PLATELET  MAGNESIUM    EKG None  Radiology No results found.  Procedures Procedures (including critical care time)  Medications Ordered in ED Medications  diphenhydrAMINE (BENADRYL) injection 25 mg (25 mg Intravenous Given 06/17/19 2314)    ED Course  I have reviewed the triage vital signs and the nursing notes.  Pertinent labs & imaging results that were available during my care of the patient were reviewed by me and considered in my medical decision making (see chart for details).  49 year old female presented emergency department with episode of myalgia, neck pain along the jaw, lasting approximately 15 minutes, occurring 2 hours prior to arrival.  She reports beginning a new medication today which was doxycycline for possible skin or breast  infection.  She did take this medication 4 hours prior to her episode.  Differential includes allergic reaction versus a dystonic reaction versus electrolyte derangement (eg calcium, potassium) vs. Other  She is afebrile and well-appearing on arrival.  I doubt that she is septic.  She has no other localizing symptoms.  We will give her dose of IV Benadryl.  We will check her electrolytes.  We will consider switching her to clindamycin as I cannot exclude that she had a reaction to doxycycline.  She is overall stable with no evidence of airway swelling.  I do not believe she needs epinephrine at this time  Clinical Course as of Jun 17 121  Tue Jun 18, 2019  0032 She remains asymptomatic.  Her husband  is now at the bedside.  He tells me that he thinks he may have had a panic attack.  He says whenever she gets agitated and in pain she often stop speaking, and he noted that she was breathing very heavy and very quickly after her initial episode of tremors and neck pain.  This is definitely possibility.  However, to be safe, I recommended switching her over to clindamycin.     [MT]    Clinical Course User Index [MT] Trifan, Kermit Balo, MD    Final Clinical Impression(s) / ED Diagnoses Final diagnoses:  Allergic reaction, initial encounter  Breast infection    Rx / DC Orders ED Discharge Orders         Ordered    clindamycin (CLEOCIN) 150 MG capsule  3 times daily     06/18/19 0046           Terald Sleeper, MD 06/18/19 [phone]

## 2019-06-17 NOTE — ED Triage Notes (Signed)
Patient in from home after what she believes is an allergic reaction to her doxycycline, states she was just placed on meds due to an infection on rt breast, about 1xhr ago she began to experience restless, sob, neck stiffness and pain across abdomen.

## 2019-06-17 NOTE — Patient Instructions (Addendum)
HOW TO SCHEDULE A MAMMOGRAM  [facility]  7 a.m.-6:30 p.m., Monday 7 a.m.-5 p.m., Tuesday-Friday Schedule an appointment by calling [phone].   General eating tips  What to Avoid . Avoid added sugars o Often added sugar can be found in processed foods such as many condiments, dry cereals, cakes, cookies, chips, crisps, crackers, candies, sweetened drinks, etc.  o Read labels and AVOID/DECREASE use of foods with the following in their ingredient list: Sugar, fructose, high fructose corn syrup, sucrose, glucose, maltose, dextrose, molasses, cane sugar, brown sugar, any type of syrup, agave nectar, etc.   . Avoid snacking in between meals- drink water or if you feel you need a snack, pick a high water content snack such as cucumbers, watermelon, or any veggie.  Marland Kitchen Avoid foods made with flour o If you are going to eat food made with flour, choose those made with whole-grains; and, minimize your consumption as much as is tolerable . Avoid processed foods o These foods are generally stocked in the middle of the grocery store.  o Focus on shopping on the perimeter of the grocery.  What to Include . Vegetables o GREEN LEAFY VEGETABLES: Kale, spinach, mustard greens, collard greens, cabbage, broccoli, etc. o OTHER: Asparagus, cauliflower, eggplant, carrots, peas, Brussel sprouts, tomatoes, bell peppers, zucchini, beets, cucumbers, etc. . Grains, seeds, and legumes o Beans: kidney beans, black eyed peas, garbanzo beans, black beans, pinto beans, etc. o Whole, unrefined grains: brown rice, barley, bulgur, oatmeal, etc. . Healthy fats  o Avoid highly processed fats such as vegetable oil o Examples of healthy fats: avocado, olives, virgin olive oil, dark chocolate (?72% Cocoa), nuts (peanuts, almonds, walnuts, cashews, pecans, etc.) o Please still do small amount of these healthy fats, they are dense in calories.  . Low - Moderate Intake of Animal Sources of  Protein o Meat sources: chicken, [place], salmon, tuna. Limit to 4 ounces of meat at one time or the size of your palm. o Consider limiting dairy sources, but when choosing dairy focus on: PLAIN [place] yogurt, cottage cheese, high-protein milk . Fruit o Choose berries    Drink 1/2 your body weight in fluid ounces of water daily; drink a tall glass of water 30 min before meals  Don't eat until you're stuffed- listen to your stomach and eat until you are 80% full   Try eating off of a salad plate; wait 10 min after finishing before going back for seconds  Start by eating the vegetables on your plate; aim for 50% of your meals to be fruits or vegetables  Then eat your protein - lean meats (grass fed if possible), fish, beans, nuts in moderation  Eat your carbs/starch last ONLY if you still are hungry. If you can, stop before finishing it all  Avoid sugar and flour - the closer it looks to it's original form in nature, typically the better it is for you  Splurge in moderation - "assign" days when you get to splurge and have the "bad stuff" - I like to follow a 80% - 20% plan- "good" choices 80 % of the time, "bad" choices in moderation 20% of the time  Simple equation is: Calories out > calories in = weight loss - even if you eat the bad stuff, if you limit portions, you will still lose weight    [facility] requires you to complete a free scheduled weight loss seminar or webinar before being  scheduled for a consultation.   Please go to the link below to register for a free weight loss seminar or webinar. or call [phone].  Once completed you will be given instructions for the next steps.   [facility] at [facility]:   [email or web address]      Phone:  [phone]  The 2 different weight loss surgeries are that I can discuss with you is the Gastric sleeve or the gastric  bypass.   You will have to see a nutritionist and psychiatrist before and after any of these surgeries.  They help decrease your hunger but they do not fix your relationship with food or unhealthy habits.  In order for these surgeries to be most successful and for you to keep the weight off you have to work on those aspects as well or the weight will come back.   They are both done laparoscopically, these means there is not a large incision, it is done with tiny incisions made in your abdomen and then a scope is inserted. This helps significantly with recovery and healing time. Very rarely would they have to switch to an open surgery but that is one of the potential complications.   Gastric sleeve The rate of complication with the gastric sleeve is about 1 %.  Weight loss with the gastric sleeve is about 60-80lbs.  80% of your stomach is removed but your intestines remain the where they are.  The part of your stomach that is removed release hunger hormones so this surgery causes you to be less hungry as well not be able to hold a large amount of food in your stomach.  This one has less nutritionally issues such at vitamin absorption after the surgery than the gastric bypass.  Gastric Bypass The rate of complication with the gastric bypass is about 2-3% Weight loss with the gastric bypass is 100 lbs or more A large portion of your stomach is removed so that it is only the shape of an egg, then part of your intestines is bypassed.  This decreases hunger hormones and if you have diabetes is almost "cured" right after surgery, we are still studying why that is the case and it will hopefully lead to future treatments for diabetes.  Since we are bypassing part of the intestines where absorption of vitamins occur requires you to take vitamins daily for the rest of your life  You have to be more focused on protein and water intake  You need to adjust your eating habits after the surgery drastically  afterwards but it has the most weight loss associated with it.

## 2019-06-18 ENCOUNTER — Other Ambulatory Visit: Payer: Self-pay | Admitting: Physician Assistant

## 2019-06-18 LAB — COMPLETE METABOLIC PANEL WITH GFR
AG Ratio: 1.8 (calc) (ref 1.0–2.5)
ALT: 26 U/L (ref 6–29)
AST: 22 U/L (ref 10–35)
Albumin: 4.4 g/dL (ref 3.6–5.1)
Alkaline phosphatase (APISO): 69 U/L (ref 31–125)
BUN: 14 mg/dL (ref 7–25)
CO2: 29 mmol/L (ref 20–32)
Calcium: 9.5 mg/dL (ref 8.6–10.2)
Chloride: 103 mmol/L (ref 98–110)
Creat: 0.95 mg/dL (ref 0.50–1.10)
GFR, Est African American: 82 mL/min/{1.73_m2} (ref >=60)
GFR, Est Non African American: 70 mL/min/{1.73_m2} (ref >=60)
Globulin: 2.4 g/dL (calc) (ref 1.9–3.7)
Glucose, Bld: 91 mg/dL (ref 65–99)
Potassium: 4.9 mmol/L (ref 3.5–5.3)
Sodium: 139 mmol/L (ref 135–146)
Total Bilirubin: 0.7 mg/dL (ref 0.2–1.2)
Total Protein: 6.8 g/dL (ref 6.1–8.1)

## 2019-06-18 LAB — HEMOGLOBIN A1C
Hgb A1c MFr Bld: 5.5 % of total Hgb (ref <5.7)
Mean Plasma Glucose: 111 (calc)
eAG (mmol/L): 6.2 (calc)

## 2019-06-18 LAB — MAGNESIUM
Magnesium: 2.1 mg/dL (ref 1.5–2.5)
Magnesium: 1.9 mg/dL (ref 1.7–2.4)

## 2019-06-18 LAB — CBC WITH DIFFERENTIAL/PLATELET
Absolute Monocytes: 505 cells/uL (ref 200–950)
Basophils Absolute: 31 cells/uL (ref 0–200)
Basophils Relative: 0.6 %
Eosinophils Absolute: 168 cells/uL (ref 15–500)
Eosinophils Relative: 3.3 %
HCT: 37.7 % (ref 35.0–45.0)
Hemoglobin: 12.8 g/dL (ref 11.7–15.5)
Lymphs Abs: 1719 cells/uL (ref 850–3900)
MCH: 31.6 pg (ref 27.0–33.0)
MCHC: 34 g/dL (ref 32.0–36.0)
MCV: 93.1 fL (ref 80.0–100.0)
MPV: 9.7 fL (ref 7.5–12.5)
Monocytes Relative: 9.9 %
Neutro Abs: 2678 cells/uL (ref 1500–7800)
Neutrophils Relative %: 52.5 %
Platelets: 276 10*3/uL (ref 140–400)
RBC: 4.05 10*6/uL (ref 3.80–5.10)
RDW: 12.3 % (ref 11.0–15.0)
Total Lymphocyte: 33.7 %
WBC: 5.1 10*3/uL (ref 3.8–10.8)

## 2019-06-18 LAB — BASIC METABOLIC PANEL
Anion gap: 9 (ref 5–15)
BUN: 15 mg/dL (ref 6–20)
CO2: 25 mmol/L (ref 22–32)
Calcium: 9.2 mg/dL (ref 8.9–10.3)
Chloride: 106 mmol/L (ref 98–111)
Creatinine, Ser: 0.87 mg/dL (ref 0.44–1.00)
GFR calc Af Amer: >60 mL/min (ref >60)
GFR calc non Af Amer: >60 mL/min (ref >60)
Glucose, Bld: 107 mg/dL — ABNORMAL HIGH (ref 70–99)
Potassium: 3.8 mmol/L (ref 3.5–5.1)
Sodium: 140 mmol/L (ref 135–145)

## 2019-06-18 LAB — LIPID PANEL
Cholesterol: 168 mg/dL (ref <200)
HDL: 65 mg/dL (ref >=50)
LDL Cholesterol (Calc): 79 mg/dL (calc)
Non-HDL Cholesterol (Calc): 103 mg/dL (calc) (ref <130)
Total CHOL/HDL Ratio: 2.6 (calc) (ref <5.0)
Triglycerides: 143 mg/dL (ref <150)

## 2019-06-18 LAB — TSH: TSH: 3 mIU/L

## 2019-06-18 LAB — VITAMIN D 25 HYDROXY (VIT D DEFICIENCY, FRACTURES): Vit D, 25-Hydroxy: 47 ng/mL (ref 30–100)

## 2019-06-18 MED ORDER — CLINDAMYCIN HCL 150 MG PO CAPS: 450.0000 mg | ORAL_CAPSULE | Freq: Three times a day (TID) | ORAL | 0 refills | Status: AC

## 2019-06-18 NOTE — ED Notes (Signed)
Pt ambulated to the bathroom with no assistance. Gait steady  

## 2019-06-18 NOTE — Discharge Instructions (Addendum)
It is possible that he experienced an allergic reaction tonight. It is not entirely clear at this time.  However, I felt it was safe to stop the doxycycline and start you on a different antibiotic, called clindamycin, to cover for your skin and breast infection.  If you ever experience a similar reaction, he should take 25 mg of Benadryl at home. If you continue to have symptoms, if you have any respiratory difficulties or problems breathing, you should call 911 to come back to the ER.  Please call your doctor's office tomorrow to schedule follow-up appointment the next day or 2.

## 2019-06-19 ENCOUNTER — Encounter: Payer: BC Managed Care – PPO | Admitting: *Deleted

## 2019-06-19 ENCOUNTER — Other Ambulatory Visit: Payer: Self-pay

## 2019-06-19 ENCOUNTER — Encounter: Payer: Self-pay | Admitting: *Deleted

## 2019-06-19 DIAGNOSIS — R0600 Dyspnea, unspecified: Secondary | ICD-10-CM | POA: Diagnosis not present

## 2019-06-19 DIAGNOSIS — Z79899 Other long term (current) drug therapy: Secondary | ICD-10-CM | POA: Diagnosis not present

## 2019-06-19 DIAGNOSIS — F419 Anxiety disorder, unspecified: Secondary | ICD-10-CM | POA: Diagnosis not present

## 2019-06-19 DIAGNOSIS — E785 Hyperlipidemia, unspecified: Secondary | ICD-10-CM | POA: Diagnosis not present

## 2019-06-19 DIAGNOSIS — Z7982 Long term (current) use of aspirin: Secondary | ICD-10-CM | POA: Diagnosis not present

## 2019-06-19 DIAGNOSIS — F329 Major depressive disorder, single episode, unspecified: Secondary | ICD-10-CM | POA: Diagnosis not present

## 2019-06-19 DIAGNOSIS — R0609 Other forms of dyspnea: Secondary | ICD-10-CM

## 2019-06-19 DIAGNOSIS — I10 Essential (primary) hypertension: Secondary | ICD-10-CM | POA: Diagnosis not present

## 2019-06-19 NOTE — Progress Notes (Signed)
Daily Session Note  Patient Details  Name: Samantha Greene MRN: [id number] Date of Birth: 1970-10-25 Referring Provider:     Pulmonary Rehab from 10/11/2018 in [facility]  Referring Provider  [place]      Encounter Date: 06/19/2019  Check In: Session Check In - 06/19/19 1711      Check-In   Supervising physician immediately available to respond to emergencies  See telemetry face sheet for immediately available ER MD    Location  [facility] & [facility]    Staff Present  [name], RN Vickki Hearing, BA, ACSM CEP, Exercise Physiologist;Melissa Caiola RDN, LDN    Virtual Visit  No    Medication changes reported      Yes    Comments  on clindamycin    Fall or balance concerns reported     No    Warm-up and Cool-down  Performed on first and last piece of equipment    Resistance Training Performed  Yes    VAD Patient?  No    PAD/SET Patient?  No      Pain Assessment   Currently in Pain?  No/denies          Social History   Tobacco Use  Smoking Status Never Smoker  Smokeless Tobacco Never Used    Goals Met:  Independence with exercise equipment Exercise tolerated well No report of cardiac concerns or symptoms Strength training completed today  Goals Unmet:  Not Applicable  Comments: Pt able to follow exercise prescription today without complaint.  Will continue to monitor for progression.    Dr. Emily Filbert is Medical Director for [facility].

## 2019-06-19 NOTE — Progress Notes (Signed)
Pulmonary Individual Treatment Plan  Patient Details  Name: Samantha Greene MRN: [id number] Date of Birth: 1970-06-25 Referring Provider:     Pulmonary Rehab from 10/11/2018 in [facility]  Referring Provider  [place]      Initial Encounter Date:    Pulmonary Rehab from 10/11/2018 in [facility]  Date  10/11/18      Visit Diagnosis: DOE (dyspnea on exertion)  Patient's Home Medications on Admission:  Current Outpatient Medications:  .  albuterol (VENTOLIN HFA) 108 (90 Base) MCG/ACT inhaler, INHALE 2 PUFFS INTO THE LUNGS EVERY 4 (FOUR) HOURS AS NEEDED FOR WHEEZING OR SHORTNESS OF BREATH., Disp: 18 g, Rfl: 2 .  aspirin 81 MG tablet, Take 81 mg by mouth daily., Disp: , Rfl:  .  baclofen (LIORESAL) 10 MG tablet, TAKE 1/2 TO 1 TABLET 1 OR 2 X /DAY AS NEEDED FOR MUSCLE SPASMS, Disp: 180 tablet, Rfl: 1 .  buPROPion (WELLBUTRIN XL) 300 MG 24 hr tablet, Take 1 tablet every Morning for Mood, Focus & Concentration, Disp: 90 tablet, Rfl: 3 .  cetirizine (ZYRTEC) 10 MG tablet, Take 10 mg by mouth at bedtime., Disp: , Rfl:  .  Cholecalciferol (VITAMIN D) 125 MCG (5000 UT) CAPS, Take 5,000 Units by mouth daily. , Disp: , Rfl:  .  clindamycin (CLEOCIN) 150 MG capsule, Take 3 capsules (450 mg total) by mouth 3 (three) times daily for 7 days., Disp: 63 capsule, Rfl: 0 .  doxycycline (VIBRAMYCIN) 100 MG capsule, Take 1 capsule twice daily with food, Disp: 20 capsule, Rfl: 0 .  escitalopram (LEXAPRO) 20 MG tablet, Take 1 tablet Daily for Mood, Disp: 90 tablet, Rfl: 3 .  Eszopiclone 3 MG TABS, TAKE 1 TABLET BY MOUTH AT BEDTIME AS NEEDED. TAKE IMMEDIATELY BEFORE BEDTIME, Disp: 30 tablet, Rfl: 3 .  fexofenadine (ALLEGRA) 180 MG tablet, Take 180 mg by mouth daily as needed for allergies or rhinitis. Alternates with zyrtec when Zyrtec seems to not be as effective, Disp: , Rfl:  .  fluocinolone (SYNALAR) 0.025 % ointment, APPLY TO AFFECTED AREA TWICE A DAY, Disp: 30  g, Rfl: 0 .  fluticasone (FLONASE) 50 MCG/ACT nasal spray, Place 2 sprays into both nostrils daily., Disp: 16 g, Rfl: 2 .  MELATONIN PO, Take 10 mg by mouth., Disp: , Rfl:  .  meloxicam (MOBIC) 15 MG tablet, Take 1/2 to 1 tablet Daily for Pain & Inflammation  (Limit to 4-5 tabs /week Tto Avoid Kidney Damage), Disp: 90 tablet, Rfl: 3 .  montelukast (SINGULAIR) 10 MG tablet, Take 1 tablet daily for Allergies, Disp: 90 tablet, Rfl: 0 .  nystatin cream (MYCOSTATIN), Apply 1 application topically 2 (two) times daily. (Patient taking differently: Apply 1 application topically 2 (two) times daily as needed for dry skin. ), Disp: 30 g, Rfl: 1 .  omeprazole (PRILOSEC) 40 MG capsule, TAKE 1 CAPSULE BY MOUTH EVERY DAY, Disp: 90 capsule, Rfl: 3 .  rosuvastatin (CRESTOR) 5 MG tablet, TAKE 1 TABLET BY MOUTH EVERYDAY AT BEDTIME, Disp: 90 tablet, Rfl: 3 .  telmisartan (MICARDIS) 40 MG tablet, TAKE 1 TABLET DAILY FOR BLOOD PRESSURE, Disp: 90 tablet, Rfl: 3 .  triamcinolone ointment (KENALOG) 0.1 %, Apply 1 application topically 2 (two) times daily. (Patient taking differently: Apply 1 application topically daily as needed (rash). ), Disp: 80 g, Rfl: 1 .  vitamin C (ASCORBIC ACID) 500 MG tablet, Take 500 mg by mouth daily., Disp: , Rfl:  .  vitamin E 600  UNIT capsule, Take 600 Units by mouth daily., Disp: , Rfl:   Past Medical History: Past Medical History:  Diagnosis Date  . Anxiety   . Arthritis   . Depression    no meds  . Diaphragmatic hernia with obstruction, without gangrene 11/07/2018  . Dyspnea    can happen anytime  . Family history of breast cancer   . Family history of colon cancer   . Family history of lung cancer   . Family history of pancreatic cancer   . Family history of prostate cancer   . GERD (gastroesophageal reflux disease)   . Hernia, diaphragmatic   . History of kidney stones   . Hyperlipidemia   . Hypertension    no meds  . Pneumonia    1 time    Tobacco Use: Social  History   Tobacco Use  Smoking Status Never Smoker  Smokeless Tobacco Never Used    Labs: Recent Review Flowsheet Data    Labs for ITP Cardiac and Pulmonary Rehab Latest Ref Rng & Units 03/24/2018 09/11/2018 12/12/2018 03/18/2019 06/17/2019   Cholestrol <200 mg/dL - 226(H) 170 174 168   LDLCALC mg/dL (calc) - 132(H) 78 82 79   HDL > OR = 50 mg/dL - 62 63 69 65   Trlycerides <150 mg/dL - 188(H) 194(H) 136 143   Hemoglobin A1c <5.7 % of total Hgb - 5.6 5.4 5.6 5.5   PHART 7.350 - 7.450 7.361 - - - -   PCO2ART 32.0 - 48.0 mmHg 45.9 - - - -   HCO3 20.0 - 28.0 mmol/L 25.4 - - - -   ACIDBASEDEF 0.0 - 2.0 mmol/L - - - - -   O2SAT % 96.7 - - - -       Pulmonary Assessment Scores: Pulmonary Assessment Scores    Row Name 04/09/19 1513         ADL UCSD   SOB Score total  62     Rest  2     Walk  4     Stairs  5     Bath  2     Dress  2     Shop  2       CAT Score   CAT Score  25       mMRC Score   mMRC Score  2        UCSD: Self-administered rating of dyspnea associated with activities of daily living (ADLs) 6-point scale (0 = "not at all" to 5 = "maximal or unable to do because of breathlessness")  Scoring Scores range from 0 to 120.  Minimally important difference is 5 units  CAT: CAT can identify the health impairment of COPD patients and is better correlated with disease progression.  CAT has a scoring range of zero to 40. The CAT score is classified into four groups of low (less than 10), medium (10 - 20), high (21-30) and very high (31-40) based on the impact level of disease on health status. A CAT score over 10 suggests significant symptoms.  A worsening CAT score could be explained by an exacerbation, poor medication adherence, poor inhaler technique, or progression of COPD or comorbid conditions.  CAT MCID is 2 points  mMRC: mMRC (Modified Medical Research Council) Dyspnea Scale is used to assess the degree of baseline functional disability in patients of  respiratory disease due to dyspnea. No minimal important difference is established. A decrease in score of 1 point or greater is considered  a positive change.   Pulmonary Function Assessment:   Exercise Target Goals: Exercise Program Goal: Individual exercise prescription set using results from initial 6 min walk test and THRR while considering  patient's activity barriers and safety.   Exercise Prescription Goal: Initial exercise prescription builds to 30-45 minutes a day of aerobic activity, 2-3 days per week.  Home exercise guidelines will be given to patient during program as part of exercise prescription that the participant will acknowledge.  Education: Aerobic Exercise & Resistance Training: - Gives group verbal and written instruction on the various components of exercise. Focuses on aerobic and resistive training programs and the benefits of this training and how to safely progress through these programs..   Education: Exercise & Equipment Safety: - Individual verbal instruction and demonstration of equipment use and safety with use of the equipment.   Pulmonary Rehab from 10/11/2018 in [facility]  Date  10/11/18  Educator  AS  Instruction Review Code  1- Verbalizes Understanding      Education: Exercise Physiology & General Exercise Guidelines: - Group verbal and written instruction with models to review the exercise physiology of the cardiovascular system and associated critical values. Provides general exercise guidelines with specific guidelines to those with heart or lung disease.    Education: Flexibility, Balance, Mind/Body Relaxation: Provides group verbal/written instruction on the benefits of flexibility and balance training, including mind/body exercise modes such as yoga, pilates and tai chi.  Demonstration and skill practice provided.   Activity Barriers & Risk Stratification: Activity Barriers & Cardiac Risk Stratification - 04/03/19 1218       Activity Barriers & Cardiac Risk Stratification   Activity Barriers  Deconditioning;Muscular Weakness;Shortness of Breath;History of Falls       6 Minute Walk: 6 Minute Walk    Row Name 04/09/19 1504         6 Minute Walk   Phase  Initial     Distance  1200 feet     Walk Time  6 minutes     # of Rest Breaks  0     MPH  2.27     METS  3.37     RPE  17     Perceived Dyspnea   3     VO2 Peak  11.81     Symptoms  No     Resting HR  79 bpm     Resting BP  136/84     Resting Oxygen Saturation   97 %     Exercise Oxygen Saturation  during 6 min walk  94 %     Max Ex. HR  105 bpm     Max Ex. BP  164/82     2 Minute Post BP  138/86       Interval HR   1 Minute HR  92     2 Minute HR  100     3 Minute HR  103     4 Minute HR  105     5 Minute HR  104     6 Minute HR  103     2 Minute Post HR  86     Interval Heart Rate?  Yes       Interval Oxygen   Interval Oxygen?  Yes     Baseline Oxygen Saturation %  97 %     1 Minute Oxygen Saturation %  98 %     1 Minute Liters of Oxygen  0 L     2 Minute Oxygen Saturation %  94 %     2 Minute Liters of Oxygen  0 L     3 Minute Oxygen Saturation %  95 %     3 Minute Liters of Oxygen  0 L     4 Minute Oxygen Saturation %  95 %     4 Minute Liters of Oxygen  0 L     5 Minute Oxygen Saturation %  95 %     5 Minute Liters of Oxygen  0 L     6 Minute Oxygen Saturation %  96 %     6 Minute Liters of Oxygen  0 L     2 Minute Post Oxygen Saturation %  99 %     2 Minute Post Liters of Oxygen  0 L       Oxygen Initial Assessment: Oxygen Initial Assessment - 04/03/19 1221      Home Oxygen   Home Oxygen Device  None    Sleep Oxygen Prescription  CPAP    Home Exercise Oxygen Prescription  None    Home at Rest Exercise Oxygen Prescription  None    Compliance with Home Oxygen Use  Yes      Initial 6 min Walk   Oxygen Used  None      Program Oxygen Prescription   Program Oxygen Prescription  None      Intervention   Short  Term Goals  To learn and exhibit compliance with exercise, home and travel O2 prescription;To learn and understand importance of monitoring SPO2 with pulse oximeter and demonstrate accurate use of the pulse oximeter.;To learn and understand importance of maintaining oxygen saturations>88%;To learn and demonstrate proper pursed lip breathing techniques or other breathing techniques.;To learn and demonstrate proper use of respiratory medications    Long  Term Goals  Exhibits compliance with exercise, home and travel O2 prescription;Verbalizes importance of monitoring SPO2 with pulse oximeter and return demonstration;Maintenance of O2 saturations>88%;Exhibits proper breathing techniques, such as pursed lip breathing or other method taught during program session;Compliance with respiratory medication;Demonstrates proper use of MDI's       Oxygen Re-Evaluation: Oxygen Re-Evaluation    Row Name 04/10/19 1107 05/24/19 1116 05/27/19 1019         Program Oxygen Prescription   Program Oxygen Prescription  None  None  None       Home Oxygen   Home Oxygen Device  None  None  None     Sleep Oxygen Prescription  CPAP  CPAP  CPAP     Liters per minute  --  6  0     Home Exercise Oxygen Prescription  None  None  None     Home at Rest Exercise Oxygen Prescription  None  None  None     Compliance with Home Oxygen Use  Yes  Yes  Yes       Goals/Expected Outcomes   Short Term Goals  To learn and exhibit compliance with exercise, home and travel O2 prescription;To learn and understand importance of monitoring SPO2 with pulse oximeter and demonstrate accurate use of the pulse oximeter.;To learn and understand importance of maintaining oxygen saturations>88%;To learn and demonstrate proper pursed lip breathing techniques or other breathing techniques.;To learn and demonstrate proper use of respiratory medications  To learn and exhibit compliance with exercise, home and travel O2 prescription;To learn and  understand importance of monitoring SPO2 with pulse oximeter and demonstrate accurate  use of the pulse oximeter.;To learn and understand importance of maintaining oxygen saturations>88%;To learn and demonstrate proper pursed lip breathing techniques or other breathing techniques.;To learn and demonstrate proper use of respiratory medications  To learn and exhibit compliance with exercise, home and travel O2 prescription;To learn and demonstrate proper pursed lip breathing techniques or other breathing techniques.;To learn and demonstrate proper use of respiratory medications;To learn and understand importance of monitoring SPO2 with pulse oximeter and demonstrate accurate use of the pulse oximeter.     Long  Term Goals  Exhibits compliance with exercise, home and travel O2 prescription;Verbalizes importance of monitoring SPO2 with pulse oximeter and return demonstration;Maintenance of O2 saturations>88%;Exhibits proper breathing techniques, such as pursed lip breathing or other method taught during program session;Compliance with respiratory medication;Demonstrates proper use of MDI's  Exhibits compliance with exercise, home and travel O2 prescription;Verbalizes importance of monitoring SPO2 with pulse oximeter and return demonstration;Maintenance of O2 saturations>88%;Exhibits proper breathing techniques, such as pursed lip breathing or other method taught during program session;Compliance with respiratory medication;Demonstrates proper use of MDI's  Exhibits compliance with exercise, home and travel O2 prescription;Verbalizes importance of monitoring SPO2 with pulse oximeter and return demonstration;Exhibits proper breathing techniques, such as pursed lip breathing or other method taught during program session;Compliance with respiratory medication;Demonstrates proper use of MDI's     Comments  Reviewed PLB technique with pt.  Talked about how it works and it's importance in maintaining their exercise  saturations.  Talking to doctor about BIPAP or diphibulator machine. Reviewed PLB and diaphramic breathing.  She does have a pulse oximeter to check her oxygen saturation at home. Explained why it is important to have one. Reviewed that oxygen saturations should be 88 percent and above. Patient has her smart watch to check oxygen and heart rate.     Goals/Expected Outcomes  Short: Become more profiecient at using PLB.   Long: Become independent at using PLB.  Short: Become more profiecient at using PLB.   Long: Become independent at using PLB.  Short: monitor oxygen at home with exertion. Long: maintain oxygen saturations above 88 percent independently.        Oxygen Discharge (Final Oxygen Re-Evaluation): Oxygen Re-Evaluation - 05/27/19 1019      Program Oxygen Prescription   Program Oxygen Prescription  None      Home Oxygen   Home Oxygen Device  None    Sleep Oxygen Prescription  CPAP    Liters per minute  0    Home Exercise Oxygen Prescription  None    Home at Rest Exercise Oxygen Prescription  None    Compliance with Home Oxygen Use  Yes      Goals/Expected Outcomes   Short Term Goals  To learn and exhibit compliance with exercise, home and travel O2 prescription;To learn and demonstrate proper pursed lip breathing techniques or other breathing techniques.;To learn and demonstrate proper use of respiratory medications;To learn and understand importance of monitoring SPO2 with pulse oximeter and demonstrate accurate use of the pulse oximeter.    Long  Term Goals  Exhibits compliance with exercise, home and travel O2 prescription;Verbalizes importance of monitoring SPO2 with pulse oximeter and return demonstration;Exhibits proper breathing techniques, such as pursed lip breathing or other method taught during program session;Compliance with respiratory medication;Demonstrates proper use of MDI's    Comments  She does have a pulse oximeter to check her oxygen saturation at home. Explained  why it is important to have one. Reviewed that oxygen saturations should be 88 percent  and above. Patient has her smart watch to check oxygen and heart rate.    Goals/Expected Outcomes  Short: monitor oxygen at home with exertion. Long: maintain oxygen saturations above 88 percent independently.       Initial Exercise Prescription: Initial Exercise Prescription - 04/09/19 1500      Treadmill   MPH  2.2    Grade  1.5    Minutes  15    METs  3.1      Recumbant Bike   Level  2    RPM  50    Watts  45    Minutes  15    METs  3.1      Arm Ergometer   Level  1    RPM  30    Minutes  15    METs  2.5      Elliptical   Level  1    Speed  3    Minutes  15      Intensity   THRR 40-80% of Max Heartrate  116-153    Ratings of Perceived Exertion  11-15    Perceived Dyspnea  0-4      Resistance Training   Training Prescription  Yes    Weight  3 lb    Reps  10-15       Perform Capillary Blood Glucose checks as needed.  Exercise Prescription Changes: Exercise Prescription Changes    Row Name 04/11/19 1200 05/07/19 1500 05/22/19 1100 06/03/19 1400       Response to Exercise   Blood Pressure (Admit)  130/78  136/78  124/82  118/78    Blood Pressure (Exercise)  170/82  152/82  162/80  180/98    Blood Pressure (Exit)  116/84  132/70  126/74  132/78    Heart Rate (Admit)  72 bpm  86 bpm  83 bpm  94 bpm    Heart Rate (Exercise)  101 bpm  120 bpm  122 bpm  116 bpm    Heart Rate (Exit)  95 bpm  98 bpm  100 bpm  104 bpm    Oxygen Saturation (Admit)  98 %  98 %  95 %  97 %    Oxygen Saturation (Exercise)  95 %  94 %  94 %  95 %    Oxygen Saturation (Exit)  96 %  98 %  96 %  94 %    Rating of Perceived Exertion (Exercise)  16  15  14  15     Perceived Dyspnea (Exercise)  3  3  2  3     Symptoms  none  none  none  none    Duration  Continue with 30 min of aerobic exercise without signs/symptoms of physical distress.  Continue with 30 min of aerobic exercise without signs/symptoms of  physical distress.  Continue with 30 min of aerobic exercise without signs/symptoms of physical distress.  Continue with 30 min of aerobic exercise without signs/symptoms of physical distress.    Intensity  THRR unchanged  THRR unchanged  THRR unchanged  THRR unchanged      Progression   Progression  --  Continue to progress workloads to maintain intensity without signs/symptoms of physical distress.  Continue to progress workloads to maintain intensity without signs/symptoms of physical distress.  Continue to progress workloads to maintain intensity without signs/symptoms of physical distress.    Average METs  --  2.8  3.07  3.4      Resistance Training  Training Prescription  Yes  Yes  Yes  Yes    Weight  3 lb  3 lb  3 lb  3 lb    Reps  10-15  10-15  10-15  10-15      Interval Training   Interval Training  No  No  No  No      Recumbant Bike   Level  2  3  3  5     RPM  50  50  --  50    Watts  15  --  30  36    Minutes  15  15  15  15     METs  2.47  3.39  3.31  3      Arm Ergometer   Level  1  1  1  1     RPM  30  30  --  30    Minutes  15  15  15  15     METs  2.8  2.2  3.2  3.4       Exercise Comments:   Exercise Goals and Review: Exercise Goals    Row Name 04/09/19 1519             Exercise Goals   Increase Physical Activity  Yes       Intervention  Provide advice, education, support and counseling about physical activity/exercise needs.;Develop an individualized exercise prescription for aerobic and resistive training based on initial evaluation findings, risk stratification, comorbidities and participant's personal goals.       Expected Outcomes  Short Term: Attend rehab on a regular basis to increase amount of physical activity.;Long Term: Add in home exercise to make exercise part of routine and to increase amount of physical activity.;Long Term: Exercising regularly at least 3-5 days a week.       Increase Strength and Stamina  Yes       Intervention  Provide  advice, education, support and counseling about physical activity/exercise needs.;Develop an individualized exercise prescription for aerobic and resistive training based on initial evaluation findings, risk stratification, comorbidities and participant's personal goals.       Expected Outcomes  Short Term: Increase workloads from initial exercise prescription for resistance, speed, and METs.;Short Term: Perform resistance training exercises routinely during rehab and add in resistance training at home;Long Term: Improve cardiorespiratory fitness, muscular endurance and strength as measured by increased METs and functional capacity (6MWT)       Able to understand and use rate of perceived exertion (RPE) scale  Yes       Intervention  Provide education and explanation on how to use RPE scale       Expected Outcomes  Short Term: Able to use RPE daily in rehab to express subjective intensity level;Long Term:  Able to use RPE to guide intensity level when exercising independently       Able to understand and use Dyspnea scale  Yes       Intervention  Provide education and explanation on how to use Dyspnea scale       Expected Outcomes  Short Term: Able to use Dyspnea scale daily in rehab to express subjective sense of shortness of breath during exertion;Long Term: Able to use Dyspnea scale to guide intensity level when exercising independently       Knowledge and understanding of Target Heart Rate Range (THRR)  Yes       Intervention  Provide education and explanation of THRR including how the numbers were  predicted and where they are located for reference       Expected Outcomes  Short Term: Able to state/look up THRR;Short Term: Able to use daily as guideline for intensity in rehab;Long Term: Able to use THRR to govern intensity when exercising independently       Able to check pulse independently  Yes       Intervention  Provide education and demonstration on how to check pulse in carotid and radial  arteries.;Review the importance of being able to check your own pulse for safety during independent exercise       Expected Outcomes  Short Term: Able to explain why pulse checking is important during independent exercise;Long Term: Able to check pulse independently and accurately       Understanding of Exercise Prescription  Yes       Intervention  Provide education, explanation, and written materials on patient's individual exercise prescription       Expected Outcomes  Short Term: Able to explain program exercise prescription;Long Term: Able to explain home exercise prescription to exercise independently          Exercise Goals Re-Evaluation : Exercise Goals Re-Evaluation    Row Name 04/10/19 1105 04/11/19 1300 04/24/19 0933 05/07/19 1540 05/13/19 1011     Exercise Goal Re-Evaluation   Exercise Goals Review  Increase Physical Activity;Increase Strength and Stamina;Understanding of Exercise Prescription;Able to understand and use Dyspnea scale;Able to understand and use rate of perceived exertion (RPE) scale;Knowledge and understanding of Target Heart Rate Range (THRR)  Increase Physical Activity;Increase Strength and Stamina;Able to understand and use rate of perceived exertion (RPE) scale;Able to understand and use Dyspnea scale;Knowledge and understanding of Target Heart Rate Range (THRR);Able to check pulse independently  --  Increase Physical Activity;Increase Strength and Stamina;Able to understand and use rate of perceived exertion (RPE) scale;Able to understand and use Dyspnea scale;Knowledge and understanding of Target Heart Rate Range (THRR);Able to check pulse independently;Understanding of Exercise Prescription  Increase Physical Activity;Increase Strength and Stamina;Able to understand and use rate of perceived exertion (RPE) scale;Able to understand and use Dyspnea scale;Knowledge and understanding of Target Heart Rate Range (THRR);Able to check pulse independently;Understanding of  Exercise Prescription   Comments  Reviewed RPE scale, THR and program prescription with pt today.  Pt voiced understanding and was given a copy of goals to take home.  --  Out sick since last review.  Seth is just returning after being ill.  Osha is using her 5 lb weights at home.  This EP will give info on [employer]   Expected Outcomes  Short: Use RPE daily to regulate intensity. Long: Follow program prescription in THR.  --  --  Short : build back up after being ill Long: build overall stamina  Short: consider walking at the mall or water aerobics  Long:  exercise 3-5 days per week   Row Name 05/22/19 1008 05/24/19 1113 05/27/19 1016 06/03/19 1427 06/17/19 1438     Exercise Goal Re-Evaluation   Exercise Goals Review  Increase Physical Activity;Increase Strength and Stamina;Understanding of Exercise Prescription  Increase Physical Activity;Increase Strength and Stamina;Understanding of Exercise Prescription  Increase Physical Activity;Increase Strength and Stamina;Understanding of Exercise Prescription  Increase Physical Activity;Increase Strength and Stamina;Able to understand and use rate of perceived exertion (RPE) scale;Able to understand and use Dyspnea scale;Knowledge and understanding of Target Heart Rate Range (THRR);Able to check pulse independently;Understanding of Exercise Prescription  --   Comments  [name] is doing well in rehab.  She  is excited to be back and enjoys getting in her exericse.  She is doing 2.8 mph on treadmill.  We will continue to monitor her progress.  [name] is doing well in rehab.  She is excited to be back and enjoys getting in her exericse.  5 lb weights at home 1x/week 10 minutes. Walking at home for 30 minutes 2x/week trail with hills: breathing gets difficult, not able to talk during hills, will stop to catch breath.  She is not doing well with exercise at home and would like to start going to [employer] [facility]. Her doctor states that she does not want  her to go due to covid. She is due for her second vaccine soon and will ask her doctor if she will be able to go after her vaccince.  [name] is up to level 5 on the XR.  Staff will encourage increasing weight strength training.  Out since last review. Last attended on 4/30   Expected Outcomes  Short: Increase workload on XR  Long: Continue to improve stamina.  Short: working towards 1 hour for walking at home  Long: Continue to improve stamina.  Short: ask doctor if she can go back to the gym after her vaccine. Long: Start going to the gym 2 days a week.  Short: increase to 4-5 lb for strength work Long: improve overall MET level and stamina  --      Discharge Exercise Prescription (Final Exercise Prescription Changes): Exercise Prescription Changes - 06/03/19 1400      Response to Exercise   Blood Pressure (Admit)  118/78    Blood Pressure (Exercise)  180/98    Blood Pressure (Exit)  132/78    Heart Rate (Admit)  94 bpm    Heart Rate (Exercise)  116 bpm    Heart Rate (Exit)  104 bpm    Oxygen Saturation (Admit)  97 %    Oxygen Saturation (Exercise)  95 %    Oxygen Saturation (Exit)  94 %    Rating of Perceived Exertion (Exercise)  15    Perceived Dyspnea (Exercise)  3    Symptoms  none    Duration  Continue with 30 min of aerobic exercise without signs/symptoms of physical distress.    Intensity  THRR unchanged      Progression   Progression  Continue to progress workloads to maintain intensity without signs/symptoms of physical distress.    Average METs  3.4      Resistance Training   Training Prescription  Yes    Weight  3 lb    Reps  10-15      Interval Training   Interval Training  No      Recumbant Bike   Level  5    RPM  50    Watts  36    Minutes  15    METs  3      Arm Ergometer   Level  1    RPM  30    Minutes  15    METs  3.4       Nutrition:  Target Goals: Understanding of nutrition guidelines, daily intake of sodium <1552m, cholesterol <2072m  calories 30% from fat and 7% or less from saturated fats, daily to have 5 or more servings of fruits and vegetables.  Education: Controlling Sodium/Reading Food Labels -Group verbal and written material supporting the discussion of sodium use in heart healthy nutrition. Review and explanation with models, verbal and written materials for  utilization of the food label.   Education: General Nutrition Guidelines/Fats and Fiber: -Group instruction provided by verbal, written material, models and posters to present the general guidelines for heart healthy nutrition. Gives an explanation and review of dietary fats and fiber.   Biometrics:    Nutrition Therapy Plan and Nutrition Goals:   Nutrition Assessments:   MEDIFICTS Score Key:          ?70 Need to make dietary changes          40-70 Heart Healthy Diet         ? 40 Therapeutic Level Cholesterol Diet  Nutrition Goals Re-Evaluation:   Nutrition Goals Discharge (Final Nutrition Goals Re-Evaluation):   Psychosocial: Target Goals: Acknowledge presence or absence of significant depression and/or stress, maximize coping skills, provide positive support system. Participant is able to verbalize types and ability to use techniques and skills needed for reducing stress and depression.   Education: Depression - Provides group verbal and written instruction on the correlation between heart/lung disease and depressed mood, treatment options, and the stigmas associated with seeking treatment.   Education: Sleep Hygiene -Provides group verbal and written instruction about how sleep can affect your health.  Define sleep hygiene, discuss sleep cycles and impact of sleep habits. Review good sleep hygiene tips.    Education: Stress and Anxiety: - Provides group verbal and written instruction about the health risks of elevated stress and causes of high stress.  Discuss the correlation between heart/lung disease and anxiety and treatment options.  Review healthy ways to manage with stress and anxiety.   Initial Review & Psychosocial Screening: Initial Psych Review & Screening - 04/03/19 1219      Initial Review   Current issues with  Current Psychotropic Meds;Current Sleep Concerns;Current Depression;Current Stress Concerns    Source of Stress Concerns  Chronic Illness;Family;Poor Coping Skills;Unable to participate in former interests or hobbies;Unable to perform yard/household activities    Comments  She stresses about many things.  She has been caring for her aunt over the past year.  She has lost several family members to suicide and murder. She wants to breathe better to be able to do more at home.      Family Dynamics   Good Support System?  Yes   Husband, aunt, son   Comments  Recent loss of family and friends (4 in the last two months)      Barriers   Psychosocial barriers to participate in program  The patient should benefit from training in stress management and relaxation.;Psychosocial barriers identified (see note)      Screening Interventions   Interventions  Encouraged to exercise;To provide support and resources with identified psychosocial needs;Provide feedback about the scores to participant    Expected Outcomes  Short Term goal: Utilizing psychosocial counselor, staff and physician to assist with identification of specific Stressors or current issues interfering with healing process. Setting desired goal for each stressor or current issue identified.;Long Term Goal: Stressors or current issues are controlled or eliminated.;Short Term goal: Identification and review with participant of any Quality of Life or Depression concerns found by scoring the questionnaire.;Long Term goal: The participant improves quality of Life and PHQ9 Scores as seen by post scores and/or verbalization of changes       Quality of Life Scores:  Scores of 19 and below usually indicate a poorer quality of life in these areas.  A difference of   2-3 points is a clinically meaningful difference.  A difference of  2-3 points in the total score of the Quality of Life Index has been associated with significant improvement in overall quality of life, self-image, physical symptoms, and general health in studies assessing change in quality of life.  PHQ-9: Recent Review Flowsheet Data    Depression screen [facility] 2/9 05/24/2019 04/09/2019 10/29/2018   Decreased Interest 2 3 3    Down, Depressed, Hopeless 2 3 3    PHQ - 2 Score 4 6 6    Altered sleeping 3 3 1    Tired, decreased energy 3 3 3    Change in appetite 3 3 3    Feeling bad or failure about yourself  2 2 3    Trouble concentrating 3 3 3    Moving slowly or fidgety/restless 0 1 2   Suicidal thoughts 0 0 0   PHQ-9 Score 18 21 21    Difficult doing work/chores Extremely dIfficult - Very difficult     Interpretation of Total Score  Total Score Depression Severity:  1-4 = Minimal depression, 5-9 = Mild depression, 10-14 = Moderate depression, 15-19 = Moderately severe depression, 20-27 = Severe depression   Psychosocial Evaluation and Intervention: Psychosocial Evaluation - 04/03/19 1226      Psychosocial Evaluation & Interventions   Interventions  Stress management education;Encouraged to exercise with the program and follow exercise prescription    Comments  Selinda Eon is coming back to rehab after taking time off to help care for her aunt over the past year.  Seh has had a rough few months after losing family to suicide and murder.  She has a significant history of depression and anxiety but feels that her meds are helping to keep it manageable for her.  She really is looking forward to returning to rehab again! So much so that she turned up in person for her phone call today!  She wants to work on her breathing and lose the weight she has gained over the last few months.    Expected Outcomes  Short: Attend rehab regularly and use exerise as a stress outlet.  Long: Work on weight loss and breathing  to be more functional.    Continue Psychosocial Services   Follow up required by staff       Psychosocial Re-Evaluation: Psychosocial Re-Evaluation    [place] Name 05/13/19 1023 05/13/19 1026 05/24/19 1031         Psychosocial Re-Evaluation   Current issues with  Current Sleep Concerns;Current Stress Concerns  --  Current Sleep Concerns;Current Stress Concerns;Current Anxiety/Panic;Current Psychotropic Meds     Comments  [name] sees her Dr today - she still hasnt been sleeping well even using melatonin and CPAP.  They may switch to Bipap  [name] is calling her Dr today neurologist) - she still hasnt been sleeping well even using melatonin and CPAP.  They may switch to Bipap  Pt is on wellbutrin and lexapro to help with anxiety, but reports that it has not been helping her; pt has be on this medication for 1 year. Pt reports not seeing therapist for years. Encouraged pt to tlak to MD about medications management as well as looking into therpay. Suggested [facility] therapy as they give financial aid and flexible schedules and therapy. Encouraged pt to practice good sleep hygiene techniques as well as some basic cognitive behavioral therapy tools. Pt reports sleep apnea is affecting her sleep.     Expected Outcomes  Short: follow up with Dr Laverta Baltimore: improve sleep quality  --  Short: follow up with MD and look into therapy options.  Long: improve sleep quality, reduce anxiety and stress     Interventions  --  --  Stress management education;Encouraged to attend Pulmonary Rehabilitation for the exercise;Relaxation education     Continue Psychosocial Services   --  --  Follow up required by staff       Initial Review   Source of Stress Concerns  --  --  Chronic Illness;Family;Poor Coping Skills;Unable to participate in former interests or hobbies;Unable to perform yard/household activities;Financial        Psychosocial Discharge (Final Psychosocial Re-Evaluation): Psychosocial Re-Evaluation -  05/24/19 1031      Psychosocial Re-Evaluation   Current issues with  Current Sleep Concerns;Current Stress Concerns;Current Anxiety/Panic;Current Psychotropic Meds    Comments  Pt is on wellbutrin and lexapro to help with anxiety, but reports that it has not been helping her; pt has be on this medication for 1 year. Pt reports not seeing therapist for years. Encouraged pt to tlak to MD about medications management as well as looking into therpay. Suggested [facility] therapy as they give financial aid and flexible schedules and therapy. Encouraged pt to practice good sleep hygiene techniques as well as some basic cognitive behavioral therapy tools. Pt reports sleep apnea is affecting her sleep.    Expected Outcomes  Short: follow up with MD and look into therapy options.  Long: improve sleep quality, reduce anxiety and stress    Interventions  Stress management education;Encouraged to attend Pulmonary Rehabilitation for the exercise;Relaxation education    Continue Psychosocial Services   Follow up required by staff      Initial Review   Source of Stress Concerns  Chronic Illness;Family;Poor Coping Skills;Unable to participate in former interests or hobbies;Unable to perform yard/household activities;Financial       Education: Education Goals: Education classes will be provided on a weekly basis, covering required topics. Participant will state understanding/return demonstration of topics presented.  Learning Barriers/Preferences: Learning Barriers/Preferences - 04/03/19 1219      Learning Barriers/Preferences   Learning Barriers  Hearing    Learning Preferences  None       General Pulmonary Education Topics:  Infection Prevention: - Provides verbal and written material to individual with discussion of infection control including proper hand washing and proper equipment cleaning during exercise session.   Pulmonary Rehab from 10/11/2018 in [facility] Cardiac and Pulmonary Rehab  Date   10/11/18  Educator  AS  Instruction Review Code  1- Verbalizes Understanding      Falls Prevention: - Provides verbal and written material to individual with discussion of falls prevention and safety.   Pulmonary Rehab from 10/11/2018 in [facility] Cardiac and Pulmonary Rehab  Date  10/11/18  Educator  AS  Instruction Review Code  1- Verbalizes Understanding      Chronic Lung Diseases: - Group verbal and written instruction to review updates, respiratory medications, advancements in procedures and treatments. Discuss use of supplemental oxygen including available portable oxygen systems, continuous and intermittent flow rates, concentrators, personal use and safety guidelines. Review proper use of inhaler and spacers. Provide informative websites for self-education.    Energy Conservation: - Provide group verbal and written instruction for methods to conserve energy, plan and organize activities. Instruct on pacing techniques, use of adaptive equipment and posture/positioning to relieve shortness of breath.   Triggers and Exacerbations: - Group verbal and written instruction to review types of environmental triggers and ways to prevent exacerbations. Discuss weather changes, air quality and the benefits of nasal washing. Review warning signs and symptoms  to help prevent infections. Discuss techniques for effective airway clearance, coughing, and vibrations.   AED/CPR: - Group verbal and written instruction with the use of models to demonstrate the basic use of the AED with the basic ABC's of resuscitation.   Anatomy and Physiology of the Lungs: - Group verbal and written instruction with the use of models to provide basic lung anatomy and physiology related to function, structure and complications of lung disease.   Anatomy & Physiology of the Heart: - Group verbal and written instruction and models provide basic cardiac anatomy and physiology, with the coronary electrical and arterial  systems. Review of Valvular disease and Heart Failure   Cardiac Medications: - Group verbal and written instruction to review commonly prescribed medications for heart disease. Reviews the medication, class of the drug, and side effects.   Other: -Provides group and verbal instruction on various topics (see comments)   Knowledge Questionnaire Score: Knowledge Questionnaire Score - 04/09/19 1518      Knowledge Questionnaire Score   Pre Score  15/18        Core Components/Risk Factors/Patient Goals at Admission: Personal Goals and Risk Factors at Admission - 04/09/19 1502      Core Components/Risk Factors/Patient Goals on Admission   Intervention  Weight Management: Develop a combined nutrition and exercise program designed to reach desired caloric intake, while maintaining appropriate intake of nutrient and fiber, sodium and fats, and appropriate energy expenditure required for the weight goal.;Weight Management: Provide education and appropriate resources to help participant work on and attain dietary goals.;Weight Management/Obesity: Establish reasonable short term and long term weight goals.;Obesity: Provide education and appropriate resources to help participant work on and attain dietary goals.    Admit Weight  226 lb (102.5 kg)    Goal Weight: Short Term  215 lb (97.5 kg)    Goal Weight: Long Term  200 lb (90.7 kg)    Expected Outcomes  Short Term: Continue to assess and modify interventions until short term weight is achieved;Long Term: Adherence to nutrition and physical activity/exercise program aimed toward attainment of established weight goal;Weight Loss: Understanding of general recommendations for a balanced deficit meal plan, which promotes 1-2 lb weight loss per week and includes a negative energy balance of [phone] kcal/d;Understanding recommendations for meals to include 15-35% energy as protein, 25-35% energy from fat, 35-60% energy from carbohydrates, less than 234m of  dietary cholesterol, 20-35 gm of total fiber daily;Understanding of distribution of calorie intake throughout the day with the consumption of 4-5 meals/snacks    Intervention  Provide education on lifestyle modifcations including regular physical activity/exercise, weight management, moderate sodium restriction and increased consumption of fresh fruit, vegetables, and low fat dairy, alcohol moderation, and smoking cessation.;Monitor prescription use compliance.    Expected Outcomes  Short Term: Continued assessment and intervention until BP is < 140/932mHG in hypertensive participants. < 130/8057mG in hypertensive participants with diabetes, heart failure or chronic kidney disease.;Long Term: Maintenance of blood pressure at goal levels.       Education:Diabetes - Individual verbal and written instruction to review signs/symptoms of diabetes, desired ranges of glucose level fasting, after meals and with exercise. Acknowledge that pre and post exercise glucose checks will be done for 3 sessions at entry of program.   Education: Know Your Numbers and Risk Factors: -Group verbal and written instruction about important numbers in your health.  Discussion of what are risk factors and how they play a role in the disease process.  Review of  Cholesterol, Blood Pressure, Diabetes, and BMI and the role they play in your overall health.   Core Components/Risk Factors/Patient Goals Review:  Goals and Risk Factor Review    Row Name 05/13/19 1008 05/24/19 1118 05/27/19 1023         Core Components/Risk Factors/Patient Goals Review   Personal Goals Review  Weight Management/Obesity;Hypertension;Lipids  Weight Management/Obesity;Hypertension;Lipids;Stress;Develop more efficient breathing techniques such as purse lipped breathing and diaphragmatic breathing and practicing self-pacing with activity.;Improve shortness of breath with ADL's  Weight Management/Obesity;Improve shortness of breath with ADL's;Lipids      Review  [name] had first Covid vaccine last week.  She felt a little scratchy throat but is better now.  She is on allergy and cold medicine.  She is taking all other meds as directed.  Weight is staying about the same.  With her new medication her lipid levels have been normal range and BP running 128-130s/70-low 80s. Taking medication as directed.  Spoke to patient about their shortness of breath and what they can do to improve. Patient has been informed of breathing techniques when starting the program. Patient is informed to tell staff if they have had any med changes and that certain meds they are taking or not taking can be causing shortness of breath.     Expected Outcomes  Short:  continue with current plan Long:  manage risk factors  Short:  continue with current plan Long:  manage risk factors  Short: Attend [facility] regularly to improve shortness of breath with ADL's. Long: maintain independence with ADL's        Core Components/Risk Factors/Patient Goals at Discharge (Final Review):  Goals and Risk Factor Review - 05/27/19 1023      Core Components/Risk Factors/Patient Goals Review   Personal Goals Review  Weight Management/Obesity;Improve shortness of breath with ADL's;Lipids    Review  Spoke to patient about their shortness of breath and what they can do to improve. Patient has been informed of breathing techniques when starting the program. Patient is informed to tell staff if they have had any med changes and that certain meds they are taking or not taking can be causing shortness of breath.    Expected Outcomes  Short: Attend [facility] regularly to improve shortness of breath with ADL's. Long: maintain independence with ADL's       ITP Comments: ITP Comments    Row Name 01/01/19 1144 01/01/19 1429 04/03/19 1240 04/10/19 1104 04/24/19 0553   ITP Comments  Called to check on pt.  Out since 10/31/18 caring for her aunt.  Left message.  Sent message on MyChart.  [name] returned  our message.  She would like to discharge at this time.  Completed virtual orientation today.  EP evaluation is scheduled for Tuesday 3/9 at 2pm.  Documentation for diagnosis can be found in [facility] encounter 03/18/19.  She will restart on session number 8.  First full day of exercise!  Patient was oriented to gym and equipment including functions, settings, policies, and procedures.  Patient's individual exercise prescription and treatment plan were reviewed.  All starting workloads were established based on the results of the 6 minute walk test done at initial orientation visit.  The plan for exercise progression was also introduced and progression will be customized based on patient's performance and goals.  30 day chart review completed. ITP sent to Dr Zachery Dakins Medical Director, for review,changes as needed and signature. Continue with ITP if no changes requested    New to program  [place] Name 05/22/19 0533 06/17/19 1437 06/19/19 0535       ITP Comments  30 Day review completed. Medical Director review done, changes made as directed,and approval shown by signature of Market researcher.  Michlle called out today for a doctor's appt.  Last attended on 4/30  30 Day review completed. ITP review done, changes made as directed,and approval shown by signature of  Scientist, research (life sciences).        Comments:

## 2019-06-20 ENCOUNTER — Other Ambulatory Visit: Payer: Self-pay

## 2019-06-20 ENCOUNTER — Encounter: Payer: BC Managed Care – PPO | Admitting: *Deleted

## 2019-06-20 ENCOUNTER — Ambulatory Visit: Payer: BC Managed Care – PPO | Admitting: Adult Health Nurse Practitioner

## 2019-06-20 ENCOUNTER — Encounter: Payer: Self-pay | Admitting: Adult Health Nurse Practitioner

## 2019-06-20 VITALS — BP 120/76 | HR 83 | Temp 97.5°F | Wt 225.6 lb

## 2019-06-20 DIAGNOSIS — Z79899 Other long term (current) drug therapy: Secondary | ICD-10-CM | POA: Diagnosis not present

## 2019-06-20 DIAGNOSIS — Q839 Congenital malformation of breast, unspecified: Secondary | ICD-10-CM

## 2019-06-20 DIAGNOSIS — F419 Anxiety disorder, unspecified: Secondary | ICD-10-CM

## 2019-06-20 DIAGNOSIS — Z7982 Long term (current) use of aspirin: Secondary | ICD-10-CM | POA: Diagnosis not present

## 2019-06-20 DIAGNOSIS — I10 Essential (primary) hypertension: Secondary | ICD-10-CM | POA: Diagnosis not present

## 2019-06-20 DIAGNOSIS — G4733 Obstructive sleep apnea (adult) (pediatric): Secondary | ICD-10-CM | POA: Diagnosis not present

## 2019-06-20 DIAGNOSIS — E785 Hyperlipidemia, unspecified: Secondary | ICD-10-CM | POA: Diagnosis not present

## 2019-06-20 DIAGNOSIS — T7840XD Allergy, unspecified, subsequent encounter: Secondary | ICD-10-CM

## 2019-06-20 DIAGNOSIS — N6310 Unspecified lump in the right breast, unspecified quadrant: Secondary | ICD-10-CM

## 2019-06-20 DIAGNOSIS — F329 Major depressive disorder, single episode, unspecified: Secondary | ICD-10-CM | POA: Diagnosis not present

## 2019-06-20 DIAGNOSIS — F330 Major depressive disorder, recurrent, mild: Secondary | ICD-10-CM

## 2019-06-20 DIAGNOSIS — R0600 Dyspnea, unspecified: Secondary | ICD-10-CM | POA: Diagnosis not present

## 2019-06-20 DIAGNOSIS — R0609 Other forms of dyspnea: Secondary | ICD-10-CM

## 2019-06-20 NOTE — Progress Notes (Signed)
Daily Session Note  Patient Details  Name: Samantha Greene MRN: [id number] Date of Birth: [birth year] Referring Provider:     Pulmonary Rehab from 10/11/2018 in [facility] Cardiac and Pulmonary Rehab  Referring Provider  [place]      Encounter Date: 06/20/2019  Check In: Session Check In - 06/20/19 1701      Check-In   Supervising physician immediately available to respond to emergencies  See telemetry face sheet for immediately available ER MD    Location  [facility]-Cardiac & Pulmonary Rehab    Staff Present  [name], [name];Joseph [address] [place], [place], ACSM CEP, Exercise Physiologist    Virtual Visit  No    Medication changes reported      No    Fall or balance concerns reported     No    Warm-up and Cool-down  Performed on first and last piece of equipment    Resistance Training Performed  Yes    VAD Patient?  No    PAD/SET Patient?  No      Pain Assessment   Currently in Pain?  No/denies          Social History   Tobacco Use  Smoking Status Never Smoker  Smokeless Tobacco Never Used    Goals Met:  Independence with exercise equipment Exercise tolerated well No report of cardiac concerns or symptoms Strength training completed today  Goals Unmet:  Not Applicable  Comments: Pt able to follow exercise prescription today without complaint.  Will continue to monitor for progression.    Dr. Emily Filbert is Medical Director for [place] and [facility].

## 2019-06-20 NOTE — Progress Notes (Signed)
Hospital visit follow up  Assessment and Plan: Hospital visit follow up for allergic reaction  Samantha Greene was seen today for follow-up.  Diagnoses and all orders for this visit:  Nipple anomaly  [place] Unlisted Procedure Breast;   Breast mass, right -     [place] Unlisted Procedure Breast; Future -Clindamycin, finish course Continue to monitor Pending imaging  Allergic reaction, subsequent encounter Doing well No further symptoms  Anxiety Mild recurrent depressive disorder(HCC) Taking wellbutrin 300mg  and escitalopram 10mg  every morning Doing well, continue with benefit  Discussed stress management techniques   Discussed good sleep hygiene Discussed increasing physical activity and exercise Increase water intake   All medications were reviewed with patient and family and fully reconciled. All questions answered fully, and patient and family members were encouraged to call the office with any further questions or concerns. Discussed goal to avoid readmission related to this diagnosis.  There are no discontinued medications.  Over 30 minutes of face to face interview, exam, counseling, chart review, and complex, high/moderate level critical decision making was performed this visit.   Future Appointments  Date Time Provider [place]  06/27/2019  5:00 PM [facility] None  07/03/2019  5:00 PM [facility] None  07/04/2019  5:00 PM [facility] None  07/08/2019  5:00 PM [facility] None  07/09/2019  3:45 PM McCue, Janett Billow, NP [facility] None  07/10/2019  5:00 PM [facility] None  07/11/2019  5:00 PM [facility] None  07/15/2019  5:00 PM [facility] None  07/17/2019  5:00 PM  [facility] None  07/18/2019  5:00 PM [facility] None  07/22/2019  5:00 PM [facility] None  07/24/2019  5:00 PM [facility] None  07/25/2019  5:00 PM [facility] None  07/29/2019  5:00 PM [facility] None  07/31/2019  5:00 PM [facility] None  08/01/2019  5:00 PM [facility] None  08/13/2019  4:30 PM Melrose Nakayama, MD [facility]  09/23/2019 10:30 AM Vicie Mutters, PA-C [facility] None  01/20/2020  2:00 PM Princess Bruins, MD [facility]  03/17/2020 10:00 AM Vicie Mutters, PA-C [facility] None     HPI 49 y.o.female presents for follow up for transition from hospital evaluation on 06/17/19.  Our clinical staff contacted the office the day after discharge to set up a follow up appointment. The discharge summary, medications, and diagnostic test results were reviewed before meeting with the patient. The patient was seen for: Allergic reaction to doxycycline.    She seen in office on 06/17/19 for pain in her right breast.  She was started on doxycycline.  She started her first dose that afternoon.  She reports four house later she started having restless legs, jitters, stiff neck, difficulty opening jaw and headache with duration of 65min.  Also reports chest tightening.  Her husband too her to EMS station and she was transported to ED.  She was given IV benadryl and ABX treatment changed to clindamycin. She has pending ultrasound/mammograms  Home health is not involved.   Images while in the hospital: No results found.    Current Outpatient Medications (Cardiovascular):  .  rosuvastatin (CRESTOR) 5 MG tablet, TAKE 1 TABLET BY MOUTH  EVERYDAY AT BEDTIME .  telmisartan (MICARDIS) 40  MG tablet, TAKE 1 TABLET DAILY FOR BLOOD PRESSURE  Current Outpatient Medications (Respiratory):  .  albuterol (VENTOLIN HFA) 108 (90 Base) MCG/ACT inhaler, INHALE 2 PUFFS INTO THE LUNGS EVERY 4 (FOUR) HOURS AS NEEDED FOR WHEEZING OR SHORTNESS OF BREATH. .  cetirizine (ZYRTEC) 10 MG tablet, Take 10 mg by mouth at bedtime. .  fexofenadine (ALLEGRA) 180 MG tablet, Take 180 mg by mouth daily as needed for allergies or rhinitis. Alternates with zyrtec when Zyrtec seems to not be as effective .  montelukast (SINGULAIR) 10 MG tablet, Take 1 tablet daily for Allergies .  fluticasone (FLONASE) 50 MCG/ACT nasal spray, Place 2 sprays into both nostrils daily.  Current Outpatient Medications (Analgesics):  .  aspirin 81 MG tablet, Take 81 mg by mouth daily. .  meloxicam (MOBIC) 15 MG tablet, Take 1/2 to 1 tablet Daily for Pain & Inflammation  (Limit to 4-5 tabs /week Tto Avoid Kidney Damage)   Current Outpatient Medications (Other):  .  baclofen (LIORESAL) 10 MG tablet, TAKE 1/2 TO 1 TABLET 1 OR 2 X /DAY AS NEEDED FOR MUSCLE SPASMS .  buPROPion (WELLBUTRIN XL) 300 MG 24 hr tablet, Take 1 tablet every Morning for Mood, Focus & Concentration .  Cholecalciferol (VITAMIN D) 125 MCG (5000 UT) CAPS, Take 5,000 Units by mouth daily.  Marland Kitchen  escitalopram (LEXAPRO) 20 MG tablet, Take 1 tablet Daily for Mood .  Eszopiclone 3 MG TABS, TAKE 1 TABLET BY MOUTH AT BEDTIME AS NEEDED. TAKE IMMEDIATELY BEFORE BEDTIME .  fluocinolone (SYNALAR) 0.025 % ointment, APPLY TO AFFECTED AREA TWICE A DAY .  MELATONIN PO, Take 10 mg by mouth. .  Multiple Vitamin (MULTIVITAMIN) tablet, Take 1 tablet by mouth daily. Marland Kitchen  nystatin cream (MYCOSTATIN), Apply 1 application topically 2 (two) times daily. (Patient taking differently: Apply 1 application topically 2 (two) times daily as needed for dry skin. ) .  omeprazole (PRILOSEC) 40 MG capsule, TAKE 1 CAPSULE BY MOUTH EVERY DAY .   triamcinolone ointment (KENALOG) 0.1 %, Apply 1 application topically 2 (two) times daily. (Patient taking differently: Apply 1 application topically daily as needed (rash). ) .  vitamin C (ASCORBIC ACID) 500 MG tablet, Take 500 mg by mouth daily. .  vitamin E 600 UNIT capsule, Take 600 Units by mouth daily. Marland Kitchen  doxycycline (VIBRAMYCIN) 100 MG capsule, Take 1 capsule twice daily with food  Past Medical History:  Diagnosis Date  . Anxiety   . Arthritis   . Depression    no meds  . Diaphragmatic hernia with obstruction, without gangrene 11/07/2018  . Dyspnea    can happen anytime  . Family history of breast cancer   . Family history of colon cancer   . Family history of lung cancer   . Family history of pancreatic cancer   . Family history of prostate cancer   . GERD (gastroesophageal reflux disease)   . Hernia, diaphragmatic   . History of kidney stones   . Hyperlipidemia   . Hypertension    no meds  . Pneumonia    1 time     Allergies  Allergen Reactions  . Doxycycline Shortness Of Breath and Anxiety  . Erythromycin Nausea Only and Other (See Comments)    Stomach cramping (can take Z Pak)  . Flexeril [Cyclobenzaprine Hcl] Other (See Comments)    "don't like the way I feel"  . Metformin And Related Other (See Comments)    GI Upset    ROS: all negative except above.  Physical Exam: Filed Weights   06/20/19 1043  Weight: 225 lb 9.6 oz (102.3 kg)   BP 120/76   Pulse 83   Temp (!) 97.5 F (36.4 C)   Wt 225 lb 9.6 oz (102.3 kg)   LMP 06/05/2019   SpO2 98%   BMI 42.63 kg/m  General Appearance: Well nourished, in no apparent distress. Eyes: PERRLA, EOMs, conjunctiva no swelling or erythema Sinuses: No Frontal/maxillary tenderness ENT/Mouth: Ext aud canals clear, TMs without erythema, bulging. No erythema, swelling, or exudate on post pharynx.  Tonsils not swollen or erythematous. Hearing normal.  Neck: Supple, thyroid normal.  Respiratory: Respiratory effort  normal, BS equal bilaterally without rales, rhonchi, wheezing or stridor.  Cardio: RRR with no MRGs. Brisk peripheral pulses without edema.  Abdomen: Soft, + BS.  Non tender, no guarding, rebound, hernias, masses. Lymphatics: Non tender without lymphadenopathy.  Musculoskeletal: Full ROM, 5/5 strength, normal gait.  Skin: Warm, dry without rashes, lesions, ecchymosis.  Neuro: Cranial nerves intact. Normal muscle tone, no cerebellar symptoms. Sensation intact.  Psych: Awake and oriented X 3, normal affect, Insight and Judgment appropriate.    Breast: Left w/o masses, lesions, nipple abnormalities or axillary nodes.  Right breast 1 o'clock 1cm firm, moveable nodule.  Left nipple scaly, no discharge noted.    Elder Negus, NP 11:18 AM [facility]

## 2019-06-21 ENCOUNTER — Ambulatory Visit
Admission: RE | Admit: 2019-06-21 | Discharge: 2019-06-21 | Disposition: A | Discharge status: Home / Self Care | Payer: BC Managed Care – PPO | Source: Ambulatory Visit | Attending: Physician Assistant | Admitting: Physician Assistant

## 2019-06-21 DIAGNOSIS — Q839 Congenital malformation of breast, unspecified: Secondary | ICD-10-CM

## 2019-06-21 DIAGNOSIS — R928 Other abnormal and inconclusive findings on diagnostic imaging of breast: Secondary | ICD-10-CM | POA: Diagnosis not present

## 2019-06-21 DIAGNOSIS — N6489 Other specified disorders of breast: Secondary | ICD-10-CM | POA: Diagnosis not present

## 2019-06-23 DIAGNOSIS — G4733 Obstructive sleep apnea (adult) (pediatric): Secondary | ICD-10-CM | POA: Diagnosis not present

## 2019-06-24 ENCOUNTER — Encounter: Payer: BC Managed Care – PPO | Admitting: *Deleted

## 2019-06-24 ENCOUNTER — Other Ambulatory Visit: Payer: Self-pay

## 2019-06-24 DIAGNOSIS — R0600 Dyspnea, unspecified: Secondary | ICD-10-CM | POA: Diagnosis not present

## 2019-06-24 DIAGNOSIS — F419 Anxiety disorder, unspecified: Secondary | ICD-10-CM | POA: Diagnosis not present

## 2019-06-24 DIAGNOSIS — R0609 Other forms of dyspnea: Secondary | ICD-10-CM

## 2019-06-24 DIAGNOSIS — I10 Essential (primary) hypertension: Secondary | ICD-10-CM | POA: Diagnosis not present

## 2019-06-24 DIAGNOSIS — Z79899 Other long term (current) drug therapy: Secondary | ICD-10-CM | POA: Diagnosis not present

## 2019-06-24 DIAGNOSIS — F329 Major depressive disorder, single episode, unspecified: Secondary | ICD-10-CM | POA: Diagnosis not present

## 2019-06-24 DIAGNOSIS — Z7982 Long term (current) use of aspirin: Secondary | ICD-10-CM | POA: Diagnosis not present

## 2019-06-24 DIAGNOSIS — E785 Hyperlipidemia, unspecified: Secondary | ICD-10-CM | POA: Diagnosis not present

## 2019-06-24 NOTE — Progress Notes (Signed)
Daily Session Note  Patient Details  Name: EMBER HENRIKSON MRN: [id number] Date of Birth: [birth year] Referring Provider:     Pulmonary Rehab from 10/11/2018 in [facility]  Referring Provider  [place]      Encounter Date: 06/24/2019  Check In: Session Check In - 06/24/19 1714      Check-In   Supervising physician immediately available to respond to emergencies  See telemetry face sheet for immediately available ER MD    Location  [facility]    Staff Present  [name], [name];Joseph [address] [place], [place], ACSM CEP, Exercise Physiologist    Virtual Visit  No    Medication changes reported      No    Fall or balance concerns reported     No    Warm-up and Cool-down  Performed on first and last piece of equipment    Resistance Training Performed  Yes    VAD Patient?  No    PAD/SET Patient?  No      Pain Assessment   Currently in Pain?  No/denies          Social History   Tobacco Use  Smoking Status Never Smoker  Smokeless Tobacco Never Used    Goals Met:  Independence with exercise equipment Exercise tolerated well No report of cardiac concerns or symptoms Strength training completed today  Goals Unmet:  Not Applicable  Comments: Pt able to follow exercise prescription today without complaint.  Will continue to monitor for progression.    Dr. Emily Filbert is Medical Director for [facility].

## 2019-06-26 ENCOUNTER — Encounter: Payer: Self-pay | Admitting: Adult Health Nurse Practitioner

## 2019-06-26 NOTE — Patient Instructions (Signed)
declined

## 2019-07-03 ENCOUNTER — Encounter: Payer: BC Managed Care – PPO | Attending: Internal Medicine

## 2019-07-03 DIAGNOSIS — R0600 Dyspnea, unspecified: Secondary | ICD-10-CM | POA: Insufficient documentation

## 2019-07-03 DIAGNOSIS — Z7982 Long term (current) use of aspirin: Secondary | ICD-10-CM | POA: Insufficient documentation

## 2019-07-03 DIAGNOSIS — F419 Anxiety disorder, unspecified: Secondary | ICD-10-CM | POA: Insufficient documentation

## 2019-07-03 DIAGNOSIS — Z79899 Other long term (current) drug therapy: Secondary | ICD-10-CM | POA: Insufficient documentation

## 2019-07-03 DIAGNOSIS — F329 Major depressive disorder, single episode, unspecified: Secondary | ICD-10-CM | POA: Insufficient documentation

## 2019-07-03 DIAGNOSIS — E785 Hyperlipidemia, unspecified: Secondary | ICD-10-CM | POA: Insufficient documentation

## 2019-07-03 DIAGNOSIS — I10 Essential (primary) hypertension: Secondary | ICD-10-CM | POA: Insufficient documentation

## 2019-07-05 ENCOUNTER — Other Ambulatory Visit: Payer: Self-pay

## 2019-07-05 DIAGNOSIS — N921 Excessive and frequent menstruation with irregular cycle: Secondary | ICD-10-CM

## 2019-07-09 ENCOUNTER — Encounter: Payer: Self-pay | Admitting: Adult Health

## 2019-07-09 ENCOUNTER — Ambulatory Visit: Payer: BC Managed Care – PPO | Admitting: Adult Health

## 2019-07-09 ENCOUNTER — Telehealth: Payer: Self-pay

## 2019-07-09 NOTE — Telephone Encounter (Signed)
Called pt to check in as we had not seen her at rehab since 5/20. LM.

## 2019-07-10 DIAGNOSIS — Z0279 Encounter for issue of other medical certificate: Secondary | ICD-10-CM

## 2019-07-11 ENCOUNTER — Other Ambulatory Visit: Payer: Self-pay

## 2019-07-11 ENCOUNTER — Encounter: Payer: Self-pay | Admitting: Adult Health

## 2019-07-11 ENCOUNTER — Ambulatory Visit: Payer: BC Managed Care – PPO | Admitting: Adult Health

## 2019-07-11 VITALS — BP 130/83 | HR 77 | Ht 61.0 in | Wt 225.0 lb

## 2019-07-11 DIAGNOSIS — G4733 Obstructive sleep apnea (adult) (pediatric): Secondary | ICD-10-CM | POA: Diagnosis not present

## 2019-07-11 DIAGNOSIS — G4710 Hypersomnia, unspecified: Secondary | ICD-10-CM | POA: Diagnosis not present

## 2019-07-11 DIAGNOSIS — F419 Anxiety disorder, unspecified: Secondary | ICD-10-CM

## 2019-07-11 DIAGNOSIS — Z9981 Dependence on supplemental oxygen: Secondary | ICD-10-CM

## 2019-07-11 DIAGNOSIS — R0902 Hypoxemia: Secondary | ICD-10-CM

## 2019-07-11 DIAGNOSIS — Z9989 Dependence on other enabling machines and devices: Secondary | ICD-10-CM

## 2019-07-11 DIAGNOSIS — F329 Major depressive disorder, single episode, unspecified: Secondary | ICD-10-CM
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
## 2019-07-11 NOTE — Patient Instructions (Addendum)
Your Plan:  Please contact [facility] in regards to use of oxygen - we will also look at it through our end and keep you updated   Referral placed to [name], PA at [facility] for depression/anxiety management as this could be contributing to your fatigue and insomnia    Follow up in 6 months or call earlier if needed       Thank you for coming to see [place] at [facility]. I hope we have been able to provide you high quality care today.  You may receive a patient satisfaction survey over the next few weeks. We would appreciate your feedback and comments so that we may continue to improve ourselves and the health of our patients.

## 2019-07-15 ENCOUNTER — Encounter: Payer: BC Managed Care – PPO | Admitting: *Deleted

## 2019-07-15 ENCOUNTER — Other Ambulatory Visit: Payer: Self-pay

## 2019-07-15 DIAGNOSIS — R0609 Other forms of dyspnea: Secondary | ICD-10-CM

## 2019-07-15 DIAGNOSIS — Z7982 Long term (current) use of aspirin: Secondary | ICD-10-CM | POA: Diagnosis not present

## 2019-07-15 DIAGNOSIS — Z79899 Other long term (current) drug therapy: Secondary | ICD-10-CM | POA: Diagnosis not present

## 2019-07-15 DIAGNOSIS — E785 Hyperlipidemia, unspecified: Secondary | ICD-10-CM | POA: Diagnosis not present

## 2019-07-15 DIAGNOSIS — I10 Essential (primary) hypertension: Secondary | ICD-10-CM | POA: Diagnosis not present

## 2019-07-15 DIAGNOSIS — F329 Major depressive disorder, single episode, unspecified: Secondary | ICD-10-CM | POA: Diagnosis not present

## 2019-07-15 DIAGNOSIS — R0600 Dyspnea, unspecified: Secondary | ICD-10-CM | POA: Diagnosis not present

## 2019-07-15 DIAGNOSIS — F419 Anxiety disorder, unspecified: Secondary | ICD-10-CM | POA: Diagnosis not present

## 2019-07-15 NOTE — Progress Notes (Signed)
Daily Session Note  Patient Details  Name: Samantha Greene MRN: [id number] Date of Birth: 04/25/1970 Referring Provider:     Pulmonary Rehab from 10/11/2018 in [facility] Card[facility] Re[facility]  Referring Provider [place]      Encounter Date: 07/15/2019  Check In:  Session Check In - 07/15/19 1716      Check-In   Supervising physician immediately available to respond to emergencies See telemetry face sheet for immediately available ER MD    Location [facility]-Cardiac & Pulmonary Rehab    Staff Present [name], [name];Joseph [address] [place], [place], ACSM CEP, Exercise Physiologist    Virtual Visit No    Medication changes reported     No    Fall or balance concerns reported    No    Warm-up and Cool-down Performed on first and last piece of equipment    Resistance Training Performed Yes    VAD Patient? No    PAD/SET Patient? No      Pain Assessment   Currently in Pain? No/denies              Social History   Tobacco Use  Smoking Status Never Smoker  Smokeless Tobacco Never Used    Goals Met:  Independence with exercise equipment Exercise tolerated well No report of cardiac concerns or symptoms Strength training completed today  Goals Unmet:  Not Applicable  Comments: Pt able to follow exercise prescription today without complaint.  Will continue to monitor for progression.    Dr. Emily Filbert is Medical Director for [facility].

## 2019-07-17 ENCOUNTER — Encounter: Payer: Self-pay | Admitting: *Deleted

## 2019-07-17 ENCOUNTER — Encounter: Payer: BC Managed Care – PPO | Admitting: *Deleted

## 2019-07-17 ENCOUNTER — Other Ambulatory Visit: Payer: Self-pay | Admitting: Internal Medicine

## 2019-07-17 ENCOUNTER — Other Ambulatory Visit: Payer: Self-pay

## 2019-07-17 DIAGNOSIS — R0600 Dyspnea, unspecified: Secondary | ICD-10-CM | POA: Diagnosis not present

## 2019-07-17 DIAGNOSIS — R0609 Other forms of dyspnea: Secondary | ICD-10-CM

## 2019-07-17 DIAGNOSIS — Z79899 Other long term (current) drug therapy: Secondary | ICD-10-CM | POA: Diagnosis not present

## 2019-07-17 DIAGNOSIS — F329 Major depressive disorder, single episode, unspecified: Secondary | ICD-10-CM | POA: Diagnosis not present

## 2019-07-17 DIAGNOSIS — E785 Hyperlipidemia, unspecified: Secondary | ICD-10-CM | POA: Diagnosis not present

## 2019-07-17 DIAGNOSIS — F419 Anxiety disorder, unspecified: Secondary | ICD-10-CM | POA: Diagnosis not present

## 2019-07-17 DIAGNOSIS — Z7982 Long term (current) use of aspirin: Secondary | ICD-10-CM | POA: Diagnosis not present

## 2019-07-17 DIAGNOSIS — I10 Essential (primary) hypertension: Secondary | ICD-10-CM | POA: Diagnosis not present

## 2019-07-17 NOTE — Progress Notes (Signed)
Daily Session Note  Patient Details  Name: VEARL AITKEN MRN: [id number] Date of Birth: 29-Apr-1970 Referring Provider:     Pulmonary Rehab from 10/11/2018 in [facility] Cardiac and Pulmonary Rehab  Referring Provider [place]      Encounter Date: 07/17/2019  Check In:  Session Check In - 07/17/19 1703      Check-In   Supervising physician immediately available to respond to emergencies See telemetry face sheet for immediately available ER MD    Location [facility]-Cardiac & Pulmonary Rehab    Staff Present [name], [name];Melissa Caiola RDN, Rowe Pavy, BA, ACSM CEP, Exercise Physiologist    Virtual Visit No    Medication changes reported     No    Fall or balance concerns reported    No    Warm-up and Cool-down Performed on first and last piece of equipment    Resistance Training Performed Yes    VAD Patient? No    PAD/SET Patient? No      Pain Assessment   Currently in Pain? No/denies              Social History   Tobacco Use  Smoking Status Never Smoker  Smokeless Tobacco Never Used    Goals Met:  Independence with exercise equipment Exercise tolerated well No report of cardiac concerns or symptoms Strength training completed today  Goals Unmet:  Not Applicable  Comments: Pt able to follow exercise prescription today without complaint.  Will continue to monitor for progression.    Dr. Emily Filbert is Medical Director for [facility].

## 2019-07-17 NOTE — Progress Notes (Signed)
Pulmonary Individual Treatment Plan  Patient Details  Name: Samantha Greene MRN: [id number] Date of Birth: 1970-12-07 Referring Provider:     Pulmonary Rehab from 10/11/2018 in [facility] Cardiac and Pulmonary Rehab  Referring Provider [place]      Initial Encounter Date:    Pulmonary Rehab from 10/11/2018 in [facility] Cardiac and Pulmonary Rehab  Date 10/11/18      Visit Diagnosis: DOE (dyspnea on exertion)  Patient's Home Medications on Admission:  Current Outpatient Medications:  .  albuterol (VENTOLIN HFA) 108 (90 Base) MCG/ACT inhaler, INHALE 2 PUFFS INTO THE LUNGS EVERY 4 (FOUR) HOURS AS NEEDED FOR WHEEZING OR SHORTNESS OF BREATH., Disp: 18 g, Rfl: 2 .  aspirin 81 MG tablet, Take 81 mg by mouth daily., Disp: , Rfl:  .  baclofen (LIORESAL) 10 MG tablet, TAKE 1/2 TO 1 TABLET 1 OR 2 X /DAY AS NEEDED FOR MUSCLE SPASMS, Disp: 180 tablet, Rfl: 1 .  buPROPion (WELLBUTRIN XL) 300 MG 24 hr tablet, Take 1 tablet every Morning for Mood, Focus & Concentration, Disp: 90 tablet, Rfl: 3 .  cetirizine (ZYRTEC) 10 MG tablet, Take 10 mg by mouth at bedtime., Disp: , Rfl:  .  Cholecalciferol (VITAMIN D) 125 MCG (5000 UT) CAPS, Take 5,000 Units by mouth daily. , Disp: , Rfl:  .  escitalopram (LEXAPRO) 20 MG tablet, Take 1 tablet Daily for Mood, Disp: 90 tablet, Rfl: 3 .  Eszopiclone 3 MG TABS, TAKE 1 TABLET BY MOUTH AT BEDTIME AS NEEDED. TAKE IMMEDIATELY BEFORE BEDTIME, Disp: 30 tablet, Rfl: 3 .  fexofenadine (ALLEGRA) 180 MG tablet, Take 180 mg by mouth daily as needed for allergies or rhinitis. Alternates with zyrtec when Zyrtec seems to not be as effective, Disp: , Rfl:  .  fluocinolone (SYNALAR) 0.025 % ointment, APPLY TO AFFECTED AREA TWICE A DAY, Disp: 30 g, Rfl: 0 .  fluticasone (FLONASE) 50 MCG/ACT nasal spray, Place 2 sprays into both nostrils daily., Disp: 16 g, Rfl: 2 .  MELATONIN PO, Take 10 mg by mouth., Disp: , Rfl:  .  meloxicam (MOBIC) 15 MG tablet, Take 1/2 to 1 tablet Daily for Pain &  Inflammation  (Limit to 4-5 tabs /week Tto Avoid Kidney Damage), Disp: 90 tablet, Rfl: 3 .  montelukast (SINGULAIR) 10 MG tablet, Take 1 tablet daily for Allergies, Disp: 90 tablet, Rfl: 0 .  Multiple Vitamin (MULTIVITAMIN) tablet, Take 1 tablet by mouth daily., Disp: , Rfl:  .  nystatin cream (MYCOSTATIN), Apply 1 application topically 2 (two) times daily. (Patient taking differently: Apply 1 application topically 2 (two) times daily as needed for dry skin. ), Disp: 30 g, Rfl: 1 .  omeprazole (PRILOSEC) 40 MG capsule, TAKE 1 CAPSULE BY MOUTH EVERY DAY, Disp: 90 capsule, Rfl: 3 .  rosuvastatin (CRESTOR) 5 MG tablet, TAKE 1 TABLET BY MOUTH EVERYDAY AT BEDTIME, Disp: 90 tablet, Rfl: 3 .  telmisartan (MICARDIS) 40 MG tablet, TAKE 1 TABLET DAILY FOR BLOOD PRESSURE, Disp: 90 tablet, Rfl: 3 .  triamcinolone ointment (KENALOG) 0.1 %, Apply 1 application topically 2 (two) times daily. (Patient taking differently: Apply 1 application topically daily as needed (rash). ), Disp: 80 g, Rfl: 1 .  vitamin C (ASCORBIC ACID) 500 MG tablet, Take 500 mg by mouth daily., Disp: , Rfl:  .  vitamin E 600 UNIT capsule, Take 600 Units by mouth daily., Disp: , Rfl:   Past Medical History: Past Medical History:  Diagnosis Date  . Anxiety   . Arthritis   .  Depression    no meds  . Diaphragmatic hernia with obstruction, without gangrene 11/07/2018  . Dyspnea    can happen anytime  . Family history of breast cancer   . Family history of colon cancer   . Family history of lung cancer   . Family history of pancreatic cancer   . Family history of prostate cancer   . GERD (gastroesophageal reflux disease)   . Hernia, diaphragmatic   . History of kidney stones   . Hyperlipidemia   . Hypertension    no meds  . Pneumonia    1 time    Tobacco Use: Social History   Tobacco Use  Smoking Status Never Smoker  Smokeless Tobacco Never Used    Labs: Recent Review Flowsheet Data    Labs for ITP Cardiac and Pulmonary  Rehab Latest Ref Rng & Units 03/24/2018 09/11/2018 12/12/2018 03/18/2019 06/17/2019   Cholestrol <200 mg/dL - 226(H) 170 174 168   LDLCALC mg/dL (calc) - 132(H) 78 82 79   HDL > OR = 50 mg/dL - 62 63 69 65   Trlycerides <150 mg/dL - 188(H) 194(H) 136 143   Hemoglobin A1c <5.7 % of total Hgb - 5.6 5.4 5.6 5.5   PHART 7.35 - 7.45 7.361 - - - -   PCO2ART 32 - 48 mmHg 45.9 - - - -   HCO3 20.0 - 28.0 mmol/L 25.4 - - - -   ACIDBASEDEF 0.0 - 2.0 mmol/L - - - - -   O2SAT % 96.7 - - - -       Pulmonary Assessment Scores:  Pulmonary Assessment Scores    Row Name 04/09/19 1513         ADL UCSD   SOB Score total 62     Rest 2     Walk 4     Stairs 5     Bath 2     Dress 2     Shop 2       CAT Score   CAT Score 25       mMRC Score   mMRC Score 2            UCSD: Self-administered rating of dyspnea associated with activities of daily living (ADLs) 6-point scale (0 = "not at all" to 5 = "maximal or unable to do because of breathlessness")  Scoring Scores range from 0 to 120.  Minimally important difference is 5 units  CAT: CAT can identify the health impairment of COPD patients and is better correlated with disease progression.  CAT has a scoring range of zero to 40. The CAT score is classified into four groups of low (less than 10), medium (10 - 20), high (21-30) and very high (31-40) based on the impact level of disease on health status. A CAT score over 10 suggests significant symptoms.  A worsening CAT score could be explained by an exacerbation, poor medication adherence, poor inhaler technique, or progression of COPD or comorbid conditions.  CAT MCID is 2 points  mMRC: mMRC (Modified Medical Research Council) Dyspnea Scale is used to assess the degree of baseline functional disability in patients of respiratory disease due to dyspnea. No minimal important difference is established. A decrease in score of 1 point or greater is considered a positive change.   Pulmonary Function  Assessment:   Exercise Target Goals: Exercise Program Goal: Individual exercise prescription set using results from initial 6 min walk test and THRR while considering  patient's activity barriers  and safety.   Exercise Prescription Goal: Initial exercise prescription builds to 30-45 minutes a day of aerobic activity, 2-3 days per week.  Home exercise guidelines will be given to patient during program as part of exercise prescription that the participant will acknowledge.  Education: Aerobic Exercise & Resistance Training: - Gives group verbal and written instruction on the various components of exercise. Focuses on aerobic and resistive training programs and the benefits of this training and how to safely progress through these programs..   Education: Exercise & Equipment Safety: - Individual verbal instruction and demonstration of equipment use and safety with use of the equipment.   Pulmonary Rehab from 10/11/2018 in [facility] Cardiac and Pulmonary Rehab  Date 10/11/18  Educator AS  Instruction Review Code 1- Verbalizes Understanding      Education: Exercise Physiology & General Exercise Guidelines: - Group verbal and written instruction with models to review the exercise physiology of the cardiovascular system and associated critical values. Provides general exercise guidelines with specific guidelines to those with heart or lung disease.    Education: Flexibility, Balance, Mind/Body Relaxation: Provides group verbal/written instruction on the benefits of flexibility and balance training, including mind/body exercise modes such as yoga, pilates and tai chi.  Demonstration and skill practice provided.   Activity Barriers & Risk Stratification:  Activity Barriers & Cardiac Risk Stratification - 04/03/19 1218      Activity Barriers & Cardiac Risk Stratification   Activity Barriers Deconditioning;Muscular Weakness;Shortness of Breath;History of Falls           6 Minute Walk:  6  Minute Walk    Row Name 04/09/19 1504         6 Minute Walk   Phase Initial     Distance 1200 feet     Walk Time 6 minutes     # of Rest Breaks 0     MPH 2.27     METS 3.37     RPE 17     Perceived Dyspnea  3     VO2 Peak 11.81     Symptoms No     Resting HR 79 bpm     Resting BP 136/84     Resting Oxygen Saturation  97 %     Exercise Oxygen Saturation  during 6 min walk 94 %     Max Ex. HR 105 bpm     Max Ex. BP 164/82     2 Minute Post BP 138/86       Interval HR   1 Minute HR 92     2 Minute HR 100     3 Minute HR 103     4 Minute HR 105     5 Minute HR 104     6 Minute HR 103     2 Minute Post HR 86     Interval Heart Rate? Yes       Interval Oxygen   Interval Oxygen? Yes     Baseline Oxygen Saturation % 97 %     1 Minute Oxygen Saturation % 98 %     1 Minute Liters of Oxygen 0 L     2 Minute Oxygen Saturation % 94 %     2 Minute Liters of Oxygen 0 L     3 Minute Oxygen Saturation % 95 %     3 Minute Liters of Oxygen 0 L     4 Minute Oxygen Saturation % 95 %     4 Minute  Liters of Oxygen 0 L     5 Minute Oxygen Saturation % 95 %     5 Minute Liters of Oxygen 0 L     6 Minute Oxygen Saturation % 96 %     6 Minute Liters of Oxygen 0 L     2 Minute Post Oxygen Saturation % 99 %     2 Minute Post Liters of Oxygen 0 L           Oxygen Initial Assessment:  Oxygen Initial Assessment - 04/03/19 1221      Home Oxygen   Home Oxygen Device None    Sleep Oxygen Prescription CPAP    Home Exercise Oxygen Prescription None    Home at Rest Exercise Oxygen Prescription None    Compliance with Home Oxygen Use Yes      Initial 6 min Walk   Oxygen Used None      Program Oxygen Prescription   Program Oxygen Prescription None      Intervention   Short Term Goals To learn and exhibit compliance with exercise, home and travel O2 prescription;To learn and understand importance of monitoring SPO2 with pulse oximeter and demonstrate accurate use of the pulse  oximeter.;To learn and understand importance of maintaining oxygen saturations>88%;To learn and demonstrate proper pursed lip breathing techniques or other breathing techniques.;To learn and demonstrate proper use of respiratory medications    Long  Term Goals Exhibits compliance with exercise, home and travel O2 prescription;Verbalizes importance of monitoring SPO2 with pulse oximeter and return demonstration;Maintenance of O2 saturations>88%;Exhibits proper breathing techniques, such as pursed lip breathing or other method taught during program session;Compliance with respiratory medication;Demonstrates proper use of MDI's           Oxygen Re-Evaluation:  Oxygen Re-Evaluation    Row Name 04/10/19 1107 05/24/19 1116 05/27/19 1019 07/15/19 1720       Program Oxygen Prescription   Program Oxygen Prescription None None None None      Home Oxygen   Home Oxygen Device None None None Home Concentrator    Sleep Oxygen Prescription CPAP CPAP CPAP Continuous;CPAP    Liters per minute -- 6 0 2    Home Exercise Oxygen Prescription None None None None    Home at Rest Exercise Oxygen Prescription None None None None    Compliance with Home Oxygen Use Yes Yes Yes Yes      Goals/Expected Outcomes   Short Term Goals To learn and exhibit compliance with exercise, home and travel O2 prescription;To learn and understand importance of monitoring SPO2 with pulse oximeter and demonstrate accurate use of the pulse oximeter.;To learn and understand importance of maintaining oxygen saturations>88%;To learn and demonstrate proper pursed lip breathing techniques or other breathing techniques.;To learn and demonstrate proper use of respiratory medications To learn and exhibit compliance with exercise, home and travel O2 prescription;To learn and understand importance of monitoring SPO2 with pulse oximeter and demonstrate accurate use of the pulse oximeter.;To learn and understand importance of maintaining oxygen  saturations>88%;To learn and demonstrate proper pursed lip breathing techniques or other breathing techniques.;To learn and demonstrate proper use of respiratory medications To learn and exhibit compliance with exercise, home and travel O2 prescription;To learn and demonstrate proper pursed lip breathing techniques or other breathing techniques.;To learn and demonstrate proper use of respiratory medications;To learn and understand importance of monitoring SPO2 with pulse oximeter and demonstrate accurate use of the pulse oximeter. To learn and demonstrate proper use of respiratory medications;To learn and understand importance  of maintaining oxygen saturations>88%;To learn and understand importance of monitoring SPO2 with pulse oximeter and demonstrate accurate use of the pulse oximeter.;To learn and exhibit compliance with exercise, home and travel O2 prescription;To learn and demonstrate proper pursed lip breathing techniques or other breathing techniques.    Long  Term Goals Exhibits compliance with exercise, home and travel O2 prescription;Verbalizes importance of monitoring SPO2 with pulse oximeter and return demonstration;Maintenance of O2 saturations>88%;Exhibits proper breathing techniques, such as pursed lip breathing or other method taught during program session;Compliance with respiratory medication;Demonstrates proper use of MDI's Exhibits compliance with exercise, home and travel O2 prescription;Verbalizes importance of monitoring SPO2 with pulse oximeter and return demonstration;Maintenance of O2 saturations>88%;Exhibits proper breathing techniques, such as pursed lip breathing or other method taught during program session;Compliance with respiratory medication;Demonstrates proper use of MDI's Exhibits compliance with exercise, home and travel O2 prescription;Verbalizes importance of monitoring SPO2 with pulse oximeter and return demonstration;Exhibits proper breathing techniques, such as pursed  lip breathing or other method taught during program session;Compliance with respiratory medication;Demonstrates proper use of MDI's Compliance with respiratory medication;Demonstrates proper use of MDI's;Exhibits proper breathing techniques, such as pursed lip breathing or other method taught during program session;Maintenance of O2 saturations>88%;Verbalizes importance of monitoring SPO2 with pulse oximeter and return demonstration;Exhibits compliance with exercise, home and travel O2 prescription    Comments Reviewed PLB technique with pt.  Talked about how it works and it's importance in maintaining their exercise saturations. Talking to doctor about BIPAP or diphibulator machine. Reviewed PLB and diaphramic breathing. She does have a pulse oximeter to check her oxygen saturation at home. Explained why it is important to have one. Reviewed that oxygen saturations should be 88 percent and above. Patient has her smart watch to check oxygen and heart rate. Samantha Greene is back from [place] and is now going to be on home oxygen at night, She wears her CPAP routinely and is going to bleed in oxygen into her CPAP machine.    Goals/Expected Outcomes Short: Become more profiecient at using PLB.   Long: Become independent at using PLB. Short: Become more profiecient at using PLB.   Long: Become independent at using PLB. Short: monitor oxygen at home with exertion. Long: maintain oxygen saturations above 88 percent independently. Short: Use CPAP machine with oxygen. Long: maintain oxygen device and CPAP o2 delivery independently.           Oxygen Discharge (Final Oxygen Re-Evaluation):  Oxygen Re-Evaluation - 07/15/19 1720      Program Oxygen Prescription   Program Oxygen Prescription None      Home Oxygen   Home Oxygen Device Home Concentrator    Sleep Oxygen Prescription Continuous;CPAP    Liters per minute 2    Home Exercise Oxygen Prescription None    Home at Rest Exercise Oxygen Prescription None     Compliance with Home Oxygen Use Yes      Goals/Expected Outcomes   Short Term Goals To learn and demonstrate proper use of respiratory medications;To learn and understand importance of maintaining oxygen saturations>88%;To learn and understand importance of monitoring SPO2 with pulse oximeter and demonstrate accurate use of the pulse oximeter.;To learn and exhibit compliance with exercise, home and travel O2 prescription;To learn and demonstrate proper pursed lip breathing techniques or other breathing techniques.    Long  Term Goals Compliance with respiratory medication;Demonstrates proper use of MDI's;Exhibits proper breathing techniques, such as pursed lip breathing or other method taught during program session;Maintenance of O2 saturations>88%;Verbalizes importance of monitoring SPO2 with  pulse oximeter and return demonstration;Exhibits compliance with exercise, home and travel O2 prescription    Comments Samantha Greene is back from [place] and is now going to be on home oxygen at night, She wears her CPAP routinely and is going to bleed in oxygen into her CPAP machine.    Goals/Expected Outcomes Short: Use CPAP machine with oxygen. Long: maintain oxygen device and CPAP o2 delivery independently.           Initial Exercise Prescription:  Initial Exercise Prescription - 04/09/19 1500      Treadmill   MPH 2.2    Grade 1.5    Minutes 15    METs 3.1      Recumbant Bike   Level 2    RPM 50    Watts 45    Minutes 15    METs 3.1      Arm Ergometer   Level 1    RPM 30    Minutes 15    METs 2.5      Elliptical   Level 1    Speed 3    Minutes 15      Intensity   THRR 40-80% of Max Heartrate 116-153    Ratings of Perceived Exertion 11-15    Perceived Dyspnea 0-4      Resistance Training   Training Prescription Yes    Weight 3 lb    Reps 10-15           Perform Capillary Blood Glucose checks as needed.  Exercise Prescription Changes:  Exercise Prescription Changes    Row  Name 04/11/19 1200 05/07/19 1500 05/22/19 1100 06/03/19 1400 07/02/19 1500     Response to Exercise   Blood Pressure (Admit) 130/78 136/78 124/82 118/78 106/76   Blood Pressure (Exercise) 170/82 152/82 162/80 180/98 150/70   Blood Pressure (Exit) 116/84 132/70 126/74 132/78 122/72   Heart Rate (Admit) 72 bpm 86 bpm 83 bpm 94 bpm 88 bpm   Heart Rate (Exercise) 101 bpm 120 bpm 122 bpm 116 bpm 144 bpm   Heart Rate (Exit) 95 bpm 98 bpm 100 bpm 104 bpm 90 bpm   Oxygen Saturation (Admit) 98 % 98 % 95 % 97 % 98 %   Oxygen Saturation (Exercise) 95 % 94 % 94 % 95 % 94 %   Oxygen Saturation (Exit) 96 % 98 % 96 % 94 % 96 %   Rating of Perceived Exertion (Exercise) 16 15 14 15 15    Perceived Dyspnea (Exercise) 3 3 2 3 3    Symptoms none none none none none   Duration Continue with 30 min of aerobic exercise without signs/symptoms of physical distress. Continue with 30 min of aerobic exercise without signs/symptoms of physical distress. Continue with 30 min of aerobic exercise without signs/symptoms of physical distress. Continue with 30 min of aerobic exercise without signs/symptoms of physical distress. Continue with 30 min of aerobic exercise without signs/symptoms of physical distress.   Intensity THRR unchanged THRR unchanged THRR unchanged THRR unchanged THRR unchanged     Progression   Progression -- Continue to progress workloads to maintain intensity without signs/symptoms of physical distress. Continue to progress workloads to maintain intensity without signs/symptoms of physical distress. Continue to progress workloads to maintain intensity without signs/symptoms of physical distress. Continue to progress workloads to maintain intensity without signs/symptoms of physical distress.   Average METs -- 2.8 3.07 3.4 3.5     Resistance Training   Training Prescription Yes Yes Yes Yes Yes   Weight  3 lb 3 lb 3 lb 3 lb 3 lb   Reps 10-15 10-15 10-15 10-15 10-15     Interval Training   Interval  Training No No No No No     Treadmill   MPH -- -- -- -- 2.9   Grade -- -- -- -- 2   Minutes -- -- -- -- 15   METs -- -- -- -- 4     Recumbant Bike   Level 2 3 3 5  --   RPM 50 50 -- 50 --   Watts 15 -- 30 36 --   Minutes 15 15 15 15  --   METs 2.47 3.39 3.31 3 --     Arm Ergometer   Level 1 1 1 1  --   RPM 30 30 -- 30 --   Minutes 15 15 15 15  --   METs 2.8 2.2 3.2 3.4 --     Elliptical   Level -- -- -- -- 1   Minutes -- -- -- -- 15          Exercise Comments:   Exercise Goals and Review:  Exercise Goals    Row Name 04/09/19 1519             Exercise Goals   Increase Physical Activity Yes       Intervention Provide advice, education, support and counseling about physical activity/exercise needs.;Develop an individualized exercise prescription for aerobic and resistive training based on initial evaluation findings, risk stratification, comorbidities and participant's personal goals.       Expected Outcomes Short Term: Attend rehab on a regular basis to increase amount of physical activity.;Long Term: Add in home exercise to make exercise part of routine and to increase amount of physical activity.;Long Term: Exercising regularly at least 3-5 days a week.       Increase Strength and Stamina Yes       Intervention Provide advice, education, support and counseling about physical activity/exercise needs.;Develop an individualized exercise prescription for aerobic and resistive training based on initial evaluation findings, risk stratification, comorbidities and participant's personal goals.       Expected Outcomes Short Term: Increase workloads from initial exercise prescription for resistance, speed, and METs.;Short Term: Perform resistance training exercises routinely during rehab and add in resistance training at home;Long Term: Improve cardiorespiratory fitness, muscular endurance and strength as measured by increased METs and functional capacity (6MWT)       Able to  understand and use rate of perceived exertion (RPE) scale Yes       Intervention Provide education and explanation on how to use RPE scale       Expected Outcomes Short Term: Able to use RPE daily in rehab to express subjective intensity level;Long Term:  Able to use RPE to guide intensity level when exercising independently       Able to understand and use Dyspnea scale Yes       Intervention Provide education and explanation on how to use Dyspnea scale       Expected Outcomes Short Term: Able to use Dyspnea scale daily in rehab to express subjective sense of shortness of breath during exertion;Long Term: Able to use Dyspnea scale to guide intensity level when exercising independently       Knowledge and understanding of Target Heart Rate Range (THRR) Yes       Intervention Provide education and explanation of THRR including how the numbers were predicted and where they are located for reference  Expected Outcomes Short Term: Able to state/look up THRR;Short Term: Able to use daily as guideline for intensity in rehab;Long Term: Able to use THRR to govern intensity when exercising independently       Able to check pulse independently Yes       Intervention Provide education and demonstration on how to check pulse in carotid and radial arteries.;Review the importance of being able to check your own pulse for safety during independent exercise       Expected Outcomes Short Term: Able to explain why pulse checking is important during independent exercise;Long Term: Able to check pulse independently and accurately       Understanding of Exercise Prescription Yes       Intervention Provide education, explanation, and written materials on patient's individual exercise prescription       Expected Outcomes Short Term: Able to explain program exercise prescription;Long Term: Able to explain home exercise prescription to exercise independently              Exercise Goals Re-Evaluation :  Exercise  Goals Re-Evaluation    Row Name 04/10/19 1105 04/11/19 1300 04/24/19 0933 05/07/19 1540 05/13/19 1011     Exercise Goal Re-Evaluation   Exercise Goals Review Increase Physical Activity;Increase Strength and Stamina;Understanding of Exercise Prescription;Able to understand and use Dyspnea scale;Able to understand and use rate of perceived exertion (RPE) scale;Knowledge and understanding of Target Heart Rate Range (THRR) Increase Physical Activity;Increase Strength and Stamina;Able to understand and use rate of perceived exertion (RPE) scale;Able to understand and use Dyspnea scale;Knowledge and understanding of Target Heart Rate Range (THRR);Able to check pulse independently -- Increase Physical Activity;Increase Strength and Stamina;Able to understand and use rate of perceived exertion (RPE) scale;Able to understand and use Dyspnea scale;Knowledge and understanding of Target Heart Rate Range (THRR);Able to check pulse independently;Understanding of Exercise Prescription Increase Physical Activity;Increase Strength and Stamina;Able to understand and use rate of perceived exertion (RPE) scale;Able to understand and use Dyspnea scale;Knowledge and understanding of Target Heart Rate Range (THRR);Able to check pulse independently;Understanding of Exercise Prescription   Comments Reviewed RPE scale, THR and program prescription with pt today.  Pt voiced understanding and was given a copy of goals to take home. -- Out sick since last review. Sharese is just returning after being ill. Willette is using her 5 lb weights at home.  This EP will give info on [employer]   Expected Outcomes Short: Use RPE daily to regulate intensity. Long: Follow program prescription in THR. -- -- Short : build back up after being ill Long: build overall stamina Short: consider walking at the mall or water aerobics  Long:  exercise 3-5 days per week   Row Name 05/22/19 1008 05/24/19 1113 05/27/19 1016 06/03/19 1427 06/17/19 1438      Exercise Goal Re-Evaluation   Exercise Goals Review Increase Physical Activity;Increase Strength and Stamina;Understanding of Exercise Prescription Increase Physical Activity;Increase Strength and Stamina;Understanding of Exercise Prescription Increase Physical Activity;Increase Strength and Stamina;Understanding of Exercise Prescription Increase Physical Activity;Increase Strength and Stamina;Able to understand and use rate of perceived exertion (RPE) scale;Able to understand and use Dyspnea scale;Knowledge and understanding of Target Heart Rate Range (THRR);Able to check pulse independently;Understanding of Exercise Prescription --   Comments Samantha Greene is doing well in rehab.  She is excited to be back and enjoys getting in her exericse.  She is doing 2.8 mph on treadmill.  We will continue to monitor her progress. Samantha Greene is doing well in rehab.  She is excited  to be back and enjoys getting in her exericse.  5 lb weights at home 1x/week 10 minutes. Walking at home for 30 minutes 2x/week trail with hills: breathing gets difficult, not able to talk during hills, will stop to catch breath. She is not doing well with exercise at home and would like to start going to [employer] again. Her doctor states that she does not want her to go due to covid. She is due for her second vaccine soon and will ask her doctor if she will be able to go after her vaccince. Samantha Greene is up to level 5 on the XR.  Staff will encourage increasing weight strength training. Out since last review. Last attended on 4/30   Expected Outcomes Short: Increase workload on XR  Long: Continue to improve stamina. Short: working towards 1 hour for walking at home  Long: Continue to improve stamina. Short: ask doctor if she can go back to the gym after her vaccine. Long: Start going to the gym 2 days a week. Short: increase to 4-5 lb for strength work Long: improve overall MET level and stamina --   Row Name 07/02/19 1534 07/15/19 1742            Exercise Goal Re-Evaluation   Exercise Goals Review Increase Physical Activity;Increase Strength and Stamina;Able to understand and use rate of perceived exertion (RPE) scale;Knowledge and understanding of Target Heart Rate Range (THRR) Increase Physical Activity;Increase Strength and Stamina      Comments Samantha Greene has switched to a different time slot that works better for her.  She has tried the ellpitical an dit si challenging for her. Staff will monitor progress. Patient is going to start going to [employer] for exercise. It will help her keep her strength up and improve her mood.      Expected Outcomes Short: get back to consistent attendance  Long:  increase stamina Short: start going to [employer]. Long: maintain a workout routine post [facility].             Discharge Exercise Prescription (Final Exercise Prescription Changes):  Exercise Prescription Changes - 07/02/19 1500      Response to Exercise   Blood Pressure (Admit) 106/76    Blood Pressure (Exercise) 150/70    Blood Pressure (Exit) 122/72    Heart Rate (Admit) 88 bpm    Heart Rate (Exercise) 144 bpm    Heart Rate (Exit) 90 bpm    Oxygen Saturation (Admit) 98 %    Oxygen Saturation (Exercise) 94 %    Oxygen Saturation (Exit) 96 %    Rating of Perceived Exertion (Exercise) 15    Perceived Dyspnea (Exercise) 3    Symptoms none    Duration Continue with 30 min of aerobic exercise without signs/symptoms of physical distress.    Intensity THRR unchanged      Progression   Progression Continue to progress workloads to maintain intensity without signs/symptoms of physical distress.    Average METs 3.5      Resistance Training   Training Prescription Yes    Weight 3 lb    Reps 10-15      Interval Training   Interval Training No      Treadmill   MPH 2.9    Grade 2    Minutes 15    METs 4      Elliptical   Level 1    Minutes 15           Nutrition:  Target Goals:  Understanding of nutrition  guidelines, daily intake of sodium <1557m, cholesterol <2060m calories 30% from fat and 7% or less from saturated fats, daily to have 5 or more servings of fruits and vegetables.  Education: Controlling Sodium/Reading Food Labels -Group verbal and written material supporting the discussion of sodium use in heart healthy nutrition. Review and explanation with models, verbal and written materials for utilization of the food label.   Education: General Nutrition Guidelines/Fats and Fiber: -Group instruction provided by verbal, written material, models and posters to present the general guidelines for heart healthy nutrition. Gives an explanation and review of dietary fats and fiber.   Biometrics:    Nutrition Therapy Plan and Nutrition Goals:   Nutrition Assessments:   MEDIFICTS Score Key:          ?70 Need to make dietary changes          40-70 Heart Healthy Diet         ? 40 Therapeutic Level Cholesterol Diet  Nutrition Goals Re-Evaluation:  Nutrition Goals Re-Evaluation    [place] 06/24/19 1726             Goals   Nutrition Goal ST: Pt wants to try hello fresh, stress and sleep management, exercise nutrition (pre/post workout meals/snacks) LT: Weight loss 50lb (5-10lbs now)       Comment Pt discouraged that she is not losing weight. Discussed weight cycling, set point weight, health vs weight, role of stress and sleep in weight, role of lean muscle tissue and exercise MNT, empty calories such as sugar sweetened beverages.       Expected Outcome ST: Pt wants to try hello fresh, stress and sleep management, exercise nutrition (pre/post workout meals/snacks) LT: Weight loss 50lb (5-10lbs now)              Nutrition Goals Discharge (Final Nutrition Goals Re-Evaluation):  Nutrition Goals Re-Evaluation - 06/24/19 1726      Goals   Nutrition Goal ST: Pt wants to try hello fresh, stress and sleep management, exercise nutrition (pre/post workout meals/snacks) LT: Weight loss  50lb (5-10lbs now)    Comment Pt discouraged that she is not losing weight. Discussed weight cycling, set point weight, health vs weight, role of stress and sleep in weight, role of lean muscle tissue and exercise MNT, empty calories such as sugar sweetened beverages.    Expected Outcome ST: Pt wants to try hello fresh, stress and sleep management, exercise nutrition (pre/post workout meals/snacks) LT: Weight loss 50lb (5-10lbs now)           Psychosocial: Target Goals: Acknowledge presence or absence of significant depression and/or stress, maximize coping skills, provide positive support system. Participant is able to verbalize types and ability to use techniques and skills needed for reducing stress and depression.   Education: Depression - Provides group verbal and written instruction on the correlation between heart/lung disease and depressed mood, treatment options, and the stigmas associated with seeking treatment.   Education: Sleep Hygiene -Provides group verbal and written instruction about how sleep can affect your health.  Define sleep hygiene, discuss sleep cycles and impact of sleep habits. Review good sleep hygiene tips.    Education: Stress and Anxiety: - Provides group verbal and written instruction about the health risks of elevated stress and causes of high stress.  Discuss the correlation between heart/lung disease and anxiety and treatment options. Review healthy ways to manage with stress and anxiety.   Initial Review & Psychosocial Screening:  Initial Psych Review &  Screening - 04/03/19 1219      Initial Review   Current issues with Current Psychotropic Meds;Current Sleep Concerns;Current Depression;Current Stress Concerns    Source of Stress Concerns Chronic Illness;Family;Poor Coping Skills;Unable to participate in former interests or hobbies;Unable to perform yard/household activities    Comments She stresses about many things.  She has been caring for her aunt  over the past year.  She has lost several family members to suicide and murder. She wants to breathe better to be able to do more at home.      Family Dynamics   Good Support System? Yes   Husband, aunt, son   Comments Recent loss of family and friends (4 in the last two months)      Barriers   Psychosocial barriers to participate in program The patient should benefit from training in stress management and relaxation.;Psychosocial barriers identified (see note)      Screening Interventions   Interventions Encouraged to exercise;To provide support and resources with identified psychosocial needs;Provide feedback about the scores to participant    Expected Outcomes Short Term goal: Utilizing psychosocial counselor, staff and physician to assist with identification of specific Stressors or current issues interfering with healing process. Setting desired goal for each stressor or current issue identified.;Long Term Goal: Stressors or current issues are controlled or eliminated.;Short Term goal: Identification and review with participant of any Quality of Life or Depression concerns found by scoring the questionnaire.;Long Term goal: The participant improves quality of Life and PHQ9 Scores as seen by post scores and/or verbalization of changes           Quality of Life Scores:  Scores of 19 and below usually indicate a poorer quality of life in these areas.  A difference of  2-3 points is a clinically meaningful difference.  A difference of 2-3 points in the total score of the Quality of Life Index has been associated with significant improvement in overall quality of life, self-image, physical symptoms, and general health in studies assessing change in quality of life.  PHQ-9: Recent Review Flowsheet Data    Depression screen [facility] 2/9 07/11/2019 05/24/2019 04/09/2019 10/29/2018   Decreased Interest 3 2 3 3    Down, Depressed, Hopeless 3 2 3 3    PHQ - 2 Score 6 4 6 6    Altered sleeping 3 3 3 1     Tired, decreased energy 3 3 3 3    Change in appetite 3 3 3 3    Feeling bad or failure about yourself  3 2 2 3    Trouble concentrating 3 3 3 3    Moving slowly or fidgety/restless 0 0 1 2   Suicidal thoughts 0 0 0 0   PHQ-9 Score 21 18 21 21    Difficult doing work/chores - Extremely dIfficult - Very difficult     Interpretation of Total Score  Total Score Depression Severity:  1-4 = Minimal depression, 5-9 = Mild depression, 10-14 = Moderate depression, 15-19 = Moderately severe depression, 20-27 = Severe depression   Psychosocial Evaluation and Intervention:  Psychosocial Evaluation - 04/03/19 1226      Psychosocial Evaluation & Interventions   Interventions Stress management education;Encouraged to exercise with the program and follow exercise prescription    Comments Selinda Eon is coming back to rehab after taking time off to help care for her aunt over the past year.  Seh has had a rough few months after losing family to suicide and murder.  She has a significant history of depression and  anxiety but feels that her meds are helping to keep it manageable for her.  She really is looking forward to returning to rehab again! So much so that she turned up in person for her phone call today!  She wants to work on her breathing and lose the weight she has gained over the last few months.    Expected Outcomes Short: Attend rehab regularly and use exerise as a stress outlet.  Long: Work on weight loss and breathing to be more functional.    Continue Psychosocial Services  Follow up required by staff           Psychosocial Re-Evaluation:  Psychosocial Re-Evaluation    Iona Name 05/13/19 1023 05/13/19 1026 05/24/19 1031 07/15/19 1723       Psychosocial Re-Evaluation   Current issues with Current Sleep Concerns;Current Stress Concerns -- Current Sleep Concerns;Current Stress Concerns;Current Anxiety/Panic;Current Psychotropic Meds Current Sleep Concerns;Current Depression;History of  Depression;Current Psychotropic Meds    Comments Samantha Greene sees her Dr today - she still hasnt been sleeping well even using melatonin and CPAP.  They may switch to Bipap Samantha Greene is calling her Dr today neurologist) - she still hasnt been sleeping well even using melatonin and CPAP.  They may switch to Bipap Pt is on wellbutrin and lexapro to help with anxiety, but reports that it has not been helping her; pt has be on this medication for 1 year. Pt reports not seeing therapist for years. Encouraged pt to tlak to MD about medications management as well as looking into therpay. Suggested [facility] therapy as they give financial aid and flexible schedules and therapy. Encouraged pt to practice good sleep hygiene techniques as well as some basic cognitive behavioral therapy tools. Pt reports sleep apnea is affecting her sleep. Patient is going to start wearing her CPAP with 2 liters of oxygen bled in. She is hoping that her oxygen bleed in would help her sleep better and help her overall health. Her neurologist wants her to see a psychologist.She is waiting on her insurance to see if she can start talking to a mental health counselor    Expected Outcomes Short: follow up with Dr Laverta Baltimore: improve sleep quality -- Short: follow up with MD and look into therapy options.  Long: improve sleep quality, reduce anxiety and stress Short: start talking to a mental health counselor. Long: maintain mental health and overall stress levels.    Interventions -- -- Stress management education;Encouraged to attend [facility] for the exercise;Relaxation education Encouraged to attend [facility] for the exercise    Continue Psychosocial Services  -- -- Follow up required by staff Follow up required by staff      Initial Review   Source of Stress Concerns -- -- Chronic Illness;Family;Poor Coping Skills;Unable to participate in former interests or hobbies;Unable to perform yard/household  activities;Financial --           Psychosocial Discharge (Final Psychosocial Re-Evaluation):  Psychosocial Re-Evaluation - 07/15/19 1723      Psychosocial Re-Evaluation   Current issues with Current Sleep Concerns;Current Depression;History of Depression;Current Psychotropic Meds    Comments Patient is going to start wearing her CPAP with 2 liters of oxygen bled in. She is hoping that her oxygen bleed in would help her sleep better and help her overall health. Her neurologist wants her to see a psychologist.She is waiting on her insurance to see if she can start talking to a mental health counselor    Expected Outcomes Short: start talking to a  mental health counselor. Long: maintain mental health and overall stress levels.    Interventions Encouraged to attend [facility] for the exercise    Continue Psychosocial Services  Follow up required by staff           Education: Education Goals: Education classes will be provided on a weekly basis, covering required topics. Participant will state understanding/return demonstration of topics presented.  Learning Barriers/Preferences:  Learning Barriers/Preferences - 04/03/19 1219      Learning Barriers/Preferences   Learning Barriers Hearing    Learning Preferences None           General Pulmonary Education Topics:  Infection Prevention: - Provides verbal and written material to individual with discussion of infection control including proper hand washing and proper equipment cleaning during exercise session.   Pulmonary Rehab from 10/11/2018 in [facility] Cardiac and Pulmonary Rehab  Date 10/11/18  Educator AS  Instruction Review Code 1- Verbalizes Understanding      Falls Prevention: - Provides verbal and written material to individual with discussion of falls prevention and safety.   Pulmonary Rehab from 10/11/2018 in [facility] Cardiac and Pulmonary Rehab  Date 10/11/18  Educator AS  Instruction Review Code 1- Verbalizes  Understanding      Chronic Lung Diseases: - Group verbal and written instruction to review updates, respiratory medications, advancements in procedures and treatments. Discuss use of supplemental oxygen including available portable oxygen systems, continuous and intermittent flow rates, concentrators, personal use and safety guidelines. Review proper use of inhaler and spacers. Provide informative websites for self-education.    Energy Conservation: - Provide group verbal and written instruction for methods to conserve energy, plan and organize activities. Instruct on pacing techniques, use of adaptive equipment and posture/positioning to relieve shortness of breath.   Triggers and Exacerbations: - Group verbal and written instruction to review types of environmental triggers and ways to prevent exacerbations. Discuss weather changes, air quality and the benefits of nasal washing. Review warning signs and symptoms to help prevent infections. Discuss techniques for effective airway clearance, coughing, and vibrations.   AED/CPR: - Group verbal and written instruction with the use of models to demonstrate the basic use of the AED with the basic ABC's of resuscitation.   Anatomy and Physiology of the Lungs: - Group verbal and written instruction with the use of models to provide basic lung anatomy and physiology related to function, structure and complications of lung disease.   Anatomy & Physiology of the Heart: - Group verbal and written instruction and models provide basic cardiac anatomy and physiology, with the coronary electrical and arterial systems. Review of Valvular disease and Heart Failure   Cardiac Medications: - Group verbal and written instruction to review commonly prescribed medications for heart disease. Reviews the medication, class of the drug, and side effects.   Other: -Provides group and verbal instruction on various topics (see comments)   Knowledge  Questionnaire Score:  Knowledge Questionnaire Score - 04/09/19 1518      Knowledge Questionnaire Score   Pre Score 15/18            Core Components/Risk Factors/Patient Goals at Admission:  Personal Goals and Risk Factors at Admission - 04/09/19 1502      Core Components/Risk Factors/Patient Goals on Admission   Intervention Weight Management: Develop a combined nutrition and exercise program designed to reach desired caloric intake, while maintaining appropriate intake of nutrient and fiber, sodium and fats, and appropriate energy expenditure required for the weight goal.;Weight Management: Provide education and  appropriate resources to help participant work on and attain dietary goals.;Weight Management/Obesity: Establish reasonable short term and long term weight goals.;Obesity: Provide education and appropriate resources to help participant work on and attain dietary goals.    Admit Weight 226 lb (102.5 kg)    Goal Weight: Short Term 215 lb (97.5 kg)    Goal Weight: Long Term 200 lb (90.7 kg)    Expected Outcomes Short Term: Continue to assess and modify interventions until short term weight is achieved;Long Term: Adherence to nutrition and physical activity/exercise program aimed toward attainment of established weight goal;Weight Loss: Understanding of general recommendations for a balanced deficit meal plan, which promotes 1-2 lb weight loss per week and includes a negative energy balance of [phone] kcal/d;Understanding recommendations for meals to include 15-35% energy as protein, 25-35% energy from fat, 35-60% energy from carbohydrates, less than 253m of dietary cholesterol, 20-35 gm of total fiber daily;Understanding of distribution of calorie intake throughout the day with the consumption of 4-5 meals/snacks    Intervention Provide education on lifestyle modifcations including regular physical activity/exercise, weight management, moderate sodium restriction and increased consumption  of fresh fruit, vegetables, and low fat dairy, alcohol moderation, and smoking cessation.;Monitor prescription use compliance.    Expected Outcomes Short Term: Continued assessment and intervention until BP is < 140/910mHG in hypertensive participants. < 130/8022mG in hypertensive participants with diabetes, heart failure or chronic kidney disease.;Long Term: Maintenance of blood pressure at goal levels.           Education:Diabetes - Individual verbal and written instruction to review signs/symptoms of diabetes, desired ranges of glucose level fasting, after meals and with exercise. Acknowledge that pre and post exercise glucose checks will be done for 3 sessions at entry of program.   Education: Know Your Numbers and Risk Factors: -Group verbal and written instruction about important numbers in your health.  Discussion of what are risk factors and how they play a role in the disease process.  Review of Cholesterol, Blood Pressure, Diabetes, and BMI and the role they play in your overall health.   Core Components/Risk Factors/Patient Goals Review:   Goals and Risk Factor Review    Row Name 05/13/19 1008 05/24/19 1118 05/27/19 1023 07/15/19 1727       Core Components/Risk Factors/Patient Goals Review   Personal Goals Review Weight Management/Obesity;Hypertension;Lipids Weight Management/Obesity;Hypertension;Lipids;Stress;Develop more efficient breathing techniques such as purse lipped breathing and diaphragmatic breathing and practicing self-pacing with activity.;Improve shortness of breath with ADL's Weight Management/Obesity;Improve shortness of breath with ADL's;Lipids Weight Management/Obesity;Hypertension;Lipids;Improve shortness of breath with ADL's    Review MicSharyn Lulld first Covid vaccine last week.  She felt a little scratchy throat but is better now.  She is on allergy and cold medicine.  She is taking all other meds as directed.  Weight is staying about the same. With her new  medication her lipid levels have been normal range and BP running 128-130s/70-low 80s. Taking medication as directed. Spoke to patient about their shortness of breath and what they can do to improve. Patient has been informed of breathing techniques when starting the program. Patient is informed to tell staff if they have had any med changes and that certain meds they are taking or not taking can be causing shortness of breath. Patient has checked her lipids recently and states that it was good. She does not have a blood pressure machine at home to check and does not want to pay for it. If she feels like it is  high she checks it at [facility] or other stores. She has been out of town taking care of her sister and has not has time to exercise.She was able to walk a little in [place] when she was away.    Expected Outcomes Short:  continue with current plan Long:  manage risk factors Short:  continue with current plan Long:  manage risk factors Short: Attend [facility] regularly to improve shortness of breath with ADL's. Long: maintain independence with ADL's Short: obtain a blood pressure machine when she is able to. Long: Check blood pressure at home.           Core Components/Risk Factors/Patient Goals at Discharge (Final Review):   Goals and Risk Factor Review - 07/15/19 1727      Core Components/Risk Factors/Patient Goals Review   Personal Goals Review Weight Management/Obesity;Hypertension;Lipids;Improve shortness of breath with ADL's    Review Patient has checked her lipids recently and states that it was good. She does not have a blood pressure machine at home to check and does not want to pay for it. If she feels like it is high she checks it at [facility] or other stores. She has been out of town taking care of her sister and has not has time to exercise.She was able to walk a little in [place] when she was away.    Expected Outcomes Short: obtain a blood pressure machine when she is able to. Long: Check blood  pressure at home.           ITP Comments:  ITP Comments    Row Name 04/03/19 1240 04/10/19 1104 04/24/19 0553 05/22/19 0533 06/17/19 1437   ITP Comments Completed virtual orientation today.  EP evaluation is scheduled for Tuesday 3/9 at 2pm.  Documentation for diagnosis can be found in [facility] encounter 03/18/19.  She will restart on session number 8. First full day of exercise!  Patient was oriented to gym and equipment including functions, settings, policies, and procedures.  Patient's individual exercise prescription and treatment plan were reviewed.  All starting workloads were established based on the results of the 6 minute walk test done at initial orientation visit.  The plan for exercise progression was also introduced and progression will be customized based on patient's performance and goals. 30 day chart review completed. ITP sent to Dr Zachery Dakins Medical Director, for review,changes as needed and signature. Continue with ITP if no changes requested    New to program 30 Day review completed. Medical Director review done, changes made as directed,and approval shown by signature of Market researcher. Michlle called out today for a doctor's appt.  Last attended on 4/30   Row Name 06/19/19 0535 07/17/19 0521         ITP Comments 30 Day review completed. ITP review done, changes made as directed,and approval shown by signature of  Scientist, research (life sciences). 30 Day review completed. Medical Director ITP review done, changes made as directed,and signed approval by Medical Director.             Comments: 30 Day review completed. Medical Director ITP review done, changes made as directed, and signed approval by Medical Director.

## 2019-07-18 ENCOUNTER — Encounter: Payer: BC Managed Care – PPO | Admitting: *Deleted

## 2019-07-18 DIAGNOSIS — Z79899 Other long term (current) drug therapy: Secondary | ICD-10-CM | POA: Diagnosis not present

## 2019-07-18 DIAGNOSIS — I10 Essential (primary) hypertension: Secondary | ICD-10-CM | POA: Diagnosis not present

## 2019-07-18 DIAGNOSIS — E785 Hyperlipidemia, unspecified: Secondary | ICD-10-CM | POA: Diagnosis not present

## 2019-07-18 DIAGNOSIS — R0600 Dyspnea, unspecified: Secondary | ICD-10-CM | POA: Diagnosis not present

## 2019-07-18 DIAGNOSIS — R0609 Other forms of dyspnea: Secondary | ICD-10-CM

## 2019-07-18 DIAGNOSIS — F419 Anxiety disorder, unspecified: Secondary | ICD-10-CM | POA: Diagnosis not present

## 2019-07-18 DIAGNOSIS — Z7982 Long term (current) use of aspirin: Secondary | ICD-10-CM | POA: Diagnosis not present

## 2019-07-18 DIAGNOSIS — F329 Major depressive disorder, single episode, unspecified: Secondary | ICD-10-CM | POA: Diagnosis not present

## 2019-07-18 NOTE — Progress Notes (Signed)
Daily Session Note  Patient Details  Name: Samantha Greene MRN: [id number] Date of Birth: Aug 26, 1970 Referring Provider:     Pulmonary Rehab from 10/11/2018 in [facility] Cardiac and Pulmonary Rehab  Referring Provider McKeown      Encounter Date: 07/18/2019  Check In:  Session Check In - 07/18/19 1703      Check-In   Supervising physician immediately available to respond to emergencies See telemetry face sheet for immediately available ER MD    Location [facility]-Cardiac & Pulmonary Rehab    Staff Present [name], [name];Joseph [address] [place], [place], ACSM CEP, Exercise Physiologist    Virtual Visit No    Medication changes reported     No    Fall or balance concerns reported    No    Warm-up and Cool-down Performed on first and last piece of equipment    Resistance Training Performed Yes    VAD Patient? No    PAD/SET Patient? No      Pain Assessment   Currently in Pain? No/denies              Social History   Tobacco Use  Smoking Status Never Smoker  Smokeless Tobacco Never Used    Goals Met:  Independence with exercise equipment Exercise tolerated well No report of cardiac concerns or symptoms Strength training completed today  Goals Unmet:  Not Applicable  Comments: Pt able to follow exercise prescription today without complaint.  Will continue to monitor for progression.    Dr. Emily Filbert is Medical Director for [place] and [facility].

## 2019-07-19 ENCOUNTER — Other Ambulatory Visit: Payer: Self-pay | Admitting: Physician Assistant

## 2019-07-19 DIAGNOSIS — G4700 Insomnia, unspecified: Secondary | ICD-10-CM

## 2019-07-21 MED ORDER — ESZOPICLONE 3 MG PO TABS: ORAL_TABLET | ORAL | 3 refills | Status: DC

## 2019-07-22 ENCOUNTER — Other Ambulatory Visit: Payer: Self-pay

## 2019-07-22 ENCOUNTER — Encounter: Payer: BC Managed Care – PPO | Admitting: *Deleted

## 2019-07-22 DIAGNOSIS — E785 Hyperlipidemia, unspecified: Secondary | ICD-10-CM | POA: Diagnosis not present

## 2019-07-22 DIAGNOSIS — Z7982 Long term (current) use of aspirin: Secondary | ICD-10-CM | POA: Diagnosis not present

## 2019-07-22 DIAGNOSIS — F329 Major depressive disorder, single episode, unspecified: Secondary | ICD-10-CM | POA: Diagnosis not present

## 2019-07-22 DIAGNOSIS — F419 Anxiety disorder, unspecified: Secondary | ICD-10-CM | POA: Diagnosis not present

## 2019-07-22 DIAGNOSIS — R0609 Other forms of dyspnea: Secondary | ICD-10-CM

## 2019-07-22 DIAGNOSIS — Z79899 Other long term (current) drug therapy: Secondary | ICD-10-CM | POA: Diagnosis not present

## 2019-07-22 DIAGNOSIS — G4733 Obstructive sleep apnea (adult) (pediatric): Secondary | ICD-10-CM | POA: Diagnosis not present

## 2019-07-22 DIAGNOSIS — I10 Essential (primary) hypertension: Secondary | ICD-10-CM | POA: Diagnosis not present

## 2019-07-22 DIAGNOSIS — R0600 Dyspnea, unspecified: Secondary | ICD-10-CM | POA: Diagnosis not present

## 2019-07-22 NOTE — Progress Notes (Signed)
Daily Session Note  Patient Details  Name: Samantha Greene MRN: [id number] Date of Birth: Apr 28, 1970 Referring Provider:     Pulmonary Rehab from 10/11/2018 in [facility] Cardiac and Pulmonary Rehab  Referring Provider McKeown      Encounter Date: 07/22/2019  Check In:  Session Check In - 07/22/19 1701      Check-In   Supervising physician immediately available to respond to emergencies See telemetry face sheet for immediately available ER MD    Location [facility]-Cardiac & Pulmonary Rehab    Staff Present [name], [name];Joseph [address] [place], [place], ACSM CEP, Exercise Physiologist    Virtual Visit No    Medication changes reported     No    Fall or balance concerns reported    No    Warm-up and Cool-down Performed on first and last piece of equipment    Resistance Training Performed Yes    VAD Patient? No    PAD/SET Patient? No      Pain Assessment   Currently in Pain? No/denies              Social History   Tobacco Use  Smoking Status Never Smoker  Smokeless Tobacco Never Used    Goals Met:  Independence with exercise equipment Exercise tolerated well No report of cardiac concerns or symptoms Strength training completed today  Goals Unmet:  Not Applicable  Comments: Pt able to follow exercise prescription today without complaint.  Will continue to monitor for progression.    Dr. Emily Filbert is Medical Director for [place] and [facility].

## 2019-07-24 DIAGNOSIS — G4733 Obstructive sleep apnea (adult) (pediatric): Secondary | ICD-10-CM | POA: Diagnosis not present

## 2019-07-25 ENCOUNTER — Other Ambulatory Visit: Payer: Self-pay

## 2019-07-25 ENCOUNTER — Encounter: Payer: BC Managed Care – PPO | Admitting: *Deleted

## 2019-07-25 DIAGNOSIS — E785 Hyperlipidemia, unspecified: Secondary | ICD-10-CM | POA: Diagnosis not present

## 2019-07-25 DIAGNOSIS — I10 Essential (primary) hypertension: Secondary | ICD-10-CM | POA: Diagnosis not present

## 2019-07-25 DIAGNOSIS — R0609 Other forms of dyspnea: Secondary | ICD-10-CM

## 2019-07-25 DIAGNOSIS — F329 Major depressive disorder, single episode, unspecified: Secondary | ICD-10-CM | POA: Diagnosis not present

## 2019-07-25 DIAGNOSIS — Z7982 Long term (current) use of aspirin: Secondary | ICD-10-CM | POA: Diagnosis not present

## 2019-07-25 DIAGNOSIS — F419 Anxiety disorder, unspecified: Secondary | ICD-10-CM | POA: Diagnosis not present

## 2019-07-25 DIAGNOSIS — R0600 Dyspnea, unspecified: Secondary | ICD-10-CM | POA: Diagnosis not present

## 2019-07-25 DIAGNOSIS — Z79899 Other long term (current) drug therapy: Secondary | ICD-10-CM | POA: Diagnosis not present

## 2019-07-25 NOTE — Progress Notes (Signed)
Daily Session Note  Patient Details  Name: Samantha Greene MRN: [id number] Date of Birth: 01-01-1971 Referring Provider:     Pulmonary Rehab from 10/11/2018 in [facility] Cardiac and Pulmonary Rehab  Referring Provider McKeown      Encounter Date: 07/25/2019  Check In:  Session Check In - 07/25/19 1707      Check-In   Supervising physician immediately available to respond to emergencies See telemetry face sheet for immediately available ER MD    Location [facility]-Cardiac & Pulmonary Rehab    Staff Present [name], [name];Joseph [address] [place], [place], ACSM CEP, Exercise Physiologist    Virtual Visit No    Medication changes reported     No    Fall or balance concerns reported    No    Warm-up and Cool-down Performed on first and last piece of equipment    Resistance Training Performed Yes    VAD Patient? No    PAD/SET Patient? No      Pain Assessment   Currently in Pain? No/denies              Social History   Tobacco Use  Smoking Status Never Smoker  Smokeless Tobacco Never Used    Goals Met:  Independence with exercise equipment Exercise tolerated well No report of cardiac concerns or symptoms Strength training completed today  Goals Unmet:  Not Applicable  Comments: Pt able to follow exercise prescription today without complaint.  Will continue to monitor for progression.    Dr. Emily Filbert is Medical Director for [place] and [facility].

## 2019-07-29 ENCOUNTER — Encounter: Payer: BC Managed Care – PPO | Admitting: *Deleted

## 2019-07-29 ENCOUNTER — Other Ambulatory Visit: Payer: Self-pay

## 2019-07-29 DIAGNOSIS — Z79899 Other long term (current) drug therapy: Secondary | ICD-10-CM | POA: Diagnosis not present

## 2019-07-29 DIAGNOSIS — Z7982 Long term (current) use of aspirin: Secondary | ICD-10-CM | POA: Diagnosis not present

## 2019-07-29 DIAGNOSIS — F419 Anxiety disorder, unspecified: Secondary | ICD-10-CM | POA: Diagnosis not present

## 2019-07-29 DIAGNOSIS — F329 Major depressive disorder, single episode, unspecified: Secondary | ICD-10-CM | POA: Diagnosis not present

## 2019-07-29 DIAGNOSIS — E785 Hyperlipidemia, unspecified: Secondary | ICD-10-CM | POA: Diagnosis not present

## 2019-07-29 DIAGNOSIS — R0600 Dyspnea, unspecified: Secondary | ICD-10-CM | POA: Diagnosis not present

## 2019-07-29 DIAGNOSIS — I10 Essential (primary) hypertension: Secondary | ICD-10-CM | POA: Diagnosis not present

## 2019-07-29 DIAGNOSIS — R0609 Other forms of dyspnea: Secondary | ICD-10-CM

## 2019-07-29 NOTE — Progress Notes (Signed)
Daily Session Note  Patient Details  Name: Samantha Greene MRN: [id number] Date of Birth: July 04, 1970 Referring Provider:     Pulmonary Rehab from 10/11/2018 in [facility] Cardiac and Pulmonary Rehab  Referring Provider McKeown      Encounter Date: 07/29/2019  Check In:  Session Check In - 07/29/19 1702      Check-In   Supervising physician immediately available to respond to emergencies See telemetry face sheet for immediately available ER MD    Location [facility]-Cardiac & Pulmonary Rehab    Staff Present [name], [name];Joseph Foy [place], [place], ACSM CEP, Exercise Physiologist    Virtual Visit No    Medication changes reported     No    Fall or balance concerns reported    No    Warm-up and Cool-down Performed on first and last piece of equipment    Resistance Training Performed Yes    VAD Patient? No    PAD/SET Patient? No      Pain Assessment   Currently in Pain? No/denies              Social History   Tobacco Use  Smoking Status Never Smoker  Smokeless Tobacco Never Used    Goals Met:  Independence with exercise equipment Exercise tolerated well No report of cardiac concerns or symptoms Strength training completed today  Goals Unmet:  Not Applicable  Comments: Pt able to follow exercise prescription today without complaint.  Will continue to monitor for progression.    Dr. Emily Filbert is Medical Director for [place] and [facility].

## 2019-07-31 ENCOUNTER — Encounter: Payer: BC Managed Care – PPO | Admitting: *Deleted

## 2019-07-31 ENCOUNTER — Other Ambulatory Visit: Payer: Self-pay

## 2019-07-31 DIAGNOSIS — I10 Essential (primary) hypertension: Secondary | ICD-10-CM | POA: Diagnosis not present

## 2019-07-31 DIAGNOSIS — F419 Anxiety disorder, unspecified: Secondary | ICD-10-CM | POA: Diagnosis not present

## 2019-07-31 DIAGNOSIS — E785 Hyperlipidemia, unspecified: Secondary | ICD-10-CM | POA: Diagnosis not present

## 2019-07-31 DIAGNOSIS — Z7982 Long term (current) use of aspirin: Secondary | ICD-10-CM | POA: Diagnosis not present

## 2019-07-31 DIAGNOSIS — R0609 Other forms of dyspnea: Secondary | ICD-10-CM

## 2019-07-31 DIAGNOSIS — R0600 Dyspnea, unspecified: Secondary | ICD-10-CM | POA: Diagnosis not present

## 2019-07-31 DIAGNOSIS — F329 Major depressive disorder, single episode, unspecified: Secondary | ICD-10-CM | POA: Diagnosis not present

## 2019-07-31 DIAGNOSIS — Z79899 Other long term (current) drug therapy: Secondary | ICD-10-CM | POA: Diagnosis not present

## 2019-07-31 NOTE — Progress Notes (Signed)
Daily Session Note  Patient Details  Name: Samantha Greene MRN: [id number] Date of Birth: [birth year] Referring Provider:     Pulmonary Rehab from 10/11/2018 in [facility] Cardiac and Pulmonary Rehab  Referring Provider McKeown      Encounter Date: 07/31/2019  Check In:  Session Check In - 07/31/19 1706      Check-In   Supervising physician immediately available to respond to emergencies See telemetry face sheet for immediately available ER MD    Location [facility]-Cardiac & Pulmonary Rehab    Staff Present [name], [name];Laureen Owens Shark, BS, RRT, CPFT;Amanda Oletta Darter, BA, ACSM CEP, Exercise Physiologist    Virtual Visit No    Medication changes reported     No    Fall or balance concerns reported    No    Warm-up and Cool-down Performed on first and last piece of equipment    Resistance Training Performed Yes    VAD Patient? No    PAD/SET Patient? No      Pain Assessment   Currently in Pain? No/denies              Social History   Tobacco Use  Smoking Status Never Smoker  Smokeless Tobacco Never Used    Goals Met:  Independence with exercise equipment Exercise tolerated well No report of cardiac concerns or symptoms Strength training completed today  Goals Unmet:  Not Applicable  Comments: Pt able to follow exercise prescription today without complaint.  Will continue to monitor for progression.    Dr. Emily Filbert is Medical Director for [place] and [facility].

## 2019-08-01 ENCOUNTER — Encounter: Payer: BC Managed Care – PPO | Attending: Internal Medicine | Admitting: *Deleted

## 2019-08-01 ENCOUNTER — Other Ambulatory Visit: Payer: Self-pay

## 2019-08-01 DIAGNOSIS — R0600 Dyspnea, unspecified: Secondary | ICD-10-CM | POA: Insufficient documentation

## 2019-08-01 DIAGNOSIS — F329 Major depressive disorder, single episode, unspecified: Secondary | ICD-10-CM | POA: Diagnosis not present

## 2019-08-01 DIAGNOSIS — I10 Essential (primary) hypertension: Secondary | ICD-10-CM | POA: Insufficient documentation

## 2019-08-01 DIAGNOSIS — Z7982 Long term (current) use of aspirin: Secondary | ICD-10-CM | POA: Insufficient documentation

## 2019-08-01 DIAGNOSIS — Z79899 Other long term (current) drug therapy: Secondary | ICD-10-CM | POA: Insufficient documentation

## 2019-08-01 DIAGNOSIS — E785 Hyperlipidemia, unspecified: Secondary | ICD-10-CM | POA: Insufficient documentation

## 2019-08-01 DIAGNOSIS — F419 Anxiety disorder, unspecified: Secondary | ICD-10-CM | POA: Diagnosis not present

## 2019-08-01 DIAGNOSIS — R0609 Other forms of dyspnea: Secondary | ICD-10-CM

## 2019-08-01 NOTE — Progress Notes (Signed)
Daily Session Note  Patient Details  Name: Samantha Greene MRN: [id number] Date of Birth: [birth year] Referring Provider:     Pulmonary Rehab from 10/11/2018 in [facility] Cardiac and Pulmonary Rehab  Referring Provider McKeown      Encounter Date: 08/01/2019  Check In:  Session Check In - 08/01/19 1712      Check-In   Supervising physician immediately available to respond to emergencies See telemetry face sheet for immediately available ER MD    Location [facility]-Cardiac & Pulmonary Rehab    Staff Present [name], [name];Joseph Foy [place], [place], ACSM CEP, Exercise Physiologist    Virtual Visit No    Medication changes reported     No    Fall or balance concerns reported    No    Warm-up and Cool-down Performed on first and last piece of equipment    Resistance Training Performed Yes    VAD Patient? No    PAD/SET Patient? No      Pain Assessment   Currently in Pain? No/denies              Social History   Tobacco Use  Smoking Status Never Smoker  Smokeless Tobacco Never Used    Goals Met:  Independence with exercise equipment Exercise tolerated well No report of cardiac concerns or symptoms Strength training completed today  Goals Unmet:  Not Applicable  Comments: Pt able to follow exercise prescription today without complaint.  Will continue to monitor for progression.    Dr. Emily Filbert is Medical Director for [place] and [facility].

## 2019-08-07 ENCOUNTER — Ambulatory Visit: Payer: BC Managed Care – PPO

## 2019-08-07 ENCOUNTER — Other Ambulatory Visit: Payer: Self-pay | Admitting: Thoracic Surgery (Cardiothoracic Vascular Surgery)

## 2019-08-07 DIAGNOSIS — Z9889 Other specified postprocedural states: Secondary | ICD-10-CM

## 2019-08-08 ENCOUNTER — Ambulatory Visit: Payer: BC Managed Care – PPO

## 2019-08-08 ENCOUNTER — Other Ambulatory Visit: Payer: Self-pay

## 2019-08-08 ENCOUNTER — Encounter: Payer: Self-pay | Admitting: Obstetrics & Gynecology

## 2019-08-08 ENCOUNTER — Ambulatory Visit (INDEPENDENT_AMBULATORY_CARE_PROVIDER_SITE_OTHER): Payer: BC Managed Care – PPO

## 2019-08-08 ENCOUNTER — Ambulatory Visit: Payer: BC Managed Care – PPO | Admitting: Obstetrics & Gynecology

## 2019-08-08 VITALS — BP 120/80

## 2019-08-08 DIAGNOSIS — R9389 Abnormal findings on diagnostic imaging of other specified body structures: Secondary | ICD-10-CM | POA: Diagnosis not present

## 2019-08-08 DIAGNOSIS — N921 Excessive and frequent menstruation with irregular cycle: Secondary | ICD-10-CM | POA: Diagnosis not present

## 2019-08-08 NOTE — Progress Notes (Signed)
    Samantha Greene 1970-05-18 [id number]        49 y.o.  G1P0A1 Married  RP: Menometrorrhagia for Pelvic US  HPI: Normal menses every month until February 2021.  Since February 2021, patient has experienced increased flow during her.  And prolonged bleeding versus metrorrhagia every cycle.  No pelvic pain.  No abnormal discharge vaginally.  No fever.  Rarely sexually active, using condoms.   OB History  Gravida Para Term Preterm AB Living  1 0     0 0  SAB TAB Ectopic Multiple Live Births      0        # Outcome Date GA Lbr Len/2nd Weight Sex Delivery Anes PTL Lv  1 Gravida             Past medical history,surgical history, problem list, medications, allergies, family history and social history were all reviewed and documented in the EPIC chart.   Directed ROS with pertinent positives and negatives documented in the history of present illness/assessment and plan.  Exam:  Vitals:   08/08/19 1236  BP: 120/80   General appearance:  Normal  Pelvic US today: T/V images.  Anteverted uterus normal in size and shape measured at 8.14 x 3.98 x 3.53 cm.  No myometrial mass.  The endometrial lining is thickened measured at 7.0 mm on average, largest measurement was at 8.16 mm.  Cannot rule out endometrial polyp with vascular flow measured at 1.6 x 0.6 cm.  Both ovaries are normal in size with follicles, right ovary difficult to visualize due to patient's body habitus and intolerance to deep palpation.  Both transabdominal and transvaginal scanning performed.  No adnexal mass.  No free fluid in the posterior cul-de-sac.   Assessment/Plan:  49 y.o. G1P0000   1. Menometrorrhagia Menometrorrhagia since February 2021.  Pelvic ultrasound findings reviewed with patient.  Mildly thickened endometrium with probable endometrial polyp measured at 1.6 x 0.6 cm, with presence of vascular flow.  Uterus otherwise normal as well as ovaries.  2. Thickened endometrium Thickened endometrium with  probable endometrial polyp.  We will follow-up to investigate further with a sonohysterogram. - [place] Sonohysterogram; Future  Genia Del MD, 12:47 PM 08/08/2019

## 2019-08-08 NOTE — Patient Instructions (Signed)
1. Menometrorrhagia Menometrorrhagia since February 2021.  Pelvic ultrasound findings reviewed with patient.  Mildly thickened endometrium with probable endometrial polyp measured at 1.6 x 0.6 cm, with presence of vascular flow.  Uterus otherwise normal as well as ovaries.  2. Thickened endometrium Thickened endometrium with probable endometrial polyp.  We will follow-up to investigate further with a sonohysterogram. - [place] Sonohysterogram; Future  Marcelino Duster, it was a pleasure seeing you today!

## 2019-08-12 ENCOUNTER — Encounter: Payer: Self-pay | Admitting: *Deleted

## 2019-08-12 ENCOUNTER — Ambulatory Visit: Payer: BC Managed Care – PPO

## 2019-08-12 DIAGNOSIS — R0609 Other forms of dyspnea: Secondary | ICD-10-CM

## 2019-08-13 ENCOUNTER — Ambulatory Visit: Payer: BC Managed Care – PPO | Admitting: Thoracic Surgery (Cardiothoracic Vascular Surgery)

## 2019-08-14 ENCOUNTER — Ambulatory Visit: Payer: BC Managed Care – PPO

## 2019-08-14 ENCOUNTER — Encounter: Payer: Self-pay | Admitting: *Deleted

## 2019-08-14 DIAGNOSIS — R0609 Other forms of dyspnea: Secondary | ICD-10-CM

## 2019-08-14 NOTE — Progress Notes (Signed)
Pulmonary Individual Treatment Plan  Patient Details  Name: Samantha Greene MRN: [id number] Date of Birth: 26-Apr-1970 Referring Provider:     Pulmonary Rehab from 10/11/2018 in [facility] Cardiac and Pulmonary Rehab  Referring Provider [place]      Initial Encounter Date:    Pulmonary Rehab from 10/11/2018 in [facility] Cardiac and Pulmonary Rehab  Date 10/11/18      Visit Diagnosis: DOE (dyspnea on exertion)  Patient's Home Medications on Admission:  Current Outpatient Medications:  .  albuterol (VENTOLIN HFA) 108 (90 Base) MCG/ACT inhaler, INHALE 2 PUFFS INTO THE LUNGS EVERY 4 (FOUR) HOURS AS NEEDED FOR WHEEZING OR SHORTNESS OF BREATH., Disp: 18 g, Rfl: 2 .  aspirin 81 MG tablet, Take 81 mg by mouth daily., Disp: , Rfl:  .  baclofen (LIORESAL) 10 MG tablet, TAKE 1/2 TO 1 TABLET 1 OR 2 X /DAY AS NEEDED FOR MUSCLE SPASMS, Disp: 180 tablet, Rfl: 1 .  buPROPion (WELLBUTRIN XL) 300 MG 24 hr tablet, Take 1 tablet every Morning for Mood, Focus & Concentration, Disp: 90 tablet, Rfl: 3 .  cetirizine (ZYRTEC) 10 MG tablet, Take 10 mg by mouth at bedtime., Disp: , Rfl:  .  Cholecalciferol (VITAMIN D) 125 MCG (5000 UT) CAPS, Take 5,000 Units by mouth daily. , Disp: , Rfl:  .  escitalopram (LEXAPRO) 20 MG tablet, Take 1 tablet Daily for Mood, Disp: 90 tablet, Rfl: 3 .  Eszopiclone 3 MG TABS, Take immediately before bedtime, Disp: 30 tablet, Rfl: 3 .  fexofenadine (ALLEGRA) 180 MG tablet, Take 180 mg by mouth daily as needed for allergies or rhinitis. Alternates with zyrtec when Zyrtec seems to not be as effective, Disp: , Rfl:  .  fluocinolone (SYNALAR) 0.025 % ointment, APPLY TO AFFECTED AREA TWICE A DAY, Disp: 30 g, Rfl: 0 .  fluticasone (FLONASE) 50 MCG/ACT nasal spray, Place 2 sprays into both nostrils daily., Disp: 16 g, Rfl: 2 .  MELATONIN PO, Take 10 mg by mouth., Disp: , Rfl:  .  meloxicam (MOBIC) 15 MG tablet, Take 1/2 to 1 tablet Daily for Pain & Inflammation  (Limit to 4-5 tabs /week Tto Avoid  Kidney Damage), Disp: 90 tablet, Rfl: 3 .  montelukast (SINGULAIR) 10 MG tablet, Take 1 tablet Daily for Allergies, Disp: 90 tablet, Rfl: 0 .  Multiple Vitamin (MULTIVITAMIN) tablet, Take 1 tablet by mouth daily., Disp: , Rfl:  .  nystatin cream (MYCOSTATIN), Apply 1 application topically 2 (two) times daily. (Patient taking differently: Apply 1 application topically 2 (two) times daily as needed for dry skin. ), Disp: 30 g, Rfl: 1 .  omeprazole (PRILOSEC) 40 MG capsule, TAKE 1 CAPSULE BY MOUTH EVERY DAY, Disp: 90 capsule, Rfl: 3 .  rosuvastatin (CRESTOR) 5 MG tablet, TAKE 1 TABLET BY MOUTH EVERYDAY AT BEDTIME, Disp: 90 tablet, Rfl: 3 .  telmisartan (MICARDIS) 40 MG tablet, TAKE 1 TABLET DAILY FOR BLOOD PRESSURE, Disp: 90 tablet, Rfl: 3 .  triamcinolone ointment (KENALOG) 0.1 %, Apply 1 application topically 2 (two) times daily. (Patient taking differently: Apply 1 application topically daily as needed (rash). ), Disp: 80 g, Rfl: 1 .  vitamin C (ASCORBIC ACID) 500 MG tablet, Take 500 mg by mouth daily., Disp: , Rfl:  .  vitamin E 600 UNIT capsule, Take 600 Units by mouth daily., Disp: , Rfl:   Past Medical History: Past Medical History:  Diagnosis Date  . Anxiety   . Arthritis   . Depression    no meds  . Diaphragmatic  hernia with obstruction, without gangrene 11/07/2018  . Dyspnea    can happen anytime  . Family history of breast cancer   . Family history of colon cancer   . Family history of lung cancer   . Family history of pancreatic cancer   . Family history of prostate cancer   . GERD (gastroesophageal reflux disease)   . Hernia, diaphragmatic   . History of kidney stones   . Hyperlipidemia   . Hypertension    no meds  . Pneumonia    1 time    Tobacco Use: Social History   Tobacco Use  Smoking Status Never Smoker  Smokeless Tobacco Never Used    Labs: Recent Review Flowsheet Data    Labs for ITP Cardiac and Pulmonary Rehab Latest Ref Rng & Units 03/24/2018 09/11/2018  12/12/2018 03/18/2019 06/17/2019   Cholestrol <200 mg/dL - 226(H) 170 174 168   LDLCALC mg/dL (calc) - 132(H) 78 82 79   HDL > OR = 50 mg/dL - 62 63 69 65   Trlycerides <150 mg/dL - 188(H) 194(H) 136 143   Hemoglobin A1c <5.7 % of total Hgb - 5.6 5.4 5.6 5.5   PHART 7.35 - 7.45 7.361 - - - -   PCO2ART 32 - 48 mmHg 45.9 - - - -   HCO3 20.0 - 28.0 mmol/L 25.4 - - - -   ACIDBASEDEF 0.0 - 2.0 mmol/L - - - - -   O2SAT % 96.7 - - - -       Pulmonary Assessment Scores:  Pulmonary Assessment Scores    Row Name 04/09/19 1513         ADL UCSD   SOB Score total 62     Rest 2     Walk 4     Stairs 5     Bath 2     Dress 2     Shop 2       CAT Score   CAT Score 25       mMRC Score   mMRC Score 2            UCSD: Self-administered rating of dyspnea associated with activities of daily living (ADLs) 6-point scale (0 = "not at all" to 5 = "maximal or unable to do because of breathlessness")  Scoring Scores range from 0 to 120.  Minimally important difference is 5 units  CAT: CAT can identify the health impairment of COPD patients and is better correlated with disease progression.  CAT has a scoring range of zero to 40. The CAT score is classified into four groups of low (less than 10), medium (10 - 20), high (21-30) and very high (31-40) based on the impact level of disease on health status. A CAT score over 10 suggests significant symptoms.  A worsening CAT score could be explained by an exacerbation, poor medication adherence, poor inhaler technique, or progression of COPD or comorbid conditions.  CAT MCID is 2 points  mMRC: mMRC (Modified Medical Research Council) Dyspnea Scale is used to assess the degree of baseline functional disability in patients of respiratory disease due to dyspnea. No minimal important difference is established. A decrease in score of 1 point or greater is considered a positive change.   Pulmonary Function Assessment:   Exercise Target Goals: Exercise  Program Goal: Individual exercise prescription set using results from initial 6 min walk test and THRR while considering  patient's activity barriers and safety.   Exercise Prescription Goal: Initial exercise  prescription builds to 30-45 minutes a day of aerobic activity, 2-3 days per week.  Home exercise guidelines will be given to patient during program as part of exercise prescription that the participant will acknowledge.  Education: Aerobic Exercise & Resistance Training: - Gives group verbal and written instruction on the various components of exercise. Focuses on aerobic and resistive training programs and the benefits of this training and how to safely progress through these programs..   Education: Exercise & Equipment Safety: - Individual verbal instruction and demonstration of equipment use and safety with use of the equipment.   Pulmonary Rehab from 10/11/2018 in [facility] Cardiac and Pulmonary Rehab  Date 10/11/18  Educator AS  Instruction Review Code 1- Verbalizes Understanding      Education: Exercise Physiology & General Exercise Guidelines: - Group verbal and written instruction with models to review the exercise physiology of the cardiovascular system and associated critical values. Provides general exercise guidelines with specific guidelines to those with heart or lung disease.    Education: Flexibility, Balance, Mind/Body Relaxation: Provides group verbal/written instruction on the benefits of flexibility and balance training, including mind/body exercise modes such as yoga, pilates and tai chi.  Demonstration and skill practice provided.   Activity Barriers & Risk Stratification:  Activity Barriers & Cardiac Risk Stratification - 04/03/19 1218      Activity Barriers & Cardiac Risk Stratification   Activity Barriers Deconditioning;Muscular Weakness;Shortness of Breath;History of Falls           6 Minute Walk:  6 Minute Walk    Row Name 04/09/19 1504         6  Minute Walk   Phase Initial     Distance 1200 feet     Walk Time 6 minutes     # of Rest Breaks 0     MPH 2.27     METS 3.37     RPE 17     Perceived Dyspnea  3     VO2 Peak 11.81     Symptoms No     Resting HR 79 bpm     Resting BP 136/84     Resting Oxygen Saturation  97 %     Exercise Oxygen Saturation  during 6 min walk 94 %     Max Ex. HR 105 bpm     Max Ex. BP 164/82     2 Minute Post BP 138/86       Interval HR   1 Minute HR 92     2 Minute HR 100     3 Minute HR 103     4 Minute HR 105     5 Minute HR 104     6 Minute HR 103     2 Minute Post HR 86     Interval Heart Rate? Yes       Interval Oxygen   Interval Oxygen? Yes     Baseline Oxygen Saturation % 97 %     1 Minute Oxygen Saturation % 98 %     1 Minute Liters of Oxygen 0 L     2 Minute Oxygen Saturation % 94 %     2 Minute Liters of Oxygen 0 L     3 Minute Oxygen Saturation % 95 %     3 Minute Liters of Oxygen 0 L     4 Minute Oxygen Saturation % 95 %     4 Minute Liters of Oxygen 0 L  5 Minute Oxygen Saturation % 95 %     5 Minute Liters of Oxygen 0 L     6 Minute Oxygen Saturation % 96 %     6 Minute Liters of Oxygen 0 L     2 Minute Post Oxygen Saturation % 99 %     2 Minute Post Liters of Oxygen 0 L           Oxygen Initial Assessment:  Oxygen Initial Assessment - 04/03/19 1221      Home Oxygen   Home Oxygen Device None    Sleep Oxygen Prescription CPAP    Home Exercise Oxygen Prescription None    Home at Rest Exercise Oxygen Prescription None    Compliance with Home Oxygen Use Yes      Initial 6 min Walk   Oxygen Used None      Program Oxygen Prescription   Program Oxygen Prescription None      Intervention   Short Term Goals To learn and exhibit compliance with exercise, home and travel O2 prescription;To learn and understand importance of monitoring SPO2 with pulse oximeter and demonstrate accurate use of the pulse oximeter.;To learn and understand importance of maintaining  oxygen saturations>88%;To learn and demonstrate proper pursed lip breathing techniques or other breathing techniques.;To learn and demonstrate proper use of respiratory medications    Long  Term Goals Exhibits compliance with exercise, home and travel O2 prescription;Verbalizes importance of monitoring SPO2 with pulse oximeter and return demonstration;Maintenance of O2 saturations>88%;Exhibits proper breathing techniques, such as pursed lip breathing or other method taught during program session;Compliance with respiratory medication;Demonstrates proper use of MDI's           Oxygen Re-Evaluation:  Oxygen Re-Evaluation    Row Name 04/10/19 1107 05/24/19 1116 05/27/19 1019 07/15/19 1720 07/22/19 1734     Program Oxygen Prescription   Program Oxygen Prescription _0      Home Oxygen   Home Oxygen Device None None None Home Concentrator Home Concentrator   Sleep Oxygen Prescription CPAP CPAP CPAP Continuous;CPAP Continuous;CPAP   Liters per minute -- 6 0 2 2   Home Exercise Oxygen Prescription _1    Home at Rest Exercise Oxygen Prescription _2    Compliance with Home Oxygen Use _3      Goals/Expected Outcomes   Short Term Goals To learn and exhibit compliance with exercise, home and travel O2 prescription;To learn and understand importance of monitoring SPO2 with pulse oximeter and demonstrate accurate use of the pulse oximeter.;To learn and understand importance of maintaining oxygen saturations>88%;To learn and demonstrate proper pursed lip breathing techniques or other breathing techniques.;To learn and demonstrate proper use of respiratory medications To learn and exhibit compliance with exercise, home and travel O2 prescription;To learn and understand importance of monitoring SPO2 with pulse oximeter and demonstrate accurate use of the pulse oximeter.;To learn and understand importance of maintaining oxygen  saturations>88%;To learn and demonstrate proper pursed lip breathing techniques or other breathing techniques.;To learn and demonstrate proper use of respiratory medications To learn and exhibit compliance with exercise, home and travel O2 prescription;To learn and demonstrate proper pursed lip breathing techniques or other breathing techniques.;To learn and demonstrate proper use of respiratory medications;To learn and understand importance of monitoring SPO2 with pulse oximeter and demonstrate accurate use of the pulse oximeter. To learn and demonstrate proper use of respiratory medications;To learn and understand importance of maintaining oxygen saturations>88%;To learn and understand importance  of monitoring SPO2 with pulse oximeter and demonstrate accurate use of the pulse oximeter.;To learn and exhibit compliance with exercise, home and travel O2 prescription;To learn and demonstrate proper pursed lip breathing techniques or other breathing techniques. To learn and demonstrate proper use of respiratory medications;To learn and understand importance of maintaining oxygen saturations>88%;To learn and understand importance of monitoring SPO2 with pulse oximeter and demonstrate accurate use of the pulse oximeter.;To learn and exhibit compliance with exercise, home and travel O2 prescription;To learn and demonstrate proper pursed lip breathing techniques or other breathing techniques.   Long  Term Goals Exhibits compliance with exercise, home and travel O2 prescription;Verbalizes importance of monitoring SPO2 with pulse oximeter and return demonstration;Maintenance of O2 saturations>88%;Exhibits proper breathing techniques, such as pursed lip breathing or other method taught during program session;Compliance with respiratory medication;Demonstrates proper use of MDI's Exhibits compliance with exercise, home and travel O2 prescription;Verbalizes importance of monitoring SPO2 with pulse oximeter and return  demonstration;Maintenance of O2 saturations>88%;Exhibits proper breathing techniques, such as pursed lip breathing or other method taught during program session;Compliance with respiratory medication;Demonstrates proper use of MDI's Exhibits compliance with exercise, home and travel O2 prescription;Verbalizes importance of monitoring SPO2 with pulse oximeter and return demonstration;Exhibits proper breathing techniques, such as pursed lip breathing or other method taught during program session;Compliance with respiratory medication;Demonstrates proper use of MDI's Compliance with respiratory medication;Demonstrates proper use of MDI's;Exhibits proper breathing techniques, such as pursed lip breathing or other method taught during program session;Maintenance of O2 saturations>88%;Verbalizes importance of monitoring SPO2 with pulse oximeter and return demonstration;Exhibits compliance with exercise, home and travel O2 prescription Compliance with respiratory medication;Demonstrates proper use of MDI's;Exhibits proper breathing techniques, such as pursed lip breathing or other method taught during program session;Maintenance of O2 saturations>88%;Verbalizes importance of monitoring SPO2 with pulse oximeter and return demonstration;Exhibits compliance with exercise, home and travel O2 prescription   Comments Reviewed PLB technique with pt.  Talked about how it works and it's importance in maintaining their exercise saturations. Talking to doctor about BIPAP or diphibulator machine. Reviewed PLB and diaphramic breathing. She does have a pulse oximeter to check her oxygen saturation at home. Explained why it is important to have one. Reviewed that oxygen saturations should be 88 percent and above. Patient has her smart watch to check oxygen and heart rate. [name] is back from [place] and is now going to be on home oxygen at night, She wears her CPAP routinely and is going to bleed in oxygen into her CPAP machine.  [name] is waiting for new machine for the oxygen as the other didnt work.   Goals/Expected Outcomes Short: Become more profiecient at using PLB.   Long: Become independent at using PLB. Short: Become more profiecient at using PLB.   Long: Become independent at using PLB. Short: monitor oxygen at home with exertion. Long: maintain oxygen saturations above 88 percent independently. Short: Use CPAP machine with oxygen. Long: maintain oxygen device and CPAP o2 delivery independently. Short: Use CPAP machine with oxygen. Long: maintain oxygen device and CPAP o2 delivery independently.          Oxygen Discharge (Final Oxygen Re-Evaluation):  Oxygen Re-Evaluation - 07/22/19 1734      Program Oxygen Prescription   Program Oxygen Prescription None      Home Oxygen   Home Oxygen Device Home Concentrator    Sleep Oxygen Prescription Continuous;CPAP    Liters per minute 2    Home Exercise Oxygen Prescription None    Home at Rest Exercise Oxygen  Prescription None    Compliance with Home Oxygen Use Yes      Goals/Expected Outcomes   Short Term Goals To learn and demonstrate proper use of respiratory medications;To learn and understand importance of maintaining oxygen saturations>88%;To learn and understand importance of monitoring SPO2 with pulse oximeter and demonstrate accurate use of the pulse oximeter.;To learn and exhibit compliance with exercise, home and travel O2 prescription;To learn and demonstrate proper pursed lip breathing techniques or other breathing techniques.    Long  Term Goals Compliance with respiratory medication;Demonstrates proper use of MDI's;Exhibits proper breathing techniques, such as pursed lip breathing or other method taught during program session;Maintenance of O2 saturations>88%;Verbalizes importance of monitoring SPO2 with pulse oximeter and return demonstration;Exhibits compliance with exercise, home and travel O2 prescription    Comments [name] is waiting for new  machine for the oxygen as the other didnt work.    Goals/Expected Outcomes Short: Use CPAP machine with oxygen. Long: maintain oxygen device and CPAP o2 delivery independently.           Initial Exercise Prescription:  Initial Exercise Prescription - 04/09/19 1500      Treadmill   MPH 2.2    Grade 1.5    Minutes 15    METs 3.1      Recumbant Bike   Level 2    RPM 50    Watts 45    Minutes 15    METs 3.1      Arm Ergometer   Level 1    RPM 30    Minutes 15    METs 2.5      Elliptical   Level 1    Speed 3    Minutes 15      Intensity   THRR 40-80% of Max Heartrate 116-153    Ratings of Perceived Exertion 11-15    Perceived Dyspnea 0-4      Resistance Training   Training Prescription Yes    Weight 3 lb    Reps 10-15           Perform Capillary Blood Glucose checks as needed.  Exercise Prescription Changes:  Exercise Prescription Changes    Row Name 04/11/19 1200 05/07/19 1500 05/22/19 1100 06/03/19 1400 07/02/19 1500     Response to Exercise   Blood Pressure (Admit) 130/78 136/78 124/82 118/78 106/76   Blood Pressure (Exercise) 170/82 152/82 162/80 180/98 150/70   Blood Pressure (Exit) 116/84 132/70 126/74 132/78 122/72   Heart Rate (Admit) 72 bpm 86 bpm 83 bpm 94 bpm 88 bpm   Heart Rate (Exercise) 101 bpm 120 bpm 122 bpm 116 bpm 144 bpm   Heart Rate (Exit) 95 bpm 98 bpm 100 bpm 104 bpm 90 bpm   Oxygen Saturation (Admit) 98 % 98 % 95 % 97 % 98 %   Oxygen Saturation (Exercise) 95 % 94 % 94 % 95 % 94 %   Oxygen Saturation (Exit) 96 % 98 % 96 % 94 % 96 %   Rating of Perceived Exertion (Exercise) _0 Perceived Dyspnea (Exercise) _1 Symptoms _2    Duration Continue with 30 min of aerobic exercise without signs/symptoms of physical distress. Continue with 30 min of aerobic exercise without signs/symptoms of physical distress. Continue with 30 min of aerobic exercise without signs/symptoms of physical distress.  Continue with 30 min of aerobic exercise without signs/symptoms of physical distress. Continue with 30 min of aerobic exercise  without signs/symptoms of physical distress.   Intensity _0      Progression   Progression -- Continue to progress workloads to maintain intensity without signs/symptoms of physical distress. Continue to progress workloads to maintain intensity without signs/symptoms of physical distress. Continue to progress workloads to maintain intensity without signs/symptoms of physical distress. Continue to progress workloads to maintain intensity without signs/symptoms of physical distress.   Average METs -- 2.8 3.07 3.4 3.5     Resistance Training   Training Prescription _1    Weight 3 lb 3 lb 3 lb 3 lb 3 lb   Reps 10-15 10-15 10-15 10-15 10-15     Interval Training   Interval Training _2      Treadmill   MPH -- -- -- -- 2.9   Grade -- -- -- -- 2   Minutes -- -- -- -- 15   METs -- -- -- -- 4     Recumbant Bike   Level _3 --   RPM 50 50 -- 50 --   Watts 15 -- 30 36 --   Minutes _4 --   METs 2.47 3.39 3.31 3 --     Arm Ergometer   Level _5 --   RPM 30 30 -- 30 --   Minutes _6 --   METs 2.8 2.2 3.2 3.4 --     Elliptical   Level -- -- -- -- 1   Minutes -- -- -- -- 15   Row Name 07/17/19 1400 07/31/19 1000 08/12/19 1300         Response to Exercise   Blood Pressure (Admit) 132/76 124/80 140/82     Blood Pressure (Exercise) 188/80 188/76 144/78     Blood Pressure (Exit) 130/72 132/78 124/84     Heart Rate (Admit) 98 bpm 86 bpm 92 bpm     Heart Rate (Exercise) 121 bpm 123 bpm 122 bpm     Heart Rate (Exit) 96 bpm 99 bpm 102 bpm     Oxygen Saturation (Admit) 96 % 100 % 98 %     Oxygen Saturation (Exercise) 94 % 94 % 97 %     Oxygen Saturation (Exit) 97 % 94 % 96 %     Rating of Perceived Exertion (Exercise) _7 Perceived  Dyspnea (Exercise) _8 Symptoms none none none     Duration Continue with 30 min of aerobic exercise without signs/symptoms of physical distress. Continue with 30 min of aerobic exercise without signs/symptoms of physical distress. Continue with 30 min of aerobic exercise without signs/symptoms of physical distress.     Intensity THRR unchanged THRR unchanged THRR unchanged       Progression   Progression Continue to progress workloads to maintain intensity without signs/symptoms of physical distress. Continue to progress workloads to maintain intensity without signs/symptoms of physical distress. Continue to progress workloads to maintain intensity without signs/symptoms of physical distress.     Average METs 3.22 3.45 3.26       Resistance Training   Training Prescription Yes Yes Yes     Weight 3 lb 5 lb 5 lb     Reps 10-15 10-15 10-15       Interval Training   Interval Training No No No       Recumbant Bike  Level _0 RPM -- 60 --     Watts 32 -- 45     Minutes _1 METs 2.43 3.4 4.02       Arm Ergometer   Level _2 RPM -- 30 --     Minutes _3 METs 3.5 3.5 3.1       Home Exercise Plan   Plans to continue exercise at Home (comment)  walk Home (comment)  walk Home (comment)  walk     Frequency Add 2 additional days to program exercise sessions. Add 2 additional days to program exercise sessions. Add 2 additional days to program exercise sessions.     Initial Home Exercises Provided 05/13/19 05/13/19 05/13/19            Exercise Comments:   Exercise Goals and Review:  Exercise Goals    Row Name 04/09/19 1519             Exercise Goals   Increase Physical Activity Yes       Intervention Provide advice, education, support and counseling about physical activity/exercise needs.;Develop an individualized exercise prescription for aerobic and resistive training based on initial evaluation findings, risk stratification, comorbidities  and participant's personal goals.       Expected Outcomes Short Term: Attend rehab on a regular basis to increase amount of physical activity.;Long Term: Add in home exercise to make exercise part of routine and to increase amount of physical activity.;Long Term: Exercising regularly at least 3-5 days a week.       Increase Strength and Stamina Yes       Intervention Provide advice, education, support and counseling about physical activity/exercise needs.;Develop an individualized exercise prescription for aerobic and resistive training based on initial evaluation findings, risk stratification, comorbidities and participant's personal goals.       Expected Outcomes Short Term: Increase workloads from initial exercise prescription for resistance, speed, and METs.;Short Term: Perform resistance training exercises routinely during rehab and add in resistance training at home;Long Term: Improve cardiorespiratory fitness, muscular endurance and strength as measured by increased METs and functional capacity (6MWT)       Able to understand and use rate of perceived exertion (RPE) scale Yes       Intervention Provide education and explanation on how to use RPE scale       Expected Outcomes Short Term: Able to use RPE daily in rehab to express subjective intensity level;Long Term:  Able to use RPE to guide intensity level when exercising independently       Able to understand and use Dyspnea scale Yes       Intervention Provide education and explanation on how to use Dyspnea scale       Expected Outcomes Short Term: Able to use Dyspnea scale daily in rehab to express subjective sense of shortness of breath during exertion;Long Term: Able to use Dyspnea scale to guide intensity level when exercising independently       Knowledge and understanding of Target Heart Rate Range (THRR) Yes       Intervention Provide education and explanation of THRR including how the numbers were predicted and where they are located for  reference       Expected Outcomes Short Term: Able to state/look up THRR;Short Term: Able to use daily as guideline for intensity in rehab;Long Term: Able to use THRR to govern intensity  when exercising independently       Able to check pulse independently Yes       Intervention Provide education and demonstration on how to check pulse in carotid and radial arteries.;Review the importance of being able to check your own pulse for safety during independent exercise       Expected Outcomes Short Term: Able to explain why pulse checking is important during independent exercise;Long Term: Able to check pulse independently and accurately       Understanding of Exercise Prescription Yes       Intervention Provide education, explanation, and written materials on patient's individual exercise prescription       Expected Outcomes Short Term: Able to explain program exercise prescription;Long Term: Able to explain home exercise prescription to exercise independently              Exercise Goals Re-Evaluation :  Exercise Goals Re-Evaluation    Row Name 04/10/19 1105 04/11/19 1300 04/24/19 0933 05/07/19 1540 05/13/19 1011     Exercise Goal Re-Evaluation   Exercise Goals Review Increase Physical Activity;Increase Strength and Stamina;Understanding of Exercise Prescription;Able to understand and use Dyspnea scale;Able to understand and use rate of perceived exertion (RPE) scale;Knowledge and understanding of Target Heart Rate Range (THRR) Increase Physical Activity;Increase Strength and Stamina;Able to understand and use rate of perceived exertion (RPE) scale;Able to understand and use Dyspnea scale;Knowledge and understanding of Target Heart Rate Range (THRR);Able to check pulse independently -- Increase Physical Activity;Increase Strength and Stamina;Able to understand and use rate of perceived exertion (RPE) scale;Able to understand and use Dyspnea scale;Knowledge and understanding of Target Heart Rate Range  (THRR);Able to check pulse independently;Understanding of Exercise Prescription Increase Physical Activity;Increase Strength and Stamina;Able to understand and use rate of perceived exertion (RPE) scale;Able to understand and use Dyspnea scale;Knowledge and understanding of Target Heart Rate Range (THRR);Able to check pulse independently;Understanding of Exercise Prescription   Comments Reviewed RPE scale, THR and program prescription with pt today.  Pt voiced understanding and was given a copy of goals to take home. -- Out sick since last review. [place] is just returning after being ill. Wanza is using her 5 lb weights at home.  This EP will give info on [employer]   Expected Outcomes Short: Use RPE daily to regulate intensity. Long: Follow program prescription in THR. -- -- Short : build back up after being ill Long: build overall stamina Short: consider walking at the mall or water aerobics  Long:  exercise 3-5 days per week   Row Name 05/22/19 1008 05/24/19 1113 05/27/19 1016 06/03/19 1427 06/17/19 1438     Exercise Goal Re-Evaluation   Exercise Goals Review Increase Physical Activity;Increase Strength and Stamina;Understanding of Exercise Prescription Increase Physical Activity;Increase Strength and Stamina;Understanding of Exercise Prescription Increase Physical Activity;Increase Strength and Stamina;Understanding of Exercise Prescription Increase Physical Activity;Increase Strength and Stamina;Able to understand and use rate of perceived exertion (RPE) scale;Able to understand and use Dyspnea scale;Knowledge and understanding of Target Heart Rate Range (THRR);Able to check pulse independently;Understanding of Exercise Prescription --   Comments [name] is doing well in rehab.  She is excited to be back and enjoys getting in her exericse.  She is doing 2.8 mph on treadmill.  We will continue to monitor her progress. [name] is doing well in rehab.  She is excited to be back and enjoys getting  in her exericse.  5 lb weights at home 1x/week 10 minutes. Walking at home for 30 minutes 2x/week trail with hills:  breathing gets difficult, not able to talk during hills, will stop to catch breath. She is not doing well with exercise at home and would like to start going to [employer] again. Her doctor states that she does not want her to go due to covid. She is due for her second vaccine soon and will ask her doctor if she will be able to go after her vaccince. [name] is up to level 5 on the XR.  Staff will encourage increasing weight strength training. Out since last review. Last attended on 4/30   Expected Outcomes Short: Increase workload on XR  Long: Continue to improve stamina. Short: working towards 1 hour for walking at home  Long: Continue to improve stamina. Short: ask doctor if she can go back to the gym after her vaccine. Long: Start going to the gym 2 days a week. Short: increase to 4-5 lb for strength work Long: improve overall MET level and stamina --   Row Name 07/02/19 1534 07/15/19 1742 07/17/19 1436 07/22/19 1726 07/31/19 1016     Exercise Goal Re-Evaluation   Exercise Goals Review Increase Physical Activity;Increase Strength and Stamina;Able to understand and use rate of perceived exertion (RPE) scale;Knowledge and understanding of Target Heart Rate Range (THRR) Increase Physical Activity;Increase Strength and Stamina Increase Physical Activity;Increase Strength and Stamina;Understanding of Exercise Prescription Increase Physical Activity;Increase Strength and Stamina;Understanding of Exercise Prescription Increase Physical Activity;Increase Strength and Stamina;Understanding of Exercise Prescription   Comments [name] has switched to a different time slot that works better for her.  She has tried the ellpitical an dit si challenging for her. Staff will monitor progress. Patient is going to start going to [employer] for exercise. It will help her keep her strength up and  improve her mood. [name] returned this week after being out for almost a month in [place] with family. She was able to return without a problem and picked right back up. WE will continue to monitor her progress. [name] has a sore place around rib cage from movogn oxygen tanks.  She is doing seated machines and will just stretch instead of doing weights today. [name] is progressing well with exercise and seeing small but steady gains in MET level.  Staff will monitor progress.   Expected Outcomes Short: get back to consistent attendance  Long:  increase stamina Short: start going to [employer]. Long: maintain a workout routine post [facility]. Short: Return to regular attendance Long: Continue to improve stamina. Short: Return to regular attendance Long: Continue to improve stamina. Short: exrecise at least 3 days per week Long: increase MET level   Row Name 08/12/19 1329             Exercise Goal Re-Evaluation   Exercise Goals Review Increase Physical Activity;Increase Strength and Stamina;Understanding of Exercise Prescription       Comments [name] has only attended once since last review.  She would benefit from improved attendance.  We will continue to monitor her progress.       Expected Outcomes Short: Improved attendance Long: Continue to improve stamina.              Discharge Exercise Prescription (Final Exercise Prescription Changes):  Exercise Prescription Changes - 08/12/19 1300      Response to Exercise   Blood Pressure (Admit) 140/82    Blood Pressure (Exercise) 144/78    Blood Pressure (Exit) 124/84    Heart Rate (Admit) 92 bpm    Heart Rate (Exercise) 122 bpm  Heart Rate (Exit) 102 bpm    Oxygen Saturation (Admit) 98 %    Oxygen Saturation (Exercise) 97 %    Oxygen Saturation (Exit) 96 %    Rating of Perceived Exertion (Exercise) 14    Perceived Dyspnea (Exercise) 3    Symptoms none    Duration Continue with 30 min of aerobic exercise without signs/symptoms  of physical distress.    Intensity THRR unchanged      Progression   Progression Continue to progress workloads to maintain intensity without signs/symptoms of physical distress.    Average METs 3.26      Resistance Training   Training Prescription Yes    Weight 5 lb    Reps 10-15      Interval Training   Interval Training No      Recumbant Bike   Level 3    Watts 45    Minutes 15    METs 4.02      Arm Ergometer   Level 1    Minutes 15    METs 3.1      Home Exercise Plan   Plans to continue exercise at Home (comment)   walk   Frequency Add 2 additional days to program exercise sessions.    Initial Home Exercises Provided 05/13/19           Nutrition:  Target Goals: Understanding of nutrition guidelines, daily intake of sodium <1580m, cholesterol <20105m calories 30% from fat and 7% or less from saturated fats, daily to have 5 or more servings of fruits and vegetables.  Education: Controlling Sodium/Reading Food Labels -Group verbal and written material supporting the discussion of sodium use in heart healthy nutrition. Review and explanation with models, verbal and written materials for utilization of the food label.   Education: General Nutrition Guidelines/Fats and Fiber: -Group instruction provided by verbal, written material, models and posters to present the general guidelines for heart healthy nutrition. Gives an explanation and review of dietary fats and fiber.   Biometrics:    Nutrition Therapy Plan and Nutrition Goals:   Nutrition Assessments:   MEDIFICTS Score Key:          ?70 Need to make dietary changes          40-70 Heart Healthy Diet         ? 40 Therapeutic Level Cholesterol Diet  Nutrition Goals Re-Evaluation:  Nutrition Goals Re-Evaluation    [place] 06/24/19 1726 07/22/19 1727           Goals   Nutrition Goal ST: Pt wants to try hello fresh, stress and sleep management, exercise nutrition (pre/post workout meals/snacks) LT:  Weight loss 50lb (5-10lbs now) MiSharyn Lulls still working on goals set by RD.  She was able to keep up healthy eating while out of town.      Comment Pt discouraged that she is not losing weight. Discussed weight cycling, set point weight, health vs weight, role of stress and sleep in weight, role of lean muscle tissue and exercise MNT, empty calories such as sugar sweetened beverages. --      Expected Outcome ST: Pt wants to try hello fresh, stress and sleep management, exercise nutrition (pre/post workout meals/snacks) LT: Weight loss 50lb (5-10lbs now) Short:  continue working on actions suggested by RD Long: achieve goal weight             Nutrition Goals Discharge (Final Nutrition Goals Re-Evaluation):  Nutrition Goals Re-Evaluation - 07/22/19 1727  Goals   Nutrition Goal [name] is still working on goals set by RD.  She was able to keep up healthy eating while out of town.    Expected Outcome Short:  continue working on actions suggested by RD Long: achieve goal weight           Psychosocial: Target Goals: Acknowledge presence or absence of significant depression and/or stress, maximize coping skills, provide positive support system. Participant is able to verbalize types and ability to use techniques and skills needed for reducing stress and depression.   Education: Depression - Provides group verbal and written instruction on the correlation between heart/lung disease and depressed mood, treatment options, and the stigmas associated with seeking treatment.   Education: Sleep Hygiene -Provides group verbal and written instruction about how sleep can affect your health.  Define sleep hygiene, discuss sleep cycles and impact of sleep habits. Review good sleep hygiene tips.    Education: Stress and Anxiety: - Provides group verbal and written instruction about the health risks of elevated stress and causes of high stress.  Discuss the correlation between heart/lung disease and  anxiety and treatment options. Review healthy ways to manage with stress and anxiety.   Initial Review & Psychosocial Screening:  Initial Psych Review & Screening - 04/03/19 1219      Initial Review   Current issues with Current Psychotropic Meds;Current Sleep Concerns;Current Depression;Current Stress Concerns    Source of Stress Concerns Chronic Illness;Family;Poor Coping Skills;Unable to participate in former interests or hobbies;Unable to perform yard/household activities    Comments She stresses about many things.  She has been caring for her aunt over the past year.  She has lost several family members to suicide and murder. She wants to breathe better to be able to do more at home.      Family Dynamics   Good Support System? Yes   Husband, aunt, son   Comments Recent loss of family and friends (4 in the last two months)      Barriers   Psychosocial barriers to participate in program The patient should benefit from training in stress management and relaxation.;Psychosocial barriers identified (see note)      Screening Interventions   Interventions Encouraged to exercise;To provide support and resources with identified psychosocial needs;Provide feedback about the scores to participant    Expected Outcomes Short Term goal: Utilizing psychosocial counselor, staff and physician to assist with identification of specific Stressors or current issues interfering with healing process. Setting desired goal for each stressor or current issue identified.;Long Term Goal: Stressors or current issues are controlled or eliminated.;Short Term goal: Identification and review with participant of any Quality of Life or Depression concerns found by scoring the questionnaire.;Long Term goal: The participant improves quality of Life and PHQ9 Scores as seen by post scores and/or verbalization of changes           Quality of Life Scores:  Scores of 19 and below usually indicate a poorer quality of life in  these areas.  A difference of  2-3 points is a clinically meaningful difference.  A difference of 2-3 points in the total score of the Quality of Life Index has been associated with significant improvement in overall quality of life, self-image, physical symptoms, and general health in studies assessing change in quality of life.  PHQ-9: Recent Review Flowsheet Data    Depression screen [facility] 2/9 07/11/2019 05/24/2019 04/09/2019 10/29/2018   Decreased Interest _0 Down, Depressed, Hopeless 3  _0 PHQ - 2 Score _1 Altered sleeping _2 Tired, decreased energy _3 Change in appetite _4 Feeling bad or failure about yourself  _5 Trouble concentrating _6 Moving slowly or fidgety/restless 0 0 1 2   Suicidal thoughts 0 0 0 0   PHQ-9 Score _7 Difficult doing work/chores - Extremely dIfficult - Very difficult     Interpretation of Total Score  Total Score Depression Severity:  1-4 = Minimal depression, 5-9 = Mild depression, 10-14 = Moderate depression, 15-19 = Moderately severe depression, 20-27 = Severe depression   Psychosocial Evaluation and Intervention:  Psychosocial Evaluation - 04/03/19 1226      Psychosocial Evaluation & Interventions   Interventions Stress management education;Encouraged to exercise with the program and follow exercise prescription    Comments Selinda Eon is coming back to rehab after taking time off to help care for her aunt over the past year.  Seh has had a rough few months after losing family to suicide and murder.  She has a significant history of depression and anxiety but feels that her meds are helping to keep it manageable for her.  She really is looking forward to returning to rehab again! So much so that she turned up in person for her phone call today!  She wants to work on her breathing and lose the weight she has gained over the last few months.    Expected Outcomes Short: Attend rehab regularly and use  exerise as a stress outlet.  Long: Work on weight loss and breathing to be more functional.    Continue Psychosocial Services  Follow up required by staff           Psychosocial Re-Evaluation:  Psychosocial Re-Evaluation    [place] Name 05/13/19 1023 05/13/19 1026 05/24/19 1031 07/15/19 1723 07/22/19 1722     Psychosocial Re-Evaluation   Current issues with Current Sleep Concerns;Current Stress Concerns -- Current Sleep Concerns;Current Stress Concerns;Current Anxiety/Panic;Current Psychotropic Meds Current Sleep Concerns;Current Depression;History of Depression;Current Psychotropic Meds --   Comments [name] sees her Dr today - she still hasnt been sleeping well even using melatonin and CPAP.  They may switch to Bipap [name] is calling her Dr today neurologist) - she still hasnt been sleeping well even using melatonin and CPAP.  They may switch to Bipap Pt is on wellbutrin and lexapro to help with anxiety, but reports that it has not been helping her; pt has be on this medication for 1 year. Pt reports not seeing therapist for years. Encouraged pt to tlak to MD about medications management as well as looking into therpay. Suggested [facility] therapy as they give financial aid and flexible schedules and therapy. Encouraged pt to practice good sleep hygiene techniques as well as some basic cognitive behavioral therapy tools. Pt reports sleep apnea is affecting her sleep. Patient is going to start wearing her CPAP with 2 liters of oxygen bled in. She is hoping that her oxygen bleed in would help her sleep better and help her overall health. Her neurologist wants her to see a psychologist.She is waiting on her insurance to see if she can start talking to a mental health counselor [name] is waiting for a new machine for the oxygen and it should be in this week.  Other didnt work.  She  is waiting on authorization to see a counselor.   Expected Outcomes Short: follow up with Dr Laverta Baltimore: improve sleep quality  -- Short: follow up with MD and look into therapy options.  Long: improve sleep quality, reduce anxiety and stress Short: start talking to a mental health counselor. Long: maintain mental health and overall stress levels. Short: follow up to see a counselor Long:  maintain mental health and positive attitude   Interventions -- -- Stress management education;Encouraged to attend Pulmonary Rehabilitation for the exercise;Relaxation education Encouraged to attend Pulmonary Rehabilitation for the exercise --   Continue Psychosocial Services  -- -- Follow up required by staff Follow up required by staff --     Initial Review   Source of Stress Concerns -- -- Chronic Illness;Family;Poor Coping Skills;Unable to participate in former interests or hobbies;Unable to perform yard/household activities;Financial -- --          Psychosocial Discharge (Final Psychosocial Re-Evaluation):  Psychosocial Re-Evaluation - 07/22/19 1722      Psychosocial Re-Evaluation   Comments [name] is waiting for a new machine for the oxygen and it should be in this week.  Other didnt work.  She is waiting on authorization to see a counselor.    Expected Outcomes Short: follow up to see a counselor Long:  maintain mental health and positive attitude           Education: Education Goals: Education classes will be provided on a weekly basis, covering required topics. Participant will state understanding/return demonstration of topics presented.  Learning Barriers/Preferences:  Learning Barriers/Preferences - 04/03/19 1219      Learning Barriers/Preferences   Learning Barriers Hearing    Learning Preferences None           General Pulmonary Education Topics:  Infection Prevention: - Provides verbal and written material to individual with discussion of infection control including proper hand washing and proper equipment cleaning during exercise session.   Pulmonary Rehab from 10/11/2018 in [facility] Cardiac and  Pulmonary Rehab  Date 10/11/18  Educator AS  Instruction Review Code 1- Verbalizes Understanding      Falls Prevention: - Provides verbal and written material to individual with discussion of falls prevention and safety.   Pulmonary Rehab from 10/11/2018 in [facility] Cardiac and Pulmonary Rehab  Date 10/11/18  Educator AS  Instruction Review Code 1- Verbalizes Understanding      Chronic Lung Diseases: - Group verbal and written instruction to review updates, respiratory medications, advancements in procedures and treatments. Discuss use of supplemental oxygen including available portable oxygen systems, continuous and intermittent flow rates, concentrators, personal use and safety guidelines. Review proper use of inhaler and spacers. Provide informative websites for self-education.    Energy Conservation: - Provide group verbal and written instruction for methods to conserve energy, plan and organize activities. Instruct on pacing techniques, use of adaptive equipment and posture/positioning to relieve shortness of breath.   Triggers and Exacerbations: - Group verbal and written instruction to review types of environmental triggers and ways to prevent exacerbations. Discuss weather changes, air quality and the benefits of nasal washing. Review warning signs and symptoms to help prevent infections. Discuss techniques for effective airway clearance, coughing, and vibrations.   AED/CPR: - Group verbal and written instruction with the use of models to demonstrate the basic use of the AED with the basic ABC's of resuscitation.   Anatomy and Physiology of the Lungs: - Group verbal and written instruction with the use of models to provide basic lung anatomy and  physiology related to function, structure and complications of lung disease.   Anatomy & Physiology of the Heart: - Group verbal and written instruction and models provide basic cardiac anatomy and physiology, with the coronary  electrical and arterial systems. Review of Valvular disease and Heart Failure   Cardiac Medications: - Group verbal and written instruction to review commonly prescribed medications for heart disease. Reviews the medication, class of the drug, and side effects.   Other: -Provides group and verbal instruction on various topics (see comments)   Knowledge Questionnaire Score:  Knowledge Questionnaire Score - 04/09/19 1518      Knowledge Questionnaire Score   Pre Score 15/18            Core Components/Risk Factors/Patient Goals at Admission:  Personal Goals and Risk Factors at Admission - 04/09/19 1502      Core Components/Risk Factors/Patient Goals on Admission   Intervention Weight Management: Develop a combined nutrition and exercise program designed to reach desired caloric intake, while maintaining appropriate intake of nutrient and fiber, sodium and fats, and appropriate energy expenditure required for the weight goal.;Weight Management: Provide education and appropriate resources to help participant work on and attain dietary goals.;Weight Management/Obesity: Establish reasonable short term and long term weight goals.;Obesity: Provide education and appropriate resources to help participant work on and attain dietary goals.    Admit Weight 226 lb (102.5 kg)    Goal Weight: Short Term 215 lb (97.5 kg)    Goal Weight: Long Term 200 lb (90.7 kg)    Expected Outcomes Short Term: Continue to assess and modify interventions until short term weight is achieved;Long Term: Adherence to nutrition and physical activity/exercise program aimed toward attainment of established weight goal;Weight Loss: Understanding of general recommendations for a balanced deficit meal plan, which promotes 1-2 lb weight loss per week and includes a negative energy balance of [phone] kcal/d;Understanding recommendations for meals to include 15-35% energy as protein, 25-35% energy from fat, 35-60% energy from  carbohydrates, less than 276m of dietary cholesterol, 20-35 gm of total fiber daily;Understanding of distribution of calorie intake throughout the day with the consumption of 4-5 meals/snacks    Intervention Provide education on lifestyle modifcations including regular physical activity/exercise, weight management, moderate sodium restriction and increased consumption of fresh fruit, vegetables, and low fat dairy, alcohol moderation, and smoking cessation.;Monitor prescription use compliance.    Expected Outcomes Short Term: Continued assessment and intervention until BP is < 140/958mHG in hypertensive participants. < 130/8013mG in hypertensive participants with diabetes, heart failure or chronic kidney disease.;Long Term: Maintenance of blood pressure at goal levels.           Education:Diabetes - Individual verbal and written instruction to review signs/symptoms of diabetes, desired ranges of glucose level fasting, after meals and with exercise. Acknowledge that pre and post exercise glucose checks will be done for 3 sessions at entry of program.   Education: Know Your Numbers and Risk Factors: -Group verbal and written instruction about important numbers in your health.  Discussion of what are risk factors and how they play a role in the disease process.  Review of Cholesterol, Blood Pressure, Diabetes, and BMI and the role they play in your overall health.   Core Components/Risk Factors/Patient Goals Review:   Goals and Risk Factor Review    Row Name 05/13/19 1008 05/24/19 1118 05/27/19 1023 07/15/19 1727 07/22/19 1720     Core Components/Risk Factors/Patient Goals Review   Personal Goals Review Weight Management/Obesity;Hypertension;Lipids Weight Management/Obesity;Hypertension;Lipids;Stress;Develop more efficient  breathing techniques such as purse lipped breathing and diaphragmatic breathing and practicing self-pacing with activity.;Improve shortness of breath with ADL's Weight  Management/Obesity;Improve shortness of breath with ADL's;Lipids Weight Management/Obesity;Hypertension;Lipids;Improve shortness of breath with ADL's Weight Management/Obesity;Hypertension;Lipids;Improve shortness of breath with ADL's   Review [name] had first Covid vaccine last week.  She felt a little scratchy throat but is better now.  She is on allergy and cold medicine.  She is taking all other meds as directed.  Weight is staying about the same. With her new medication her lipid levels have been normal range and BP running 128-130s/70-low 80s. Taking medication as directed. Spoke to patient about their shortness of breath and what they can do to improve. Patient has been informed of breathing techniques when starting the program. Patient is informed to tell staff if they have had any med changes and that certain meds they are taking or not taking can be causing shortness of breath. Patient has checked her lipids recently and states that it was good. She does not have a blood pressure machine at home to check and does not want to pay for it. If she feels like it is high she checks it at [facility] or other stores. She has been out of town taking care of her sister and has not has time to exercise.She was able to walk a little in [place] when she was away. [name] is just getting back to regular attendance after being out of town.  She did walk some on vacation.  She plans to order a BP cuff and pulse ox to have at home.  She is taking all meds as directed.   Expected Outcomes Short:  continue with current plan Long:  manage risk factors Short:  continue with current plan Long:  manage risk factors Short: Attend [facility] regularly to improve shortness of breath with ADL's. Long: maintain independence with ADL's Short: obtain a blood pressure machine when she is able to. Long: Check blood pressure at home. Short: order BP cuff ond pulse ox Long: manage risk factors          Core Components/Risk Factors/Patient  Goals at Discharge (Final Review):   Goals and Risk Factor Review - 07/22/19 1720      Core Components/Risk Factors/Patient Goals Review   Personal Goals Review Weight Management/Obesity;Hypertension;Lipids;Improve shortness of breath with ADL's    Review [name] is just getting back to regular attendance after being out of town.  She did walk some on vacation.  She plans to order a BP cuff and pulse ox to have at home.  She is taking all meds as directed.    Expected Outcomes Short: order BP cuff ond pulse ox Long: manage risk factors           ITP Comments:  ITP Comments    Row Name 04/03/19 1240 04/10/19 1104 04/24/19 0553 05/22/19 0533 06/17/19 1437   ITP Comments Completed virtual orientation today.  EP evaluation is scheduled for Tuesday 3/9 at 2pm.  Documentation for diagnosis can be found in [facility] encounter 03/18/19.  She will restart on session number 8. First full day of exercise!  Patient was oriented to gym and equipment including functions, settings, policies, and procedures.  Patient's individual exercise prescription and treatment plan were reviewed.  All starting workloads were established based on the results of the 6 minute walk test done at initial orientation visit.  The plan for exercise progression was also introduced and progression will be customized based on patient's performance  and goals. 30 day chart review completed. ITP sent to Dr Zachery Dakins Medical Director, for review,changes as needed and signature. Continue with ITP if no changes requested    New to program 30 Day review completed. Medical Director review done, changes made as directed,and approval shown by signature of Market researcher. Michlle called out today for a doctor's appt.  Last attended on 4/30   Row Name 06/19/19 0535 07/17/19 0521 07/17/19 0627 08/14/19 0615     ITP Comments 30 Day review completed. ITP review done, changes made as directed,and approval shown by signature of  Careers adviser. 30 Day review completed. Medical Director ITP review done, changes made as directed,and signed approval by Medical Director. 30 Day review completed. Medical Director ITP review done, changes made as directed, and signed approval by Medical Director. 30 Day review completed. Medical Director ITP review done, changes made as directed, and signed approval by Medical Director.           Comments:

## 2019-08-15 ENCOUNTER — Ambulatory Visit: Payer: BC Managed Care – PPO

## 2019-08-19 ENCOUNTER — Ambulatory Visit: Payer: BC Managed Care – PPO

## 2019-08-21 ENCOUNTER — Ambulatory Visit: Payer: BC Managed Care – PPO

## 2019-08-21 DIAGNOSIS — G4733 Obstructive sleep apnea (adult) (pediatric): Secondary | ICD-10-CM | POA: Diagnosis not present

## 2019-08-22 ENCOUNTER — Telehealth: Payer: Self-pay

## 2019-08-22 ENCOUNTER — Ambulatory Visit: Payer: BC Managed Care – PPO

## 2019-08-22 NOTE — Telephone Encounter (Signed)
LMOM

## 2019-08-23 DIAGNOSIS — G4733 Obstructive sleep apnea (adult) (pediatric): Secondary | ICD-10-CM | POA: Diagnosis not present

## 2019-08-26 ENCOUNTER — Ambulatory Visit: Payer: BC Managed Care – PPO

## 2019-08-28 ENCOUNTER — Ambulatory Visit: Payer: BC Managed Care – PPO

## 2019-08-29 ENCOUNTER — Ambulatory Visit: Payer: BC Managed Care – PPO

## 2019-08-29 ENCOUNTER — Encounter: Payer: Self-pay | Admitting: *Deleted

## 2019-08-29 ENCOUNTER — Telehealth: Payer: Self-pay | Admitting: *Deleted

## 2019-08-29 DIAGNOSIS — R0609 Other forms of dyspnea: Secondary | ICD-10-CM

## 2019-08-29 NOTE — Telephone Encounter (Signed)
Called to check on patient.  She has been out on vacation at the beach.  She will return from vacation on 8/4 and return to rehab on 8/9.

## 2019-09-02 ENCOUNTER — Ambulatory Visit: Payer: BC Managed Care – PPO

## 2019-09-03 ENCOUNTER — Ambulatory Visit: Payer: BC Managed Care – PPO | Admitting: Thoracic Surgery (Cardiothoracic Vascular Surgery)

## 2019-09-03 ENCOUNTER — Other Ambulatory Visit: Payer: Self-pay

## 2019-09-03 ENCOUNTER — Ambulatory Visit
Admission: RE | Admit: 2019-09-03 | Discharge: 2019-09-03 | Disposition: A | Discharge status: Home / Self Care | Payer: BC Managed Care – PPO | Source: Ambulatory Visit | Attending: Thoracic Surgery (Cardiothoracic Vascular Surgery) | Admitting: Thoracic Surgery (Cardiothoracic Vascular Surgery)

## 2019-09-03 ENCOUNTER — Encounter: Payer: Self-pay | Admitting: Thoracic Surgery (Cardiothoracic Vascular Surgery)

## 2019-09-03 VITALS — BP 118/63 | HR 67 | Temp 97.8°F | Resp 20 | Ht 61.0 in | Wt 226.0 lb

## 2019-09-03 DIAGNOSIS — R918 Other nonspecific abnormal finding of lung field: Secondary | ICD-10-CM | POA: Diagnosis not present

## 2019-09-03 DIAGNOSIS — K449 Diaphragmatic hernia without obstruction or gangrene: Secondary | ICD-10-CM | POA: Diagnosis not present

## 2019-09-03 DIAGNOSIS — Z9889 Other specified postprocedural states: Secondary | ICD-10-CM

## 2019-09-03 DIAGNOSIS — J9811 Atelectasis: Secondary | ICD-10-CM | POA: Diagnosis not present

## 2019-09-03 DIAGNOSIS — J986 Disorders of diaphragm: Secondary | ICD-10-CM | POA: Diagnosis not present

## 2019-09-03 NOTE — Progress Notes (Signed)
[address]       Samantha Greene [address]             [phone]     HPI: [name] returns for scheduled follow-up visit  Samantha Greene is a 49 year old woman with a history of hypertension, hyperlipidemia, obesity, anxiety, depression, and eventration of the right hemidiaphragm.  She presented with shortness of breath.  She had asymmetric elevation of the anterior portion of the right hemidiaphragm.  She underwent plication on 03/23/2018.  She had some issues with continued dyspnea and also had a lot of pain issues.  She was on gabapentin for neuropathic pain.  In the interim since I last saw her she is no longer on gabapentin.  She does still have some pains in the area of the incision and along the right costal margin.  She is not taking any medication for that.  Most the time is very mild unless she lifts something heavy.  She was able to start pulmonary rehab again.  She has seen some significant improvement with that.  Overall she feels much better than she did back in January.  Past Medical History:  Diagnosis Date  . Anxiety   . Arthritis   . Depression    no meds  . Diaphragmatic hernia with obstruction, without gangrene 11/07/2018  . Dyspnea    can happen anytime  . Family history of breast cancer   . Family history of colon cancer   . Family history of lung cancer   . Family history of pancreatic cancer   . Family history of prostate cancer   . GERD (gastroesophageal reflux disease)   . Hernia, diaphragmatic   . History of kidney stones   . Hyperlipidemia   . Hypertension    no meds  . Pneumonia    1 time    Current Outpatient Medications  Medication Sig Dispense Refill  . albuterol (VENTOLIN HFA) 108 (90 Base) MCG/ACT inhaler INHALE 2 PUFFS INTO THE LUNGS EVERY 4 (FOUR) HOURS AS NEEDED FOR WHEEZING OR SHORTNESS OF BREATH. 18 g 2  . aspirin 81 MG tablet Take 81 mg by mouth daily.    . baclofen (LIORESAL) 10 MG tablet TAKE 1/2 TO 1  TABLET 1 OR 2 X /DAY AS NEEDED FOR MUSCLE SPASMS 180 tablet 1  . buPROPion (WELLBUTRIN XL) 300 MG 24 hr tablet Take 1 tablet every Morning for Mood, Focus & Concentration 90 tablet 3  . cetirizine (ZYRTEC) 10 MG tablet Take 10 mg by mouth at bedtime.    . Cholecalciferol (VITAMIN D) 125 MCG (5000 UT) CAPS Take 5,000 Units by mouth daily.     Marland Kitchen escitalopram (LEXAPRO) 20 MG tablet Take 1 tablet Daily for Mood 90 tablet 3  . Eszopiclone 3 MG TABS Take immediately before bedtime 30 tablet 3  . fexofenadine (ALLEGRA) 180 MG tablet Take 180 mg by mouth daily as needed for allergies or rhinitis. Alternates with zyrtec when Zyrtec seems to not be as effective    . fluocinolone (SYNALAR) 0.025 % ointment APPLY TO AFFECTED AREA TWICE A DAY 30 g 0  . MELATONIN PO Take 10 mg by mouth.    . meloxicam (MOBIC) 15 MG tablet Take 1/2 to 1 tablet Daily for Pain & Inflammation  (Limit to 4-5 tabs /week Tto Avoid Kidney Damage) 90 tablet 3  . montelukast (SINGULAIR) 10 MG tablet Take 1 tablet Daily for Allergies 90 tablet 0  . Multiple Vitamin (MULTIVITAMIN) tablet  Take 1 tablet by mouth daily.    Marland Kitchen nystatin cream (MYCOSTATIN) Apply 1 application topically 2 (two) times daily. (Patient taking differently: Apply 1 application topically 2 (two) times daily as needed for dry skin. ) 30 g 1  . omeprazole (PRILOSEC) 40 MG capsule TAKE 1 CAPSULE BY MOUTH EVERY DAY 90 capsule 3  . rosuvastatin (CRESTOR) 5 MG tablet TAKE 1 TABLET BY MOUTH EVERYDAY AT BEDTIME 90 tablet 3  . telmisartan (MICARDIS) 40 MG tablet TAKE 1 TABLET DAILY FOR BLOOD PRESSURE 90 tablet 3  . triamcinolone ointment (KENALOG) 0.1 % Apply 1 application topically 2 (two) times daily. (Patient taking differently: Apply 1 application topically daily as needed (rash). ) 80 g 1  . vitamin C (ASCORBIC ACID) 500 MG tablet Take 500 mg by mouth daily.    . vitamin E 600 UNIT capsule Take 600 Units by mouth daily.    . fluticasone (FLONASE) 50 MCG/ACT nasal spray  Place 2 sprays into both nostrils daily. 16 g 2   No current facility-administered medications for this visit.    Physical Exam BP 118/63   Pulse 67   Temp 97.8 F (36.6 C) (Skin)   Resp 20   Ht 5\' 1"  (1.549 m)   Wt 226 lb (102.5 kg)   SpO2 96% Comment: RA  BMI 42.21 kg/m  49 year old woman in no acute distress Alert and oriented x3 with no focal motor deficit Obese Cardiac regular rate and rhythm Lungs diminished breath sounds bilaterally, right base greater than left Incision well-healed  Diagnostic Tests: CHEST - 2 VIEW  COMPARISON:  Chest x-ray 02/12/2019.  FINDINGS: Mediastinum hilar structures normal. Heart size normal. Stable right base subsegmental atelectasis and or scarring. Stable elevation right hemidiaphragm. No pleural effusion or pneumothorax. No acute bony abnormality.  IMPRESSION: Stable right base subsegmental atelectasis and or scarring. Stable elevation right hemidiaphragm. No acute abnormality identified.   Electronically Signed   By: 04/12/2019  Register   On: 09/03/2019 13:46 I personally reviewed the chest x-ray images.  Impression: Samantha Greene is a 49 year old woman with a history of hypertension, hyperlipidemia, obesity, anxiety, depression, and eventration of the right hemidiaphragm.  She underwent plication of the anterior portion of her right hemidiaphragm about a year and a half ago.  She had a lot of neuropathic pain postoperatively.  And she was on gabapentin for about a year.  She now is off that medication.  She does still have some occasional neuropathic type pain, but she is able to manage that without medication now.  She may always have that to some degree.  Her chest x-ray shows there is no longer asymmetric elevation of the anterior portion of the right hemidiaphragm.  The right hemidiaphragm overall is a little higher than the left side.  She had rather limited improvement in her dyspnea after surgery.  Is not  surprising.  In her case her dyspnea is likely multifactorial.  She is seeing some improvement now that she has been able to start pulmonary rehab.  Overall she is in a much better place and is very motivated to exercise and lose weight.  Plan: Continue with pulmonary rehab Follow-up with Dr. 54 I will be happy to see Samantha Greene back anytime in the future if I can be of any further assistance with her care  I spent 10 minutes in review of records, review of images and in consultation with Samantha Greene today. Sherene Sires, MD [facility] [phone]

## 2019-09-04 ENCOUNTER — Ambulatory Visit: Payer: BC Managed Care – PPO

## 2019-09-05 ENCOUNTER — Ambulatory Visit (INDEPENDENT_AMBULATORY_CARE_PROVIDER_SITE_OTHER): Payer: BC Managed Care – PPO

## 2019-09-05 ENCOUNTER — Ambulatory Visit: Payer: BC Managed Care – PPO

## 2019-09-05 ENCOUNTER — Ambulatory Visit: Payer: BC Managed Care – PPO | Admitting: Obstetrics & Gynecology

## 2019-09-05 ENCOUNTER — Encounter: Payer: Self-pay | Admitting: Obstetrics & Gynecology

## 2019-09-05 ENCOUNTER — Other Ambulatory Visit: Payer: Self-pay

## 2019-09-05 DIAGNOSIS — N921 Excessive and frequent menstruation with irregular cycle: Secondary | ICD-10-CM

## 2019-09-05 DIAGNOSIS — R9389 Abnormal findings on diagnostic imaging of other specified body structures: Secondary | ICD-10-CM

## 2019-09-05 DIAGNOSIS — N840 Polyp of corpus uteri: Secondary | ICD-10-CM

## 2019-09-05 NOTE — Progress Notes (Signed)
    Samantha Greene 04-Jan-1971 [id number]        49 y.o.  G1P0A1   RP: Menometrorrhagia with thickened endometrium for Sonohysterogram  HPI: Menometrorrhagia with thickened endometrium.  Mild pelvic discomfort.  No abnormal vaginal discharge.  Urine and bowel movements normal.   OB History  Gravida Para Term Preterm AB Living  1 0     0 0  SAB TAB Ectopic Multiple Live Births      0        # Outcome Date GA Lbr Len/2nd Weight Sex Delivery Anes PTL Lv  1 Gravida             Past medical history,surgical history, problem list, medications, allergies, family history and social history were all reviewed and documented in the EPIC chart.   Directed ROS with pertinent positives and negatives documented in the history of present illness/assessment and plan.  Exam:  There were no vitals filed for this visit. General appearance:  Normal                                                                    Sono Infusion Hysterogram ( procedure note)   The initial transvaginal ultrasound demonstrated the following:  T/V images.  Limited ultrasound today due to recent ultrasound.  Endometrial lining is thickened at 7.52 mm.  The speculum  was inserted and the cervix cleansed with Betadine solution after confirming that patient has no allergies.A small sonohysterography catheterwas utilized.  Insertion was facilitated with ring forceps, using a spear-like motion the catheter was inserted to the fundus of the uterus. The speculum is then removed carefully to avoid dislodging the catheter. The catheter was flushed with sterile saline delete prior to insertion to rid it of small amounts of air.the sterile saline solution was infused into the uterine cavity as a vaginal ultrasound probe was then placed in the vagina for full visualization of the uterine cavity from a transvaginal approach. The following was noted:  Sonohysterogram performed using 0.9% normal saline.  After normal saline was  introduced into the intra uterine cavity, a filling defect was seen measured at 1.6 x 0.6 cm.  The catheter was then removed after retrieving some of the saline from the intrauterine cavity. An endometrial biopsy was not done. Patient tolerated procedure well. She had received a tablet of Aleve for discomfort.    Assessment/Plan:  49 y.o. G1P0000   1. Menometrorrhagia Menometrorrhagia with thickened endometrium.  Endometrial polyp Per sonohysterogram findings today.  Endometrial polyp measured at 1.6 x 0.6 cm.  Patient informed of the sonohysterogram findings.  Decision to proceed with hysteroscopy MyoSure excision of the polyp and dilation and curettage.  Hysteroscopy pamphlet given.  Postprocedure precautions reviewed.  Follow-up preop visit.  2. Thickened endometrium Endometrial polyp Per sonohysterogram.  3. Endometrial polyp Schedule HSC Myosure Excision of Polyp, D+C.  Pamphlet on HSC given.  F/U Preop visit.  Genia Del MD, 4:11 PM 09/05/2019

## 2019-09-09 ENCOUNTER — Ambulatory Visit: Payer: BC Managed Care – PPO

## 2019-09-11 ENCOUNTER — Ambulatory Visit: Payer: BC Managed Care – PPO

## 2019-09-11 ENCOUNTER — Encounter: Payer: Self-pay | Admitting: *Deleted

## 2019-09-11 DIAGNOSIS — R0609 Other forms of dyspnea: Secondary | ICD-10-CM

## 2019-09-11 NOTE — Progress Notes (Signed)
Pulmonary Individual Treatment Plan  Patient Details  Name: Samantha Greene MRN: [id number] Date of Birth: 26-Apr-1970 Referring Provider:     Pulmonary Rehab from 10/11/2018 in [facility] Cardiac and Pulmonary Rehab  Referring Provider [place]      Initial Encounter Date:    Pulmonary Rehab from 10/11/2018 in [facility] Cardiac and Pulmonary Rehab  Date 10/11/18      Visit Diagnosis: DOE (dyspnea on exertion)  Patient's Home Medications on Admission:  Current Outpatient Medications:  .  albuterol (VENTOLIN HFA) 108 (90 Base) MCG/ACT inhaler, INHALE 2 PUFFS INTO THE LUNGS EVERY 4 (FOUR) HOURS AS NEEDED FOR WHEEZING OR SHORTNESS OF BREATH., Disp: 18 g, Rfl: 2 .  aspirin 81 MG tablet, Take 81 mg by mouth daily., Disp: , Rfl:  .  baclofen (LIORESAL) 10 MG tablet, TAKE 1/2 TO 1 TABLET 1 OR 2 X /DAY AS NEEDED FOR MUSCLE SPASMS, Disp: 180 tablet, Rfl: 1 .  buPROPion (WELLBUTRIN XL) 300 MG 24 hr tablet, Take 1 tablet every Morning for Mood, Focus & Concentration, Disp: 90 tablet, Rfl: 3 .  cetirizine (ZYRTEC) 10 MG tablet, Take 10 mg by mouth at bedtime., Disp: , Rfl:  .  Cholecalciferol (VITAMIN D) 125 MCG (5000 UT) CAPS, Take 5,000 Units by mouth daily. , Disp: , Rfl:  .  escitalopram (LEXAPRO) 20 MG tablet, Take 1 tablet Daily for Mood, Disp: 90 tablet, Rfl: 3 .  Eszopiclone 3 MG TABS, Take immediately before bedtime, Disp: 30 tablet, Rfl: 3 .  fexofenadine (ALLEGRA) 180 MG tablet, Take 180 mg by mouth daily as needed for allergies or rhinitis. Alternates with zyrtec when Zyrtec seems to not be as effective, Disp: , Rfl:  .  fluocinolone (SYNALAR) 0.025 % ointment, APPLY TO AFFECTED AREA TWICE A DAY, Disp: 30 g, Rfl: 0 .  fluticasone (FLONASE) 50 MCG/ACT nasal spray, Place 2 sprays into both nostrils daily., Disp: 16 g, Rfl: 2 .  MELATONIN PO, Take 10 mg by mouth., Disp: , Rfl:  .  meloxicam (MOBIC) 15 MG tablet, Take 1/2 to 1 tablet Daily for Pain & Inflammation  (Limit to 4-5 tabs /week Tto Avoid  Kidney Damage), Disp: 90 tablet, Rfl: 3 .  montelukast (SINGULAIR) 10 MG tablet, Take 1 tablet Daily for Allergies, Disp: 90 tablet, Rfl: 0 .  Multiple Vitamin (MULTIVITAMIN) tablet, Take 1 tablet by mouth daily., Disp: , Rfl:  .  nystatin cream (MYCOSTATIN), Apply 1 application topically 2 (two) times daily. (Patient taking differently: Apply 1 application topically 2 (two) times daily as needed for dry skin. ), Disp: 30 g, Rfl: 1 .  omeprazole (PRILOSEC) 40 MG capsule, TAKE 1 CAPSULE BY MOUTH EVERY DAY, Disp: 90 capsule, Rfl: 3 .  rosuvastatin (CRESTOR) 5 MG tablet, TAKE 1 TABLET BY MOUTH EVERYDAY AT BEDTIME, Disp: 90 tablet, Rfl: 3 .  telmisartan (MICARDIS) 40 MG tablet, TAKE 1 TABLET DAILY FOR BLOOD PRESSURE, Disp: 90 tablet, Rfl: 3 .  triamcinolone ointment (KENALOG) 0.1 %, Apply 1 application topically 2 (two) times daily. (Patient taking differently: Apply 1 application topically daily as needed (rash). ), Disp: 80 g, Rfl: 1 .  vitamin C (ASCORBIC ACID) 500 MG tablet, Take 500 mg by mouth daily., Disp: , Rfl:  .  vitamin E 600 UNIT capsule, Take 600 Units by mouth daily., Disp: , Rfl:   Past Medical History: Past Medical History:  Diagnosis Date  . Anxiety   . Arthritis   . Depression    no meds  . Diaphragmatic  hernia with obstruction, without gangrene 11/07/2018  . Dyspnea    can happen anytime  . Family history of breast cancer   . Family history of colon cancer   . Family history of lung cancer   . Family history of pancreatic cancer   . Family history of prostate cancer   . GERD (gastroesophageal reflux disease)   . Hernia, diaphragmatic   . History of kidney stones   . Hyperlipidemia   . Hypertension    no meds  . Pneumonia    1 time    Tobacco Use: Social History   Tobacco Use  Smoking Status Never Smoker  Smokeless Tobacco Never Used    Labs: Recent Review Flowsheet Data    Labs for ITP Cardiac and Pulmonary Rehab Latest Ref Rng & Units 03/24/2018 09/11/2018  12/12/2018 03/18/2019 06/17/2019   Cholestrol <200 mg/dL - 226(H) 170 174 168   LDLCALC mg/dL (calc) - 132(H) 78 82 79   HDL > OR = 50 mg/dL - 62 63 69 65   Trlycerides <150 mg/dL - 188(H) 194(H) 136 143   Hemoglobin A1c <5.7 % of total Hgb - 5.6 5.4 5.6 5.5   PHART 7.35 - 7.45 7.361 - - - -   PCO2ART 32 - 48 mmHg 45.9 - - - -   HCO3 20.0 - 28.0 mmol/L 25.4 - - - -   ACIDBASEDEF 0.0 - 2.0 mmol/L - - - - -   O2SAT % 96.7 - - - -       Pulmonary Assessment Scores:  Pulmonary Assessment Scores    Row Name 04/09/19 1513         ADL UCSD   SOB Score total 62     Rest 2     Walk 4     Stairs 5     Bath 2     Dress 2     Shop 2       CAT Score   CAT Score 25       mMRC Score   mMRC Score 2            UCSD: Self-administered rating of dyspnea associated with activities of daily living (ADLs) 6-point scale (0 = "not at all" to 5 = "maximal or unable to do because of breathlessness")  Scoring Scores range from 0 to 120.  Minimally important difference is 5 units  CAT: CAT can identify the health impairment of COPD patients and is better correlated with disease progression.  CAT has a scoring range of zero to 40. The CAT score is classified into four groups of low (less than 10), medium (10 - 20), high (21-30) and very high (31-40) based on the impact level of disease on health status. A CAT score over 10 suggests significant symptoms.  A worsening CAT score could be explained by an exacerbation, poor medication adherence, poor inhaler technique, or progression of COPD or comorbid conditions.  CAT MCID is 2 points  mMRC: mMRC (Modified Medical Research Council) Dyspnea Scale is used to assess the degree of baseline functional disability in patients of respiratory disease due to dyspnea. No minimal important difference is established. A decrease in score of 1 point or greater is considered a positive change.   Pulmonary Function Assessment:   Exercise Target Goals: Exercise  Program Goal: Individual exercise prescription set using results from initial 6 min walk test and THRR while considering  patient's activity barriers and safety.   Exercise Prescription Goal: Initial exercise  prescription builds to 30-45 minutes a day of aerobic activity, 2-3 days per week.  Home exercise guidelines will be given to patient during program as part of exercise prescription that the participant will acknowledge.  Education: Aerobic Exercise & Resistance Training: - Gives group verbal and written instruction on the various components of exercise. Focuses on aerobic and resistive training programs and the benefits of this training and how to safely progress through these programs..   Education: Exercise & Equipment Safety: - Individual verbal instruction and demonstration of equipment use and safety with use of the equipment.   Pulmonary Rehab from 10/11/2018 in [facility] Cardiac and Pulmonary Rehab  Date 10/11/18  Educator AS  Instruction Review Code 1- Verbalizes Understanding      Education: Exercise Physiology & General Exercise Guidelines: - Group verbal and written instruction with models to review the exercise physiology of the cardiovascular system and associated critical values. Provides general exercise guidelines with specific guidelines to those with heart or lung disease.    Education: Flexibility, Balance, Mind/Body Relaxation: Provides group verbal/written instruction on the benefits of flexibility and balance training, including mind/body exercise modes such as yoga, pilates and tai chi.  Demonstration and skill practice provided.   Activity Barriers & Risk Stratification:  Activity Barriers & Cardiac Risk Stratification - 04/03/19 1218      Activity Barriers & Cardiac Risk Stratification   Activity Barriers Deconditioning;Muscular Weakness;Shortness of Breath;History of Falls           6 Minute Walk:  6 Minute Walk    Row Name 04/09/19 1504         6  Minute Walk   Phase Initial     Distance 1200 feet     Walk Time 6 minutes     # of Rest Breaks 0     MPH 2.27     METS 3.37     RPE 17     Perceived Dyspnea  3     VO2 Peak 11.81     Symptoms No     Resting HR 79 bpm     Resting BP 136/84     Resting Oxygen Saturation  97 %     Exercise Oxygen Saturation  during 6 min walk 94 %     Max Ex. HR 105 bpm     Max Ex. BP 164/82     2 Minute Post BP 138/86       Interval HR   1 Minute HR 92     2 Minute HR 100     3 Minute HR 103     4 Minute HR 105     5 Minute HR 104     6 Minute HR 103     2 Minute Post HR 86     Interval Heart Rate? Yes       Interval Oxygen   Interval Oxygen? Yes     Baseline Oxygen Saturation % 97 %     1 Minute Oxygen Saturation % 98 %     1 Minute Liters of Oxygen 0 L     2 Minute Oxygen Saturation % 94 %     2 Minute Liters of Oxygen 0 L     3 Minute Oxygen Saturation % 95 %     3 Minute Liters of Oxygen 0 L     4 Minute Oxygen Saturation % 95 %     4 Minute Liters of Oxygen 0 L  5 Minute Oxygen Saturation % 95 %     5 Minute Liters of Oxygen 0 L     6 Minute Oxygen Saturation % 96 %     6 Minute Liters of Oxygen 0 L     2 Minute Post Oxygen Saturation % 99 %     2 Minute Post Liters of Oxygen 0 L           Oxygen Initial Assessment:  Oxygen Initial Assessment - 04/03/19 1221      Home Oxygen   Home Oxygen Device None    Sleep Oxygen Prescription CPAP    Home Exercise Oxygen Prescription None    Home at Rest Exercise Oxygen Prescription None    Compliance with Home Oxygen Use Yes      Initial 6 min Walk   Oxygen Used None      Program Oxygen Prescription   Program Oxygen Prescription None      Intervention   Short Term Goals To learn and exhibit compliance with exercise, home and travel O2 prescription;To learn and understand importance of monitoring SPO2 with pulse oximeter and demonstrate accurate use of the pulse oximeter.;To learn and understand importance of maintaining  oxygen saturations>88%;To learn and demonstrate proper pursed lip breathing techniques or other breathing techniques.;To learn and demonstrate proper use of respiratory medications    Long  Term Goals Exhibits compliance with exercise, home and travel O2 prescription;Verbalizes importance of monitoring SPO2 with pulse oximeter and return demonstration;Maintenance of O2 saturations>88%;Exhibits proper breathing techniques, such as pursed lip breathing or other method taught during program session;Compliance with respiratory medication;Demonstrates proper use of MDI's           Oxygen Re-Evaluation:  Oxygen Re-Evaluation    Row Name 04/10/19 1107 05/24/19 1116 05/27/19 1019 07/15/19 1720 07/22/19 1734     Program Oxygen Prescription   Program Oxygen Prescription _0      Home Oxygen   Home Oxygen Device None None None Home Concentrator Home Concentrator   Sleep Oxygen Prescription CPAP CPAP CPAP Continuous;CPAP Continuous;CPAP   Liters per minute -- 6 0 2 2   Home Exercise Oxygen Prescription _1    Home at Rest Exercise Oxygen Prescription _2    Compliance with Home Oxygen Use _3      Goals/Expected Outcomes   Short Term Goals To learn and exhibit compliance with exercise, home and travel O2 prescription;To learn and understand importance of monitoring SPO2 with pulse oximeter and demonstrate accurate use of the pulse oximeter.;To learn and understand importance of maintaining oxygen saturations>88%;To learn and demonstrate proper pursed lip breathing techniques or other breathing techniques.;To learn and demonstrate proper use of respiratory medications To learn and exhibit compliance with exercise, home and travel O2 prescription;To learn and understand importance of monitoring SPO2 with pulse oximeter and demonstrate accurate use of the pulse oximeter.;To learn and understand importance of maintaining oxygen  saturations>88%;To learn and demonstrate proper pursed lip breathing techniques or other breathing techniques.;To learn and demonstrate proper use of respiratory medications To learn and exhibit compliance with exercise, home and travel O2 prescription;To learn and demonstrate proper pursed lip breathing techniques or other breathing techniques.;To learn and demonstrate proper use of respiratory medications;To learn and understand importance of monitoring SPO2 with pulse oximeter and demonstrate accurate use of the pulse oximeter. To learn and demonstrate proper use of respiratory medications;To learn and understand importance of maintaining oxygen saturations>88%;To learn and understand importance  of monitoring SPO2 with pulse oximeter and demonstrate accurate use of the pulse oximeter.;To learn and exhibit compliance with exercise, home and travel O2 prescription;To learn and demonstrate proper pursed lip breathing techniques or other breathing techniques. To learn and demonstrate proper use of respiratory medications;To learn and understand importance of maintaining oxygen saturations>88%;To learn and understand importance of monitoring SPO2 with pulse oximeter and demonstrate accurate use of the pulse oximeter.;To learn and exhibit compliance with exercise, home and travel O2 prescription;To learn and demonstrate proper pursed lip breathing techniques or other breathing techniques.   Long  Term Goals Exhibits compliance with exercise, home and travel O2 prescription;Verbalizes importance of monitoring SPO2 with pulse oximeter and return demonstration;Maintenance of O2 saturations>88%;Exhibits proper breathing techniques, such as pursed lip breathing or other method taught during program session;Compliance with respiratory medication;Demonstrates proper use of MDI's Exhibits compliance with exercise, home and travel O2 prescription;Verbalizes importance of monitoring SPO2 with pulse oximeter and return  demonstration;Maintenance of O2 saturations>88%;Exhibits proper breathing techniques, such as pursed lip breathing or other method taught during program session;Compliance with respiratory medication;Demonstrates proper use of MDI's Exhibits compliance with exercise, home and travel O2 prescription;Verbalizes importance of monitoring SPO2 with pulse oximeter and return demonstration;Exhibits proper breathing techniques, such as pursed lip breathing or other method taught during program session;Compliance with respiratory medication;Demonstrates proper use of MDI's Compliance with respiratory medication;Demonstrates proper use of MDI's;Exhibits proper breathing techniques, such as pursed lip breathing or other method taught during program session;Maintenance of O2 saturations>88%;Verbalizes importance of monitoring SPO2 with pulse oximeter and return demonstration;Exhibits compliance with exercise, home and travel O2 prescription Compliance with respiratory medication;Demonstrates proper use of MDI's;Exhibits proper breathing techniques, such as pursed lip breathing or other method taught during program session;Maintenance of O2 saturations>88%;Verbalizes importance of monitoring SPO2 with pulse oximeter and return demonstration;Exhibits compliance with exercise, home and travel O2 prescription   Comments Reviewed PLB technique with pt.  Talked about how it works and it's importance in maintaining their exercise saturations. Talking to doctor about BIPAP or diphibulator machine. Reviewed PLB and diaphramic breathing. She does have a pulse oximeter to check her oxygen saturation at home. Explained why it is important to have one. Reviewed that oxygen saturations should be 88 percent and above. Patient has her smart watch to check oxygen and heart rate. [name] is back from [place] and is now going to be on home oxygen at night, She wears her CPAP routinely and is going to bleed in oxygen into her CPAP machine.  [name] is waiting for new machine for the oxygen as the other didnt work.   Goals/Expected Outcomes Short: Become more profiecient at using PLB.   Long: Become independent at using PLB. Short: Become more profiecient at using PLB.   Long: Become independent at using PLB. Short: monitor oxygen at home with exertion. Long: maintain oxygen saturations above 88 percent independently. Short: Use CPAP machine with oxygen. Long: maintain oxygen device and CPAP o2 delivery independently. Short: Use CPAP machine with oxygen. Long: maintain oxygen device and CPAP o2 delivery independently.          Oxygen Discharge (Final Oxygen Re-Evaluation):  Oxygen Re-Evaluation - 07/22/19 1734      Program Oxygen Prescription   Program Oxygen Prescription None      Home Oxygen   Home Oxygen Device Home Concentrator    Sleep Oxygen Prescription Continuous;CPAP    Liters per minute 2    Home Exercise Oxygen Prescription None    Home at Rest Exercise Oxygen  Prescription None    Compliance with Home Oxygen Use Yes      Goals/Expected Outcomes   Short Term Goals To learn and demonstrate proper use of respiratory medications;To learn and understand importance of maintaining oxygen saturations>88%;To learn and understand importance of monitoring SPO2 with pulse oximeter and demonstrate accurate use of the pulse oximeter.;To learn and exhibit compliance with exercise, home and travel O2 prescription;To learn and demonstrate proper pursed lip breathing techniques or other breathing techniques.    Long  Term Goals Compliance with respiratory medication;Demonstrates proper use of MDI's;Exhibits proper breathing techniques, such as pursed lip breathing or other method taught during program session;Maintenance of O2 saturations>88%;Verbalizes importance of monitoring SPO2 with pulse oximeter and return demonstration;Exhibits compliance with exercise, home and travel O2 prescription    Comments [name] is waiting for new  machine for the oxygen as the other didnt work.    Goals/Expected Outcomes Short: Use CPAP machine with oxygen. Long: maintain oxygen device and CPAP o2 delivery independently.           Initial Exercise Prescription:  Initial Exercise Prescription - 04/09/19 1500      Treadmill   MPH 2.2    Grade 1.5    Minutes 15    METs 3.1      Recumbant Bike   Level 2    RPM 50    Watts 45    Minutes 15    METs 3.1      Arm Ergometer   Level 1    RPM 30    Minutes 15    METs 2.5      Elliptical   Level 1    Speed 3    Minutes 15      Intensity   THRR 40-80% of Max Heartrate 116-153    Ratings of Perceived Exertion 11-15    Perceived Dyspnea 0-4      Resistance Training   Training Prescription Yes    Weight 3 lb    Reps 10-15           Perform Capillary Blood Glucose checks as needed.  Exercise Prescription Changes:  Exercise Prescription Changes    Row Name 04/11/19 1200 05/07/19 1500 05/22/19 1100 06/03/19 1400 07/02/19 1500     Response to Exercise   Blood Pressure (Admit) 130/78 136/78 124/82 118/78 106/76   Blood Pressure (Exercise) 170/82 152/82 162/80 180/98 150/70   Blood Pressure (Exit) 116/84 132/70 126/74 132/78 122/72   Heart Rate (Admit) 72 bpm 86 bpm 83 bpm 94 bpm 88 bpm   Heart Rate (Exercise) 101 bpm 120 bpm 122 bpm 116 bpm 144 bpm   Heart Rate (Exit) 95 bpm 98 bpm 100 bpm 104 bpm 90 bpm   Oxygen Saturation (Admit) 98 % 98 % 95 % 97 % 98 %   Oxygen Saturation (Exercise) 95 % 94 % 94 % 95 % 94 %   Oxygen Saturation (Exit) 96 % 98 % 96 % 94 % 96 %   Rating of Perceived Exertion (Exercise) _0 Perceived Dyspnea (Exercise) _1 Symptoms _2    Duration Continue with 30 min of aerobic exercise without signs/symptoms of physical distress. Continue with 30 min of aerobic exercise without signs/symptoms of physical distress. Continue with 30 min of aerobic exercise without signs/symptoms of physical distress.  Continue with 30 min of aerobic exercise without signs/symptoms of physical distress. Continue with 30 min of aerobic exercise  without signs/symptoms of physical distress.   Intensity _0      Progression   Progression -- Continue to progress workloads to maintain intensity without signs/symptoms of physical distress. Continue to progress workloads to maintain intensity without signs/symptoms of physical distress. Continue to progress workloads to maintain intensity without signs/symptoms of physical distress. Continue to progress workloads to maintain intensity without signs/symptoms of physical distress.   Average METs -- 2.8 3.07 3.4 3.5     Resistance Training   Training Prescription _1    Weight 3 lb 3 lb 3 lb 3 lb 3 lb   Reps 10-15 10-15 10-15 10-15 10-15     Interval Training   Interval Training _2      Treadmill   MPH -- -- -- -- 2.9   Grade -- -- -- -- 2   Minutes -- -- -- -- 15   METs -- -- -- -- 4     Recumbant Bike   Level _3 --   RPM 50 50 -- 50 --   Watts 15 -- 30 36 --   Minutes _4 --   METs 2.47 3.39 3.31 3 --     Arm Ergometer   Level _5 --   RPM 30 30 -- 30 --   Minutes _6 --   METs 2.8 2.2 3.2 3.4 --     Elliptical   Level -- -- -- -- 1   Minutes -- -- -- -- 15   Row Name 07/17/19 1400 07/31/19 1000 08/12/19 1300         Response to Exercise   Blood Pressure (Admit) 132/76 124/80 140/82     Blood Pressure (Exercise) 188/80 188/76 144/78     Blood Pressure (Exit) 130/72 132/78 124/84     Heart Rate (Admit) 98 bpm 86 bpm 92 bpm     Heart Rate (Exercise) 121 bpm 123 bpm 122 bpm     Heart Rate (Exit) 96 bpm 99 bpm 102 bpm     Oxygen Saturation (Admit) 96 % 100 % 98 %     Oxygen Saturation (Exercise) 94 % 94 % 97 %     Oxygen Saturation (Exit) 97 % 94 % 96 %     Rating of Perceived Exertion (Exercise) _7 Perceived  Dyspnea (Exercise) _8 Symptoms none none none     Duration Continue with 30 min of aerobic exercise without signs/symptoms of physical distress. Continue with 30 min of aerobic exercise without signs/symptoms of physical distress. Continue with 30 min of aerobic exercise without signs/symptoms of physical distress.     Intensity THRR unchanged THRR unchanged THRR unchanged       Progression   Progression Continue to progress workloads to maintain intensity without signs/symptoms of physical distress. Continue to progress workloads to maintain intensity without signs/symptoms of physical distress. Continue to progress workloads to maintain intensity without signs/symptoms of physical distress.     Average METs 3.22 3.45 3.26       Resistance Training   Training Prescription Yes Yes Yes     Weight 3 lb 5 lb 5 lb     Reps 10-15 10-15 10-15       Interval Training   Interval Training No No No       Recumbant Bike  Level _0 RPM -- 60 --     Watts 32 -- 45     Minutes _1 METs 2.43 3.4 4.02       Arm Ergometer   Level _2 RPM -- 30 --     Minutes _3 METs 3.5 3.5 3.1       Home Exercise Plan   Plans to continue exercise at Home (comment)  walk Home (comment)  walk Home (comment)  walk     Frequency Add 2 additional days to program exercise sessions. Add 2 additional days to program exercise sessions. Add 2 additional days to program exercise sessions.     Initial Home Exercises Provided 05/13/19 05/13/19 05/13/19            Exercise Comments:   Exercise Goals and Review:  Exercise Goals    Row Name 04/09/19 1519             Exercise Goals   Increase Physical Activity Yes       Intervention Provide advice, education, support and counseling about physical activity/exercise needs.;Develop an individualized exercise prescription for aerobic and resistive training based on initial evaluation findings, risk stratification, comorbidities  and participant's personal goals.       Expected Outcomes Short Term: Attend rehab on a regular basis to increase amount of physical activity.;Long Term: Add in home exercise to make exercise part of routine and to increase amount of physical activity.;Long Term: Exercising regularly at least 3-5 days a week.       Increase Strength and Stamina Yes       Intervention Provide advice, education, support and counseling about physical activity/exercise needs.;Develop an individualized exercise prescription for aerobic and resistive training based on initial evaluation findings, risk stratification, comorbidities and participant's personal goals.       Expected Outcomes Short Term: Increase workloads from initial exercise prescription for resistance, speed, and METs.;Short Term: Perform resistance training exercises routinely during rehab and add in resistance training at home;Long Term: Improve cardiorespiratory fitness, muscular endurance and strength as measured by increased METs and functional capacity (6MWT)       Able to understand and use rate of perceived exertion (RPE) scale Yes       Intervention Provide education and explanation on how to use RPE scale       Expected Outcomes Short Term: Able to use RPE daily in rehab to express subjective intensity level;Long Term:  Able to use RPE to guide intensity level when exercising independently       Able to understand and use Dyspnea scale Yes       Intervention Provide education and explanation on how to use Dyspnea scale       Expected Outcomes Short Term: Able to use Dyspnea scale daily in rehab to express subjective sense of shortness of breath during exertion;Long Term: Able to use Dyspnea scale to guide intensity level when exercising independently       Knowledge and understanding of Target Heart Rate Range (THRR) Yes       Intervention Provide education and explanation of THRR including how the numbers were predicted and where they are located for  reference       Expected Outcomes Short Term: Able to state/look up THRR;Short Term: Able to use daily as guideline for intensity in rehab;Long Term: Able to use THRR to govern intensity  when exercising independently       Able to check pulse independently Yes       Intervention Provide education and demonstration on how to check pulse in carotid and radial arteries.;Review the importance of being able to check your own pulse for safety during independent exercise       Expected Outcomes Short Term: Able to explain why pulse checking is important during independent exercise;Long Term: Able to check pulse independently and accurately       Understanding of Exercise Prescription Yes       Intervention Provide education, explanation, and written materials on patient's individual exercise prescription       Expected Outcomes Short Term: Able to explain program exercise prescription;Long Term: Able to explain home exercise prescription to exercise independently              Exercise Goals Re-Evaluation :  Exercise Goals Re-Evaluation    Row Name 04/10/19 1105 04/11/19 1300 04/24/19 0933 05/07/19 1540 05/13/19 1011     Exercise Goal Re-Evaluation   Exercise Goals Review Increase Physical Activity;Increase Strength and Stamina;Understanding of Exercise Prescription;Able to understand and use Dyspnea scale;Able to understand and use rate of perceived exertion (RPE) scale;Knowledge and understanding of Target Heart Rate Range (THRR) Increase Physical Activity;Increase Strength and Stamina;Able to understand and use rate of perceived exertion (RPE) scale;Able to understand and use Dyspnea scale;Knowledge and understanding of Target Heart Rate Range (THRR);Able to check pulse independently -- Increase Physical Activity;Increase Strength and Stamina;Able to understand and use rate of perceived exertion (RPE) scale;Able to understand and use Dyspnea scale;Knowledge and understanding of Target Heart Rate Range  (THRR);Able to check pulse independently;Understanding of Exercise Prescription Increase Physical Activity;Increase Strength and Stamina;Able to understand and use rate of perceived exertion (RPE) scale;Able to understand and use Dyspnea scale;Knowledge and understanding of Target Heart Rate Range (THRR);Able to check pulse independently;Understanding of Exercise Prescription   Comments Reviewed RPE scale, THR and program prescription with pt today.  Pt voiced understanding and was given a copy of goals to take home. -- Out sick since last review. [place] is just returning after being ill. Wanza is using her 5 lb weights at home.  This EP will give info on [employer]   Expected Outcomes Short: Use RPE daily to regulate intensity. Long: Follow program prescription in THR. -- -- Short : build back up after being ill Long: build overall stamina Short: consider walking at the mall or water aerobics  Long:  exercise 3-5 days per week   Row Name 05/22/19 1008 05/24/19 1113 05/27/19 1016 06/03/19 1427 06/17/19 1438     Exercise Goal Re-Evaluation   Exercise Goals Review Increase Physical Activity;Increase Strength and Stamina;Understanding of Exercise Prescription Increase Physical Activity;Increase Strength and Stamina;Understanding of Exercise Prescription Increase Physical Activity;Increase Strength and Stamina;Understanding of Exercise Prescription Increase Physical Activity;Increase Strength and Stamina;Able to understand and use rate of perceived exertion (RPE) scale;Able to understand and use Dyspnea scale;Knowledge and understanding of Target Heart Rate Range (THRR);Able to check pulse independently;Understanding of Exercise Prescription --   Comments [name] is doing well in rehab.  She is excited to be back and enjoys getting in her exericse.  She is doing 2.8 mph on treadmill.  We will continue to monitor her progress. [name] is doing well in rehab.  She is excited to be back and enjoys getting  in her exericse.  5 lb weights at home 1x/week 10 minutes. Walking at home for 30 minutes 2x/week trail with hills:  breathing gets difficult, not able to talk during hills, will stop to catch breath. She is not doing well with exercise at home and would like to start going to [employer] again. Her doctor states that she does not want her to go due to covid. She is due for her second vaccine soon and will ask her doctor if she will be able to go after her vaccince. [name] is up to level 5 on the XR.  Staff will encourage increasing weight strength training. Out since last review. Last attended on 4/30   Expected Outcomes Short: Increase workload on XR  Long: Continue to improve stamina. Short: working towards 1 hour for walking at home  Long: Continue to improve stamina. Short: ask doctor if she can go back to the gym after her vaccine. Long: Start going to the gym 2 days a week. Short: increase to 4-5 lb for strength work Long: improve overall MET level and stamina --   Row Name 07/02/19 1534 07/15/19 1742 07/17/19 1436 07/22/19 1726 07/31/19 1016     Exercise Goal Re-Evaluation   Exercise Goals Review Increase Physical Activity;Increase Strength and Stamina;Able to understand and use rate of perceived exertion (RPE) scale;Knowledge and understanding of Target Heart Rate Range (THRR) Increase Physical Activity;Increase Strength and Stamina Increase Physical Activity;Increase Strength and Stamina;Understanding of Exercise Prescription Increase Physical Activity;Increase Strength and Stamina;Understanding of Exercise Prescription Increase Physical Activity;Increase Strength and Stamina;Understanding of Exercise Prescription   Comments [name] has switched to a different time slot that works better for her.  She has tried the ellpitical an dit si challenging for her. Staff will monitor progress. Patient is going to start going to [employer] for exercise. It will help her keep her strength up and  improve her mood. [name] returned this week after being out for almost a month in [place] with family. She was able to return without a problem and picked right back up. WE will continue to monitor her progress. [name] has a sore place around rib cage from movogn oxygen tanks.  She is doing seated machines and will just stretch instead of doing weights today. [name] is progressing well with exercise and seeing small but steady gains in MET level.  Staff will monitor progress.   Expected Outcomes Short: get back to consistent attendance  Long:  increase stamina Short: start going to [employer]. Long: maintain a workout routine post [facility]. Short: Return to regular attendance Long: Continue to improve stamina. Short: Return to regular attendance Long: Continue to improve stamina. Short: exrecise at least 3 days per week Long: increase MET level   Row Name 08/12/19 1329             Exercise Goal Re-Evaluation   Exercise Goals Review Increase Physical Activity;Increase Strength and Stamina;Understanding of Exercise Prescription       Comments [name] has only attended once since last review.  She would benefit from improved attendance.  We will continue to monitor her progress.       Expected Outcomes Short: Improved attendance Long: Continue to improve stamina.              Discharge Exercise Prescription (Final Exercise Prescription Changes):  Exercise Prescription Changes - 08/12/19 1300      Response to Exercise   Blood Pressure (Admit) 140/82    Blood Pressure (Exercise) 144/78    Blood Pressure (Exit) 124/84    Heart Rate (Admit) 92 bpm    Heart Rate (Exercise) 122 bpm  Heart Rate (Exit) 102 bpm    Oxygen Saturation (Admit) 98 %    Oxygen Saturation (Exercise) 97 %    Oxygen Saturation (Exit) 96 %    Rating of Perceived Exertion (Exercise) 14    Perceived Dyspnea (Exercise) 3    Symptoms none    Duration Continue with 30 min of aerobic exercise without signs/symptoms  of physical distress.    Intensity THRR unchanged      Progression   Progression Continue to progress workloads to maintain intensity without signs/symptoms of physical distress.    Average METs 3.26      Resistance Training   Training Prescription Yes    Weight 5 lb    Reps 10-15      Interval Training   Interval Training No      Recumbant Bike   Level 3    Watts 45    Minutes 15    METs 4.02      Arm Ergometer   Level 1    Minutes 15    METs 3.1      Home Exercise Plan   Plans to continue exercise at Home (comment)   walk   Frequency Add 2 additional days to program exercise sessions.    Initial Home Exercises Provided 05/13/19           Nutrition:  Target Goals: Understanding of nutrition guidelines, daily intake of sodium <1580m, cholesterol <20105m calories 30% from fat and 7% or less from saturated fats, daily to have 5 or more servings of fruits and vegetables.  Education: Controlling Sodium/Reading Food Labels -Group verbal and written material supporting the discussion of sodium use in heart healthy nutrition. Review and explanation with models, verbal and written materials for utilization of the food label.   Education: General Nutrition Guidelines/Fats and Fiber: -Group instruction provided by verbal, written material, models and posters to present the general guidelines for heart healthy nutrition. Gives an explanation and review of dietary fats and fiber.   Biometrics:    Nutrition Therapy Plan and Nutrition Goals:   Nutrition Assessments:   MEDIFICTS Score Key:          ?70 Need to make dietary changes          40-70 Heart Healthy Diet         ? 40 Therapeutic Level Cholesterol Diet  Nutrition Goals Re-Evaluation:  Nutrition Goals Re-Evaluation    [place] 06/24/19 1726 07/22/19 1727           Goals   Nutrition Goal ST: Pt wants to try hello fresh, stress and sleep management, exercise nutrition (pre/post workout meals/snacks) LT:  Weight loss 50lb (5-10lbs now) MiSharyn Lulls still working on goals set by RD.  She was able to keep up healthy eating while out of town.      Comment Pt discouraged that she is not losing weight. Discussed weight cycling, set point weight, health vs weight, role of stress and sleep in weight, role of lean muscle tissue and exercise MNT, empty calories such as sugar sweetened beverages. --      Expected Outcome ST: Pt wants to try hello fresh, stress and sleep management, exercise nutrition (pre/post workout meals/snacks) LT: Weight loss 50lb (5-10lbs now) Short:  continue working on actions suggested by RD Long: achieve goal weight             Nutrition Goals Discharge (Final Nutrition Goals Re-Evaluation):  Nutrition Goals Re-Evaluation - 07/22/19 1727  Goals   Nutrition Goal [name] is still working on goals set by RD.  She was able to keep up healthy eating while out of town.    Expected Outcome Short:  continue working on actions suggested by RD Long: achieve goal weight           Psychosocial: Target Goals: Acknowledge presence or absence of significant depression and/or stress, maximize coping skills, provide positive support system. Participant is able to verbalize types and ability to use techniques and skills needed for reducing stress and depression.   Education: Depression - Provides group verbal and written instruction on the correlation between heart/lung disease and depressed mood, treatment options, and the stigmas associated with seeking treatment.   Education: Sleep Hygiene -Provides group verbal and written instruction about how sleep can affect your health.  Define sleep hygiene, discuss sleep cycles and impact of sleep habits. Review good sleep hygiene tips.    Education: Stress and Anxiety: - Provides group verbal and written instruction about the health risks of elevated stress and causes of high stress.  Discuss the correlation between heart/lung disease and  anxiety and treatment options. Review healthy ways to manage with stress and anxiety.   Initial Review & Psychosocial Screening:  Initial Psych Review & Screening - 04/03/19 1219      Initial Review   Current issues with Current Psychotropic Meds;Current Sleep Concerns;Current Depression;Current Stress Concerns    Source of Stress Concerns Chronic Illness;Family;Poor Coping Skills;Unable to participate in former interests or hobbies;Unable to perform yard/household activities    Comments She stresses about many things.  She has been caring for her aunt over the past year.  She has lost several family members to suicide and murder. She wants to breathe better to be able to do more at home.      Family Dynamics   Good Support System? Yes   Husband, aunt, son   Comments Recent loss of family and friends (4 in the last two months)      Barriers   Psychosocial barriers to participate in program The patient should benefit from training in stress management and relaxation.;Psychosocial barriers identified (see note)      Screening Interventions   Interventions Encouraged to exercise;To provide support and resources with identified psychosocial needs;Provide feedback about the scores to participant    Expected Outcomes Short Term goal: Utilizing psychosocial counselor, staff and physician to assist with identification of specific Stressors or current issues interfering with healing process. Setting desired goal for each stressor or current issue identified.;Long Term Goal: Stressors or current issues are controlled or eliminated.;Short Term goal: Identification and review with participant of any Quality of Life or Depression concerns found by scoring the questionnaire.;Long Term goal: The participant improves quality of Life and PHQ9 Scores as seen by post scores and/or verbalization of changes           Quality of Life Scores:  Scores of 19 and below usually indicate a poorer quality of life in  these areas.  A difference of  2-3 points is a clinically meaningful difference.  A difference of 2-3 points in the total score of the Quality of Life Index has been associated with significant improvement in overall quality of life, self-image, physical symptoms, and general health in studies assessing change in quality of life.  PHQ-9: Recent Review Flowsheet Data    Depression screen [facility] 2/9 07/11/2019 05/24/2019 04/09/2019 10/29/2018   Decreased Interest _0 Down, Depressed, Hopeless 3  _0 PHQ - 2 Score _1 Altered sleeping _2 Tired, decreased energy _3 Change in appetite _4 Feeling bad or failure about yourself  _5 Trouble concentrating _6 Moving slowly or fidgety/restless 0 0 1 2   Suicidal thoughts 0 0 0 0   PHQ-9 Score _7 Difficult doing work/chores - Extremely dIfficult - Very difficult     Interpretation of Total Score  Total Score Depression Severity:  1-4 = Minimal depression, 5-9 = Mild depression, 10-14 = Moderate depression, 15-19 = Moderately severe depression, 20-27 = Severe depression   Psychosocial Evaluation and Intervention:  Psychosocial Evaluation - 04/03/19 1226      Psychosocial Evaluation & Interventions   Interventions Stress management education;Encouraged to exercise with the program and follow exercise prescription    Comments Selinda Eon is coming back to rehab after taking time off to help care for her aunt over the past year.  Seh has had a rough few months after losing family to suicide and murder.  She has a significant history of depression and anxiety but feels that her meds are helping to keep it manageable for her.  She really is looking forward to returning to rehab again! So much so that she turned up in person for her phone call today!  She wants to work on her breathing and lose the weight she has gained over the last few months.    Expected Outcomes Short: Attend rehab regularly and use  exerise as a stress outlet.  Long: Work on weight loss and breathing to be more functional.    Continue Psychosocial Services  Follow up required by staff           Psychosocial Re-Evaluation:  Psychosocial Re-Evaluation    [place] Name 05/13/19 1023 05/13/19 1026 05/24/19 1031 07/15/19 1723 07/22/19 1722     Psychosocial Re-Evaluation   Current issues with Current Sleep Concerns;Current Stress Concerns -- Current Sleep Concerns;Current Stress Concerns;Current Anxiety/Panic;Current Psychotropic Meds Current Sleep Concerns;Current Depression;History of Depression;Current Psychotropic Meds --   Comments [name] sees her Dr today - she still hasnt been sleeping well even using melatonin and CPAP.  They may switch to Bipap [name] is calling her Dr today neurologist) - she still hasnt been sleeping well even using melatonin and CPAP.  They may switch to Bipap Pt is on wellbutrin and lexapro to help with anxiety, but reports that it has not been helping her; pt has be on this medication for 1 year. Pt reports not seeing therapist for years. Encouraged pt to tlak to MD about medications management as well as looking into therpay. Suggested [facility] therapy as they give financial aid and flexible schedules and therapy. Encouraged pt to practice good sleep hygiene techniques as well as some basic cognitive behavioral therapy tools. Pt reports sleep apnea is affecting her sleep. Patient is going to start wearing her CPAP with 2 liters of oxygen bled in. She is hoping that her oxygen bleed in would help her sleep better and help her overall health. Her neurologist wants her to see a psychologist.She is waiting on her insurance to see if she can start talking to a mental health counselor [name] is waiting for a new machine for the oxygen and it should be in this week.  Other didnt work.  She  is waiting on authorization to see a counselor.   Expected Outcomes Short: follow up with Dr Laverta Baltimore: improve sleep quality  -- Short: follow up with MD and look into therapy options.  Long: improve sleep quality, reduce anxiety and stress Short: start talking to a mental health counselor. Long: maintain mental health and overall stress levels. Short: follow up to see a counselor Long:  maintain mental health and positive attitude   Interventions -- -- Stress management education;Encouraged to attend Pulmonary Rehabilitation for the exercise;Relaxation education Encouraged to attend Pulmonary Rehabilitation for the exercise --   Continue Psychosocial Services  -- -- Follow up required by staff Follow up required by staff --     Initial Review   Source of Stress Concerns -- -- Chronic Illness;Family;Poor Coping Skills;Unable to participate in former interests or hobbies;Unable to perform yard/household activities;Financial -- --          Psychosocial Discharge (Final Psychosocial Re-Evaluation):  Psychosocial Re-Evaluation - 07/22/19 1722      Psychosocial Re-Evaluation   Comments [name] is waiting for a new machine for the oxygen and it should be in this week.  Other didnt work.  She is waiting on authorization to see a counselor.    Expected Outcomes Short: follow up to see a counselor Long:  maintain mental health and positive attitude           Education: Education Goals: Education classes will be provided on a weekly basis, covering required topics. Participant will state understanding/return demonstration of topics presented.  Learning Barriers/Preferences:  Learning Barriers/Preferences - 04/03/19 1219      Learning Barriers/Preferences   Learning Barriers Hearing    Learning Preferences None           General Pulmonary Education Topics:  Infection Prevention: - Provides verbal and written material to individual with discussion of infection control including proper hand washing and proper equipment cleaning during exercise session.   Pulmonary Rehab from 10/11/2018 in [facility] Cardiac and  Pulmonary Rehab  Date 10/11/18  Educator AS  Instruction Review Code 1- Verbalizes Understanding      Falls Prevention: - Provides verbal and written material to individual with discussion of falls prevention and safety.   Pulmonary Rehab from 10/11/2018 in [facility] Cardiac and Pulmonary Rehab  Date 10/11/18  Educator AS  Instruction Review Code 1- Verbalizes Understanding      Chronic Lung Diseases: - Group verbal and written instruction to review updates, respiratory medications, advancements in procedures and treatments. Discuss use of supplemental oxygen including available portable oxygen systems, continuous and intermittent flow rates, concentrators, personal use and safety guidelines. Review proper use of inhaler and spacers. Provide informative websites for self-education.    Energy Conservation: - Provide group verbal and written instruction for methods to conserve energy, plan and organize activities. Instruct on pacing techniques, use of adaptive equipment and posture/positioning to relieve shortness of breath.   Triggers and Exacerbations: - Group verbal and written instruction to review types of environmental triggers and ways to prevent exacerbations. Discuss weather changes, air quality and the benefits of nasal washing. Review warning signs and symptoms to help prevent infections. Discuss techniques for effective airway clearance, coughing, and vibrations.   AED/CPR: - Group verbal and written instruction with the use of models to demonstrate the basic use of the AED with the basic ABC's of resuscitation.   Anatomy and Physiology of the Lungs: - Group verbal and written instruction with the use of models to provide basic lung anatomy and  physiology related to function, structure and complications of lung disease.   Anatomy & Physiology of the Heart: - Group verbal and written instruction and models provide basic cardiac anatomy and physiology, with the coronary  electrical and arterial systems. Review of Valvular disease and Heart Failure   Cardiac Medications: - Group verbal and written instruction to review commonly prescribed medications for heart disease. Reviews the medication, class of the drug, and side effects.   Other: -Provides group and verbal instruction on various topics (see comments)   Knowledge Questionnaire Score:  Knowledge Questionnaire Score - 04/09/19 1518      Knowledge Questionnaire Score   Pre Score 15/18            Core Components/Risk Factors/Patient Goals at Admission:  Personal Goals and Risk Factors at Admission - 04/09/19 1502      Core Components/Risk Factors/Patient Goals on Admission   Intervention Weight Management: Develop a combined nutrition and exercise program designed to reach desired caloric intake, while maintaining appropriate intake of nutrient and fiber, sodium and fats, and appropriate energy expenditure required for the weight goal.;Weight Management: Provide education and appropriate resources to help participant work on and attain dietary goals.;Weight Management/Obesity: Establish reasonable short term and long term weight goals.;Obesity: Provide education and appropriate resources to help participant work on and attain dietary goals.    Admit Weight 226 lb (102.5 kg)    Goal Weight: Short Term 215 lb (97.5 kg)    Goal Weight: Long Term 200 lb (90.7 kg)    Expected Outcomes Short Term: Continue to assess and modify interventions until short term weight is achieved;Long Term: Adherence to nutrition and physical activity/exercise program aimed toward attainment of established weight goal;Weight Loss: Understanding of general recommendations for a balanced deficit meal plan, which promotes 1-2 lb weight loss per week and includes a negative energy balance of [phone] kcal/d;Understanding recommendations for meals to include 15-35% energy as protein, 25-35% energy from fat, 35-60% energy from  carbohydrates, less than 276m of dietary cholesterol, 20-35 gm of total fiber daily;Understanding of distribution of calorie intake throughout the day with the consumption of 4-5 meals/snacks    Intervention Provide education on lifestyle modifcations including regular physical activity/exercise, weight management, moderate sodium restriction and increased consumption of fresh fruit, vegetables, and low fat dairy, alcohol moderation, and smoking cessation.;Monitor prescription use compliance.    Expected Outcomes Short Term: Continued assessment and intervention until BP is < 140/958mHG in hypertensive participants. < 130/8013mG in hypertensive participants with diabetes, heart failure or chronic kidney disease.;Long Term: Maintenance of blood pressure at goal levels.           Education:Diabetes - Individual verbal and written instruction to review signs/symptoms of diabetes, desired ranges of glucose level fasting, after meals and with exercise. Acknowledge that pre and post exercise glucose checks will be done for 3 sessions at entry of program.   Education: Know Your Numbers and Risk Factors: -Group verbal and written instruction about important numbers in your health.  Discussion of what are risk factors and how they play a role in the disease process.  Review of Cholesterol, Blood Pressure, Diabetes, and BMI and the role they play in your overall health.   Core Components/Risk Factors/Patient Goals Review:   Goals and Risk Factor Review    Row Name 05/13/19 1008 05/24/19 1118 05/27/19 1023 07/15/19 1727 07/22/19 1720     Core Components/Risk Factors/Patient Goals Review   Personal Goals Review Weight Management/Obesity;Hypertension;Lipids Weight Management/Obesity;Hypertension;Lipids;Stress;Develop more efficient  breathing techniques such as purse lipped breathing and diaphragmatic breathing and practicing self-pacing with activity.;Improve shortness of breath with ADL's Weight  Management/Obesity;Improve shortness of breath with ADL's;Lipids Weight Management/Obesity;Hypertension;Lipids;Improve shortness of breath with ADL's Weight Management/Obesity;Hypertension;Lipids;Improve shortness of breath with ADL's   Review [name] had first Covid vaccine last week.  She felt a little scratchy throat but is better now.  She is on allergy and cold medicine.  She is taking all other meds as directed.  Weight is staying about the same. With her new medication her lipid levels have been normal range and BP running 128-130s/70-low 80s. Taking medication as directed. Spoke to patient about their shortness of breath and what they can do to improve. Patient has been informed of breathing techniques when starting the program. Patient is informed to tell staff if they have had any med changes and that certain meds they are taking or not taking can be causing shortness of breath. Patient has checked her lipids recently and states that it was good. She does not have a blood pressure machine at home to check and does not want to pay for it. If she feels like it is high she checks it at [facility] or other stores. She has been out of town taking care of her sister and has not has time to exercise.She was able to walk a little in [place] when she was away. [name] is just getting back to regular attendance after being out of town.  She did walk some on vacation.  She plans to order a BP cuff and pulse ox to have at home.  She is taking all meds as directed.   Expected Outcomes Short:  continue with current plan Long:  manage risk factors Short:  continue with current plan Long:  manage risk factors Short: Attend [facility] regularly to improve shortness of breath with ADL's. Long: maintain independence with ADL's Short: obtain a blood pressure machine when she is able to. Long: Check blood pressure at home. Short: order BP cuff ond pulse ox Long: manage risk factors          Core Components/Risk Factors/Patient  Goals at Discharge (Final Review):   Goals and Risk Factor Review - 07/22/19 1720      Core Components/Risk Factors/Patient Goals Review   Personal Goals Review Weight Management/Obesity;Hypertension;Lipids;Improve shortness of breath with ADL's    Review [name] is just getting back to regular attendance after being out of town.  She did walk some on vacation.  She plans to order a BP cuff and pulse ox to have at home.  She is taking all meds as directed.    Expected Outcomes Short: order BP cuff ond pulse ox Long: manage risk factors           ITP Comments:  ITP Comments    Row Name 04/03/19 1240 04/10/19 1104 04/24/19 0553 05/22/19 0533 06/17/19 1437   ITP Comments Completed virtual orientation today.  EP evaluation is scheduled for Tuesday 3/9 at 2pm.  Documentation for diagnosis can be found in [facility] encounter 03/18/19.  She will restart on session number 8. First full day of exercise!  Patient was oriented to gym and equipment including functions, settings, policies, and procedures.  Patient's individual exercise prescription and treatment plan were reviewed.  All starting workloads were established based on the results of the 6 minute walk test done at initial orientation visit.  The plan for exercise progression was also introduced and progression will be customized based on patient's performance  and goals. 30 day chart review completed. ITP sent to Dr Zachery Dakins Medical Director, for review,changes as needed and signature. Continue with ITP if no changes requested    New to program 30 Day review completed. Medical Director review done, changes made as directed,and approval shown by signature of Market researcher. Michlle called out today for a doctor's appt.  Last attended on 4/30   Row Name 06/19/19 0535 07/17/19 0521 07/17/19 0627 08/14/19 0615 08/27/19 1351   ITP Comments 30 Day review completed. ITP review done, changes made as directed,and approval shown by signature of  Geophysical data processor. 30 Day review completed. Medical Director ITP review done, changes made as directed,and signed approval by Medical Director. 30 Day review completed. Medical Director ITP review done, changes made as directed, and signed approval by Medical Director. 30 Day review completed. Medical Director ITP review done, changes made as directed, and signed approval by Medical Director. [name] has not attended since 08/01/19   Row Name 08/29/19 1154 09/11/19 0547         ITP Comments Called to check on patient.  She has been out on vacation at the beach.  She will return from vacation on 8/4 and return to rehab on 8/9. 30 Day review completed. Medical Director ITP review done, changes made as directed, and signed approval by Medical Director.             Comments:

## 2019-09-12 ENCOUNTER — Telehealth: Payer: Self-pay | Admitting: *Deleted

## 2019-09-12 ENCOUNTER — Ambulatory Visit: Payer: BC Managed Care – PPO

## 2019-09-12 NOTE — Telephone Encounter (Signed)
Called to check on pt.  Pt was supposed to return this week but has missed both appointments.  Left message that we need to hear back from her this week or we will need to discharge her.

## 2019-09-16 ENCOUNTER — Encounter: Payer: Self-pay | Admitting: *Deleted

## 2019-09-16 ENCOUNTER — Ambulatory Visit: Payer: BC Managed Care – PPO

## 2019-09-16 DIAGNOSIS — R0609 Other forms of dyspnea: Secondary | ICD-10-CM

## 2019-09-16 NOTE — Progress Notes (Signed)
Pulmonary Individual Treatment Plan  Patient Details  Name: Samantha Greene MRN: [id number] Date of Birth: 26-Apr-1970 Referring Provider:     Pulmonary Rehab from 10/11/2018 in [facility] Cardiac and Pulmonary Rehab  Referring Provider [place]      Initial Encounter Date:    Pulmonary Rehab from 10/11/2018 in [facility] Cardiac and Pulmonary Rehab  Date 10/11/18      Visit Diagnosis: DOE (dyspnea on exertion)  Patient's Home Medications on Admission:  Current Outpatient Medications:  .  albuterol (VENTOLIN HFA) 108 (90 Base) MCG/ACT inhaler, INHALE 2 PUFFS INTO THE LUNGS EVERY 4 (FOUR) HOURS AS NEEDED FOR WHEEZING OR SHORTNESS OF BREATH., Disp: 18 g, Rfl: 2 .  aspirin 81 MG tablet, Take 81 mg by mouth daily., Disp: , Rfl:  .  baclofen (LIORESAL) 10 MG tablet, TAKE 1/2 TO 1 TABLET 1 OR 2 X /DAY AS NEEDED FOR MUSCLE SPASMS, Disp: 180 tablet, Rfl: 1 .  buPROPion (WELLBUTRIN XL) 300 MG 24 hr tablet, Take 1 tablet every Morning for Mood, Focus & Concentration, Disp: 90 tablet, Rfl: 3 .  cetirizine (ZYRTEC) 10 MG tablet, Take 10 mg by mouth at bedtime., Disp: , Rfl:  .  Cholecalciferol (VITAMIN D) 125 MCG (5000 UT) CAPS, Take 5,000 Units by mouth daily. , Disp: , Rfl:  .  escitalopram (LEXAPRO) 20 MG tablet, Take 1 tablet Daily for Mood, Disp: 90 tablet, Rfl: 3 .  Eszopiclone 3 MG TABS, Take immediately before bedtime, Disp: 30 tablet, Rfl: 3 .  fexofenadine (ALLEGRA) 180 MG tablet, Take 180 mg by mouth daily as needed for allergies or rhinitis. Alternates with zyrtec when Zyrtec seems to not be as effective, Disp: , Rfl:  .  fluocinolone (SYNALAR) 0.025 % ointment, APPLY TO AFFECTED AREA TWICE A DAY, Disp: 30 g, Rfl: 0 .  fluticasone (FLONASE) 50 MCG/ACT nasal spray, Place 2 sprays into both nostrils daily., Disp: 16 g, Rfl: 2 .  MELATONIN PO, Take 10 mg by mouth., Disp: , Rfl:  .  meloxicam (MOBIC) 15 MG tablet, Take 1/2 to 1 tablet Daily for Pain & Inflammation  (Limit to 4-5 tabs /week Tto Avoid  Kidney Damage), Disp: 90 tablet, Rfl: 3 .  montelukast (SINGULAIR) 10 MG tablet, Take 1 tablet Daily for Allergies, Disp: 90 tablet, Rfl: 0 .  Multiple Vitamin (MULTIVITAMIN) tablet, Take 1 tablet by mouth daily., Disp: , Rfl:  .  nystatin cream (MYCOSTATIN), Apply 1 application topically 2 (two) times daily. (Patient taking differently: Apply 1 application topically 2 (two) times daily as needed for dry skin. ), Disp: 30 g, Rfl: 1 .  omeprazole (PRILOSEC) 40 MG capsule, TAKE 1 CAPSULE BY MOUTH EVERY DAY, Disp: 90 capsule, Rfl: 3 .  rosuvastatin (CRESTOR) 5 MG tablet, TAKE 1 TABLET BY MOUTH EVERYDAY AT BEDTIME, Disp: 90 tablet, Rfl: 3 .  telmisartan (MICARDIS) 40 MG tablet, TAKE 1 TABLET DAILY FOR BLOOD PRESSURE, Disp: 90 tablet, Rfl: 3 .  triamcinolone ointment (KENALOG) 0.1 %, Apply 1 application topically 2 (two) times daily. (Patient taking differently: Apply 1 application topically daily as needed (rash). ), Disp: 80 g, Rfl: 1 .  vitamin C (ASCORBIC ACID) 500 MG tablet, Take 500 mg by mouth daily., Disp: , Rfl:  .  vitamin E 600 UNIT capsule, Take 600 Units by mouth daily., Disp: , Rfl:   Past Medical History: Past Medical History:  Diagnosis Date  . Anxiety   . Arthritis   . Depression    no meds  . Diaphragmatic  hernia with obstruction, without gangrene 11/07/2018  . Dyspnea    can happen anytime  . Family history of breast cancer   . Family history of colon cancer   . Family history of lung cancer   . Family history of pancreatic cancer   . Family history of prostate cancer   . GERD (gastroesophageal reflux disease)   . Hernia, diaphragmatic   . History of kidney stones   . Hyperlipidemia   . Hypertension    no meds  . Pneumonia    1 time    Tobacco Use: Social History   Tobacco Use  Smoking Status Never Smoker  Smokeless Tobacco Never Used    Labs: Recent Review Flowsheet Data    Labs for ITP Cardiac and Pulmonary Rehab Latest Ref Rng & Units 03/24/2018 09/11/2018  12/12/2018 03/18/2019 06/17/2019   Cholestrol <200 mg/dL - 226(H) 170 174 168   LDLCALC mg/dL (calc) - 132(H) 78 82 79   HDL > OR = 50 mg/dL - 62 63 69 65   Trlycerides <150 mg/dL - 188(H) 194(H) 136 143   Hemoglobin A1c <5.7 % of total Hgb - 5.6 5.4 5.6 5.5   PHART 7.35 - 7.45 7.361 - - - -   PCO2ART 32 - 48 mmHg 45.9 - - - -   HCO3 20.0 - 28.0 mmol/L 25.4 - - - -   ACIDBASEDEF 0.0 - 2.0 mmol/L - - - - -   O2SAT % 96.7 - - - -       Pulmonary Assessment Scores:  Pulmonary Assessment Scores    Row Name 04/09/19 1513         ADL UCSD   SOB Score total 62     Rest 2     Walk 4     Stairs 5     Bath 2     Dress 2     Shop 2       CAT Score   CAT Score 25       mMRC Score   mMRC Score 2            UCSD: Self-administered rating of dyspnea associated with activities of daily living (ADLs) 6-point scale (0 = "not at all" to 5 = "maximal or unable to do because of breathlessness")  Scoring Scores range from 0 to 120.  Minimally important difference is 5 units  CAT: CAT can identify the health impairment of COPD patients and is better correlated with disease progression.  CAT has a scoring range of zero to 40. The CAT score is classified into four groups of low (less than 10), medium (10 - 20), high (21-30) and very high (31-40) based on the impact level of disease on health status. A CAT score over 10 suggests significant symptoms.  A worsening CAT score could be explained by an exacerbation, poor medication adherence, poor inhaler technique, or progression of COPD or comorbid conditions.  CAT MCID is 2 points  mMRC: mMRC (Modified Medical Research Council) Dyspnea Scale is used to assess the degree of baseline functional disability in patients of respiratory disease due to dyspnea. No minimal important difference is established. A decrease in score of 1 point or greater is considered a positive change.   Pulmonary Function Assessment:   Exercise Target Goals: Exercise  Program Goal: Individual exercise prescription set using results from initial 6 min walk test and THRR while considering  patient's activity barriers and safety.   Exercise Prescription Goal: Initial exercise  prescription builds to 30-45 minutes a day of aerobic activity, 2-3 days per week.  Home exercise guidelines will be given to patient during program as part of exercise prescription that the participant will acknowledge.  Education: Aerobic Exercise & Resistance Training: - Gives group verbal and written instruction on the various components of exercise. Focuses on aerobic and resistive training programs and the benefits of this training and how to safely progress through these programs..   Education: Exercise & Equipment Safety: - Individual verbal instruction and demonstration of equipment use and safety with use of the equipment.   Pulmonary Rehab from 10/11/2018 in [facility] Cardiac and Pulmonary Rehab  Date 10/11/18  Educator AS  Instruction Review Code 1- Verbalizes Understanding      Education: Exercise Physiology & General Exercise Guidelines: - Group verbal and written instruction with models to review the exercise physiology of the cardiovascular system and associated critical values. Provides general exercise guidelines with specific guidelines to those with heart or lung disease.    Education: Flexibility, Balance, Mind/Body Relaxation: Provides group verbal/written instruction on the benefits of flexibility and balance training, including mind/body exercise modes such as yoga, pilates and tai chi.  Demonstration and skill practice provided.   Activity Barriers & Risk Stratification:  Activity Barriers & Cardiac Risk Stratification - 04/03/19 1218      Activity Barriers & Cardiac Risk Stratification   Activity Barriers Deconditioning;Muscular Weakness;Shortness of Breath;History of Falls           6 Minute Walk:  6 Minute Walk    Row Name 04/09/19 1504         6  Minute Walk   Phase Initial     Distance 1200 feet     Walk Time 6 minutes     # of Rest Breaks 0     MPH 2.27     METS 3.37     RPE 17     Perceived Dyspnea  3     VO2 Peak 11.81     Symptoms No     Resting HR 79 bpm     Resting BP 136/84     Resting Oxygen Saturation  97 %     Exercise Oxygen Saturation  during 6 min walk 94 %     Max Ex. HR 105 bpm     Max Ex. BP 164/82     2 Minute Post BP 138/86       Interval HR   1 Minute HR 92     2 Minute HR 100     3 Minute HR 103     4 Minute HR 105     5 Minute HR 104     6 Minute HR 103     2 Minute Post HR 86     Interval Heart Rate? Yes       Interval Oxygen   Interval Oxygen? Yes     Baseline Oxygen Saturation % 97 %     1 Minute Oxygen Saturation % 98 %     1 Minute Liters of Oxygen 0 L     2 Minute Oxygen Saturation % 94 %     2 Minute Liters of Oxygen 0 L     3 Minute Oxygen Saturation % 95 %     3 Minute Liters of Oxygen 0 L     4 Minute Oxygen Saturation % 95 %     4 Minute Liters of Oxygen 0 L  5 Minute Oxygen Saturation % 95 %     5 Minute Liters of Oxygen 0 L     6 Minute Oxygen Saturation % 96 %     6 Minute Liters of Oxygen 0 L     2 Minute Post Oxygen Saturation % 99 %     2 Minute Post Liters of Oxygen 0 L           Oxygen Initial Assessment:  Oxygen Initial Assessment - 04/03/19 1221      Home Oxygen   Home Oxygen Device None    Sleep Oxygen Prescription CPAP    Home Exercise Oxygen Prescription None    Home at Rest Exercise Oxygen Prescription None    Compliance with Home Oxygen Use Yes      Initial 6 min Walk   Oxygen Used None      Program Oxygen Prescription   Program Oxygen Prescription None      Intervention   Short Term Goals To learn and exhibit compliance with exercise, home and travel O2 prescription;To learn and understand importance of monitoring SPO2 with pulse oximeter and demonstrate accurate use of the pulse oximeter.;To learn and understand importance of maintaining  oxygen saturations>88%;To learn and demonstrate proper pursed lip breathing techniques or other breathing techniques.;To learn and demonstrate proper use of respiratory medications    Long  Term Goals Exhibits compliance with exercise, home and travel O2 prescription;Verbalizes importance of monitoring SPO2 with pulse oximeter and return demonstration;Maintenance of O2 saturations>88%;Exhibits proper breathing techniques, such as pursed lip breathing or other method taught during program session;Compliance with respiratory medication;Demonstrates proper use of MDI's           Oxygen Re-Evaluation:  Oxygen Re-Evaluation    Row Name 04/10/19 1107 05/24/19 1116 05/27/19 1019 07/15/19 1720 07/22/19 1734     Program Oxygen Prescription   Program Oxygen Prescription _0      Home Oxygen   Home Oxygen Device None None None Home Concentrator Home Concentrator   Sleep Oxygen Prescription CPAP CPAP CPAP Continuous;CPAP Continuous;CPAP   Liters per minute -- 6 0 2 2   Home Exercise Oxygen Prescription _1    Home at Rest Exercise Oxygen Prescription _2    Compliance with Home Oxygen Use _3      Goals/Expected Outcomes   Short Term Goals To learn and exhibit compliance with exercise, home and travel O2 prescription;To learn and understand importance of monitoring SPO2 with pulse oximeter and demonstrate accurate use of the pulse oximeter.;To learn and understand importance of maintaining oxygen saturations>88%;To learn and demonstrate proper pursed lip breathing techniques or other breathing techniques.;To learn and demonstrate proper use of respiratory medications To learn and exhibit compliance with exercise, home and travel O2 prescription;To learn and understand importance of monitoring SPO2 with pulse oximeter and demonstrate accurate use of the pulse oximeter.;To learn and understand importance of maintaining oxygen  saturations>88%;To learn and demonstrate proper pursed lip breathing techniques or other breathing techniques.;To learn and demonstrate proper use of respiratory medications To learn and exhibit compliance with exercise, home and travel O2 prescription;To learn and demonstrate proper pursed lip breathing techniques or other breathing techniques.;To learn and demonstrate proper use of respiratory medications;To learn and understand importance of monitoring SPO2 with pulse oximeter and demonstrate accurate use of the pulse oximeter. To learn and demonstrate proper use of respiratory medications;To learn and understand importance of maintaining oxygen saturations>88%;To learn and understand importance  of monitoring SPO2 with pulse oximeter and demonstrate accurate use of the pulse oximeter.;To learn and exhibit compliance with exercise, home and travel O2 prescription;To learn and demonstrate proper pursed lip breathing techniques or other breathing techniques. To learn and demonstrate proper use of respiratory medications;To learn and understand importance of maintaining oxygen saturations>88%;To learn and understand importance of monitoring SPO2 with pulse oximeter and demonstrate accurate use of the pulse oximeter.;To learn and exhibit compliance with exercise, home and travel O2 prescription;To learn and demonstrate proper pursed lip breathing techniques or other breathing techniques.   Long  Term Goals Exhibits compliance with exercise, home and travel O2 prescription;Verbalizes importance of monitoring SPO2 with pulse oximeter and return demonstration;Maintenance of O2 saturations>88%;Exhibits proper breathing techniques, such as pursed lip breathing or other method taught during program session;Compliance with respiratory medication;Demonstrates proper use of MDI's Exhibits compliance with exercise, home and travel O2 prescription;Verbalizes importance of monitoring SPO2 with pulse oximeter and return  demonstration;Maintenance of O2 saturations>88%;Exhibits proper breathing techniques, such as pursed lip breathing or other method taught during program session;Compliance with respiratory medication;Demonstrates proper use of MDI's Exhibits compliance with exercise, home and travel O2 prescription;Verbalizes importance of monitoring SPO2 with pulse oximeter and return demonstration;Exhibits proper breathing techniques, such as pursed lip breathing or other method taught during program session;Compliance with respiratory medication;Demonstrates proper use of MDI's Compliance with respiratory medication;Demonstrates proper use of MDI's;Exhibits proper breathing techniques, such as pursed lip breathing or other method taught during program session;Maintenance of O2 saturations>88%;Verbalizes importance of monitoring SPO2 with pulse oximeter and return demonstration;Exhibits compliance with exercise, home and travel O2 prescription Compliance with respiratory medication;Demonstrates proper use of MDI's;Exhibits proper breathing techniques, such as pursed lip breathing or other method taught during program session;Maintenance of O2 saturations>88%;Verbalizes importance of monitoring SPO2 with pulse oximeter and return demonstration;Exhibits compliance with exercise, home and travel O2 prescription   Comments Reviewed PLB technique with pt.  Talked about how it works and it's importance in maintaining their exercise saturations. Talking to doctor about BIPAP or diphibulator machine. Reviewed PLB and diaphramic breathing. She does have a pulse oximeter to check her oxygen saturation at home. Explained why it is important to have one. Reviewed that oxygen saturations should be 88 percent and above. Patient has her smart watch to check oxygen and heart rate. Samantha Greene is back from [place] and is now going to be on home oxygen at night, She wears her CPAP routinely and is going to bleed in oxygen into her CPAP machine.  Samantha Greene is waiting for new machine for the oxygen as the other didnt work.   Goals/Expected Outcomes Short: Become more profiecient at using PLB.   Long: Become independent at using PLB. Short: Become more profiecient at using PLB.   Long: Become independent at using PLB. Short: monitor oxygen at home with exertion. Long: maintain oxygen saturations above 88 percent independently. Short: Use CPAP machine with oxygen. Long: maintain oxygen device and CPAP o2 delivery independently. Short: Use CPAP machine with oxygen. Long: maintain oxygen device and CPAP o2 delivery independently.          Oxygen Discharge (Final Oxygen Re-Evaluation):  Oxygen Re-Evaluation - 07/22/19 1734      Program Oxygen Prescription   Program Oxygen Prescription None      Home Oxygen   Home Oxygen Device Home Concentrator    Sleep Oxygen Prescription Continuous;CPAP    Liters per minute 2    Home Exercise Oxygen Prescription None    Home at Rest Exercise Oxygen  Prescription None    Compliance with Home Oxygen Use Yes      Goals/Expected Outcomes   Short Term Goals To learn and demonstrate proper use of respiratory medications;To learn and understand importance of maintaining oxygen saturations>88%;To learn and understand importance of monitoring SPO2 with pulse oximeter and demonstrate accurate use of the pulse oximeter.;To learn and exhibit compliance with exercise, home and travel O2 prescription;To learn and demonstrate proper pursed lip breathing techniques or other breathing techniques.    Long  Term Goals Compliance with respiratory medication;Demonstrates proper use of MDI's;Exhibits proper breathing techniques, such as pursed lip breathing or other method taught during program session;Maintenance of O2 saturations>88%;Verbalizes importance of monitoring SPO2 with pulse oximeter and return demonstration;Exhibits compliance with exercise, home and travel O2 prescription    Comments Samantha Greene is waiting for new  machine for the oxygen as the other didnt work.    Goals/Expected Outcomes Short: Use CPAP machine with oxygen. Long: maintain oxygen device and CPAP o2 delivery independently.           Initial Exercise Prescription:  Initial Exercise Prescription - 04/09/19 1500      Treadmill   MPH 2.2    Grade 1.5    Minutes 15    METs 3.1      Recumbant Bike   Level 2    RPM 50    Watts 45    Minutes 15    METs 3.1      Arm Ergometer   Level 1    RPM 30    Minutes 15    METs 2.5      Elliptical   Level 1    Speed 3    Minutes 15      Intensity   THRR 40-80% of Max Heartrate 116-153    Ratings of Perceived Exertion 11-15    Perceived Dyspnea 0-4      Resistance Training   Training Prescription Yes    Weight 3 lb    Reps 10-15           Perform Capillary Blood Glucose checks as needed.  Exercise Prescription Changes:  Exercise Prescription Changes    Row Name 04/11/19 1200 05/07/19 1500 05/22/19 1100 06/03/19 1400 07/02/19 1500     Response to Exercise   Blood Pressure (Admit) 130/78 136/78 124/82 118/78 106/76   Blood Pressure (Exercise) 170/82 152/82 162/80 180/98 150/70   Blood Pressure (Exit) 116/84 132/70 126/74 132/78 122/72   Heart Rate (Admit) 72 bpm 86 bpm 83 bpm 94 bpm 88 bpm   Heart Rate (Exercise) 101 bpm 120 bpm 122 bpm 116 bpm 144 bpm   Heart Rate (Exit) 95 bpm 98 bpm 100 bpm 104 bpm 90 bpm   Oxygen Saturation (Admit) 98 % 98 % 95 % 97 % 98 %   Oxygen Saturation (Exercise) 95 % 94 % 94 % 95 % 94 %   Oxygen Saturation (Exit) 96 % 98 % 96 % 94 % 96 %   Rating of Perceived Exertion (Exercise) _0 Perceived Dyspnea (Exercise) _1 Symptoms _2    Duration Continue with 30 min of aerobic exercise without signs/symptoms of physical distress. Continue with 30 min of aerobic exercise without signs/symptoms of physical distress. Continue with 30 min of aerobic exercise without signs/symptoms of physical distress.  Continue with 30 min of aerobic exercise without signs/symptoms of physical distress. Continue with 30 min of aerobic exercise  without signs/symptoms of physical distress.   Intensity _0      Progression   Progression -- Continue to progress workloads to maintain intensity without signs/symptoms of physical distress. Continue to progress workloads to maintain intensity without signs/symptoms of physical distress. Continue to progress workloads to maintain intensity without signs/symptoms of physical distress. Continue to progress workloads to maintain intensity without signs/symptoms of physical distress.   Average METs -- 2.8 3.07 3.4 3.5     Resistance Training   Training Prescription _1    Weight 3 lb 3 lb 3 lb 3 lb 3 lb   Reps 10-15 10-15 10-15 10-15 10-15     Interval Training   Interval Training _2      Treadmill   MPH -- -- -- -- 2.9   Grade -- -- -- -- 2   Minutes -- -- -- -- 15   METs -- -- -- -- 4     Recumbant Bike   Level _3 --   RPM 50 50 -- 50 --   Watts 15 -- 30 36 --   Minutes _4 --   METs 2.47 3.39 3.31 3 --     Arm Ergometer   Level _5 --   RPM 30 30 -- 30 --   Minutes _6 --   METs 2.8 2.2 3.2 3.4 --     Elliptical   Level -- -- -- -- 1   Minutes -- -- -- -- 15   Row Name 07/17/19 1400 07/31/19 1000 08/12/19 1300         Response to Exercise   Blood Pressure (Admit) 132/76 124/80 140/82     Blood Pressure (Exercise) 188/80 188/76 144/78     Blood Pressure (Exit) 130/72 132/78 124/84     Heart Rate (Admit) 98 bpm 86 bpm 92 bpm     Heart Rate (Exercise) 121 bpm 123 bpm 122 bpm     Heart Rate (Exit) 96 bpm 99 bpm 102 bpm     Oxygen Saturation (Admit) 96 % 100 % 98 %     Oxygen Saturation (Exercise) 94 % 94 % 97 %     Oxygen Saturation (Exit) 97 % 94 % 96 %     Rating of Perceived Exertion (Exercise) _7 Perceived  Dyspnea (Exercise) _8 Symptoms none none none     Duration Continue with 30 min of aerobic exercise without signs/symptoms of physical distress. Continue with 30 min of aerobic exercise without signs/symptoms of physical distress. Continue with 30 min of aerobic exercise without signs/symptoms of physical distress.     Intensity THRR unchanged THRR unchanged THRR unchanged       Progression   Progression Continue to progress workloads to maintain intensity without signs/symptoms of physical distress. Continue to progress workloads to maintain intensity without signs/symptoms of physical distress. Continue to progress workloads to maintain intensity without signs/symptoms of physical distress.     Average METs 3.22 3.45 3.26       Resistance Training   Training Prescription Yes Yes Yes     Weight 3 lb 5 lb 5 lb     Reps 10-15 10-15 10-15       Interval Training   Interval Training No No No       Recumbant Bike  Level _0 RPM -- 60 --     Watts 32 -- 45     Minutes _1 METs 2.43 3.4 4.02       Arm Ergometer   Level _2 RPM -- 30 --     Minutes _3 METs 3.5 3.5 3.1       Home Exercise Plan   Plans to continue exercise at Home (comment)  walk Home (comment)  walk Home (comment)  walk     Frequency Add 2 additional days to program exercise sessions. Add 2 additional days to program exercise sessions. Add 2 additional days to program exercise sessions.     Initial Home Exercises Provided 05/13/19 05/13/19 05/13/19            Exercise Comments:   Exercise Goals and Review:  Exercise Goals    Row Name 04/09/19 1519             Exercise Goals   Increase Physical Activity Yes       Intervention Provide advice, education, support and counseling about physical activity/exercise needs.;Develop an individualized exercise prescription for aerobic and resistive training based on initial evaluation findings, risk stratification, comorbidities  and participant's personal goals.       Expected Outcomes Short Term: Attend rehab on a regular basis to increase amount of physical activity.;Long Term: Add in home exercise to make exercise part of routine and to increase amount of physical activity.;Long Term: Exercising regularly at least 3-5 days a week.       Increase Strength and Stamina Yes       Intervention Provide advice, education, support and counseling about physical activity/exercise needs.;Develop an individualized exercise prescription for aerobic and resistive training based on initial evaluation findings, risk stratification, comorbidities and participant's personal goals.       Expected Outcomes Short Term: Increase workloads from initial exercise prescription for resistance, speed, and METs.;Short Term: Perform resistance training exercises routinely during rehab and add in resistance training at home;Long Term: Improve cardiorespiratory fitness, muscular endurance and strength as measured by increased METs and functional capacity (6MWT)       Able to understand and use rate of perceived exertion (RPE) scale Yes       Intervention Provide education and explanation on how to use RPE scale       Expected Outcomes Short Term: Able to use RPE daily in rehab to express subjective intensity level;Long Term:  Able to use RPE to guide intensity level when exercising independently       Able to understand and use Dyspnea scale Yes       Intervention Provide education and explanation on how to use Dyspnea scale       Expected Outcomes Short Term: Able to use Dyspnea scale daily in rehab to express subjective sense of shortness of breath during exertion;Long Term: Able to use Dyspnea scale to guide intensity level when exercising independently       Knowledge and understanding of Target Heart Rate Range (THRR) Yes       Intervention Provide education and explanation of THRR including how the numbers were predicted and where they are located for  reference       Expected Outcomes Short Term: Able to state/look up THRR;Short Term: Able to use daily as guideline for intensity in rehab;Long Term: Able to use THRR to govern intensity  when exercising independently       Able to check pulse independently Yes       Intervention Provide education and demonstration on how to check pulse in carotid and radial arteries.;Review the importance of being able to check your own pulse for safety during independent exercise       Expected Outcomes Short Term: Able to explain why pulse checking is important during independent exercise;Long Term: Able to check pulse independently and accurately       Understanding of Exercise Prescription Yes       Intervention Provide education, explanation, and written materials on patient's individual exercise prescription       Expected Outcomes Short Term: Able to explain program exercise prescription;Long Term: Able to explain home exercise prescription to exercise independently              Exercise Goals Re-Evaluation :  Exercise Goals Re-Evaluation    Row Name 04/10/19 1105 04/11/19 1300 04/24/19 0933 05/07/19 1540 05/13/19 1011     Exercise Goal Re-Evaluation   Exercise Goals Review Increase Physical Activity;Increase Strength and Stamina;Understanding of Exercise Prescription;Able to understand and use Dyspnea scale;Able to understand and use rate of perceived exertion (RPE) scale;Knowledge and understanding of Target Heart Rate Range (THRR) Increase Physical Activity;Increase Strength and Stamina;Able to understand and use rate of perceived exertion (RPE) scale;Able to understand and use Dyspnea scale;Knowledge and understanding of Target Heart Rate Range (THRR);Able to check pulse independently -- Increase Physical Activity;Increase Strength and Stamina;Able to understand and use rate of perceived exertion (RPE) scale;Able to understand and use Dyspnea scale;Knowledge and understanding of Target Heart Rate Range  (THRR);Able to check pulse independently;Understanding of Exercise Prescription Increase Physical Activity;Increase Strength and Stamina;Able to understand and use rate of perceived exertion (RPE) scale;Able to understand and use Dyspnea scale;Knowledge and understanding of Target Heart Rate Range (THRR);Able to check pulse independently;Understanding of Exercise Prescription   Comments Reviewed RPE scale, THR and program prescription with pt today.  Pt voiced understanding and was given a copy of goals to take home. -- Out sick since last review. Jessaca is just returning after being ill. Wanza is using her 5 lb weights at home.  This EP will give info on [employer]   Expected Outcomes Short: Use RPE daily to regulate intensity. Long: Follow program prescription in THR. -- -- Short : build back up after being ill Long: build overall stamina Short: consider walking at the mall or water aerobics  Long:  exercise 3-5 days per week   Row Name 05/22/19 1008 05/24/19 1113 05/27/19 1016 06/03/19 1427 06/17/19 1438     Exercise Goal Re-Evaluation   Exercise Goals Review Increase Physical Activity;Increase Strength and Stamina;Understanding of Exercise Prescription Increase Physical Activity;Increase Strength and Stamina;Understanding of Exercise Prescription Increase Physical Activity;Increase Strength and Stamina;Understanding of Exercise Prescription Increase Physical Activity;Increase Strength and Stamina;Able to understand and use rate of perceived exertion (RPE) scale;Able to understand and use Dyspnea scale;Knowledge and understanding of Target Heart Rate Range (THRR);Able to check pulse independently;Understanding of Exercise Prescription --   Comments Samantha Greene is doing well in rehab.  She is excited to be back and enjoys getting in her exericse.  She is doing 2.8 mph on treadmill.  We will continue to monitor her progress. Samantha Greene is doing well in rehab.  She is excited to be back and enjoys getting  in her exericse.  5 lb weights at home 1x/week 10 minutes. Walking at home for 30 minutes 2x/week trail with hills:  breathing gets difficult, not able to talk during hills, will stop to catch breath. She is not doing well with exercise at home and would like to start going to [employer] again. Her doctor states that she does not want her to go due to covid. She is due for her second vaccine soon and will ask her doctor if she will be able to go after her vaccince. Marcelino Duster is up to level 5 on the XR.  Staff will encourage increasing weight strength training. Out since last review. Last attended on 4/30   Expected Outcomes Short: Increase workload on XR  Long: Continue to improve stamina. Short: working towards 1 hour for walking at home  Long: Continue to improve stamina. Short: ask doctor if she can go back to the gym after her vaccine. Long: Start going to the gym 2 days a week. Short: increase to 4-5 lb for strength work Long: improve overall MET level and stamina --   Row Name 07/02/19 1534 07/15/19 1742 07/17/19 1436 07/22/19 1726 07/31/19 1016     Exercise Goal Re-Evaluation   Exercise Goals Review Increase Physical Activity;Increase Strength and Stamina;Able to understand and use rate of perceived exertion (RPE) scale;Knowledge and understanding of Target Heart Rate Range (THRR) Increase Physical Activity;Increase Strength and Stamina Increase Physical Activity;Increase Strength and Stamina;Understanding of Exercise Prescription Increase Physical Activity;Increase Strength and Stamina;Understanding of Exercise Prescription Increase Physical Activity;Increase Strength and Stamina;Understanding of Exercise Prescription   Comments Marcelino Duster has switched to a different time slot that works better for her.  She has tried the ellpitical an dit si challenging for her. Staff will monitor progress. Patient is going to start going to [employer] for exercise. It will help her keep her strength up and  improve her mood. Marcelino Duster returned this week after being out for almost a month in [place] with family. She was able to return without a problem and picked right back up. WE will continue to monitor her progress. Marcelino Duster has a sore place around rib cage from movogn oxygen tanks.  She is doing seated machines and will just stretch instead of doing weights today. Marcelino Duster is progressing well with exercise and seeing small but steady gains in MET level.  Staff will monitor progress.   Expected Outcomes Short: get back to consistent attendance  Long:  increase stamina Short: start going to [employer]. Long: maintain a workout routine post [facility]. Short: Return to regular attendance Long: Continue to improve stamina. Short: Return to regular attendance Long: Continue to improve stamina. Short: exrecise at least 3 days per week Long: increase MET level   Row Name 08/12/19 1329 09/11/19 1157           Exercise Goal Re-Evaluation   Exercise Goals Review Increase Physical Activity;Increase Strength and Stamina;Understanding of Exercise Prescription --      Comments Marcelino Duster has only attended once since last review.  She would benefit from improved attendance.  We will continue to monitor her progress. Out since last review      Expected Outcomes Short: Improved attendance Long: Continue to improve stamina. --             Discharge Exercise Prescription (Final Exercise Prescription Changes):  Exercise Prescription Changes - 08/12/19 1300      Response to Exercise   Blood Pressure (Admit) 140/82    Blood Pressure (Exercise) 144/78    Blood Pressure (Exit) 124/84    Heart Rate (Admit) 92 bpm    Heart Rate (Exercise) 122  bpm    Heart Rate (Exit) 102 bpm    Oxygen Saturation (Admit) 98 %    Oxygen Saturation (Exercise) 97 %    Oxygen Saturation (Exit) 96 %    Rating of Perceived Exertion (Exercise) 14    Perceived Dyspnea (Exercise) 3    Symptoms none    Duration Continue with 30 min of  aerobic exercise without signs/symptoms of physical distress.    Intensity THRR unchanged      Progression   Progression Continue to progress workloads to maintain intensity without signs/symptoms of physical distress.    Average METs 3.26      Resistance Training   Training Prescription Yes    Weight 5 lb    Reps 10-15      Interval Training   Interval Training No      Recumbant Bike   Level 3    Watts 45    Minutes 15    METs 4.02      Arm Ergometer   Level 1    Minutes 15    METs 3.1      Home Exercise Plan   Plans to continue exercise at Home (comment)   walk   Frequency Add 2 additional days to program exercise sessions.    Initial Home Exercises Provided 05/13/19           Nutrition:  Target Goals: Understanding of nutrition guidelines, daily intake of sodium '1500mg'$ , cholesterol '200mg'$ , calories 30% from fat and 7% or less from saturated fats, daily to have 5 or more servings of fruits and vegetables.  Education: Controlling Sodium/Reading Food Labels -Group verbal and written material supporting the discussion of sodium use in heart healthy nutrition. Review and explanation with models, verbal and written materials for utilization of the food label.   Education: General Nutrition Guidelines/Fats and Fiber: -Group instruction provided by verbal, written material, models and posters to present the general guidelines for heart healthy nutrition. Gives an explanation and review of dietary fats and fiber.   Biometrics:    Nutrition Therapy Plan and Nutrition Goals:   Nutrition Assessments:   MEDIFICTS Score Key:          ?70 Need to make dietary changes          40-70 Heart Healthy Diet         ? 40 Therapeutic Level Cholesterol Diet  Nutrition Goals Re-Evaluation:  Nutrition Goals Re-Evaluation    [place] Name 06/24/19 1726 07/22/19 1727           Goals   Nutrition Goal ST: Pt wants to try hello fresh, stress and sleep management, exercise  nutrition (pre/post workout meals/snacks) LT: Weight loss 50lb (5-10lbs now) Samantha Greene is still working on goals set by RD.  She was able to keep up healthy eating while out of town.      Comment Pt discouraged that she is not losing weight. Discussed weight cycling, set point weight, health vs weight, role of stress and sleep in weight, role of lean muscle tissue and exercise MNT, empty calories such as sugar sweetened beverages. --      Expected Outcome ST: Pt wants to try hello fresh, stress and sleep management, exercise nutrition (pre/post workout meals/snacks) LT: Weight loss 50lb (5-10lbs now) Short:  continue working on actions suggested by RD Long: achieve goal weight             Nutrition Goals Discharge (Final Nutrition Goals Re-Evaluation):  Nutrition Goals Re-Evaluation - 07/22/19 1727  Goals   Nutrition Goal Samantha Greene is still working on goals set by RD.  She was able to keep up healthy eating while out of town.    Expected Outcome Short:  continue working on actions suggested by RD Long: achieve goal weight           Psychosocial: Target Goals: Acknowledge presence or absence of significant depression and/or stress, maximize coping skills, provide positive support system. Participant is able to verbalize types and ability to use techniques and skills needed for reducing stress and depression.   Education: Depression - Provides group verbal and written instruction on the correlation between heart/lung disease and depressed mood, treatment options, and the stigmas associated with seeking treatment.   Education: Sleep Hygiene -Provides group verbal and written instruction about how sleep can affect your health.  Define sleep hygiene, discuss sleep cycles and impact of sleep habits. Review good sleep hygiene tips.    Education: Stress and Anxiety: - Provides group verbal and written instruction about the health risks of elevated stress and causes of high stress.  Discuss  the correlation between heart/lung disease and anxiety and treatment options. Review healthy ways to manage with stress and anxiety.   Initial Review & Psychosocial Screening:  Initial Psych Review & Screening - 04/03/19 1219      Initial Review   Current issues with Current Psychotropic Meds;Current Sleep Concerns;Current Depression;Current Stress Concerns    Source of Stress Concerns Chronic Illness;Family;Poor Coping Skills;Unable to participate in former interests or hobbies;Unable to perform yard/household activities    Comments She stresses about many things.  She has been caring for her aunt over the past year.  She has lost several family members to suicide and murder. She wants to breathe better to be able to do more at home.      Family Dynamics   Good Support System? Yes   Husband, aunt, son   Comments Recent loss of family and friends (4 in the last two months)      Barriers   Psychosocial barriers to participate in program The patient should benefit from training in stress management and relaxation.;Psychosocial barriers identified (see note)      Screening Interventions   Interventions Encouraged to exercise;To provide support and resources with identified psychosocial needs;Provide feedback about the scores to participant    Expected Outcomes Short Term goal: Utilizing psychosocial counselor, staff and physician to assist with identification of specific Stressors or current issues interfering with healing process. Setting desired goal for each stressor or current issue identified.;Long Term Goal: Stressors or current issues are controlled or eliminated.;Short Term goal: Identification and review with participant of any Quality of Life or Depression concerns found by scoring the questionnaire.;Long Term goal: The participant improves quality of Life and PHQ9 Scores as seen by post scores and/or verbalization of changes           Quality of Life Scores:  Scores of 19 and  below usually indicate a poorer quality of life in these areas.  A difference of  2-3 points is a clinically meaningful difference.  A difference of 2-3 points in the total score of the Quality of Life Index has been associated with significant improvement in overall quality of life, self-image, physical symptoms, and general health in studies assessing change in quality of life.  PHQ-9: Recent Review Flowsheet Data    Depression screen [facility] 2/9 07/11/2019 05/24/2019 04/09/2019 10/29/2018   Decreased Interest '3 2 3 3   '$ Down, Depressed, Hopeless 3  $'2 3 3   'Z$ PHQ - 2 Score '6 4 6 6   '$ Altered sleeping '3 3 3 1   '$ Tired, decreased energy '3 3 3 3   '$ Change in appetite '3 3 3 3   '$ Feeling bad or failure about yourself  '3 2 2 3   '$ Trouble concentrating '3 3 3 3   '$ Moving slowly or fidgety/restless 0 0 1 2   Suicidal thoughts 0 0 0 0   PHQ-9 Score '21 18 21 21   '$ Difficult doing work/chores - Extremely dIfficult - Very difficult     Interpretation of Total Score  Total Score Depression Severity:  1-4 = Minimal depression, 5-9 = Mild depression, 10-14 = Moderate depression, 15-19 = Moderately severe depression, 20-27 = Severe depression   Psychosocial Evaluation and Intervention:  Psychosocial Evaluation - 04/03/19 1226      Psychosocial Evaluation & Interventions   Interventions Stress management education;Encouraged to exercise with the program and follow exercise prescription    Comments Selinda Eon is coming back to rehab after taking time off to help care for her aunt over the past year.  Seh has had a rough few months after losing family to suicide and murder.  She has a significant history of depression and anxiety but feels that her meds are helping to keep it manageable for her.  She really is looking forward to returning to rehab again! So much so that she turned up in person for her phone call today!  She wants to work on her breathing and lose the weight she has gained over the last few months.    Expected  Outcomes Short: Attend rehab regularly and use exerise as a stress outlet.  Long: Work on weight loss and breathing to be more functional.    Continue Psychosocial Services  Follow up required by staff           Psychosocial Re-Evaluation:  Psychosocial Re-Evaluation    [place] Name 05/13/19 1023 05/13/19 1026 05/24/19 1031 07/15/19 1723 07/22/19 1722     Psychosocial Re-Evaluation   Current issues with Current Sleep Concerns;Current Stress Concerns -- Current Sleep Concerns;Current Stress Concerns;Current Anxiety/Panic;Current Psychotropic Meds Current Sleep Concerns;Current Depression;History of Depression;Current Psychotropic Meds --   Comments Samantha Greene sees her Dr today - she still hasnt been sleeping well even using melatonin and CPAP.  They may switch to Bipap Samantha Greene is calling her Dr today neurologist) - she still hasnt been sleeping well even using melatonin and CPAP.  They may switch to Bipap Pt is on wellbutrin and lexapro to help with anxiety, but reports that it has not been helping her; pt has be on this medication for 1 year. Pt reports not seeing therapist for years. Encouraged pt to tlak to MD about medications management as well as looking into therpay. Suggested [facility] therapy as they give financial aid and flexible schedules and therapy. Encouraged pt to practice good sleep hygiene techniques as well as some basic cognitive behavioral therapy tools. Pt reports sleep apnea is affecting her sleep. Patient is going to start wearing her CPAP with 2 liters of oxygen bled in. She is hoping that her oxygen bleed in would help her sleep better and help her overall health. Her neurologist wants her to see a psychologist.She is waiting on her insurance to see if she can start talking to a mental health counselor Samantha Greene is waiting for a new machine for the oxygen and it should be in this week.  Other didnt work.  She  is waiting on authorization to see a counselor.   Expected Outcomes Short:  follow up with Dr Laverta Baltimore: improve sleep quality -- Short: follow up with MD and look into therapy options.  Long: improve sleep quality, reduce anxiety and stress Short: start talking to a mental health counselor. Long: maintain mental health and overall stress levels. Short: follow up to see a counselor Long:  maintain mental health and positive attitude   Interventions -- -- Stress management education;Encouraged to attend Pulmonary Rehabilitation for the exercise;Relaxation education Encouraged to attend Pulmonary Rehabilitation for the exercise --   Continue Psychosocial Services  -- -- Follow up required by staff Follow up required by staff --     Initial Review   Source of Stress Concerns -- -- Chronic Illness;Family;Poor Coping Skills;Unable to participate in former interests or hobbies;Unable to perform yard/household activities;Financial -- --          Psychosocial Discharge (Final Psychosocial Re-Evaluation):  Psychosocial Re-Evaluation - 07/22/19 1722      Psychosocial Re-Evaluation   Comments Samantha Greene is waiting for a new machine for the oxygen and it should be in this week.  Other didnt work.  She is waiting on authorization to see a counselor.    Expected Outcomes Short: follow up to see a counselor Long:  maintain mental health and positive attitude           Education: Education Goals: Education classes will be provided on a weekly basis, covering required topics. Participant will state understanding/return demonstration of topics presented.  Learning Barriers/Preferences:  Learning Barriers/Preferences - 04/03/19 1219      Learning Barriers/Preferences   Learning Barriers Hearing    Learning Preferences None           General Pulmonary Education Topics:  Infection Prevention: - Provides verbal and written material to individual with discussion of infection control including proper hand washing and proper equipment cleaning during exercise session.   Pulmonary  Rehab from 10/11/2018 in [facility]  Date 10/11/18  Educator AS  Instruction Review Code 1- Verbalizes Understanding      Falls Prevention: - Provides verbal and written material to individual with discussion of falls prevention and safety.   Pulmonary Rehab from 10/11/2018 in [facility] Cardiac and Pulmonary Rehab  Date 10/11/18  Educator AS  Instruction Review Code 1- Verbalizes Understanding      Chronic Lung Diseases: - Group verbal and written instruction to review updates, respiratory medications, advancements in procedures and treatments. Discuss use of supplemental oxygen including available portable oxygen systems, continuous and intermittent flow rates, concentrators, personal use and safety guidelines. Review proper use of inhaler and spacers. Provide informative websites for self-education.    Energy Conservation: - Provide group verbal and written instruction for methods to conserve energy, plan and organize activities. Instruct on pacing techniques, use of adaptive equipment and posture/positioning to relieve shortness of breath.   Triggers and Exacerbations: - Group verbal and written instruction to review types of environmental triggers and ways to prevent exacerbations. Discuss weather changes, air quality and the benefits of nasal washing. Review warning signs and symptoms to help prevent infections. Discuss techniques for effective airway clearance, coughing, and vibrations.   AED/CPR: - Group verbal and written instruction with the use of models to demonstrate the basic use of the AED with the basic ABC's of resuscitation.   Anatomy and Physiology of the Lungs: - Group verbal and written instruction with the use of models to provide basic lung anatomy and  physiology related to function, structure and complications of lung disease.   Anatomy & Physiology of the Heart: - Group verbal and written instruction and models provide basic cardiac anatomy  and physiology, with the coronary electrical and arterial systems. Review of Valvular disease and Heart Failure   Cardiac Medications: - Group verbal and written instruction to review commonly prescribed medications for heart disease. Reviews the medication, class of the drug, and side effects.   Other: -Provides group and verbal instruction on various topics (see comments)   Knowledge Questionnaire Score:  Knowledge Questionnaire Score - 04/09/19 1518      Knowledge Questionnaire Score   Pre Score 15/18            Core Components/Risk Factors/Patient Goals at Admission:  Personal Goals and Risk Factors at Admission - 04/09/19 1502      Core Components/Risk Factors/Patient Goals on Admission   Intervention Weight Management: Develop a combined nutrition and exercise program designed to reach desired caloric intake, while maintaining appropriate intake of nutrient and fiber, sodium and fats, and appropriate energy expenditure required for the weight goal.;Weight Management: Provide education and appropriate resources to help participant work on and attain dietary goals.;Weight Management/Obesity: Establish reasonable short term and long term weight goals.;Obesity: Provide education and appropriate resources to help participant work on and attain dietary goals.    Admit Weight 226 lb (102.5 kg)    Goal Weight: Short Term 215 lb (97.5 kg)    Goal Weight: Long Term 200 lb (90.7 kg)    Expected Outcomes Short Term: Continue to assess and modify interventions until short term weight is achieved;Long Term: Adherence to nutrition and physical activity/exercise program aimed toward attainment of established weight goal;Weight Loss: Understanding of general recommendations for a balanced deficit meal plan, which promotes 1-2 lb weight loss per week and includes a negative energy balance of [id number] kcal/d;Understanding recommendations for meals to include 15-35% energy as protein, 25-35% energy  from fat, 35-60% energy from carbohydrates, less than '200mg'$  of dietary cholesterol, 20-35 gm of total fiber daily;Understanding of distribution of calorie intake throughout the day with the consumption of 4-5 meals/snacks    Intervention Provide education on lifestyle modifcations including regular physical activity/exercise, weight management, moderate sodium restriction and increased consumption of fresh fruit, vegetables, and low fat dairy, alcohol moderation, and smoking cessation.;Monitor prescription use compliance.    Expected Outcomes Short Term: Continued assessment and intervention until BP is < 140/25m HG in hypertensive participants. < 130/853mHG in hypertensive participants with diabetes, heart failure or chronic kidney disease.;Long Term: Maintenance of blood pressure at goal levels.           Education:Diabetes - Individual verbal and written instruction to review signs/symptoms of diabetes, desired ranges of glucose level fasting, after meals and with exercise. Acknowledge that pre and post exercise glucose checks will be done for 3 sessions at entry of program.   Education: Know Your Numbers and Risk Factors: -Group verbal and written instruction about important numbers in your health.  Discussion of what are risk factors and how they play a role in the disease process.  Review of Cholesterol, Blood Pressure, Diabetes, and BMI and the role they play in your overall health.   Core Components/Risk Factors/Patient Goals Review:   Goals and Risk Factor Review    Row Name 05/13/19 1008 05/24/19 1118 05/27/19 1023 07/15/19 1727 07/22/19 1720     Core Components/Risk Factors/Patient Goals Review   Personal Goals Review Weight Management/Obesity;Hypertension;Lipids Weight Management/Obesity;Hypertension;Lipids;Stress;Develop more efficient  breathing techniques such as purse lipped breathing and diaphragmatic breathing and practicing self-pacing with activity.;Improve shortness of  breath with ADL's Weight Management/Obesity;Improve shortness of breath with ADL's;Lipids Weight Management/Obesity;Hypertension;Lipids;Improve shortness of breath with ADL's Weight Management/Obesity;Hypertension;Lipids;Improve shortness of breath with ADL's   Review Marcelino Duster had first Covid vaccine last week.  She felt a little scratchy throat but is better now.  She is on allergy and cold medicine.  She is taking all other meds as directed.  Weight is staying about the same. With her new medication her lipid levels have been normal range and BP running 128-130s/70-low 80s. Taking medication as directed. Spoke to patient about their shortness of breath and what they can do to improve. Patient has been informed of breathing techniques when starting the program. Patient is informed to tell staff if they have had any med changes and that certain meds they are taking or not taking can be causing shortness of breath. Patient has checked her lipids recently and states that it was good. She does not have a blood pressure machine at home to check and does not want to pay for it. If she feels like it is high she checks it at [facility] or other stores. She has been out of town taking care of her sister and has not has time to exercise.She was able to walk a little in [place] when she was away. Marcelino Duster is just getting back to regular attendance after being out of town.  She did walk some on vacation.  She plans to order a BP cuff and pulse ox to have at home.  She is taking all meds as directed.   Expected Outcomes Short:  continue with current plan Long:  manage risk factors Short:  continue with current plan Long:  manage risk factors Short: Attend [facility] regularly to improve shortness of breath with ADL's. Long: maintain independence with ADL's Short: obtain a blood pressure machine when she is able to. Long: Check blood pressure at home. Short: order BP cuff ond pulse ox Long: manage risk factors          Core  Components/Risk Factors/Patient Goals at Discharge (Final Review):   Goals and Risk Factor Review - 07/22/19 1720      Core Components/Risk Factors/Patient Goals Review   Personal Goals Review Weight Management/Obesity;Hypertension;Lipids;Improve shortness of breath with ADL's    Review Marcelino Duster is just getting back to regular attendance after being out of town.  She did walk some on vacation.  She plans to order a BP cuff and pulse ox to have at home.  She is taking all meds as directed.    Expected Outcomes Short: order BP cuff ond pulse ox Long: manage risk factors           ITP Comments:  ITP Comments    Row Name 04/03/19 1240 04/10/19 1104 04/24/19 0553 05/22/19 0533 06/17/19 1437   ITP Comments Completed virtual orientation today.  EP evaluation is scheduled for Tuesday 3/9 at 2pm.  Documentation for diagnosis can be found in [facility] encounter 03/18/19.  She will restart on session number 8. First full day of exercise!  Patient was oriented to gym and equipment including functions, settings, policies, and procedures.  Patient's individual exercise prescription and treatment plan were reviewed.  All starting workloads were established based on the results of the 6 minute walk test done at initial orientation visit.  The plan for exercise progression was also introduced and progression will be customized based on patient's performance  and goals. 30 day chart review completed. ITP sent to Dr Zachery Dakins Medical Director, for review,changes as needed and signature. Continue with ITP if no changes requested    New to program 30 Day review completed. Medical Director review done, changes made as directed,and approval shown by signature of Market researcher. Michlle called out today for a doctor's appt.  Last attended on 4/30   Row Name 06/19/19 0535 07/17/19 0521 07/17/19 0627 08/14/19 0615 08/27/19 1351   ITP Comments 30 Day review completed. ITP review done, changes made as directed,and approval  shown by signature of  Scientist, research (life sciences). 30 Day review completed. Medical Director ITP review done, changes made as directed,and signed approval by Medical Director. 30 Day review completed. Medical Director ITP review done, changes made as directed, and signed approval by Medical Director. 30 Day review completed. Medical Director ITP review done, changes made as directed, and signed approval by Medical Director. Samantha Greene has not attended since 08/01/19   Row Name 08/29/19 1154 09/11/19 0547 09/11/19 1157 09/12/19 0911 09/16/19 0729   ITP Comments Called to check on patient.  She has been out on vacation at the beach.  She will return from vacation on 8/4 and return to rehab on 8/9. 30 Day review completed. Medical Director ITP review done, changes made as directed, and signed approval by Medical Director. Out since last review.  Was supposed to return on 8/9 per last phone call.  Will call tomorrow if patient does not come today. Called to check on pt.  Pt was supposed to return this week but has missed both appointments.  Left message that we need to hear back from her this week or we will need to discharge her. Samantha Greene called to ask to be discharged. She has an upcoming surgery and lots going on right now.          Comments: Discharge ITP

## 2019-09-16 NOTE — Progress Notes (Signed)
Discharge Progress Report  Patient Details  Name: DELILAH MULGREW MRN: [id number] Date of Birth: [birth year] Referring Provider:     Pulmonary Rehab from 10/11/2018 in [facility]  Referring Provider [place]       Number of Visits: 28/36  Reason for Discharge:  Early Exit:  upcoming surgery, lots going on  Smoking History:  Social History   Tobacco Use  Smoking Status Never Smoker  Smokeless Tobacco Never Used    Diagnosis:  DOE (dyspnea on exertion)  ADL UCSD:  Pulmonary Assessment Scores    Row Name 04/09/19 1513         ADL UCSD   SOB Score total 62     Rest 2     Walk 4     Stairs 5     Bath 2     Dress 2     Shop 2       CAT Score   CAT Score 25       mMRC Score   mMRC Score 2            Initial Exercise Prescription:  Initial Exercise Prescription - 04/09/19 1500      Treadmill   MPH 2.2    Grade 1.5    Minutes 15    METs 3.1      Recumbant Bike   Level 2    RPM 50    Watts 45    Minutes 15    METs 3.1      Arm Ergometer   Level 1    RPM 30    Minutes 15    METs 2.5      Elliptical   Level 1    Speed 3    Minutes 15      Intensity   THRR 40-80% of Max Heartrate 116-153    Ratings of Perceived Exertion 11-15    Perceived Dyspnea 0-4      Resistance Training   Training Prescription Yes    Weight 3 lb    Reps 10-15           Discharge Exercise Prescription (Final Exercise Prescription Changes):  Exercise Prescription Changes - 08/12/19 1300      Response to Exercise   Blood Pressure (Admit) 140/82    Blood Pressure (Exercise) 144/78    Blood Pressure (Exit) 124/84    Heart Rate (Admit) 92 bpm    Heart Rate (Exercise) 122 bpm    Heart Rate (Exit) 102 bpm    Oxygen Saturation (Admit) 98 %    Oxygen Saturation (Exercise) 97 %    Oxygen Saturation (Exit) 96 %    Rating of Perceived Exertion (Exercise) 14    Perceived Dyspnea (Exercise) 3    Symptoms none    Duration Continue with 30 min  of aerobic exercise without signs/symptoms of physical distress.    Intensity THRR unchanged      Progression   Progression Continue to progress workloads to maintain intensity without signs/symptoms of physical distress.    Average METs 3.26      Resistance Training   Training Prescription Yes    Weight 5 lb    Reps 10-15      Interval Training   Interval Training No      Recumbant Bike   Level 3    Watts 45    Minutes 15    METs 4.02      Arm Ergometer   Level 1  Minutes 15    METs 3.1      Home Exercise Plan   Plans to continue exercise at Home (comment)   walk   Frequency Add 2 additional days to program exercise sessions.    Initial Home Exercises Provided 05/13/19           Functional Capacity:  6 Minute Walk    Row Name 04/09/19 1504         6 Minute Walk   Phase Initial     Distance 1200 feet     Walk Time 6 minutes     # of Rest Breaks 0     MPH 2.27     METS 3.37     RPE 17     Perceived Dyspnea  3     VO2 Peak 11.81     Symptoms No     Resting HR 79 bpm     Resting BP 136/84     Resting Oxygen Saturation  97 %     Exercise Oxygen Saturation  during 6 min walk 94 %     Max Ex. HR 105 bpm     Max Ex. BP 164/82     2 Minute Post BP 138/86       Interval HR   1 Minute HR 92     2 Minute HR 100     3 Minute HR 103     4 Minute HR 105     5 Minute HR 104     6 Minute HR 103     2 Minute Post HR 86     Interval Heart Rate? Yes       Interval Oxygen   Interval Oxygen? Yes     Baseline Oxygen Saturation % 97 %     1 Minute Oxygen Saturation % 98 %     1 Minute Liters of Oxygen 0 L     2 Minute Oxygen Saturation % 94 %     2 Minute Liters of Oxygen 0 L     3 Minute Oxygen Saturation % 95 %     3 Minute Liters of Oxygen 0 L     4 Minute Oxygen Saturation % 95 %     4 Minute Liters of Oxygen 0 L     5 Minute Oxygen Saturation % 95 %     5 Minute Liters of Oxygen 0 L     6 Minute Oxygen Saturation % 96 %     6 Minute Liters of Oxygen  0 L     2 Minute Post Oxygen Saturation % 99 %     2 Minute Post Liters of Oxygen 0 L            Psychological, QOL, Others - Outcomes: PHQ 2/9: Depression screen [facility] 2/9 07/11/2019 05/24/2019 04/09/2019 10/29/2018  Decreased Interest 3 2 3 3   Down, Depressed, Hopeless 3 2 3 3   PHQ - 2 Score 6 4 6 6   Altered sleeping 3 3 3 1   Tired, decreased energy 3 3 3 3   Change in appetite 3 3 3 3   Feeling bad or failure about yourself  3 2 2 3   Trouble concentrating 3 3 3 3   Moving slowly or fidgety/restless 0 0 1 2  Suicidal thoughts 0 0 0 0  PHQ-9 Score 21 18 21 21   Difficult doing work/chores - Extremely dIfficult - Very difficult  Some recent data might be hidden

## 2019-09-16 NOTE — Progress Notes (Signed)
Samantha Greene called to ask to be discharged. She has an upcoming surgery and lots going on right now.

## 2019-09-18 ENCOUNTER — Ambulatory Visit: Payer: BC Managed Care – PPO

## 2019-09-19 ENCOUNTER — Telehealth: Payer: Self-pay

## 2019-09-19 ENCOUNTER — Ambulatory Visit: Payer: BC Managed Care – PPO

## 2019-09-19 NOTE — Telephone Encounter (Signed)
Patient called and is ready to schedule her outpatient surgery. We discussed her ins benefits and her estimated [facility] surgery prepayment amount due by one week prior to surgery. I will send financial letter.  I scheduled her surgery at [facility] for 10/18/19 at 11:30am.  Pre op appt will be scheduled with Dr. Mackey Birchwood as well.  Discussed need for Covid screen pre op and appt was scheduled accordingly.  Reviewed quarantine protocol post Covid test.  Will mail her a handout and a map with her date/time on it.

## 2019-09-19 NOTE — Telephone Encounter (Signed)
I called patient on 09/10/19 and left message and called her again today and left message in voice mail to call me about scheduling.

## 2019-09-21 DIAGNOSIS — G4733 Obstructive sleep apnea (adult) (pediatric): Secondary | ICD-10-CM | POA: Diagnosis not present

## 2019-09-22 NOTE — Progress Notes (Deleted)
Follow up  Assessment and Plan:  Nipple anomaly -     doxycycline (VIBRAMYCIN) 100 MG capsule; Take 1 capsule twice daily with food -     MM DIAG BREAST TOMO UNI RIGHT; Future -     US BREAST LTD UNI RIGHT INC AXILLA; Future Want to treat the abscess first however with nipple changes will get diagnositic MGM and patient is due for screening MGM  Essential hypertension - continue medications, DASH diet, exercise and monitor at home. Call if greater than 130/80.  -     CBC with Differential/Platelet -     COMPLETE METABOLIC PANEL WITH GFR -     TSH  Mixed hyperlipidemia -     Lipid panel check lipids decrease fatty foods increase activity.   Morbid obesity (HCC) - follow up 3 months for progress monitoring - increase veggies, decrease carbs - long discussion about weight loss, diet, and exercise  Psoriasis, guttate Monitor  Vitamin D deficiency -     VITAMIN D 25 Hydroxy (Vit-D Deficiency, Fractures)  OSA on CPAP Continue to follow up Dr. Marylou Flesher- trying to get O2 added for OSA  Mild episode of recurrent major depressive disorder (HCC) Continue lexapro  Medication management -     Magnesium  Abnormal glucose -     Hemoglobin A1c  Dyspnea on exertion -     AMB referral to pulmonary rehabilitation -     albuterol (VENTOLIN HFA) 108 (90 Base) MCG/ACT inhaler; Inhale 2 puffs into the lungs every 4 (four) hours as needed for wheezing or shortness of breath. - continue follow up pulmonary, go back to pulmonary rehab - continue to try to weight loss   Discussed med's effects and SE's. Screening labs and tests as requested with regular follow-up as recommended.  HPI  49 y.o. female  presents for follow up for obestiy, HTN, SOB, OSA, chol and vitamin D def  Woke up with erythematous warm nodule on right upper chest/breast, no streaking, no fever, no chills. Has had some clear drainage from it.  Last MGM was 2017. She got last COVID vaccine April 12. She states her  right nipple has been dry and sore and is changing in shape x 3 weeks, has had possible discharge.   BMI is There is no height or weight on file to calculate BMI., she is working on diet and exercise. Started on hello fresh.  Wt Readings from Last 3 Encounters:  09/03/19 226 lb (102.5 kg)  07/11/19 225 lb (102.1 kg)  06/20/19 225 lb 9.6 oz (102.3 kg)   She is s/p VATS 03/23/2018, was still having SOB with exertion or bening over and some right sided pain with bending over, she is following with Dr. Sherene Sires. She has not been doing pulmonary rehab due to being sick, will start back Wednesday.   She is on lexapro 20 mg and wellbutrin 300mg .    And had a sleep study that showed severe OSA with AHI 37.5/h and possible central sleep apnea characteristics.  and is now on a CPAP x Nov, uses [employer]. Has still been very tired during the day, 2 hour naps, has followed with Dr. Dec, will get O2 with CPAP.   Her blood pressure has been controlled at home, today their BP is  .  She does workout. She denies chest pain, shortness of breath, dizziness.   She is on cholesterol medication and denies myalgias. Her cholesterol is at goal. The cholesterol last visit was:  Lab Results  Component Value Date   CHOL 168 06/17/2019   HDL 65 06/17/2019   LDLCALC 79 06/17/2019   TRIG 143 06/17/2019   CHOLHDL 2.6 06/17/2019  . She has been working on diet and exercise for prediabetes, she is on bASA, she is on ACE/ARB and denies foot ulcerations, hyperglycemia, hypoglycemia , increased appetite, nausea, paresthesia of the feet, polydipsia, polyuria, visual disturbances, vomiting and weight loss. Last A1C in the office was:  Lab Results  Component Value Date   HGBA1C 5.5 06/17/2019   Patient is on Vitamin D supplement, she is on 5000 daily.   Lab Results  Component Value Date   VD25OH 47 06/17/2019     She has recurrent sinus injections, going to call Dr. Ezzard Standing to try get nasal surgery- has been 3 years.    Current Medications:    Current Outpatient Medications (Cardiovascular):  .  rosuvastatin (CRESTOR) 5 MG tablet, TAKE 1 TABLET BY MOUTH EVERYDAY AT BEDTIME .  telmisartan (MICARDIS) 40 MG tablet, TAKE 1 TABLET DAILY FOR BLOOD PRESSURE  Current Outpatient Medications (Respiratory):  .  albuterol (VENTOLIN HFA) 108 (90 Base) MCG/ACT inhaler, INHALE 2 PUFFS INTO THE LUNGS EVERY 4 (FOUR) HOURS AS NEEDED FOR WHEEZING OR SHORTNESS OF BREATH. .  cetirizine (ZYRTEC) 10 MG tablet, Take 10 mg by mouth at bedtime. .  fexofenadine (ALLEGRA) 180 MG tablet, Take 180 mg by mouth daily as needed for allergies or rhinitis. Alternates with zyrtec when Zyrtec seems to not be as effective .  fluticasone (FLONASE) 50 MCG/ACT nasal spray, Place 2 sprays into both nostrils daily. .  montelukast (SINGULAIR) 10 MG tablet, Take 1 tablet Daily for Allergies  Current Outpatient Medications (Analgesics):  .  aspirin 81 MG tablet, Take 81 mg by mouth daily. .  meloxicam (MOBIC) 15 MG tablet, Take 1/2 to 1 tablet Daily for Pain & Inflammation  (Limit to 4-5 tabs /week Tto Avoid Kidney Damage)   Current Outpatient Medications (Other):  .  baclofen (LIORESAL) 10 MG tablet, TAKE 1/2 TO 1 TABLET 1 OR 2 X /DAY AS NEEDED FOR MUSCLE SPASMS .  buPROPion (WELLBUTRIN XL) 300 MG 24 hr tablet, Take 1 tablet every Morning for Mood, Focus & Concentration .  Cholecalciferol (VITAMIN D) 125 MCG (5000 UT) CAPS, Take 5,000 Units by mouth daily.  Marland Kitchen  escitalopram (LEXAPRO) 20 MG tablet, Take 1 tablet Daily for Mood .  Eszopiclone 3 MG TABS, Take immediately before bedtime .  fluocinolone (SYNALAR) 0.025 % ointment, APPLY TO AFFECTED AREA TWICE A DAY .  MELATONIN PO, Take 10 mg by mouth. .  Multiple Vitamin (MULTIVITAMIN) tablet, Take 1 tablet by mouth daily. Marland Kitchen  nystatin cream (MYCOSTATIN), Apply 1 application topically 2 (two) times daily. (Patient taking differently: Apply 1 application topically 2 (two) times daily as needed for dry  skin. ) .  omeprazole (PRILOSEC) 40 MG capsule, TAKE 1 CAPSULE BY MOUTH EVERY DAY .  triamcinolone ointment (KENALOG) 0.1 %, Apply 1 application topically 2 (two) times daily. (Patient taking differently: Apply 1 application topically daily as needed (rash). ) .  vitamin C (ASCORBIC ACID) 500 MG tablet, Take 500 mg by mouth daily. .  vitamin E 600 UNIT capsule, Take 600 Units by mouth daily.  Medical History:  Past Medical History:  Diagnosis Date  . Anxiety   . Arthritis   . Depression    no meds  . Diaphragmatic hernia with obstruction, without gangrene 11/07/2018  . Dyspnea    can happen anytime  .  Family history of breast cancer   . Family history of colon cancer   . Family history of lung cancer   . Family history of pancreatic cancer   . Family history of prostate cancer   . GERD (gastroesophageal reflux disease)   . Hernia, diaphragmatic   . History of kidney stones   . Hyperlipidemia   . Hypertension    no meds  . Pneumonia    1 time   Allergies Allergies  Allergen Reactions  . Doxycycline Shortness Of Breath and Anxiety  . Erythromycin Nausea Only and Other (See Comments)    Stomach cramping (can take Z Pak)  . Flexeril [Cyclobenzaprine Hcl] Other (See Comments)    "don't like the way I feel"  . Metformin And Related Other (See Comments)    GI Upset    SURGICAL HISTORY She  has a past surgical history that includes Dilation and evacuation; Dilation and curettage of uterus; Video assisted thoracoscopy (vats)/thorocotomy (Right, 03/23/2018); epigastric hernia repair (N/A, 03/23/2018); and IR THORACENTESIS ASP PLEURAL SPACE W/IMG GUIDE (04/19/2018).   FAMILY HISTORY Her family history includes Breast cancer in her cousin and maternal aunt; Breast cancer (age of onset: 28) in her mother; Cervical cancer in her paternal aunt; Colon cancer in her maternal aunt, maternal aunt, maternal uncle, and maternal uncle; Colon polyps in her maternal aunt and mother; Congestive  Heart Failure in her mother; Diabetes in her brother, maternal aunt, maternal uncle, and mother; Heart disease in her maternal aunt, maternal grandmother, and maternal uncle; Hemochromatosis in her paternal uncle; Hyperlipidemia in her brother, maternal aunt, maternal uncle, and mother; Hypertension in her brother, maternal aunt, maternal uncle, and mother; Kidney disease in her mother; Lung cancer in her mother; Ovarian cancer in her paternal aunt; Pancreatic cancer in her maternal aunt; Polycystic ovary syndrome in her paternal aunt; Prostate cancer in her cousin, cousin, and maternal uncle; Stroke in her maternal aunt and maternal uncle; Throat cancer in her maternal aunt.   SOCIAL HISTORY She  reports that she has never smoked. She has never used smokeless tobacco. She reports current alcohol use. She reports that she does not use drugs.  Review of Systems: See HPI  Physical Exam: Estimated body mass index is 42.7 kg/m as calculated from the following:   Height as of 09/03/19: 5\' 1"  (1.549 m).   Weight as of 09/03/19: 226 lb (102.5 kg). There were no vitals taken for this visit.  General Appearance: Well nourished well developed, in no apparent distress.  Eyes: PERRLA, EOMs, conjunctiva no swelling or erythema ENT/Mouth: Ear canals normal without obstruction, swelling, erythema, or discharge.  TMs normal bilaterally with no erythema, bulging, retraction, or loss of landmark.  Oropharynx moist and clear with no exudate, erythema, or swelling.  Neck: Supple, thyroid normal. No bruits.  No cervical adenopathy Respiratory: Respiratory effort normal, Breath sounds clear A&P without wheeze, rhonchi, rales.   Cardio: RRR without murmurs, rubs or gallops. Brisk peripheral pulses without edema.  Chest: symmetric, with normal excursions Breasts: Breasts: left breast normal without mass, skin or nipple changes or axillary nodes, right breast with 3x2 cm tender warm, erythematous area on right medial  superior breast, no fluctuance. Her right nipple is scaly, slightly erythematous without discharge. No palpable nodules.  Abdomen: Soft, nontender, no guarding, rebound, hernias, masses, or organomegaly.  Lymphatics: Non tender without lymphadenopathy.  Musculoskeletal: Full ROM all peripheral extremities,5/5 strength, and normal gait.  Skin: Warm, dry without rashes, lesions, ecchymosis. Neuro: Awake and oriented X  3, Cranial nerves intact, reflexes equal bilaterally. Normal muscle tone, no cerebellar symptoms.  Psych:  normal affect, Insight and Judgment appropriate.     Quentin MullingAmanda Estera Ozier 1:48 PM [facility]

## 2019-09-23 ENCOUNTER — Ambulatory Visit: Payer: BC Managed Care – PPO | Admitting: Physician Assistant

## 2019-09-30 ENCOUNTER — Ambulatory Visit: Payer: BC Managed Care – PPO | Admitting: Physician Assistant

## 2019-09-30 ENCOUNTER — Encounter: Payer: Self-pay | Admitting: Physician Assistant

## 2019-09-30 ENCOUNTER — Other Ambulatory Visit: Payer: Self-pay

## 2019-09-30 VITALS — BP 130/70 | HR 78 | Temp 97.2°F | Wt 222.0 lb

## 2019-09-30 DIAGNOSIS — G4730 Sleep apnea, unspecified: Secondary | ICD-10-CM

## 2019-09-30 DIAGNOSIS — I10 Essential (primary) hypertension: Secondary | ICD-10-CM

## 2019-09-30 DIAGNOSIS — E662 Morbid (severe) obesity with alveolar hypoventilation: Secondary | ICD-10-CM | POA: Diagnosis not present

## 2019-09-30 DIAGNOSIS — F330 Major depressive disorder, recurrent, mild: Secondary | ICD-10-CM

## 2019-09-30 DIAGNOSIS — E782 Mixed hyperlipidemia: Secondary | ICD-10-CM

## 2019-09-30 DIAGNOSIS — E559 Vitamin D deficiency, unspecified: Secondary | ICD-10-CM

## 2019-09-30 DIAGNOSIS — R0600 Dyspnea, unspecified: Secondary | ICD-10-CM

## 2019-09-30 DIAGNOSIS — R0609 Other forms of dyspnea: Secondary | ICD-10-CM

## 2019-09-30 DIAGNOSIS — Z6838 Body mass index (BMI) 38.0-38.9, adult: Secondary | ICD-10-CM

## 2019-09-30 MED ORDER — FLUOCINONIDE 0.05 % EX SOLN: 1.0000 "application " | Freq: Two times a day (BID) | CUTANEOUS | 1 refills | Status: DC

## 2019-09-30 NOTE — Patient Instructions (Addendum)
Try the salonpas patches for the pain Can try massage Can send to PT if need be to see is that helps.   General eating tips  What to Avoid . Avoid added sugars o Often added sugar can be found in processed foods such as many condiments, dry cereals, cakes, cookies, chips, crisps, crackers, candies, sweetened drinks, etc.  o Read labels and AVOID/DECREASE use of foods with the following in their ingredient list: Sugar, fructose, high fructose corn syrup, sucrose, glucose, maltose, dextrose, molasses, cane sugar, brown sugar, any type of syrup, agave nectar, etc.   . Avoid snacking in between meals- drink water or if you feel you need a snack, pick a high water content snack such as cucumbers, watermelon, or any veggie.  Marland Kitchen Avoid foods made with flour o If you are going to eat food made with flour, choose those made with whole-grains; and, minimize your consumption as much as is tolerable . Avoid processed foods o These foods are generally stocked in the middle of the grocery store.  o Focus on shopping on the perimeter of the grocery.  What to Include . Vegetables o GREEN LEAFY VEGETABLES: Kale, spinach, mustard greens, collard greens, cabbage, broccoli, etc. o OTHER: Asparagus, cauliflower, eggplant, carrots, peas, Brussel sprouts, tomatoes, bell peppers, zucchini, beets, cucumbers, etc. . Grains, seeds, and legumes o Beans: kidney beans, black eyed peas, garbanzo beans, black beans, pinto beans, etc. o Whole, unrefined grains: brown rice, barley, bulgur, oatmeal, etc. . Healthy fats  o Avoid highly processed fats such as vegetable oil o Examples of healthy fats: avocado, olives, virgin olive oil, dark chocolate (?72% Cocoa), nuts (peanuts, almonds, walnuts, cashews, pecans, etc.) o Please still do small amount of these healthy fats, they are dense in calories.  . Low - Moderate Intake of Animal Sources of Protein o Meat sources: chicken, [place], salmon, tuna. Limit to 4 ounces of meat at  one time or the size of your palm. o Consider limiting dairy sources, but when choosing dairy focus on: PLAIN [place] yogurt, cottage cheese, high-protein milk . Fruit o Choose berries

## 2019-09-30 NOTE — Progress Notes (Signed)
Follow up  Assessment and Plan:  Essential hypertension - continue medications, DASH diet, exercise and monitor at home. Call if greater than 130/80.  -     CBC with Differential/Platelet -     COMPLETE METABOLIC PANEL WITH GFR -     TSH  Mixed hyperlipidemia -     Lipid panel  check lipids decrease fatty foods increase activity.   Morbid obesity (HCC) - follow up 3 months for progress monitoring - increase veggies, decrease carbs - long discussion about weight loss, diet, and exercise  Psoriasis, guttate Monitor  Vitamin D deficiency -     VITAMIN D 25 Hydroxy (Vit-D Deficiency, Fractures)  OSA on CPAP Continue to follow up Dr. Marylou Flesher  Mild episode of recurrent major depressive disorder (HCC) Continue lexapro  Medication management -     Magnesium  Abnormal glucose -     Hemoglobin A1c  Dyspnea on exertion -     albuterol (VENTOLIN HFA) 108 (90 Base) MCG/ACT inhaler; Inhale 2 puffs into the lungs every 4 (four) hours as needed for wheezing or shortness of breath. - continue follow up pulmonary - continue to try to weight loss   Discussed med's effects and SE's. Screening labs and tests as requested with regular follow-up as recommended. Future Appointments  Date Time Provider Department Center  10/02/2019 12:00 PM Genia Del, MD [facility]  10/15/2019  9:20 AM [facility] None  01/20/2020  2:00 PM Genia Del, MD [facility]  03/17/2020 10:00 AM Quentin Mulling, PA-C [facility] None    HPI  49 y.o. female  presents for follow up for obestiy, HTN, SOB, OSA, chol and vitamin D def  BMI is Body mass index is 41.95 kg/m., she is working on diet and exercise. Started on hello fresh.  Wt Readings from Last 3 Encounters:  09/30/19 222 lb (100.7 kg)  09/03/19 226 lb (102.5 kg)  07/11/19 225 lb (102.1 kg)   She is s/p VATS 03/23/2018, was still having SOB with exertion or bening over and some right sided pain with bending over, she is  following with Dr. Sherene Sires. She is going to the gym versus doing pulmonary rehab. She continues to have right sided pain with deep breathing, twisting, movement. Better with resting at night.   She is on lexapro 20 mg and wellbutrin 300mg .    And had a sleep study that showed severe OSA with AHI 37.5/h and possible central sleep apnea characteristics.  and is now on a CPAP x Nov, uses [employer]. Has still been very tired during the day, 2 hour naps, has followed with Dr. Dec, has been on O2 with CPAP x July.   Her blood pressure has been controlled at home, today their BP is BP: 130/70.  She does workout. She denies chest pain, shortness of breath, dizziness.   She is on cholesterol medication and denies myalgias. Her cholesterol is at goal. The cholesterol last visit was:  Lab Results  Component Value Date   CHOL 168 06/17/2019   HDL 65 06/17/2019   LDLCALC 79 06/17/2019   TRIG 143 06/17/2019   CHOLHDL 2.6 06/17/2019  . She has been working on diet and exercise for prediabetes, she is on bASA, she is on ACE/ARB and denies foot ulcerations, hyperglycemia, hypoglycemia , increased appetite, nausea, paresthesia of the feet, polydipsia, polyuria, visual disturbances, vomiting and weight loss. Last A1C in the office was:  Lab Results  Component Value Date   HGBA1C 5.5 06/17/2019   Patient is  on Vitamin D supplement, she is on 5000 daily.   Lab Results  Component Value Date   VD25OH 47 06/17/2019     She has recurrent sinus injections, going to call Dr. Ezzard Standing to try get nasal surgery- has been 3 years.   Current Medications:    Current Outpatient Medications (Cardiovascular):  .  rosuvastatin (CRESTOR) 5 MG tablet, TAKE 1 TABLET BY MOUTH EVERYDAY AT BEDTIME .  telmisartan (MICARDIS) 40 MG tablet, TAKE 1 TABLET DAILY FOR BLOOD PRESSURE  Current Outpatient Medications (Respiratory):  .  albuterol (VENTOLIN HFA) 108 (90 Base) MCG/ACT inhaler, INHALE 2 PUFFS INTO THE LUNGS EVERY 4  (FOUR) HOURS AS NEEDED FOR WHEEZING OR SHORTNESS OF BREATH. .  cetirizine (ZYRTEC) 10 MG tablet, Take 10 mg by mouth at bedtime. .  fexofenadine (ALLEGRA) 180 MG tablet, Take 180 mg by mouth daily as needed for allergies or rhinitis. Alternates with zyrtec when Zyrtec seems to not be as effective .  fluticasone (FLONASE) 50 MCG/ACT nasal spray, Place 2 sprays into both nostrils daily. .  montelukast (SINGULAIR) 10 MG tablet, Take 1 tablet Daily for Allergies  Current Outpatient Medications (Analgesics):  .  aspirin 81 MG tablet, Take 81 mg by mouth daily. .  meloxicam (MOBIC) 15 MG tablet, Take 1/2 to 1 tablet Daily for Pain & Inflammation  (Limit to 4-5 tabs /week Tto Avoid Kidney Damage)   Current Outpatient Medications (Other):  .  baclofen (LIORESAL) 10 MG tablet, TAKE 1/2 TO 1 TABLET 1 OR 2 X /DAY AS NEEDED FOR MUSCLE SPASMS .  buPROPion (WELLBUTRIN XL) 300 MG 24 hr tablet, Take 1 tablet every Morning for Mood, Focus & Concentration .  Cholecalciferol (VITAMIN D) 125 MCG (5000 UT) CAPS, Take 5,000 Units by mouth daily.  Marland Kitchen  escitalopram (LEXAPRO) 20 MG tablet, Take 1 tablet Daily for Mood .  Eszopiclone 3 MG TABS, Take immediately before bedtime .  fluocinolone (SYNALAR) 0.025 % ointment, APPLY TO AFFECTED AREA TWICE A DAY .  MELATONIN PO, Take 10 mg by mouth. .  Multiple Vitamin (MULTIVITAMIN) tablet, Take 1 tablet by mouth daily. Marland Kitchen  nystatin cream (MYCOSTATIN), Apply 1 application topically 2 (two) times daily. (Patient taking differently: Apply 1 application topically 2 (two) times daily as needed for dry skin. ) .  omeprazole (PRILOSEC) 40 MG capsule, TAKE 1 CAPSULE BY MOUTH EVERY DAY .  triamcinolone ointment (KENALOG) 0.1 %, Apply 1 application topically 2 (two) times daily. (Patient taking differently: Apply 1 application topically daily as needed (rash). ) .  vitamin C (ASCORBIC ACID) 500 MG tablet, Take 500 mg by mouth daily. .  vitamin E 600 UNIT capsule, Take 600 Units by mouth  daily.  Medical History:  Past Medical History:  Diagnosis Date  . Anxiety   . Arthritis   . Depression    no meds  . Diaphragmatic hernia with obstruction, without gangrene 11/07/2018  . Dyspnea    can happen anytime  . Family history of breast cancer   . Family history of colon cancer   . Family history of lung cancer   . Family history of pancreatic cancer   . Family history of prostate cancer   . GERD (gastroesophageal reflux disease)   . Hernia, diaphragmatic   . History of kidney stones   . Hyperlipidemia   . Hypertension    no meds  . Pneumonia    1 time   Allergies Allergies  Allergen Reactions  . Doxycycline Shortness Of Breath and Anxiety  .  Erythromycin Nausea Only and Other (See Comments)    Stomach cramping (can take Z Pak)  . Flexeril [Cyclobenzaprine Hcl] Other (See Comments)    "don't like the way I feel"  . Metformin And Related Other (See Comments)    GI Upset    SURGICAL HISTORY She  has a past surgical history that includes Dilation and evacuation; Dilation and curettage of uterus; Video assisted thoracoscopy (vats)/thorocotomy (Right, 03/23/2018); epigastric hernia repair (N/A, 03/23/2018); and IR THORACENTESIS ASP PLEURAL SPACE W/IMG GUIDE (04/19/2018).   FAMILY HISTORY Her family history includes Breast cancer in her cousin and maternal aunt; Breast cancer (age of onset: 45) in her mother; Cervical cancer in her paternal aunt; Colon cancer in her maternal aunt, maternal aunt, maternal uncle, and maternal uncle; Colon polyps in her maternal aunt and mother; Congestive Heart Failure in her mother; Diabetes in her brother, maternal aunt, maternal uncle, and mother; Heart disease in her maternal aunt, maternal grandmother, and maternal uncle; Hemochromatosis in her paternal uncle; Hyperlipidemia in her brother, maternal aunt, maternal uncle, and mother; Hypertension in her brother, maternal aunt, maternal uncle, and mother; Kidney disease in her mother; Lung  cancer in her mother; Ovarian cancer in her paternal aunt; Pancreatic cancer in her maternal aunt; Polycystic ovary syndrome in her paternal aunt; Prostate cancer in her cousin, cousin, and maternal uncle; Stroke in her maternal aunt and maternal uncle; Throat cancer in her maternal aunt.   SOCIAL HISTORY She  reports that she has never smoked. She has never used smokeless tobacco. She reports current alcohol use. She reports that she does not use drugs.  Review of Systems: See HPI  Physical Exam: Estimated body mass index is 41.95 kg/m as calculated from the following:   Height as of 09/03/19: 5\' 1"  (1.549 m).   Weight as of this encounter: 222 lb (100.7 kg). BP 130/70   Pulse 78   Temp (!) 97.2 F (36.2 C)   Wt 222 lb (100.7 kg)   SpO2 95%   BMI 41.95 kg/m   General Appearance: Well nourished well developed, in no apparent distress.  Eyes: PERRLA, EOMs, conjunctiva no swelling or erythema ENT/Mouth: Ear canals normal without obstruction, swelling, erythema, or discharge.  TMs normal bilaterally with no erythema, bulging, retraction, or loss of landmark.  Oropharynx moist and clear with no exudate, erythema, or swelling.  Neck: Supple, thyroid normal. No bruits.  No cervical adenopathy Respiratory: Respiratory effort normal, Breath sounds clear A&P without wheeze, rhonchi, rales.   Cardio: RRR without murmurs, rubs or gallops. Brisk peripheral pulses without edema.  Chest: symmetric, with normal excursions Abdomen: Soft, nontender, no guarding, rebound, hernias, masses, or organomegaly.  Lymphatics: Non tender without lymphadenopathy.  Musculoskeletal: Full ROM all peripheral extremities,5/5 strength, and normal gait.  Skin: Warm, dry without rashes, lesions, ecchymosis. Neuro: Awake and oriented X 3, Cranial nerves intact, reflexes equal bilaterally. Normal muscle tone, no cerebellar symptoms.  Psych:  normal affect, Insight and Judgment appropriate.     2:48  PM [facility]

## 2019-10-01 LAB — COMPLETE METABOLIC PANEL WITH GFR
AG Ratio: 1.8 (calc) (ref 1.0–2.5)
ALT: 30 U/L — ABNORMAL HIGH (ref 6–29)
AST: 28 U/L (ref 10–35)
Albumin: 4.4 g/dL (ref 3.6–5.1)
Alkaline phosphatase (APISO): 67 U/L (ref 31–125)
BUN: 15 mg/dL (ref 7–25)
CO2: 27 mmol/L (ref 20–32)
Calcium: 9.8 mg/dL (ref 8.6–10.2)
Chloride: 104 mmol/L (ref 98–110)
Creat: 1.01 mg/dL (ref 0.50–1.10)
GFR, Est African American: 76 mL/min/{1.73_m2} (ref >=60)
GFR, Est Non African American: 65 mL/min/{1.73_m2} (ref >=60)
Globulin: 2.4 g/dL (calc) (ref 1.9–3.7)
Glucose, Bld: 104 mg/dL — ABNORMAL HIGH (ref 65–99)
Potassium: 4.5 mmol/L (ref 3.5–5.3)
Sodium: 139 mmol/L (ref 135–146)
Total Bilirubin: 0.6 mg/dL (ref 0.2–1.2)
Total Protein: 6.8 g/dL (ref 6.1–8.1)

## 2019-10-01 LAB — CBC WITH DIFFERENTIAL/PLATELET
Absolute Monocytes: 553 cells/uL (ref 200–950)
Basophils Absolute: 33 cells/uL (ref 0–200)
Basophils Relative: 0.5 %
Eosinophils Absolute: 143 cells/uL (ref 15–500)
Eosinophils Relative: 2.2 %
HCT: 39.2 % (ref 35.0–45.0)
Hemoglobin: 13.1 g/dL (ref 11.7–15.5)
Lymphs Abs: 2067 cells/uL (ref 850–3900)
MCH: 32 pg (ref 27.0–33.0)
MCHC: 33.4 g/dL (ref 32.0–36.0)
MCV: 95.6 fL (ref 80.0–100.0)
MPV: 9.7 fL (ref 7.5–12.5)
Monocytes Relative: 8.5 %
Neutro Abs: 3705 cells/uL (ref 1500–7800)
Neutrophils Relative %: 57 %
Platelets: 292 10*3/uL (ref 140–400)
RBC: 4.1 10*6/uL (ref 3.80–5.10)
RDW: 12.3 % (ref 11.0–15.0)
Total Lymphocyte: 31.8 %
WBC: 6.5 10*3/uL (ref 3.8–10.8)

## 2019-10-01 LAB — LIPID PANEL
Cholesterol: 170 mg/dL (ref <200)
HDL: 62 mg/dL (ref >=50)
LDL Cholesterol (Calc): 75 mg/dL (calc)
Non-HDL Cholesterol (Calc): 108 mg/dL (calc) (ref <130)
Total CHOL/HDL Ratio: 2.7 (calc) (ref <5.0)
Triglycerides: 248 mg/dL — ABNORMAL HIGH (ref <150)

## 2019-10-01 LAB — TSH: TSH: 2.23 mIU/L

## 2019-10-02 ENCOUNTER — Encounter: Payer: Self-pay | Admitting: Obstetrics & Gynecology

## 2019-10-02 ENCOUNTER — Other Ambulatory Visit: Payer: Self-pay

## 2019-10-02 ENCOUNTER — Ambulatory Visit: Payer: BC Managed Care – PPO | Admitting: Obstetrics & Gynecology

## 2019-10-02 VITALS — BP 126/84

## 2019-10-02 DIAGNOSIS — N921 Excessive and frequent menstruation with irregular cycle: Secondary | ICD-10-CM

## 2019-10-02 DIAGNOSIS — R9389 Abnormal findings on diagnostic imaging of other specified body structures: Secondary | ICD-10-CM

## 2019-10-02 DIAGNOSIS — N840 Polyp of corpus uteri: Secondary | ICD-10-CM

## 2019-10-02 NOTE — Progress Notes (Signed)
    Samantha Greene Jul 06, 1970 [id number]        49 y.o.  G1P0A1 Married  RP: Preop HSC/Myosure Excision/D+C 10/18/2019  HPI: Menometrorrhagia.  Pelvic cramps/pain.  Sonohysto 09/05/2019 IU lesion c/w Endometrial Polyp 1.6 cm.   OB History  Gravida Para Term Preterm AB Living  1 0     0 0  SAB TAB Ectopic Multiple Live Births      0        # Outcome Date GA Lbr Len/2nd Weight Sex Delivery Anes PTL Lv  1 Gravida             Past medical history,surgical history, problem list, medications, allergies, family history and social history were all reviewed and documented in the EPIC chart.   Directed ROS with pertinent positives and negatives documented in the history of present illness/assessment and plan.  Exam:  Vitals:   10/02/19 1221  BP: 126/84   General appearance:  Normal  SonoHysto 09/05/2019: The initial transvaginal ultrasound demonstrated the following:  T/V images. Limited ultrasound today due to recent ultrasound. Endometrial lining is thickened at 7.52 mm.  The speculum was inserted and the cervix cleansed with Betadine solution after confirming that patient has no allergies. A small sonohysterography catheterwas utilized. Insertion was facilitated with ring forceps, using a spear-like motion the catheter was inserted to the fundus of the uterus. The speculum is then removed carefully to avoid dislodging the catheter. The catheter was flushed with sterile saline delete prior to insertion to rid it of small amounts of air.the sterile saline solution was infused into the uterine cavity as a vaginal ultrasound probe was then placed in the vagina for full visualization of the uterine cavity from a transvaginal approach. The following was noted:  Sonohysterogram performed using 0.9% normal saline. After normal saline was introduced into the intra uterine cavity, a filling defect was seen measured at 1.6 x 0.6 cm.  The catheter was then removed after retrieving  some of the saline from the intrauterine cavity. An endometrial biopsywas notdone. Patient tolerated procedure well. She had received a tablet of Aleve for discomfort.    Assessment/Plan:  49 y.o. G1P0A1  1. Menometrorrhagia Menometrorrhagia with an endometrial polyp by sonohysterogram.  Decision to proceed with hysteroscopy with MyoSure excision and dilation and curettage.  Preop preparation, surgery and risks as well as postop expectations and precautions thoroughly reviewed with patient.  Patient voiced understanding and agreement with plan.  Patient would like to be started on a birth control pill at the 3-week postop visit to further control her cycle.  Will probably start on a low dosage continuous birth control pill.  2. Endometrial polyp As above.                        Patient was counseled as to the risk of surgery to include the following:  1. Infection (prohylactic antibiotics will be administered)  2. DVT/Pulmonary Embolism (prophylactic pneumo compression stockings will be used)  3.Trauma to internal organs requiring additional surgical procedure to repair any injury to internal organs requiring perhaps additional hospitalization days.  4.Hemmorhage requiring transfusion and blood products which carry risks such as anaphylactic reaction, hepatitis and AIDS  Patient had received literature information on the procedure scheduled and all her questions were answered and fully accepts all risk.   Genia Del MD, 12:24 PM 10/02/2019

## 2019-10-15 ENCOUNTER — Other Ambulatory Visit: Payer: Self-pay

## 2019-10-15 ENCOUNTER — Encounter (HOSPITAL_BASED_OUTPATIENT_CLINIC_OR_DEPARTMENT_OTHER): Payer: Self-pay | Admitting: Obstetrics & Gynecology

## 2019-10-15 ENCOUNTER — Other Ambulatory Visit (HOSPITAL_COMMUNITY)
Admission: RE | Admit: 2019-10-15 | Discharge: 2019-10-15 | Disposition: A | Discharge status: Home / Self Care | Payer: BC Managed Care – PPO | Source: Ambulatory Visit | Attending: Obstetrics & Gynecology | Admitting: Obstetrics & Gynecology

## 2019-10-15 ENCOUNTER — Other Ambulatory Visit (HOSPITAL_COMMUNITY): Payer: BC Managed Care – PPO

## 2019-10-15 ENCOUNTER — Encounter (HOSPITAL_COMMUNITY)
Admission: RE | Admit: 2019-10-15 | Discharge: 2019-10-15 | Disposition: A | Discharge status: Home / Self Care | Payer: BC Managed Care – PPO | Source: Ambulatory Visit | Attending: Obstetrics & Gynecology | Admitting: Obstetrics & Gynecology

## 2019-10-15 DIAGNOSIS — Z01812 Encounter for preprocedural laboratory examination: Secondary | ICD-10-CM | POA: Insufficient documentation

## 2019-10-15 DIAGNOSIS — Z20822 Contact with and (suspected) exposure to covid-19: Secondary | ICD-10-CM | POA: Insufficient documentation

## 2019-10-15 LAB — BASIC METABOLIC PANEL
Anion gap: 11 (ref 5–15)
BUN: 13 mg/dL (ref 6–20)
CO2: 23 mmol/L (ref 22–32)
Calcium: 9.2 mg/dL (ref 8.9–10.3)
Chloride: 103 mmol/L (ref 98–111)
Creatinine, Ser: 0.96 mg/dL (ref 0.44–1.00)
GFR calc Af Amer: >60 mL/min (ref >60)
GFR calc non Af Amer: >60 mL/min (ref >60)
Glucose, Bld: 96 mg/dL (ref 70–99)
Potassium: 4.3 mmol/L (ref 3.5–5.1)
Sodium: 137 mmol/L (ref 135–145)

## 2019-10-15 LAB — CBC
HCT: 39.4 % (ref 36.0–46.0)
Hemoglobin: 12.8 g/dL (ref 12.0–15.0)
MCH: 32.1 pg (ref 26.0–34.0)
MCHC: 32.5 g/dL (ref 30.0–36.0)
MCV: 98.7 fL (ref 80.0–100.0)
Platelets: 262 10*3/uL (ref 150–400)
RBC: 3.99 MIL/uL (ref 3.87–5.11)
RDW: 12.9 % (ref 11.5–15.5)
WBC: 5.5 10*3/uL (ref 4.0–10.5)
nRBC: 0 % (ref 0.0–0.2)

## 2019-10-15 LAB — SARS CORONAVIRUS 2 (TAT 6-24 HRS): SARS Coronavirus 2: NEGATIVE

## 2019-10-15 NOTE — Progress Notes (Addendum)
Addendum: spoke with jessica zanetto pa, pt ok for [facility] per dr bass Natale Lay w/ via phone for pre-op interview---pt Lab needs dos----  none             Lab results------cbc, bmet 10-15-2019 epic COVID test ------10-15-2019 1030 Arrive at -------930 am 10-18-2019 NPO after MN NO Solid Food.  Clear liquids from MN until---830 am then npo Medications to take morning of surgery -----albuterol inahler prn/bring inhaler, bupropion, flonase, omeprazole Diabetic medication -----n/a Patient Special Instructions -----bring cpap mask tubing and machine Pre-Op special Istructions ----- Patient verbalized understanding of instructions that were given at this phone interview. Patient denies shortness of breath, chest pain, fever, cough at this phone interview.  Anesthesia Review: osa with cpap uses 2 liter oxygen at hs  Chart to jessica zanetto pa for review  PCP:dr mccowman Cardiologist :none Chest x-ray :09-03-2019 epic EKG :03-28-2019 epic Echo :none Stress test:none Cardiac Cath : none lov cardio/thoracic  Activity level: occ sob with exertion can climb stairs without difficulty Sleep Study/ CPAP :uses cpap not sure when last sleep study done Fasting Blood Sugar :      / Checks Blood Sugar -- times a day: n/a  Blood Thinner/ Instructions /Last Dose:n/a ASA / Instructions/ Last Dose : pt plans to stop 81 mg aspirin prior to surgery

## 2019-10-16 ENCOUNTER — Other Ambulatory Visit: Payer: Self-pay | Admitting: Internal Medicine

## 2019-10-18 ENCOUNTER — Encounter (HOSPITAL_BASED_OUTPATIENT_CLINIC_OR_DEPARTMENT_OTHER): Payer: Self-pay | Admitting: Obstetrics & Gynecology

## 2019-10-18 ENCOUNTER — Encounter (HOSPITAL_BASED_OUTPATIENT_CLINIC_OR_DEPARTMENT_OTHER)
Admission: RE | Disposition: A | Discharge status: Home / Self Care | Payer: Self-pay | Source: Home / Self Care | Attending: Obstetrics & Gynecology

## 2019-10-18 ENCOUNTER — Ambulatory Visit (HOSPITAL_BASED_OUTPATIENT_CLINIC_OR_DEPARTMENT_OTHER): Payer: BC Managed Care – PPO | Admitting: Anesthesiology

## 2019-10-18 ENCOUNTER — Ambulatory Visit (HOSPITAL_BASED_OUTPATIENT_CLINIC_OR_DEPARTMENT_OTHER): Payer: BC Managed Care – PPO | Admitting: Physician Assistant

## 2019-10-18 ENCOUNTER — Ambulatory Visit (HOSPITAL_BASED_OUTPATIENT_CLINIC_OR_DEPARTMENT_OTHER)
Admission: RE | Admit: 2019-10-18 | Discharge: 2019-10-18 | Disposition: A | Discharge status: Home / Self Care | Payer: BC Managed Care – PPO | Attending: Obstetrics & Gynecology | Admitting: Obstetrics & Gynecology

## 2019-10-18 DIAGNOSIS — Z833 Family history of diabetes mellitus: Secondary | ICD-10-CM | POA: Diagnosis not present

## 2019-10-18 DIAGNOSIS — E785 Hyperlipidemia, unspecified: Secondary | ICD-10-CM | POA: Insufficient documentation

## 2019-10-18 DIAGNOSIS — Z841 Family history of disorders of kidney and ureter: Secondary | ICD-10-CM | POA: Diagnosis not present

## 2019-10-18 DIAGNOSIS — K219 Gastro-esophageal reflux disease without esophagitis: Secondary | ICD-10-CM | POA: Diagnosis not present

## 2019-10-18 DIAGNOSIS — F419 Anxiety disorder, unspecified: Secondary | ICD-10-CM | POA: Diagnosis not present

## 2019-10-18 DIAGNOSIS — F329 Major depressive disorder, single episode, unspecified: Secondary | ICD-10-CM | POA: Diagnosis not present

## 2019-10-18 DIAGNOSIS — I10 Essential (primary) hypertension: Secondary | ICD-10-CM | POA: Insufficient documentation

## 2019-10-18 DIAGNOSIS — G4730 Sleep apnea, unspecified: Secondary | ICD-10-CM | POA: Diagnosis not present

## 2019-10-18 DIAGNOSIS — Z808 Family history of malignant neoplasm of other organs or systems: Secondary | ICD-10-CM | POA: Insufficient documentation

## 2019-10-18 DIAGNOSIS — Z79899 Other long term (current) drug therapy: Secondary | ICD-10-CM | POA: Diagnosis not present

## 2019-10-18 DIAGNOSIS — Z8249 Family history of ischemic heart disease and other diseases of the circulatory system: Secondary | ICD-10-CM | POA: Insufficient documentation

## 2019-10-18 DIAGNOSIS — Z6841 Body Mass Index (BMI) 40.0 and over, adult: Secondary | ICD-10-CM | POA: Insufficient documentation

## 2019-10-18 DIAGNOSIS — Z8349 Family history of other endocrine, nutritional and metabolic diseases: Secondary | ICD-10-CM | POA: Insufficient documentation

## 2019-10-18 DIAGNOSIS — Z8371 Family history of colonic polyps: Secondary | ICD-10-CM | POA: Insufficient documentation

## 2019-10-18 DIAGNOSIS — Z888 Allergy status to other drugs, medicaments and biological substances status: Secondary | ICD-10-CM | POA: Insufficient documentation

## 2019-10-18 DIAGNOSIS — Z7982 Long term (current) use of aspirin: Secondary | ICD-10-CM | POA: Insufficient documentation

## 2019-10-18 DIAGNOSIS — Z800 Family history of malignant neoplasm of digestive organs: Secondary | ICD-10-CM | POA: Diagnosis not present

## 2019-10-18 DIAGNOSIS — Z803 Family history of malignant neoplasm of breast: Secondary | ICD-10-CM | POA: Insufficient documentation

## 2019-10-18 DIAGNOSIS — N840 Polyp of corpus uteri: Secondary | ICD-10-CM | POA: Diagnosis not present

## 2019-10-18 DIAGNOSIS — Z8042 Family history of malignant neoplasm of prostate: Secondary | ICD-10-CM | POA: Insufficient documentation

## 2019-10-18 DIAGNOSIS — Z87442 Personal history of urinary calculi: Secondary | ICD-10-CM | POA: Insufficient documentation

## 2019-10-18 DIAGNOSIS — N921 Excessive and frequent menstruation with irregular cycle: Secondary | ICD-10-CM | POA: Insufficient documentation

## 2019-10-18 DIAGNOSIS — M1990 Unspecified osteoarthritis, unspecified site: Secondary | ICD-10-CM | POA: Diagnosis not present

## 2019-10-18 DIAGNOSIS — Z791 Long term (current) use of non-steroidal anti-inflammatories (NSAID): Secondary | ICD-10-CM | POA: Diagnosis not present

## 2019-10-18 DIAGNOSIS — Z823 Family history of stroke: Secondary | ICD-10-CM | POA: Insufficient documentation

## 2019-10-18 DIAGNOSIS — E559 Vitamin D deficiency, unspecified: Secondary | ICD-10-CM | POA: Diagnosis not present

## 2019-10-18 DIAGNOSIS — Z881 Allergy status to other antibiotic agents status: Secondary | ICD-10-CM | POA: Insufficient documentation

## 2019-10-18 DIAGNOSIS — Z801 Family history of malignant neoplasm of trachea, bronchus and lung: Secondary | ICD-10-CM | POA: Diagnosis not present

## 2019-10-18 DIAGNOSIS — Z7951 Long term (current) use of inhaled steroids: Secondary | ICD-10-CM | POA: Insufficient documentation

## 2019-10-18 DIAGNOSIS — Z832 Family history of diseases of the blood and blood-forming organs and certain disorders involving the immune mechanism: Secondary | ICD-10-CM | POA: Insufficient documentation

## 2019-10-18 DIAGNOSIS — Z8041 Family history of malignant neoplasm of ovary: Secondary | ICD-10-CM | POA: Insufficient documentation

## 2019-10-18 HISTORY — PX: DILATATION & CURETTAGE/HYSTEROSCOPY WITH MYOSURE: SHX6511

## 2019-10-18 SURGERY — DILATATION & CURETTAGE/HYSTEROSCOPY WITH MYOSURE
Anesthesia: General | Site: Uterus

## 2019-10-18 MED ORDER — FENTANYL CITRATE (PF) 100 MCG/2ML IJ SOLN: 25.0000 ug | INTRAMUSCULAR | Status: DC | PRN

## 2019-10-18 MED ORDER — CEFAZOLIN SODIUM-DEXTROSE 2-4 GM/100ML-% IV SOLN
2.0000 g | INTRAVENOUS | Status: AC
  Administered 2019-10-18: 2 g via INTRAVENOUS

## 2019-10-18 MED ORDER — CEFAZOLIN SODIUM-DEXTROSE 2-4 GM/100ML-% IV SOLN
INTRAVENOUS | Status: AC
  Filled 2019-10-18: qty 100

## 2019-10-18 MED ORDER — MIDAZOLAM HCL 2 MG/2ML IJ SOLN
INTRAMUSCULAR | Status: AC
  Filled 2019-10-18: qty 2

## 2019-10-18 MED ORDER — POVIDONE-IODINE 10 % EX SWAB: 2.0000 "application " | Freq: Once | CUTANEOUS | Status: DC

## 2019-10-18 MED ORDER — PROMETHAZINE HCL 25 MG/ML IJ SOLN: 6.2500 mg | INTRAMUSCULAR | Status: DC | PRN

## 2019-10-18 MED ORDER — PROPOFOL 10 MG/ML IV BOLUS
INTRAVENOUS | Status: DC | PRN
  Administered 2019-10-18: 160 mg via INTRAVENOUS

## 2019-10-18 MED ORDER — LIDOCAINE 2% (20 MG/ML) 5 ML SYRINGE
INTRAMUSCULAR | Status: AC
  Filled 2019-10-18: qty 5

## 2019-10-18 MED ORDER — LIDOCAINE HCL 1 % IJ SOLN
INTRAMUSCULAR | Status: DC | PRN
  Administered 2019-10-18: 10 mL

## 2019-10-18 MED ORDER — ONDANSETRON HCL 4 MG/2ML IJ SOLN
INTRAMUSCULAR | Status: DC | PRN
  Administered 2019-10-18: 4 mg via INTRAVENOUS

## 2019-10-18 MED ORDER — LIDOCAINE 2% (20 MG/ML) 5 ML SYRINGE
INTRAMUSCULAR | Status: DC | PRN
  Administered 2019-10-18: 100 mg via INTRAVENOUS

## 2019-10-18 MED ORDER — LACTATED RINGERS IV SOLN: INTRAVENOUS | Status: DC

## 2019-10-18 MED ORDER — KETOROLAC TROMETHAMINE 30 MG/ML IJ SOLN
INTRAMUSCULAR | Status: AC
  Filled 2019-10-18: qty 1

## 2019-10-18 MED ORDER — FENTANYL CITRATE (PF) 100 MCG/2ML IJ SOLN
INTRAMUSCULAR | Status: AC
  Filled 2019-10-18: qty 2

## 2019-10-18 MED ORDER — DEXAMETHASONE SODIUM PHOSPHATE 10 MG/ML IJ SOLN
INTRAMUSCULAR | Status: DC | PRN
  Administered 2019-10-18: 10 mg via INTRAVENOUS

## 2019-10-18 MED ORDER — EPHEDRINE SULFATE-NACL 50-0.9 MG/10ML-% IV SOSY
PREFILLED_SYRINGE | INTRAVENOUS | Status: DC | PRN
  Administered 2019-10-18 (×2): 10 mg via INTRAVENOUS

## 2019-10-18 MED ORDER — MIDAZOLAM HCL 5 MG/5ML IJ SOLN
INTRAMUSCULAR | Status: DC | PRN
  Administered 2019-10-18: 2 mg via INTRAVENOUS

## 2019-10-18 MED ORDER — KETOROLAC TROMETHAMINE 30 MG/ML IJ SOLN
30.0000 mg | Freq: Once | INTRAMUSCULAR | Status: AC | PRN
  Administered 2019-10-18: 30 mg via INTRAVENOUS

## 2019-10-18 MED ORDER — FENTANYL CITRATE (PF) 100 MCG/2ML IJ SOLN
INTRAMUSCULAR | Status: DC | PRN
Start: 2019-10-18 — End: 2019-10-18
  Administered 2019-10-18: 25 ug via INTRAVENOUS
  Administered 2019-10-18: 50 ug via INTRAVENOUS
  Administered 2019-10-18: 25 ug via INTRAVENOUS

## 2019-10-18 MED ORDER — SODIUM CHLORIDE 0.9 % IR SOLN
Status: DC | PRN
  Administered 2019-10-18: 3000 mL

## 2019-10-18 MED ORDER — DEXAMETHASONE SODIUM PHOSPHATE 10 MG/ML IJ SOLN
INTRAMUSCULAR | Status: AC
  Filled 2019-10-18: qty 1

## 2019-10-18 MED ORDER — ONDANSETRON HCL 4 MG/2ML IJ SOLN
INTRAMUSCULAR | Status: AC
  Filled 2019-10-18: qty 2

## 2019-10-18 MED ORDER — PROPOFOL 10 MG/ML IV BOLUS
INTRAVENOUS | Status: AC
  Filled 2019-10-18: qty 20

## 2019-10-18 SURGICAL SUPPLY — 22 items
CATH ROBINSON RED A/P 16FR (CATHETERS) ×3 IMPLANT
COVER WAND RF STERILE (DRAPES) ×3 IMPLANT
DEVICE MYOSURE LITE (MISCELLANEOUS) IMPLANT
DEVICE MYOSURE REACH (MISCELLANEOUS) ×2 IMPLANT
DILATOR CANAL MILEX (MISCELLANEOUS) IMPLANT
ELECT REM PT RETURN 9FT ADLT (ELECTROSURGICAL)
ELECTRODE REM PT RTRN 9FT ADLT (ELECTROSURGICAL) IMPLANT
GAUZE 4X4 16PLY RFD (DISPOSABLE) ×3 IMPLANT
GLOVE BIO SURGEON STRL SZ 6.5 (GLOVE) ×2 IMPLANT
GLOVE BIO SURGEONS STRL SZ 6.5 (GLOVE) ×1
GLOVE BIOGEL PI IND STRL 7.0 (GLOVE) ×2 IMPLANT
GLOVE BIOGEL PI INDICATOR 7.0 (GLOVE) ×4
GOWN STRL REUS W/TWL LRG LVL3 (GOWN DISPOSABLE) ×6 IMPLANT
IV NS IRRIG 3000ML ARTHROMATIC (IV SOLUTION) ×3 IMPLANT
KIT PROCEDURE FLUENT (KITS) ×3 IMPLANT
MYOSURE XL FIBROID (MISCELLANEOUS)
PACK VAGINAL MINOR WOMEN LF (CUSTOM PROCEDURE TRAY) ×3 IMPLANT
PAD OB MATERNITY 4.3X12.25 (PERSONAL CARE ITEMS) ×3 IMPLANT
PAD PREP 24X48 CUFFED NSTRL (MISCELLANEOUS) ×3 IMPLANT
SEAL CERVICAL OMNI LOK (ABLATOR) IMPLANT
SEAL ROD LENS SCOPE MYOSURE (ABLATOR) ×3 IMPLANT
SYSTEM TISS REMOVAL MYOSURE XL (MISCELLANEOUS) IMPLANT

## 2019-10-18 NOTE — Transfer of Care (Signed)
Immediate Anesthesia Transfer of Care Note  Patient: Samantha Greene  Procedure(s) Performed: DILATATION & CURETTAGE/HYSTEROSCOPY WITH MYOSURE (N/A Uterus)  Patient Location: PACU  Anesthesia Type:General  Level of Consciousness: awake  Airway & Oxygen Therapy: Patient Spontanous Breathing and Patient connected to face mask oxygen  Post-op Assessment: Report given to RN and Post -op Vital signs reviewed and stable  Post vital signs: Reviewed and stable  Last Vitals:  Vitals Value Taken Time  BP 142/70 10/18/19 1213  Temp    Pulse    Resp 17 10/18/19 1215  SpO2    Vitals shown include unvalidated device data.  Last Pain:  Vitals:   10/18/19 1021  TempSrc: Oral  PainSc: 0-No pain      Patients Stated Pain Goal: 3 (10/18/19 1021)  Complications: No complications documented.

## 2019-10-18 NOTE — Anesthesia Preprocedure Evaluation (Signed)
Anesthesia Evaluation  Patient identified by MRN, date of birth, ID band Patient awake    Reviewed: Allergy & Precautions, NPO status , Patient's Chart, lab work & pertinent test results  Airway Mallampati: II  TM Distance: >3 FB Neck ROM: Full    Dental no notable dental hx.    Pulmonary sleep apnea ,    Pulmonary exam normal breath sounds clear to auscultation       Cardiovascular hypertension, Normal cardiovascular exam Rhythm:Regular Rate:Normal     Neuro/Psych negative neurological ROS  negative psych ROS   GI/Hepatic Neg liver ROS, GERD  Medicated,  Endo/Other  Morbid obesity  Renal/GU negative Renal ROS  negative genitourinary   Musculoskeletal negative musculoskeletal ROS (+)   Abdominal   Peds negative pediatric ROS (+)  Hematology negative hematology ROS (+)   Anesthesia Other Findings   Reproductive/Obstetrics negative OB ROS                             Anesthesia Physical Anesthesia Plan  ASA: II  Anesthesia Plan: General   Post-op Pain Management:    Induction: Intravenous  PONV Risk Score and Plan: 3 and Ondansetron, Dexamethasone, Midazolam and Treatment may vary due to age or medical condition  Airway Management Planned: LMA  Additional Equipment:   Intra-op Plan:   Post-operative Plan: Extubation in OR  Informed Consent: I have reviewed the patients History and Physical, chart, labs and discussed the procedure including the risks, benefits and alternatives for the proposed anesthesia with the patient or authorized representative who has indicated his/her understanding and acceptance.     Dental advisory given  Plan Discussed with: CRNA and Surgeon  Anesthesia Plan Comments:         Anesthesia Quick Evaluation

## 2019-10-18 NOTE — Anesthesia Postprocedure Evaluation (Signed)
Anesthesia Post Note  Patient: Samantha Greene  Procedure(s) Performed: DILATATION & CURETTAGE/HYSTEROSCOPY WITH MYOSURE (N/A Uterus)     Anesthesia Type: General Anesthetic complications: no   No complications documented.  Last Vitals:  Vitals:   10/18/19 1021 10/18/19 1215  BP: 124/79 (!) 142/70  Pulse: 73   Resp: 18 17  Temp: 37.2 C 36.7 C  SpO2: 98% 100%    Last Pain:  Vitals:   10/18/19 1215  TempSrc:   PainSc: 6                  Teran Knittle S

## 2019-10-18 NOTE — Anesthesia Procedure Notes (Signed)
Procedure Name: LMA Insertion Date/Time: 10/18/2019 11:32 AM Performed by: Jacorion Klem D, CRNA Pre-anesthesia Checklist: Patient identified, Emergency Drugs available, Suction available and Patient being monitored Patient Re-evaluated:Patient Re-evaluated prior to induction Oxygen Delivery Method: Circle system utilized Preoxygenation: Pre-oxygenation with 100% oxygen Induction Type: IV induction Ventilation: Mask ventilation without difficulty LMA: LMA inserted LMA Size: 3.0 Tube type: Oral Number of attempts: 1 Placement Confirmation: positive ETCO2 and breath sounds checked- equal and bilateral Tube secured with: Tape Dental Injury: Teeth and Oropharynx as per pre-operative assessment

## 2019-10-18 NOTE — Op Note (Addendum)
Operative Note  10/18/2019  12:10 PM  PATIENT:  Samantha Greene  49 y.o. female  PRE-OPERATIVE DIAGNOSIS:  menometrorrhagia, endometrial polyp  POST-OPERATIVE DIAGNOSIS:  menometrorrhagia, endometrial polyp  PROCEDURE:  Procedure(s): DILATATION & CURETTAGE/HYSTEROSCOPY WITH MYOSURE EXCISION  SURGEON:  Surgeon(s): Genia Del, MD  ANESTHESIA:   general  FINDINGS: Endometrial Polyp and thickened endometrium.  DESCRIPTION OF OPERATION: Under general anesthesia with laryngeal mask, the patient is in lithotomy position.  She is prepped with Betadine on the suprapubic, vulvar and vaginal areas.  The bladder is catheterized.  The patient is draped as usual.  Timeout is done.  The vaginal exam reveals a retroverted uterus, normal volume and mobile.  No adnexal mass felt.  The speculum is inserted in the vagina.  The anterior lip of the cervix is grasped with a tenaculum.  A paracervical block is done with lidocaine 1% a total of 20 cc at 4 and 8:00.  Dilation of the cervix with Pratt dilators up to #21 without difficulty.  Insertion of the hysteroscope with the MyoSure instrument.  Inspection of the intra uterine cavity reveals a lesion at the anterior endometrial wall corresponding to a probable polyp measuring about 1.5 to 2 cm.  The posterior aspect of the intra uterine cavity is showing a thickened endometrium.  Pictures were taken.  Excision of the endometrial polyp as well as the thickened endometrium with the reach MyoSure.  Pictures were taken after excisions and new resections.  Hemostasis was adequate.  The hysteroscope with MyoSure were removed.  Systematic curettage of the intra uterine cavity on all surfaces with a sharp curette.  Both specimens were sent together to pathology.  The curette was removed.  The tenaculum was removed from the cervix.  Hemostasis was adequate.  The speculum was removed.  The patient was brought to recovery room in good and stable status.  ESTIMATED  BLOOD LOSS: 5 cc Fluid Deficit: <100 cc   Intake/Output Summary (Last 24 hours) at 10/18/2019 1210 Last data filed at 10/18/2019 1147 Gross per 24 hour  Intake 500 ml  Output 200 ml  Net 300 ml     BLOOD ADMINISTERED:none   LOCAL MEDICATIONS USED:  LIDOCAINE   SPECIMEN:  Source of Specimen:  Excision material/Polyp and Endometrial curettings  DISPOSITION OF SPECIMEN:  PATHOLOGY  COUNTS:  YES  PLAN OF CARE: Transfer to PACU  Marie-Lyne LavoieMD12:10 PM

## 2019-10-18 NOTE — Discharge Instructions (Addendum)
Hysteroscopy, Care After This sheet gives you information about how to care for yourself after your procedure. Your health care provider may also give you more specific instructions. If you have problems or questions, contact your health care provider. What can I expect after the procedure? After the procedure, it is common to have:  Cramping.  Bleeding. This can vary from light spotting to menstrual-like bleeding. Follow these instructions at home: Activity  Rest for 1-2 days after the procedure.  Do not douche, use tampons, or have sex for 2 weeks after the procedure, or until your health care provider approves.  Do not drive for 24 hours after the procedure, or for as long as told by your health care provider.  Do not drive, use heavy machinery, or drink alcohol while taking prescription pain medicines. Medicines   Take over-the-counter and prescription medicines only as told by your health care provider.  Do not take aspirin during recovery. It can increase the risk of bleeding. General instructions  Do not take baths, swim, or use a hot tub until your health care provider approves. Take showers instead of baths for 2 weeks, or for as long as told by your health care provider.  To prevent or treat constipation while you are taking prescription pain medicine, your health care provider may recommend that you: ? Drink enough fluid to keep your urine clear or pale yellow. ? Take over-the-counter or prescription medicines. ? Eat foods that are high in fiber, such as fresh fruits and vegetables, whole grains, and beans. ? Limit foods that are high in fat and processed sugars, such as fried and sweet foods.  Keep all follow-up visits as told by your health care provider. This is important. Contact a health care provider if:  You feel dizzy or lightheaded.  You feel nauseous.  You have abnormal vaginal discharge.  You have a rash.  You have pain that does not get better with  medicine.  You have chills. Get help right away if:  You have bleeding that is heavier than a normal menstrual period.  You have a fever.  You have pain or cramps that get worse.  You develop new abdominal pain.  You faint.  You have pain in your shoulders.  You have shortness of breath. Summary  After the procedure, you may have cramping and some vaginal bleeding.  Do not douche, use tampons, or have sex for 2 weeks after the procedure, or until your health care provider approves.  Do not take baths, swim, or use a hot tub until your health care provider approves. Take showers instead of baths for 2 weeks, or for as long as told by your health care provider.  Report any unusual symptoms to your health care provider.  Keep all follow-up visits as told by your health care provider. This is important. This information is not intended to replace advice given to you by your health care provider. Make sure you discuss any questions you have with your health care provider. Document Revised: 12/30/2016 Document Reviewed: 02/16/2016 Elsevier Patient Education  2020 [employer]    Post Anesthesia Home Care Instructions  Activity: Get plenty of rest for the remainder of the day. A responsible individual must stay with you for 24 hours following the procedure.  For the next 24 hours, DO NOT: -Drive a car -Operate machinery -Drink alcoholic beverages -Take any medication unless instructed by your physician -Make any legal decisions or sign important papers.  Meals: Start with liquid   foods such as gelatin or soup. Progress to regular foods as tolerated. Avoid greasy, spicy, heavy foods. If nausea and/or vomiting occur, drink only clear liquids until the nausea and/or vomiting subsides. Call your physician if vomiting continues.  Special Instructions/Symptoms: Your throat may feel dry or sore from the anesthesia or the breathing tube placed in your throat during surgery. If this  causes discomfort, gargle with warm salt water. The discomfort should disappear within 24 hours.       

## 2019-10-18 NOTE — H&P (Signed)
Samantha Greene is an 49 y.o. female. G1P0A1 Married  RP: HSC/Myosure Excision/D+C 10/18/2019  HPI: No change x last visit.  Menometrorrhagia.  Pelvic cramps/pain. Sonohysto 09/05/2019 IU lesion c/w Endometrial Polyp 1.6 cm.  Menstrual History: Patient's last menstrual period was 09/20/2019.    Past Medical History:  Diagnosis Date  . Anxiety   . Arthritis   . Depression    no meds  . Diaphragmatic hernia with obstruction, without gangrene 11/07/2018  . Dyspnea    doe with exertion occ  . Family history of breast cancer   . Family history of colon cancer   . Family history of lung cancer   . Family history of pancreatic cancer   . Family history of prostate cancer   . GERD (gastroesophageal reflux disease)   . Hernia, diaphragmatic   . History of kidney stones   . Hyperlipidemia   . Hypertension    no meds  . Pneumonia    1 time    Past Surgical History:  Procedure Laterality Date  . DILATION AND CURETTAGE OF UTERUS     x3  . DILATION AND EVACUATION    . EPIGASTRIC HERNIA REPAIR N/A 03/23/2018   Procedure: PLICATION OF DIAPHRAGM;  Surgeon: Loreli Slot, MD;  Location[facility] OR;  Service: Thoracic;  Laterality: N/A;  . IR THORACENTESIS ASP PLEURAL SPACE W/IMG GUIDE  04/19/2018  . VIDEO ASSISTED THORACOSCOPY (VATS)/THOROCOTOMY Right 03/23/2018   Procedure: VIDEO ASSISTED THORACOSCOPY;  Surgeon: Loreli Slot, MD;  Location: [facility] OR;  Service: Thoracic;  Laterality: Right;    Family History  Problem Relation Age of Onset  . Breast cancer Mother 42  . Lung cancer Mother   . Hypertension Mother   . Diabetes Mother   . Kidney disease Mother   . Hyperlipidemia Mother   . Congestive Heart Failure Mother   . Colon polyps Mother        "multiple"  . Breast cancer Maternal Aunt        dx 50+  . Diabetes Maternal Aunt   . Hyperlipidemia Maternal Aunt   . Heart disease Maternal Aunt   . Hypertension Maternal Aunt   . Stroke Maternal Aunt   . Colon cancer  Maternal Aunt        dx 50+  . Colon polyps Maternal Aunt        multiple  . Hyperlipidemia Brother   . Hypertension Brother   . Diabetes Brother   . Colon cancer Maternal Uncle        dx 50+  . Diabetes Maternal Uncle   . Hyperlipidemia Maternal Uncle   . Heart disease Maternal Uncle   . Hypertension Maternal Uncle   . Stroke Maternal Uncle   . Hemochromatosis Paternal Uncle   . Heart disease Maternal Grandmother        Needed pacemaker  . Polycystic ovary syndrome Paternal Aunt   . Colon cancer Maternal Uncle        dx 50+  . Breast cancer Cousin        double mastectomy  . Prostate cancer Cousin   . Prostate cancer Cousin   . Pancreatic cancer Maternal Aunt        dx 50+  . Throat cancer Maternal Aunt        dx 50+  . Colon cancer Maternal Aunt        dx 50+  . Ovarian cancer Paternal Aunt   . Cervical cancer Paternal Aunt   . Prostate  cancer Maternal Uncle        dx 50+    Social History:  reports that she has never smoked. She has never used smokeless tobacco. She reports previous alcohol use. She reports that she does not use drugs.  Allergies:  Allergies  Allergen Reactions  . Doxycycline Shortness Of Breath and Anxiety  . Erythromycin Nausea Only and Other (See Comments)    Stomach cramping (can take Z Pak)  . Flexeril [Cyclobenzaprine Hcl] Other (See Comments)    "don't like the way I feel"  . Metformin And Related Other (See Comments)    GI Upset    Medications Prior to Admission  Medication Sig Dispense Refill Last Dose  . albuterol (VENTOLIN HFA) 108 (90 Base) MCG/ACT inhaler INHALE 2 PUFFS INTO THE LUNGS EVERY 4 (FOUR) HOURS AS NEEDED FOR WHEEZING OR SHORTNESS OF BREATH. 18 g 2 Past Month at Unknown time  . aspirin 81 MG tablet Take 81 mg by mouth daily.   Past Week at Unknown time  . baclofen (LIORESAL) 10 MG tablet TAKE 1/2 TO 1 TABLET 1 OR 2 X /DAY AS NEEDED FOR MUSCLE SPASMS 180 tablet 1 10/17/2019 at Unknown time  . buPROPion (WELLBUTRIN XL) 300  MG 24 hr tablet Take 1 tablet every Morning for Mood, Focus & Concentration 90 tablet 3 10/18/2019 at 0745  . Cholecalciferol (VITAMIN D) 125 MCG (5000 UT) CAPS Take 5,000 Units by mouth daily.    Past Week at Unknown time  . escitalopram (LEXAPRO) 20 MG tablet Take 1 tablet Daily for Mood 90 tablet 3 10/17/2019 at Unknown time  . Eszopiclone 3 MG TABS Take immediately before bedtime 30 tablet 3 10/17/2019 at Unknown time  . fexofenadine (ALLEGRA) 180 MG tablet Take 180 mg by mouth daily as needed for allergies or rhinitis. Alternates with zyrtec when Zyrtec seems to not be as effective   Past Month at Unknown time  . fluocinolone (SYNALAR) 0.025 % ointment APPLY TO AFFECTED AREA TWICE A DAY (Patient taking differently: as needed. ) 30 g 0 Past Month at Unknown time  . fluocinonide (LIDEX) 0.05 % external solution Apply 1 application topically 2 (two) times daily. (Patient taking differently: Apply 1 application topically as needed. ) 60 mL 1 Past Month at Unknown time  . fluticasone (FLONASE) 50 MCG/ACT nasal spray Place 2 sprays into both nostrils daily. 16 g 2 10/17/2019 at Unknown time  . levocetirizine (XYZAL) 5 MG tablet Take 5 mg by mouth at bedtime.   10/17/2019 at Unknown time  . MELATONIN PO Take 5 mg by mouth at bedtime.    10/17/2019 at Unknown time  . meloxicam (MOBIC) 15 MG tablet Take 1/2 to 1 tablet Daily for Pain & Inflammation  (Limit to 4-5 tabs /week Tto Avoid Kidney Damage) 90 tablet 3 Past Week at Unknown time  . montelukast (SINGULAIR) 10 MG tablet Take    1 tablet     Daily     for Allergies 90 tablet 0 10/17/2019 at Unknown time  . Multiple Vitamin (MULTIVITAMIN) tablet Take 1 tablet by mouth daily.   Past Week at Unknown time  . nystatin cream (MYCOSTATIN) Apply 1 application topically 2 (two) times daily. (Patient taking differently: Apply 1 application topically 2 (two) times daily as needed for dry skin. ) 30 g 1 Past Month at Unknown time  . omeprazole (PRILOSEC) 40 MG capsule  TAKE 1 CAPSULE BY MOUTH EVERY DAY 90 capsule 3 10/17/2019 at Unknown time  . rosuvastatin (CRESTOR)  5 MG tablet TAKE 1 TABLET BY MOUTH EVERYDAY AT BEDTIME 90 tablet 3 Past Week at Unknown time  . telmisartan (MICARDIS) 40 MG tablet TAKE 1 TABLET DAILY FOR BLOOD PRESSURE 90 tablet 3 10/18/2019 at 0745  . triamcinolone ointment (KENALOG) 0.1 % Apply 1 application topically 2 (two) times daily. (Patient taking differently: Apply 1 application topically daily as needed (rash). ) 80 g 1 Past Month at Unknown time  . vitamin C (ASCORBIC ACID) 500 MG tablet Take 500 mg by mouth daily.   Past Week at Unknown time  . vitamin E 600 UNIT capsule Take 600 Units by mouth daily.   Past Week at Unknown time    REVIEW OF SYSTEMS: A ROS was performed and pertinent positives and negatives are included in the history.  GENERAL: No fevers or chills. HEENT: No change in vision, no earache, sore throat or sinus congestion. NECK: No pain or stiffness. CARDIOVASCULAR: No chest pain or pressure. No palpitations. PULMONARY: No shortness of breath, cough or wheeze. GASTROINTESTINAL: No abdominal pain, nausea, vomiting or diarrhea, melena or bright red blood per rectum. GENITOURINARY: No urinary frequency, urgency, hesitancy or dysuria. MUSCULOSKELETAL: No joint or muscle pain, no back pain, no recent trauma. DERMATOLOGIC: No rash, no itching, no lesions. ENDOCRINE: No polyuria, polydipsia, no heat or cold intolerance. No recent change in weight. HEMATOLOGICAL: No anemia or easy bruising or bleeding. NEUROLOGIC: No headache, seizures, numbness, tingling or weakness. PSYCHIATRIC: No depression, no loss of interest in normal activity or change in sleep pattern.     Blood pressure 124/79, pulse 73, temperature 99 F (37.2 C), temperature source Oral, resp. rate 18, height 5\' 1"  (1.549 m), weight 98.9 kg, last menstrual period 09/20/2019, SpO2 98 %.  Physical Exam:  See office notes  Covid Neg  SonoHysto 09/05/2019: The initial  transvaginal ultrasound demonstrated the following:  T/V images. Limited ultrasound today due to recent ultrasound. Endometrial lining is thickened at 7.52 mm.  The speculum was inserted and the cervix cleansed with Betadine solution after confirming that patient has no allergies. A small sonohysterography catheterwas utilized. Insertion was facilitated with ring forceps, using a spear-like motion the catheter was inserted to the fundus of the uterus. The speculum is then removed carefully to avoid dislodging the catheter. The catheter was flushed with sterile saline delete prior to insertion to rid it of small amounts of air.the sterile saline solution was infused into the uterine cavity as a vaginal ultrasound probe was then placed in the vagina for full visualization of the uterine cavity from a transvaginal approach. The following was noted:  Sonohysterogram performed using 0.9% normal saline. After normal saline was introduced into the intra uterine cavity, a filling defect was seen measured at 1.6 x 0.6 cm.  The catheter was then removed after retrieving some of the saline from the intrauterine cavity. An endometrial biopsywas notdone. Patient tolerated procedure well. She had received a tablet of Aleve for discomfort.    Assessment/Plan:  49 y.o. G1P0A1  1. Menometrorrhagia Menometrorrhagia with an endometrial polyp by sonohysterogram. Decision to proceed with hysteroscopy with MyoSure excision and dilation and curettage.  Preop preparation, surgery and risks as well as postop expectations and precautions thoroughly reviewed with patient.  Patient voiced understanding and agreement with plan.  Patient would like to be started on a birth control pill at the 3-week postop visit to further control her cycle.  Will probably start on a low dosage continuous birth control pill.  2. Endometrial polyp As above.  Patient was counseled as to the risk of surgery  to include the following:  1. Infection (prohylactic antibiotics will be administered)  2. DVT/Pulmonary Embolism (prophylactic pneumo compression stockings will be used)  3.Trauma to internal organs requiring additional surgical procedure to repair any injury to internal organs requiring perhaps additional hospitalization days.  4.Hemmorhage requiring transfusion and blood products which carry risks such as anaphylactic reaction, hepatitis and AIDS  Patient had received literature information on the procedure scheduled and all her questions were answered and fully accepts all risk.   [place] Amyri Frenz 10/18/2019, 11:04 AM

## 2019-10-21 ENCOUNTER — Encounter (HOSPITAL_BASED_OUTPATIENT_CLINIC_OR_DEPARTMENT_OTHER): Payer: Self-pay | Admitting: Obstetrics & Gynecology

## 2019-10-21 LAB — SURGICAL PATHOLOGY

## 2019-10-22 DIAGNOSIS — G4733 Obstructive sleep apnea (adult) (pediatric): Secondary | ICD-10-CM | POA: Diagnosis not present

## 2019-11-04 ENCOUNTER — Encounter: Payer: Self-pay | Admitting: Thoracic Surgery (Cardiothoracic Vascular Surgery)

## 2019-11-12 DIAGNOSIS — G4733 Obstructive sleep apnea (adult) (pediatric): Secondary | ICD-10-CM | POA: Diagnosis not present

## 2019-11-12 NOTE — Telephone Encounter (Signed)
Had D&C, Hysteroscopy 10/18/19.

## 2019-11-14 ENCOUNTER — Other Ambulatory Visit: Payer: Self-pay | Admitting: Internal Medicine

## 2019-11-14 DIAGNOSIS — M5441 Lumbago with sciatica, right side: Secondary | ICD-10-CM

## 2019-11-21 DIAGNOSIS — G4733 Obstructive sleep apnea (adult) (pediatric): Secondary | ICD-10-CM | POA: Diagnosis not present

## 2019-11-21 DIAGNOSIS — G4700 Insomnia, unspecified: Secondary | ICD-10-CM

## 2019-11-22 MED ORDER — ESZOPICLONE 3 MG PO TABS: ORAL_TABLET | ORAL | 3 refills | Status: DC

## 2019-12-04 ENCOUNTER — Other Ambulatory Visit: Payer: Self-pay | Admitting: Internal Medicine

## 2019-12-04 DIAGNOSIS — F330 Major depressive disorder, recurrent, mild: Secondary | ICD-10-CM

## 2019-12-08 IMAGING — CR DG CHEST 2V
2 series · 2 of 2 positions shown · non-contrast
Comparison: CT chest 11/30/2017.  PA and lateral chest 10/29/2015.

CLINICAL DATA: Preoperative examination. Patient for repair of a
diaphragmatic hernia.

EXAM:
CHEST - 2 VIEW

[w chest pa]
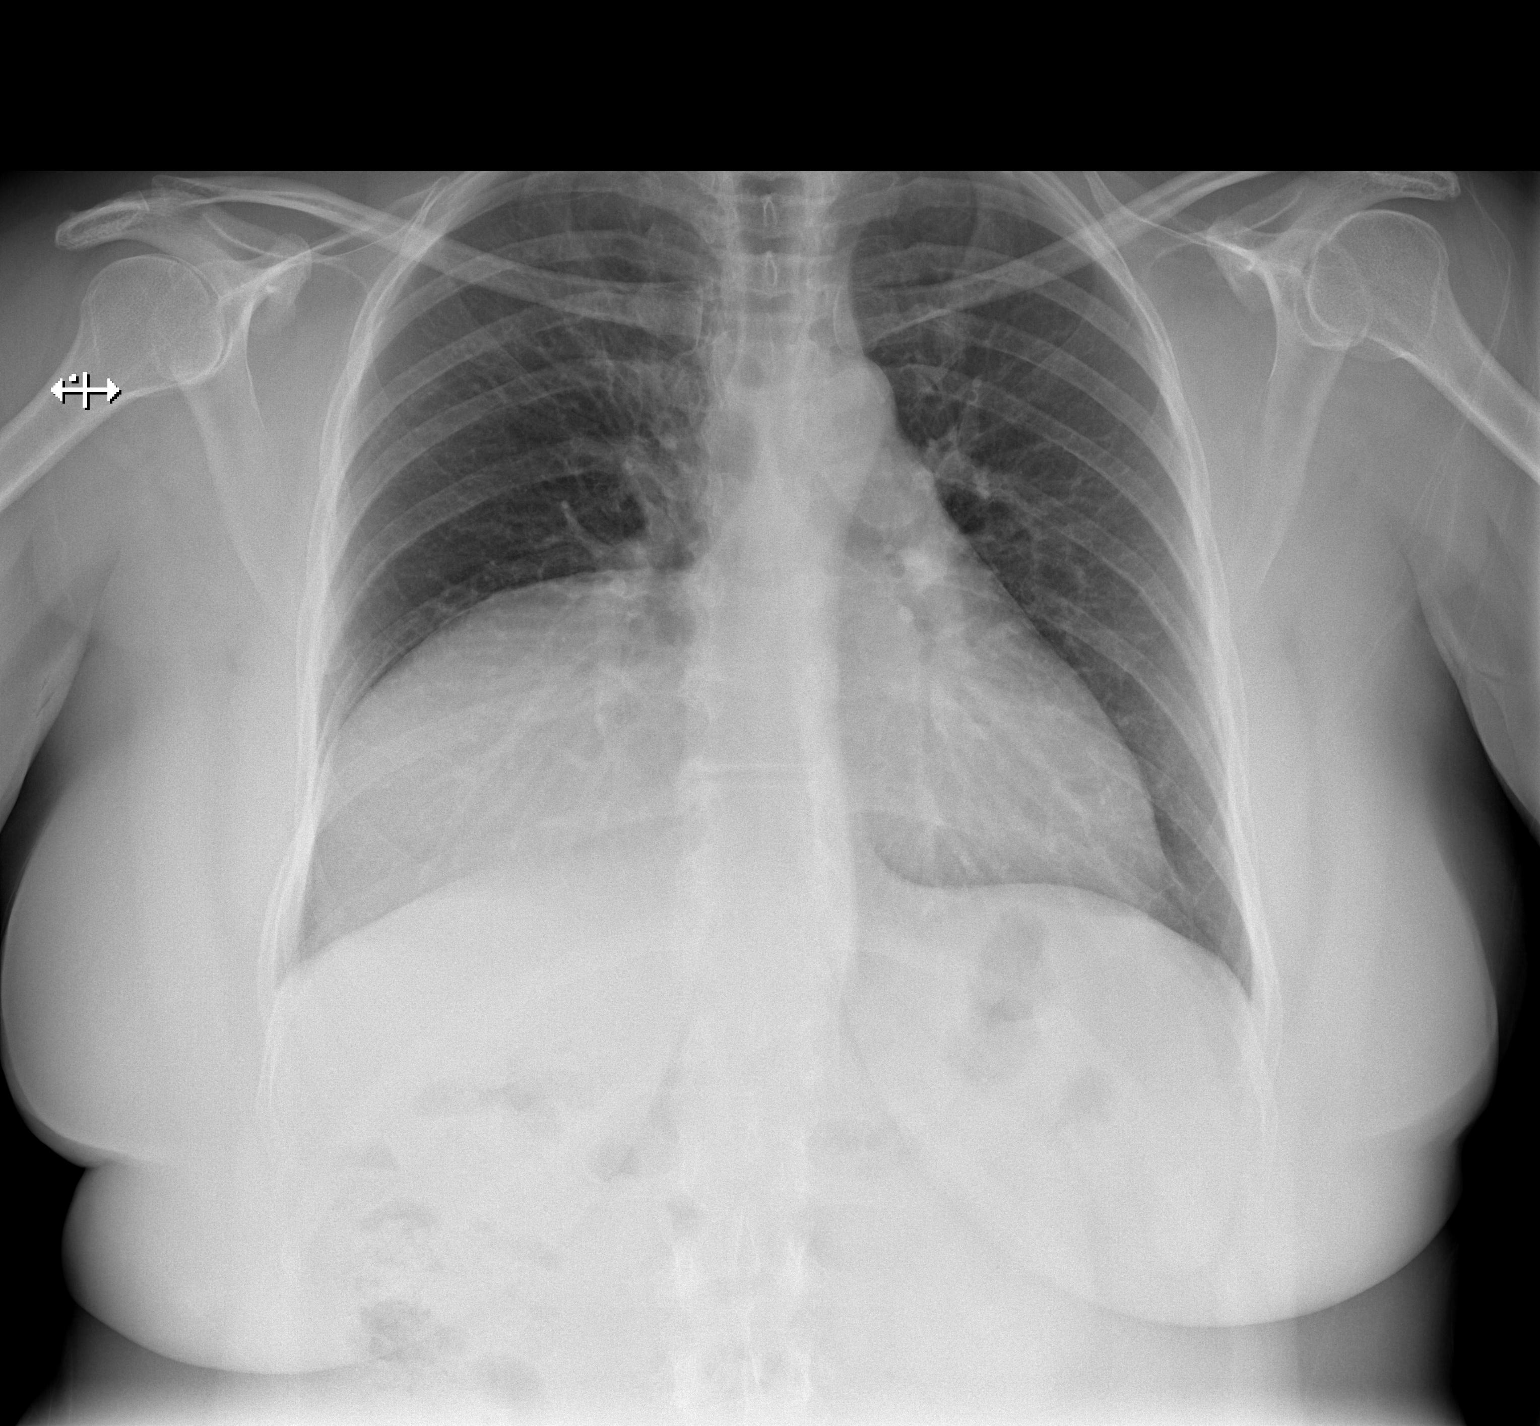

[w chest lat]
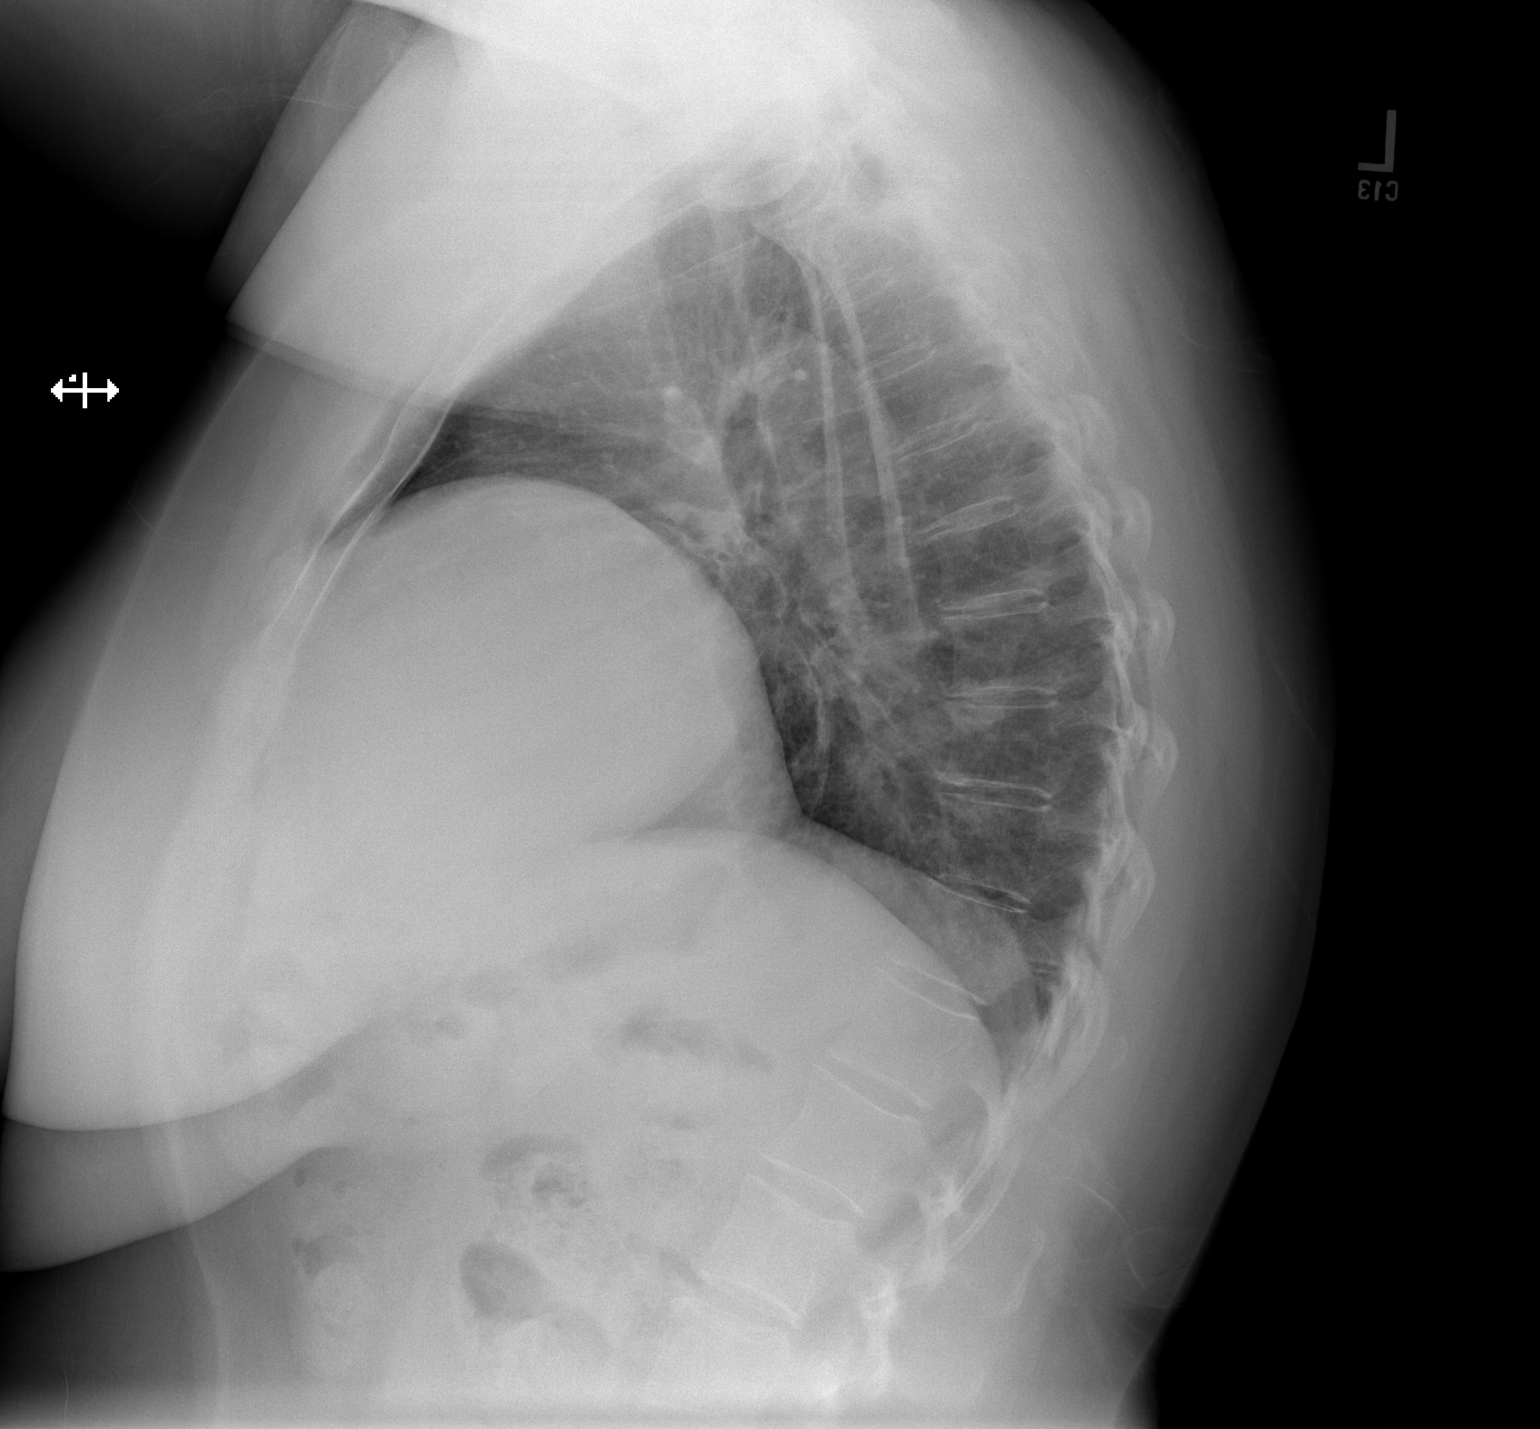

[2 of 2 positions shown; findings below may reference images not displayed]

FINDINGS: Marked eventration of the right hemidiaphragm with displacement of
the liver into the right lower chest appears unchanged. Lungs are
clear. Heart size is normal. No pneumothorax or pleural effusion. No
acute or focal bony abnormality.
IMPRESSION: No acute disease.

No change in marked eventration of the right hemidiaphragm.

## 2019-12-12 IMAGING — DX DG CHEST 1V PORT
1 series · 1 of 1 positions shown · non-contrast
Comparison: 03/23/2018 and 03/21/2018.

CLINICAL DATA: Diaphragmatic eventration.

EXAM:
PORTABLE CHEST 1 VIEW

[chest]
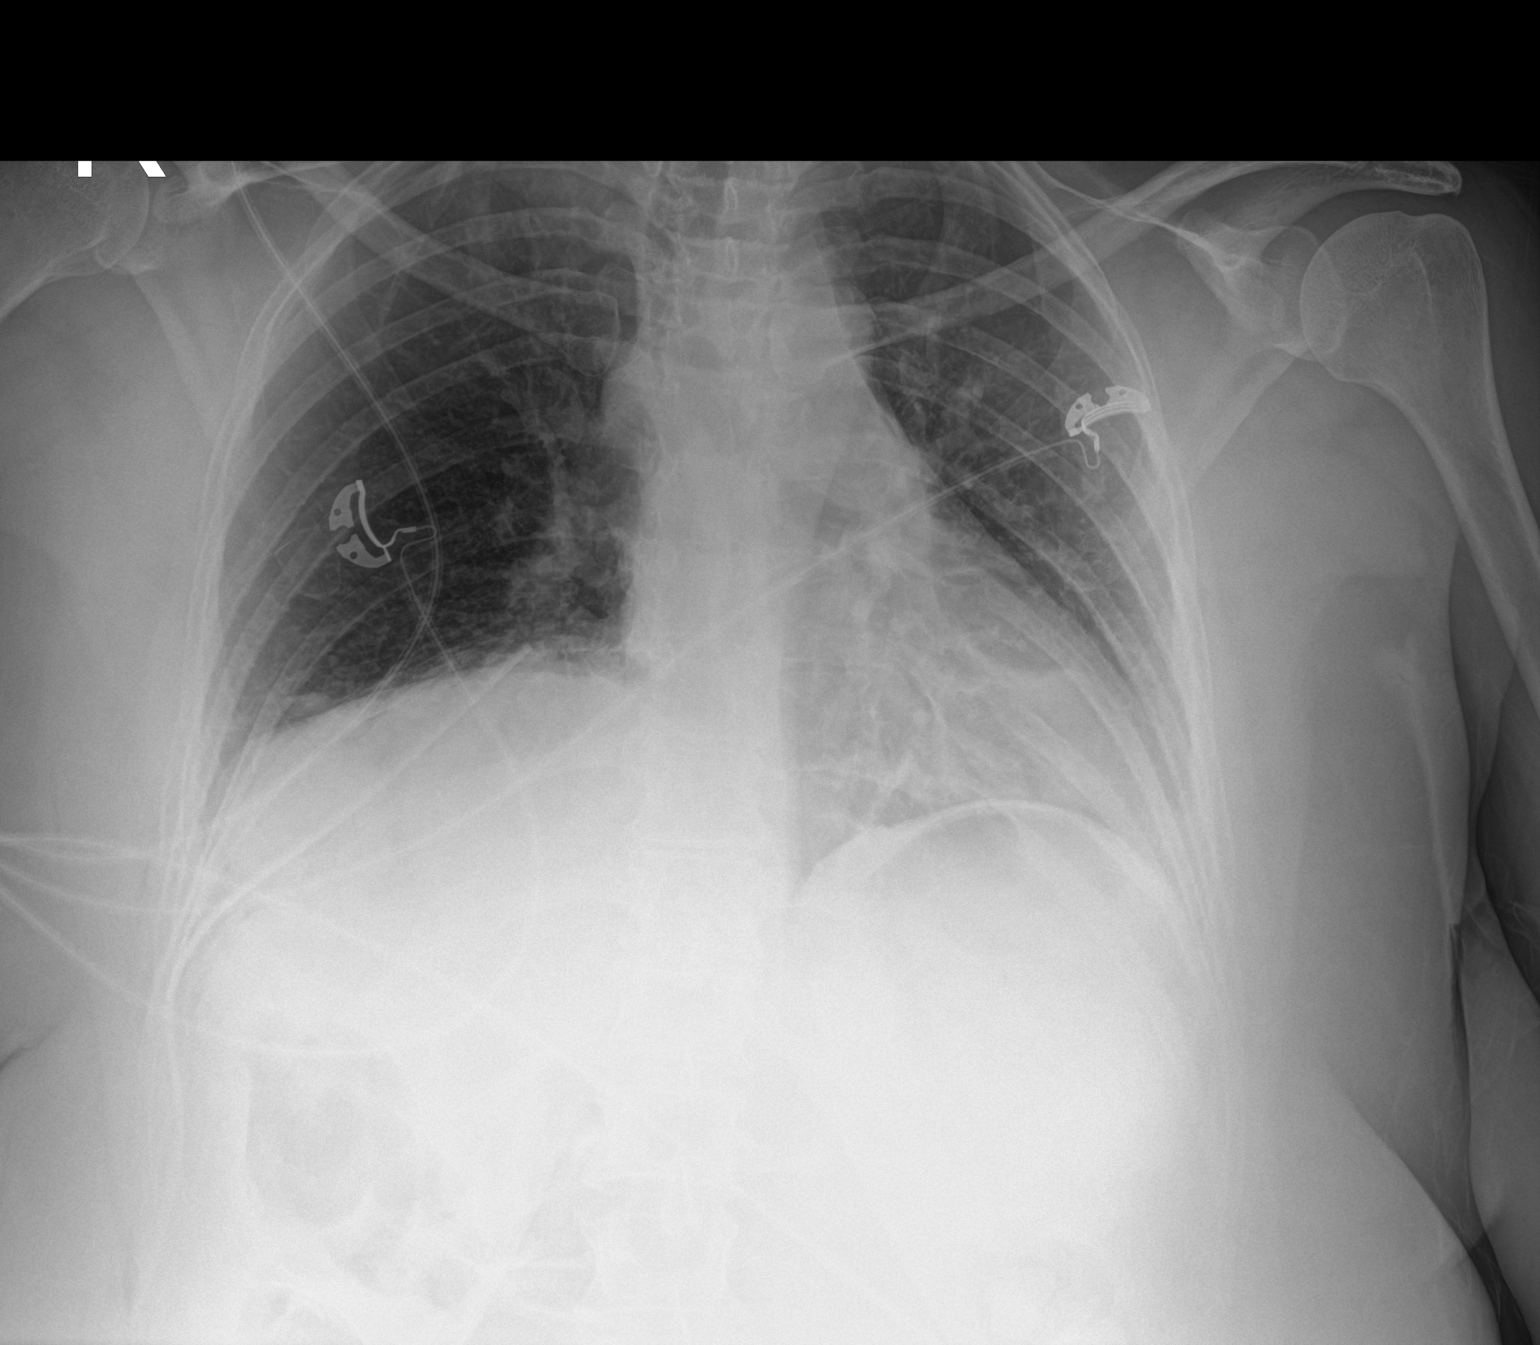

[1 of 1 positions shown; findings below may reference images not displayed]

FINDINGS: Right-sided chest tube has been repositioned or retracted. Less than
5% right apical pneumothorax is again identified. Midline trachea.
Normal heart size. No pleural fluid. Mild right hemidiaphragm
elevation. Similar bibasilar atelectasis.
IMPRESSION: 1. Right-sided chest tube remaining in place with similar less than
5% right apical pneumothorax.
2. Similar bibasilar atelectasis.

## 2019-12-13 IMAGING — DX DG CHEST 1V PORT
1 series · 1 of 1 positions shown · non-contrast
Comparison: Radiograph March 25, 2018.

CLINICAL DATA: Removal of chest tube.

EXAM:
PORTABLE CHEST 1 VIEW

[chest ap]
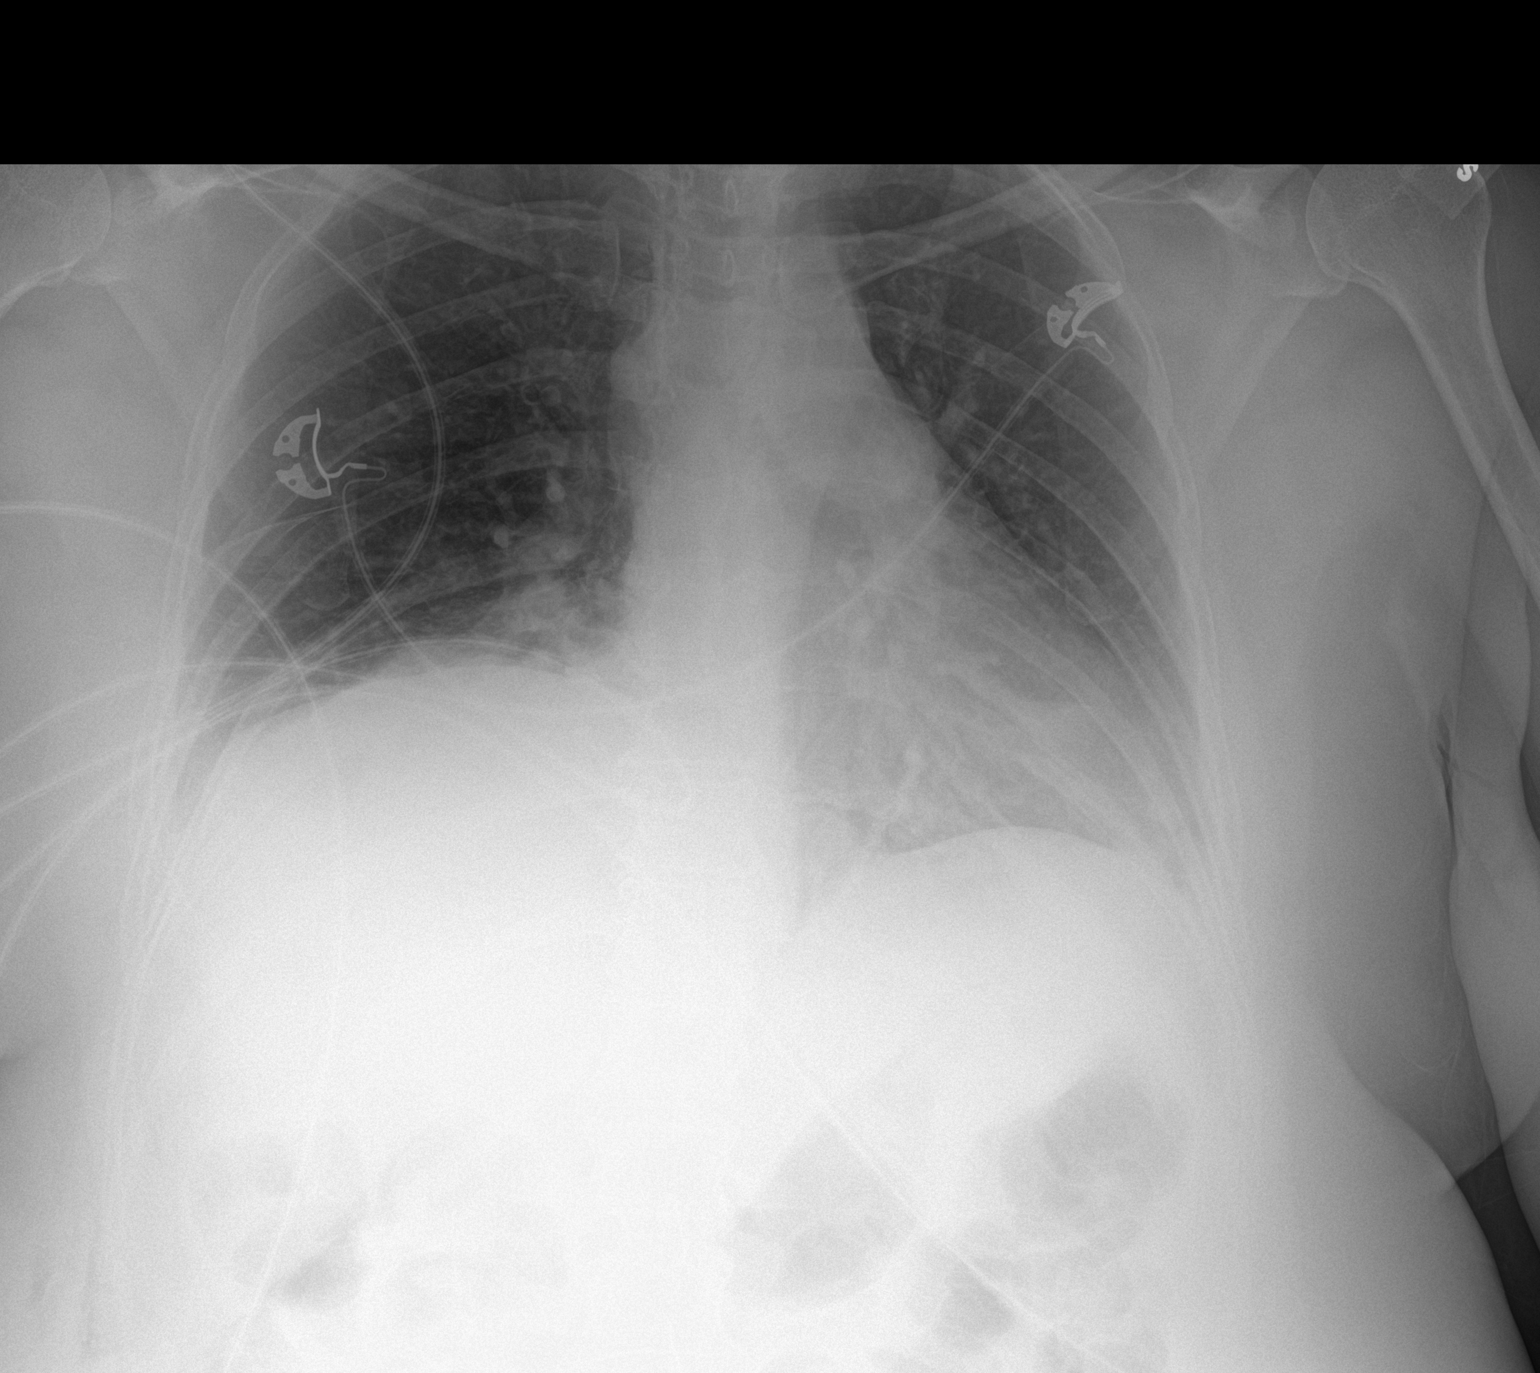

[1 of 1 positions shown; findings below may reference images not displayed]

FINDINGS: The heart size and mediastinal contours are within normal limits.
Right-sided chest tube has been removed. Stable minimal right apical
pneumothorax is noted. Left lung is clear. Stable right basilar
subsegmental atelectasis is noted.. The visualized skeletal
structures are unremarkable.
IMPRESSION: Stable minimal right apical pneumothorax is noted status post
right-sided chest tube removal. Stable right basilar subsegmental
atelectasis.

## 2019-12-22 DIAGNOSIS — G4733 Obstructive sleep apnea (adult) (pediatric): Secondary | ICD-10-CM | POA: Diagnosis not present

## 2020-01-20 ENCOUNTER — Ambulatory Visit (INDEPENDENT_AMBULATORY_CARE_PROVIDER_SITE_OTHER): Payer: BC Managed Care – PPO | Admitting: Obstetrics & Gynecology

## 2020-01-20 ENCOUNTER — Other Ambulatory Visit: Payer: Self-pay

## 2020-01-20 ENCOUNTER — Encounter: Payer: Self-pay | Admitting: Obstetrics & Gynecology

## 2020-01-20 VITALS — BP 128/82 | Ht 61.25 in | Wt 221.0 lb

## 2020-01-20 DIAGNOSIS — Z6841 Body Mass Index (BMI) 40.0 and over, adult: Secondary | ICD-10-CM

## 2020-01-20 DIAGNOSIS — N920 Excessive and frequent menstruation with regular cycle: Secondary | ICD-10-CM

## 2020-01-20 DIAGNOSIS — B372 Candidiasis of skin and nail: Secondary | ICD-10-CM | POA: Diagnosis not present

## 2020-01-20 DIAGNOSIS — Z01419 Encounter for gynecological examination (general) (routine) without abnormal findings: Secondary | ICD-10-CM

## 2020-01-20 DIAGNOSIS — Z23 Encounter for immunization: Secondary | ICD-10-CM

## 2020-01-20 MED ORDER — NORETHINDRONE 0.35 MG PO TABS: 1.0000 | ORAL_TABLET | Freq: Every day | ORAL | 4 refills | Status: DC

## 2020-01-20 MED ORDER — NYSTATIN 100000 UNIT/GM EX POWD: 1.0000 "application " | Freq: Two times a day (BID) | CUTANEOUS | 4 refills | Status: DC

## 2020-01-20 NOTE — Progress Notes (Signed)
Samantha Greene [birth year] [id number]   History:    49 y.o. G1P0A1L0 Married  [id number] presenting for annual gyn exam   HPI:  Menses regular every month with moderate flow x 7 days and light flow 1 day before and 2 days after.  This is improved x HSC/Excision/D+C 10/2019.  Hb 12.8 on 10/15/2019. No breakthrough bleeding. No pelvic pain. Rarely sexually active, using condoms. Urine and bowel movements normal. Breasts normal. Mother with Breast Cancer. Genetic screening negative.  Liver herniated in chest cavity, surgery successful in 2020.  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 Body mass index 41.42.  Planning to increase fitness activities. Health labs with family physician.   Past medical history,surgical history, family history and social history were all reviewed and documented in the EPIC chart.  Gynecologic History Patient's last menstrual period was 01/08/2020.  Obstetric History OB History  Gravida Para Term Preterm AB Living  1 0     0 0  SAB IAB Ectopic Multiple Live Births      0        # Outcome Date GA Lbr Len/2nd Weight Sex Delivery Anes PTL Lv  1 Gravida              ROS: A ROS was performed and pertinent positives and negatives are included in the history.  GENERAL: No fevers or chills. HEENT: No change in vision, no earache, sore throat or sinus congestion. NECK: No pain or stiffness. CARDIOVASCULAR: No chest pain or pressure. No palpitations. PULMONARY: No shortness of breath, cough or wheeze. GASTROINTESTINAL: No abdominal pain, nausea, vomiting or diarrhea, melena or bright red blood per rectum. GENITOURINARY: No urinary frequency, urgency, hesitancy or dysuria. MUSCULOSKELETAL: No joint or muscle pain, no back pain, no recent trauma. DERMATOLOGIC: No rash, no itching, no lesions. ENDOCRINE: No polyuria, polydipsia, no heat or cold intolerance. No recent change in weight. HEMATOLOGICAL: No anemia or easy bruising or bleeding. NEUROLOGIC: No  headache, seizures, numbness, tingling or weakness. PSYCHIATRIC: No depression, no loss of interest in normal activity or change in sleep pattern.     Exam:   BP 128/82   Ht 5' 1.25" (1.556 m)   Wt 221 lb (100.2 kg)   LMP 01/08/2020   BMI 41.42 kg/m   Body mass index is 41.42 kg/m.  General appearance : Well developed well nourished female. No acute distress HEENT: Eyes: no retinal hemorrhage or exudates,  Neck supple, trachea midline, no carotid bruits, no thyroidmegaly Lungs: Clear to auscultation, no rhonchi or wheezes, or rib retractions  Heart: Regular rate and rhythm, no murmurs or gallops Breast:Examined in sitting and supine position were symmetrical in appearance, no palpable masses or tenderness,  no skin retraction, no nipple inversion, no nipple discharge, no skin discoloration, no axillary or supraclavicular lymphadenopathy Abdomen: no palpable masses or tenderness, no rebound or guarding Extremities: no edema or skin discoloration or tenderness  Pelvic: Vulva: Normal             Vagina: No gross lesions or discharge  Cervix: No gross lesions or discharge.  Pap reflex done.  Uterus  AV, normal size, shape and consistency, non-tender and mobile  Adnexa  Without masses or tenderness  Anus: Normal   Assessment/Plan:  49 y.o. female for annual exam   1. Encounter for routine gynecological examination with Papanicolaou smear of cervix Normal gynecologic exam.  Pap reflex done.  Breast exam normal.  Screening mammogram May 2021 was negative.  Will do a colonoscopy next year  at age 47.  Health labs with family physician.  2. Menorrhagia with regular cycle Menstrual periods are improved post hysteroscopy with excision of polyp and D&C in September 2021, but still heavy.  Decision to start on the Progestin pill.  No CI.  Usage reviewed and prescription sent to pharmacy.  3. Yeast infection of the skin Will treat with Nystatin powder.  Prescription sent to pharmacy.  4.  Class 3 severe obesity due to excess calories with serious comorbidity and body mass index (BMI) of 40.0 to 44.9 in adult [facility]) Low calorie/carb diet reviewed.  Aerobic activities 5 times a week and light weight lifting every 2 days.  5. Flu vaccine need Flu shot given.  Other orders - Flu Vaccine QUAD 36+ mos IM (Fluarix, Quad PF) - norethindrone (MICRONOR) 0.35 MG tablet; Take 1 tablet (0.35 mg total) by mouth daily. - nystatin (MYCOSTATIN/NYSTOP) powder; Apply 1 application topically 2 (two) times daily.  Genia Del MD, 2:26 PM 01/20/2020

## 2020-01-21 DIAGNOSIS — G4733 Obstructive sleep apnea (adult) (pediatric): Secondary | ICD-10-CM | POA: Diagnosis not present

## 2020-01-22 DIAGNOSIS — Z01419 Encounter for gynecological examination (general) (routine) without abnormal findings: Secondary | ICD-10-CM | POA: Diagnosis not present

## 2020-01-22 NOTE — Addendum Note (Signed)
Addended by: Tito Dine on: 01/22/2020 09:29 AM   Modules accepted: Orders

## 2020-01-27 LAB — PAP IG W/ RFLX HPV ASCU

## 2020-01-30 ENCOUNTER — Other Ambulatory Visit: Payer: Self-pay | Admitting: Internal Medicine

## 2020-02-13 DIAGNOSIS — G4733 Obstructive sleep apnea (adult) (pediatric): Secondary | ICD-10-CM | POA: Diagnosis not present

## 2020-02-18 ENCOUNTER — Ambulatory Visit: Payer: BC Managed Care – PPO | Admitting: Orthopaedic Surgery

## 2020-02-20 ENCOUNTER — Other Ambulatory Visit: Payer: Self-pay | Admitting: Internal Medicine

## 2020-02-20 DIAGNOSIS — F330 Major depressive disorder, recurrent, mild: Secondary | ICD-10-CM

## 2020-02-20 DIAGNOSIS — M5441 Lumbago with sciatica, right side: Secondary | ICD-10-CM

## 2020-02-21 DIAGNOSIS — G4733 Obstructive sleep apnea (adult) (pediatric): Secondary | ICD-10-CM | POA: Diagnosis not present

## 2020-02-27 DIAGNOSIS — B078 Other viral warts: Secondary | ICD-10-CM | POA: Diagnosis not present

## 2020-02-27 DIAGNOSIS — L400 Psoriasis vulgaris: Secondary | ICD-10-CM | POA: Diagnosis not present

## 2020-02-27 DIAGNOSIS — L821 Other seborrheic keratosis: Secondary | ICD-10-CM | POA: Diagnosis not present

## 2020-03-16 IMAGING — CR CHEST - 2 VIEW
2 series · 2 of 2 positions shown · non-contrast
Comparison: 05/15/2018

CLINICAL DATA: Acute right chest pain

EXAM:
CHEST - 2 VIEW

[w chest pa]
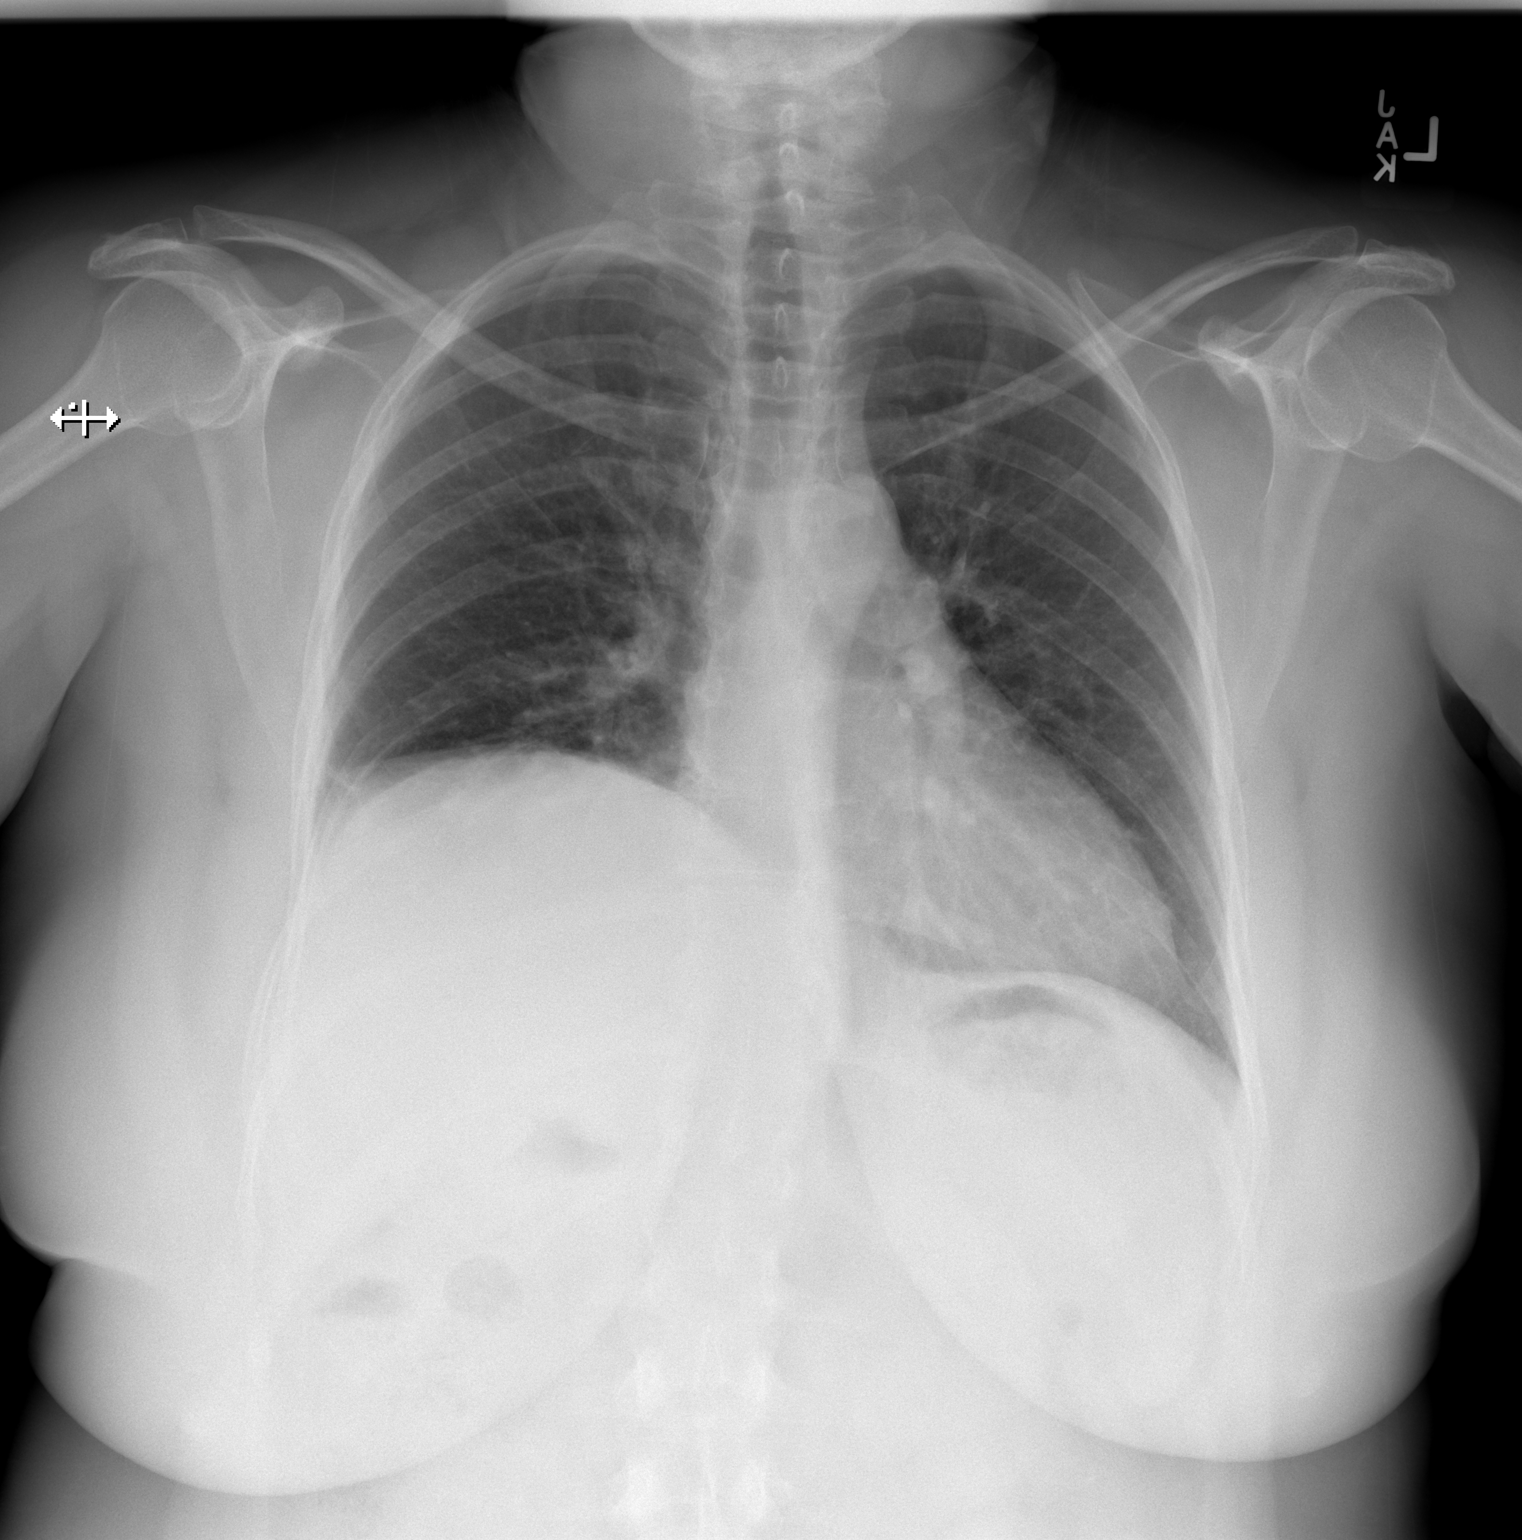

[w chest lat]
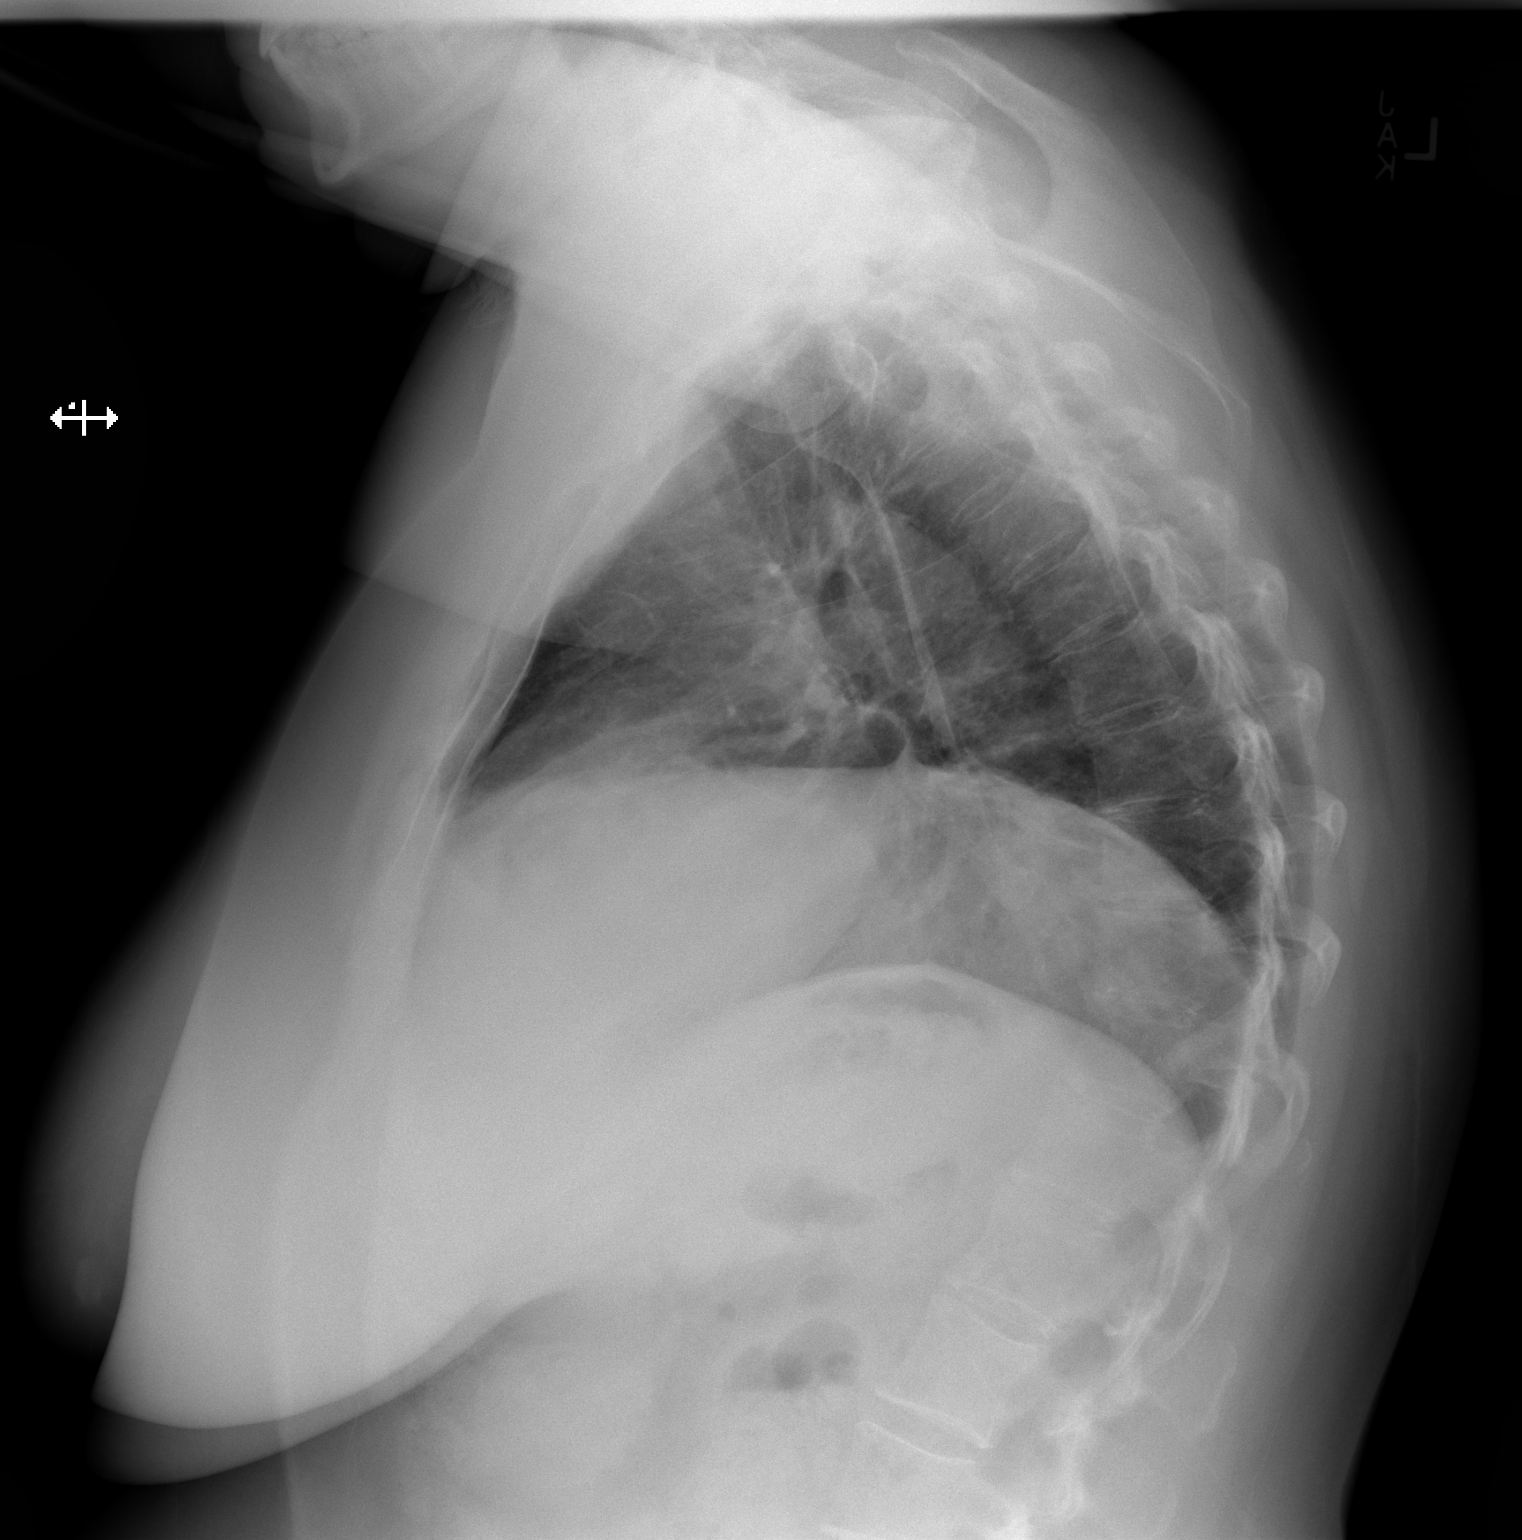

[2 of 2 positions shown; findings below may reference images not displayed]

FINDINGS: Right hemidiaphragm remains elevated with volume loss in the right
chest and chronic right base atelectasis. No effusion or
pneumothorax. Left lung remains clear. Normal heart size and
vascularity. Trachea midline. Nonobstructive bowel gas pattern.
Thoracic spondylosis noted with mild increased kyphotic curvature.
Trachea is midline. Aorta atherosclerotic.
IMPRESSION: Stable chronic right hemidiaphragm elevation with right base
atelectasis and volume loss.

No interval change or superimposed acute process by plain
radiography.

## 2020-03-17 ENCOUNTER — Encounter: Payer: Self-pay | Admitting: Adult Health Nurse Practitioner

## 2020-03-17 ENCOUNTER — Ambulatory Visit: Payer: BC Managed Care – PPO | Admitting: Adult Health Nurse Practitioner

## 2020-03-17 ENCOUNTER — Other Ambulatory Visit: Payer: Self-pay

## 2020-03-17 VITALS — BP 120/80 | HR 68 | Temp 97.3°F | Ht 61.0 in | Wt 225.0 lb

## 2020-03-17 DIAGNOSIS — G4700 Insomnia, unspecified: Secondary | ICD-10-CM

## 2020-03-17 DIAGNOSIS — D649 Anemia, unspecified: Secondary | ICD-10-CM

## 2020-03-17 DIAGNOSIS — Z1389 Encounter for screening for other disorder: Secondary | ICD-10-CM | POA: Diagnosis not present

## 2020-03-17 DIAGNOSIS — Z0000 Encounter for general adult medical examination without abnormal findings: Secondary | ICD-10-CM

## 2020-03-17 DIAGNOSIS — Z130 Encounter for screening for diseases of the blood and blood-forming organs and certain disorders involving the immune mechanism: Secondary | ICD-10-CM | POA: Diagnosis not present

## 2020-03-17 DIAGNOSIS — E559 Vitamin D deficiency, unspecified: Secondary | ICD-10-CM | POA: Diagnosis not present

## 2020-03-17 DIAGNOSIS — L404 Guttate psoriasis: Secondary | ICD-10-CM

## 2020-03-17 DIAGNOSIS — Z1321 Encounter for screening for nutritional disorder: Secondary | ICD-10-CM

## 2020-03-17 DIAGNOSIS — E782 Mixed hyperlipidemia: Secondary | ICD-10-CM

## 2020-03-17 DIAGNOSIS — Z13220 Encounter for screening for lipoid disorders: Secondary | ICD-10-CM | POA: Diagnosis not present

## 2020-03-17 DIAGNOSIS — Z0001 Encounter for general adult medical examination with abnormal findings: Secondary | ICD-10-CM

## 2020-03-17 DIAGNOSIS — Z1329 Encounter for screening for other suspected endocrine disorder: Secondary | ICD-10-CM | POA: Diagnosis not present

## 2020-03-17 DIAGNOSIS — R7309 Other abnormal glucose: Secondary | ICD-10-CM

## 2020-03-17 DIAGNOSIS — F330 Major depressive disorder, recurrent, mild: Secondary | ICD-10-CM

## 2020-03-17 DIAGNOSIS — G4733 Obstructive sleep apnea (adult) (pediatric): Secondary | ICD-10-CM

## 2020-03-17 DIAGNOSIS — Z136 Encounter for screening for cardiovascular disorders: Secondary | ICD-10-CM

## 2020-03-17 DIAGNOSIS — I10 Essential (primary) hypertension: Secondary | ICD-10-CM | POA: Diagnosis not present

## 2020-03-17 DIAGNOSIS — Z131 Encounter for screening for diabetes mellitus: Secondary | ICD-10-CM | POA: Diagnosis not present

## 2020-03-17 DIAGNOSIS — Z9889 Other specified postprocedural states: Secondary | ICD-10-CM

## 2020-03-17 DIAGNOSIS — Z79899 Other long term (current) drug therapy: Secondary | ICD-10-CM

## 2020-03-17 MED ORDER — DULOXETINE HCL 30 MG PO CPEP: 30.0000 mg | ORAL_CAPSULE | Freq: Every day | ORAL | 0 refills | Status: DC

## 2020-03-17 NOTE — Patient Instructions (Addendum)
  Start to wean escitalopram (lexapro), start taking half tablet for two weeks then every other day for one week.  THEN start Cymbalta 30mg  once a day for two weeks.  IF goes well then next week take two tablets (60mg ) daily.  The goal is Cymbalta 60mg  daily. Take this medication in the morning.  Continue taking the Wellbutrin 300mg .   Follow up appointment in 7 weeks.  Contact sooner if not going well or concerns.   GENERAL HEALTH GOALS  Know what a healthy weight is for you (roughly BMI <25) and aim to maintain this  Aim for 7+ servings of fruits and vegetables daily  70-80+ fluid ounces of water or unsweet tea for healthy kidneys  Limit to max 1 drink of alcohol per day; avoid smoking/tobacco  Limit animal fats in diet for cholesterol and heart health - choose grass fed whenever available  Avoid highly processed foods, and foods high in saturated/trans fats  Aim for low stress - take time to unwind and care for your mental health  Aim for 150 min of moderate intensity exercise weekly for heart health, and weights twice weekly for bone health  Aim for 7-9 hours of sleep daily

## 2020-03-17 NOTE — Progress Notes (Signed)
COMPLETE PHYSICAL   Assessment and Plan:  Encounter for general adult medical examination with abnormal findings Yearly  Essential hypertension - continue medications, DASH diet, exercise and monitor at home. Call if greater than 130/80.  -     CBC with Differential/Platelet -     COMPLETE METABOLIC PANEL WITH GFR -     TSH -     Urinalysis, Routine w reflex microscopic -     Microalbumin / creatinine urine ratio -     EKG 12-Lead  Mixed hyperlipidemia -     Lipid panel check lipids decrease fatty foods increase activity.   Morbid obesity (HCC) - follow up 3 months for progress monitoring - increase veggies, decrease carbs - long discussion about weight loss, diet, and exercise  Psoriasis, guttate Monitor  Vitamin D deficiency -     VITAMIN D 25 Hydroxy (Vit-D Deficiency, Fractures)  OSA on CPAP ? If working well- Patient still has fatigue- possible from medications, trying to cut back on those but with possible central sleep apnea, may want to try a Bipap or switch wellbutrin to nuvigil/adderall during the day- will message Dr. Golden Hurterohemier.   Mild episode of recurrent major depressive disorder (HCC) Continue wellbutrin XL 300mg  Taper lexapro then start Cymbalta. Printed instructions given to patient. To help with mood also improve pain management? Follow up in 4 weeks for this.  Insomnia, unspecified type -     Eszopiclone 3 MG TABS; one tab at bedtime as needed for insomnia - follow up neuro  Medication management Continued  Abnormal glucose -     Hemoglobin A1c   S/P Thoracotomy with Plication of Diaphragm -     AMB referral to pulmonary rehabilitation Improving, slowly Continue, follow up with Dr Sherene SiresWert PRN  Screening, anemia, deficiency, iron -     Iron,Total/Total Iron Binding Cap -     Vitamin B12  Contact office with any new or worsening symptoms   Discussed med's effects and SE's. Screening labs and tests as requested with regular follow-up as  recommended.  Further disposition pending results if labs check today. Discussed med's effects and SE's.   Over 30 minutes of face to face interview, exam, counseling, chart review, and critical decision making was performed.    Future Appointments  Date Time Provider Department Center  01/20/2021  2:30 PM Genia DelLavoie, Marie-Lyne, MD [facility] None  03/17/2021 10:00 AM Elder NegusMcClanahan, Anyra Kaufman, NP [facility] None    HPI  50 y.o. female  presents for a complete physical.  She reports that she is taking arthritis medicine, meloxicam PRN, not daily. She report breakthrough pain, joints, multiple.  Has history of depression   BMI is Body mass index is 42.51 kg/m., she is working on diet and exercise. Wt Readings from Last 3 Encounters:  03/17/20 225 lb (102.1 kg)  01/20/20 221 lb (100.2 kg)  10/18/19 218 lb (98.9 kg)   She is s/p VATS 03/23/2018, was still having SOB with exertion or bening over and some right sided pain with bending over- she was doing better with pulmonary rehab but has been taking care of her aunt who is [phone] but states they are doing better.  She continues to feel depressed and continues to cry multiple times a week.  She is on lexapro 20 mg and wellbutrin 300mg . She has taken prozac and sertraline in the past with adverse side effects.   She wears this daily and general fatigued.  And had a sleep study that showed severe OSA with AHI  37.5/h and possible central sleep apnea characteristics.  and is now on a CPAP x Nov, uses [employer]. States she is using it nightly, having trouble sleeping lately due to anxiety/grief and still having fatigue through the day. She is trying to cut back her gabapentin to 1 a day.   She completed pulmonary rehabilitation and no longer going.  She is not active.  No CP, no fever, chills, cough.  She is also asking about disability due to her SOB, also states since she was attacked by a dog, she has not been able to type the same, will get evaulated by  ortho that she is seeing for her neck pain.    Her blood pressure has been controlled at home, today their BP is BP: 120/80.  She does workout. She denies chest pain, shortness of breath, dizziness.   She is on cholesterol medication and denies myalgias. Her cholesterol is at goal. The cholesterol last visit was:  Lab Results  Component Value Date   CHOL 170 09/30/2019   HDL 62 09/30/2019   LDLCALC 75 09/30/2019   TRIG 248 (H) 09/30/2019   CHOLHDL 2.7 09/30/2019  . She has been working on diet and exercise for prediabetes, she is on bASA, she is on ACE/ARB and denies foot ulcerations, hyperglycemia, hypoglycemia , increased appetite, nausea, paresthesia of the feet, polydipsia, polyuria, visual disturbances, vomiting and weight loss. Last A1C in the office was:  Lab Results  Component Value Date   HGBA1C 5.5 06/17/2019   Patient is on Vitamin D supplement, she is on 5000 daily.   Lab Results  Component Value Date   VD25OH 47 06/17/2019     She has recurrent sinus injections, going to call Dr. Ezzard Standing to try get nasal surgery- has been 3 years.    Current Medications:   Current Outpatient Medications (Endocrine & Metabolic):  .  norethindrone (MICRONOR) 0.35 MG tablet, Take 1 tablet (0.35 mg total) by mouth daily.  Current Outpatient Medications (Cardiovascular):  .  rosuvastatin (CRESTOR) 5 MG tablet, TAKE 1 TABLET BY MOUTH EVERYDAY AT BEDTIME .  telmisartan (MICARDIS) 40 MG tablet, TAKE 1 TABLET DAILY FOR BLOOD PRESSURE  Current Outpatient Medications (Respiratory):  .  albuterol (VENTOLIN HFA) 108 (90 Base) MCG/ACT inhaler, INHALE 2 PUFFS INTO THE LUNGS EVERY 4 (FOUR) HOURS AS NEEDED FOR WHEEZING OR SHORTNESS OF BREATH. .  fexofenadine (ALLEGRA) 180 MG tablet, Take 180 mg by mouth daily as needed for allergies or rhinitis. Alternates with zyrtec when Zyrtec seems to not be as effective .  levocetirizine (XYZAL) 5 MG tablet, Take 5 mg by mouth at bedtime. .  montelukast  (SINGULAIR) 10 MG tablet, TAKE 1 TABLET BY MOUTH DAILY FOR ALLERGIES .  fluticasone (FLONASE) 50 MCG/ACT nasal spray, Place 2 sprays into both nostrils daily.  Current Outpatient Medications (Analgesics):  .  aspirin 81 MG tablet, Take 81 mg by mouth daily. .  meloxicam (MOBIC) 15 MG tablet, Take   1/2-1 tablet    Daily    with Food    for Pain & Inflammation & try LIMIT to 5 days /week to Avoid Kidney Damage   Current Outpatient Medications (Other):  .  baclofen (LIORESAL) 10 MG tablet, TAKE 1/2 TO 1 TABLET 1 OR 2 X /DAY AS NEEDED FOR MUSCLE SPASMS .  buPROPion (WELLBUTRIN XL) 300 MG 24 hr tablet, Take    1 tablet    every Morning    for Mood, Focus & Concentration .  Cholecalciferol (VITAMIN D)  125 MCG (5000 UT) CAPS, Take 5,000 Units by mouth daily.  Marland Kitchen  escitalopram (LEXAPRO) 20 MG tablet, Take 1 tablet Daily for Mood .  Eszopiclone 3 MG TABS, Take immediately before bedtime .  fluocinolone (SYNALAR) 0.025 % ointment, APPLY TO AFFECTED AREA TWICE A DAY (Patient taking differently: as needed.) .  fluocinonide (LIDEX) 0.05 % external solution, Apply 1 application topically 2 (two) times daily. (Patient taking differently: Apply 1 application topically as needed.) .  MELATONIN PO, Take 5 mg by mouth at bedtime.  .  Multiple Vitamin (MULTIVITAMIN) tablet, Take 1 tablet by mouth daily. Marland Kitchen  nystatin (MYCOSTATIN/NYSTOP) powder, Apply 1 application topically 2 (two) times daily. Marland Kitchen  omeprazole (PRILOSEC) 40 MG capsule, TAKE 1 CAPSULE BY MOUTH EVERY DAY .  triamcinolone ointment (KENALOG) 0.1 %, Apply 1 application topically 2 (two) times daily. (Patient taking differently: Apply 1 application topically daily as needed (rash).) .  vitamin C (ASCORBIC ACID) 500 MG tablet, Take 500 mg by mouth daily. .  vitamin E 600 UNIT capsule, Take 600 Units by mouth daily.  Health Maintenance:   Immunization History  Administered Date(s) Administered  . Influenza Inj Mdck Quad With Preservative 10/25/2016  .  Influenza, Seasonal, Injecte, Preservative Fre 01/01/2015  . Influenza,inj,Quad PF,6+ Mos 10/25/2012, 01/20/2020  . Influenza-Unspecified 06/05/2018, 12/06/2018  . PFIZER(Purple Top)SARS-COV-2 Vaccination 05/03/2019, 05/29/2019  . PPD Test 04/11/2013  . Pneumococcal Polysaccharide-23 03/28/2012  . Tdap 12/12/2012   Tetanus: 2014 Pneumovax: 2014 Flu vaccine: 2020 at [facility]  Pap: Levar Dec 2019 MGM: 2017- will get  Last Dental Exam: Yearly visit PFTs unable to complete  Patient Care Team: Lucky Cowboy, MD as PCP - General (Internal Medicine) Antionette Char, MD as Consulting Physician (Obstetrics and Gynecology) Cindee Salt, MD as Consulting Physician (Orthopedic Surgery) Lucky Cowboy, MD as Referring Physician (Internal Medicine)  Medical History:  Past Medical History:  Diagnosis Date  . Anxiety   . Arthritis   . Depression    no meds  . Diaphragmatic hernia with obstruction, without gangrene 11/07/2018  . Dyspnea    doe with exertion occ  . Family history of breast cancer   . Family history of colon cancer   . Family history of lung cancer   . Family history of pancreatic cancer   . Family history of prostate cancer   . GERD (gastroesophageal reflux disease)   . Hernia, diaphragmatic   . History of kidney stones   . Hyperlipidemia   . Hypertension    no meds  . Pneumonia    1 time   Allergies Allergies  Allergen Reactions  . Doxycycline Shortness Of Breath and Anxiety  . Erythromycin Nausea Only and Other (See Comments)    Stomach cramping (can take Z Pak)  . Flexeril [Cyclobenzaprine Hcl] Other (See Comments)    "don't like the way I feel"  . Metformin And Related Other (See Comments)    GI Upset    SURGICAL HISTORY She  has a past surgical history that includes Dilation and evacuation; Dilation and curettage of uterus; Video assisted thoracoscopy (vats)/thorocotomy (Right, 03/23/2018); epigastric hernia repair (N/A, 03/23/2018); IR  THORACENTESIS ASP PLEURAL SPACE W/IMG GUIDE (04/19/2018); and Dilatation & curettage/hysteroscopy with myosure (N/A, 10/18/2019).   FAMILY HISTORY Her family history includes Breast cancer in her cousin and maternal aunt; Breast cancer (age of onset: 47) in her mother; Cervical cancer in her paternal aunt; Colon cancer in her maternal aunt, maternal aunt, maternal uncle, and maternal uncle; Colon polyps in her maternal  aunt and mother; Congestive Heart Failure in her mother; Diabetes in her brother, maternal aunt, maternal uncle, and mother; Heart disease in her maternal aunt, maternal grandmother, and maternal uncle; Hemochromatosis in her paternal uncle; Hyperlipidemia in her brother, maternal aunt, maternal uncle, and mother; Hypertension in her brother, maternal aunt, maternal uncle, and mother; Kidney disease in her mother; Lung cancer in her mother; Ovarian cancer in her paternal aunt; Pancreatic cancer in her maternal aunt; Polycystic ovary syndrome in her paternal aunt; Prostate cancer in her cousin, cousin, and maternal uncle; Stroke in her maternal aunt and maternal uncle; Throat cancer in her maternal aunt.   SOCIAL HISTORY She  reports that she has never smoked. She has never used smokeless tobacco. She reports previous alcohol use. She reports that she does not use drugs.  Review of Systems: Review of Systems  Constitutional: Negative for chills, fever and malaise/fatigue.  HENT: Negative for congestion, ear pain and sore throat.   Eyes: Negative.   Respiratory: Negative for cough, shortness of breath and wheezing.   Cardiovascular: Negative for chest pain, palpitations and leg swelling.  Gastrointestinal: Negative for abdominal pain, blood in stool, constipation, diarrhea, heartburn and melena.  Genitourinary: Negative for dysuria, frequency, hematuria and urgency.  Neurological: Negative for dizziness, loss of consciousness and headaches.  Psychiatric/Behavioral: Negative for  depression. The patient is not nervous/anxious and does not have insomnia.     Physical Exam: Estimated body mass index is 42.51 kg/m as calculated from the following:   Height as of this encounter: 5\' 1"  (1.549 m).   Weight as of this encounter: 225 lb (102.1 kg). BP 120/80   Pulse 68   Temp (!) 97.3 F (36.3 C)   Ht 5\' 1"  (1.549 m)   Wt 225 lb (102.1 kg)   SpO2 100%   BMI 42.51 kg/m   General Appearance: Well nourished well developed, in no apparent distress.  Eyes: PERRLA, EOMs, conjunctiva no swelling or erythema ENT/Mouth: Ear canals normal without obstruction, swelling, erythema, or discharge.  TMs normal bilaterally with no erythema, bulging, retraction, or loss of landmark.  Oropharynx moist and clear with no exudate, erythema, or swelling.  Neck: Supple, thyroid normal. No bruits.  No cervical adenopathy Respiratory: Respiratory effort normal, Breath sounds clear A&P without wheeze, rhonchi, rales.   Cardio: RRR without murmurs, rubs or gallops. Brisk peripheral pulses without edema.  Chest: symmetric, with normal excursions Breasts: Symmetric, without lumps, nipple discharge, retractions. Right nipple tender, dry scaly. Abdomen: Soft, nontender, no guarding, rebound, hernias, masses, or organomegaly.  Lymphatics: Non tender without lymphadenopathy.  Musculoskeletal: Full ROM all peripheral extremities,5/5 strength, and normal gait.  Skin: Warm, dry without rashes, lesions, ecchymosis. Neuro: Awake and oriented X 3, Cranial nerves intact, reflexes equal bilaterally. Normal muscle tone, no cerebellar symptoms.  Psych:  normal affect, Insight and Judgment appropriate.   EKG: WNL no changes.   , NP 10:51 AM [facility]

## 2020-03-18 ENCOUNTER — Other Ambulatory Visit: Payer: Self-pay | Admitting: Internal Medicine

## 2020-03-18 DIAGNOSIS — G4700 Insomnia, unspecified: Secondary | ICD-10-CM

## 2020-03-18 LAB — CBC WITH DIFFERENTIAL/PLATELET
Absolute Monocytes: 534 cells/uL (ref 200–950)
Basophils Absolute: 28 cells/uL (ref 0–200)
Basophils Relative: 0.5 %
Eosinophils Absolute: 143 cells/uL (ref 15–500)
Eosinophils Relative: 2.6 %
HCT: 39.2 % (ref 35.0–45.0)
Hemoglobin: 13.3 g/dL (ref 11.7–15.5)
Lymphs Abs: 2096 cells/uL (ref 850–3900)
MCH: 32.4 pg (ref 27.0–33.0)
MCHC: 33.9 g/dL (ref 32.0–36.0)
MCV: 95.6 fL (ref 80.0–100.0)
MPV: 9.5 fL (ref 7.5–12.5)
Monocytes Relative: 9.7 %
Neutro Abs: 2701 cells/uL (ref 1500–7800)
Neutrophils Relative %: 49.1 %
Platelets: 287 10*3/uL (ref 140–400)
RBC: 4.1 10*6/uL (ref 3.80–5.10)
RDW: 12.2 % (ref 11.0–15.0)
Total Lymphocyte: 38.1 %
WBC: 5.5 10*3/uL (ref 3.8–10.8)

## 2020-03-18 LAB — LIPID PANEL
Cholesterol: 148 mg/dL (ref <200)
HDL: 62 mg/dL (ref >=50)
LDL Cholesterol (Calc): 67 mg/dL (calc)
Non-HDL Cholesterol (Calc): 86 mg/dL (calc) (ref <130)
Total CHOL/HDL Ratio: 2.4 (calc) (ref <5.0)
Triglycerides: 109 mg/dL (ref <150)

## 2020-03-18 LAB — URINALYSIS W MICROSCOPIC + REFLEX CULTURE
Bacteria, UA: NONE SEEN /HPF
Bilirubin Urine: NEGATIVE
Glucose, UA: NEGATIVE
Hgb urine dipstick: NEGATIVE
Hyaline Cast: NONE SEEN /LPF
Ketones, ur: NEGATIVE
Leukocyte Esterase: NEGATIVE
Nitrites, Initial: NEGATIVE
Protein, ur: NEGATIVE
RBC / HPF: NONE SEEN /HPF (ref 0–2)
Specific Gravity, Urine: 1 — ABNORMAL LOW (ref 1.001–1.03)
Squamous Epithelial / HPF: NONE SEEN /HPF (ref <=5)
WBC, UA: NONE SEEN /HPF (ref 0–5)
pH: 6 (ref 5.0–8.0)

## 2020-03-18 LAB — VITAMIN D 25 HYDROXY (VIT D DEFICIENCY, FRACTURES): Vit D, 25-Hydroxy: 43 ng/mL (ref 30–100)

## 2020-03-18 LAB — IRON, TOTAL/TOTAL IRON BINDING CAP
%SAT: 29 % (calc) (ref 16–45)
Iron: 116 ug/dL (ref 45–160)
TIBC: 400 mcg/dL (calc) (ref 250–450)

## 2020-03-18 LAB — COMPLETE METABOLIC PANEL WITH GFR
AG Ratio: 1.9 (calc) (ref 1.0–2.5)
ALT: 19 U/L (ref 6–29)
AST: 16 U/L (ref 10–35)
Albumin: 4.4 g/dL (ref 3.6–5.1)
Alkaline phosphatase (APISO): 66 U/L (ref 37–153)
BUN: 12 mg/dL (ref 7–25)
CO2: 25 mmol/L (ref 20–32)
Calcium: 9.2 mg/dL (ref 8.6–10.4)
Chloride: 105 mmol/L (ref 98–110)
Creat: 0.93 mg/dL (ref 0.50–1.05)
GFR, Est African American: 83 mL/min/{1.73_m2} (ref >=60)
GFR, Est Non African American: 72 mL/min/{1.73_m2} (ref >=60)
Globulin: 2.3 g/dL (calc) (ref 1.9–3.7)
Glucose, Bld: 84 mg/dL (ref 65–99)
Potassium: 4.3 mmol/L (ref 3.5–5.3)
Sodium: 140 mmol/L (ref 135–146)
Total Bilirubin: 0.4 mg/dL (ref 0.2–1.2)
Total Protein: 6.7 g/dL (ref 6.1–8.1)

## 2020-03-18 LAB — TSH: TSH: 2.42 mIU/L

## 2020-03-18 LAB — HEMOGLOBIN A1C
Hgb A1c MFr Bld: 5.5 % of total Hgb (ref <5.7)
Mean Plasma Glucose: 111 mg/dL
eAG (mmol/L): 6.2 mmol/L

## 2020-03-18 LAB — VITAMIN B12: Vitamin B-12: 586 pg/mL (ref 200–1100)

## 2020-03-18 LAB — NO CULTURE INDICATED

## 2020-03-18 MED ORDER — ESZOPICLONE 3 MG PO TABS: ORAL_TABLET | ORAL | 1 refills | Status: DC

## 2020-03-18 MED ORDER — MONTELUKAST SODIUM 10 MG PO TABS: ORAL_TABLET | ORAL | 0 refills | Status: DC

## 2020-03-23 DIAGNOSIS — G4733 Obstructive sleep apnea (adult) (pediatric): Secondary | ICD-10-CM | POA: Diagnosis not present

## 2020-04-01 ENCOUNTER — Encounter: Payer: Self-pay | Admitting: Adult Health

## 2020-04-01 NOTE — Telephone Encounter (Signed)
As long as I have seen her within a year, she should be able to contact her DME company to request mask refitting.  Please let me know if order is indicated

## 2020-04-08 ENCOUNTER — Other Ambulatory Visit: Payer: Self-pay | Admitting: Adult Health Nurse Practitioner

## 2020-04-08 DIAGNOSIS — F330 Major depressive disorder, recurrent, mild: Secondary | ICD-10-CM

## 2020-04-20 DIAGNOSIS — G4733 Obstructive sleep apnea (adult) (pediatric): Secondary | ICD-10-CM | POA: Diagnosis not present

## 2020-04-27 ENCOUNTER — Ambulatory Visit: Payer: BC Managed Care – PPO | Admitting: Adult Health Nurse Practitioner

## 2020-05-04 ENCOUNTER — Other Ambulatory Visit: Payer: Self-pay

## 2020-05-04 ENCOUNTER — Encounter: Payer: Self-pay | Admitting: Adult Health Nurse Practitioner

## 2020-05-04 ENCOUNTER — Ambulatory Visit: Payer: BC Managed Care – PPO | Admitting: Adult Health Nurse Practitioner

## 2020-05-04 VITALS — BP 134/84 | HR 90 | Temp 97.5°F | Ht 61.0 in | Wt 222.2 lb

## 2020-05-04 DIAGNOSIS — F330 Major depressive disorder, recurrent, mild: Secondary | ICD-10-CM | POA: Diagnosis not present

## 2020-05-04 DIAGNOSIS — R079 Chest pain, unspecified: Secondary | ICD-10-CM

## 2020-05-04 DIAGNOSIS — Z79899 Other long term (current) drug therapy: Secondary | ICD-10-CM

## 2020-05-04 DIAGNOSIS — K219 Gastro-esophageal reflux disease without esophagitis: Secondary | ICD-10-CM | POA: Diagnosis not present

## 2020-05-04 DIAGNOSIS — I10 Essential (primary) hypertension: Secondary | ICD-10-CM

## 2020-05-04 MED ORDER — DULOXETINE HCL 60 MG PO CPEP: ORAL_CAPSULE | ORAL | 1 refills | Status: DC

## 2020-05-04 MED ORDER — OMEPRAZOLE 40 MG PO CPDR: DELAYED_RELEASE_CAPSULE | ORAL | 3 refills | Status: DC

## 2020-05-04 NOTE — Progress Notes (Signed)
Assessment and Plan:  Samantha Greene was seen today for follow-up.  Diagnoses and all orders for this visit:  Mild episode of recurrent major depressive disorder (HCC) Improved since last OV Continue Decrease Wellbutrin from 300mg  to 150mg  to avoid serotonin syndrome has had in the past. -     DULoxetine (CYMBALTA) 60 MG capsule; TAKE 1 CAPSULE BY MOUTH EVERY DAY Discussed stress management techniques  Discussed, increase water,intake & good sleep hygiene  Discussed increasing exercise & vegetables in diet  Essential hypertension Continue current medications Monitor blood pressure at home; call if consistently over 130/80 Continue DASH diet.   Reminder to go to the ER if any CP, SOB, nausea, dizziness, severe HA, changes vision/speech, left arm numbness and tingling and jaw pain.   Gastroesophageal reflux disease without esophagitis Doing well at this time Diet discussed Monitor for triggers Avoid food with high acid content Avoid excessive cafeine Increase water intake -     omeprazole (PRILOSEC) 40 MG capsule; TAKE 1 CAPSULE BY MOUTH EVERY DAY  Medication management Continued   Further disposition pending results if labs check today. Discussed med's effects and SE's.   Over 30 minutes of face to face interview, exam, counseling, chart review, and critical decision making was performed.   Discussed med's effects and SE's. Screening labs and tests as requested with regular follow-up as recommended. Future Appointments  Date Time Provider Department Center  01/20/2021  2:30 PM , MD [facility] None  03/17/2021 10:00 AM Genia Del, NP [facility] None    HPI  50 y.o. female  presents for follow up on mood.  She was started on celexa 30mg  daily.  She increased this to 60mg  and reports that she is doing well.  She has noticed an improvement in her overall mood. Discussed decreasing wellbutrin from 300mg  to 150mg  daily.   She has OCA on CPAP.  She has  reached out to [facility] regarding the fit of her CPAP mask.     BMI is Body mass index is 41.98 kg/m., she is working on diet and exercise. Wt Readings from Last 3 Encounters:  05/04/20 222 lb 3.2 oz (100.8 kg)  03/17/20 225 lb (102.1 kg)  01/20/20 221 lb (100.2 kg)   Her blood pressure has been controlled at home, today their BP is BP: 134/84.  She does workout. She denies chest pain, shortness of breath, dizziness.   She is on cholesterol medication and denies myalgias. Her cholesterol is at goal. The cholesterol last visit was:  Lab Results  Component Value Date   CHOL 148 03/17/2020   HDL 62 03/17/2020   LDLCALC 67 03/17/2020   TRIG 109 03/17/2020   CHOLHDL 2.4 03/17/2020  . She has been working on diet and exercise for prediabetes, she is on bASA, she is on ACE/ARB and denies foot ulcerations, hyperglycemia, hypoglycemia , increased appetite, nausea, paresthesia of the feet, polydipsia, polyuria, visual disturbances, vomiting and weight loss. Last A1C in the office was:  Lab Results  Component Value Date   HGBA1C 5.5 03/17/2020   Patient is on Vitamin D supplement, she is on 5000 daily.   Lab Results  Component Value Date   VD25OH 26 03/17/2020     She has recurrent sinus injections, going to call Dr. 03/19/2020 to try get nasal surgery- has been 3 years.   Current Medications:   Current Outpatient Medications (Endocrine & Metabolic):  .  norethindrone (MICRONOR) 0.35 MG tablet, Take 1 tablet (0.35 mg total) by mouth daily.  Current Outpatient Medications (Cardiovascular):  .  rosuvastatin (CRESTOR) 5 MG tablet, TAKE 1 TABLET BY MOUTH EVERYDAY AT BEDTIME .  telmisartan (MICARDIS) 40 MG tablet, TAKE 1 TABLET BY MOUTH EVERY DAY FOR BLOOD PRESSURE  Current Outpatient Medications (Respiratory):  .  albuterol (VENTOLIN HFA) 108 (90 Base) MCG/ACT inhaler, INHALE 2 PUFFS INTO THE LUNGS EVERY 4 (FOUR) HOURS AS NEEDED FOR WHEEZING OR SHORTNESS OF BREATH. .  fexofenadine (ALLEGRA) 180  MG tablet, Take 180 mg by mouth daily as needed for allergies or rhinitis. Alternates with zyrtec when Zyrtec seems to not be as effective .  levocetirizine (XYZAL) 5 MG tablet, Take 5 mg by mouth at bedtime. .  montelukast (SINGULAIR) 10 MG tablet, TAKE 1 TABLET BY MOUTH DAILY FOR ALLERGIES .  fluticasone (FLONASE) 50 MCG/ACT nasal spray, Place 2 sprays into both nostrils daily.  Current Outpatient Medications (Analgesics):  .  aspirin 81 MG tablet, Take 81 mg by mouth daily. .  meloxicam (MOBIC) 15 MG tablet, Take   1/2-1 tablet    Daily    with Food    for Pain & Inflammation & try LIMIT to 5 days /week to Avoid Kidney Damage   Current Outpatient Medications (Other):  .  baclofen (LIORESAL) 10 MG tablet, TAKE 1/2 TO 1 TABLET 1 OR 2 X /DAY AS NEEDED FOR MUSCLE SPASMS .  buPROPion (WELLBUTRIN XL) 300 MG 24 hr tablet, Take    1 tablet    every Morning    for Mood, Focus & Concentration .  Cholecalciferol (VITAMIN D) 125 MCG (5000 UT) CAPS, Take 5,000 Units by mouth daily.  .  DULoxetine (CYMBALTA) 30 MG capsule, TAKE 1 CAPSULE BY MOUTH EVERY DAY .  Eszopiclone 3 MG TABS, Take immediately before bedtime .  fluocinolone (SYNALAR) 0.025 % ointment, APPLY TO AFFECTED AREA TWICE A DAY (Patient taking differently: as needed.) .  fluocinonide (LIDEX) 0.05 % external solution, Apply 1 application topically 2 (two) times daily. (Patient taking differently: Apply 1 application topically as needed.) .  MELATONIN PO, Take 5 mg by mouth at bedtime.  .  Multiple Vitamin (MULTIVITAMIN) tablet, Take 1 tablet by mouth daily. Marland Kitchen.  nystatin (MYCOSTATIN/NYSTOP) powder, Apply 1 application topically 2 (two) times daily. Marland Kitchen.  omeprazole (PRILOSEC) 40 MG capsule, TAKE 1 CAPSULE BY MOUTH EVERY DAY .  triamcinolone ointment (KENALOG) 0.1 %, Apply 1 application topically 2 (two) times daily. (Patient taking differently: Apply 1 application topically daily as needed (rash).) .  vitamin C (ASCORBIC ACID) 500 MG tablet, Take  500 mg by mouth daily. .  vitamin E 600 UNIT capsule, Take 600 Units by mouth daily.  Health Maintenance:   Immunization History  Administered Date(s) Administered  . Influenza Inj Mdck Quad With Preservative 10/25/2016  . Influenza, Seasonal, Injecte, Preservative Fre 01/01/2015  . Influenza,inj,Quad PF,6+ Mos 10/25/2012, 01/20/2020  . Influenza-Unspecified 06/05/2018, 12/06/2018  . PFIZER(Purple Top)SARS-COV-2 Vaccination 05/03/2019, 05/29/2019  . PPD Test 04/11/2013  . Pneumococcal Polysaccharide-23 03/28/2012  . Tdap 12/12/2012   Tetanus: 2014 Pneumovax: 2014 Flu vaccine: 2020 at [facility]  Pap: Lavoie, Dec 2019 MGM: 06/2019 Last Dental Exam: Yearly visit Colonoscopy: Referral placed today   Patient Care Team: Lucky CowboyMcKeown, William, MD as PCP - General (Internal Medicine) Antionette CharJackson-Moore, Lisa, MD as Consulting Physician (Obstetrics and Gynecology) Cindee SaltKuzma, Gary, MD as Consulting Physician (Orthopedic Surgery) Lucky CowboyMcKeown, William, MD as Referring Physician (Internal Medicine)  Medical History:  Past Medical History:  Diagnosis Date  . Anxiety   . Arthritis   . Depression  no meds  . Diaphragmatic hernia with obstruction, without gangrene 11/07/2018  . Dyspnea    doe with exertion occ  . Family history of breast cancer   . Family history of colon cancer   . Family history of lung cancer   . Family history of pancreatic cancer   . Family history of prostate cancer   . GERD (gastroesophageal reflux disease)   . Hernia, diaphragmatic   . History of kidney stones   . Hyperlipidemia   . Hypertension    no meds  . Pneumonia    1 time   Allergies Allergies  Allergen Reactions  . Doxycycline Shortness Of Breath and Anxiety  . Erythromycin Nausea Only and Other (See Comments)    Stomach cramping (can take Z Pak)  . Flexeril [Cyclobenzaprine Hcl] Other (See Comments)    "don't like the way I feel"  . Metformin And Related Other (See Comments)    GI Upset    SURGICAL  HISTORY She  has a past surgical history that includes Dilation and evacuation; Dilation and curettage of uterus; Video assisted thoracoscopy (vats)/thorocotomy (Right, 03/23/2018); epigastric hernia repair (N/A, 03/23/2018); IR THORACENTESIS ASP PLEURAL SPACE W/IMG GUIDE (04/19/2018); and Dilatation & curettage/hysteroscopy with myosure (N/A, 10/18/2019).   FAMILY HISTORY Her family history includes Breast cancer in her cousin and maternal aunt; Breast cancer (age of onset: 8) in her mother; Cervical cancer in her paternal aunt; Colon cancer in her maternal aunt, maternal aunt, maternal uncle, and maternal uncle; Colon polyps in her maternal aunt and mother; Congestive Heart Failure in her mother; Diabetes in her brother, maternal aunt, maternal uncle, and mother; Heart disease in her maternal aunt, maternal grandmother, and maternal uncle; Hemochromatosis in her paternal uncle; Hyperlipidemia in her brother, maternal aunt, maternal uncle, and mother; Hypertension in her brother, maternal aunt, maternal uncle, and mother; Kidney disease in her mother; Lung cancer in her mother; Ovarian cancer in her paternal aunt; Pancreatic cancer in her maternal aunt; Polycystic ovary syndrome in her paternal aunt; Prostate cancer in her cousin, cousin, and maternal uncle; Stroke in her maternal aunt and maternal uncle; Throat cancer in her maternal aunt.   SOCIAL HISTORY She  reports that she has never smoked. She has never used smokeless tobacco. She reports previous alcohol use. She reports that she does not use drugs.  Review of Systems: Review of Systems  Constitutional: Negative for chills, fever and malaise/fatigue.  HENT: Negative for congestion, ear pain and sore throat.   Eyes: Negative.   Respiratory: Negative for cough, shortness of breath and wheezing.   Cardiovascular: Negative for chest pain, palpitations and leg swelling.  Gastrointestinal: Positive for heartburn. Negative for abdominal pain, blood in  stool, constipation, diarrhea, melena, nausea and vomiting.  Genitourinary: Negative for dysuria, frequency, hematuria and urgency.  Musculoskeletal: Negative for back pain, joint pain, myalgias and neck pain.  Neurological: Negative for dizziness, loss of consciousness and headaches.  Psychiatric/Behavioral: Negative for depression. The patient is not nervous/anxious and does not have insomnia.     Physical Exam: Estimated body mass index is 41.98 kg/m as calculated from the following:   Height as of this encounter: 5\' 1"  (1.549 m).   Weight as of this encounter: 222 lb 3.2 oz (100.8 kg). BP 134/84   Pulse 90   Temp (!) 97.5 F (36.4 C)   Ht 5\' 1"  (1.549 m)   Wt 222 lb 3.2 oz (100.8 kg)   SpO2 99%   BMI 41.98 kg/m   General  Appearance: Well nourished well developed, in no apparent distress.  Eyes: PERRLA, EOMs, conjunctiva no swelling or erythema ENT/Mouth: Ear canals normal without obstruction, swelling, erythema, or discharge.  TMs normal bilaterally with no erythema, bulging, retraction, or loss of landmark.  Oropharynx moist and clear with no exudate, erythema, or swelling.  Neck: Supple, thyroid normal. No bruits.  No cervical adenopathy Respiratory: Respiratory effort normal, Breath sounds clear A&P without wheeze, rhonchi, rales.   Cardio: RRR without murmurs, rubs or gallops. Brisk peripheral pulses without edema.  Abdomen: Soft, nontender, no guarding, rebound, hernias, masses, or organomegaly.  Lymphatics: Non tender without lymphadenopathy.  Musculoskeletal: Full ROM all peripheral extremities,5/5 strength, and normal gait.  Skin: Warm, dry without rashes, lesions, ecchymosis. Neuro: Awake and oriented X 3, Cranial nerves intact, reflexes equal bilaterally. Normal muscle tone, no cerebellar symptoms.  Psych:  normal affect, Insight and Judgment appropriate.     Elder Negus, Edrick Oh, DNP [facility] 05/04/2020  4:31  PM

## 2020-05-07 ENCOUNTER — Ambulatory Visit: Payer: BC Managed Care – PPO | Admitting: Adult Health

## 2020-05-07 ENCOUNTER — Other Ambulatory Visit: Payer: Self-pay

## 2020-05-07 ENCOUNTER — Encounter: Payer: Self-pay | Admitting: Adult Health

## 2020-05-07 VITALS — BP 122/87 | HR 81 | Ht 61.0 in | Wt 222.0 lb

## 2020-05-07 DIAGNOSIS — G4733 Obstructive sleep apnea (adult) (pediatric): Secondary | ICD-10-CM | POA: Diagnosis not present

## 2020-05-07 DIAGNOSIS — Z9989 Dependence on other enabling machines and devices: Secondary | ICD-10-CM

## 2020-05-07 DIAGNOSIS — G4719 Other hypersomnia: Secondary | ICD-10-CM | POA: Diagnosis not present

## 2020-05-07 MED ORDER — ARMODAFINIL 250 MG PO TABS: 250.0000 mg | ORAL_TABLET | Freq: Every day | ORAL | 4 refills | Status: DC
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
## 2020-05-07 NOTE — Patient Instructions (Addendum)
Start armodafinil daily to help with continue day time fatigue  For the first day, only take half tab and then you can take full tablet  Please call with any difficulty tolerating such as worsening your anxiety    Follow up in 3 months or call earlier if needed    Armodafinil tablets What is this medicine? ARMODAFINIL (ar moe DAF i nil) is used to treat excessive sleepiness caused by certain sleep disorders. This includes narcolepsy, sleep apnea, and shift work sleep disorder. This medicine may be used for other purposes; ask your health care provider or pharmacist if you have questions. COMMON BRAND NAME(S): Nuvigil What should I tell my health care provider before I take this medicine? They need to know if you have any of these conditions:  bipolar disorder  depression  drug or alcohol abuse or addiction  heart disease  high blood pressure  kidney disease  liver disease  schizophrenia  suicidal thoughts, plans, or attempt; a previous suicide attempt by you or a family member  an unusual or allergic reaction to armodafinil, modafinil, medicines, foods, dyes, or preservatives  pregnant or trying to get pregnant  breast-feeding How should I use this medicine? Take this medicine by mouth with a glass of water. Follow the directions on the prescription label. Take your doses at regular intervals. Do not take your medicine more often than directed. Do not stop taking this medicine suddenly except upon the advice of your doctor. Stopping this medicine too quickly may cause serious side effects or your condition may worsen. A special MedGuide will be given to you by the pharmacist with each prescription and refill. Be sure to read this information carefully each time. Talk to your pediatrician regarding the use of this medicine in children. While this drug may be prescribed for children as young as 14 years of age for selected conditions, precautions do apply. Overdosage: If  you think you have taken too much of this medicine contact a poison control center or emergency room at once. NOTE: This medicine is only for you. Do not share this medicine with others. What if I miss a dose? If you miss a dose, take it as soon as you can. If it is almost time for your next dose, take only that dose. Do not take double or extra doses. What may interact with this medicine? Do not take this medicine with any of the following medications:  amphetamine or dextroamphetamine  dexmethylphenidate or methylphenidate  MAOIs like Carbex, Eldepryl, Marplan, Nardil, and Parnate  pemoline  procarbazine This medicine may also interact with the following medications:  antifungal medicines like itraconazole or ketoconazole  barbiturates, like phenobarbital  birth control pills or other hormone-containing birth control devices or implants  carbamazepine  cyclosporine  diazepam  medicines for depression, anxiety, or psychotic disturbances  phenytoin  propranolol  triazolam  warfarin This list may not describe all possible interactions. Give your health care provider a list of all the medicines, herbs, non-prescription drugs, or dietary supplements you use. Also tell them if you smoke, drink alcohol, or use illegal drugs. Some items may interact with your medicine. What should I watch for while using this medicine? Visit your doctor or healthcare provider for regular checks on your progress. The full effect of this medicine may not be seen right away. This medicine may affect your concentration, function, or may hide signs that you are tired. You may get dizzy. This medicine will not eliminate your abnormal tendency to fall  asleep and is not a replacement for sleep. Do not change your previous behavior regarding potentially dangerous activities, such as driving, using machinery, or doing anything that needs mental alertness until you know how this drug affects you. Alcohol can  make you more dizzy and may interfere with your response to this medicine or your alertness. Avoid alcoholic drinks. This medicine may cause serious skin reactions. They can happen weeks to months after starting the medicine. Contact your healthcare provider right away if you notice fevers or flu-like symptoms with a rash. The rash may be red or purple and then turn into blisters or peeling of the skin. Or, you might notice a red rash with swelling of the face, lips, or lymph nodes in your neck or under your arms. Birth control pills may not work properly while you are taking this medicine. While using birth control pills, you will need an additional barrier method or an alternative non-hormonal method of birth control during treatment with armodafinil and for 1 month after stopping armodafinil. Talk to your doctor about which extra method of birth control is right for you. It is unknown if the effects of this medicine will be increased by the use of caffeine. Caffeine is found in many foods, beverages, and medications. Ask your doctor if you should limit or change your intake of caffeine-containing products while on this medicine. Do not stop previously prescribed treatments for your condition, such as a CPAP machine, except on the advise of your physician or healthcare provider. What side effects may I notice from receiving this medicine? Side effects that you should report to your doctor or health care professional as soon as possible:  allergic reactions like skin rash, itching or hives, swelling of the face, lips, or tongue  anxiety  breathing or swallowing problems  chest pain  depressed mood  elevated mood, decreased need for sleep, racing thoughts, impulsive behavior  fast, irregular heartbeat  hallucination, loss of contact with reality  increased blood pressure  mouth sores, blisters, or peeling skin  rash, fever, and swollen lymph nodes  redness, blistering, peeling, or  loosening of the skin, including inside the mouth  sore throat, fever, or chills  suicidal thoughts or other mood changes  tremors  vomiting Side effects that usually do not require medical attention (report to your doctor or health care professional if they continue or are bothersome):  headache  nausea, diarrhea, or stomach upset  nervousness  trouble sleeping This list may not describe all possible side effects. Call your doctor for medical advice about side effects. You may report side effects to FDA at 1-800-FDA-1088. Where should I keep my medicine? Keep out of the reach of children. Store at room temperature between 20 and 25 degrees C (68 and 77 degrees F). Throw away any unused medicine after the expiration date. NOTE: This sheet is a summary. It may not cover all possible information. If you have questions about this medicine, talk to your doctor, pharmacist, or health care provider.  2021 [employer] Standard (2018-04-10 10:01:22)

## 2020-05-10 ENCOUNTER — Encounter: Payer: Self-pay | Admitting: Adult Health

## 2020-05-11 ENCOUNTER — Other Ambulatory Visit: Payer: Self-pay | Admitting: Adult Health

## 2020-05-11 DIAGNOSIS — M5441 Lumbago with sciatica, right side: Secondary | ICD-10-CM

## 2020-05-12 ENCOUNTER — Other Ambulatory Visit: Payer: Self-pay | Admitting: Internal Medicine

## 2020-05-12 DIAGNOSIS — F3341 Major depressive disorder, recurrent, in partial remission: Secondary | ICD-10-CM

## 2020-05-13 ENCOUNTER — Telehealth: Payer: Self-pay | Admitting: *Deleted

## 2020-05-13 NOTE — Telephone Encounter (Signed)
Initiated on CMM, could not pull up pt with information that I had. Called [employer] anthem spoke to [place], then [place] in [place] dpet for Armodafinil 250mg  po daily.  G47.33 G47.19 OSA CPAP, Excessive daytime sleepiness. Approved 05-13-2020 thru 05-13-2021.

## 2020-05-19 ENCOUNTER — Encounter: Payer: Self-pay | Admitting: Adult Health

## 2020-05-21 DIAGNOSIS — G4733 Obstructive sleep apnea (adult) (pediatric): Secondary | ICD-10-CM | POA: Diagnosis not present

## 2020-05-26 ENCOUNTER — Encounter: Payer: Self-pay | Admitting: Adult Health

## 2020-05-27 NOTE — Telephone Encounter (Signed)
Cherylin Mylar, RN Let me look into this - I don't see where Shanda [facility] had anything pending for this patient as she received a PAP in 2020.   I will let you know what I find ;)   Christina .

## 2020-06-05 ENCOUNTER — Encounter: Payer: Self-pay | Admitting: Adult Health

## 2020-06-09 ENCOUNTER — Telehealth: Payer: Self-pay | Admitting: *Deleted

## 2020-06-09 DIAGNOSIS — R0600 Dyspnea, unspecified: Secondary | ICD-10-CM

## 2020-06-09 DIAGNOSIS — R0609 Other forms of dyspnea: Secondary | ICD-10-CM

## 2020-06-09 MED ORDER — ALBUTEROL SULFATE HFA 108 (90 BASE) MCG/ACT IN AERS: 2.0000 | INHALATION_SPRAY | RESPIRATORY_TRACT | 0 refills | Status: DC | PRN

## 2020-06-09 NOTE — Telephone Encounter (Signed)
Returned call to patient regarding refill for an inhaler. Patient is out of town camping and is having trouble. She is taking Allegra and Singulair, but requested an inhaler.Albuterol inhaler sent to [facility] in [place], [place].

## 2020-06-20 DIAGNOSIS — G4733 Obstructive sleep apnea (adult) (pediatric): Secondary | ICD-10-CM | POA: Diagnosis not present

## 2020-06-22 DIAGNOSIS — G4733 Obstructive sleep apnea (adult) (pediatric): Secondary | ICD-10-CM | POA: Diagnosis not present

## 2020-07-21 DIAGNOSIS — G4733 Obstructive sleep apnea (adult) (pediatric): Secondary | ICD-10-CM | POA: Diagnosis not present

## 2020-08-11 ENCOUNTER — Ambulatory Visit: Payer: BC Managed Care – PPO | Admitting: Adult Health

## 2020-08-11 ENCOUNTER — Encounter: Payer: Self-pay | Admitting: Adult Health

## 2020-08-11 VITALS — BP 116/64 | HR 74 | Ht 61.0 in | Wt 225.3 lb

## 2020-08-11 DIAGNOSIS — G4719 Other hypersomnia: Secondary | ICD-10-CM | POA: Diagnosis not present

## 2020-08-11 DIAGNOSIS — Z9989 Dependence on other enabling machines and devices: Secondary | ICD-10-CM | POA: Diagnosis not present

## 2020-08-11 DIAGNOSIS — G4733 Obstructive sleep apnea (adult) (pediatric): Secondary | ICD-10-CM | POA: Diagnosis not present

## 2020-08-11 NOTE — Patient Instructions (Signed)
Follow up in 6 months or call earlier if needed    Thank you for coming to see [place] at [facility]. I hope we have been able to provide you high quality care today.  You may receive a patient satisfaction survey over the next few weeks. We would appreciate your feedback and comments so that we may continue to improve ourselves and the health of our patients.
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
## 2020-08-12 DIAGNOSIS — G4733 Obstructive sleep apnea (adult) (pediatric): Secondary | ICD-10-CM | POA: Diagnosis not present

## 2020-08-16 ENCOUNTER — Other Ambulatory Visit: Payer: Self-pay | Admitting: Adult Health Nurse Practitioner

## 2020-08-20 DIAGNOSIS — G4733 Obstructive sleep apnea (adult) (pediatric): Secondary | ICD-10-CM | POA: Diagnosis not present

## 2020-09-17 ENCOUNTER — Other Ambulatory Visit: Payer: Self-pay | Admitting: Adult Health Nurse Practitioner

## 2020-09-20 DIAGNOSIS — G4733 Obstructive sleep apnea (adult) (pediatric): Secondary | ICD-10-CM | POA: Diagnosis not present

## 2020-09-21 ENCOUNTER — Other Ambulatory Visit: Payer: Self-pay | Admitting: Adult Health Nurse Practitioner

## 2020-09-21 DIAGNOSIS — G4700 Insomnia, unspecified: Secondary | ICD-10-CM

## 2020-09-30 ENCOUNTER — Other Ambulatory Visit: Payer: Self-pay | Admitting: Adult Health

## 2020-09-30 DIAGNOSIS — M5441 Lumbago with sciatica, right side: Secondary | ICD-10-CM

## 2020-10-09 ENCOUNTER — Other Ambulatory Visit: Payer: Self-pay | Admitting: Nurse Practitioner

## 2020-10-09 DIAGNOSIS — Z79891 Long term (current) use of opiate analgesic: Secondary | ICD-10-CM | POA: Diagnosis not present

## 2020-10-09 DIAGNOSIS — M542 Cervicalgia: Secondary | ICD-10-CM

## 2020-10-09 DIAGNOSIS — F419 Anxiety disorder, unspecified: Secondary | ICD-10-CM | POA: Diagnosis not present

## 2020-10-09 DIAGNOSIS — F331 Major depressive disorder, recurrent, moderate: Secondary | ICD-10-CM | POA: Diagnosis not present

## 2020-10-09 DIAGNOSIS — F4310 Post-traumatic stress disorder, unspecified: Secondary | ICD-10-CM | POA: Diagnosis not present

## 2020-10-09 MED ORDER — GABAPENTIN 300 MG PO CAPS: 300.0000 mg | ORAL_CAPSULE | Freq: Three times a day (TID) | ORAL | 2 refills | Status: DC

## 2020-10-21 DIAGNOSIS — G4733 Obstructive sleep apnea (adult) (pediatric): Secondary | ICD-10-CM | POA: Diagnosis not present

## 2020-10-23 ENCOUNTER — Other Ambulatory Visit: Payer: Self-pay | Admitting: Nurse Practitioner

## 2020-10-23 DIAGNOSIS — J069 Acute upper respiratory infection, unspecified: Secondary | ICD-10-CM

## 2020-10-23 MED ORDER — AZITHROMYCIN 250 MG PO TABS: ORAL_TABLET | ORAL | 1 refills | Status: DC

## 2020-11-03 DIAGNOSIS — F331 Major depressive disorder, recurrent, moderate: Secondary | ICD-10-CM | POA: Diagnosis not present

## 2020-11-12 NOTE — Progress Notes (Signed)
Assessment and Plan:   Samantha Greene was seen today for follow-up.  Diagnoses and all orders for this visit:  Mild episode of recurrent major depressive disorder (HCC)  Continue to follow with [name] PA-C [facility]  Continue current medications  Monitor symptoms  If any thoughts of hurting self or others she is to go to the ER immediately.   Essential hypertension -     CBC with Differential/Platelet -  continue medications, DASH diet, exercise and monitor at home. Call if greater than 130/80.   - Go to the ER if any chest pain, shortness of breath, nausea, dizziness, severe HA, changes vision/speech   Mixed hyperlipidemia -     COMPLETE METABOLIC PANEL WITH GFR -     Lipid panel - Continue medication, diet and exercise  Gastroesophageal reflux disease without esophagitis -     Magnesium - Continue Prilosec and behavior modifications  Morbid obesity (HCC) -     tirzepatide (MOUNJARO) 2.5 MG/0.5ML Pen; Inject 2.5 mg into the skin once a week. - Increase exercise, start focusing on limiting processed carbs and saturated fats  Vitamin D deficiency -     VITAMIN D 25 Hydroxy (Vit-D Deficiency, Fractures)  Medication management  Continued  Abnormal glucose -     tirzepatide (MOUNJARO) 2.5 MG/0.5ML Pen; Inject 2.5 mg into the skin once a week. - Continue to work on diet and exercise  Flu Vaccine Need   Flu vacc quad 6+ mos PF IM given   Further disposition pending results if labs check today. Discussed med's effects and SE's.   Over 30 minutes of face to face interview, exam, counseling, chart review, and critical decision making was performed.   Discussed med's effects and SE's. Screening labs and tests as requested with regular follow-up as recommended. Future Appointments  Date Time Provider Department Center  01/20/2021  2:30 PM Samantha Del, MD [facility] None  02/22/2021 12:45 PM Samantha Austin, NP [facility] None  03/17/2021 10:00 AM  Samantha Humphrey, NP [facility] None    HPI  50 y.o. female  presents for 6 month follow up of HTN, Hyperlipidemia, Obesity, Depression, Abnormal glucose, GERD   She has OSA on CPAP.  She has reached out to [facility] regarding the fit of her CPAP mask.    She is currently on Wellbutrin 300mg  QD and Cymbalta 60mg  QD. She is going to be adding Rexulti  BMI is Body mass index is 44.06 kg/m., she is working on diet and exercise. She is not currently exercising and has not been focusing on her diet.  Wt Readings from Last 3 Encounters:  11/16/20 233 lb 3.2 oz (105.8 kg)  08/11/20 225 lb 5 oz (102.2 kg)  05/07/20 222 lb (100.7 kg)   Her blood pressure has been controlled at home, today their BP is BP: 112/78.   BP Readings from Last 3 Encounters:  11/16/20 112/78  08/11/20 116/64  05/07/20 122/87    She does workout. She denies chest pain, shortness of breath, dizziness.   She is on cholesterol medication and denies myalgias. Her cholesterol is at goal. The cholesterol last visit was:  Lab Results  Component Value Date   CHOL 148 03/17/2020   HDL 62 03/17/2020   LDLCALC 67 03/17/2020   TRIG 109 03/17/2020   CHOLHDL 2.4 03/17/2020  . She has been working on diet and exercise for prediabetes, she is on bASA, she is on ACE/ARB and denies foot ulcerations, hyperglycemia, hypoglycemia , increased  appetite, nausea, paresthesia of the feet, polydipsia, polyuria, visual disturbances, vomiting and weight loss. Last A1C in the office was:  Lab Results  Component Value Date   HGBA1C 5.5 03/17/2020   Patient is on Vitamin D supplement, she is on 5000 daily.   Lab Results  Component Value Date   VD25OH 69 03/17/2020     She has been recently diagnosed with bipolar, she does shop when in manic episode. She was still having spells of being down, depressed. She is shopping daily- feels it is almost a compulsion. [name] PA-C with [facility] is starting pt on Rexulti to add to medications.   Pt  has also been having trouble losing weight. She is not exercising or watching diet currently.  Depression is not currently controlled.   Current Medications:   Current Outpatient Medications (Endocrine & Metabolic):    norethindrone (MICRONOR) 0.35 MG tablet, Take 1 tablet (0.35 mg total) by mouth daily. (Patient taking differently: Take 1 tablet by mouth daily. prn)  Current Outpatient Medications (Cardiovascular):    rosuvastatin (CRESTOR) 5 MG tablet, Take  1 tablet  Daily for Cholesterol   telmisartan (MICARDIS) 40 MG tablet, TAKE 1 TABLET BY MOUTH EVERY DAY FOR BLOOD PRESSURE  Current Outpatient Medications (Respiratory):    albuterol (VENTOLIN HFA) 108 (90 Base) MCG/ACT inhaler, Inhale 2 puffs into the lungs every 4 (four) hours as needed for wheezing or shortness of breath.   fexofenadine (ALLEGRA) 180 MG tablet, Take 180 mg by mouth daily as needed for allergies or rhinitis. Alternates with zyrtec when Zyrtec seems to not be as effective   levocetirizine (XYZAL) 5 MG tablet, Take 5 mg by mouth at bedtime.   montelukast (SINGULAIR) 10 MG tablet, TAKE 1 TABLET BY MOUTH DAILY FOR ALLERGIES   fluticasone (FLONASE) 50 MCG/ACT nasal spray, Place 2 sprays into both nostrils daily.  Current Outpatient Medications (Analgesics):    aspirin 81 MG tablet, Take 81 mg by mouth daily.   meloxicam (MOBIC) 15 MG tablet, Take   1/2-1 tablet    Daily    with Food    for Pain & Inflammation & try LIMIT to 5 days /week to Avoid Kidney Damage   Current Outpatient Medications (Other):    baclofen (LIORESAL) 10 MG tablet, TAKE 1/2 TO 1 TABLET 1 OR 2 X /DAY AS NEEDED FOR MUSCLE SPASMS   Biotin 10 MG TABS, Take by mouth.   Brexpiprazole (REXULTI) 0.5 MG TABS, Take by mouth.   buPROPion (WELLBUTRIN XL) 300 MG 24 hr tablet, Take    1 tablet    every Morning    for Mood, Focus & Concentration   Cholecalciferol (VITAMIN D) 125 MCG (5000 UT) CAPS, Take 5,000 Units by mouth daily.    DULoxetine (CYMBALTA) 60 MG  capsule, TAKE 1 CAPSULE BY MOUTH EVERY DAY   Eszopiclone 3 MG TABS, TAKE 1 IMMEDIATELY BEFORE BEDTIME   fluocinolone (SYNALAR) 0.025 % ointment, APPLY TO AFFECTED AREA TWICE A DAY (Patient taking differently: as needed.)   fluocinonide (LIDEX) 0.05 % external solution, Apply 1 application topically 2 (two) times daily. (Patient taking differently: Apply 1 application topically as needed.)   gabapentin (NEURONTIN) 300 MG capsule, Take 1 capsule (300 mg total) by mouth 3 (three) times daily.   MELATONIN PO, Take 5 mg by mouth at bedtime.    Multiple Vitamin (MULTIVITAMIN) tablet, Take 1 tablet by mouth daily.   nystatin (MYCOSTATIN/NYSTOP) powder, Apply 1 application topically 2 (two) times daily.   omeprazole (  PRILOSEC) 40 MG capsule, TAKE 1 CAPSULE BY MOUTH EVERY DAY   triamcinolone ointment (KENALOG) 0.1 %, Apply 1 application topically 2 (two) times daily. (Patient taking differently: Apply 1 application topically daily as needed (rash).)   vitamin E 600 UNIT capsule, Take 600 Units by mouth daily.   azithromycin (ZITHROMAX) 250 MG tablet, Take 2 tablets (500 mg) on  Day 1,  followed by 1 tablet (250 mg) once daily on Days 2 through 5. (Patient not taking: Reported on 11/16/2020)   vitamin C (ASCORBIC ACID) 500 MG tablet, Take 500 mg by mouth daily. (Patient not taking: Reported on 11/16/2020)  Health Maintenance:   Immunization History  Administered Date(s) Administered   Influenza Inj Mdck Quad With Preservative 10/25/2016   Influenza, Seasonal, Injecte, Preservative Fre 01/01/2015   Influenza,inj,Quad PF,6+ Mos 10/25/2012, 01/20/2020   Influenza-Unspecified 06/05/2018, 12/06/2018   PFIZER(Purple Top)SARS-COV-2 Vaccination 05/03/2019, 05/29/2019   PPD Test 04/11/2013   Pneumococcal Polysaccharide-23 03/28/2012   Tdap 12/12/2012   Tetanus: 2014 Pneumovax: 2014 Flu vaccine: 2020 at [facility]  Pap: Lavoie, Dec 2019 MGM: 06/2019 Last Dental Exam: Yearly visit Colonoscopy: Referral  placed today   Patient Care Team: Lucky Cowboy, MD as PCP - General (Internal Medicine) Antionette Char, MD as Consulting Physician (Obstetrics and Gynecology) Cindee Salt, MD as Consulting Physician (Orthopedic Surgery) Lucky Cowboy, MD as Referring Physician (Internal Medicine)  Medical History:  Past Medical History:  Diagnosis Date   Anxiety    Arthritis    Depression    no meds   Diaphragmatic hernia with obstruction, without gangrene 11/07/2018   Dyspnea    doe with exertion occ   Family history of breast cancer    Family history of colon cancer    Family history of lung cancer    Family history of pancreatic cancer    Family history of prostate cancer    GERD (gastroesophageal reflux disease)    Hernia, diaphragmatic    History of kidney stones    Hyperlipidemia    Hypertension    no meds   Pneumonia    1 time   Allergies Allergies  Allergen Reactions   Doxycycline Shortness Of Breath and Anxiety   Erythromycin Nausea Only and Other (See Comments)    Stomach cramping (can take Z Pak)   Flexeril [Cyclobenzaprine Hcl] Other (See Comments)    "don't like the way I feel"   Metformin And Related Other (See Comments)    GI Upset    SURGICAL HISTORY She  has a past surgical history that includes Dilation and evacuation; Dilation and curettage of uterus; Video assisted thoracoscopy (vats)/thorocotomy (Right, 03/23/2018); epigastric hernia repair (N/A, 03/23/2018); IR THORACENTESIS ASP PLEURAL SPACE W/IMG GUIDE (04/19/2018); and Dilatation & curettage/hysteroscopy with myosure (N/A, 10/18/2019).   FAMILY HISTORY Her family history includes Breast cancer in her cousin and maternal aunt; Breast cancer (age of onset: 78) in her mother; Cervical cancer in her paternal aunt; Colon cancer in her maternal aunt, maternal aunt, maternal uncle, and maternal uncle; Colon polyps in her maternal aunt and mother; Congestive Heart Failure in her mother; Diabetes in her brother,  maternal aunt, maternal uncle, and mother; Heart disease in her maternal aunt, maternal grandmother, and maternal uncle; Hemochromatosis in her paternal uncle; Hyperlipidemia in her brother, maternal aunt, maternal uncle, and mother; Hypertension in her brother, maternal aunt, maternal uncle, and mother; Kidney disease in her mother; Lung cancer in her mother; Ovarian cancer in her paternal aunt; Pancreatic cancer in her maternal aunt; Polycystic ovary  syndrome in her paternal aunt; Prostate cancer in her cousin, cousin, and maternal uncle; Stroke in her maternal aunt and maternal uncle; Throat cancer in her maternal aunt.   SOCIAL HISTORY She  reports that she has never smoked. She has never used smokeless tobacco. She reports that she does not currently use alcohol. She reports that she does not use drugs.  Review of Systems: Review of Systems  Constitutional:  Negative for chills, fever and malaise/fatigue.  HENT:  Negative for congestion, ear pain and sore throat.   Eyes: Negative.   Respiratory:  Negative for cough, shortness of breath and wheezing.   Cardiovascular:  Negative for chest pain, palpitations and leg swelling.  Gastrointestinal:  Positive for heartburn. Negative for abdominal pain, blood in stool, constipation, diarrhea, melena, nausea and vomiting.  Genitourinary:  Negative for dysuria, frequency, hematuria and urgency.  Musculoskeletal:  Negative for back pain, joint pain, myalgias and neck pain.  Neurological:  Negative for dizziness, loss of consciousness and headaches.  Psychiatric/Behavioral:  Positive for depression. The patient is not nervous/anxious and does not have insomnia.        Compulsive need to shop daily   Physical Exam: Estimated body mass index is 44.06 kg/m as calculated from the following:   Height as of 08/11/20: 5\' 1"  (1.549 m).   Weight as of this encounter: 233 lb 3.2 oz (105.8 kg). BP 112/78   Pulse 93   Temp 97.6 F (36.4 C)   Wt 233 lb 3.2 oz  (105.8 kg)   SpO2 97%   BMI 44.06 kg/m   General Appearance: Obese pleasant female, in no apparent distress.  Eyes: PERRLA, EOMs, conjunctiva no swelling or erythema ENT/Mouth: Ear canals normal without obstruction, swelling, erythema, or discharge.  TMs normal bilaterally with no erythema, bulging, retraction, or loss of landmark.  Oropharynx moist and clear with no exudate, erythema, or swelling.  Neck: Supple, thyroid normal. No bruits.  No cervical adenopathy Respiratory: Respiratory effort normal, Breath sounds clear A&P without wheeze, rhonchi, rales.   Cardio: RRR without murmurs, rubs or gallops. Brisk peripheral pulses without edema.  Abdomen: Soft, nontender, no guarding, rebound, hernias, masses, or organomegaly.  Lymphatics: Non tender without lymphadenopathy.  Musculoskeletal: Full ROM all peripheral extremities,5/5 strength, and normal gait.  Skin: Warm, dry without rashes, lesions, ecchymosis. Neuro: Awake and oriented X 3, Cranial nerves intact, reflexes equal bilaterally. Normal muscle tone, no cerebellar symptoms.  Psych:  normal affect, Insight and Judgment appropriate.    ANP-C  [facility]  11/16/2020

## 2020-11-16 ENCOUNTER — Other Ambulatory Visit: Payer: Self-pay

## 2020-11-16 ENCOUNTER — Ambulatory Visit: Payer: BC Managed Care – PPO | Admitting: Nurse Practitioner

## 2020-11-16 ENCOUNTER — Encounter: Payer: Self-pay | Admitting: Nurse Practitioner

## 2020-11-16 VITALS — BP 112/78 | HR 93 | Temp 97.6°F | Wt 233.2 lb

## 2020-11-16 DIAGNOSIS — E559 Vitamin D deficiency, unspecified: Secondary | ICD-10-CM | POA: Diagnosis not present

## 2020-11-16 DIAGNOSIS — R7309 Other abnormal glucose: Secondary | ICD-10-CM

## 2020-11-16 DIAGNOSIS — Z79899 Other long term (current) drug therapy: Secondary | ICD-10-CM | POA: Diagnosis not present

## 2020-11-16 DIAGNOSIS — I10 Essential (primary) hypertension: Secondary | ICD-10-CM | POA: Diagnosis not present

## 2020-11-16 DIAGNOSIS — E782 Mixed hyperlipidemia: Secondary | ICD-10-CM

## 2020-11-16 DIAGNOSIS — K219 Gastro-esophageal reflux disease without esophagitis: Secondary | ICD-10-CM

## 2020-11-16 DIAGNOSIS — F330 Major depressive disorder, recurrent, mild: Secondary | ICD-10-CM

## 2020-11-16 DIAGNOSIS — F419 Anxiety disorder, unspecified: Secondary | ICD-10-CM | POA: Diagnosis not present

## 2020-11-16 DIAGNOSIS — Z23 Encounter for immunization: Secondary | ICD-10-CM | POA: Diagnosis not present

## 2020-11-16 DIAGNOSIS — F331 Major depressive disorder, recurrent, moderate: Secondary | ICD-10-CM | POA: Diagnosis not present

## 2020-11-16 DIAGNOSIS — F4310 Post-traumatic stress disorder, unspecified: Secondary | ICD-10-CM | POA: Diagnosis not present

## 2020-11-16 MED ORDER — MOUNJARO 2.5 MG/0.5ML ~~LOC~~ SOAJ: 2.5000 mg | SUBCUTANEOUS | 1 refills | Status: DC

## 2020-11-16 NOTE — Patient Instructions (Signed)
Tirzepatide Injection What is this medication? TIRZEPATIDE (tir ZEP a tide) treats type 2 diabetes. It works by increasing insulin levels in your body, which decreases your blood sugar (glucose). Changes to diet and exercise are often combined with this medication. This medicine may be used for other purposes; ask your health care provider or pharmacist if you have questions. COMMON BRAND NAME(S): MOUNJARO What should I tell my care team before I take this medication? They need to know if you have any of these conditions: Endocrine tumors (MEN 2) or if someone in your family had these tumors Eye disease, vision problems Gallbladder disease History of pancreatitis Kidney disease Stomach or intestine problems Thyroid cancer or if someone in your family had thyroid cancer An unusual or allergic reaction to tirzepatide, other medications, foods, dyes, or preservatives Pregnant or trying to get pregnant Breast-feeding How should I use this medication? This medication is injected under the skin. You will be taught how to prepare and give it. It is given once every week (every 7 days). Keep taking it unless your health care provider tells you to stop. If you use this medication with insulin, you should inject this medication and the insulin separately. Do not mix them together. Do not give the injections right next to each other. Change (rotate) injection sites with each injection. This medication comes with INSTRUCTIONS FOR USE. Ask your pharmacist for directions on how to use this medication. Read the information carefully. Talk to your pharmacist or care team if you have questions. It is important that you put your used needles and syringes in a special sharps container. Do not put them in a trash can. If you do not have a sharps container, call your pharmacist or care team to get one. A special MedGuide will be given to you by the pharmacist with each prescription and refill. Be sure to read this  information carefully each time. Talk to your care team about the use of this medication in children. Special care may be needed. Overdosage: If you think you have taken too much of this medicine contact a poison control center or emergency room at once. NOTE: This medicine is only for you. Do not share this medicine with others. What if I miss a dose? If you miss a dose, take it as soon as you can unless it is more than 4 days (96 hours) late. If it is more than 4 days late, skip the missed dose. Take the next dose at the normal time. Do not take 2 doses within 3 days of each other. What may interact with this medication? Alcohol containing beverages Antiviral medications for HIV or AIDS Aspirin and aspirin-like medications Beta-blockers like atenolol, metoprolol, propranolol Certain medications for blood pressure, heart disease, irregular heart beat Chromium Clonidine Diuretics Female hormones, such as estrogens or progestins, birth control pills Fenofibrate Gemfibrozil Guanethidine Isoniazid Lanreotide Female hormones or anabolic steroids MAOIs like Carbex, Eldepryl, Marplan, Nardil, and Parnate Medications for weight loss Medications for allergies, asthma, cold, or cough Medications for depression, anxiety, or psychotic disturbances Niacin Nicotine NSAIDs, medications for pain and inflammation, like ibuprofen or naproxen Octreotide Other medications for diabetes, like glyburide, glipizide, or glimepiride Pasireotide Pentamidine Phenytoin Probenecid Quinolone antibiotics such as ciprofloxacin, levofloxacin, ofloxacin Reserpine Some herbal dietary supplements Steroid medications such as prednisone or cortisone Sulfamethoxazole; trimethoprim Thyroid hormones Warfarin This list may not describe all possible interactions. Give your health care provider a list of all the medicines, herbs, non-prescription drugs, or dietary supplements you   use. Also tell them if you smoke, drink  alcohol, or use illegal drugs. Some items may interact with your medicine. What should I watch for while using this medication? Visit your care team for regular checks on your progress. Drink plenty of fluids while taking this medication. Check with your care team if you get an attack of severe diarrhea, nausea, and vomiting. The loss of too much body fluid can make it dangerous for you to take this medication. A test called the HbA1C (A1C) will be monitored. This is a simple blood test. It measures your blood sugar control over the last 2 to 3 months. You will receive this test every 3 to 6 months. Learn how to check your blood sugar. Learn the symptoms of low and high blood sugar and how to manage them. Always carry a quick-source of sugar with you in case you have symptoms of low blood sugar. Examples include hard sugar candy or glucose tablets. Make sure others know that you can choke if you eat or drink when you develop serious symptoms of low blood sugar, such as seizures or unconsciousness. They must get medical help at once. Tell your care team if you have high blood sugar. You might need to change the dose of your medication. If you are sick or exercising more than usual, you might need to change the dose of your medication. Do not skip meals. Ask your care team if you should avoid alcohol. Many nonprescription cough and cold products contain sugar or alcohol. These can affect blood sugar. Pens should never be shared. Even if the needle is changed, sharing may result in passing of viruses like hepatitis or HIV. Wear a medical ID bracelet or chain, and carry a card that describes your disease and details of your medication and dosage times. Birth control may not work properly while you are taking this medication. If you take birth control pills by mouth, your care team may recommend another type of birth control for 4 weeks after you start this medication and for 4 weeks after each increase in  your dose of this medication. Ask your care team which birth control methods you should use. What side effects may I notice from receiving this medication? Side effects that you should report to your care team as soon as possible: Allergic reactions-skin rash, itching, hives, swelling of the face, lips, tongue, or throat Change in vision Dehydration-increased thirst, dry mouth, feeling faint or lightheaded, headache, dark yellow or brown urine Gallbladder problems-severe stomach pain, nausea, vomiting, fever Kidney injury-decrease in the amount of urine, swelling of the ankles, hands, or feet Pancreatitis-severe stomach pain that spreads to your back or gets worse after eating or when touched, fever, nausea, vomiting Thyroid cancer-new mass or lump in the neck, pain or trouble swallowing, trouble breathing, hoarseness Side effects that usually do not require medical attention (report these to your care team if they continue or are bothersome): Constipation Diarrhea Loss of Appetite Nausea Stomach pain Upset stomach Vomiting This list may not describe all possible side effects. Call your doctor for medical advice about side effects. You may report side effects to FDA at 1-800-FDA-1088. Where should I keep my medication? Keep out of the reach of children and pets. Refrigeration (preferred): Store unopened pens in a refrigerator between 2 and 8 degrees C (36 and 46 degrees F). Keep it in the original carton until you are ready to take it. Do not freeze or use if the medication has been frozen.   Protect from light. Get rid of any unused medication after the expiration date on the label. Room Temperature: The pen may be stored at room temperature below 30 degrees C (86 degrees F) for up to a total of 21 days if needed. Protect from light. Avoid exposure to extreme heat. If it is stored at room temperature, throw away any unused medication after 21 days or after it expires, whichever is first. The  pen has glass parts. Handle it carefully. If you drop the pen on a hard surface, do not use it. Use a new pen for your injection. To get rid of medications that are no longer needed or have expired: Take the medication to a medication take-back program. Check with your pharmacy or law enforcement to find a location. If you cannot return the medication, ask your pharmacist or care team how to get rid of this medication safely. NOTE: This sheet is a summary. It may not cover all possible information. If you have questions about this medicine, talk to your doctor, pharmacist, or health care provider.  2022 [employer]/Gold Standard (2020-06-16 13:57:48)  

## 2020-11-17 LAB — CBC WITH DIFFERENTIAL/PLATELET
Absolute Monocytes: 624 cells/uL (ref 200–950)
Basophils Absolute: 20 cells/uL (ref 0–200)
Basophils Relative: 0.3 %
Eosinophils Absolute: 117 cells/uL (ref 15–500)
Eosinophils Relative: 1.8 %
HCT: 39.3 % (ref 35.0–45.0)
Hemoglobin: 13 g/dL (ref 11.7–15.5)
Lymphs Abs: 1931 cells/uL (ref 850–3900)
MCH: 31.9 pg (ref 27.0–33.0)
MCHC: 33.1 g/dL (ref 32.0–36.0)
MCV: 96.3 fL (ref 80.0–100.0)
MPV: 9.4 fL (ref 7.5–12.5)
Monocytes Relative: 9.6 %
Neutro Abs: 3809 cells/uL (ref 1500–7800)
Neutrophils Relative %: 58.6 %
Platelets: 293 10*3/uL (ref 140–400)
RBC: 4.08 10*6/uL (ref 3.80–5.10)
RDW: 11.9 % (ref 11.0–15.0)
Total Lymphocyte: 29.7 %
WBC: 6.5 10*3/uL (ref 3.8–10.8)

## 2020-11-17 LAB — LIPID PANEL
Cholesterol: 163 mg/dL (ref <200)
HDL: 60 mg/dL (ref >=50)
LDL Cholesterol (Calc): 75 mg/dL (calc)
Non-HDL Cholesterol (Calc): 103 mg/dL (calc) (ref <130)
Total CHOL/HDL Ratio: 2.7 (calc) (ref <5.0)
Triglycerides: 187 mg/dL — ABNORMAL HIGH (ref <150)

## 2020-11-17 LAB — COMPLETE METABOLIC PANEL WITH GFR
AG Ratio: 1.8 (calc) (ref 1.0–2.5)
ALT: 22 U/L (ref 6–29)
AST: 19 U/L (ref 10–35)
Albumin: 4.5 g/dL (ref 3.6–5.1)
Alkaline phosphatase (APISO): 67 U/L (ref 37–153)
BUN: 13 mg/dL (ref 7–25)
CO2: 28 mmol/L (ref 20–32)
Calcium: 9.5 mg/dL (ref 8.6–10.4)
Chloride: 103 mmol/L (ref 98–110)
Creat: 1.03 mg/dL (ref 0.50–1.03)
Globulin: 2.5 g/dL (calc) (ref 1.9–3.7)
Glucose, Bld: 97 mg/dL (ref 65–99)
Potassium: 4.2 mmol/L (ref 3.5–5.3)
Sodium: 141 mmol/L (ref 135–146)
Total Bilirubin: 0.6 mg/dL (ref 0.2–1.2)
Total Protein: 7 g/dL (ref 6.1–8.1)
eGFR: 66 mL/min/{1.73_m2} (ref >=60)

## 2020-11-17 LAB — MAGNESIUM: Magnesium: 2.1 mg/dL (ref 1.5–2.5)

## 2020-11-17 LAB — VITAMIN D 25 HYDROXY (VIT D DEFICIENCY, FRACTURES): Vit D, 25-Hydroxy: 49 ng/mL (ref 30–100)

## 2020-11-20 DIAGNOSIS — G4733 Obstructive sleep apnea (adult) (pediatric): Secondary | ICD-10-CM | POA: Diagnosis not present

## 2020-11-23 DIAGNOSIS — G4733 Obstructive sleep apnea (adult) (pediatric): Secondary | ICD-10-CM | POA: Diagnosis not present

## 2020-11-24 ENCOUNTER — Other Ambulatory Visit: Payer: Self-pay | Admitting: Nurse Practitioner

## 2020-11-24 MED ORDER — HYDROCHLOROTHIAZIDE 12.5 MG PO TABS: 25.0000 mg | ORAL_TABLET | Freq: Every day | ORAL | 3 refills | Status: DC

## 2020-12-09 ENCOUNTER — Other Ambulatory Visit: Payer: Self-pay | Admitting: Internal Medicine

## 2020-12-09 DIAGNOSIS — F330 Major depressive disorder, recurrent, mild: Secondary | ICD-10-CM

## 2020-12-15 ENCOUNTER — Other Ambulatory Visit: Payer: Self-pay | Admitting: Nurse Practitioner

## 2020-12-15 DIAGNOSIS — R7309 Other abnormal glucose: Secondary | ICD-10-CM

## 2020-12-15 MED ORDER — MOUNJARO 7.5 MG/0.5ML ~~LOC~~ SOAJ: 7.5000 mg | SUBCUTANEOUS | 3 refills | Status: DC

## 2020-12-16 DIAGNOSIS — F4310 Post-traumatic stress disorder, unspecified: Secondary | ICD-10-CM | POA: Diagnosis not present

## 2020-12-16 DIAGNOSIS — F419 Anxiety disorder, unspecified: Secondary | ICD-10-CM | POA: Diagnosis not present

## 2020-12-16 DIAGNOSIS — F331 Major depressive disorder, recurrent, moderate: Secondary | ICD-10-CM | POA: Diagnosis not present

## 2020-12-17 ENCOUNTER — Other Ambulatory Visit: Payer: Self-pay | Admitting: Nurse Practitioner

## 2020-12-17 ENCOUNTER — Encounter: Payer: Self-pay | Admitting: Nurse Practitioner

## 2020-12-17 DIAGNOSIS — R7309 Other abnormal glucose: Secondary | ICD-10-CM

## 2020-12-18 DIAGNOSIS — F331 Major depressive disorder, recurrent, moderate: Secondary | ICD-10-CM | POA: Diagnosis not present

## 2020-12-21 DIAGNOSIS — G4733 Obstructive sleep apnea (adult) (pediatric): Secondary | ICD-10-CM | POA: Diagnosis not present

## 2020-12-24 DIAGNOSIS — G4733 Obstructive sleep apnea (adult) (pediatric): Secondary | ICD-10-CM | POA: Diagnosis not present

## 2020-12-28 ENCOUNTER — Telehealth: Payer: Self-pay

## 2020-12-28 NOTE — Telephone Encounter (Signed)
Prior Auth for Ozempic was denied.

## 2020-12-31 ENCOUNTER — Telehealth: Payer: Self-pay

## 2020-12-31 ENCOUNTER — Encounter: Payer: Self-pay | Admitting: Nurse Practitioner

## 2020-12-31 NOTE — Telephone Encounter (Signed)
Prior Auth for Ozempic denied. 

## 2021-01-02 ENCOUNTER — Other Ambulatory Visit: Payer: Self-pay | Admitting: Nurse Practitioner

## 2021-01-02 DIAGNOSIS — R7309 Other abnormal glucose: Secondary | ICD-10-CM

## 2021-01-02 MED ORDER — OZEMPIC (1 MG/DOSE) 4 MG/3ML ~~LOC~~ SOPN: 1.0000 mg | PEN_INJECTOR | SUBCUTANEOUS | 3 refills | Status: DC

## 2021-01-03 ENCOUNTER — Other Ambulatory Visit: Payer: Self-pay | Admitting: Nurse Practitioner

## 2021-01-03 DIAGNOSIS — R7309 Other abnormal glucose: Secondary | ICD-10-CM

## 2021-01-04 ENCOUNTER — Encounter: Payer: Self-pay | Admitting: Nurse Practitioner

## 2021-01-07 ENCOUNTER — Other Ambulatory Visit: Payer: Self-pay | Admitting: Nurse Practitioner

## 2021-01-07 ENCOUNTER — Encounter: Payer: Self-pay | Admitting: Nurse Practitioner

## 2021-01-07 DIAGNOSIS — R7309 Other abnormal glucose: Secondary | ICD-10-CM

## 2021-01-07 MED ORDER — OZEMPIC (1 MG/DOSE) 4 MG/3ML ~~LOC~~ SOPN
1.0000 mg | PEN_INJECTOR | SUBCUTANEOUS | 3 refills | Status: DC
Start: 2021-01-07 — End: 2021-01-18

## 2021-01-13 NOTE — Progress Notes (Signed)
Assessment and Plan: Samantha Greene was seen today for follow-up.  Diagnoses and all orders for this visit:  Morbid obesity (HCC) -     Semaglutide-Weight Management (WEGOVY) 1 MG/0.5ML SOAJ; Inject 1 mg into the skin once a week. Continue diet and exercise Try increasing protein and avoid skipping meals  Mild episode of recurrent major depressive disorder (HCC) Continue Wellbutrin and behavior modifications such as relaxation techniques, exercise  Class 2 obesity with alveolar hypoventilation, serious comorbidity, and body mass index (BMI) of 38.0 to 38.9 in adult (HCC) -     Semaglutide-Weight Management (WEGOVY) 1 MG/0.5ML SOAJ; Inject 1 mg into the skin once a week. Started on Saxenda sample and will hopefully be able to transition to [facility]  OSA on CPAP Continue CPAP Focus on weight loss        Further disposition pending results of labs. Discussed med's effects and SE's.   Over 30 minutes of exam, counseling, chart review, and critical decision making was performed.   Future Appointments  Date Time Provider Department Center  02/22/2021 12:45 PM [name], NP [facility] None  02/24/2021  4:30 PM Genia Del, MD [facility] None  03/17/2021 10:00 AM Revonda Humphrey, NP [facility] None    ------------------------------------------------------------------------------------------------------------------   HPI BP 128/80    Pulse 73    Temp (!) 97.5 F (36.4 C)    Wt 232 lb (105.2 kg)    SpO2 99%    BMI 43.84 kg/m  49 y.o.female presents for reevaluation of obesity  BMI is Body mass index is 43.84 kg/m., she has been working on diet and exercise. She has tried taking Phentermine in the past and the medicine made her jittery and she was unable to sleep. She is walking 3 times a week for 30 minutes at the [facility]. She has been trying to eat more fresh fruits and vegetables. Her insurance would not cover Mounjaro or Ozempic.  Wt Readings from Last 3 Encounters:  01/18/21 232 lb  (105.2 kg)  11/16/20 233 lb 3.2 oz (105.8 kg)  08/11/20 225 lb 5 oz (102.2 kg)   Pt is currently taking micardis, HCTZ12.5mg  daily and BP is controlled BP Readings from Last 3 Encounters:  01/18/21 128/80  11/16/20 112/78  08/11/20 116/64   Continue diet and meds as discussed. Further disposition pending results of labs. Discussed med's effects and SE's.   Over 20 minutes of exam, counseling, chart review, and critical decision making was performed.   Past Medical History:  Diagnosis Date   Anxiety    Arthritis    Depression    no meds   Diaphragmatic hernia with obstruction, without gangrene 11/07/2018   Dyspnea    doe with exertion occ   Family history of breast cancer    Family history of colon cancer    Family history of lung cancer    Family history of pancreatic cancer    Family history of prostate cancer    GERD (gastroesophageal reflux disease)    Hernia, diaphragmatic    History of kidney stones    Hyperlipidemia    Hypertension    no meds   Pneumonia    1 time     Allergies  Allergen Reactions   Doxycycline Shortness Of Breath and Anxiety   Erythromycin Nausea Only and Other (See Comments)    Stomach cramping (can take Z Pak)   Flexeril [Cyclobenzaprine Hcl] Other (See Comments)    "don't like the way I feel"   Metformin And Related Other (See  Comments)    GI Upset    Current Outpatient Medications on File Prior to Visit  Medication Sig   albuterol (VENTOLIN HFA) 108 (90 Base) MCG/ACT inhaler Inhale 2 puffs into the lungs every 4 (four) hours as needed for wheezing or shortness of breath.   aspirin 81 MG tablet Take 81 mg by mouth daily.   baclofen (LIORESAL) 10 MG tablet TAKE 1/2 TO 1 TABLET 1 OR 2 X /DAY AS NEEDED FOR MUSCLE SPASMS   Biotin 10 MG TABS Take by mouth.   Brexpiprazole (REXULTI) 0.5 MG TABS Take by mouth.   buPROPion (WELLBUTRIN XL) 300 MG 24 hr tablet TAKE 1 TABLET EVERY MORNING FOR MOOD, FOCUS & CONCENTRATION   Cholecalciferol (VITAMIN  D) 125 MCG (5000 UT) CAPS Take 5,000 Units by mouth daily.    DULoxetine (CYMBALTA) 60 MG capsule TAKE 1 CAPSULE BY MOUTH EVERY DAY   Eszopiclone 3 MG TABS TAKE 1 IMMEDIATELY BEFORE BEDTIME   fexofenadine (ALLEGRA) 180 MG tablet Take 180 mg by mouth daily as needed for allergies or rhinitis. Alternates with zyrtec when Zyrtec seems to not be as effective   fluocinolone (SYNALAR) 0.025 % ointment APPLY TO AFFECTED AREA TWICE A DAY (Patient taking differently: as needed.)   fluocinonide (LIDEX) 0.05 % external solution Apply 1 application topically 2 (two) times daily. (Patient taking differently: Apply 1 application topically as needed.)   gabapentin (NEURONTIN) 300 MG capsule Take 1 capsule (300 mg total) by mouth 3 (three) times daily.   hydrochlorothiazide (HYDRODIURIL) 12.5 MG tablet Take 2 tablets (25 mg total) by mouth daily.   levocetirizine (XYZAL) 5 MG tablet Take 5 mg by mouth at bedtime.   MELATONIN PO Take 5 mg by mouth at bedtime.    meloxicam (MOBIC) 15 MG tablet Take   1/2-1 tablet    Daily    with Food    for Pain & Inflammation & try LIMIT to 5 days /week to Avoid Kidney Damage   montelukast (SINGULAIR) 10 MG tablet TAKE 1 TABLET BY MOUTH DAILY FOR ALLERGIES   Multiple Vitamin (MULTIVITAMIN) tablet Take 1 tablet by mouth daily.   norethindrone (MICRONOR) 0.35 MG tablet Take 1 tablet (0.35 mg total) by mouth daily. (Patient taking differently: Take 1 tablet by mouth daily. prn)   nystatin (MYCOSTATIN/NYSTOP) powder Apply 1 application topically 2 (two) times daily.   omeprazole (PRILOSEC) 40 MG capsule TAKE 1 CAPSULE BY MOUTH EVERY DAY   rosuvastatin (CRESTOR) 5 MG tablet Take  1 tablet  Daily for Cholesterol   telmisartan (MICARDIS) 40 MG tablet TAKE 1 TABLET BY MOUTH EVERY DAY FOR BLOOD PRESSURE   triamcinolone ointment (KENALOG) 0.1 % Apply 1 application topically 2 (two) times daily. (Patient taking differently: Apply 1 application topically daily as needed (rash).)   vitamin E  600 UNIT capsule Take 600 Units by mouth daily.   fluticasone (FLONASE) 50 MCG/ACT nasal spray Place 2 sprays into both nostrils daily.   No current facility-administered medications on file prior to visit.    ROS: all negative except above.   Physical Exam:  BP 128/80    Pulse 73    Temp (!) 97.5 F (36.4 C)    Wt 232 lb (105.2 kg)    SpO2 99%    BMI 43.84 kg/m   General Appearance: Pleasant obese female in no apparent distress. Eyes: PERRLA, EOMs, conjunctiva no swelling or erythema Sinuses: No Frontal/maxillary tenderness ENT/Mouth: Ext aud canals clear, TMs without erythema, bulging. No erythema, swelling, or  exudate on post pharynx.  Tonsils not swollen or erythematous. Hearing normal.  Neck: Supple, thyroid normal.  Respiratory: Respiratory effort normal, BS equal bilaterally without rales, rhonchi, wheezing or stridor.  Cardio: RRR with no MRGs. Brisk peripheral pulses without edema.  Abdomen: Soft, + BS.  Non tender, no guarding, rebound, hernias, masses. Lymphatics: Non tender without lymphadenopathy.  Musculoskeletal: Full ROM, 5/5 strength, normal gait.  Skin: Warm, dry without rashes, lesions, ecchymosis.  Neuro: Cranial nerves intact. Normal muscle tone, no cerebellar symptoms. Sensation intact.  Psych: Awake and oriented X 3, normal affect, Insight and Judgment appropriate.     Revonda Humphrey, NP 2:56 PM [facility]

## 2021-01-18 ENCOUNTER — Other Ambulatory Visit: Payer: Self-pay

## 2021-01-18 ENCOUNTER — Encounter: Payer: Self-pay | Admitting: Nurse Practitioner

## 2021-01-18 ENCOUNTER — Ambulatory Visit: Payer: BC Managed Care – PPO | Admitting: Nurse Practitioner

## 2021-01-18 DIAGNOSIS — Z6838 Body mass index (BMI) 38.0-38.9, adult: Secondary | ICD-10-CM

## 2021-01-18 DIAGNOSIS — G4733 Obstructive sleep apnea (adult) (pediatric): Secondary | ICD-10-CM | POA: Diagnosis not present

## 2021-01-18 DIAGNOSIS — Z9989 Dependence on other enabling machines and devices: Secondary | ICD-10-CM

## 2021-01-18 DIAGNOSIS — F330 Major depressive disorder, recurrent, mild: Secondary | ICD-10-CM

## 2021-01-18 DIAGNOSIS — E662 Morbid (severe) obesity with alveolar hypoventilation: Secondary | ICD-10-CM | POA: Diagnosis not present

## 2021-01-18 MED ORDER — WEGOVY 1 MG/0.5ML ~~LOC~~ SOAJ: 1.0000 mg | SUBCUTANEOUS | 3 refills | Status: DC

## 2021-01-19 ENCOUNTER — Other Ambulatory Visit: Payer: Self-pay | Admitting: Adult Health Nurse Practitioner

## 2021-01-19 ENCOUNTER — Telehealth: Payer: Self-pay

## 2021-01-19 DIAGNOSIS — F330 Major depressive disorder, recurrent, mild: Secondary | ICD-10-CM

## 2021-01-19 NOTE — Telephone Encounter (Signed)
Prior Auth for Ozempic was denied.

## 2021-01-20 ENCOUNTER — Ambulatory Visit: Payer: Self-pay | Admitting: Obstetrics & Gynecology

## 2021-01-20 DIAGNOSIS — G4733 Obstructive sleep apnea (adult) (pediatric): Secondary | ICD-10-CM | POA: Diagnosis not present

## 2021-01-23 DIAGNOSIS — G4733 Obstructive sleep apnea (adult) (pediatric): Secondary | ICD-10-CM | POA: Diagnosis not present

## 2021-02-03 ENCOUNTER — Other Ambulatory Visit: Payer: Self-pay | Admitting: Obstetrics & Gynecology

## 2021-02-03 NOTE — Telephone Encounter (Signed)
AEX 01/19/21. Scheduled AEX 02/24/21.

## 2021-02-10 DIAGNOSIS — F4310 Post-traumatic stress disorder, unspecified: Secondary | ICD-10-CM | POA: Diagnosis not present

## 2021-02-10 DIAGNOSIS — F331 Major depressive disorder, recurrent, moderate: Secondary | ICD-10-CM | POA: Diagnosis not present

## 2021-02-10 DIAGNOSIS — F419 Anxiety disorder, unspecified: Secondary | ICD-10-CM | POA: Diagnosis not present

## 2021-02-20 DIAGNOSIS — G4733 Obstructive sleep apnea (adult) (pediatric): Secondary | ICD-10-CM | POA: Diagnosis not present

## 2021-02-21 ENCOUNTER — Encounter: Payer: Self-pay | Admitting: Adult Health

## 2021-02-22 ENCOUNTER — Ambulatory Visit: Payer: BC Managed Care – PPO | Admitting: Adult Health

## 2021-02-22 ENCOUNTER — Encounter: Payer: Self-pay | Admitting: Adult Health

## 2021-02-22 VITALS — BP 135/92 | HR 85 | Ht 61.0 in | Wt 234.0 lb

## 2021-02-22 DIAGNOSIS — Z9989 Dependence on other enabling machines and devices: Secondary | ICD-10-CM | POA: Diagnosis not present

## 2021-02-22 DIAGNOSIS — G4733 Obstructive sleep apnea (adult) (pediatric): Secondary | ICD-10-CM

## 2021-02-22 DIAGNOSIS — G4719 Other hypersomnia: Secondary | ICD-10-CM | POA: Diagnosis not present
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
## 2021-02-24 ENCOUNTER — Ambulatory Visit: Payer: BC Managed Care – PPO | Admitting: Obstetrics & Gynecology

## 2021-02-26 ENCOUNTER — Encounter: Payer: Self-pay | Admitting: Nurse Practitioner

## 2021-02-27 ENCOUNTER — Other Ambulatory Visit: Payer: Self-pay | Admitting: Nurse Practitioner

## 2021-03-09 ENCOUNTER — Encounter: Payer: Self-pay | Admitting: Nurse Practitioner

## 2021-03-15 NOTE — Progress Notes (Signed)
COMPLETE PHYSICAL   Assessment and Plan:  Encounter for general adult medical examination with abnormal findings Yearly  Essential hypertension - continue medications, DASH diet, exercise and monitor at home. Call if greater than 130/80.  -     CBC with Differential/Platelet -     COMPLETE METABOLIC PANEL WITH GFR -     TSH -     Urinalysis, Routine w reflex microscopic -     Microalbumin / creatinine urine ratio -     EKG 12-Lead  Mixed hyperlipidemia -     Lipid panel Continue rosuvastatin check lipids decrease fatty foods increase activity.   Morbid obesity ([place]) - follow up 3 months for progress monitoring, unable to get GLP1 approved through insurance - increase veggies, decrease carbs, increase protein- try protein shake for breakfast and lunch - long discussion about weight loss, diet, and exercise  Psoriasis, guttate Monitor  Vitamin D deficiency -     VITAMIN D 25 Hydroxy (Vit-D Deficiency, Fractures)  OSA on CPAP Continue CPAP nightly   Mild episode of recurrent major depressive disorder (HCC) Continue wellbutrin XL 300mg , rexulti and Cymbalta Monitor symptoms and notify office if they are not controlled  Insomnia, unspecified type Continue medications - follow up neuro  Medication management Continued  Abnormal glucose Continue diet and exercise -     Hemoglobin A1c  Screening for colorectal cancer Refer to GI for colonoscopy  Screening for breast Cancer Will call breast center for mammogram  Screening for blood or protein in urine Routine UA with reflex microscopic Microalbumin/creatinine urine ratio  Screening for ischemic heart disease EKG  Contact office with any new or worsening symptoms   Discussed med's effects and SE's. Screening labs and tests as requested with regular follow-up as recommended.  Further disposition pending results if labs check today. Discussed med's effects and SE's.   Over 30 minutes of face to face  interview, exam, counseling, chart review, and critical decision making was performed.    Future Appointments  Date Time Provider [place]  05/18/2021  2:30 PM Princess Bruins, MD [facility] None  08/24/2021  1:15 PM Frann Rider, NP [facility] None  03/17/2022 10:00 AM Magda Bernheim, NP [facility] None    HPI  51 y.o. female  presents for a complete physical.  She reports that she is taking arthritis medicine, meloxicam PRN, not daily. She report breakthrough pain, joints, multiple.  Has history of depression. Currently on Buproprion 300 mg, rexulti 1mg  daily, cymbalta 60 mg daily. Is managed but is having a lot of stress due to weight.   BMI is Body mass index is 45.2 kg/m., she is working on diet and exercise. She continues to try walking. Her husband and her go out to eat a lot.  Does not eat healthy- states her diet is unhealthy but with smaller portions.  She sits and watches TV.  Wt Readings from Last 3 Encounters:  03/17/21 239 lb 3.2 oz (108.5 kg)  02/22/21 234 lb (106.1 kg)  01/18/21 232 lb (105.2 kg)   She is s/p VATS 03/23/2018, was still having SOB with exertion this has gotten worse with weight gain. She is on Singulair and Xyzal daily, uses albuterol inhaler as needed.     She wears this daily and general fatigued.  And had a sleep study that showed severe OSA with AHI 37.5/h and possible central sleep apnea characteristics.  and is now on a CPAP x Nov, uses [employer]. She is taking gabapentin once a day  at bedtime.   Her blood pressure has been controlled at home, today their BP is BP: 118/82.   BP Readings from Last 3 Encounters:  03/17/21 118/82  02/22/21 (!) 135/92  01/18/21 128/80   She does workout. She denies chest pain, shortness of breath, dizziness.   She is on cholesterol medication and denies myalgias. Her cholesterol is at goal. The cholesterol last visit was:  Lab Results  Component Value Date   CHOL 163 11/16/2020   HDL 60 11/16/2020    LDLCALC 75 11/16/2020   TRIG 187 (H) 11/16/2020   CHOLHDL 2.7 11/16/2020  . She has been working on diet and exercise for prediabetes, she is on bASA, she is on ACE/ARB and denies foot ulcerations, hyperglycemia, hypoglycemia , increased appetite, nausea, paresthesia of the feet, polydipsia, polyuria, visual disturbances, vomiting and weight loss. Last A1C in the office was:  Lab Results  Component Value Date   HGBA1C 5.5 03/17/2020   Patient is on Vitamin D supplement, she is on 5000 daily.   Lab Results  Component Value Date   VD25OH 51 11/16/2020     She was to have nasal surgery with Dr. Lucia Gaskins but backed out of the surgery.   Current Medications:   Current Outpatient Medications (Endocrine & Metabolic):    norethindrone (MICRONOR) 0.35 MG tablet, TAKE 1 TABLET BY MOUTH EVERY DAY  Current Outpatient Medications (Cardiovascular):    hydrochlorothiazide (HYDRODIURIL) 12.5 MG tablet, Take 2 tablets (25 mg total) by mouth daily.   rosuvastatin (CRESTOR) 5 MG tablet, Take  1 tablet  Daily for Cholesterol   telmisartan (MICARDIS) 40 MG tablet, TAKE 1 TABLET BY MOUTH EVERY DAY FOR BLOOD PRESSURE  Current Outpatient Medications (Respiratory):    albuterol (VENTOLIN HFA) 108 (90 Base) MCG/ACT inhaler, Inhale 2 puffs into the lungs every 4 (four) hours as needed for wheezing or shortness of breath.   fexofenadine (ALLEGRA) 180 MG tablet, Take 180 mg by mouth daily as needed for allergies or rhinitis. Alternates with zyrtec when Zyrtec seems to not be as effective   levocetirizine (XYZAL) 5 MG tablet, Take 5 mg by mouth at bedtime.   montelukast (SINGULAIR) 10 MG tablet, TAKE 1 TABLET BY MOUTH DAILY FOR ALLERGIES   fluticasone (FLONASE) 50 MCG/ACT nasal spray, Place 2 sprays into both nostrils daily.  Current Outpatient Medications (Analgesics):    aspirin 81 MG tablet, Take 81 mg by mouth daily.   meloxicam (MOBIC) 15 MG tablet, Take   1/2-1 tablet    Daily    with Food    for Pain &  Inflammation & try LIMIT to 5 days /week to Avoid Kidney Damage   Current Outpatient Medications (Other):    baclofen (LIORESAL) 10 MG tablet, TAKE 1/2 TO 1 TABLET 1 OR 2 X /DAY AS NEEDED FOR MUSCLE SPASMS   Biotin 10 MG TABS, Take by mouth.   brexpiprazole (REXULTI) 1 MG TABS tablet, Take by mouth.   buPROPion (WELLBUTRIN XL) 300 MG 24 hr tablet, TAKE 1 TABLET EVERY MORNING FOR MOOD, FOCUS & CONCENTRATION   Cholecalciferol (VITAMIN D) 125 MCG (5000 UT) CAPS, Take 5,000 Units by mouth daily.    DULoxetine (CYMBALTA) 60 MG capsule, TAKE 1 CAPSULE BY MOUTH EVERY DAY   Eszopiclone 3 MG TABS, TAKE 1 IMMEDIATELY BEFORE BEDTIME   fluocinolone (SYNALAR) 0.025 % ointment, APPLY TO AFFECTED AREA TWICE A DAY (Patient taking differently: as needed.)   fluocinonide (LIDEX) 0.05 % external solution, Apply 1 application topically 2 (two) times  daily. (Patient taking differently: Apply 1 application topically as needed.)   gabapentin (NEURONTIN) 300 MG capsule, Take 1 capsule (300 mg total) by mouth 3 (three) times daily.   MELATONIN PO, Take 5 mg by mouth at bedtime.    Multiple Vitamin (MULTIVITAMIN) tablet, Take 1 tablet by mouth daily.   nystatin (MYCOSTATIN/NYSTOP) powder, Apply 1 application topically 2 (two) times daily.   omeprazole (PRILOSEC) 40 MG capsule, TAKE 1 CAPSULE BY MOUTH EVERY DAY   triamcinolone ointment (KENALOG) 0.1 %, Apply 1 application topically 2 (two) times daily. (Patient taking differently: Apply 1 application topically daily as needed (rash).)   vitamin E 600 UNIT capsule, Take 600 Units by mouth daily.  Health Maintenance:   Immunization History  Administered Date(s) Administered   Influenza Inj Mdck Quad With Preservative 10/25/2016   Influenza, Seasonal, Injecte, Preservative Fre 01/01/2015   Influenza,inj,Quad PF,6+ Mos 10/25/2012, 01/20/2020, 11/16/2020   Influenza-Unspecified 06/05/2018, 12/06/2018   PFIZER(Purple Top)SARS-COV-2 Vaccination 05/03/2019, 05/29/2019    PPD Test 04/11/2013   Pneumococcal Polysaccharide-23 03/28/2012   Tdap 12/12/2012   Tetanus: 2014 Pneumovax: 2014 Flu vaccine: 2020 at [place]  Pap: Levar Dec 2019 MGM: 2017- will get  Last Dental Exam: Yearly visit PFTs unable to complete  Patient Care Team: Unk Pinto, MD as PCP - General (Internal Medicine) Lahoma Crocker, MD as Consulting Physician (Obstetrics and Gynecology) Daryll Brod, MD as Consulting Physician (Orthopedic Surgery) Unk Pinto, MD as Referring Physician (Internal Medicine)  Medical History:  Past Medical History:  Diagnosis Date   Anxiety    Arthritis    Depression    no meds   Diaphragmatic hernia with obstruction, without gangrene 11/07/2018   Dyspnea    doe with exertion occ   Family history of breast cancer    Family history of colon cancer    Family history of lung cancer    Family history of pancreatic cancer    Family history of prostate cancer    GERD (gastroesophageal reflux disease)    Hernia, diaphragmatic    History of kidney stones    Hyperlipidemia    Hypertension    no meds   Pneumonia    1 time   Allergies Allergies  Allergen Reactions   Doxycycline Shortness Of Breath and Anxiety   Erythromycin Nausea Only and Other (See Comments)    Stomach cramping (can take Z Pak)   Flexeril [Cyclobenzaprine Hcl] Other (See Comments)    "don't like the way I feel"   Metformin And Related Other (See Comments)    GI Upset    SURGICAL HISTORY She  has a past surgical history that includes Dilation and evacuation; Dilation and curettage of uterus; Video assisted thoracoscopy (vats)/thorocotomy (Right, 03/23/2018); epigastric hernia repair (N/A, 03/23/2018); IR THORACENTESIS ASP PLEURAL SPACE W/IMG GUIDE (04/19/2018); and Dilatation & curettage/hysteroscopy with myosure (N/A, 10/18/2019).   FAMILY HISTORY Her family history includes Breast cancer in her cousin and maternal aunt; Breast cancer (age of onset: 32) in her  mother; Cervical cancer in her paternal aunt; Colon cancer in her maternal aunt, maternal aunt, maternal uncle, and maternal uncle; Colon polyps in her maternal aunt and mother; Congestive Heart Failure in her mother; Diabetes in her brother, maternal aunt, maternal uncle, and mother; Heart disease in her maternal aunt, maternal grandmother, and maternal uncle; Hemochromatosis in her paternal uncle; Hyperlipidemia in her brother, maternal aunt, maternal uncle, and mother; Hypertension in her brother, maternal aunt, maternal uncle, and mother; Kidney disease in her mother; Lung cancer in her  mother; Ovarian cancer in her paternal aunt; Pancreatic cancer in her maternal aunt; Polycystic ovary syndrome in her paternal aunt; Prostate cancer in her cousin, cousin, and maternal uncle; Stroke in her maternal aunt and maternal uncle; Throat cancer in her maternal aunt.   SOCIAL HISTORY She  reports that she has never smoked. She has never used smokeless tobacco. She reports that she does not currently use alcohol. She reports that she does not use drugs.  Review of Systems: Review of Systems  Constitutional:  Negative for chills, fever and malaise/fatigue.  HENT:  Negative for congestion, ear pain and sore throat.   Eyes: Negative.   Respiratory:  Negative for cough, shortness of breath and wheezing.   Cardiovascular:  Negative for chest pain, palpitations and leg swelling.  Gastrointestinal:  Negative for abdominal pain, blood in stool, constipation, diarrhea, heartburn and melena.  Genitourinary:  Negative for dysuria, frequency, hematuria and urgency.  Neurological:  Negative for dizziness, loss of consciousness and headaches.  Psychiatric/Behavioral:  Negative for depression. The patient is not nervous/anxious and does not have insomnia.    Physical Exam: Estimated body mass index is 45.2 kg/m as calculated from the following:   Height as of this encounter: 5\' 1"  (1.549 m).   Weight as of this  encounter: 239 lb 3.2 oz (108.5 kg). BP 118/82    Pulse 79    Temp 97.7 F (36.5 C)    Ht 5\' 1"  (1.549 m)    Wt 239 lb 3.2 oz (108.5 kg)    LMP  (LMP Unknown)    SpO2 98%    BMI 45.20 kg/m   General Appearance: Well nourished well developed, in no apparent distress.  Eyes: PERRLA, EOMs, conjunctiva no swelling or erythema ENT/Mouth: Ear canals normal without obstruction, swelling, erythema, or discharge.  TMs normal bilaterally with no erythema, bulging, retraction, or loss of landmark.  Oropharynx moist and clear with no exudate, erythema, or swelling.  Neck: Supple, thyroid normal. No bruits.  No cervical adenopathy Respiratory: Respiratory effort normal, Breath sounds clear A&P without wheeze, rhonchi, rales.   Cardio: RRR without murmurs, rubs or gallops. Brisk peripheral pulses without edema.  Chest: symmetric, with normal excursions Breasts: Symmetric, without lumps, nipple discharge, retractions.  Abdomen: Soft, nontender, no guarding, rebound, hernias, masses, or organomegaly.  Lymphatics: Non tender without lymphadenopathy.  Musculoskeletal: Full ROM all peripheral extremities,5/5 strength, and normal gait.  Skin: Warm, dry without rashes, lesions, ecchymosis. Neuro: Awake and oriented X 3, Cranial nerves intact, reflexes equal bilaterally. Normal muscle tone, no cerebellar symptoms.  Psych:  normal affect, Insight and Judgment appropriate.   EKG: NSR, no ST changes   Nikeshia Keetch Kathyrn Drown, NP 10:16 AM [facility]

## 2021-03-17 ENCOUNTER — Other Ambulatory Visit: Payer: Self-pay

## 2021-03-17 ENCOUNTER — Ambulatory Visit: Payer: BC Managed Care – PPO | Admitting: Nurse Practitioner

## 2021-03-17 ENCOUNTER — Encounter: Payer: Self-pay | Admitting: Nurse Practitioner

## 2021-03-17 VITALS — BP 118/82 | HR 79 | Temp 97.7°F | Ht 61.0 in | Wt 239.2 lb

## 2021-03-17 DIAGNOSIS — Z0000 Encounter for general adult medical examination without abnormal findings: Secondary | ICD-10-CM

## 2021-03-17 DIAGNOSIS — Z13220 Encounter for screening for lipoid disorders: Secondary | ICD-10-CM

## 2021-03-17 DIAGNOSIS — Z131 Encounter for screening for diabetes mellitus: Secondary | ICD-10-CM

## 2021-03-17 DIAGNOSIS — R7309 Other abnormal glucose: Secondary | ICD-10-CM

## 2021-03-17 DIAGNOSIS — F330 Major depressive disorder, recurrent, mild: Secondary | ICD-10-CM

## 2021-03-17 DIAGNOSIS — Z1231 Encounter for screening mammogram for malignant neoplasm of breast: Secondary | ICD-10-CM

## 2021-03-17 DIAGNOSIS — K219 Gastro-esophageal reflux disease without esophagitis: Secondary | ICD-10-CM

## 2021-03-17 DIAGNOSIS — E559 Vitamin D deficiency, unspecified: Secondary | ICD-10-CM | POA: Diagnosis not present

## 2021-03-17 DIAGNOSIS — I10 Essential (primary) hypertension: Secondary | ICD-10-CM

## 2021-03-17 DIAGNOSIS — Z79899 Other long term (current) drug therapy: Secondary | ICD-10-CM

## 2021-03-17 DIAGNOSIS — Z1211 Encounter for screening for malignant neoplasm of colon: Secondary | ICD-10-CM

## 2021-03-17 DIAGNOSIS — L404 Guttate psoriasis: Secondary | ICD-10-CM

## 2021-03-17 DIAGNOSIS — G4700 Insomnia, unspecified: Secondary | ICD-10-CM

## 2021-03-17 DIAGNOSIS — Z1389 Encounter for screening for other disorder: Secondary | ICD-10-CM

## 2021-03-17 DIAGNOSIS — Z136 Encounter for screening for cardiovascular disorders: Secondary | ICD-10-CM

## 2021-03-17 DIAGNOSIS — E782 Mixed hyperlipidemia: Secondary | ICD-10-CM

## 2021-03-17 DIAGNOSIS — Z0001 Encounter for general adult medical examination with abnormal findings: Secondary | ICD-10-CM

## 2021-03-17 NOTE — Patient Instructions (Signed)
Fair life protein shakes Aim for 30 grams of protein for breakfast , lunch and dinner Try to find app for phone for chair aerobics Limit simple carbs- white bread, white rice, pasta, potatoes, sugar

## 2021-03-18 ENCOUNTER — Encounter: Payer: Self-pay | Admitting: Nurse Practitioner

## 2021-03-18 ENCOUNTER — Other Ambulatory Visit: Payer: Self-pay | Admitting: Nurse Practitioner

## 2021-03-18 DIAGNOSIS — M5441 Lumbago with sciatica, right side: Secondary | ICD-10-CM

## 2021-03-18 LAB — COMPLETE METABOLIC PANEL WITH GFR
AG Ratio: 1.7 (calc) (ref 1.0–2.5)
ALT: 33 U/L — ABNORMAL HIGH (ref 6–29)
AST: 24 U/L (ref 10–35)
Albumin: 4.5 g/dL (ref 3.6–5.1)
Alkaline phosphatase (APISO): 69 U/L (ref 37–153)
BUN/Creatinine Ratio: 9 (calc) (ref 6–22)
BUN: 10 mg/dL (ref 7–25)
CO2: 26 mmol/L (ref 20–32)
Calcium: 9.7 mg/dL (ref 8.6–10.4)
Chloride: 105 mmol/L (ref 98–110)
Creat: 1.06 mg/dL — ABNORMAL HIGH (ref 0.50–1.03)
Globulin: 2.7 g/dL (calc) (ref 1.9–3.7)
Glucose, Bld: 88 mg/dL (ref 65–99)
Potassium: 4.6 mmol/L (ref 3.5–5.3)
Sodium: 141 mmol/L (ref 135–146)
Total Bilirubin: 0.5 mg/dL (ref 0.2–1.2)
Total Protein: 7.2 g/dL (ref 6.1–8.1)
eGFR: 64 mL/min/{1.73_m2} (ref >=60)

## 2021-03-18 LAB — URINALYSIS, ROUTINE W REFLEX MICROSCOPIC
Bilirubin Urine: NEGATIVE
Glucose, UA: NEGATIVE
Hgb urine dipstick: NEGATIVE
Ketones, ur: NEGATIVE
Leukocytes,Ua: NEGATIVE
Nitrite: NEGATIVE
Protein, ur: NEGATIVE
Specific Gravity, Urine: 1.005 (ref 1.001–1.035)
pH: 6 (ref 5.0–8.0)

## 2021-03-18 LAB — LIPID PANEL
Cholesterol: 161 mg/dL (ref <200)
HDL: 69 mg/dL (ref >=50)
LDL Cholesterol (Calc): 73 mg/dL (calc)
Non-HDL Cholesterol (Calc): 92 mg/dL (calc) (ref <130)
Total CHOL/HDL Ratio: 2.3 (calc) (ref <5.0)
Triglycerides: 107 mg/dL (ref <150)

## 2021-03-18 LAB — VITAMIN D 25 HYDROXY (VIT D DEFICIENCY, FRACTURES): Vit D, 25-Hydroxy: 44 ng/mL (ref 30–100)

## 2021-03-18 LAB — CBC WITH DIFFERENTIAL/PLATELET
Absolute Monocytes: 540 cells/uL (ref 200–950)
Basophils Absolute: 30 cells/uL (ref 0–200)
Basophils Relative: 0.5 %
Eosinophils Absolute: 138 cells/uL (ref 15–500)
Eosinophils Relative: 2.3 %
HCT: 39.9 % (ref 35.0–45.0)
Hemoglobin: 13.5 g/dL (ref 11.7–15.5)
Lymphs Abs: 1938 cells/uL (ref 850–3900)
MCH: 32.3 pg (ref 27.0–33.0)
MCHC: 33.8 g/dL (ref 32.0–36.0)
MCV: 95.5 fL (ref 80.0–100.0)
MPV: 9.3 fL (ref 7.5–12.5)
Monocytes Relative: 9 %
Neutro Abs: 3354 cells/uL (ref 1500–7800)
Neutrophils Relative %: 55.9 %
Platelets: 299 10*3/uL (ref 140–400)
RBC: 4.18 10*6/uL (ref 3.80–5.10)
RDW: 12.2 % (ref 11.0–15.0)
Total Lymphocyte: 32.3 %
WBC: 6 10*3/uL (ref 3.8–10.8)

## 2021-03-18 LAB — TSH: TSH: 3.76 mIU/L

## 2021-03-18 LAB — HEMOGLOBIN A1C
Hgb A1c MFr Bld: 5.7 % of total Hgb — ABNORMAL HIGH (ref <5.7)
Mean Plasma Glucose: 117 mg/dL
eAG (mmol/L): 6.5 mmol/L

## 2021-03-18 LAB — MICROALBUMIN / CREATININE URINE RATIO
Creatinine, Urine: 23 mg/dL (ref 20–275)
Microalb, Ur: <0.2 mg/dL

## 2021-03-18 LAB — MAGNESIUM: Magnesium: 2.2 mg/dL (ref 1.5–2.5)

## 2021-03-18 MED ORDER — MELOXICAM 15 MG PO TABS: ORAL_TABLET | ORAL | 3 refills | Status: DC

## 2021-03-22 ENCOUNTER — Encounter: Payer: Self-pay | Admitting: Nurse Practitioner

## 2021-03-22 ENCOUNTER — Other Ambulatory Visit: Payer: Self-pay | Admitting: Nurse Practitioner

## 2021-03-22 DIAGNOSIS — G4700 Insomnia, unspecified: Secondary | ICD-10-CM

## 2021-03-22 MED ORDER — ESZOPICLONE 3 MG PO TABS: ORAL_TABLET | ORAL | 0 refills | Status: DC

## 2021-03-23 DIAGNOSIS — G4733 Obstructive sleep apnea (adult) (pediatric): Secondary | ICD-10-CM | POA: Diagnosis not present

## 2021-03-29 DIAGNOSIS — F4310 Post-traumatic stress disorder, unspecified: Secondary | ICD-10-CM | POA: Diagnosis not present

## 2021-03-29 DIAGNOSIS — F419 Anxiety disorder, unspecified: Secondary | ICD-10-CM | POA: Diagnosis not present

## 2021-03-29 DIAGNOSIS — F331 Major depressive disorder, recurrent, moderate: Secondary | ICD-10-CM | POA: Diagnosis not present

## 2021-04-20 DIAGNOSIS — G4733 Obstructive sleep apnea (adult) (pediatric): Secondary | ICD-10-CM | POA: Diagnosis not present

## 2021-04-21 DIAGNOSIS — G4733 Obstructive sleep apnea (adult) (pediatric): Secondary | ICD-10-CM | POA: Diagnosis not present

## 2021-04-23 DIAGNOSIS — F331 Major depressive disorder, recurrent, moderate: Secondary | ICD-10-CM | POA: Diagnosis not present

## 2021-04-30 ENCOUNTER — Other Ambulatory Visit: Payer: Self-pay | Admitting: Adult Health

## 2021-05-10 ENCOUNTER — Other Ambulatory Visit: Payer: Self-pay | Admitting: Nurse Practitioner

## 2021-05-10 DIAGNOSIS — M542 Cervicalgia: Secondary | ICD-10-CM

## 2021-05-13 DIAGNOSIS — F419 Anxiety disorder, unspecified: Secondary | ICD-10-CM | POA: Diagnosis not present

## 2021-05-13 DIAGNOSIS — Z79899 Other long term (current) drug therapy: Secondary | ICD-10-CM | POA: Diagnosis not present

## 2021-05-13 DIAGNOSIS — F4310 Post-traumatic stress disorder, unspecified: Secondary | ICD-10-CM | POA: Diagnosis not present

## 2021-05-13 DIAGNOSIS — F331 Major depressive disorder, recurrent, moderate: Secondary | ICD-10-CM | POA: Diagnosis not present

## 2021-05-15 ENCOUNTER — Other Ambulatory Visit: Payer: Self-pay | Admitting: Adult Health

## 2021-05-15 DIAGNOSIS — M5441 Lumbago with sciatica, right side: Secondary | ICD-10-CM

## 2021-05-18 ENCOUNTER — Encounter: Payer: Self-pay | Admitting: Obstetrics & Gynecology

## 2021-05-18 ENCOUNTER — Ambulatory Visit (INDEPENDENT_AMBULATORY_CARE_PROVIDER_SITE_OTHER): Payer: BC Managed Care – PPO | Admitting: Obstetrics & Gynecology

## 2021-05-18 VITALS — BP 110/68 | HR 70 | Resp 16 | Ht 60.25 in | Wt 232.0 lb

## 2021-05-18 DIAGNOSIS — Z6841 Body Mass Index (BMI) 40.0 and over, adult: Secondary | ICD-10-CM | POA: Diagnosis not present

## 2021-05-18 DIAGNOSIS — Z3041 Encounter for surveillance of contraceptive pills: Secondary | ICD-10-CM

## 2021-05-18 DIAGNOSIS — Z01419 Encounter for gynecological examination (general) (routine) without abnormal findings: Secondary | ICD-10-CM | POA: Diagnosis not present

## 2021-05-18 MED ORDER — NORETHINDRONE 0.35 MG PO TABS: 1.0000 | ORAL_TABLET | Freq: Every day | ORAL | 4 refills | Status: DC

## 2021-05-18 NOTE — Progress Notes (Signed)
? ? ?LAUNI ASENCIO May 21, 1970 [id number] ? ? ?History:    51 y.o. G1P0A1L0 Married ?  ?RP:  Established patient presenting for annual gyn exam  ?  ?HPI:  Well on the Progestin pill with rare light menses. Occasional hot flushes.  No pelvic pain.  No pain with IC. Pap Neg 01/2020.  No h/o abnormal Pap. Will repeat Pap at 2 yrs. Urine and bowel movements normal. Breasts normal. Mammo Left Neg, Rt Dx/US 06/2019 benign. Needs to schedule Mammo.  Mother with Breast Cancer.  Genetic screening negative. Liver herniated in chest cavity, surgery successful in 2020.  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  Body mass index 44.93 Planning to increase fitness activities.  Has a referral organized with a Nutritionist in 3 weeks. Health labs with family physician.  [place] this year. ? ?Past medical history,surgical history, family history and social history were all reviewed and documented in the EPIC chart. ? ?Gynecologic History ?No LMP recorded. (Menstrual status: Oral contraceptives). ? ?Obstetric History ?OB History  ?Gravida Para Term Preterm AB Living  ?1 0     1 0  ?SAB IAB Ectopic Multiple Live Births  ?1   0      ?  ?# Outcome Date GA Lbr Len/2nd Weight Sex Delivery Anes PTL Lv  ?1 SAB           ? ? ? ?ROS: A ROS was performed and pertinent positives and negatives are included in the history. ?GENERAL: No fevers or chills. HEENT: No change in vision, no earache, sore throat or sinus congestion. NECK: No pain or stiffness. CARDIOVASCULAR: No chest pain or pressure. No palpitations. PULMONARY: No shortness of breath, cough or wheeze. GASTROINTESTINAL: No abdominal pain, nausea, vomiting or diarrhea, melena or bright red blood per rectum. GENITOURINARY: No urinary frequency, urgency, hesitancy or dysuria. MUSCULOSKELETAL: No joint or muscle pain, no back pain, no recent trauma. DERMATOLOGIC: No rash, no itching, no lesions. ENDOCRINE: No polyuria, polydipsia, no heat or cold intolerance. No recent change in weight.  HEMATOLOGICAL: No anemia or easy bruising or bleeding. NEUROLOGIC: No headache, seizures, numbness, tingling or weakness. PSYCHIATRIC: No depression, no loss of interest in normal activity or change in sleep pattern.  ?  ? ?Exam: ? ? ?BP 110/68   Pulse 70   Resp 16   Ht 5' 0.25" (1.53 m)   Wt 232 lb (105.2 kg)   BMI 44.93 kg/m?  ? ?Body mass index is 44.93 kg/m?. ? ?General appearance : Well developed well nourished female. No acute distress ?HEENT: Eyes: no retinal hemorrhage or exudates,  Neck supple, trachea midline, no carotid bruits, no thyroidmegaly ?Lungs: Clear to auscultation, no rhonchi or wheezes, or rib retractions  ?Heart: Regular rate and rhythm, no murmurs or gallops ?Breast:Examined in sitting and supine position were symmetrical in appearance, no palpable masses or tenderness,  no skin retraction, no nipple inversion, no nipple discharge, no skin discoloration, no axillary or supraclavicular lymphadenopathy ?Abdomen: no palpable masses or tenderness, no rebound or guarding ?Extremities: no edema or skin discoloration or tenderness ? ?Pelvic: Vulva: Normal ?            Vagina: No gross lesions or discharge ? Cervix: No gross lesions or discharge ? Uterus  AV, normal size, shape and consistency, non-tender and mobile ? Adnexa  Without masses or tenderness ? Anus: Normal ? ? ?Assessment/Plan:  51 y.o. female for annual exam  ? ?1. Well female exam with routine gynecological exam ?Well on the Progestin pill with  rare light menses. Occasional hot flushes.  No pelvic pain.  No pain with IC. Pap Neg 01/2020.  No h/o abnormal Pap. Will repeat Pap at 2 yrs. Urine and bowel movements normal. Breasts normal. Mammo Left Neg, Rt Dx/US 06/2019 benign. Needs to schedule Mammo.  Mother with Breast Cancer.  Genetic screening negative. Liver herniated in chest cavity, surgery successful in 2020.  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  Body mass index 44.93 Planning to increase fitness activities.  Has a referral  organized with a Nutritionist in 3 weeks. Health labs with family physician.  [place] this year. ? ?2. Encounter for surveillance of contraceptive pills ?Well on the Progestin pill with rare light menses. Occasional hot flushes.  No pelvic pain.  No pain with IC. No CI to continue on the Progestin pill.  Prescription sent to pharmacy. ? ?3. Class 3 severe obesity due to excess calories with serious comorbidity and body mass index (BMI) of 40.0 to 44.9 in adult [facility]) ?Body mass index 44.93 Planning to increase fitness activities.  Has a referral organized with a Nutritionist in 3 weeks.  ? ?Other orders ?- cetirizine (ZYRTEC) 10 MG tablet; Take 10 mg by mouth daily. ?- norethindrone (MICRONOR) 0.35 MG tablet; Take 1 tablet (0.35 mg total) by mouth daily.  ? ?Genia Del MD, 2:51 PM 05/18/2021 ? ?  ?

## 2021-05-19 DIAGNOSIS — F331 Major depressive disorder, recurrent, moderate: Secondary | ICD-10-CM | POA: Diagnosis not present

## 2021-05-21 DIAGNOSIS — G4733 Obstructive sleep apnea (adult) (pediatric): Secondary | ICD-10-CM | POA: Diagnosis not present

## 2021-05-22 DIAGNOSIS — G4733 Obstructive sleep apnea (adult) (pediatric): Secondary | ICD-10-CM | POA: Diagnosis not present

## 2021-05-24 ENCOUNTER — Encounter: Payer: Self-pay | Admitting: Nurse Practitioner

## 2021-05-24 ENCOUNTER — Other Ambulatory Visit: Payer: Self-pay | Admitting: Internal Medicine

## 2021-05-30 ENCOUNTER — Encounter: Payer: Self-pay | Admitting: Nurse Practitioner

## 2021-05-30 ENCOUNTER — Encounter: Payer: Self-pay | Admitting: Adult Health

## 2021-06-01 ENCOUNTER — Other Ambulatory Visit: Payer: Self-pay

## 2021-06-01 ENCOUNTER — Encounter: Payer: Self-pay | Admitting: Nurse Practitioner

## 2021-06-01 ENCOUNTER — Ambulatory Visit: Payer: BC Managed Care – PPO | Admitting: Nurse Practitioner

## 2021-06-01 VITALS — BP 120/90 | HR 84 | Temp 97.9°F | Wt 235.2 lb

## 2021-06-01 DIAGNOSIS — J40 Bronchitis, not specified as acute or chronic: Secondary | ICD-10-CM

## 2021-06-01 DIAGNOSIS — I10 Essential (primary) hypertension: Secondary | ICD-10-CM

## 2021-06-01 DIAGNOSIS — Z1152 Encounter for screening for COVID-19: Secondary | ICD-10-CM

## 2021-06-01 LAB — POC COVID19 BINAXNOW: SARS Coronavirus 2 Ag: NEGATIVE

## 2021-06-01 MED ORDER — AZITHROMYCIN 250 MG PO TABS: ORAL_TABLET | ORAL | 1 refills | Status: DC

## 2021-06-01 MED ORDER — DEXAMETHASONE 1 MG PO TABS: ORAL_TABLET | ORAL | 0 refills | Status: DC

## 2021-06-01 MED ORDER — ALBUTEROL SULFATE HFA 108 (90 BASE) MCG/ACT IN AERS: 2.0000 | INHALATION_SPRAY | RESPIRATORY_TRACT | 0 refills | Status: DC | PRN

## 2021-06-01 MED ORDER — PROMETHAZINE-DM 6.25-15 MG/5ML PO SYRP: 5.0000 mL | ORAL_SOLUTION | Freq: Four times a day (QID) | ORAL | 1 refills | Status: DC | PRN

## 2021-06-01 MED ORDER — BENZONATATE 100 MG PO CAPS: 100.0000 mg | ORAL_CAPSULE | Freq: Four times a day (QID) | ORAL | 1 refills | Status: DC | PRN

## 2021-06-01 NOTE — Progress Notes (Signed)
Assessment and Plan: ? ?Samantha SpragueBeverly was seen today for acute visit. ? ?Diagnoses and all orders for this visit: ? ?Encounter for screening for COVID-19 ?-     POC COVID-19 ?Negative ? ?Essential hypertension ?- continue medications, DASH diet, exercise and monitor at home. Call if greater than 130/80.   ? ?Bronchitis ?Push fluids, continue Mucinex.  Tessalon perles during the day and Promethazine DM at night for control of cough.  Use Albuterol inhaler regularly for the next several days.  ?If develop fever or symptoms worsen notify the office ?-     dexamethasone (DECADRON) 1 MG tablet; Take 3 tabs for 3 days, 2 tabs for 3 days 1 tab for 5 days. Take with food. ?-     azithromycin (ZITHROMAX) 250 MG tablet; Take 2 tablets (500 mg) on  Day 1,  followed by 1 tablet (250 mg) once daily on Days 2 through 5. ?-     promethazine-dextromethorphan (PROMETHAZINE-DM) 6.25-15 MG/5ML syrup; Take 5 mLs by mouth 4 (four) times daily as needed for cough. ?-     benzonatate (TESSALON PERLES) 100 MG capsule; Take 1 capsule (100 mg total) by mouth every 6 (six) hours as needed for cough. ?-     albuterol (VENTOLIN HFA) 108 (90 Base) MCG/ACT inhaler; Inhale 2 puffs into the lungs every 4 (four) hours as needed for wheezing or shortness of breath. ? ?  ? ? ? ?Further disposition pending results of labs. Discussed med's effects and SE's.   ?Over 30 minutes of exam, counseling, chart review, and critical decision making was performed.  ? ?Future Appointments  ?Date Time Provider Department Center  ?06/22/2021  2:30 PM Clair Greene, Loma Sousaana W, NP [facility] None  ?06/23/2021 11:15 AM Samantha Greene, Angela D, RD [facility]  ?08/24/2021  1:15 PM Ihor AustinMcCue, Jessica, NP [facility] None  ?03/17/2022 10:00 AM Gustin Zobrist, Loma Sousaana W, NP [facility] None  ?05/20/2022  1:30 PM Genia DelLavoie, Marie-Lyne, MD [facility] None  ? ? ?------------------------------------------------------------------------------------------------------------------ ? ? ?HPI ?BP 120/90   Pulse 84   Temp 97.9 ?F (36.6  ?C)   Wt 235 lb 3.2 oz (106.7 kg)   SpO2 95%   BMI 45.55 kg/m?  ? ?51 y.o.female presents for sinus congestion and productive cough of yellow/brown mucus. This has been present for 4 days. Denies nausea/vomiting, fever, body aches, sore throat. She has previously used Albuterol inhaler but is out.  She has been using Mucinex with minimal relief.  ? ?She is currently on Telmisartan 40 mg, HCTZ 25 mg QD and BP's have been controlled ?BP Readings from Last 3 Encounters:  ?06/01/21 120/90  ?05/18/21 110/68  ?03/17/21 118/82  ? ? ? ?Past Medical History:  ?Diagnosis Date  ? Anxiety   ? Arthritis   ? Bipolar disorder, unspecified (HCC)   ? Depression   ? no meds  ? Diaphragmatic hernia with obstruction, without gangrene 11/07/2018  ? Dyspnea   ? doe with exertion occ  ? Family history of breast cancer   ? Family history of colon cancer   ? Family history of lung cancer   ? Family history of pancreatic cancer   ? Family history of prostate cancer   ? GERD (gastroesophageal reflux disease)   ? Hernia, diaphragmatic   ? History of kidney stones   ? Hyperlipidemia   ? Hypertension   ? no meds  ? Pneumonia   ? 1 time  ?  ? ?Allergies  ?Allergen Reactions  ? Doxycycline Shortness Of Breath and Anxiety  ? Erythromycin Nausea  Only and Other (See Comments)  ?  Stomach cramping (can take Z Pak)  ? Flexeril [Cyclobenzaprine Hcl] Other (See Comments)  ?  "don't like the way I feel"  ? Metformin And Related Other (See Comments)  ?  GI Upset  ? ? ?Current Outpatient Medications on File Prior to Visit  ?Medication Sig  ? albuterol (VENTOLIN HFA) 108 (90 Base) MCG/ACT inhaler Inhale 2 puffs into the lungs every 4 (four) hours as needed for wheezing or shortness of breath.  ? aspirin 81 MG tablet Take 81 mg by mouth daily.  ? baclofen (LIORESAL) 10 MG tablet Take  1/2 to 1 tablet 1 or 2 x /day as Needed for Muscle Spasms                             /                              TAKE           BY                    MOUTH  ? Biotin 10  MG TABS Take by mouth.  ? brexpiprazole (REXULTI) 1 MG TABS tablet Take by mouth.  ? buPROPion (WELLBUTRIN XL) 300 MG 24 hr tablet TAKE 1 TABLET EVERY MORNING FOR MOOD, FOCUS & CONCENTRATION  ? cetirizine (ZYRTEC) 10 MG tablet Take 10 mg by mouth daily.  ? Cholecalciferol (VITAMIN D) 125 MCG (5000 UT) CAPS Take 10,000 Units by mouth daily.  ? DULoxetine (CYMBALTA) 60 MG capsule TAKE 1 CAPSULE BY MOUTH EVERY DAY  ? Eszopiclone 3 MG TABS TAKE 1 IMMEDIATELY BEFORE BEDTIME  ? fexofenadine (ALLEGRA) 180 MG tablet Take 180 mg by mouth daily as needed for allergies or rhinitis. Alternates with zyrtec when Zyrtec seems to not be as effective  ? fluocinolone (SYNALAR) 0.025 % ointment APPLY TO AFFECTED AREA TWICE A DAY (Patient taking differently: as needed.)  ? fluocinonide (LIDEX) 0.05 % external solution Apply 1 application topically 2 (two) times daily. (Patient taking differently: Apply 1 application. topically as needed.)  ? gabapentin (NEURONTIN) 300 MG capsule TAKE 1 CAPSULE BY MOUTH THREE TIMES A DAY  ? hydrochlorothiazide (HYDRODIURIL) 12.5 MG tablet Take 2 tablets (25 mg total) by mouth daily.  ? levocetirizine (XYZAL) 5 MG tablet Take 5 mg by mouth at bedtime.  ? MELATONIN PO Take 5 mg by mouth at bedtime.   ? meloxicam (MOBIC) 15 MG tablet Take   1/2-1 tablet    Daily    with Food    for Pain & Inflammation & try LIMIT to 5 days /week to Avoid Kidney Damage  ? montelukast (SINGULAIR) 10 MG tablet TAKE 1 TABLET BY MOUTH DAILY FOR ALLERGIES  ? Multiple Vitamin (MULTIVITAMIN) tablet Take 1 tablet by mouth daily.  ? norethindrone (MICRONOR) 0.35 MG tablet Take 1 tablet (0.35 mg total) by mouth daily.  ? nystatin (MYCOSTATIN/NYSTOP) powder Apply 1 application topically 2 (two) times daily.  ? omeprazole (PRILOSEC) 40 MG capsule TAKE 1 CAPSULE BY MOUTH EVERY DAY  ? rosuvastatin (CRESTOR) 5 MG tablet Take  1 tablet  Daily for Cholesterol  ? telmisartan (MICARDIS) 40 MG tablet TAKE 1 TABLET BY MOUTH EVERY DAY FOR BLOOD  PRESSURE  ? triamcinolone ointment (KENALOG) 0.1 % Apply 1 application topically 2 (two) times daily. (Patient taking differently: Apply 1  application. topically daily as needed (rash).)  ? vitamin E 600 UNIT capsule Take 600 Units by mouth daily.  ? ?No current facility-administered medications on file prior to visit.  ? ? ?ROS: all negative except above.  ? ?Physical Exam: ? ?BP 120/90   Pulse 84   Temp 97.9 ?F (36.6 ?C)   Wt 235 lb 3.2 oz (106.7 kg)   SpO2 95%   BMI 45.55 kg/m?  ? ?General Appearance: Well nourished, in no apparent distress. ?Eyes: PERRLA, EOMs, conjunctiva no swelling or erythema ?Sinuses: No Frontal/maxillary tenderness ?ENT/Mouth: Ext aud canals clear, TMs without erythema, bulging. No erythema, swelling, or exudate on post pharynx.  Tonsils not swollen or erythematous. Hearing normal.  ?Neck: Supple, thyroid normal.  ?Respiratory: Respiratory effort normal, BS diminished in lower left lobe with some crackles noted ?Cardio: RRR with no MRGs. Brisk peripheral pulses without edema.  ?Abdomen: Soft, + BS.  Non tender, no guarding, rebound, hernias, masses. ?Lymphatics: Non tender without lymphadenopathy.  ?Musculoskeletal: Full ROM, 5/5 strength, normal gait.  ?Skin: Warm, dry without rashes, lesions, ecchymosis.  ?Neuro: Cranial nerves intact. Normal muscle tone, no cerebellar symptoms. Sensation intact.  ?Psych: Awake and oriented X 3, normal affect, Insight and Judgment appropriate.  ?  ? ?Revonda Humphrey, NP ?11:31 AM ?[facility] ? ?

## 2021-06-01 NOTE — Patient Instructions (Signed)
Acute Bronchitis, Adult ? ?Acute bronchitis is sudden inflammation of the main airways (bronchi) that come off the windpipe (trachea) in the lungs. The swelling causes the airways to get smaller and make more mucus than normal. This can make it hard to breathe and can cause coughing or noisy breathing (wheezing). ?Acute bronchitis may last several weeks. The cough may last longer. Allergies, asthma, and exposure to smoke may make the condition worse. ?What are the causes? ?This condition can be caused by germs and by substances that irritate the lungs, including: ?Cold and flu viruses. The most common cause of this condition is the virus that causes the common cold. ?Bacteria. This is less common. ?Breathing in substances that irritate the lungs, including: ?Smoke from cigarettes and other forms of tobacco. ?Dust and pollen. ?Fumes from household cleaning products, gases, or burned fuel. ?Indoor or outdoor air pollution. ?What increases the risk? ?The following factors may make you more likely to develop this condition: ?A weak body's defense system, also called the immune system. ?A condition that affects your lungs and breathing, such as asthma. ?What are the signs or symptoms? ?Common symptoms of this condition include: ?Coughing. This may bring up clear, yellow, or green mucus from your lungs (sputum). ?Wheezing. ?Runny or stuffy nose. ?Having too much mucus in your lungs (chest congestion). ?Shortness of breath. ?Aches and pains, including sore throat or chest. ?How is this diagnosed? ?This condition is usually diagnosed based on: ?Your symptoms and medical history. ?A physical exam. ?You may also have other tests, including tests to rule out other conditions, such as pneumonia. These tests include: ?A test of lung function. ?Test of a mucus sample to look for the presence of bacteria. ?Tests to check the oxygen level in your blood. ?Blood tests. ?Chest X-ray. ?How is this treated? ?Most cases of acute  bronchitis clear up over time without treatment. Your health care provider may recommend: ?Drinking more fluids to help thin your mucus so it is easier to cough up. ?Taking inhaled medicine (inhaler) to improve air flow in and out of your lungs. ?Using a vaporizer or a humidifier. These are machines that add water to the air to help you breathe better. ?Taking a medicine that thins mucus and clears congestion (expectorant). ?Taking a medicine that prevents or stops coughing (cough suppressant). ?It is notcommon to take an antibiotic medicine for this condition. ?Follow these instructions at home: ? ?Take over-the-counter and prescription medicines only as told by your health care provider. ?Use an inhaler, vaporizer, or humidifier as told by your health care provider. ?Take two teaspoons (10 mL) of honey at bedtime to lessen coughing at night. ?Drink enough fluid to keep your urine pale yellow. ?Do not use any products that contain nicotine or tobacco. These products include cigarettes, chewing tobacco, and vaping devices, such as e-cigarettes. If you need help quitting, ask your health care provider. ?Get plenty of rest. ?Return to your normal activities as told by your health care provider. Ask your health care provider what activities are safe for you. ?Keep all follow-up visits. This is important. ?How is this prevented? ?To lower your risk of getting this condition again: ?Wash your hands often with soap and water for at least 20 seconds. If soap and water are not available, use hand sanitizer. ?Avoid contact with people who have cold symptoms. ?Try not to touch your mouth, nose, or eyes with your hands. ?Avoid breathing in smoke or chemical fumes. Breathing smoke or chemical fumes will make your   condition worse. ?Get the flu shot every year. ?Contact a health care provider if: ?Your symptoms do not improve after 2 weeks. ?You have trouble coughing up the mucus. ?Your cough keeps you awake at night. ?You have a  fever. ?Get help right away if you: ?Cough up blood. ?Feel pain in your chest. ?Have severe shortness of breath. ?Faint or keep feeling like you are going to faint. ?Have a severe headache. ?Have a fever or chills that get worse. ?These symptoms may represent a serious problem that is an emergency. Do not wait to see if the symptoms will go away. Get medical help right away. Call your local emergency services (911 in the [place] Do not drive yourself to the hospital. ?Summary ?Acute bronchitis is inflammation of the main airways (bronchi) that come off the windpipe (trachea) in the lungs. The swelling causes the airways to get smaller and make more mucus than normal. ?Drinking more fluids can help thin your mucus so it is easier to cough up. ?Take over-the-counter and prescription medicines only as told by your health care provider. ?Do not use any products that contain nicotine or tobacco. These products include cigarettes, chewing tobacco, and vaping devices, such as e-cigarettes. If you need help quitting, ask your health care provider. ?Contact a health care provider if your symptoms do not improve after 2 weeks. ?This information is not intended to replace advice given to you by your health care provider. Make sure you discuss any questions you have with your health care provider. ?Document Revised: 05/20/2020 Document Reviewed: 05/20/2020 ?[employer] Patient Education ? 2023 [employer] ? ?

## 2021-06-09 ENCOUNTER — Other Ambulatory Visit: Payer: Self-pay | Admitting: Adult Health Nurse Practitioner

## 2021-06-09 DIAGNOSIS — K219 Gastro-esophageal reflux disease without esophagitis: Secondary | ICD-10-CM

## 2021-06-20 DIAGNOSIS — G4733 Obstructive sleep apnea (adult) (pediatric): Secondary | ICD-10-CM | POA: Diagnosis not present

## 2021-06-21 DIAGNOSIS — G4733 Obstructive sleep apnea (adult) (pediatric): Secondary | ICD-10-CM | POA: Diagnosis not present

## 2021-06-21 NOTE — Progress Notes (Unsigned)
Assessment and Plan:   Samantha Greene was seen today for follow-up.  Diagnoses and all orders for this visit:  Mild episode of recurrent major depressive disorder (HCC)  Continue to follow with [name] PA-C [facility]  Continue current medications  Monitor symptoms  If any thoughts of hurting self or others she is to go to the ER immediately.   Essential hypertension -     CBC with Differential/Platelet -  continue medications, DASH diet, exercise and monitor at home. Call if greater than 130/80.   - Go to the ER if any chest pain, shortness of breath, nausea, dizziness, severe HA, changes vision/speech   Mixed hyperlipidemia -     COMPLETE METABOLIC PANEL WITH GFR -     Lipid panel - Continue medication, diet and exercise  Gastroesophageal reflux disease without esophagitis -     Magnesium - Continue Prilosec and behavior modifications  Morbid obesity (HCC). - Increase exercise, start focusing on limiting processed carbs and saturated fats  Vitamin D deficiency -     VITAMIN D 25 Hydroxy (Vit-D Deficiency, Fractures)  Medication management  Continued  Abnormal glucose - Continue to work on diet and exercise  Insomnia Continue practicing good sleep hygiene Continue Eszopiclone, avoid nightly use  Scheduling mammogram and colonoscopy.   Further disposition pending results if labs check today. Discussed med's effects and SE's.   Over 30 minutes of face to face interview, exam, counseling, chart review, and critical decision making was performed.   Discussed med's effects and SE's. Screening labs and tests as requested with regular follow-up as recommended. Future Appointments  Date Time Provider Department Center  06/23/2021 11:15 AM Carolan Shiver, RD [facility]  08/24/2021  1:15 PM [name], NP [facility] None  03/17/2022 10:00 AM Revonda Humphrey, NP [facility] None  05/20/2022  1:30 PM Genia Del, MD [facility] None    HPI   51 y.o. female  presents for 6 month follow up of HTN, Hyperlipidemia, Obesity, Depression, Abnormal glucose, GERD  She just got back from a 3 week vacation. 2 weeks at a campground then [facility] a week.   She has OSA on CPAP.  She is wearing it regularly.   She is currently on Wellbutrin 300mg  QD and Cymbalta 60mg  QD. Also using Rexulti 1 mg. Mood is stable.   Her heartburn is controlled unless dietary choices are poor.   BMI is Body mass index is 47.03 kg/m., she is working on diet and exercise. She is not currently exercising and has not been focusing on her diet. She just got back from a 3 week vacation. She goes to the nutritionist tomorrow.  Wt Readings from Last 3 Encounters:  06/22/21 242 lb 12.8 oz (110.1 kg)  06/01/21 235 lb 3.2 oz (106.7 kg)  05/18/21 232 lb (105.2 kg)   Her blood pressure has been controlled at home, today their BP is BP: 122/82.   BP Readings from Last 3 Encounters:  06/22/21 122/82  06/01/21 120/90  05/18/21 110/68    She does workout. She denies chest pain, shortness of breath, dizziness.   She is on cholesterol medication and denies myalgias. Her cholesterol is at goal. The cholesterol last visit was:  Lab Results  Component Value Date   CHOL 161 03/17/2021   HDL 69 03/17/2021   LDLCALC 73 03/17/2021   TRIG 107 03/17/2021   CHOLHDL 2.3 03/17/2021  . She has been working on diet and exercise for prediabetes, she  is on bASA, she is on ACE/ARB and denies foot ulcerations, hyperglycemia, hypoglycemia , increased appetite, nausea, paresthesia of the feet, polydipsia, polyuria, visual disturbances, vomiting and weight loss. Last A1C in the office was:  Lab Results  Component Value Date   HGBA1C 5.7 (H) 03/17/2021   Patient is on Vitamin D supplement, she is on 5000 daily.   Lab Results  Component Value Date   VD25OH 42 03/17/2021     She has been recently diagnosed with bipolar, she does shop when in manic episode. She was still having  spells of being down, depressed. She is shopping daily- feels it is almost a compulsion. [name] PA-C with [facility]    Current Medications:   Current Outpatient Medications (Endocrine & Metabolic):    norethindrone (MICRONOR) 0.35 MG tablet, Take 1 tablet (0.35 mg total) by mouth daily.  Current Outpatient Medications (Cardiovascular):    hydrochlorothiazide (HYDRODIURIL) 12.5 MG tablet, Take 2 tablets (25 mg total) by mouth daily.   rosuvastatin (CRESTOR) 5 MG tablet, Take  1 tablet  Daily for Cholesterol   telmisartan (MICARDIS) 40 MG tablet, TAKE 1 TABLET BY MOUTH EVERY DAY FOR BLOOD PRESSURE  Current Outpatient Medications (Respiratory):    albuterol (VENTOLIN HFA) 108 (90 Base) MCG/ACT inhaler, Inhale 2 puffs into the lungs every 4 (four) hours as needed for wheezing or shortness of breath.   cetirizine (ZYRTEC) 10 MG tablet, Take 10 mg by mouth daily.   fexofenadine (ALLEGRA) 180 MG tablet, Take 180 mg by mouth daily as needed for allergies or rhinitis. Alternates with zyrtec when Zyrtec seems to not be as effective   levocetirizine (XYZAL) 5 MG tablet, Take 5 mg by mouth at bedtime.   montelukast (SINGULAIR) 10 MG tablet, TAKE 1 TABLET BY MOUTH DAILY FOR ALLERGIES  Current Outpatient Medications (Analgesics):    aspirin 81 MG tablet, Take 81 mg by mouth daily.   meloxicam (MOBIC) 15 MG tablet, Take   1/2-1 tablet    Daily    with Food    for Pain & Inflammation & try LIMIT to 5 days /week to Avoid Kidney Damage   Current Outpatient Medications (Other):    baclofen (LIORESAL) 10 MG tablet, Take  1/2 to 1 tablet 1 or 2 x /day as Needed for Muscle Spasms                             /                              TAKE           BY                    MOUTH   Biotin 10 MG TABS, Take by mouth.   brexpiprazole (REXULTI) 1 MG TABS tablet, Take by mouth.   buPROPion (WELLBUTRIN XL) 300 MG 24 hr tablet, TAKE 1 TABLET EVERY MORNING FOR MOOD, FOCUS & CONCENTRATION   Cholecalciferol  (VITAMIN D) 125 MCG (5000 UT) CAPS, Take 10,000 Units by mouth daily.   DULoxetine (CYMBALTA) 60 MG capsule, TAKE 1 CAPSULE BY MOUTH EVERY DAY   Eszopiclone 3 MG TABS, TAKE 1 IMMEDIATELY BEFORE BEDTIME   fluocinolone (SYNALAR) 0.025 % ointment, APPLY TO AFFECTED AREA TWICE A DAY (Patient taking differently: as needed.)   fluocinonide (LIDEX) 0.05 % external solution, Apply 1 application topically 2 (two)  times daily. (Patient taking differently: Apply 1 application. topically as needed.)   gabapentin (NEURONTIN) 300 MG capsule, TAKE 1 CAPSULE BY MOUTH THREE TIMES A DAY   MELATONIN PO, Take 5 mg by mouth at bedtime.    Multiple Vitamin (MULTIVITAMIN) tablet, Take 1 tablet by mouth daily.   nystatin (MYCOSTATIN/NYSTOP) powder, Apply 1 application topically 2 (two) times daily.   omeprazole (PRILOSEC) 40 MG capsule, TAKE 1 CAPSULE BY MOUTH EVERY DAY   triamcinolone ointment (KENALOG) 0.1 %, Apply 1 application topically 2 (two) times daily. (Patient taking differently: Apply 1 application. topically daily as needed (rash).)   vitamin E 600 UNIT capsule, Take 600 Units by mouth daily.  Health Maintenance:   Immunization History  Administered Date(s) Administered   Influenza Inj Mdck Quad With Preservative 10/25/2016   Influenza, Seasonal, Injecte, Preservative Fre 01/01/2015   Influenza,inj,Quad PF,6+ Mos 10/25/2012, 01/20/2020, 11/16/2020   Influenza-Unspecified 06/05/2018, 12/06/2018   PFIZER(Purple Top)SARS-COV-2 Vaccination 05/03/2019, 05/29/2019   PPD Test 04/11/2013   Pneumococcal Polysaccharide-23 03/28/2012   Tdap 12/12/2012   Tetanus: 2014 Pneumovax: 2014 Flu vaccine: 2020 at [facility]  Pap: Lavoie, Dec 2019 MGM: 06/2019 Last Dental Exam: Yearly visit Colonoscopy: Referral placed today   Patient Care Team: Lucky Cowboy, MD as PCP - General (Internal Medicine) Antionette Char, MD as Consulting Physician (Obstetrics and Gynecology) Cindee Salt, MD as Consulting Physician  (Orthopedic Surgery) Lucky Cowboy, MD as Referring Physician (Internal Medicine)  Medical History:  Past Medical History:  Diagnosis Date   Anxiety    Arthritis    Bipolar disorder, unspecified (HCC)    Depression    no meds   Diaphragmatic hernia with obstruction, without gangrene 11/07/2018   Dyspnea    doe with exertion occ   Family history of breast cancer    Family history of colon cancer    Family history of lung cancer    Family history of pancreatic cancer    Family history of prostate cancer    GERD (gastroesophageal reflux disease)    Hernia, diaphragmatic    History of kidney stones    Hyperlipidemia    Hypertension    no meds   Pneumonia    1 time   Allergies Allergies  Allergen Reactions   Doxycycline Shortness Of Breath and Anxiety   Erythromycin Nausea Only and Other (See Comments)    Stomach cramping (can take Z Pak)   Flexeril [Cyclobenzaprine Hcl] Other (See Comments)    "don't like the way I feel"   Metformin And Related Other (See Comments)    GI Upset    SURGICAL HISTORY She  has a past surgical history that includes Dilation and evacuation; Dilation and curettage of uterus; Video assisted thoracoscopy (vats)/thorocotomy (Right, 03/23/2018); epigastric hernia repair (N/A, 03/23/2018); IR THORACENTESIS ASP PLEURAL SPACE W/IMG GUIDE (04/19/2018); and Dilatation & curettage/hysteroscopy with myosure (N/A, 10/18/2019).   FAMILY HISTORY Her family history includes Atrial fibrillation in her maternal aunt; Breast cancer in her cousin and maternal aunt; Breast cancer (age of onset: 47) in her mother; Cervical cancer in her paternal aunt; Colon cancer in her maternal aunt, maternal aunt, maternal uncle, and maternal uncle; Colon polyps in her maternal aunt and mother; Congestive Heart Failure in her mother; Diabetes in her brother, maternal aunt, maternal uncle, and mother; Heart attack in her maternal aunt; Heart disease in her maternal aunt, maternal  grandmother, and maternal uncle; Hemochromatosis in her paternal uncle; Hyperlipidemia in her brother, maternal aunt, maternal uncle, and mother; Hypertension in her brother,  maternal aunt, maternal uncle, and mother; Kidney disease in her mother; Lung cancer in her mother; Ovarian cancer in her paternal aunt; Polycystic ovary syndrome in her paternal aunt; Prostate cancer in her cousin, cousin, and maternal uncle; Stroke in her maternal aunt, maternal aunt, and maternal uncle.   SOCIAL HISTORY She  reports that she has never smoked. She has never used smokeless tobacco. She reports current alcohol use. She reports that she does not use drugs.  Review of Systems: Review of Systems  Constitutional:  Negative for chills, fever and malaise/fatigue.  HENT:  Negative for congestion, ear pain and sore throat.   Eyes: Negative.   Respiratory:  Negative for cough, shortness of breath and wheezing.   Cardiovascular:  Negative for chest pain, palpitations and leg swelling.  Gastrointestinal:  Positive for heartburn. Negative for abdominal pain, blood in stool, constipation, diarrhea, melena, nausea and vomiting.  Genitourinary:  Negative for dysuria, frequency, hematuria and urgency.  Musculoskeletal:  Negative for back pain, joint pain, myalgias and neck pain.  Neurological:  Negative for dizziness, loss of consciousness and headaches.  Psychiatric/Behavioral:  Positive for depression. The patient is not nervous/anxious and does not have insomnia.        Compulsive need to shop daily   Physical Exam: Estimated body mass index is 47.03 kg/m as calculated from the following:   Height as of 05/18/21: 5' 0.25" (1.53 m).   Weight as of this encounter: 242 lb 12.8 oz (110.1 kg). BP 122/82   Pulse 83   Temp 97.6 F (36.4 C)   Wt 242 lb 12.8 oz (110.1 kg)   SpO2 98%   BMI 47.03 kg/m   General Appearance: Obese pleasant female, in no apparent distress.  Eyes: PERRLA, EOMs, conjunctiva no swelling or  erythema ENT/Mouth: Ear canals normal without obstruction, swelling, erythema, or discharge.  TMs normal bilaterally with no erythema, bulging, retraction, or loss of landmark.  Oropharynx moist and clear with no exudate, erythema, or swelling.  Neck: Supple, thyroid normal. No bruits.  No cervical adenopathy Respiratory: Respiratory effort normal, Breath sounds clear A&P without wheeze, rhonchi, rales.   Cardio: RRR without murmurs, rubs or gallops. Brisk peripheral pulses without edema.  Abdomen: Soft, nontender, no guarding, rebound, hernias, masses, or organomegaly.  Lymphatics: Non tender without lymphadenopathy.  Musculoskeletal: Full ROM all peripheral extremities,5/5 strength, and normal gait.  Skin: Warm, dry without rashes, lesions, ecchymosis. Neuro: Awake and oriented X 3, Cranial nerves intact, reflexes equal bilaterally. Normal muscle tone, no cerebellar symptoms.  Psych:  normal affect, Insight and Judgment appropriate.    Revonda Humphreyana W. Elnoria Livingston ANP-C  [facility]  06/22/2021

## 2021-06-22 ENCOUNTER — Encounter: Payer: Self-pay | Admitting: Nurse Practitioner

## 2021-06-22 ENCOUNTER — Ambulatory Visit: Payer: BC Managed Care – PPO | Admitting: Nurse Practitioner

## 2021-06-22 VITALS — BP 122/82 | HR 83 | Temp 97.6°F | Wt 242.8 lb

## 2021-06-22 DIAGNOSIS — F330 Major depressive disorder, recurrent, mild: Secondary | ICD-10-CM | POA: Diagnosis not present

## 2021-06-22 DIAGNOSIS — G4700 Insomnia, unspecified: Secondary | ICD-10-CM

## 2021-06-22 DIAGNOSIS — E782 Mixed hyperlipidemia: Secondary | ICD-10-CM | POA: Diagnosis not present

## 2021-06-22 DIAGNOSIS — I10 Essential (primary) hypertension: Secondary | ICD-10-CM

## 2021-06-22 DIAGNOSIS — Z79899 Other long term (current) drug therapy: Secondary | ICD-10-CM

## 2021-06-22 DIAGNOSIS — E559 Vitamin D deficiency, unspecified: Secondary | ICD-10-CM

## 2021-06-22 DIAGNOSIS — R7309 Other abnormal glucose: Secondary | ICD-10-CM

## 2021-06-22 DIAGNOSIS — K219 Gastro-esophageal reflux disease without esophagitis: Secondary | ICD-10-CM

## 2021-06-22 MED ORDER — ESZOPICLONE 3 MG PO TABS: ORAL_TABLET | ORAL | 0 refills | Status: DC

## 2021-06-23 ENCOUNTER — Encounter: Payer: Self-pay | Admitting: Registered"

## 2021-06-23 ENCOUNTER — Encounter: Payer: BC Managed Care – PPO | Attending: Nurse Practitioner | Admitting: Registered"

## 2021-06-23 DIAGNOSIS — Z713 Dietary counseling and surveillance: Secondary | ICD-10-CM | POA: Diagnosis not present

## 2021-06-23 DIAGNOSIS — E662 Morbid (severe) obesity with alveolar hypoventilation: Secondary | ICD-10-CM

## 2021-06-23 LAB — COMPLETE METABOLIC PANEL WITH GFR
AG Ratio: 1.7 (calc) (ref 1.0–2.5)
ALT: 39 U/L — ABNORMAL HIGH (ref 6–29)
AST: 28 U/L (ref 10–35)
Albumin: 4.3 g/dL (ref 3.6–5.1)
Alkaline phosphatase (APISO): 65 U/L (ref 37–153)
BUN: 14 mg/dL (ref 7–25)
CO2: 26 mmol/L (ref 20–32)
Calcium: 9.9 mg/dL (ref 8.6–10.4)
Chloride: 104 mmol/L (ref 98–110)
Creat: 0.97 mg/dL (ref 0.50–1.03)
Globulin: 2.6 g/dL (calc) (ref 1.9–3.7)
Glucose, Bld: 73 mg/dL (ref 65–99)
Potassium: 4.6 mmol/L (ref 3.5–5.3)
Sodium: 140 mmol/L (ref 135–146)
Total Bilirubin: 0.4 mg/dL (ref 0.2–1.2)
Total Protein: 6.9 g/dL (ref 6.1–8.1)
eGFR: 71 mL/min/{1.73_m2} (ref >=60)

## 2021-06-23 LAB — CBC WITH DIFFERENTIAL/PLATELET
Absolute Monocytes: 582 cells/uL (ref 200–950)
Basophils Absolute: 18 cells/uL (ref 0–200)
Basophils Relative: 0.3 %
Eosinophils Absolute: 132 cells/uL (ref 15–500)
Eosinophils Relative: 2.2 %
HCT: 38.4 % (ref 35.0–45.0)
Hemoglobin: 13 g/dL (ref 11.7–15.5)
Lymphs Abs: 1860 cells/uL (ref 850–3900)
MCH: 33.1 pg — ABNORMAL HIGH (ref 27.0–33.0)
MCHC: 33.9 g/dL (ref 32.0–36.0)
MCV: 97.7 fL (ref 80.0–100.0)
MPV: 9.5 fL (ref 7.5–12.5)
Monocytes Relative: 9.7 %
Neutro Abs: 3408 cells/uL (ref 1500–7800)
Neutrophils Relative %: 56.8 %
Platelets: 247 10*3/uL (ref 140–400)
RBC: 3.93 10*6/uL (ref 3.80–5.10)
RDW: 12.3 % (ref 11.0–15.0)
Total Lymphocyte: 31 %
WBC: 6 10*3/uL (ref 3.8–10.8)

## 2021-06-23 LAB — LIPID PANEL
Cholesterol: 194 mg/dL (ref <200)
HDL: 71 mg/dL (ref >=50)
LDL Cholesterol (Calc): 92 mg/dL (calc)
Non-HDL Cholesterol (Calc): 123 mg/dL (calc) (ref <130)
Total CHOL/HDL Ratio: 2.7 (calc) (ref <5.0)
Triglycerides: 213 mg/dL — ABNORMAL HIGH (ref <150)

## 2021-06-23 LAB — TSH: TSH: 2.65 mIU/L

## 2021-06-23 NOTE — Progress Notes (Signed)
Medical Nutrition Therapy  Appointment Start time:  [id number]  Appointment End time:  1220  Primary concerns today: would like to lose weight and to be healthy  Referral diagnosis: e66.01 Preferred learning style: no preference indicated Learning readiness: ready  NUTRITION ASSESSMENT  Anthropometrics  Not assessed this visit  Clinical Medical Hx: PCOS, hyperlipidemia (high TG) Medications: 5 mg melatonin, gabapentin, Vit D 10000, Biotin  Labts: A1c 5.7%; TG 213 Notable Signs/Symptoms: shortness of breath, fatigue all the time,   Lifestyle & Dietary Hx Pt reports h/o PCOS and tried metformin to help with fertility. Pt states was unsuccessful. Chart shows that she has intolerance.  Pt reports she takes gabapentin due to neuropathy from scar tissue after having surgery liver/lungs. Pt reports neuropathy is not too bothersome except when very active.  Surgery was about the time COVID hit and pt states she became a recluse. Pt states her friends live out of state and thinks she would benefit from having more social activities.   Pt reports using CPAP with some nights has good sleep, pt states she has been told she is borderline narcolepsy.   Pt states eats dinner late because of husband's work schedule they usually have take out food, they don't cook much at home.   Estimated daily fluid intake: 20-80 oz Supplements: biotin, vit D 10000 u with labs every 6 mo per Pt, multivitamin Sleep: 9 hrs, 2 hr nap during the day. Stress / self-care: 6/10; likes to get hair and toes done, camping [place].  Current average weekly physical activity: slow walk 3 miles, 3x/week has shortness of breath that prevents more brisk walking. Shortness of breath can trigger anxiety  24-Hr Dietary Recall First Meal: 10 am  popcorn, apple Snack:  Second Meal: small chocolate frosty (was hot) Snack: none Third Meal: salad, with fried chicken, pepsi zero Snack: mini bag popcorn Beverages: water, pepsi or  coke zero, occasionally cheer wine  NUTRITION DIAGNOSIS  NB-1.1 Food and nutrition-related knowledge deficit As related to eating balanced meals to prevent carb cravings.  As evidenced by Pt states although she likes vegetables and protein that she will often just each chips and/or fruit .  NUTRITION INTERVENTION  Nutrition education (E-1) on the following topics:  Many aspects of health including social, exercise, diet, sleep Accountability ideas  Handouts Provided Include  MyPlate MyPlate Meal worksheet Am I hungry flow sheet  Learning Style & Readiness for Change Teaching method utilized: Visual & Auditory  Demonstrated degree of understanding via: Teach Back  Barriers to learning/adherence to lifestyle change: may be hard with husband not having same health goals.  Goals Established by Pt Increase your social connections, look into joining a meet up group Be open to finding an accountability buddy, either one of your friends or a new friends who shares some similar health goals Diet: consider including beans in your diet 3x/week;  Plan out some meals that are easy to prepare Keep a quick and easy protein in your pantry freezer to include when wanting tortilla chips When eating sweets or chips, avoid feeling guilt. Give yourself permission to enjoy the food, eat it mindfully.  If eating due to stress, dig a little deeper and find the source of stress. Address this to help reduce the amount you feel the desire for food to fix.   MONITORING & EVALUATION Dietary intake, weekly physical activity, and journal of changes in  2 months.

## 2021-06-23 NOTE — Patient Instructions (Signed)
Plan: Increase your social connections, look into joining a meet up group Be open to finding an accountability buddy, either one of your friends or a new friends who shares some similar health goals Diet: consider including beans in your diet 3x/week;  Plan out some meals that are easy to prepare Keep a quick and easy protein in your pantry freezer to include when wanting tortilla chips When eating sweets or chips, avoid feeling guilt. Give yourself permission to enjoy the food, eat it mindfully.  If eating due to stress, dig a little deeper and find the source of stress. Address this to help reduce the amount you feel the desire for food to fix.

## 2021-06-24 ENCOUNTER — Other Ambulatory Visit: Payer: Self-pay | Admitting: Nurse Practitioner

## 2021-06-24 DIAGNOSIS — J40 Bronchitis, not specified as acute or chronic: Secondary | ICD-10-CM

## 2021-06-25 DIAGNOSIS — F331 Major depressive disorder, recurrent, moderate: Secondary | ICD-10-CM | POA: Diagnosis not present

## 2021-07-02 ENCOUNTER — Other Ambulatory Visit: Payer: Self-pay | Admitting: Internal Medicine

## 2021-07-02 DIAGNOSIS — M5441 Lumbago with sciatica, right side: Secondary | ICD-10-CM

## 2021-07-21 DIAGNOSIS — G4733 Obstructive sleep apnea (adult) (pediatric): Secondary | ICD-10-CM | POA: Diagnosis not present

## 2021-07-22 DIAGNOSIS — G4733 Obstructive sleep apnea (adult) (pediatric): Secondary | ICD-10-CM | POA: Diagnosis not present

## 2021-08-17 DIAGNOSIS — E8881 Metabolic syndrome: Secondary | ICD-10-CM | POA: Diagnosis not present

## 2021-08-17 DIAGNOSIS — R635 Abnormal weight gain: Secondary | ICD-10-CM | POA: Diagnosis not present

## 2021-08-17 DIAGNOSIS — N951 Menopausal and female climacteric states: Secondary | ICD-10-CM | POA: Diagnosis not present

## 2021-08-17 DIAGNOSIS — E559 Vitamin D deficiency, unspecified: Secondary | ICD-10-CM | POA: Diagnosis not present

## 2021-08-17 DIAGNOSIS — E785 Hyperlipidemia, unspecified: Secondary | ICD-10-CM | POA: Diagnosis not present

## 2021-08-19 DIAGNOSIS — F4310 Post-traumatic stress disorder, unspecified: Secondary | ICD-10-CM | POA: Diagnosis not present

## 2021-08-19 DIAGNOSIS — Z1339 Encounter for screening examination for other mental health and behavioral disorders: Secondary | ICD-10-CM | POA: Diagnosis not present

## 2021-08-19 DIAGNOSIS — F331 Major depressive disorder, recurrent, moderate: Secondary | ICD-10-CM | POA: Diagnosis not present

## 2021-08-19 DIAGNOSIS — F419 Anxiety disorder, unspecified: Secondary | ICD-10-CM | POA: Diagnosis not present

## 2021-08-19 DIAGNOSIS — Z1331 Encounter for screening for depression: Secondary | ICD-10-CM | POA: Diagnosis not present

## 2021-08-19 DIAGNOSIS — N951 Menopausal and female climacteric states: Secondary | ICD-10-CM | POA: Diagnosis not present

## 2021-08-19 DIAGNOSIS — Z79899 Other long term (current) drug therapy: Secondary | ICD-10-CM | POA: Diagnosis not present

## 2021-08-20 ENCOUNTER — Other Ambulatory Visit: Payer: Self-pay | Admitting: Internal Medicine

## 2021-08-20 DIAGNOSIS — G4733 Obstructive sleep apnea (adult) (pediatric): Secondary | ICD-10-CM | POA: Diagnosis not present

## 2021-08-24 ENCOUNTER — Telehealth: Payer: Self-pay | Admitting: Adult Health

## 2021-08-24 ENCOUNTER — Ambulatory Visit: Payer: BC Managed Care – PPO | Admitting: Adult Health

## 2021-08-24 DIAGNOSIS — Z6841 Body Mass Index (BMI) 40.0 and over, adult: Secondary | ICD-10-CM | POA: Diagnosis not present

## 2021-08-24 DIAGNOSIS — K760 Fatty (change of) liver, not elsewhere classified: Secondary | ICD-10-CM | POA: Diagnosis not present

## 2021-08-24 DIAGNOSIS — Z0289 Encounter for other administrative examinations: Secondary | ICD-10-CM
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
## 2021-08-24 NOTE — Telephone Encounter (Signed)
Pt called and said she cant make her appointment today due to family emergency.

## 2021-08-24 NOTE — Telephone Encounter (Signed)
Noted  

## 2021-09-07 ENCOUNTER — Telehealth: Payer: Self-pay | Admitting: *Deleted

## 2021-09-07 NOTE — Patient Outreach (Signed)
  Care Coordination   09/07/2021 Name: Samantha Greene MRN: [id number] DOB: 02/16/1970   Care Coordination Outreach Attempts:  An unsuccessful telephone outreach was attempted today to offer the patient information about available care coordination services as a benefit of their health plan.   Follow Up Plan:  Additional outreach attempts will be made to offer the patient care coordination information and services.   Encounter Outcome:  No Answer  Care Coordination Interventions Activated:  No   Care Coordination Interventions:  No, not indicated   Reece Levy MSW, LCSW Licensed Clinical Social Worker      [phone]

## 2021-09-08 ENCOUNTER — Ambulatory Visit: Payer: BC Managed Care – PPO | Admitting: Registered"

## 2021-09-13 DIAGNOSIS — F331 Major depressive disorder, recurrent, moderate: Secondary | ICD-10-CM | POA: Diagnosis not present

## 2021-09-16 ENCOUNTER — Other Ambulatory Visit: Payer: Self-pay | Admitting: Nurse Practitioner

## 2021-09-16 DIAGNOSIS — G4700 Insomnia, unspecified: Secondary | ICD-10-CM

## 2021-09-17 MED ORDER — ESZOPICLONE 3 MG PO TABS: ORAL_TABLET | ORAL | 0 refills | Status: DC

## 2021-09-20 DIAGNOSIS — G4733 Obstructive sleep apnea (adult) (pediatric): Secondary | ICD-10-CM | POA: Diagnosis not present

## 2021-10-12 NOTE — Progress Notes (Unsigned)
Assessment and Plan: Valari was seen today for cyst.  Diagnoses and all orders for this visit:  Cutaneous abscess of chest wall Procedure: Incision & drainage Anesthesia: 1.5 cc lidocaine without epinephrine Type: total The area was prepared and draped in a standard fashion. The lesion drained white, chalky, cyst material. Due to its large size, the cavity was packed with iodoform gauze. The patient tolerated the procedure well. The patient was instructed on post-op care.  Follow up in 3 days to remove gauze and reevaluate If develops , fever, increased drainage or pain or odor notify the office -     cephALEXin (KEFLEX) 500 MG capsule; Take 1 capsule (500 mg total) by mouth 3 (three) times daily for 10 days.  Seasonal allergies Continue alternating Allegra, Zyrtec and Xyzal Continue Singulair -     montelukast (SINGULAIR) 10 MG tablet; TAKE 1 TABLET BY MOUTH DAILY FOR ALLERGIES       Further disposition pending results of labs. Discussed med's effects and SE's.   Over 30 minutes of exam, counseling, chart review, and critical decision making was performed.   Future Appointments  Date Time Provider Department Center  11/10/2021  1:15 PM [name], NP [facility] None  12/08/2021  2:30 PM [name], NP [facility] None  03/17/2022 10:00 AM [name], NP [facility] None  05/20/2022  1:30 PM Genia Del, MD [facility] None    ------------------------------------------------------------------------------------------------------------------   HPI BP (!) 132/90   Pulse 74   Temp (!) 96.9 F (36.1 C)   Ht 5' 0.25" (1.53 m)   Wt 240 lb 3.2 oz (109 kg)   SpO2 95%   BMI 46.52 kg/m    51 y.o.female presents for presents for swollen erythematous area on her chest wall which has been present for approximately 5 days.  The area continues to get larger in size and is very tender.  She has not tried to squeeze or open the area or done any other  treatments.  She continues to have issues with seasonal allergies and is using Montelukast with good benefit.  She does need a refill today. She also uses Allegra, zurtec and Xyzal alternating.   Past Medical History:  Diagnosis Date   Anxiety    Arthritis    Bipolar disorder, unspecified (HCC)    Depression    no meds   Diaphragmatic hernia with obstruction, without gangrene 11/07/2018   Dyspnea    doe with exertion occ   Family history of breast cancer    Family history of colon cancer    Family history of lung cancer    Family history of pancreatic cancer    Family history of prostate cancer    GERD (gastroesophageal reflux disease)    Hernia, diaphragmatic    History of kidney stones    Hyperlipidemia    Hypertension    no meds   Pneumonia    1 time     Allergies  Allergen Reactions   Doxycycline Shortness Of Breath and Anxiety   Erythromycin Nausea Only and Other (See Comments)    Stomach cramping (can take Z Pak)   Flexeril [Cyclobenzaprine Hcl] Other (See Comments)    "don't like the way I feel"   Metformin And Related Other (See Comments)    GI Upset    Current Outpatient Medications on File Prior to Visit  Medication Sig   albuterol (VENTOLIN HFA) 108 (90 Base) MCG/ACT inhaler INHALE 2 PUFFS INTO THE LUNGS EVERY 4 HOURS AS NEEDED FOR  WHEEZING OR SHORTNESS OF BREATH.   aspirin 81 MG tablet Take 81 mg by mouth daily.   baclofen (LIORESAL) 10 MG tablet TAKE 1/2 TO 1 TABLET 1 OR 2 X /DAY AS NEEDED FOR MUSCLE SPASMS / TAKE BY MOUTH   brexpiprazole (REXULTI) 1 MG TABS tablet Take by mouth.   buPROPion (WELLBUTRIN XL) 300 MG 24 hr tablet TAKE 1 TABLET EVERY MORNING FOR MOOD, FOCUS & CONCENTRATION   cetirizine (ZYRTEC) 10 MG tablet Take 10 mg by mouth daily.   Cholecalciferol (VITAMIN D) 125 MCG (5000 UT) CAPS Take 10,000 Units by mouth daily.   DULoxetine (CYMBALTA) 60 MG capsule TAKE 1 CAPSULE BY MOUTH EVERY DAY   Eszopiclone 3 MG TABS TAKE 1 IMMEDIATELY BEFORE  BEDTIME   fexofenadine (ALLEGRA) 180 MG tablet Take 180 mg by mouth daily as needed for allergies or rhinitis. Alternates with zyrtec when Zyrtec seems to not be as effective   gabapentin (NEURONTIN) 300 MG capsule TAKE 1 CAPSULE BY MOUTH THREE TIMES A DAY   hydrochlorothiazide (HYDRODIURIL) 12.5 MG tablet Take 2 tablets (25 mg total) by mouth daily.   meloxicam (MOBIC) 15 MG tablet Take   1/2-1 tablet    Daily    with Food    for Pain & Inflammation & try LIMIT to 5 days /week to Avoid Kidney Damage   montelukast (SINGULAIR) 10 MG tablet TAKE 1 TABLET BY MOUTH DAILY FOR ALLERGIES   Multiple Vitamin (MULTIVITAMIN) tablet Take 1 tablet by mouth daily.   norethindrone (MICRONOR) 0.35 MG tablet Take 1 tablet (0.35 mg total) by mouth daily.   omeprazole (PRILOSEC) 40 MG capsule TAKE 1 CAPSULE BY MOUTH EVERY DAY   rosuvastatin (CRESTOR) 5 MG tablet TAKE 1 TABLET BY MOUTH EVERY DAY FOR CHOLESTEROL   telmisartan (MICARDIS) 40 MG tablet TAKE 1 TABLET BY MOUTH EVERY DAY FOR BLOOD PRESSURE   triamcinolone ointment (KENALOG) 0.1 % Apply 1 application topically 2 (two) times daily. (Patient taking differently: Apply 1 application  topically daily as needed (rash).)   Biotin 10 MG TABS Take by mouth. (Patient not taking: Reported on 10/13/2021)   fluocinolone (SYNALAR) 0.025 % ointment APPLY TO AFFECTED AREA TWICE A DAY (Patient not taking: Reported on 10/13/2021)   fluocinonide (LIDEX) 0.05 % external solution Apply 1 application topically 2 (two) times daily. (Patient not taking: Reported on 10/13/2021)   levocetirizine (XYZAL) 5 MG tablet Take 5 mg by mouth at bedtime.   MELATONIN PO Take 5 mg by mouth at bedtime.  (Patient not taking: Reported on 10/13/2021)   nystatin (MYCOSTATIN/NYSTOP) powder Apply 1 application topically 2 (two) times daily. (Patient not taking: Reported on 10/13/2021)   vitamin E 600 UNIT capsule Take 600 Units by mouth daily. (Patient not taking: Reported on 10/13/2021)   No current  facility-administered medications on file prior to visit.    ROS: all negative except above.   Physical Exam:  BP (!) 132/90   Pulse 74   Temp (!) 96.9 F (36.1 C)   Ht 5' 0.25" (1.53 m)   Wt 240 lb 3.2 oz (109 kg)   SpO2 95%   BMI 46.52 kg/m   General Appearance: Well nourished, in no apparent distress. Eyes: PERRLA, EOMs, conjunctiva no swelling or erythema Sinuses: No Frontal/maxillary tenderness ENT/Mouth: Ext aud canals clear, TMs without erythema, bulging. No erythema, swelling, or exudate on post pharynx.  Tonsils not swollen or erythematous. Hearing normal.  Neck: Supple, thyroid normal.  Respiratory: Respiratory effort normal, BS equal bilaterally without  rales, rhonchi, wheezing or stridor.  Cardio: RRR with no MRGs. Brisk peripheral pulses without edema.  Abdomen: Soft, + BS.  Non tender, no guarding, rebound, hernias, masses. Lymphatics: Non tender without lymphadenopathy.  Musculoskeletal: Full ROM, 5/5 strength, normal gait.  Skin: Warm, dry . 3-4 cm raised , erythematous and tender abscess noted on right chest wall Neuro: Cranial nerves intact. Normal muscle tone, no cerebellar symptoms. Sensation intact.  Psych: Awake and oriented X 3, normal affect, Insight and Judgment appropriate.   Diagnosis: abscess - Location: right chest wall sternal border Procedure: Incision & drainage Anesthesia: 1.5 cc lidocaine without epinephrine Type: total The area was prepared and draped in a standard fashion. The lesion drained white, chalky, cyst material. Due to its large size, the cavity was packed with iodoform gauze. The patient tolerated the procedure well. The patient was instructed on post-op care.    [name], NP 3:40 PM [facility]

## 2021-10-13 ENCOUNTER — Encounter: Payer: Self-pay | Admitting: Nurse Practitioner

## 2021-10-13 ENCOUNTER — Ambulatory Visit: Payer: BC Managed Care – PPO | Admitting: Nurse Practitioner

## 2021-10-13 VITALS — BP 132/90 | HR 74 | Temp 96.9°F | Ht 60.25 in | Wt 240.2 lb

## 2021-10-13 DIAGNOSIS — J302 Other seasonal allergic rhinitis: Secondary | ICD-10-CM | POA: Diagnosis not present

## 2021-10-13 DIAGNOSIS — L02213 Cutaneous abscess of chest wall: Secondary | ICD-10-CM

## 2021-10-13 MED ORDER — CEPHALEXIN 500 MG PO CAPS: 500.0000 mg | ORAL_CAPSULE | Freq: Three times a day (TID) | ORAL | 0 refills | Status: AC

## 2021-10-13 MED ORDER — MONTELUKAST SODIUM 10 MG PO TABS: ORAL_TABLET | ORAL | 3 refills | Status: DC

## 2021-10-13 NOTE — Patient Instructions (Signed)
Keflex 500 mg three times a day x 10 days  Keep area clean and dry.  Leave iodoform gauze in place and will recheck on Friday  Skin Abscess  A skin abscess is an infected area of your skin that contains pus and other material. An abscess can happen in any part of your body. Some abscesses break open (rupture) on their own. Most continue to get worse unless they are treated. The infection can spread deeper into the body and into your blood, which can make you feel sick. A skin abscess is caused by germs that enter the skin through a cut or scrape. It can also be caused by blocked oil and sweat glands or infected hair follicles. This condition is usually treated by: Draining the pus. Taking antibiotic medicines. Placing a warm, wet washcloth over the abscess. Follow these instructions at home: Medicines  Take over-the-counter and prescription medicines only as told by your doctor. If you were prescribed an antibiotic medicine, take it as told by your doctor. Do not stop taking the antibiotic even if you start to feel better. Abscess care  If you have an abscess that has not drained, place a warm, clean, wet washcloth over the abscess several times a day. Do this as told by your doctor. Follow instructions from your doctor about how to take care of your abscess. Make sure you: Cover the abscess with a bandage (dressing). Change your bandage or gauze as told by your doctor. Wash your hands with soap and water before you change the bandage or gauze. If you cannot use soap and water, use hand sanitizer. Check your abscess every day for signs that the infection is getting worse. Check for: More redness, swelling, or pain. More fluid or blood. Warmth. More pus or a bad smell. General instructions To avoid spreading the infection: Do not share personal care items, towels, or hot tubs with others. Avoid making skin-to-skin contact with other people. Keep all follow-up visits as told by your  doctor. This is important. Contact a doctor if: You have more redness, swelling, or pain around your abscess. You have more fluid or blood coming from your abscess. Your abscess feels warm when you touch it. You have more pus or a bad smell coming from your abscess. Your muscles ache. You feel sick. Get help right away if: You have very bad (severe) pain. You see red streaks on your skin spreading away from the abscess. You see redness that spreads quickly. You have a fever or chills. Summary A skin abscess is an infected area of your skin that contains pus and other material. The abscess is caused by germs that enter the skin through a cut or scrape. It can also be caused by blocked oil and sweat glands or infected hair follicles. Follow your doctor's instructions on caring for your abscess, taking medicines, preventing infections, and keeping follow-up visits. This information is not intended to replace advice given to you by your health care provider. Make sure you discuss any questions you have with your health care provider. Document Revised: 04/22/2021 Document Reviewed: 10/26/2020 Elsevier Patient Education  2023 [employer].

## 2021-10-14 NOTE — Progress Notes (Unsigned)
Assessment and Plan:  There are no diagnoses linked to this encounter.    Further disposition pending results of labs. Discussed med's effects and SE's.   Over 30 minutes of exam, counseling, chart review, and critical decision making was performed.   Future Appointments  Date Time Provider Department Center  10/15/2021 11:30 AM [name], NP [facility] None  11/10/2021  1:15 PM [name], NP [facility] None  12/08/2021  2:30 PM [name], NP [facility] None  03/17/2022 10:00 AM [name], NP [facility] None  05/20/2022  1:30 PM Genia Del, MD [facility] None    ------------------------------------------------------------------------------------------------------------------   HPI There were no vitals taken for this visit. 51 y.o.female presents for  Past Medical History:  Diagnosis Date   Anxiety    Arthritis    Bipolar disorder, unspecified (HCC)    Depression    no meds   Diaphragmatic hernia with obstruction, without gangrene 11/07/2018   Dyspnea    doe with exertion occ   Family history of breast cancer    Family history of colon cancer    Family history of lung cancer    Family history of pancreatic cancer    Family history of prostate cancer    GERD (gastroesophageal reflux disease)    Hernia, diaphragmatic    History of kidney stones    Hyperlipidemia    Hypertension    no meds   Pneumonia    1 time     Allergies  Allergen Reactions   Doxycycline Shortness Of Breath and Anxiety   Erythromycin Nausea Only and Other (See Comments)    Stomach cramping (can take Z Pak)   Flexeril [Cyclobenzaprine Hcl] Other (See Comments)    "don't like the way I feel"   Metformin And Related Other (See Comments)    GI Upset    Current Outpatient Medications on File Prior to Visit  Medication Sig   albuterol (VENTOLIN HFA) 108 (90 Base) MCG/ACT inhaler INHALE 2 PUFFS INTO THE LUNGS EVERY 4 HOURS AS NEEDED FOR WHEEZING OR SHORTNESS OF  BREATH.   aspirin 81 MG tablet Take 81 mg by mouth daily.   baclofen (LIORESAL) 10 MG tablet TAKE 1/2 TO 1 TABLET 1 OR 2 X /DAY AS NEEDED FOR MUSCLE SPASMS / TAKE BY MOUTH   brexpiprazole (REXULTI) 1 MG TABS tablet Take by mouth.   buPROPion (WELLBUTRIN XL) 300 MG 24 hr tablet TAKE 1 TABLET EVERY MORNING FOR MOOD, FOCUS & CONCENTRATION   cephALEXin (KEFLEX) 500 MG capsule Take 1 capsule (500 mg total) by mouth 3 (three) times daily for 10 days.   cetirizine (ZYRTEC) 10 MG tablet Take 10 mg by mouth daily.   Cholecalciferol (VITAMIN D) 125 MCG (5000 UT) CAPS Take 10,000 Units by mouth daily.   DULoxetine (CYMBALTA) 60 MG capsule TAKE 1 CAPSULE BY MOUTH EVERY DAY   Eszopiclone 3 MG TABS TAKE 1 IMMEDIATELY BEFORE BEDTIME   fexofenadine (ALLEGRA) 180 MG tablet Take 180 mg by mouth daily as needed for allergies or rhinitis. Alternates with zyrtec when Zyrtec seems to not be as effective   gabapentin (NEURONTIN) 300 MG capsule TAKE 1 CAPSULE BY MOUTH THREE TIMES A DAY   hydrochlorothiazide (HYDRODIURIL) 12.5 MG tablet Take 2 tablets (25 mg total) by mouth daily.   levocetirizine (XYZAL) 5 MG tablet Take 5 mg by mouth at bedtime.   meloxicam (MOBIC) 15 MG tablet Take   1/2-1 tablet    Daily    with Food  for Pain & Inflammation & try LIMIT to 5 days /week to Avoid Kidney Damage   montelukast (SINGULAIR) 10 MG tablet TAKE 1 TABLET BY MOUTH DAILY FOR ALLERGIES   Multiple Vitamin (MULTIVITAMIN) tablet Take 1 tablet by mouth daily.   norethindrone (MICRONOR) 0.35 MG tablet Take 1 tablet (0.35 mg total) by mouth daily.   omeprazole (PRILOSEC) 40 MG capsule TAKE 1 CAPSULE BY MOUTH EVERY DAY   rosuvastatin (CRESTOR) 5 MG tablet TAKE 1 TABLET BY MOUTH EVERY DAY FOR CHOLESTEROL   telmisartan (MICARDIS) 40 MG tablet TAKE 1 TABLET BY MOUTH EVERY DAY FOR BLOOD PRESSURE   triamcinolone ointment (KENALOG) 0.1 % Apply 1 application topically 2 (two) times daily. (Patient taking differently: Apply 1 application   topically daily as needed (rash).)   No current facility-administered medications on file prior to visit.    ROS: all negative except above.   Physical Exam:  There were no vitals taken for this visit.  General Appearance: Well nourished, in no apparent distress. Eyes: PERRLA, EOMs, conjunctiva no swelling or erythema Sinuses: No Frontal/maxillary tenderness ENT/Mouth: Ext aud canals clear, TMs without erythema, bulging. No erythema, swelling, or exudate on post pharynx.  Tonsils not swollen or erythematous. Hearing normal.  Neck: Supple, thyroid normal.  Respiratory: Respiratory effort normal, BS equal bilaterally without rales, rhonchi, wheezing or stridor.  Cardio: RRR with no MRGs. Brisk peripheral pulses without edema.  Abdomen: Soft, + BS.  Non tender, no guarding, rebound, hernias, masses. Lymphatics: Non tender without lymphadenopathy.  Musculoskeletal: Full ROM, 5/5 strength, normal gait.  Skin: Warm, dry without rashes, lesions, ecchymosis.  Neuro: Cranial nerves intact. Normal muscle tone, no cerebellar symptoms. Sensation intact.  Psych: Awake and oriented X 3, normal affect, Insight and Judgment appropriate.     [name], NP 8:41 AM [facility]

## 2021-10-15 ENCOUNTER — Ambulatory Visit: Payer: BC Managed Care – PPO | Admitting: Nurse Practitioner

## 2021-10-15 ENCOUNTER — Encounter: Payer: Self-pay | Admitting: Nurse Practitioner

## 2021-10-15 VITALS — BP 128/80 | HR 82 | Temp 97.8°F | Resp 17 | Ht 60.0 in | Wt 235.6 lb

## 2021-10-15 DIAGNOSIS — L02213 Cutaneous abscess of chest wall: Secondary | ICD-10-CM

## 2021-10-15 DIAGNOSIS — I10 Essential (primary) hypertension: Secondary | ICD-10-CM

## 2021-10-21 DIAGNOSIS — G4733 Obstructive sleep apnea (adult) (pediatric): Secondary | ICD-10-CM | POA: Diagnosis not present

## 2021-11-10 ENCOUNTER — Ambulatory Visit: Payer: BC Managed Care – PPO | Admitting: Adult Health

## 2021-11-11 DIAGNOSIS — G4733 Obstructive sleep apnea (adult) (pediatric): Secondary | ICD-10-CM | POA: Diagnosis not present

## 2021-11-20 DIAGNOSIS — G4733 Obstructive sleep apnea (adult) (pediatric): Secondary | ICD-10-CM | POA: Diagnosis not present

## 2021-11-29 ENCOUNTER — Encounter: Payer: Self-pay | Admitting: Nurse Practitioner

## 2021-11-30 ENCOUNTER — Other Ambulatory Visit: Payer: Self-pay | Admitting: Nurse Practitioner

## 2021-11-30 DIAGNOSIS — M5441 Lumbago with sciatica, right side: Secondary | ICD-10-CM

## 2021-11-30 MED ORDER — BACLOFEN 10 MG PO TABS: ORAL_TABLET | ORAL | 3 refills | Status: DC

## 2021-12-02 ENCOUNTER — Encounter: Payer: Self-pay | Admitting: Adult Health

## 2021-12-02 ENCOUNTER — Ambulatory Visit: Payer: BC Managed Care – PPO | Admitting: Adult Health

## 2021-12-02 VITALS — BP 143/88 | HR 80 | Ht 61.0 in | Wt 240.5 lb

## 2021-12-02 DIAGNOSIS — G4733 Obstructive sleep apnea (adult) (pediatric): Secondary | ICD-10-CM | POA: Diagnosis not present

## 2021-12-02 NOTE — Patient Instructions (Addendum)
Will send order to DME company requesting a mask refitting  Continue nightly use of CPAP ensuring greater than 4 hours nightly for optimal benefit and per insurance requirements   Follow-up in 1 year or call earlier if needed
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
## 2021-12-02 NOTE — Progress Notes (Signed)
Community message for CPAP Orders sent to DME on file [facility]. New orders have been placed for the above pt, DOB:1970/06/19 Thanks

## 2021-12-03 DIAGNOSIS — Z79899 Other long term (current) drug therapy: Secondary | ICD-10-CM | POA: Diagnosis not present

## 2021-12-03 NOTE — Progress Notes (Unsigned)
Assessment and Plan:  There are no diagnoses linked to this encounter.    Further disposition pending results of labs. Discussed med's effects and SE's.   Over 30 minutes of exam, counseling, chart review, and critical decision making was performed.   Future Appointments  Date Time Provider [place]  12/06/2021  3:30 PM [name], NP [facility] None  12/08/2021  2:30 PM [name], NP [facility] None  03/17/2022 10:00 AM [name], NP [facility] None  05/20/2022  1:30 PM Princess Bruins, MD [facility] None  12/08/2022  2:45 PM Frann Rider, NP [facility] None    ------------------------------------------------------------------------------------------------------------------   HPI There were no vitals taken for this visit. 51 y.o.female presents for  Past Medical History:  Diagnosis Date   Anxiety    Arthritis    Bipolar disorder, unspecified ([place])    Depression    no meds   Diaphragmatic hernia with obstruction, without gangrene 11/07/2018   Dyspnea    doe with exertion occ   Family history of breast cancer    Family history of colon cancer    Family history of lung cancer    Family history of pancreatic cancer    Family history of prostate cancer    GERD (gastroesophageal reflux disease)    Hernia, diaphragmatic    History of kidney stones    Hyperlipidemia    Hypertension    no meds   Pneumonia    1 time     Allergies  Allergen Reactions   Doxycycline Shortness Of Breath and Anxiety   Erythromycin Nausea Only and Other (See Comments)    Stomach cramping (can take Z Pak)   Flexeril [Cyclobenzaprine Hcl] Other (See Comments)    "don't like the way I feel"   Metformin And Related Other (See Comments)    GI Upset    Current Outpatient Medications on File Prior to Visit  Medication Sig   albuterol (VENTOLIN HFA) 108 (90 Base) MCG/ACT inhaler INHALE 2 PUFFS INTO THE LUNGS EVERY 4 HOURS AS NEEDED FOR WHEEZING OR SHORTNESS OF BREATH.    aspirin 81 MG tablet Take 81 mg by mouth daily.   baclofen (LIORESAL) 10 MG tablet TAKE 1/2 TO 1 TABLET 1 OR 2 X /DAY AS NEEDED FOR MUSCLE SPASMS / TAKE BY MOUTH   brexpiprazole (REXULTI) 1 MG TABS tablet Take by mouth.   buPROPion (WELLBUTRIN XL) 300 MG 24 hr tablet TAKE 1 TABLET EVERY MORNING FOR MOOD, FOCUS & CONCENTRATION   cetirizine (ZYRTEC) 10 MG tablet Take 10 mg by mouth daily.   Cholecalciferol (VITAMIN D) 125 MCG (5000 UT) CAPS Take 10,000 Units by mouth daily.   DULoxetine (CYMBALTA) 60 MG capsule TAKE 1 CAPSULE BY MOUTH EVERY DAY   Eszopiclone 3 MG TABS TAKE 1 IMMEDIATELY BEFORE BEDTIME   fexofenadine (ALLEGRA) 180 MG tablet Take 180 mg by mouth daily as needed for allergies or rhinitis. Alternates with zyrtec when Zyrtec seems to not be as effective   gabapentin (NEURONTIN) 300 MG capsule TAKE 1 CAPSULE BY MOUTH THREE TIMES A DAY   hydrochlorothiazide (HYDRODIURIL) 12.5 MG tablet Take 2 tablets (25 mg total) by mouth daily.   levocetirizine (XYZAL) 5 MG tablet Take 5 mg by mouth at bedtime.   meloxicam (MOBIC) 15 MG tablet Take   1/2-1 tablet    Daily    with Food    for Pain & Inflammation & try LIMIT to 5 days /week to Avoid Kidney Damage   montelukast (SINGULAIR) 10  MG tablet TAKE 1 TABLET BY MOUTH DAILY FOR ALLERGIES   Multiple Vitamin (MULTIVITAMIN) tablet Take 1 tablet by mouth daily.   norethindrone (MICRONOR) 0.35 MG tablet Take 1 tablet (0.35 mg total) by mouth daily.   omeprazole (PRILOSEC) 40 MG capsule TAKE 1 CAPSULE BY MOUTH EVERY DAY   rosuvastatin (CRESTOR) 5 MG tablet TAKE 1 TABLET BY MOUTH EVERY DAY FOR CHOLESTEROL   telmisartan (MICARDIS) 40 MG tablet TAKE 1 TABLET BY MOUTH EVERY DAY FOR BLOOD PRESSURE   triamcinolone ointment (KENALOG) 0.1 % Apply 1 application topically 2 (two) times daily. (Patient taking differently: Apply 1 application  topically daily as needed (rash).)   No current facility-administered medications on file prior to visit.    ROS: all  negative except above.   Physical Exam:  There were no vitals taken for this visit.  General Appearance: Well nourished, in no apparent distress. Eyes: PERRLA, EOMs, conjunctiva no swelling or erythema Sinuses: No Frontal/maxillary tenderness ENT/Mouth: Ext aud canals clear, TMs without erythema, bulging. No erythema, swelling, or exudate on post pharynx.  Tonsils not swollen or erythematous. Hearing normal.  Neck: Supple, thyroid normal.  Respiratory: Respiratory effort normal, BS equal bilaterally without rales, rhonchi, wheezing or stridor.  Cardio: RRR with no MRGs. Brisk peripheral pulses without edema.  Abdomen: Soft, + BS.  Non tender, no guarding, rebound, hernias, masses. Lymphatics: Non tender without lymphadenopathy.  Musculoskeletal: Full ROM, 5/5 strength, normal gait.  Skin: Warm, dry without rashes, lesions, ecchymosis.  Neuro: Cranial nerves intact. Normal muscle tone, no cerebellar symptoms. Sensation intact.  Psych: Awake and oriented X 3, normal affect, Insight and Judgment appropriate.     [name], NP 10:00 AM [facility]

## 2021-12-06 ENCOUNTER — Encounter: Payer: Self-pay | Admitting: Nurse Practitioner

## 2021-12-06 ENCOUNTER — Ambulatory Visit (INDEPENDENT_AMBULATORY_CARE_PROVIDER_SITE_OTHER): Payer: BC Managed Care – PPO | Admitting: Nurse Practitioner

## 2021-12-06 DIAGNOSIS — L858 Other specified epidermal thickening: Secondary | ICD-10-CM

## 2021-12-06 DIAGNOSIS — L821 Other seborrheic keratosis: Secondary | ICD-10-CM

## 2021-12-06 DIAGNOSIS — Z23 Encounter for immunization: Secondary | ICD-10-CM | POA: Diagnosis not present

## 2021-12-06 DIAGNOSIS — I10 Essential (primary) hypertension: Secondary | ICD-10-CM | POA: Diagnosis not present

## 2021-12-06 MED ORDER — PHENDIMETRAZINE TARTRATE 35 MG PO TABS: ORAL_TABLET | ORAL | 0 refills | Status: DC

## 2021-12-06 NOTE — Patient Instructions (Signed)
Fair life protein shakes Eat more frequently - try not to go more than 6 hours without protein Aim for 90 grams of protein a day- 30 breakfast/30 lunch 30 dinner Try to keep net carbs less than 50 Net Carbs=Total Carbs-fiber- sugar alcohols Exercise heartrate 120-140(fat burning zone)- walking 20-30 minutes 4 days a week  

## 2021-12-08 ENCOUNTER — Ambulatory Visit: Payer: BC Managed Care – PPO | Admitting: Nurse Practitioner

## 2021-12-12 DIAGNOSIS — G4733 Obstructive sleep apnea (adult) (pediatric): Secondary | ICD-10-CM | POA: Diagnosis not present

## 2021-12-15 NOTE — Progress Notes (Signed)
Assessment and Plan:   Samantha Greene was seen today for follow-up.  Diagnoses and all orders for this visit:  Mild episode of recurrent major depressive disorder ([place])  Continue to follow with Eino Farber PA-C [place]  Continue current medications  Monitor symptoms  If any thoughts of hurting self or others she is to go to the ER immediately.   Essential hypertension -     CBC with Differential/Platelet -  continue medications: Telmisartan 40 mg and HCTZ 12.5 mg QD, DASH diet, exercise and monitor at home. Call if greater than 130/80.   - Go to the ER if any chest pain, shortness of breath, nausea, dizziness, severe HA, changes vision/speech  - CBC  Mixed hyperlipidemia -     COMPLETE METABOLIC PANEL WITH GFR -     Lipid panel - Continue Rosuvastatin 5 mg, diet and exercise  Gastroesophageal reflux disease without esophagitis -     Magnesium - Continue Prilosec and behavior modifications  Morbid obesity ([place]). - Increase exercise, start focusing on limiting processed carbs and saturated fats  Vitamin D deficiency Continue Vit D supplementation to maintain value in therapeutic level of 60-100   Medication management  Continued  Abnormal glucose  Continue to work on diet and exercise  A1c  Insomnia Continue practicing good sleep hygiene Continue Eszopiclone, avoid nightly use  Obesity Long discussion about weight loss, diet, and exercise Recommended diet heavy in fruits and veggies and low in animal meats, cheeses, and dairy products, appropriate calorie intake Patient will work on decreasing saturated fats and simple carbs, increase protein Increase Phendimetrazine to 2 tabs BID and monitor for side effects, chest pain, shortness of breath, palpitations Follow up at next visit  Cutaneous horn/left thigh skin lesion Alcohol for cleansing and 1% Lidocaine without epinephrine for anesthetic, with sterile technique a 5 mm punch biopsy was  used to obtain a biopsy specimen of the lesion. Hemostasis was obtained by pressure and silver nitrate followed by Antibiotic pressure dressing , and wound care instructions provided. Be alert for any signs of cutaneous infection. The specimen is labeled and sent to pathology for evaluation. The procedure was well tolerated without complications.    Further disposition pending results if labs check today. Discussed med's effects and SE's.   Over 30 minutes of face to face interview, exam, counseling, chart review, and critical decision making was performed.   Discussed med's effects and SE's. Screening labs and tests as requested with regular follow-up as recommended. Future Appointments  Date Time Provider [place]  03/17/2022 10:00 AM [name], NP [facility] None  05/20/2022  1:30 PM Princess Bruins, MD [facility] None  12/08/2022  2:45 PM Frann Rider, NP [facility] None    HPI  51 y.o. female  presents for 6 month follow up of HTN, Hyperlipidemia, Obesity, Depression, Abnormal glucose, GERD  She just got back from a 3 week vacation. 2 weeks at a campground then [facility] a week.   She has OSA on CPAP.  She is wearing it regularly.   Her heartburn is controlled with Omeprazole unless dietary choices are poor.   BMI is Body mass index is 47.54 kg/m., she is working on diet and exercise. She is not currently exercising and has not been focusing on her diet. She just got back from a 3 week vacation. She has met with a nutritionist. Was started on Phendimetrazine and is currently on 1 pill a day but does not seem to be  working to help control appetite Medication is no Wt Readings from Last 3 Encounters:  12/17/21 243 lb 6.4 oz (110.4 kg)  12/06/21 239 lb (108.4 kg)  12/02/21 240 lb 8 oz (109.1 kg)   Her blood pressure has been controlled at home, currently on Telmisartan 5m QD and HCTZ 12.5 mg QD, today their BP is BP: 120/82   BP Readings from Last 3 Encounters:   12/17/21 120/82  12/06/21 (!) 152/84  12/02/21 (!) 143/88  She does workout. She denies chest pain, shortness of breath, dizziness.   She is on cholesterol medication, Rosuvastatin 5 mg,  and denies myalgias. Her cholesterol is at goal. The cholesterol last visit was:  Lab Results  Component Value Date   CHOL 194 06/22/2021   HDL 71 06/22/2021   LDLCALC 92 06/22/2021   TRIG 213 (H) 06/22/2021   CHOLHDL 2.7 06/22/2021  . She has been working on diet and exercise for prediabetes, she is on bASA, she is on ACE/ARB and denies foot ulcerations, hyperglycemia, hypoglycemia , increased appetite, nausea, paresthesia of the feet, polydipsia, polyuria, visual disturbances, vomiting and weight loss. Last A1C in the office was:  Lab Results  Component Value Date   HGBA1C 5.7 (H) 03/17/2021   Patient is on Vitamin D supplement, she is on 5000 daily.   Lab Results  Component Value Date   VD25OH 44 03/17/2021     She is currently on Wellbutrin 3021mQD and Cymbalta 6048mD. Also using Rexulti 1 mg. Mood is stable. She follows with  Jo Eino Farber-C with [facility]    Current Medications:   Current Outpatient Medications (Endocrine & Metabolic):    norethindrone (MICRONOR) 0.35 MG tablet, Take 1 tablet (0.35 mg total) by mouth daily.  Current Outpatient Medications (Cardiovascular):    hydrochlorothiazide (HYDRODIURIL) 12.5 MG tablet, Take 2 tablets (25 mg total) by mouth daily.   rosuvastatin (CRESTOR) 5 MG tablet, TAKE 1 TABLET BY MOUTH EVERY DAY FOR CHOLESTEROL   telmisartan (MICARDIS) 40 MG tablet, TAKE 1 TABLET BY MOUTH EVERY DAY FOR BLOOD PRESSURE  Current Outpatient Medications (Respiratory):    albuterol (VENTOLIN HFA) 108 (90 Base) MCG/ACT inhaler, INHALE 2 PUFFS INTO THE LUNGS EVERY 4 HOURS AS NEEDED FOR WHEEZING OR SHORTNESS OF BREATH.   cetirizine (ZYRTEC) 10 MG tablet, Take 10 mg by mouth daily.   fexofenadine (ALLEGRA) 180 MG tablet, Take 180 mg by mouth daily as needed  for allergies or rhinitis. Alternates with zyrtec when Zyrtec seems to not be as effective   levocetirizine (XYZAL) 5 MG tablet, Take 5 mg by mouth at bedtime.   montelukast (SINGULAIR) 10 MG tablet, TAKE 1 TABLET BY MOUTH DAILY FOR ALLERGIES  Current Outpatient Medications (Analgesics):    aspirin 81 MG tablet, Take 81 mg by mouth daily.   meloxicam (MOBIC) 15 MG tablet, Take   1/2-1 tablet    Daily    with Food    for Pain & Inflammation & try LIMIT to 5 days /week to Avoid Kidney Damage   Current Outpatient Medications (Other):    baclofen (LIORESAL) 10 MG tablet, TAKE 1/2 TO 1 TABLET 1 OR 2 X /DAY AS NEEDED FOR MUSCLE SPASMS / TAKE BY MOUTH   brexpiprazole (REXULTI) 1 MG TABS tablet, Take by mouth.   buPROPion (WELLBUTRIN XL) 300 MG 24 hr tablet, TAKE 1 TABLET EVERY MORNING FOR MOOD, FOCUS & CONCENTRATION   Cholecalciferol (VITAMIN D) 125 MCG (5000 UT) CAPS, Take 10,000 Units by mouth daily.  DULoxetine (CYMBALTA) 60 MG capsule, TAKE 1 CAPSULE BY MOUTH EVERY DAY   Eszopiclone 3 MG TABS, TAKE 1 IMMEDIATELY BEFORE BEDTIME   gabapentin (NEURONTIN) 300 MG capsule, TAKE 1 CAPSULE BY MOUTH THREE TIMES A DAY   Multiple Vitamin (MULTIVITAMIN) tablet, Take 1 tablet by mouth daily.   omeprazole (PRILOSEC) 40 MG capsule, TAKE 1 CAPSULE BY MOUTH EVERY DAY   Phendimetrazine Tartrate 35 MG TABS, 1/2- 1 tab first thing in the morning   triamcinolone ointment (KENALOG) 0.1 %, Apply 1 application topically 2 (two) times daily. (Patient taking differently: Apply 1 application  topically daily as needed (rash).)  Health Maintenance:   Immunization History  Administered Date(s) Administered   Influenza Inj Mdck Quad With Preservative 10/25/2016   Influenza, High Dose Seasonal PF 12/06/2021   Influenza, Seasonal, Injecte, Preservative Fre 01/01/2015   Influenza,inj,Quad PF,6+ Mos 10/25/2012, 01/20/2020, 11/16/2020   Influenza-Unspecified 06/05/2018, 12/06/2018   PFIZER(Purple Top)SARS-COV-2 Vaccination  05/03/2019, 05/29/2019   PPD Test 04/11/2013   Pneumococcal Polysaccharide-23 03/28/2012   Tdap 12/12/2012   Tetanus: 2014 Pneumovax: 2014 Flu vaccine: 2020 at [place]  Pap: Vicente Masson MGM: 06/2019 Last Dental Exam: Yearly visit Colonoscopy: Referral placed today   Patient Care Team: Unk Pinto, MD as PCP - General (Internal Medicine) Lahoma Crocker, MD as Consulting Physician (Obstetrics and Gynecology) Daryll Brod, MD as Consulting Physician (Orthopedic Surgery) Unk Pinto, MD as Referring Physician (Internal Medicine)  Medical History:  Past Medical History:  Diagnosis Date   Anxiety    Arthritis    Bipolar disorder, unspecified ([place])    Depression    no meds   Diaphragmatic hernia with obstruction, without gangrene 11/07/2018   Dyspnea    doe with exertion occ   Family history of breast cancer    Family history of colon cancer    Family history of lung cancer    Family history of pancreatic cancer    Family history of prostate cancer    GERD (gastroesophageal reflux disease)    Hernia, diaphragmatic    History of kidney stones    Hyperlipidemia    Hypertension    no meds   Pneumonia    1 time   Allergies Allergies  Allergen Reactions   Doxycycline Shortness Of Breath and Anxiety   Erythromycin Nausea Only and Other (See Comments)    Stomach cramping (can take Z Pak)   Flexeril [Cyclobenzaprine Hcl] Other (See Comments)    "don't like the way I feel"   Metformin And Related Other (See Comments)    GI Upset    SURGICAL HISTORY She  has a past surgical history that includes Dilation and evacuation; Dilation and curettage of uterus; Video assisted thoracoscopy (vats)/thorocotomy (Right, 03/23/2018); epigastric hernia repair (N/A, 03/23/2018); IR THORACENTESIS ASP PLEURAL SPACE W/IMG GUIDE (04/19/2018); and Dilatation & curettage/hysteroscopy with myosure (N/A, 10/18/2019).   FAMILY HISTORY Her family history includes Atrial fibrillation  in her maternal aunt; Breast cancer in her cousin and maternal aunt; Breast cancer (age of onset: 51) in her mother; Cervical cancer in her paternal aunt; Colon cancer in her maternal aunt, maternal aunt, maternal uncle, and maternal uncle; Colon polyps in her maternal aunt and mother; Congestive Heart Failure in her mother; Diabetes in her brother, maternal aunt, maternal uncle, and mother; Heart attack in her maternal aunt; Heart disease in her maternal aunt, maternal grandmother, and maternal uncle; Hemochromatosis in her paternal uncle; Hyperlipidemia in her brother, maternal aunt, maternal uncle, and mother; Hypertension in her brother, maternal aunt,  maternal uncle, and mother; Kidney disease in her mother; Lung cancer in her mother; Ovarian cancer in her paternal aunt; Polycystic ovary syndrome in her paternal aunt; Prostate cancer in her cousin, cousin, and maternal uncle; Stroke in her maternal aunt, maternal aunt, and maternal uncle.   SOCIAL HISTORY She  reports that she has never smoked. She has never used smokeless tobacco. She reports current alcohol use. She reports that she does not use drugs.  Review of Systems: Review of Systems  Constitutional:  Negative for chills, fever and malaise/fatigue.       Weight gain  HENT:  Negative for congestion, ear pain and sore throat.   Eyes: Negative.   Respiratory:  Negative for cough, shortness of breath and wheezing.   Cardiovascular:  Negative for chest pain, palpitations and leg swelling.  Gastrointestinal:  Positive for heartburn. Negative for abdominal pain, blood in stool, constipation, diarrhea, melena, nausea and vomiting.  Genitourinary:  Negative for dysuria, frequency, hematuria and urgency.  Musculoskeletal:  Negative for back pain, joint pain, myalgias and neck pain.  Skin:        Skin lesion of left thigh that has increased in size  Neurological:  Negative for dizziness, loss of consciousness and headaches.   Psychiatric/Behavioral:  Positive for depression. The patient is not nervous/anxious and does not have insomnia.     Physical Exam: Estimated body mass index is 47.54 kg/m as calculated from the following:   Height as of this encounter: 5' (1.524 m).   Weight as of this encounter: 243 lb 6.4 oz (110.4 kg). BP 120/82   Pulse 94   Temp 97.6 F (36.4 C)   Ht 5' (1.524 m)   Wt 243 lb 6.4 oz (110.4 kg)   SpO2 95%   BMI 47.54 kg/m   General Appearance: Obese pleasant female, in no apparent distress.  Eyes: PERRLA, EOMs, conjunctiva no swelling or erythema ENT/Mouth: Ear canals normal without obstruction, swelling, erythema, or discharge.  TMs normal bilaterally with no erythema, bulging, retraction, or loss of landmark.  Oropharynx moist and clear with no exudate, erythema, or swelling.  Neck: Supple, thyroid normal. No bruits.  No cervical adenopathy Respiratory: Respiratory effort normal, Breath sounds clear A&P without wheeze, rhonchi, rales.   Cardio: RRR without murmurs, rubs or gallops. Brisk peripheral pulses without edema.  Abdomen: Soft, nontender, no guarding, rebound, hernias, masses, or organomegaly.  Lymphatics: Non tender without lymphadenopathy.  Musculoskeletal: Full ROM all peripheral extremities,5/5 strength, and normal gait.  Skin: Warm, dry . Left anterior thigh has 0.5-1 cm raised cutaneous horn appearing lesion Neuro: Awake and oriented X 3, Cranial nerves intact, reflexes equal bilaterally. Normal muscle tone, no cerebellar symptoms.  Psych:  normal affect, Insight and Judgment appropriate.   Alcohol for cleansing and 1% Lidocaine without epinephrine for anesthetic, with sterile technique a 5 mm punch biopsy was used to obtain a biopsy specimen of the lesion. Hemostasis was obtained by pressure , silver nitrate and pressure Antibiotic dressing is applied, and wound care instructions provided. Be alert for any signs of cutaneous infection. The specimen is labeled and  sent to pathology for evaluation. The procedure was well tolerated without complications.   Magda Bernheim ANP-C  [facility]  12/17/2021

## 2021-12-17 ENCOUNTER — Ambulatory Visit: Payer: BC Managed Care – PPO | Admitting: Nurse Practitioner

## 2021-12-17 ENCOUNTER — Other Ambulatory Visit: Payer: Self-pay

## 2021-12-17 ENCOUNTER — Encounter: Payer: Self-pay | Admitting: Nurse Practitioner

## 2021-12-17 DIAGNOSIS — I10 Essential (primary) hypertension: Secondary | ICD-10-CM | POA: Diagnosis not present

## 2021-12-17 DIAGNOSIS — K219 Gastro-esophageal reflux disease without esophagitis: Secondary | ICD-10-CM

## 2021-12-17 DIAGNOSIS — Z6838 Body mass index (BMI) 38.0-38.9, adult: Secondary | ICD-10-CM

## 2021-12-17 DIAGNOSIS — R7309 Other abnormal glucose: Secondary | ICD-10-CM | POA: Diagnosis not present

## 2021-12-17 DIAGNOSIS — E782 Mixed hyperlipidemia: Secondary | ICD-10-CM | POA: Diagnosis not present

## 2021-12-17 DIAGNOSIS — F419 Anxiety disorder, unspecified: Secondary | ICD-10-CM | POA: Diagnosis not present

## 2021-12-17 DIAGNOSIS — R7989 Other specified abnormal findings of blood chemistry: Secondary | ICD-10-CM

## 2021-12-17 DIAGNOSIS — F4310 Post-traumatic stress disorder, unspecified: Secondary | ICD-10-CM | POA: Diagnosis not present

## 2021-12-17 DIAGNOSIS — F330 Major depressive disorder, recurrent, mild: Secondary | ICD-10-CM

## 2021-12-17 DIAGNOSIS — B079 Viral wart, unspecified: Secondary | ICD-10-CM | POA: Diagnosis not present

## 2021-12-17 DIAGNOSIS — Z79899 Other long term (current) drug therapy: Secondary | ICD-10-CM

## 2021-12-17 DIAGNOSIS — L989 Disorder of the skin and subcutaneous tissue, unspecified: Secondary | ICD-10-CM

## 2021-12-17 DIAGNOSIS — F331 Major depressive disorder, recurrent, moderate: Secondary | ICD-10-CM | POA: Diagnosis not present

## 2021-12-17 DIAGNOSIS — E66812 Obesity, class 2: Secondary | ICD-10-CM

## 2021-12-17 DIAGNOSIS — E662 Morbid (severe) obesity with alveolar hypoventilation: Secondary | ICD-10-CM

## 2021-12-17 DIAGNOSIS — L858 Other specified epidermal thickening: Secondary | ICD-10-CM

## 2021-12-17 DIAGNOSIS — G4700 Insomnia, unspecified: Secondary | ICD-10-CM

## 2021-12-17 MED ORDER — PHENDIMETRAZINE TARTRATE 35 MG PO TABS: ORAL_TABLET | ORAL | 0 refills | Status: DC

## 2021-12-17 MED ORDER — ESZOPICLONE 3 MG PO TABS: ORAL_TABLET | ORAL | 0 refills | Status: DC

## 2021-12-17 NOTE — Patient Instructions (Signed)
Skin Biopsy, Care After The following information offers guidance on how to care for yourself after your procedure. Your health care provider may also give you more specific instructions. If you have problems or questions, contact your health care provider. What can I expect after the procedure? After the procedure, it is common to have: Soreness or mild pain. Bruising. Itching. Some redness and swelling. Follow these instructions at home: Biopsy site care  Follow instructions from your health care provider about how to take care of your biopsy site. Make sure you: Wash your hands with soap and water for at least 20 seconds before and after you change your bandage (dressing). If soap and water are not available, use hand sanitizer. Change your dressing as told by your health care provider. Leave stitches (sutures), skin glue, or adhesive strips in place. These skin closures may need to stay in place for 2 weeks or longer. If adhesive strip edges start to loosen and curl up, you may trim the loose edges. Do not remove adhesive strips completely unless your health care provider tells you to do that. Check your biopsy site every day for signs of infection. Check for: More redness, swelling, or pain. Fluid or blood. Warmth. Pus or a bad smell. Do not take baths, swim, or use a hot tub until your health care provider approves. Ask your health care provider if you may take showers. You may only be allowed to take sponge baths. General instructions Take over-the-counter and prescription medicines only as told by your health care provider. Return to your normal activities as told by your health care provider. Ask your health care provider what activities are safe for you. Keep all follow-up visits. This is important. Contact a health care provider if: You have more redness, swelling, or pain around your biopsy site. You have fluid or blood coming from your biopsy site. Your biopsy site feels warm  to the touch. You have pus or a bad smell coming from your biopsy site. You have a fever. Your sutures, skin glue, or adhesive strips loosen or come off sooner than expected. Get help right away if: You have bleeding that does not stop with pressure or a dressing. Summary After the procedure, it is common to have soreness, bruising, and itching at the site. Follow instructions from your health care provider about how to take care of your biopsy site. Check your biopsy site every day for signs of infection. Contact a health care provider if you have more redness, swelling, or pain around your biopsy site, or your biopsy site feels warm to the touch. Keep all follow-up visits. This is important. This information is not intended to replace advice given to you by your health care provider. Make sure you discuss any questions you have with your health care provider. Document Revised: 08/18/2020 Document Reviewed: 08/18/2020 Elsevier Patient Education  2023 [employer]vier Inc.  

## 2021-12-18 LAB — COMPLETE METABOLIC PANEL WITH GFR
AG Ratio: 1.8 (calc) (ref 1.0–2.5)
ALT: 58 U/L — ABNORMAL HIGH (ref 6–29)
AST: 39 U/L — ABNORMAL HIGH (ref 10–35)
Albumin: 4.6 g/dL (ref 3.6–5.1)
Alkaline phosphatase (APISO): 66 U/L (ref 37–153)
BUN: 16 mg/dL (ref 7–25)
CO2: 26 mmol/L (ref 20–32)
Calcium: 9.4 mg/dL (ref 8.6–10.4)
Chloride: 103 mmol/L (ref 98–110)
Creat: 0.93 mg/dL (ref 0.50–1.03)
Globulin: 2.5 g/dL (calc) (ref 1.9–3.7)
Glucose, Bld: 93 mg/dL (ref 65–99)
Potassium: 4.6 mmol/L (ref 3.5–5.3)
Sodium: 138 mmol/L (ref 135–146)
Total Bilirubin: 0.7 mg/dL (ref 0.2–1.2)
Total Protein: 7.1 g/dL (ref 6.1–8.1)
eGFR: 74 mL/min/{1.73_m2} (ref >=60)

## 2021-12-18 LAB — HEMOGLOBIN A1C
Hgb A1c MFr Bld: 6 % of total Hgb — ABNORMAL HIGH (ref <5.7)
Mean Plasma Glucose: 126 mg/dL
eAG (mmol/L): 7 mmol/L

## 2021-12-18 LAB — CBC WITH DIFFERENTIAL/PLATELET
Absolute Monocytes: 539 cells/uL (ref 200–950)
Basophils Absolute: 29 cells/uL (ref 0–200)
Basophils Relative: 0.5 %
Eosinophils Absolute: 122 cells/uL (ref 15–500)
Eosinophils Relative: 2.1 %
HCT: 38.7 % (ref 35.0–45.0)
Hemoglobin: 13.4 g/dL (ref 11.7–15.5)
Lymphs Abs: 1833 cells/uL (ref 850–3900)
MCH: 32.4 pg (ref 27.0–33.0)
MCHC: 34.6 g/dL (ref 32.0–36.0)
MCV: 93.7 fL (ref 80.0–100.0)
MPV: 9.2 fL (ref 7.5–12.5)
Monocytes Relative: 9.3 %
Neutro Abs: 3277 cells/uL (ref 1500–7800)
Neutrophils Relative %: 56.5 %
Platelets: 286 10*3/uL (ref 140–400)
RBC: 4.13 10*6/uL (ref 3.80–5.10)
RDW: 12.2 % (ref 11.0–15.0)
Total Lymphocyte: 31.6 %
WBC: 5.8 10*3/uL (ref 3.8–10.8)

## 2021-12-18 LAB — LIPID PANEL
Cholesterol: 165 mg/dL (ref <200)
HDL: 66 mg/dL (ref >=50)
LDL Cholesterol (Calc): 72 mg/dL (calc)
Non-HDL Cholesterol (Calc): 99 mg/dL (calc) (ref <130)
Total CHOL/HDL Ratio: 2.5 (calc) (ref <5.0)
Triglycerides: 204 mg/dL — ABNORMAL HIGH (ref <150)

## 2021-12-21 DIAGNOSIS — G4733 Obstructive sleep apnea (adult) (pediatric): Secondary | ICD-10-CM | POA: Diagnosis not present

## 2022-01-11 DIAGNOSIS — G4733 Obstructive sleep apnea (adult) (pediatric): Secondary | ICD-10-CM | POA: Diagnosis not present

## 2022-01-14 ENCOUNTER — Encounter: Payer: Self-pay | Admitting: Licensed Clinical Social Worker

## 2022-01-14 NOTE — Progress Notes (Signed)
UPDATE: VUS called CDH1 [id number] has been reclassified to "Likely Benign." The report date is 01/14/2022.

## 2022-01-20 DIAGNOSIS — G4733 Obstructive sleep apnea (adult) (pediatric): Secondary | ICD-10-CM | POA: Diagnosis not present

## 2022-01-29 ENCOUNTER — Encounter: Payer: Self-pay | Admitting: Nurse Practitioner

## 2022-02-08 ENCOUNTER — Encounter: Payer: Self-pay | Admitting: Nurse Practitioner

## 2022-02-08 ENCOUNTER — Other Ambulatory Visit: Payer: Self-pay | Admitting: Nurse Practitioner

## 2022-02-11 ENCOUNTER — Other Ambulatory Visit: Payer: Self-pay | Admitting: Obstetrics & Gynecology

## 2022-02-14 NOTE — Telephone Encounter (Signed)
Med refill request: Norethindrone 0.35 mg tab Last AEX: 05/18/21/ ML Next AEX: 05/20/22/ ML  Last MMG (if hormonal med) 06/21/19 Dx Bilateral MMG: IMPRESSION: 1. Benign inflamed versus infected sebaceous cyst within the UPPER INNER RIGHT breast. Clinical follow-up recommended. 2. No mammographic evidence of breast malignancy.   RECOMMENDATION: Bilateral screening mammogram in 1 year. Refill authorized: Please Advise?   Rx sent 05/18/21 #84/ 4RF Refill not appropriate.  Rx refused.   Routing to provider for final review.  Will close encounter.

## 2022-02-20 DIAGNOSIS — G4733 Obstructive sleep apnea (adult) (pediatric): Secondary | ICD-10-CM | POA: Diagnosis not present

## 2022-03-16 NOTE — Progress Notes (Signed)
THIS ENCOUNTER IS A VIRTUAL VISIT DUE TO COVID-19 - PATIENT WAS NOT SEEN IN THE OFFICE.  PATIENT HAS CONSENTED TO VIRTUAL VISIT / TELEMEDICINE VISIT   Virtual Visit via telephone Note  I connected with  Samantha Greene on 03/17/2022 by telephone.  I verified that I am speaking with the correct person using two identifiers.    I discussed the limitations of evaluation and management by telemedicine and the availability of in person appointments. The patient expressed understanding and agreed to proceed.  History of Present Illness:  Pulse 84   Temp (!) 97.3 F (36.3 C)   Ht 5' (1.524 m)   SpO2 97%   BMI 47.54 kg/m  52 y.o. patient contacted office reporting URI sx . she tested positive by test in office parking lot. OV was conducted by telephone to minimize exposure. This patient ws vaccinated for covid 19, last 2021- had the first  Sx began 5 days ago with Fever/chills, congestion, productive cough of brown mucus, Headache, fatigue, body aches, loss of taste  Treatments tried so far: Mucinex DM  Exposures: unknown   Medications  Current Outpatient Medications (Endocrine & Metabolic):    norethindrone (MICRONOR) 0.35 MG tablet, Take 1 tablet (0.35 mg total) by mouth daily.  Current Outpatient Medications (Cardiovascular):    hydrochlorothiazide (HYDRODIURIL) 12.5 MG tablet, Take 2 tablets (25 mg total) by mouth daily.   rosuvastatin (CRESTOR) 5 MG tablet, TAKE 1 TABLET BY MOUTH EVERY DAY FOR CHOLESTEROL   telmisartan (MICARDIS) 40 MG tablet, TAKE 1 TABLET BY MOUTH EVERY DAY FOR BLOOD PRESSURE  Current Outpatient Medications (Respiratory):    albuterol (VENTOLIN HFA) 108 (90 Base) MCG/ACT inhaler, INHALE 2 PUFFS INTO THE LUNGS EVERY 4 HOURS AS NEEDED FOR WHEEZING OR SHORTNESS OF BREATH.   cetirizine (ZYRTEC) 10 MG tablet, Take 10 mg by mouth daily.   fexofenadine (ALLEGRA) 180 MG tablet, Take 180 mg by mouth daily as needed for allergies or rhinitis. Alternates with zyrtec  when Zyrtec seems to not be as effective   levocetirizine (XYZAL) 5 MG tablet, Take 5 mg by mouth at bedtime.   montelukast (SINGULAIR) 10 MG tablet, TAKE 1 TABLET BY MOUTH DAILY FOR ALLERGIES  Current Outpatient Medications (Analgesics):    aspirin 81 MG tablet, Take 81 mg by mouth daily.   meloxicam (MOBIC) 15 MG tablet, Take   1/2-1 tablet    Daily    with Food    for Pain & Inflammation & try LIMIT to 5 days /week to Avoid Kidney Damage   Current Outpatient Medications (Other):    baclofen (LIORESAL) 10 MG tablet, TAKE 1/2 TO 1 TABLET 1 OR 2 X /DAY AS NEEDED FOR MUSCLE SPASMS / TAKE BY MOUTH   brexpiprazole (REXULTI) 1 MG TABS tablet, Take by mouth.   buPROPion (WELLBUTRIN XL) 300 MG 24 hr tablet, TAKE 1 TABLET EVERY MORNING FOR MOOD, FOCUS & CONCENTRATION   Cholecalciferol (VITAMIN D) 125 MCG (5000 UT) CAPS, Take 10,000 Units by mouth daily.   DULoxetine (CYMBALTA) 60 MG capsule, TAKE 1 CAPSULE BY MOUTH EVERY DAY   Eszopiclone 3 MG TABS, TAKE 1 IMMEDIATELY BEFORE BEDTIME   gabapentin (NEURONTIN) 300 MG capsule, TAKE 1 CAPSULE BY MOUTH THREE TIMES A DAY   Multiple Vitamin (MULTIVITAMIN) tablet, Take 1 tablet by mouth daily.   omeprazole (PRILOSEC) 40 MG capsule, TAKE 1 CAPSULE BY MOUTH EVERY DAY   Phendimetrazine Tartrate 35 MG TABS, 1-2 tabs twice a day as needed for appetite suppression  triamcinolone ointment (KENALOG) 0.1 %, Apply 1 application topically 2 (two) times daily. (Patient taking differently: Apply 1 application  topically daily as needed (rash).)  Allergies:  Allergies  Allergen Reactions   Doxycycline Shortness Of Breath and Anxiety   Erythromycin Nausea Only and Other (See Comments)    Stomach cramping (can take Z Pak)   Flexeril [Cyclobenzaprine Hcl] Other (See Comments)    "don't like the way I feel"   Metformin And Related Other (See Comments)    GI Upset    Problem list She has Insomnia; Depression; Hyperlipidemia; Hypertension; Anxiety; Dyspnea; Vitamin  D deficiency; Psoriasis, guttate; Morbid obesity ([place]); Carpal tunnel syndrome on both sides; Family history of breast cancer; Family history of colon cancer; Family history of prostate cancer; Family history of lung cancer; Family history of pancreatic cancer; Genetic testing; S/P Thoracotomy with Plication of Diaphragm; Chronic insomnia; Class 2 obesity with alveolar hypoventilation, serious comorbidity, and body mass index (BMI) of 38.0 to 38.9 in adult [facility]); DOE (dyspnea on exertion); OSA on CPAP; Abnormal glucose; Severe sleep apnea; CPAP (continuous positive airway pressure) dependence; Diaphragmatic hernia with obstruction, without gangrene; Morbid obesity with body mass index (BMI) of 40.0 to 44.9 in adult [facility]); Persistent hypersomnia; and Upper airway cough syndrome on their problem list.   Social History:   reports that she has never smoked. She has never used smokeless tobacco. She reports current alcohol use. She reports that she does not use drugs.  Observations/Objective:  General : Well sounding patient in no apparent distress HEENT: no hoarseness, no cough for duration of visit Lungs: speaks in complete sentences, no audible wheezing, no apparent distress Neurological: alert, oriented x 3 Psychiatric: pleasant, judgement appropriate   Assessment and Plan:  Covid 19 Covid 19 positive per rapid screening test in office parking Risk factors include: has Insomnia; Depression; Hyperlipidemia; Hypertension; Anxiety; Dyspnea; Vitamin D deficiency; Psoriasis, guttate; Morbid obesity ([place]); Carpal tunnel syndrome on both sides; Family history of breast cancer; Family history of colon cancer; Family history of prostate cancer; Family history of lung cancer; Family history of pancreatic cancer; Genetic testing; S/P Thoracotomy with Plication of Diaphragm; Chronic insomnia; Class 2 obesity with alveolar hypoventilation, serious comorbidity, and body mass index (BMI) of 38.0 to 38.9 in adult  [facility] DOE (dyspnea on exertion); OSA on CPAP; Abnormal glucose; Severe sleep apnea; CPAP (continuous positive airway pressure) dependence; Diaphragmatic hernia with obstruction, without gangrene; Morbid obesity with body mass index (BMI) of 40.0 to 44.9 in adult [facility]); Persistent hypersomnia; and Upper airway cough syndrome on their problem list.  Symptoms are: mild Too late to take antivirals Immue support reviewed Vit C, Vit D, Zinc Take tylenol PRN temp 101+ Push hydration Regular ambulation or calf exercises exercises for clot prevention and 81 mg ASA unless contraindicated Sx supportive therapy suggested Follow up via mychart or telephone if needed Advised patient obtain O2 monitor; present to ED if persistently <90% or with severe dyspnea, CP, fever uncontrolled by tylenol, confusion, sudden decline Should remain in isolation 5 days from testing positive and then wear a mask when around other people for the following 5 days  Diagnoses and all orders for this visit:  Flu-like symptoms -     POCT Influenza A/B- Negative  Encounter for screening for COVID-19 -     POC COVID-19- Positive  COVID -     dexamethasone (DECADRON) 1 MG tablet; Take 3 tabs for 3 days, 2 tabs for 3 days 1 tab for 5 days. Take with  food. -     azithromycin (ZITHROMAX) 250 MG tablet; Take 2 tablets (500 mg) on  Day 1,  followed by 1 tablet (250 mg) once daily on Days 2 through 5. -     promethazine-dextromethorphan (PROMETHAZINE-DM) 6.25-15 MG/5ML syrup; Take 5 mLs by mouth 4 (four) times daily as needed for cough.    Follow Up Instructions:  I discussed the assessment and treatment plan with the patient. The patient was provided an opportunity to ask questions and all were answered. The patient agreed with the plan and demonstrated an understanding of the instructions.   The patient was advised to call back or seek an in-person evaluation if the symptoms worsen or if the condition fails to improve as  anticipated.  I provided 20 minutes of non-face-to-face time during this encounter.   [name], NP

## 2022-03-17 ENCOUNTER — Encounter: Payer: Self-pay | Admitting: Nurse Practitioner

## 2022-03-17 ENCOUNTER — Ambulatory Visit: Payer: BC Managed Care – PPO | Admitting: Nurse Practitioner

## 2022-03-17 ENCOUNTER — Other Ambulatory Visit: Payer: Self-pay

## 2022-03-17 VITALS — HR 84 | Temp 97.3°F | Ht 60.0 in

## 2022-03-17 DIAGNOSIS — R7309 Other abnormal glucose: Secondary | ICD-10-CM

## 2022-03-17 DIAGNOSIS — Z1152 Encounter for screening for COVID-19: Secondary | ICD-10-CM

## 2022-03-17 DIAGNOSIS — U071 COVID-19: Secondary | ICD-10-CM

## 2022-03-17 DIAGNOSIS — Z79899 Other long term (current) drug therapy: Secondary | ICD-10-CM

## 2022-03-17 DIAGNOSIS — R6889 Other general symptoms and signs: Secondary | ICD-10-CM

## 2022-03-17 DIAGNOSIS — Z0001 Encounter for general adult medical examination with abnormal findings: Secondary | ICD-10-CM

## 2022-03-17 DIAGNOSIS — E782 Mixed hyperlipidemia: Secondary | ICD-10-CM

## 2022-03-17 DIAGNOSIS — G4733 Obstructive sleep apnea (adult) (pediatric): Secondary | ICD-10-CM | POA: Diagnosis not present

## 2022-03-17 DIAGNOSIS — F330 Major depressive disorder, recurrent, mild: Secondary | ICD-10-CM

## 2022-03-17 DIAGNOSIS — E559 Vitamin D deficiency, unspecified: Secondary | ICD-10-CM

## 2022-03-17 DIAGNOSIS — Z1329 Encounter for screening for other suspected endocrine disorder: Secondary | ICD-10-CM

## 2022-03-17 DIAGNOSIS — L404 Guttate psoriasis: Secondary | ICD-10-CM

## 2022-03-17 DIAGNOSIS — Z1389 Encounter for screening for other disorder: Secondary | ICD-10-CM

## 2022-03-17 DIAGNOSIS — G4700 Insomnia, unspecified: Secondary | ICD-10-CM

## 2022-03-17 DIAGNOSIS — Z136 Encounter for screening for cardiovascular disorders: Secondary | ICD-10-CM

## 2022-03-17 DIAGNOSIS — I10 Essential (primary) hypertension: Secondary | ICD-10-CM

## 2022-03-17 LAB — POC COVID19 BINAXNOW: SARS Coronavirus 2 Ag: POSITIVE — AB

## 2022-03-17 LAB — POCT INFLUENZA A/B
Influenza A, POC: NEGATIVE
Influenza B, POC: NEGATIVE

## 2022-03-17 MED ORDER — AZITHROMYCIN 250 MG PO TABS: ORAL_TABLET | ORAL | 1 refills | Status: DC

## 2022-03-17 MED ORDER — DEXAMETHASONE 1 MG PO TABS: ORAL_TABLET | ORAL | 0 refills | Status: DC

## 2022-03-17 MED ORDER — PROMETHAZINE-DM 6.25-15 MG/5ML PO SYRP: 5.0000 mL | ORAL_SOLUTION | Freq: Four times a day (QID) | ORAL | 1 refills | Status: DC | PRN

## 2022-03-17 NOTE — Patient Instructions (Signed)
COVID  Immue support reviewed Vit C, Vit D, Zinc Take tylenol PRN temp 101+ Push hydration Regular ambulation or calf exercises exercises for clot prevention and 81 mg ASA unless contraindicated Sx supportive therapy suggested Follow up via mychart or telephone if needed Advised patient obtain O2 monitor; present to ED if persistently <90% or with severe dyspnea, CP, fever uncontrolled by tylenol, confusion, sudden decline Should remain in isolation 5 days from testing positive and then wear a mask when around other people for the following 5 days

## 2022-03-18 ENCOUNTER — Other Ambulatory Visit: Payer: Self-pay | Admitting: Nurse Practitioner

## 2022-03-18 ENCOUNTER — Encounter: Payer: Self-pay | Admitting: Nurse Practitioner

## 2022-03-18 DIAGNOSIS — B029 Zoster without complications: Secondary | ICD-10-CM

## 2022-03-18 DIAGNOSIS — M5441 Lumbago with sciatica, right side: Secondary | ICD-10-CM

## 2022-03-18 MED ORDER — VALACYCLOVIR HCL 500 MG PO TABS: 500.0000 mg | ORAL_TABLET | Freq: Two times a day (BID) | ORAL | 0 refills | Status: AC

## 2022-03-20 ENCOUNTER — Other Ambulatory Visit: Payer: Self-pay | Admitting: Nurse Practitioner

## 2022-03-20 DIAGNOSIS — G4700 Insomnia, unspecified: Secondary | ICD-10-CM

## 2022-03-21 ENCOUNTER — Other Ambulatory Visit: Payer: Self-pay | Admitting: Nurse Practitioner

## 2022-03-21 MED ORDER — ESZOPICLONE 3 MG PO TABS: ORAL_TABLET | ORAL | 0 refills | Status: DC

## 2022-03-23 DIAGNOSIS — G4733 Obstructive sleep apnea (adult) (pediatric): Secondary | ICD-10-CM | POA: Diagnosis not present

## 2022-04-04 NOTE — Progress Notes (Unsigned)
COMPLETE PHYSICAL   Assessment and Plan:  Encounter for general adult medical examination with abnormal findings Yearly Unable to do EKG or AAA today due to shingles. Will complete at next OV. Strongly encouraged to schedule mammogram and colonoscopy  Essential hypertension - continue medications, DASH diet, exercise and monitor at home. Call if greater than 130/80.  -     CBC with Differential/Platelet -     COMPLETE METABOLIC PANEL WITH GFR -     TSH -     Urinalysis, Routine w reflex microscopic -     Microalbumin / creatinine urine ratio   Mixed hyperlipidemia -     Lipid panel Continue rosuvastatin check lipids decrease fatty foods increase activity.   Morbid obesity (Samantha Greene) - follow up 3 months for progress monitoring, unable to get GLP1 approved through insurance - increase veggies, decrease carbs, increase protein- try protein shake for breakfast and lunch - long discussion about weight loss, diet, and exercise  Psoriasis, guttate Monitor  Vitamin D deficiency Continue Vit D supplementation to maintain value in therapeutic level of 60-100  -     VITAMIN D 25 Hydroxy (Vit-D Deficiency, Fractures)  OSA on CPAP Continue CPAP nightly   Mild episode of recurrent major depressive disorder (HCC) Continue wellbutrin XL '300mg'$ , rexulti and Cymbalta Monitor symptoms and notify office if they are not controlled  Insomnia, unspecified type Continue medications - follow up neuro  Medication management Continued  GERD Continue diet modifications  Abnormal glucose Continue diet and exercise -     Hemoglobin A1c  Elevated LFT's - Limit alcohol and tylenol - Focus on diet and exercise - CMP  Shingles with post herpetic neuralgia - Continue Gabapentin - If does not resolve after rash is completely resolved notify the office  Screening for colorectal cancer Refer to GI for colonoscopy- has not had colonoscopy  Screening for thyroid - TSH  Screening for  blood or protein in urine Routine UA with reflex microscopic Microalbumin/creatinine urine ratio    Discussed med's effects and SE's. Screening labs and tests as requested with regular follow-up as recommended.  Further disposition pending results if labs check today. Discussed med's effects and SE's.   Over 30 minutes of face to face interview, exam, counseling, chart review, and critical decision making was performed.    Future Appointments  Date Time Provider [place]  05/20/2022  1:30 PM Princess Bruins, MD [facility] None  12/08/2022  2:45 PM Frann Rider, NP [facility] None  03/20/2023 10:00 AM Samantha Rossetti, NP [facility] None    HPI  52 y.o. female  presents for a complete physical.  She had shingles under left breast to back x 2 weeks.  Starting to dry.  Continues to have nerve pain under breast and around to the back.  She also had Covid at that same time.  She does have more shortness of breath with exertion.  Has not been using her albuterol inhaler frequently. She is using Gabapentin 300 mg TID and is helps to control pain- today was first day she could tolerate a bra  She reports that she is taking arthritis medicine, meloxicam PRN, not daily. She report breakthrough pain, joints, multiple.  Has history of depression. Currently on Buproprion 300 mg, rexulti '1mg'$  daily, cymbalta 60 mg daily. Is managed but is having a lot of stress due to weight.   BMI is Body mass index is 46.48 kg/m., she is working on diet and exercise.Will discuss later, just recovering from shingles and  covid. Wt Readings from Last 3 Encounters:  04/05/22 246 lb (111.6 kg)  12/17/21 243 lb 6.4 oz (110.4 kg)  12/06/21 239 lb (108.4 kg)   She is s/p VATS 03/23/2018, was still having SOB with exertion this has gotten worse with weight gain. She is on Singulair and Xyzal daily, uses albuterol inhaler as needed.     She wears this daily and general fatigued.  And had a sleep study that  showed severe OSA with AHI 37.5/h and possible central sleep apnea characteristics.  She uses a  CPAP, uses [employer]. She is taking gabapentin once a day at bedtime.  Her blood pressure has been controlled on Telmisartan 40 mg QD, HCTZ 12.5 mg tabs daily,  today their BP is BP: 128/84.   BP Readings from Last 3 Encounters:  04/05/22 128/84  12/17/21 120/82  12/06/21 (!) 152/84   She does workout. She denies chest pain, shortness of breath, dizziness.   She is on cholesterol medication, Rosuvastatin 5 mg,  and denies myalgias. Her cholesterol is at goal. The cholesterol last visit was:  Lab Results  Component Value Date   CHOL 165 12/17/2021   HDL 66 12/17/2021   LDLCALC 72 12/17/2021   TRIG 204 (H) 12/17/2021   CHOLHDL 2.5 12/17/2021  . She has been working on diet and exercise for prediabetes, she is on bASA, she is on ACE/ARB and denies foot ulcerations, hyperglycemia, hypoglycemia , increased appetite, nausea, paresthesia of the feet, polydipsia, polyuria, visual disturbances, vomiting and weight loss. Last A1C in the office was:  Lab Results  Component Value Date   HGBA1C 6.0 (H) 12/17/2021   Patient is on Vitamin D supplement, she is on 5000 daily.   Lab Results  Component Value Date   VD25OH 44 03/17/2021   She continues to use Omeprazole for GERD. Symptoms are well controlled      Current Medications:   Current Outpatient Medications (Endocrine & Metabolic):    norethindrone (MICRONOR) 0.35 MG tablet, Take 1 tablet (0.35 mg total) by mouth daily.   dexamethasone (DECADRON) 1 MG tablet, Take 3 tabs for 3 days, 2 tabs for 3 days 1 tab for 5 days. Take with food. (Patient not taking: Reported on 04/05/2022)  Current Outpatient Medications (Cardiovascular):    hydrochlorothiazide (HYDRODIURIL) 12.5 MG tablet, Take 2 tablets (25 mg total) by mouth daily.   rosuvastatin (CRESTOR) 5 MG tablet, TAKE 1 TABLET BY MOUTH EVERY DAY FOR CHOLESTEROL   telmisartan (MICARDIS) 40 MG  tablet, TAKE 1 TABLET BY MOUTH EVERY DAY FOR BLOOD PRESSURE  Current Outpatient Medications (Respiratory):    albuterol (VENTOLIN HFA) 108 (90 Base) MCG/ACT inhaler, INHALE 2 PUFFS INTO THE LUNGS EVERY 4 HOURS AS NEEDED FOR WHEEZING OR SHORTNESS OF BREATH.   cetirizine (ZYRTEC) 10 MG tablet, Take 10 mg by mouth daily.   fexofenadine (ALLEGRA) 180 MG tablet, Take 180 mg by mouth daily as needed for allergies or rhinitis. Alternates with zyrtec when Zyrtec seems to not be as effective   levocetirizine (XYZAL) 5 MG tablet, Take 5 mg by mouth at bedtime.   montelukast (SINGULAIR) 10 MG tablet, TAKE 1 TABLET BY MOUTH DAILY FOR ALLERGIES   promethazine-dextromethorphan (PROMETHAZINE-DM) 6.25-15 MG/5ML syrup, Take 5 mLs by mouth 4 (four) times daily as needed for cough. (Patient not taking: Reported on 04/05/2022)  Current Outpatient Medications (Analgesics):    Acetaminophen (TYLENOL PO), Take by mouth.   aspirin 81 MG tablet, Take 81 mg by mouth daily.   meloxicam (MOBIC)  15 MG tablet, TAKE 1/2-1 TABLET DAILY WITH FOOD FOR PAIN, INFLAMMATION. TRY LIMIT TO 5 DAYS /WEEK TO AVOID KIDNEY DAMAGE   Current Outpatient Medications (Other):    baclofen (LIORESAL) 10 MG tablet, TAKE 1/2 TO 1 TABLET 1 OR 2 X /DAY AS NEEDED FOR MUSCLE SPASMS / TAKE BY MOUTH   Biotin 10 MG TABS, Take by mouth.   brexpiprazole (REXULTI) 1 MG TABS tablet, Take by mouth.   buPROPion (WELLBUTRIN XL) 300 MG 24 hr tablet, TAKE 1 TABLET EVERY MORNING FOR MOOD, FOCUS & CONCENTRATION   Cholecalciferol (VITAMIN D) 125 MCG (5000 UT) CAPS, Take 10,000 Units by mouth daily.   DULoxetine (CYMBALTA) 60 MG capsule, TAKE 1 CAPSULE BY MOUTH EVERY DAY   Eszopiclone 3 MG TABS, TAKE 1 IMMEDIATELY BEFORE BEDTIME   gabapentin (NEURONTIN) 300 MG capsule, TAKE 1 CAPSULE BY MOUTH THREE TIMES A DAY   Multiple Vitamin (MULTIVITAMIN) tablet, Take 1 tablet by mouth daily.   omeprazole (PRILOSEC) 40 MG capsule, TAKE 1 CAPSULE BY MOUTH EVERY DAY    Phendimetrazine Tartrate 35 MG TABS, 1-2 tabs twice a day as needed for appetite suppression   triamcinolone ointment (KENALOG) 0.1 %, Apply 1 application topically 2 (two) times daily. (Patient taking differently: Apply 1 application  topically daily as needed (rash).)   azithromycin (ZITHROMAX) 250 MG tablet, Take 2 tablets (500 mg) on  Day 1,  followed by 1 tablet (250 mg) once daily on Days 2 through 5. (Patient not taking: Reported on 04/05/2022)  Health Maintenance:   Immunization History  Administered Date(s) Administered   Influenza Inj Mdck Quad With Preservative 10/25/2016   Influenza, High Dose Seasonal PF 12/06/2021   Influenza, Seasonal, Injecte, Preservative Fre 01/01/2015   Influenza,inj,Quad PF,6+ Mos 10/25/2012, 01/20/2020, 11/16/2020   Influenza-Unspecified 06/05/2018, 12/06/2018   PFIZER(Purple Top)SARS-COV-2 Vaccination 05/03/2019, 05/29/2019   PPD Test 04/11/2013   Pneumococcal Polysaccharide-23 03/28/2012   Tdap 12/12/2012   Tetanus: 2014 Pneumovax: 2014 Flu vaccine: 2020 at [place]  Pap: Levar Dec 2019 MGM: 2017- will get  Last Dental Exam: Yearly visit PFTs unable to complete  Patient Care Team: Unk Pinto, MD as PCP - General (Internal Medicine) Lahoma Crocker, MD as Consulting Physician (Obstetrics and Gynecology) Daryll Brod, MD as Consulting Physician (Orthopedic Surgery) Unk Pinto, MD as Referring Physician (Internal Medicine)  Medical History:  Past Medical History:  Diagnosis Date   Anxiety    Arthritis    Bipolar disorder, unspecified ([place])    Depression    no meds   Diaphragmatic hernia with obstruction, without gangrene 11/07/2018   Dyspnea    doe with exertion occ   Family history of breast cancer    Family history of colon cancer    Family history of lung cancer    Family history of pancreatic cancer    Family history of prostate cancer    GERD (gastroesophageal reflux disease)    Hernia, diaphragmatic    History of  kidney stones    Hyperlipidemia    Hypertension    no meds   Pneumonia    1 time   Allergies Allergies  Allergen Reactions   Doxycycline Shortness Of Breath and Anxiety   Erythromycin Nausea Only and Other (See Comments)    Stomach cramping (can take Z Pak)   Flexeril [Cyclobenzaprine Hcl] Other (See Comments)    "don't like the way I feel"   Metformin And Related Other (See Comments)    GI Upset    SURGICAL HISTORY She  has  a past surgical history that includes Dilation and evacuation; Dilation and curettage of uterus; Video assisted thoracoscopy (vats)/thorocotomy (Right, 03/23/2018); epigastric hernia repair (N/A, 03/23/2018); IR THORACENTESIS ASP PLEURAL SPACE W/IMG GUIDE (04/19/2018); and Dilatation & curettage/hysteroscopy with myosure (N/A, 10/18/2019).   FAMILY HISTORY Her family history includes Atrial fibrillation in her maternal aunt; Breast cancer in her cousin and maternal aunt; Breast cancer (age of onset: 44) in her mother; Cervical cancer in her paternal aunt; Colon cancer in her maternal aunt, maternal aunt, maternal uncle, and maternal uncle; Colon polyps in her maternal aunt and mother; Congestive Heart Failure in her mother; Diabetes in her brother, maternal aunt, maternal uncle, and mother; Heart attack in her maternal aunt; Heart disease in her maternal aunt, maternal grandmother, and maternal uncle; Hemochromatosis in her paternal uncle; Hyperlipidemia in her brother, maternal aunt, maternal uncle, and mother; Hypertension in her brother, maternal aunt, maternal uncle, and mother; Kidney disease in her mother; Lung cancer in her mother; Ovarian cancer in her paternal aunt; Polycystic ovary syndrome in her paternal aunt; Prostate cancer in her cousin, cousin, and maternal uncle; Stroke in her maternal aunt, maternal aunt, and maternal uncle.   SOCIAL HISTORY She  reports that she has never smoked. She has never used smokeless tobacco. She reports current alcohol use. She  reports that she does not use drugs.  Review of Systems: Review of Systems  Constitutional:  Negative for chills, fever and malaise/fatigue.  HENT:  Negative for congestion, ear pain and sore throat.   Eyes: Negative.   Respiratory:  Negative for cough, shortness of breath and wheezing.   Cardiovascular:  Negative for chest pain, palpitations and leg swelling.  Gastrointestinal:  Negative for abdominal pain, blood in stool, constipation, diarrhea, heartburn and melena.  Genitourinary:  Negative for dysuria, frequency, hematuria and urgency.  Neurological:  Negative for dizziness, loss of consciousness and headaches.  Psychiatric/Behavioral:  Negative for depression. The patient is not nervous/anxious and does not have insomnia.     Physical Exam: Estimated body mass index is 46.48 kg/m as calculated from the following:   Height as of this encounter: '5\' 1"'$  (1.549 m).   Weight as of this encounter: 246 lb (111.6 kg). BP 128/84   Pulse (!) 111   Temp (!) 97.5 F (36.4 C)   Ht '5\' 1"'$  (1.549 m)   Wt 246 lb (111.6 kg)   LMP 04/03/2022   SpO2 98%   BMI 46.48 kg/m   General Appearance: Morbidly obese pleasant female, in mild pain from shingles.  Eyes: PERRLA, EOMs, conjunctiva no swelling or erythema ENT/Mouth: Ear canals normal without obstruction, swelling, erythema, or discharge.  TMs normal bilaterally with no erythema, bulging, retraction, or loss of landmark.  Oropharynx moist and clear with no exudate, erythema, or swelling.  Neck: Supple, thyroid normal. No bruits.  No cervical adenopathy Respiratory: Respiratory effort normal, Breath sounds clear A&P without wheeze, rhonchi, rales.   Cardio: RRR without murmurs, rubs or gallops. Brisk peripheral pulses without edema.  Chest: symmetric, with normal excursions Breasts: Symmetric, without lumps, nipple discharge, retractions.  Abdomen: Soft, nontender, no guarding, rebound, hernias, masses, or organomegaly.  Lymphatics: Non  tender without lymphadenopathy.  Musculoskeletal: Full ROM all peripheral extremities,5/5 strength, and normal gait.  Skin: Warm, dry . Shingles rash starts at sternum and goes under left breast and around back to spine- rash is drying out, very painful Neuro: Awake and oriented X 3, Cranial nerves intact, reflexes equal bilaterally. Normal muscle tone, no cerebellar symptoms. Neuralgia  from shingles on left Psych:  normal affect, Insight and Judgment appropriate.   EKG: defer to next visit due to shingles   Samantha Rossetti, NP 2:12 PM [facility]

## 2022-04-05 ENCOUNTER — Ambulatory Visit (INDEPENDENT_AMBULATORY_CARE_PROVIDER_SITE_OTHER): Payer: BC Managed Care – PPO | Admitting: Nurse Practitioner

## 2022-04-05 ENCOUNTER — Encounter: Payer: Self-pay | Admitting: Nurse Practitioner

## 2022-04-05 VITALS — BP 128/84 | HR 111 | Temp 97.5°F | Ht 61.0 in | Wt 246.0 lb

## 2022-04-05 DIAGNOSIS — B0223 Postherpetic polyneuropathy: Secondary | ICD-10-CM

## 2022-04-05 DIAGNOSIS — F330 Major depressive disorder, recurrent, mild: Secondary | ICD-10-CM

## 2022-04-05 DIAGNOSIS — Z1389 Encounter for screening for other disorder: Secondary | ICD-10-CM

## 2022-04-05 DIAGNOSIS — Z131 Encounter for screening for diabetes mellitus: Secondary | ICD-10-CM | POA: Diagnosis not present

## 2022-04-05 DIAGNOSIS — Z13220 Encounter for screening for lipoid disorders: Secondary | ICD-10-CM | POA: Diagnosis not present

## 2022-04-05 DIAGNOSIS — R7309 Other abnormal glucose: Secondary | ICD-10-CM

## 2022-04-05 DIAGNOSIS — Z0000 Encounter for general adult medical examination without abnormal findings: Secondary | ICD-10-CM

## 2022-04-05 DIAGNOSIS — L404 Guttate psoriasis: Secondary | ICD-10-CM

## 2022-04-05 DIAGNOSIS — E559 Vitamin D deficiency, unspecified: Secondary | ICD-10-CM

## 2022-04-05 DIAGNOSIS — R7989 Other specified abnormal findings of blood chemistry: Secondary | ICD-10-CM

## 2022-04-05 DIAGNOSIS — E782 Mixed hyperlipidemia: Secondary | ICD-10-CM

## 2022-04-05 DIAGNOSIS — K219 Gastro-esophageal reflux disease without esophagitis: Secondary | ICD-10-CM

## 2022-04-05 DIAGNOSIS — Z1329 Encounter for screening for other suspected endocrine disorder: Secondary | ICD-10-CM

## 2022-04-05 DIAGNOSIS — G4700 Insomnia, unspecified: Secondary | ICD-10-CM

## 2022-04-05 DIAGNOSIS — Z136 Encounter for screening for cardiovascular disorders: Secondary | ICD-10-CM

## 2022-04-05 DIAGNOSIS — Z79899 Other long term (current) drug therapy: Secondary | ICD-10-CM | POA: Diagnosis not present

## 2022-04-05 DIAGNOSIS — Z0001 Encounter for general adult medical examination with abnormal findings: Secondary | ICD-10-CM

## 2022-04-05 DIAGNOSIS — G4733 Obstructive sleep apnea (adult) (pediatric): Secondary | ICD-10-CM

## 2022-04-05 DIAGNOSIS — I10 Essential (primary) hypertension: Secondary | ICD-10-CM

## 2022-04-05 MED ORDER — GABAPENTIN 300 MG PO CAPS: ORAL_CAPSULE | ORAL | 2 refills | Status: DC

## 2022-04-05 NOTE — Patient Instructions (Signed)

## 2022-04-06 ENCOUNTER — Encounter: Payer: Self-pay | Admitting: Nurse Practitioner

## 2022-04-06 LAB — MAGNESIUM: Magnesium: 2.1 mg/dL (ref 1.5–2.5)

## 2022-04-06 LAB — CBC WITH DIFFERENTIAL/PLATELET
Absolute Monocytes: 702 cells/uL (ref 200–950)
Basophils Absolute: 39 cells/uL (ref 0–200)
Basophils Relative: 0.5 %
Eosinophils Absolute: 133 cells/uL (ref 15–500)
Eosinophils Relative: 1.7 %
HCT: 39.3 % (ref 35.0–45.0)
Hemoglobin: 13.4 g/dL (ref 11.7–15.5)
Lymphs Abs: 2215 cells/uL (ref 850–3900)
MCH: 32.3 pg (ref 27.0–33.0)
MCHC: 34.1 g/dL (ref 32.0–36.0)
MCV: 94.7 fL (ref 80.0–100.0)
MPV: 9.5 fL (ref 7.5–12.5)
Monocytes Relative: 9 %
Neutro Abs: 4711 cells/uL (ref 1500–7800)
Neutrophils Relative %: 60.4 %
Platelets: 318 10*3/uL (ref 140–400)
RBC: 4.15 10*6/uL (ref 3.80–5.10)
RDW: 12 % (ref 11.0–15.0)
Total Lymphocyte: 28.4 %
WBC: 7.8 10*3/uL (ref 3.8–10.8)

## 2022-04-06 LAB — MICROALBUMIN / CREATININE URINE RATIO
Creatinine, Urine: 16 mg/dL — ABNORMAL LOW (ref 20–275)
Microalb, Ur: <0.2 mg/dL

## 2022-04-06 LAB — COMPLETE METABOLIC PANEL WITH GFR
AG Ratio: 1.6 (calc) (ref 1.0–2.5)
ALT: 53 U/L — ABNORMAL HIGH (ref 6–29)
AST: 31 U/L (ref 10–35)
Albumin: 4.3 g/dL (ref 3.6–5.1)
Alkaline phosphatase (APISO): 72 U/L (ref 37–153)
BUN: 9 mg/dL (ref 7–25)
CO2: 25 mmol/L (ref 20–32)
Calcium: 9.6 mg/dL (ref 8.6–10.4)
Chloride: 102 mmol/L (ref 98–110)
Creat: 0.92 mg/dL (ref 0.50–1.03)
Globulin: 2.7 g/dL (calc) (ref 1.9–3.7)
Glucose, Bld: 112 mg/dL — ABNORMAL HIGH (ref 65–99)
Potassium: 4 mmol/L (ref 3.5–5.3)
Sodium: 139 mmol/L (ref 135–146)
Total Bilirubin: 0.5 mg/dL (ref 0.2–1.2)
Total Protein: 7 g/dL (ref 6.1–8.1)
eGFR: 75 mL/min/{1.73_m2} (ref >=60)

## 2022-04-06 LAB — URINALYSIS, ROUTINE W REFLEX MICROSCOPIC
Bilirubin Urine: NEGATIVE
Glucose, UA: NEGATIVE
Hgb urine dipstick: NEGATIVE
Ketones, ur: NEGATIVE
Leukocytes,Ua: NEGATIVE
Nitrite: NEGATIVE
Protein, ur: NEGATIVE
Specific Gravity, Urine: 1.002 (ref 1.001–1.035)
pH: 6 (ref 5.0–8.0)

## 2022-04-06 LAB — LIPID PANEL
Cholesterol: 193 mg/dL (ref <200)
HDL: 68 mg/dL (ref >=50)
LDL Cholesterol (Calc): 92 mg/dL (calc)
Non-HDL Cholesterol (Calc): 125 mg/dL (calc) (ref <130)
Total CHOL/HDL Ratio: 2.8 (calc) (ref <5.0)
Triglycerides: 237 mg/dL — ABNORMAL HIGH (ref <150)

## 2022-04-06 LAB — HEMOGLOBIN A1C
Hgb A1c MFr Bld: 6.3 % of total Hgb — ABNORMAL HIGH (ref <5.7)
Mean Plasma Glucose: 134 mg/dL
eAG (mmol/L): 7.4 mmol/L

## 2022-04-06 LAB — TSH: TSH: 3.33 mIU/L

## 2022-04-06 LAB — VITAMIN D 25 HYDROXY (VIT D DEFICIENCY, FRACTURES): Vit D, 25-Hydroxy: 41 ng/mL (ref 30–100)

## 2022-04-15 DIAGNOSIS — G4733 Obstructive sleep apnea (adult) (pediatric): Secondary | ICD-10-CM | POA: Diagnosis not present

## 2022-04-21 DIAGNOSIS — G4733 Obstructive sleep apnea (adult) (pediatric): Secondary | ICD-10-CM | POA: Diagnosis not present

## 2022-05-15 ENCOUNTER — Encounter: Payer: Self-pay | Admitting: Nurse Practitioner

## 2022-05-16 ENCOUNTER — Other Ambulatory Visit: Payer: Self-pay | Admitting: Nurse Practitioner

## 2022-05-16 DIAGNOSIS — I10 Essential (primary) hypertension: Secondary | ICD-10-CM

## 2022-05-16 DIAGNOSIS — G4733 Obstructive sleep apnea (adult) (pediatric): Secondary | ICD-10-CM | POA: Diagnosis not present

## 2022-05-16 MED ORDER — HYDROCHLOROTHIAZIDE 12.5 MG PO TABS: 25.0000 mg | ORAL_TABLET | Freq: Every day | ORAL | 3 refills | Status: DC

## 2022-05-16 MED ORDER — TELMISARTAN 40 MG PO TABS: ORAL_TABLET | ORAL | 3 refills | Status: DC

## 2022-05-18 DIAGNOSIS — F419 Anxiety disorder, unspecified: Secondary | ICD-10-CM | POA: Diagnosis not present

## 2022-05-18 DIAGNOSIS — F3341 Major depressive disorder, recurrent, in partial remission: Secondary | ICD-10-CM | POA: Diagnosis not present

## 2022-05-20 ENCOUNTER — Ambulatory Visit: Payer: BC Managed Care – PPO | Admitting: Obstetrics & Gynecology

## 2022-05-22 DIAGNOSIS — G4733 Obstructive sleep apnea (adult) (pediatric): Secondary | ICD-10-CM | POA: Diagnosis not present

## 2022-05-23 ENCOUNTER — Ambulatory Visit: Payer: BC Managed Care – PPO | Admitting: Nurse Practitioner

## 2022-05-23 ENCOUNTER — Encounter: Payer: Self-pay | Admitting: Nurse Practitioner

## 2022-05-23 VITALS — BP 138/86 | HR 85 | Temp 97.7°F | Ht 61.0 in | Wt 239.4 lb

## 2022-05-23 DIAGNOSIS — J111 Influenza due to unidentified influenza virus with other respiratory manifestations: Secondary | ICD-10-CM | POA: Diagnosis not present

## 2022-05-23 DIAGNOSIS — Z1152 Encounter for screening for COVID-19: Secondary | ICD-10-CM | POA: Diagnosis not present

## 2022-05-23 DIAGNOSIS — R6889 Other general symptoms and signs: Secondary | ICD-10-CM | POA: Diagnosis not present

## 2022-05-23 DIAGNOSIS — I10 Essential (primary) hypertension: Secondary | ICD-10-CM

## 2022-05-23 LAB — POCT INFLUENZA A/B
Influenza A, POC: POSITIVE — AB
Influenza B, POC: NEGATIVE

## 2022-05-23 LAB — POC COVID19 BINAXNOW: SARS Coronavirus 2 Ag: NEGATIVE

## 2022-05-23 MED ORDER — DEXAMETHASONE 4 MG PO TABS
ORAL_TABLET | ORAL | 0 refills | Status: DC
Start: 2022-05-23 — End: 2023-01-03

## 2022-05-23 MED ORDER — PROMETHAZINE-DM 6.25-15 MG/5ML PO SYRP
5.0000 mL | ORAL_SOLUTION | Freq: Four times a day (QID) | ORAL | 1 refills | Status: DC | PRN
Start: 2022-05-23 — End: 2023-01-03

## 2022-05-23 NOTE — Progress Notes (Signed)
Assessment and Plan:  Samantha Greene was seen today for acute visit.  Diagnoses and all orders for this visit:  Encounter for screening for COVID-19 -     POC COVID-19- negative  Flu-like symptoms -     POCT Influenza A/B- positive flu A  Essential hypertension - continue medications, DASH diet, exercise and monitor at home. Call if greater than 130/80.   Flu Push fluids, continue mucinex and Tylenol If no improvement in symptoms by Friday, notify the office -     promethazine-dextromethorphan (PROMETHAZINE-DM) 6.25-15 MG/5ML syrup; Take 5 mLs by mouth 4 (four) times daily as needed for cough. -     dexamethasone (DECADRON) 4 MG tablet; Take 3 tabs for 3 days, 2 tabs for 3 days 1 tab for 5 days. Take with food.       Further disposition pending results of labs. Discussed med's effects and SE's.   Over 30 minutes of exam, counseling, chart review, and critical decision making was performed.   Future Appointments  Date Time Provider Department Center  07/19/2022  2:30 PM [name], NP [facility] None  12/08/2022  2:45 PM [name], NP [facility] None  03/20/2023 10:00 AM [name], NP [facility] None    ------------------------------------------------------------------------------------------------------------------   HPI BP 138/86   Pulse 85   Temp 97.7 F (36.5 C)   Ht  (1.549 m)   Wt 239 lb 6.4 oz (108.6 kg)   LMP 04/20/2022   SpO2 94%   BMI 45.23 kg/m   52 y.o.female presents for complaints of cough of light green sputum.  Fever 101.8- using tylenol to control. Body aches, sinus pressure, vomiting and nausea x 1 day. Symptoms started 3 days ago Has been taking Zyrtec, singulair, tylenol and Mucinex. Positive for Flu A in office. She was vaccinated 12/2021 for Flu . She has not been on a trip and does not know anyone who has the flu   BP is well controlled with telmisartan 40 mg and HCTZ  12.5 mg 2 tablets daily BP Readings from Last 3 Encounters:   05/23/22 138/86  04/05/22 128/84  12/17/21 120/82     Past Medical History:  Diagnosis Date   Anxiety    Arthritis    Bipolar disorder, unspecified    Depression    no meds   Diaphragmatic hernia with obstruction, without gangrene 11/07/2018   Dyspnea    doe with exertion occ   Family history of breast cancer    Family history of colon cancer    Family history of lung cancer    Family history of pancreatic cancer    Family history of prostate cancer    GERD (gastroesophageal reflux disease)    Hernia, diaphragmatic    History of kidney stones    Hyperlipidemia    Hypertension    no meds   Pneumonia    1 time     Allergies  Allergen Reactions   Doxycycline Shortness Of Breath and Anxiety   Erythromycin Nausea Only and Other (See Comments)    Stomach cramping (can take Z Pak)   Flexeril [Cyclobenzaprine Hcl] Other (See Comments)    "don't like the way I feel"   Metformin And Related Other (See Comments)    GI Upset    Current Outpatient Medications on File Prior to Visit  Medication Sig   Acetaminophen (TYLENOL PO) Take by mouth.   albuterol (VENTOLIN HFA) 108 (90 Base) MCG/ACT inhaler INHALE 2 PUFFS INTO THE LUNGS EVERY 4 HOURS  AS NEEDED FOR WHEEZING OR SHORTNESS OF BREATH.   aspirin 81 MG tablet Take 81 mg by mouth daily.   baclofen (LIORESAL) 10 MG tablet TAKE 1/2 TO 1 TABLET 1 OR 2 X /DAY AS NEEDED FOR MUSCLE SPASMS / TAKE BY MOUTH   Biotin 10 MG TABS Take by mouth.   brexpiprazole (REXULTI) 1 MG TABS tablet Take by mouth.   buPROPion (WELLBUTRIN XL) 300 MG 24 hr tablet TAKE 1 TABLET EVERY MORNING FOR MOOD, FOCUS & CONCENTRATION   cetirizine (ZYRTEC) 10 MG tablet Take 10 mg by mouth daily.   Cholecalciferol (VITAMIN D) 125 MCG (5000 UT) CAPS Take 10,000 Units by mouth daily.   DULoxetine (CYMBALTA) 60 MG capsule TAKE 1 CAPSULE BY MOUTH EVERY DAY   Eszopiclone 3 MG TABS TAKE 1 IMMEDIATELY BEFORE BEDTIME   fexofenadine (ALLEGRA) 180 MG tablet Take 180 mg by  mouth daily as needed for allergies or rhinitis. Alternates with zyrtec when Zyrtec seems to not be as effective   gabapentin (NEURONTIN) 300 MG capsule TAKE 1 CAPSULE BY MOUTH THREE TIMES A DAY   guaiFENesin (MUCINEX PO) Take by mouth.   hydrochlorothiazide (HYDRODIURIL) 12.5 MG tablet Take 2 tablets (25 mg total) by mouth daily.   levocetirizine (XYZAL) 5 MG tablet Take 5 mg by mouth at bedtime.   meloxicam (MOBIC) 15 MG tablet TAKE 1/2-1 TABLET DAILY WITH FOOD FOR PAIN, INFLAMMATION. TRY LIMIT TO 5 DAYS /WEEK TO AVOID KIDNEY DAMAGE   montelukast (SINGULAIR) 10 MG tablet TAKE 1 TABLET BY MOUTH DAILY FOR ALLERGIES   Multiple Vitamin (MULTIVITAMIN) tablet Take 1 tablet by mouth daily.   norethindrone (MICRONOR) 0.35 MG tablet Take 1 tablet (0.35 mg total) by mouth daily.   omeprazole (PRILOSEC) 40 MG capsule TAKE 1 CAPSULE BY MOUTH EVERY DAY   Phendimetrazine Tartrate 35 MG TABS 1-2 tabs twice a day as needed for appetite suppression   rosuvastatin (CRESTOR) 5 MG tablet TAKE 1 TABLET BY MOUTH EVERY DAY FOR CHOLESTEROL   telmisartan (MICARDIS) 40 MG tablet TAKE 1 TABLET BY MOUTH EVERY DAY FOR BLOOD PRESSURE   triamcinolone ointment (KENALOG) 0.1 % Apply 1 application topically 2 (two) times daily. (Patient taking differently: Apply 1 application  topically daily as needed (rash).)   No current facility-administered medications on file prior to visit.    ROS: all negative except above.   Physical Exam:  BP 138/86   Pulse 85   Temp 97.7 F (36.5 C)   Ht  (1.549 m)   Wt 239 lb 6.4 oz (108.6 kg)   LMP 04/20/2022   SpO2 94%   BMI 45.23 kg/m   General Appearance: Well nourished, appears ill Eyes: PERRLA, EOMs, conjunctiva no swelling or erythema Sinuses: No Frontal/maxillary tenderness ENT/Mouth: Ext aud canals clear, TMs without erythema, bulging. No erythema, swelling, or exudate on post pharynx.Marland Kitchen Hearing normal.  Neck: Supple, thyroid normal.  Respiratory: Respiratory effort  normal, BS equal bilaterally without rales, rhonchi, wheezing or stridor.  Cardio: RRR with no MRGs. Brisk peripheral pulses without edema.  Abdomen: Soft, + BS.  Non tender, no guarding, rebound, hernias, masses. Lymphatics: Non tender without lymphadenopathy.  Musculoskeletal: Full ROM, 5/5 strength, normal gait.  Skin: Warm, dry without rashes, lesions, ecchymosis.  Neuro: Cranial nerves intact. Normal muscle tone, no cerebellar symptoms. Sensation intact.  Psych: Awake and oriented X 3, normal affect, Insight and Judgment appropriate.     [name], NP 4:18 PM [facility]

## 2022-05-23 NOTE — Patient Instructions (Signed)

## 2022-05-27 ENCOUNTER — Other Ambulatory Visit: Payer: Self-pay | Admitting: Nurse Practitioner

## 2022-05-27 DIAGNOSIS — I10 Essential (primary) hypertension: Secondary | ICD-10-CM

## 2022-06-21 ENCOUNTER — Other Ambulatory Visit: Payer: Self-pay | Admitting: Nurse Practitioner

## 2022-06-21 DIAGNOSIS — G4700 Insomnia, unspecified: Secondary | ICD-10-CM

## 2022-06-21 DIAGNOSIS — G4733 Obstructive sleep apnea (adult) (pediatric): Secondary | ICD-10-CM | POA: Diagnosis not present

## 2022-06-21 MED ORDER — ESZOPICLONE 3 MG PO TABS
ORAL_TABLET | ORAL | 0 refills | Status: DC
Start: 2022-06-21 — End: 2022-09-17

## 2022-07-11 ENCOUNTER — Encounter: Payer: Self-pay | Admitting: Nurse Practitioner

## 2022-07-18 ENCOUNTER — Other Ambulatory Visit: Payer: Self-pay

## 2022-07-18 DIAGNOSIS — K219 Gastro-esophageal reflux disease without esophagitis: Secondary | ICD-10-CM

## 2022-07-18 MED ORDER — OMEPRAZOLE 40 MG PO CPDR
40.0000 mg | DELAYED_RELEASE_CAPSULE | Freq: Every day | ORAL | 0 refills | Status: DC
Start: 2022-07-18 — End: 2022-10-14

## 2022-07-18 NOTE — Progress Notes (Deleted)
3 MONTH FOLLOW UP   Assessment and Plan:   Samantha Greene was seen today for follow-up.  Diagnoses and all orders for this visit:  Mild episode of recurrent major depressive disorder (HCC)  Continue to follow with [name] PA-C [facility]  Continue current medications  Monitor symptoms  If any thoughts of hurting self or others she is to go to the ER immediately.   Essential hypertension -     CBC with Differential/Platelet -  continue medications: Telmisartan 40 mg and HCTZ 12.5 mg QD, DASH diet, exercise and monitor at home. Call if greater than 130/80.   - Go to the ER if any chest pain, shortness of breath, nausea, dizziness, severe HA, changes vision/speech  - CBC  Mixed hyperlipidemia -     COMPLETE METABOLIC PANEL WITH GFR -     Lipid panel - Continue Rosuvastatin 5 mg, diet and exercise  Gastroesophageal reflux disease without esophagitis -     Magnesium - Continue Prilosec and behavior modifications  Morbid obesity (HCC). - Increase exercise, start focusing on limiting processed carbs and saturated fats  Vitamin D deficiency Continue Vit D supplementation to maintain value in therapeutic level of 60-100   Medication management  Continued  Abnormal glucose  Continue to work on diet and exercise  A1c  Insomnia Continue practicing good sleep hygiene Continue Eszopiclone, avoid nightly use  Obesity Long discussion about weight loss, diet, and exercise Recommended diet heavy in fruits and veggies and low in animal meats, cheeses, and dairy products, appropriate calorie intake Patient will work on decreasing saturated fats and simple carbs, increase protein Increase Phendimetrazine to 2 tabs BID and monitor for side effects, chest pain, shortness of breath, palpitations Follow up at next visit    Further disposition pending results if labs check today. Discussed med's effects and SE's.   Over 30 minutes of face to face interview,  exam, counseling, chart review, and critical decision making was performed.   Discussed med's effects and SE's. Screening labs and tests as requested with regular follow-up as recommended. Future Appointments  Date Time Provider Department Center  07/19/2022  2:30 PM [name], NP [facility] None  12/08/2022  2:45 PM [name], NP [facility] None  03/20/2023 10:00 AM [name], NP [facility] None    HPI  52 y.o. Greene  presents for 6 month follow up of HTN, Hyperlipidemia, Obesity, Depression, Abnormal glucose, GERD  She has OSA on CPAP.  She is wearing it regularly.   Her heartburn is controlled with Omeprazole unless dietary choices are poor.   BMI is There is no height or weight on file to calculate BMI., she is working on diet and exercise. She is not currently exercising and has not been focusing on her diet. She just got back from a 3 week vacation. She has met with a nutritionist. Was started on Phendimetrazine and is currently on 1 pill a day but does not seem to be working to help control appetite Medication is no Wt Readings from Last 3 Encounters:  05/23/22 239 lb 6.4 oz (108.6 kg)  04/05/22 246 lb (111.6 kg)  12/17/21 243 lb 6.4 oz (110.4 kg)   Her blood pressure has been controlled at home, currently on Telmisartan 40mg  QD and HCTZ 12.5 mg QD, today their BP is     BP Readings from Last 3 Encounters:  05/23/22 138/86  04/05/22 128/84  12/17/21 120/82  She does workout. She denies chest pain, shortness  of breath, dizziness.   She is on cholesterol medication, Rosuvastatin 5 mg,  and denies myalgias. Her cholesterol is at goal. The cholesterol last visit was:  Lab Results  Component Value Date   CHOL 193 04/05/2022   HDL 68 04/05/2022   LDLCALC 92 04/05/2022   TRIG 237 (H) 04/05/2022   CHOLHDL 2.8 04/05/2022  . She has been working on diet and exercise for prediabetes, she is on bASA, she is on ACE/ARB and denies foot ulcerations, hyperglycemia,  hypoglycemia , increased appetite, nausea, paresthesia of the feet, polydipsia, polyuria, visual disturbances, vomiting and weight loss. Last A1C in the office was:  Lab Results  Component Value Date   HGBA1C 6.3 (H) 04/05/2022   Patient is on Vitamin D supplement, she is on 5000 daily.   Lab Results  Component Value Date   VD25OH 41 04/05/2022     She is currently on Wellbutrin 300mg  QD and Cymbalta 60mg  QD. Also using Rexulti 1 mg. Mood is stable. She follows with  [name] PA-C with [facility]    Current Medications:   Current Outpatient Medications (Endocrine & Metabolic):    dexamethasone (DECADRON) 4 MG tablet, Take 3 tabs for 3 days, 2 tabs for 3 days 1 tab for 5 days. Take with food.   norethindrone (MICRONOR) 0.35 MG tablet, Take 1 tablet (0.35 mg total) by mouth daily.  Current Outpatient Medications (Cardiovascular):    hydrochlorothiazide (HYDRODIURIL) 12.5 MG tablet, TAKE 2 TABLETS BY MOUTH DAILY.   rosuvastatin (CRESTOR) 5 MG tablet, TAKE 1 TABLET BY MOUTH EVERY DAY FOR CHOLESTEROL   telmisartan (MICARDIS) 40 MG tablet, TAKE 1 TABLET BY MOUTH EVERY DAY FOR BLOOD PRESSURE  Current Outpatient Medications (Respiratory):    albuterol (VENTOLIN HFA) 108 (90 Base) MCG/ACT inhaler, INHALE 2 PUFFS INTO THE LUNGS EVERY 4 HOURS AS NEEDED FOR WHEEZING OR SHORTNESS OF BREATH.   cetirizine (ZYRTEC) 10 MG tablet, Take 10 mg by mouth daily.   fexofenadine (ALLEGRA) 180 MG tablet, Take 180 mg by mouth daily as needed for allergies or rhinitis. Alternates with zyrtec when Zyrtec seems to not be as effective   guaiFENesin (MUCINEX PO), Take by mouth.   levocetirizine (XYZAL) 5 MG tablet, Take 5 mg by mouth at bedtime.   montelukast (SINGULAIR) 10 MG tablet, TAKE 1 TABLET BY MOUTH DAILY FOR ALLERGIES   promethazine-dextromethorphan (PROMETHAZINE-DM) 6.25-15 MG/5ML syrup, Take 5 mLs by mouth 4 (four) times daily as needed for cough.  Current Outpatient Medications (Analgesics):     Acetaminophen (TYLENOL PO), Take by mouth.   aspirin 81 MG tablet, Take 81 mg by mouth daily.   meloxicam (MOBIC) 15 MG tablet, TAKE 1/2-1 TABLET DAILY WITH FOOD FOR PAIN, INFLAMMATION. TRY LIMIT TO 5 DAYS /WEEK TO AVOID KIDNEY DAMAGE   Current Outpatient Medications (Other):    baclofen (LIORESAL) 10 MG tablet, TAKE 1/2 TO 1 TABLET 1 OR 2 X /DAY AS NEEDED FOR MUSCLE SPASMS / TAKE BY MOUTH   Biotin 10 MG TABS, Take by mouth.   brexpiprazole (REXULTI) 1 MG TABS tablet, Take by mouth.   buPROPion (WELLBUTRIN XL) 300 MG 24 hr tablet, TAKE 1 TABLET EVERY MORNING FOR MOOD, FOCUS & CONCENTRATION   Cholecalciferol (VITAMIN D) 125 MCG (5000 UT) CAPS, Take 10,000 Units by mouth daily.   DULoxetine (CYMBALTA) 60 MG capsule, TAKE 1 CAPSULE BY MOUTH EVERY DAY   Eszopiclone 3 MG TABS, TAKE 1 IMMEDIATELY BEFORE BEDTIME   gabapentin (NEURONTIN) 300 MG capsule, TAKE 1 CAPSULE BY  MOUTH THREE TIMES A DAY   Multiple Vitamin (MULTIVITAMIN) tablet, Take 1 tablet by mouth daily.   omeprazole (PRILOSEC) 40 MG capsule, TAKE 1 CAPSULE BY MOUTH EVERY DAY   Phendimetrazine Tartrate 35 MG TABS, 1-2 tabs twice a day as needed for appetite suppression   triamcinolone ointment (KENALOG) 0.1 %, Apply 1 application topically 2 (two) times daily. (Patient taking differently: Apply 1 application  topically daily as needed (rash).)  Health Maintenance:   Immunization History  Administered Date(s) Administered   Influenza Inj Mdck Quad With Preservative 10/25/2016   Influenza, High Dose Seasonal PF 12/06/2021   Influenza, Seasonal, Injecte, Preservative Fre 01/01/2015   Influenza,inj,Quad PF,6+ Mos 10/25/2012, 01/20/2020, 11/16/2020   Influenza-Unspecified 06/05/2018, 12/06/2018   PFIZER(Purple Top)SARS-COV-2 Vaccination 05/03/2019, 05/29/2019   PPD Test 04/11/2013   Pneumococcal Polysaccharide-23 03/28/2012   Tdap 12/12/2012   Tetanus: 2014 Pneumovax: 2014 Flu vaccine: 2020 at [facility]  Pap: Lavoie, Dec 2019 MGM:  06/2019 Last Dental Exam: Yearly visit Colonoscopy: Referral placed today   Patient Care Team: Lucky Cowboy, MD as PCP - General (Internal Medicine) Antionette Char, MD as Consulting Physician (Obstetrics and Gynecology) Cindee Salt, MD as Consulting Physician (Orthopedic Surgery) Lucky Cowboy, MD as Referring Physician (Internal Medicine)  Medical History:  Past Medical History:  Diagnosis Date   Anxiety    Arthritis    Bipolar disorder, unspecified (HCC)    Depression    no meds   Diaphragmatic hernia with obstruction, without gangrene 11/07/2018   Dyspnea    doe with exertion occ   Family history of breast cancer    Family history of colon cancer    Family history of lung cancer    Family history of pancreatic cancer    Family history of prostate cancer    GERD (gastroesophageal reflux disease)    Hernia, diaphragmatic    History of kidney stones    Hyperlipidemia    Hypertension    no meds   Pneumonia    1 time   Allergies Allergies  Allergen Reactions   Doxycycline Shortness Of Breath and Anxiety   Erythromycin Nausea Only and Other (See Comments)    Stomach cramping (can take Z Pak)   Flexeril [Cyclobenzaprine Hcl] Other (See Comments)    "don't like the way I feel"   Metformin And Related Other (See Comments)    GI Upset    SURGICAL HISTORY She  has a past surgical history that includes Dilation and evacuation; Dilation and curettage of uterus; Video assisted thoracoscopy (vats)/thorocotomy (Right, 03/23/2018); epigastric hernia repair (N/A, 03/23/2018); IR THORACENTESIS ASP PLEURAL SPACE W/IMG GUIDE (04/19/2018); and Dilatation & curettage/hysteroscopy with myosure (N/A, 10/18/2019).   FAMILY HISTORY Her family history includes Atrial fibrillation in her maternal aunt; Breast cancer in her cousin and maternal aunt; Breast cancer (age of onset: 47) in her mother; Cervical cancer in her paternal aunt; Colon cancer in her maternal aunt, maternal aunt,  maternal uncle, and maternal uncle; Colon polyps in her maternal aunt and mother; Congestive Heart Failure in her mother; Diabetes in her brother, maternal aunt, maternal uncle, and mother; Heart attack in her maternal aunt; Heart disease in her maternal aunt, maternal grandmother, and maternal uncle; Hemochromatosis in her paternal uncle; Hyperlipidemia in her brother, maternal aunt, maternal uncle, and mother; Hypertension in her brother, maternal aunt, maternal uncle, and mother; Kidney disease in her mother; Lung cancer in her mother; Ovarian cancer in her paternal aunt; Polycystic ovary syndrome in her paternal aunt; Prostate cancer in her cousin,  cousin, and maternal uncle; Stroke in her maternal aunt, maternal aunt, and maternal uncle.   SOCIAL HISTORY She  reports that she has never smoked. She has never used smokeless tobacco. She reports current alcohol use. She reports that she does not use drugs.  Review of Systems: Review of Systems  Constitutional:  Negative for chills, fever and malaise/fatigue.       Weight gain  HENT:  Negative for congestion, ear pain and sore throat.   Eyes: Negative.   Respiratory:  Negative for cough, shortness of breath and wheezing.   Cardiovascular:  Negative for chest pain, palpitations and leg swelling.  Gastrointestinal:  Positive for heartburn. Negative for abdominal pain, blood in stool, constipation, diarrhea, melena, nausea and vomiting.  Genitourinary:  Negative for dysuria, frequency, hematuria and urgency.  Musculoskeletal:  Negative for back pain, joint pain, myalgias and neck pain.  Neurological:  Negative for dizziness, loss of consciousness and headaches.  Psychiatric/Behavioral:  Positive for depression. The patient is not nervous/anxious and does not have insomnia.     Physical Exam: Estimated body mass index is 45.23 kg/m as calculated from the following:   Height as of 05/23/22: 5\' 1"  (1.549 m).   Weight as of 05/23/22: 239 lb 6.4 oz  (108.6 kg). There were no vitals taken for this visit.  General Appearance: Obese pleasant Greene, in no apparent distress.  Eyes: PERRLA, EOMs, conjunctiva no swelling or erythema ENT/Mouth: Ear canals normal without obstruction, swelling, erythema, or discharge.  TMs normal bilaterally with no erythema, bulging, retraction, or loss of landmark.  Oropharynx moist and clear with no exudate, erythema, or swelling.  Neck: Supple, thyroid normal. No bruits.  No cervical adenopathy Respiratory: Respiratory effort normal, Breath sounds clear A&P without wheeze, rhonchi, rales.   Cardio: RRR without murmurs, rubs or gallops. Brisk peripheral pulses without edema.  Abdomen: Soft, nontender, no guarding, rebound, hernias, masses, or organomegaly.  Lymphatics: Non tender without lymphadenopathy.  Musculoskeletal: Full ROM all peripheral extremities,5/5 strength, and normal gait.  Skin: Warm, dry .  Neuro: Awake and oriented X 3, Cranial nerves intact, reflexes equal bilaterally. Normal muscle tone, no cerebellar symptoms.  Psych:  normal affect, Insight and Judgment appropriate.   Revonda Humphrey ANP-C  [facility]  07/18/2022

## 2022-07-19 ENCOUNTER — Ambulatory Visit: Payer: BC Managed Care – PPO | Admitting: Nurse Practitioner

## 2022-07-19 DIAGNOSIS — G4733 Obstructive sleep apnea (adult) (pediatric): Secondary | ICD-10-CM

## 2022-07-19 DIAGNOSIS — F330 Major depressive disorder, recurrent, mild: Secondary | ICD-10-CM

## 2022-07-19 DIAGNOSIS — K219 Gastro-esophageal reflux disease without esophagitis: Secondary | ICD-10-CM

## 2022-07-19 DIAGNOSIS — R7309 Other abnormal glucose: Secondary | ICD-10-CM

## 2022-07-19 DIAGNOSIS — I10 Essential (primary) hypertension: Secondary | ICD-10-CM

## 2022-07-19 DIAGNOSIS — G4700 Insomnia, unspecified: Secondary | ICD-10-CM

## 2022-07-19 DIAGNOSIS — E559 Vitamin D deficiency, unspecified: Secondary | ICD-10-CM

## 2022-07-19 DIAGNOSIS — Z79899 Other long term (current) drug therapy: Secondary | ICD-10-CM

## 2022-07-19 DIAGNOSIS — E782 Mixed hyperlipidemia: Secondary | ICD-10-CM

## 2022-07-20 ENCOUNTER — Ambulatory Visit: Payer: BC Managed Care – PPO | Admitting: Nurse Practitioner

## 2022-07-20 ENCOUNTER — Ambulatory Visit
Admission: RE | Admit: 2022-07-20 | Discharge: 2022-07-20 | Disposition: A | Discharge status: Home / Self Care | Payer: BC Managed Care – PPO | Source: Ambulatory Visit | Attending: Nurse Practitioner | Admitting: Nurse Practitioner

## 2022-07-20 ENCOUNTER — Encounter: Payer: Self-pay | Admitting: Nurse Practitioner

## 2022-07-20 VITALS — BP 134/80 | HR 90 | Temp 97.9°F | Resp 17 | Ht 61.0 in | Wt 246.8 lb

## 2022-07-20 DIAGNOSIS — R7309 Other abnormal glucose: Secondary | ICD-10-CM

## 2022-07-20 DIAGNOSIS — I10 Essential (primary) hypertension: Secondary | ICD-10-CM | POA: Diagnosis not present

## 2022-07-20 DIAGNOSIS — Z79899 Other long term (current) drug therapy: Secondary | ICD-10-CM

## 2022-07-20 DIAGNOSIS — K219 Gastro-esophageal reflux disease without esophagitis: Secondary | ICD-10-CM | POA: Diagnosis not present

## 2022-07-20 DIAGNOSIS — E782 Mixed hyperlipidemia: Secondary | ICD-10-CM | POA: Diagnosis not present

## 2022-07-20 DIAGNOSIS — F330 Major depressive disorder, recurrent, mild: Secondary | ICD-10-CM

## 2022-07-20 DIAGNOSIS — M545 Low back pain, unspecified: Secondary | ICD-10-CM

## 2022-07-20 DIAGNOSIS — E559 Vitamin D deficiency, unspecified: Secondary | ICD-10-CM

## 2022-07-20 DIAGNOSIS — G4700 Insomnia, unspecified: Secondary | ICD-10-CM

## 2022-07-20 LAB — COMPLETE METABOLIC PANEL WITH GFR
AG Ratio: 1.8 (calc) (ref 1.0–2.5)
ALT: 56 U/L — ABNORMAL HIGH (ref 6–29)
AST: 43 U/L — ABNORMAL HIGH (ref 10–35)
Albumin: 4.4 g/dL (ref 3.6–5.1)
Alkaline phosphatase (APISO): 67 U/L (ref 37–153)
BUN: 11 mg/dL (ref 7–25)
CO2: 27 mmol/L (ref 20–32)
Calcium: 9.6 mg/dL (ref 8.6–10.4)
Chloride: 100 mmol/L (ref 98–110)
Creat: 0.96 mg/dL (ref 0.50–1.03)
Globulin: 2.4 g/dL (calc) (ref 1.9–3.7)
Glucose, Bld: 98 mg/dL (ref 65–99)
Potassium: 4.4 mmol/L (ref 3.5–5.3)
Sodium: 136 mmol/L (ref 135–146)
Total Bilirubin: 0.5 mg/dL (ref 0.2–1.2)
Total Protein: 6.8 g/dL (ref 6.1–8.1)
eGFR: 71 mL/min/{1.73_m2} (ref >=60)

## 2022-07-20 LAB — LIPID PANEL
Cholesterol: 174 mg/dL (ref <200)
HDL: 68 mg/dL (ref >=50)
LDL Cholesterol (Calc): 77 mg/dL (calc)
Non-HDL Cholesterol (Calc): 106 mg/dL (calc) (ref <130)
Total CHOL/HDL Ratio: 2.6 (calc) (ref <5.0)
Triglycerides: 193 mg/dL — ABNORMAL HIGH (ref <150)

## 2022-07-20 LAB — CBC WITH DIFFERENTIAL/PLATELET
Absolute Monocytes: 562 cells/uL (ref 200–950)
Basophils Absolute: 32 cells/uL (ref 0–200)
Basophils Relative: 0.6 %
Eosinophils Absolute: 101 cells/uL (ref 15–500)
Eosinophils Relative: 1.9 %
HCT: 38.5 % (ref 35.0–45.0)
Hemoglobin: 13 g/dL (ref 11.7–15.5)
Lymphs Abs: 1913 cells/uL (ref 850–3900)
MCH: 32.7 pg (ref 27.0–33.0)
MCHC: 33.8 g/dL (ref 32.0–36.0)
MCV: 96.7 fL (ref 80.0–100.0)
MPV: 9.6 fL (ref 7.5–12.5)
Monocytes Relative: 10.6 %
Neutro Abs: 2692 cells/uL (ref 1500–7800)
Neutrophils Relative %: 50.8 %
Platelets: 251 10*3/uL (ref 140–400)
RBC: 3.98 10*6/uL (ref 3.80–5.10)
RDW: 12.4 % (ref 11.0–15.0)
Total Lymphocyte: 36.1 %
WBC: 5.3 10*3/uL (ref 3.8–10.8)

## 2022-07-20 LAB — MAGNESIUM: Magnesium: 1.9 mg/dL (ref 1.5–2.5)

## 2022-07-20 MED ORDER — TIZANIDINE HCL 6 MG PO CAPS
6.0000 mg | ORAL_CAPSULE | Freq: Three times a day (TID) | ORAL | 1 refills | Status: DC | PRN
Start: 2022-07-20 — End: 2022-10-04

## 2022-07-20 NOTE — Progress Notes (Signed)
3 MONTH FOLLOW UP   Assessment and Plan:   Samantha Greene was seen today for follow-up.  Diagnoses and all orders for this visit:  Mild episode of recurrent major depressive disorder (HCC)  Continue to follow with [name] PA-C [facility]  Continue current medications  Monitor symptoms  If any thoughts of hurting self or others she is to go to the ER immediately.   Essential hypertension -     CBC with Differential/Platelet -  continue medications: Telmisartan 40 mg and HCTZ 12.5 mg QD, DASH diet, exercise and monitor at home. Call if greater than 130/80.   - Go to the ER if any chest pain, shortness of breath, nausea, dizziness, severe HA, changes vision/speech  - CBC  Mixed hyperlipidemia -     COMPLETE METABOLIC PANEL WITH GFR -     Lipid panel - Continue Rosuvastatin 5 mg, diet and exercise  Gastroesophageal reflux disease without esophagitis -     Magnesium - Continue Prilosec and behavior modifications  Vitamin D deficiency Continue Vit D supplementation to maintain value in therapeutic level of 60-100   Abnormal glucose  Continue to work on diet and exercise  -CMP  Insomnia Continue practicing good sleep hygiene Continue Eszopiclone, avoid nightly use  Morbid Obesity(HCC) Long discussion about weight loss, diet, and exercise Recommended diet heavy in fruits and veggies and low in animal meats, cheeses, and dairy products, appropriate calorie intake Strongly encouraged to work on decreasing saturated fats and simple carbs, increase protein Follow up at next visit  Medication management -     CBC with Differential/Platelet -     COMPLETE METABOLIC PANEL WITH GFR -     Lipid panel -     Magnesium  Lumbar back pain Stop Baclofen and start Tizanidine 6 mg TID PRN Use heat and rest Will get lumbar xray depending on results will most likely refer to orthopedics for further evaluation If pain increases, gets numbness/tingling down leg or  inability to walk go to ER -     DG Lumbar Spine Complete; Future -     tizanidine (ZANAFLEX) 6 MG capsule; Take 1 capsule (6 mg total) by mouth 3 (three) times daily as needed for muscle spasms.      Further disposition pending results if labs check today. Discussed med's effects and SE's.   Over 30 minutes of face to face interview, exam, counseling, chart review, and critical decision making was performed.   Discussed med's effects and SE's. Screening labs and tests as requested with regular follow-up as recommended. Future Appointments  Date Time Provider Department Center  12/08/2022  2:45 PM [name], NP [facility] None  03/20/2023 10:00 AM [name], NP [facility] None    HPI  52 y.o. female  presents for 6 month follow up of HTN, Hyperlipidemia, Obesity, Depression, Abnormal glucose, GERD  She has been experiencing  low back pain on both sides x 1 week. Describes as muscle spasms and Baclofen is not helping. Describes as a constant aching pain, 5/10 on pain scale currently but can get to 10/10.  She has used Meloxicam and tylenol with no relief of pain.No radiation of pain down either leg  She has OSA on CPAP.  She is wearing it regularly. She is sleeping well.   Her heartburn is controlled with Omeprazole unless dietary choices are poor.   BMI is Body mass index is 46.63 kg/m., she is not  working on diet and exercise. She is  not using phendimetrazine.   Wt Readings from Last 3 Encounters:  07/20/22 246 lb 12.8 oz (111.9 kg)  05/23/22 239 lb 6.4 oz (108.6 kg)  04/05/22 246 lb (111.6 kg)   Her blood pressure has been controlled at home, currently on Telmisartan 40mg  QD and HCTZ 12.5 mg QD, today their BP is BP: 134/80   BP Readings from Last 3 Encounters:  07/20/22 134/80  05/23/22 138/86  04/05/22 128/84  She does workout. She denies chest pain, shortness of breath, dizziness.   She is on cholesterol medication, Rosuvastatin 5 mg,  and denies myalgias. Her  cholesterol is at goal. The cholesterol last visit was:  Lab Results  Component Value Date   CHOL 193 04/05/2022   HDL 68 04/05/2022   LDLCALC 92 04/05/2022   TRIG 237 (H) 04/05/2022   CHOLHDL 2.8 04/05/2022  . She has been working on diet and exercise for prediabetes, she is on bASA, she is on ACE/ARB and denies foot ulcerations, hyperglycemia, hypoglycemia , increased appetite, nausea, paresthesia of the feet, polydipsia, polyuria, visual disturbances, vomiting and weight loss. Last A1C in the office was:  Lab Results  Component Value Date   HGBA1C 6.3 (H) 04/05/2022   Patient is on Vitamin D supplement, she is on 5000 daily.   Lab Results  Component Value Date   VD25OH 41 04/05/2022     She is currently on Wellbutrin 300mg  QD and Cymbalta 60mg  QD. Also using Rexulti 1 mg. Mood is stable. She follows with  [name] PA-C with [facility]    Current Medications:   Current Outpatient Medications (Endocrine & Metabolic):    norethindrone (MICRONOR) 0.35 MG tablet, Take 1 tablet (0.35 mg total) by mouth daily.   dexamethasone (DECADRON) 4 MG tablet, Take 3 tabs for 3 days, 2 tabs for 3 days 1 tab for 5 days. Take with food.  Current Outpatient Medications (Cardiovascular):    hydrochlorothiazide (HYDRODIURIL) 12.5 MG tablet, TAKE 2 TABLETS BY MOUTH DAILY.   rosuvastatin (CRESTOR) 5 MG tablet, TAKE 1 TABLET BY MOUTH EVERY DAY FOR CHOLESTEROL   telmisartan (MICARDIS) 40 MG tablet, TAKE 1 TABLET BY MOUTH EVERY DAY FOR BLOOD PRESSURE  Current Outpatient Medications (Respiratory):    albuterol (VENTOLIN HFA) 108 (90 Base) MCG/ACT inhaler, INHALE 2 PUFFS INTO THE LUNGS EVERY 4 HOURS AS NEEDED FOR WHEEZING OR SHORTNESS OF BREATH.   cetirizine (ZYRTEC) 10 MG tablet, Take 10 mg by mouth daily.   fexofenadine (ALLEGRA) 180 MG tablet, Take 180 mg by mouth daily as needed for allergies or rhinitis. Alternates with zyrtec when Zyrtec seems to not be as effective   guaiFENesin (MUCINEX  PO), Take by mouth.   levocetirizine (XYZAL) 5 MG tablet, Take 5 mg by mouth at bedtime.   montelukast (SINGULAIR) 10 MG tablet, TAKE 1 TABLET BY MOUTH DAILY FOR ALLERGIES   promethazine-dextromethorphan (PROMETHAZINE-DM) 6.25-15 MG/5ML syrup, Take 5 mLs by mouth 4 (four) times daily as needed for cough.  Current Outpatient Medications (Analgesics):    Acetaminophen (TYLENOL PO), Take by mouth.   aspirin 81 MG tablet, Take 81 mg by mouth daily.   meloxicam (MOBIC) 15 MG tablet, TAKE 1/2-1 TABLET DAILY WITH FOOD FOR PAIN, INFLAMMATION. TRY LIMIT TO 5 DAYS /WEEK TO AVOID KIDNEY DAMAGE   Current Outpatient Medications (Other):    baclofen (LIORESAL) 10 MG tablet, TAKE 1/2 TO 1 TABLET 1 OR 2 X /DAY AS NEEDED FOR MUSCLE SPASMS / TAKE BY MOUTH   Biotin 10 MG TABS, Take  by mouth.   brexpiprazole (REXULTI) 1 MG TABS tablet, Take by mouth.   buPROPion (WELLBUTRIN XL) 300 MG 24 hr tablet, TAKE 1 TABLET EVERY MORNING FOR MOOD, FOCUS & CONCENTRATION   Cholecalciferol (VITAMIN D) 125 MCG (5000 UT) CAPS, Take 10,000 Units by mouth daily.   DULoxetine (CYMBALTA) 60 MG capsule, TAKE 1 CAPSULE BY MOUTH EVERY DAY   Eszopiclone 3 MG TABS, TAKE 1 IMMEDIATELY BEFORE BEDTIME   gabapentin (NEURONTIN) 300 MG capsule, TAKE 1 CAPSULE BY MOUTH THREE TIMES A DAY   Multiple Vitamin (MULTIVITAMIN) tablet, Take 1 tablet by mouth daily.   omeprazole (PRILOSEC) 40 MG capsule, Take 1 capsule (40 mg total) by mouth daily. TAKE 1 CAPSULE BY MOUTH EVERY DAY   Phendimetrazine Tartrate 35 MG TABS, 1-2 tabs twice a day as needed for appetite suppression   triamcinolone ointment (KENALOG) 0.1 %, Apply 1 application topically 2 (two) times daily. (Patient taking differently: Apply 1 application  topically daily as needed (rash).)  Health Maintenance:   Immunization History  Administered Date(s) Administered   Influenza Inj Mdck Quad With Preservative 10/25/2016   Influenza, High Dose Seasonal PF 12/06/2021   Influenza,  Seasonal, Injecte, Preservative Fre 01/01/2015   Influenza,inj,Quad PF,6+ Mos 10/25/2012, 01/20/2020, 11/16/2020   Influenza-Unspecified 06/05/2018, 12/06/2018   PFIZER(Purple Top)SARS-COV-2 Vaccination 05/03/2019, 05/29/2019   PPD Test 04/11/2013   Pneumococcal Polysaccharide-23 03/28/2012   Tdap 12/12/2012   Tetanus: 2014 Pneumovax: 2014 Flu vaccine: 2020 at [facility]  Pap: Lavoie, Dec 2019 MGM: 06/2019 Last Dental Exam: Yearly visit Colonoscopy: Referral placed today   Patient Care Team: Lucky Cowboy, MD as PCP - General (Internal Medicine) Antionette Char, MD as Consulting Physician (Obstetrics and Gynecology) Cindee Salt, MD as Consulting Physician (Orthopedic Surgery) Lucky Cowboy, MD as Referring Physician (Internal Medicine)  Medical History:  Past Medical History:  Diagnosis Date   Anxiety    Arthritis    Bipolar disorder, unspecified (HCC)    Depression    no meds   Diaphragmatic hernia with obstruction, without gangrene 11/07/2018   Dyspnea    doe with exertion occ   Family history of breast cancer    Family history of colon cancer    Family history of lung cancer    Family history of pancreatic cancer    Family history of prostate cancer    GERD (gastroesophageal reflux disease)    Hernia, diaphragmatic    History of kidney stones    Hyperlipidemia    Hypertension    no meds   Pneumonia    1 time   Allergies Allergies  Allergen Reactions   Doxycycline Shortness Of Breath and Anxiety   Erythromycin Nausea Only and Other (See Comments)    Stomach cramping (can take Z Pak)   Flexeril [Cyclobenzaprine Hcl] Other (See Comments)    "don't like the way I feel"   Metformin And Related Other (See Comments)    GI Upset    SURGICAL HISTORY She  has a past surgical history that includes Dilation and evacuation; Dilation and curettage of uterus; Video assisted thoracoscopy (vats)/thorocotomy (Right, 03/23/2018); epigastric hernia repair (N/A,  03/23/2018); IR THORACENTESIS ASP PLEURAL SPACE W/IMG GUIDE (04/19/2018); and Dilatation & curettage/hysteroscopy with myosure (N/A, 10/18/2019).   FAMILY HISTORY Her family history includes Atrial fibrillation in her maternal aunt; Breast cancer in her cousin and maternal aunt; Breast cancer (age of onset: 3) in her mother; Cervical cancer in her paternal aunt; Colon cancer in her maternal aunt, maternal aunt, maternal uncle, and maternal uncle;  Colon polyps in her maternal aunt and mother; Congestive Heart Failure in her mother; Diabetes in her brother, maternal aunt, maternal uncle, and mother; Heart attack in her maternal aunt; Heart disease in her maternal aunt, maternal grandmother, and maternal uncle; Hemochromatosis in her paternal uncle; Hyperlipidemia in her brother, maternal aunt, maternal uncle, and mother; Hypertension in her brother, maternal aunt, maternal uncle, and mother; Kidney disease in her mother; Lung cancer in her mother; Ovarian cancer in her paternal aunt; Polycystic ovary syndrome in her paternal aunt; Prostate cancer in her cousin, cousin, and maternal uncle; Stroke in her maternal aunt, maternal aunt, and maternal uncle.   SOCIAL HISTORY She  reports that she has never smoked. She has never used smokeless tobacco. She reports current alcohol use. She reports that she does not use drugs.  Review of Systems: Review of Systems  Constitutional:  Negative for chills, fever and malaise/fatigue.       Weight gain  HENT:  Negative for congestion, ear pain and sore throat.   Eyes: Negative.   Respiratory:  Negative for cough, shortness of breath and wheezing.   Cardiovascular:  Negative for chest pain, palpitations and leg swelling.  Gastrointestinal:  Positive for heartburn (controlled with med). Negative for abdominal pain, blood in stool, constipation, diarrhea, melena, nausea and vomiting.  Genitourinary:  Negative for dysuria, frequency, hematuria and urgency.   Musculoskeletal:  Positive for back pain (lower back aching, no radiation). Negative for joint pain, myalgias and neck pain.  Neurological:  Negative for dizziness, loss of consciousness and headaches.  Psychiatric/Behavioral:  Positive for depression. The patient is not nervous/anxious and does not have insomnia.     Physical Exam: Estimated body mass index is 46.63 kg/m as calculated from the following:   Height as of this encounter: 5\' 1"  (1.549 m).   Weight as of this encounter: 246 lb 12.8 oz (111.9 kg). BP 134/80   Pulse 90   Temp 97.9 F (36.6 C)   Resp 17   Ht 5\' 1"  (1.549 m)   Wt 246 lb 12.8 oz (111.9 kg)   SpO2 98%   BMI 46.63 kg/m   General Appearance: Morbidly obese pleasant female, appears to be in pain Eyes: PERRLA, EOMs, conjunctiva no swelling or erythema ENT/Mouth: Ear canals normal without obstruction, swelling, erythema, or discharge.  TMs normal bilaterally with no erythema, bulging, retraction, or loss of landmark.  Oropharynx moist and clear with no exudate, erythema, or swelling.  Neck: Supple, thyroid normal. No bruits.  No cervical adenopathy Respiratory: Respiratory effort normal, Breath sounds clear A&P without wheeze, rhonchi, rales.   Cardio: RRR without murmurs, rubs or gallops. Brisk peripheral pulses without edema.  Abdomen: Soft, nontender, no guarding, rebound, hernias, masses, or organomegaly.  Lymphatics: Non tender without lymphadenopathy.  Musculoskeletal: Full ROM all peripheral extremities,5/5 strength. Unable to palpate muscle spasm in lower back, unable to reproduce pain,  antalgic gait Skin: Warm, dry .  Neuro: Awake and oriented X 3, Cranial nerves intact, reflexes equal bilaterally. Normal muscle tone, no cerebellar symptoms.  Psych:  normal affect, Insight and Judgment appropriate.   Revonda Humphrey ANP-C  [facility]  07/20/2022

## 2022-07-20 NOTE — Patient Instructions (Signed)
Acute Back Pain, Adult Acute back pain is sudden and usually short-lived. It is often caused by an injury to the muscles and tissues in the back. The injury may result from: A muscle, tendon, or ligament getting overstretched or torn. Ligaments are tissues that connect bones to each other. Lifting something improperly can cause a back strain. Wear and tear (degeneration) of the spinal disks. Spinal disks are circular tissue that provide cushioning between the bones of the spine (vertebrae). Twisting motions, such as while playing sports or doing yard work. A hit to the back. Arthritis. You may have a physical exam, lab tests, and imaging tests to find the cause of your pain. Acute back pain usually goes away with rest and home care. Follow these instructions at home: Managing pain, stiffness, and swelling Take over-the-counter and prescription medicines only as told by your health care provider. Treatment may include medicines for pain and inflammation that are taken by mouth or applied to the skin, or muscle relaxants. Your health care provider may recommend applying ice during the first 24-48 hours after your pain starts. To do this: Put ice in a plastic bag. Place a towel between your skin and the bag. Leave the ice on for 20 minutes, 2-3 times a day. Remove the ice if your skin turns bright red. This is very important. If you cannot feel pain, heat, or cold, you have a greater risk of damage to the area. If directed, apply heat to the affected area as often as told by your health care provider. Use the heat source that your health care provider recommends, such as a moist heat pack or a heating pad. Place a towel between your skin and the heat source. Leave the heat on for 20-30 minutes. Remove the heat if your skin turns bright red. This is especially important if you are unable to feel pain, heat, or cold. You have a greater risk of getting burned. Activity  Do not stay in bed. Staying in  bed for more than 1-2 days can delay your recovery. Sit up and stand up straight. Avoid leaning forward when you sit or hunching over when you stand. If you work at a desk, sit close to it so you do not need to lean over. Keep your chin tucked in. Keep your neck drawn back, and keep your elbows bent at a 90-degree angle (right angle). Sit high and close to the steering wheel when you drive. Add lower back (lumbar) support to your car seat, if needed. Take short walks on even surfaces as soon as you are able. Try to increase the length of time you walk each day. Do not sit, drive, or stand in one place for more than 30 minutes at a time. Sitting or standing for long periods of time can put stress on your back. Do not drive or use heavy machinery while taking prescription pain medicine. Use proper lifting techniques. When you bend and lift, use positions that put less stress on your back: Bend your knees. Keep the load close to your body. Avoid twisting. Exercise regularly as told by your health care provider. Exercising helps your back heal faster and helps prevent back injuries by keeping muscles strong and flexible. Work with a physical therapist to make a safe exercise program, as recommended by your health care provider. Do any exercises as told by your physical therapist. Lifestyle Maintain a healthy weight. Extra weight puts stress on your back and makes it difficult to have good   posture. Avoid activities or situations that make you feel anxious or stressed. Stress and anxiety increase muscle tension and can make back pain worse. Learn ways to manage anxiety and stress, such as through exercise. General instructions Sleep on a firm mattress in a comfortable position. Try lying on your side with your knees slightly bent. If you lie on your back, put a pillow under your knees. Keep your head and neck in a straight line with your spine (neutral position) when using electronic equipment like  smartphones or pads. To do this: Raise your smartphone or pad to look at it instead of bending your head or neck to look down. Put the smartphone or pad at the level of your face while looking at the screen. Follow your treatment plan as told by your health care provider. This may include: Cognitive or behavioral therapy. Acupuncture or massage therapy. Meditation or yoga. Contact a health care provider if: You have pain that is not relieved with rest or medicine. You have increasing pain going down into your legs or buttocks. Your pain does not improve after 2 weeks. You have pain at night. You lose weight without trying. You have a fever or chills. You develop nausea or vomiting. You develop abdominal pain. Get help right away if: You develop new bowel or bladder control problems. You have unusual weakness or numbness in your arms or legs. You feel faint. These symptoms may represent a serious problem that is an emergency. Do not wait to see if the symptoms will go away. Get medical help right away. Call your local emergency services (911 in the [place] Do not drive yourself to the hospital. Summary Acute back pain is sudden and usually short-lived. Use proper lifting techniques. When you bend and lift, use positions that put less stress on your back. Take over-the-counter and prescription medicines only as told by your health care provider, and apply heat or ice as told. This information is not intended to replace advice given to you by your health care provider. Make sure you discuss any questions you have with your health care provider. Document Revised: 04/10/2020 Document Reviewed: 04/10/2020 Elsevier Patient Education  2024 [employer]  

## 2022-08-01 ENCOUNTER — Ambulatory Visit: Payer: BC Managed Care – PPO | Admitting: Nurse Practitioner

## 2022-08-17 DIAGNOSIS — F3341 Major depressive disorder, recurrent, in partial remission: Secondary | ICD-10-CM | POA: Diagnosis not present

## 2022-08-17 DIAGNOSIS — F419 Anxiety disorder, unspecified: Secondary | ICD-10-CM | POA: Diagnosis not present

## 2022-09-03 ENCOUNTER — Other Ambulatory Visit: Payer: Self-pay | Admitting: Nurse Practitioner

## 2022-09-07 ENCOUNTER — Encounter: Payer: Self-pay | Admitting: Nurse Practitioner

## 2022-09-13 DIAGNOSIS — G4733 Obstructive sleep apnea (adult) (pediatric): Secondary | ICD-10-CM | POA: Diagnosis not present

## 2022-09-15 ENCOUNTER — Ambulatory Visit: Payer: BC Managed Care – PPO | Admitting: Nurse Practitioner

## 2022-09-17 ENCOUNTER — Other Ambulatory Visit: Payer: Self-pay | Admitting: Nurse Practitioner

## 2022-09-17 DIAGNOSIS — G4700 Insomnia, unspecified: Secondary | ICD-10-CM

## 2022-09-19 MED ORDER — ESZOPICLONE 3 MG PO TABS
ORAL_TABLET | ORAL | 0 refills | Status: DC
Start: 2022-09-19 — End: 2022-12-22

## 2022-10-04 ENCOUNTER — Other Ambulatory Visit: Payer: Self-pay | Admitting: Nurse Practitioner

## 2022-10-04 DIAGNOSIS — M545 Low back pain, unspecified: Secondary | ICD-10-CM

## 2022-10-14 ENCOUNTER — Other Ambulatory Visit: Payer: Self-pay | Admitting: Nurse Practitioner

## 2022-10-14 DIAGNOSIS — J302 Other seasonal allergic rhinitis: Secondary | ICD-10-CM

## 2022-10-14 DIAGNOSIS — K219 Gastro-esophageal reflux disease without esophagitis: Secondary | ICD-10-CM

## 2022-10-14 DIAGNOSIS — G4733 Obstructive sleep apnea (adult) (pediatric): Secondary | ICD-10-CM | POA: Diagnosis not present

## 2022-11-03 ENCOUNTER — Other Ambulatory Visit: Payer: Self-pay | Admitting: Nurse Practitioner

## 2022-11-03 ENCOUNTER — Encounter: Payer: Self-pay | Admitting: Nurse Practitioner

## 2022-11-03 DIAGNOSIS — F330 Major depressive disorder, recurrent, mild: Secondary | ICD-10-CM

## 2022-11-13 DIAGNOSIS — G4733 Obstructive sleep apnea (adult) (pediatric): Secondary | ICD-10-CM | POA: Diagnosis not present

## 2022-11-22 ENCOUNTER — Other Ambulatory Visit: Payer: Self-pay | Admitting: Internal Medicine

## 2022-11-22 DIAGNOSIS — Z1231 Encounter for screening mammogram for malignant neoplasm of breast: Secondary | ICD-10-CM

## 2022-12-05 ENCOUNTER — Other Ambulatory Visit: Payer: Self-pay | Admitting: Nurse Practitioner

## 2022-12-05 DIAGNOSIS — K219 Gastro-esophageal reflux disease without esophagitis: Secondary | ICD-10-CM

## 2022-12-05 MED ORDER — OMEPRAZOLE 40 MG PO CPDR: 40.0000 mg | DELAYED_RELEASE_CAPSULE | Freq: Every day | ORAL | 0 refills | Status: DC

## 2022-12-08 ENCOUNTER — Ambulatory Visit: Payer: BC Managed Care – PPO | Admitting: Adult Health

## 2022-12-08 ENCOUNTER — Encounter: Payer: Self-pay | Admitting: Adult Health

## 2022-12-08 VITALS — BP 138/82 | HR 88 | Ht 61.0 in | Wt 238.0 lb

## 2022-12-08 DIAGNOSIS — G4719 Other hypersomnia: Secondary | ICD-10-CM

## 2022-12-08 DIAGNOSIS — G4733 Obstructive sleep apnea (adult) (pediatric): Secondary | ICD-10-CM

## 2022-12-08 NOTE — Patient Instructions (Addendum)
Your Plan:  Continue nightly use of CPAP for adequate sleep apnea management   Continue to follow with DME for any needed supplies or CPAP related concerns      Follow up in 10 months or call earlier if needed     Thank you for coming to see [place] at [facility]. I hope we have been able to provide you high quality care today.  You may receive a patient satisfaction survey over the next few weeks. We would appreciate your feedback and comments so that we may continue to improve ourselves and the health of our patients.
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
## 2022-12-15 ENCOUNTER — Ambulatory Visit: Payer: BC Managed Care – PPO

## 2022-12-22 ENCOUNTER — Other Ambulatory Visit: Payer: Self-pay | Admitting: Nurse Practitioner

## 2022-12-22 DIAGNOSIS — G4700 Insomnia, unspecified: Secondary | ICD-10-CM

## 2022-12-22 MED ORDER — ESZOPICLONE 3 MG PO TABS
ORAL_TABLET | ORAL | 0 refills | Status: DC
Start: 2022-12-22 — End: 2023-03-30

## 2022-12-25 ENCOUNTER — Encounter: Payer: Self-pay | Admitting: Nurse Practitioner

## 2023-01-02 NOTE — Progress Notes (Unsigned)
Assessment and Plan:   Samantha Greene was seen today for follow-up.  Diagnoses and all orders for this visit:  Mild episode of recurrent major depressive disorder (HCC)  Continue to follow with [name] PA-C [facility]  Continue current medications- Cymbalta, Rexulti, wellbutrin  Monitor symptoms  If any thoughts of hurting self or others she is to go to the ER immediately.   Essential hypertension -     CBC with Differential/Platelet -  continue medications: Telmisartan 40 mg  DASH diet, exercise and monitor at home. Call if greater than 130/80.   - Go to the ER if any chest pain, shortness of breath, nausea, dizziness, severe HA, changes vision/speech  - CBC  Mixed hyperlipidemia -     COMPLETE METABOLIC PANEL WITH GFR -     Lipid panel - Continue Rosuvastatin 5 mg, diet and exercise  Gastroesophageal reflux disease without esophagitis - Continue Prilosec and behavior modifications  Obesity Long discussion about weight loss, diet, and exercise Recommended diet heavy in fruits and veggies and low in animal meats, cheeses, and dairy products, appropriate calorie intake Patient will work on decreasing saturated fats and simple carbs, increase protein Currently on compounded Semaglutide from another clinic Follow up at next visit  Vitamin D deficiency Continue Vit D supplementation to maintain value in therapeutic level of 60-100    Abnormal glucose  Continue to work on diet and exercise  A1c  Insomnia Continue practicing good sleep hygiene Continue Eszopiclone, avoid nightly use   Costochondritis Continue Meloxicam Rest Heat If no improvement notify the office   Flu vaccine need - Fluzone trivalent flu vaccine given      Further disposition pending results if labs check today. Discussed med's effects and SE's.   Over 30 minutes of face to face interview, exam, counseling, chart review, and critical decision making was performed.    Discussed med's effects and SE's. Screening labs and tests as requested with regular follow-up as recommended. Future Appointments  Date Time Provider Department Center  04/10/2023  2:00 PM [name], NP [facility] None  10/12/2023  2:15 PM [name], NP [facility] None    HPI  52 y.o. female  presents for 6 month follow up of HTN, Hyperlipidemia, Obesity, Depression, Abnormal glucose, GERD  She has OSA on CPAP.  She is wearing it regularly.   She has been having right sided pain x 1 week.  Has been taking care of her aunt and doind a lot of pulling her up in bed as she is on hospice  Her heartburn is controlled with Omeprazole unless dietary choices are poor.   BMI is Body mass index is 44.55 kg/m., she is working on diet and exercise. Shehas not been exercising, taking care of her elderly aunt who is in hospice. . She has met with a nutritionist. She is on compounded Semaglutide, down 11 pounds in the past 6 months Wt Readings from Last 3 Encounters:  01/03/23 235 lb 12.8 oz (107 kg)  12/08/22 238 lb (108 kg)  07/20/22 246 lb 12.8 oz (111.9 kg)   Her blood pressure has been controlled at home, currently on Telmisartan 40mg  every day. Has not been taking hydrochlorothiazide and denies fluid retention and BP has been in normal range today their BP is BP: 124/88   BP Readings from Last 3 Encounters:  01/03/23 124/88  12/08/22 138/82  07/20/22 134/80  She does workout. She denies chest pain, shortness of breath, dizziness.  She is on cholesterol medication, Rosuvastatin 5 mg,  and denies myalgias. Her cholesterol is at goal. The cholesterol last visit was:  Lab Results  Component Value Date   CHOL 174 07/20/2022   HDL 68 07/20/2022   LDLCALC 77 07/20/2022   TRIG 193 (H) 07/20/2022   CHOLHDL 2.6 07/20/2022  . She has been working on diet and exercise for prediabetes, she is on bASA, she is on ACE/ARB and denies foot ulcerations, hyperglycemia, hypoglycemia , increased  appetite, nausea, paresthesia of the feet, polydipsia, polyuria, visual disturbances, vomiting and weight loss. Last A1C in the office was:  Lab Results  Component Value Date   HGBA1C 6.3 (H) 04/05/2022   Patient is on Vitamin D supplement, she is on 5000 daily.   Lab Results  Component Value Date   VD25OH 41 04/05/2022     She is currently on Wellbutrin 300mg  QD and Cymbalta 60mg  QD. Also using Rexulti 1 mg. Mood is stable. She follows with  [name] PA-C with [facility]    Current Medications:   Current Outpatient Medications (Endocrine & Metabolic):    SEMAGLUTIDE PO, Take by mouth. Compound  Current Outpatient Medications (Cardiovascular):    hydrochlorothiazide (HYDRODIURIL) 12.5 MG tablet, TAKE 2 TABLETS BY MOUTH DAILY.   rosuvastatin (CRESTOR) 5 MG tablet, TAKE 1 TABLET BY MOUTH EVERY DAY FOR CHOLESTEROL   telmisartan (MICARDIS) 40 MG tablet, TAKE 1 TABLET BY MOUTH EVERY DAY FOR BLOOD PRESSURE  Current Outpatient Medications (Respiratory):    albuterol (VENTOLIN HFA) 108 (90 Base) MCG/ACT inhaler, INHALE 2 PUFFS INTO THE LUNGS EVERY 4 HOURS AS NEEDED FOR WHEEZING OR SHORTNESS OF BREATH.   cetirizine (ZYRTEC) 10 MG tablet, Take 10 mg by mouth daily.   fexofenadine (ALLEGRA) 180 MG tablet, Take 180 mg by mouth daily as needed for allergies or rhinitis. Alternates with zyrtec when Zyrtec seems to not be as effective   levocetirizine (XYZAL) 5 MG tablet, Take 5 mg by mouth at bedtime.   montelukast (SINGULAIR) 10 MG tablet, TAKE 1 TABLET BY MOUTH DAILY FOR ALLERGIES   guaiFENesin (MUCINEX PO), Take by mouth. (Patient not taking: Reported on 01/03/2023)  Current Outpatient Medications (Analgesics):    Acetaminophen (TYLENOL PO), Take by mouth.   aspirin 81 MG tablet, Take 81 mg by mouth daily.   meloxicam (MOBIC) 15 MG tablet, TAKE 1/2-1 TABLET DAILY WITH FOOD FOR PAIN, INFLAMMATION. TRY LIMIT TO 5 DAYS /WEEK TO AVOID KIDNEY DAMAGE   Current Outpatient Medications  (Other):    Biotin 10 MG TABS, Take by mouth.   brexpiprazole (REXULTI) 1 MG TABS tablet, Take by mouth.   buPROPion (WELLBUTRIN XL) 300 MG 24 hr tablet, TAKE 1 TABLET EVERY MORNING FOR MOOD, FOCUS & CONCENTRATION   Cholecalciferol (VITAMIN D) 125 MCG (5000 UT) CAPS, Take 10,000 Units by mouth daily.   DULoxetine (CYMBALTA) 60 MG capsule, TAKE 1 CAPSULE BY MOUTH EVERY DAY   Eszopiclone 3 MG TABS, TAKE 1 IMMEDIATELY BEFORE BEDTIME   Multiple Vitamin (MULTIVITAMIN) tablet, Take 1 tablet by mouth daily.   omeprazole (PRILOSEC) 40 MG capsule, Take 1 capsule (40 mg total) by mouth daily. TAKE 1 CAPSULE BY MOUTH EVERY DAY   tizanidine (ZANAFLEX) 6 MG capsule, TAKE 1 CAPSULE (6 MG TOTAL) BY MOUTH 3 (THREE) TIMES DAILY AS NEEDED FOR MUSCLE SPASMS.   triamcinolone ointment (KENALOG) 0.1 %, Apply 1 application topically 2 (two) times daily. (Patient taking differently: Apply 1 application  topically daily as needed (rash).)   gabapentin (NEURONTIN)  300 MG capsule, TAKE 1 CAPSULE BY MOUTH THREE TIMES A DAY (Patient not taking: Reported on 01/03/2023)  Health Maintenance:      Patient Care Team: Lucky Cowboy, MD as PCP - General (Internal Medicine) Antionette Char, MD as Consulting Physician (Obstetrics and Gynecology) Cindee Salt, MD as Consulting Physician (Orthopedic Surgery) Lucky Cowboy, MD as Referring Physician (Internal Medicine)  Medical History:  Past Medical History:  Diagnosis Date   Anxiety    Arthritis    Bipolar disorder, unspecified (HCC)    Depression    no meds   Diaphragmatic hernia with obstruction, without gangrene 11/07/2018   Dyspnea    doe with exertion occ   Family history of breast cancer    Family history of colon cancer    Family history of lung cancer    Family history of pancreatic cancer    Family history of prostate cancer    GERD (gastroesophageal reflux disease)    Hernia, diaphragmatic    History of kidney stones    Hyperlipidemia     Hypertension    no meds   Pneumonia    1 time   Allergies Allergies  Allergen Reactions   Doxycycline Shortness Of Breath and Anxiety   Erythromycin Nausea Only and Other (See Comments)    Stomach cramping (can take Z Pak)   Flexeril [Cyclobenzaprine Hcl] Other (See Comments)    "don't like the way I feel"   Metformin And Related Other (See Comments)    GI Upset    SURGICAL HISTORY She  has a past surgical history that includes Dilation and evacuation; Dilation and curettage of uterus; Video assisted thoracoscopy (vats)/thorocotomy (Right, 03/23/2018); epigastric hernia repair (N/A, 03/23/2018); IR THORACENTESIS ASP PLEURAL SPACE W/IMG GUIDE (04/19/2018); and Dilatation & curettage/hysteroscopy with myosure (N/A, 10/18/2019).   FAMILY HISTORY Her family history includes Atrial fibrillation in her maternal aunt; Breast cancer in her cousin and maternal aunt; Breast cancer (age of onset: 1) in her mother; Cervical cancer in her paternal aunt; Colon cancer in her maternal aunt, maternal aunt, maternal uncle, and maternal uncle; Colon polyps in her maternal aunt and mother; Congestive Heart Failure in her mother; Diabetes in her brother, maternal aunt, maternal uncle, and mother; Heart attack in her maternal aunt; Heart disease in her maternal aunt, maternal grandmother, and maternal uncle; Hemochromatosis in her paternal uncle; Hyperlipidemia in her brother, maternal aunt, maternal uncle, and mother; Hypertension in her brother, maternal aunt, maternal uncle, and mother; Kidney disease in her mother; Lung cancer in her mother; Ovarian cancer in her paternal aunt; Polycystic ovary syndrome in her paternal aunt; Prostate cancer in her cousin, cousin, and maternal uncle; Stroke in her maternal aunt, maternal aunt, and maternal uncle.   SOCIAL HISTORY She  reports that she has never smoked. She has never used smokeless tobacco. She reports current alcohol use. She reports that she does not use  drugs.  Review of Systems: Review of Systems  Constitutional:  Negative for chills, fever and malaise/fatigue.  HENT:  Negative for congestion, ear pain and sore throat.   Eyes: Negative.   Respiratory:  Negative for cough, shortness of breath and wheezing.   Cardiovascular:  Negative for chest pain, palpitations and leg swelling.  Gastrointestinal:  Positive for heartburn. Negative for abdominal pain, blood in stool, constipation, diarrhea, melena, nausea and vomiting.  Genitourinary:  Negative for dysuria, frequency, hematuria and urgency.  Musculoskeletal:  Negative for back pain, joint pain, myalgias and neck pain.  Pain in right rib cage  Neurological:  Negative for dizziness, loss of consciousness and headaches.  Psychiatric/Behavioral:  Positive for depression. The patient is not nervous/anxious and does not have insomnia.     Physical Exam: Estimated body mass index is 44.55 kg/m as calculated from the following:   Height as of this encounter: 5\' 1"  (1.549 m).   Weight as of this encounter: 235 lb 12.8 oz (107 kg). BP 124/88   Pulse 87   Temp (!) 97.5 F (36.4 C)   Ht 5\' 1"  (1.549 m)   Wt 235 lb 12.8 oz (107 kg)   SpO2 95%   BMI 44.55 kg/m   General Appearance: Obese pleasant female, in no apparent distress.  Eyes: PERRLA, EOMs, conjunctiva no swelling or erythema ENT/Mouth: Ear canals normal without obstruction, swelling, erythema, or discharge.  TMs normal bilaterally with no erythema, bulging, retraction, or loss of landmark.  Oropharynx moist and clear with no exudate, erythema, or swelling.  Neck: Supple, thyroid normal. Respiratory: Respiratory effort normal, Breath sounds clear A&P without wheeze, rhonchi, rales.   Cardio: RRR without murmurs, rubs or gallops. Brisk peripheral pulses without edema.  Abdomen: Soft, nontender, no guarding, rebound, hernias, masses, or organomegaly.  Lymphatics: Non tender without lymphadenopathy.  Musculoskeletal: Full ROM  all peripheral extremities,5/5 strength, and normal gait. Tenderness of right rib cage Skin: Warm, dry . Neuro: Awake and oriented X 3, Cranial nerves intact, reflexes equal bilaterally. Normal muscle tone, no cerebellar symptoms.  Psych:  normal affect, Insight and Judgment appropriate.     Revonda Humphrey ANP-C  [facility]  01/03/2023

## 2023-01-03 ENCOUNTER — Encounter: Payer: Self-pay | Admitting: Nurse Practitioner

## 2023-01-03 ENCOUNTER — Ambulatory Visit (INDEPENDENT_AMBULATORY_CARE_PROVIDER_SITE_OTHER): Payer: BC Managed Care – PPO | Admitting: Nurse Practitioner

## 2023-01-03 VITALS — BP 124/88 | HR 87 | Temp 97.5°F | Ht 61.0 in | Wt 235.8 lb

## 2023-01-03 DIAGNOSIS — K219 Gastro-esophageal reflux disease without esophagitis: Secondary | ICD-10-CM

## 2023-01-03 DIAGNOSIS — R7309 Other abnormal glucose: Secondary | ICD-10-CM

## 2023-01-03 DIAGNOSIS — G4700 Insomnia, unspecified: Secondary | ICD-10-CM

## 2023-01-03 DIAGNOSIS — E559 Vitamin D deficiency, unspecified: Secondary | ICD-10-CM

## 2023-01-03 DIAGNOSIS — F330 Major depressive disorder, recurrent, mild: Secondary | ICD-10-CM

## 2023-01-03 DIAGNOSIS — E782 Mixed hyperlipidemia: Secondary | ICD-10-CM

## 2023-01-03 DIAGNOSIS — Z23 Encounter for immunization: Secondary | ICD-10-CM | POA: Diagnosis not present

## 2023-01-03 DIAGNOSIS — Z79899 Other long term (current) drug therapy: Secondary | ICD-10-CM

## 2023-01-03 DIAGNOSIS — I10 Essential (primary) hypertension: Secondary | ICD-10-CM

## 2023-01-03 DIAGNOSIS — M940 Chondrocostal junction syndrome [Tietze]: Secondary | ICD-10-CM

## 2023-01-03 DIAGNOSIS — R7989 Other specified abnormal findings of blood chemistry: Secondary | ICD-10-CM

## 2023-01-03 NOTE — Patient Instructions (Signed)
 Costochondritis  Costochondritis is irritation and swelling (inflammation) of the tissue that connects the ribs to the breastbone (sternum). This tissue is called cartilage. This condition causes pain in the front of the chest. The pain often starts slowly. It may be in more than one rib. What are the causes? The cause of this condition is not always known. It can come from stress on the sternum. The cause of this stress could be: Chest injury. Exercise or activity. This may include lifting. Very bad coughing. What increases the risk? Being female. Being 31-68 years old. Starting a new exercise or work activity. Having low levels of vitamin D. Having a condition that makes you cough a lot. What are the signs or symptoms? Chest pain that: Starts slowly. It can be sharp or dull. Gets worse with deep breathing, coughing, or exercise. Gets better with rest. May be worse when you press on your ribs and breastbone. How is this treated? In most cases, this condition goes away on its own over time. You may need to take an NSAID, such as ibuprofen. This can help reduce pain. You may also need to: Rest and stay away from activities that make pain worse. Put heat or ice on the area that hurts. Do exercises to stretch your chest muscles. If these treatments do not help, your doctor may inject a medicine to numb the area. This can help relieve the pain. Follow these instructions at home: Managing pain, stiffness, and swelling     If told, put ice on the painful area. To do this: Put ice in a plastic bag. Place a towel between your skin and the bag. Leave the ice on for 20 minutes, 2-3 times a day. If told, put heat on the affected area. Do this as often as told by your doctor. Use the heat source that your doctor recommends, such as a moist heat pack or a heating pad. Place a towel between your skin and the heat source. Leave the heat on for 20-30 minutes. If your skin turns bright red,  take off the ice or heat right away to prevent skin damage. The risk of skin damage is higher if you cannot feel pain, heat, or cold. Activity Rest as told by your doctor. Do not do things that make your pain worse. This includes activities that use your chest, belly (abdomen), and side muscles. You may have to avoid lifting. Ask your doctor how much you can safely lift. Return to your normal activities when your doctor says that it is safe. General instructions Take over-the-counter and prescription medicines only as told by your doctor. Contact a doctor if: You have chills or a fever. Your pain does not go away or gets worse. You have a cough that does not go away. Get help right away if: You have a hard time breathing. You have very bad chest pain that does not get better with medicines, heat, or ice. These symptoms may be an emergency. Get help right away. Call 911. Do not wait to see if the symptoms will go away. Do not drive yourself to the hospital. This information is not intended to replace advice given to you by your health care provider. Make sure you discuss any questions you have with your health care provider. Document Revised: 08/05/2021 Document Reviewed: 08/05/2021 Elsevier Patient Education  2024 [employer].

## 2023-01-04 LAB — COMPLETE METABOLIC PANEL WITH GFR
AG Ratio: 2 (calc) (ref 1.0–2.5)
ALT: 49 U/L — ABNORMAL HIGH (ref 6–29)
AST: 33 U/L (ref 10–35)
Albumin: 4.5 g/dL (ref 3.6–5.1)
Alkaline phosphatase (APISO): 71 U/L (ref 37–153)
BUN: 11 mg/dL (ref 7–25)
CO2: 27 mmol/L (ref 20–32)
Calcium: 9.4 mg/dL (ref 8.6–10.4)
Chloride: 103 mmol/L (ref 98–110)
Creat: 1 mg/dL (ref 0.50–1.03)
Globulin: 2.2 g/dL (ref 1.9–3.7)
Glucose, Bld: 88 mg/dL (ref 65–99)
Potassium: 4.2 mmol/L (ref 3.5–5.3)
Sodium: 139 mmol/L (ref 135–146)
Total Bilirubin: 0.6 mg/dL (ref 0.2–1.2)
Total Protein: 6.7 g/dL (ref 6.1–8.1)
eGFR: 68 mL/min/{1.73_m2} (ref >=60)

## 2023-01-04 LAB — LIPID PANEL
Cholesterol: 157 mg/dL (ref <200)
HDL: 65 mg/dL (ref >=50)
LDL Cholesterol (Calc): 72 mg/dL
Non-HDL Cholesterol (Calc): 92 mg/dL (ref <130)
Total CHOL/HDL Ratio: 2.4 (calc) (ref <5.0)
Triglycerides: 123 mg/dL (ref <150)

## 2023-01-04 LAB — CBC WITH DIFFERENTIAL/PLATELET
Absolute Lymphocytes: 1642 {cells}/uL (ref 850–3900)
Absolute Monocytes: 504 {cells}/uL (ref 200–950)
Basophils Absolute: 29 {cells}/uL (ref 0–200)
Basophils Relative: 0.6 %
Eosinophils Absolute: 72 {cells}/uL (ref 15–500)
Eosinophils Relative: 1.5 %
HCT: 40.1 % (ref 35.0–45.0)
Hemoglobin: 13.6 g/dL (ref 11.7–15.5)
MCH: 32.5 pg (ref 27.0–33.0)
MCHC: 33.9 g/dL (ref 32.0–36.0)
MCV: 95.7 fL (ref 80.0–100.0)
MPV: 10.1 fL (ref 7.5–12.5)
Monocytes Relative: 10.5 %
Neutro Abs: 2554 {cells}/uL (ref 1500–7800)
Neutrophils Relative %: 53.2 %
Platelets: 267 10*3/uL (ref 140–400)
RBC: 4.19 10*6/uL (ref 3.80–5.10)
RDW: 12.7 % (ref 11.0–15.0)
Total Lymphocyte: 34.2 %
WBC: 4.8 10*3/uL (ref 3.8–10.8)

## 2023-01-04 LAB — HEMOGLOBIN A1C W/OUT EAG: Hgb A1c MFr Bld: 5.6 %{Hb} (ref <5.7)

## 2023-01-08 DIAGNOSIS — G4733 Obstructive sleep apnea (adult) (pediatric): Secondary | ICD-10-CM | POA: Diagnosis not present

## 2023-01-26 ENCOUNTER — Other Ambulatory Visit: Payer: Self-pay | Admitting: Nurse Practitioner

## 2023-01-26 DIAGNOSIS — M545 Low back pain, unspecified: Secondary | ICD-10-CM

## 2023-02-08 DIAGNOSIS — G4733 Obstructive sleep apnea (adult) (pediatric): Secondary | ICD-10-CM | POA: Diagnosis not present

## 2023-03-11 DIAGNOSIS — G4733 Obstructive sleep apnea (adult) (pediatric): Secondary | ICD-10-CM | POA: Diagnosis not present

## 2023-03-20 ENCOUNTER — Encounter: Payer: BC Managed Care – PPO | Admitting: Nurse Practitioner

## 2023-03-22 ENCOUNTER — Other Ambulatory Visit: Payer: Self-pay

## 2023-03-22 DIAGNOSIS — M545 Low back pain, unspecified: Secondary | ICD-10-CM

## 2023-03-22 MED ORDER — TIZANIDINE HCL 6 MG PO CAPS: 6.0000 mg | ORAL_CAPSULE | Freq: Three times a day (TID) | ORAL | 0 refills | Status: DC | PRN

## 2023-03-23 ENCOUNTER — Other Ambulatory Visit: Payer: Self-pay

## 2023-03-23 DIAGNOSIS — I10 Essential (primary) hypertension: Secondary | ICD-10-CM

## 2023-03-23 MED ORDER — TELMISARTAN 40 MG PO TABS: ORAL_TABLET | ORAL | 0 refills | Status: AC

## 2023-03-27 DIAGNOSIS — G4700 Insomnia, unspecified: Secondary | ICD-10-CM | POA: Diagnosis not present

## 2023-03-27 DIAGNOSIS — Z79899 Other long term (current) drug therapy: Secondary | ICD-10-CM | POA: Diagnosis not present

## 2023-03-27 DIAGNOSIS — F419 Anxiety disorder, unspecified: Secondary | ICD-10-CM | POA: Diagnosis not present

## 2023-03-27 DIAGNOSIS — F3341 Major depressive disorder, recurrent, in partial remission: Secondary | ICD-10-CM | POA: Diagnosis not present

## 2023-03-27 DIAGNOSIS — I10 Essential (primary) hypertension: Secondary | ICD-10-CM | POA: Diagnosis not present

## 2023-03-27 DIAGNOSIS — F339 Major depressive disorder, recurrent, unspecified: Secondary | ICD-10-CM | POA: Diagnosis not present

## 2023-03-30 ENCOUNTER — Other Ambulatory Visit: Payer: Self-pay

## 2023-03-30 DIAGNOSIS — G4700 Insomnia, unspecified: Secondary | ICD-10-CM

## 2023-03-30 DIAGNOSIS — N939 Abnormal uterine and vaginal bleeding, unspecified: Secondary | ICD-10-CM | POA: Diagnosis not present

## 2023-03-30 DIAGNOSIS — N959 Unspecified menopausal and perimenopausal disorder: Secondary | ICD-10-CM | POA: Diagnosis not present

## 2023-03-30 DIAGNOSIS — N920 Excessive and frequent menstruation with regular cycle: Secondary | ICD-10-CM | POA: Diagnosis not present

## 2023-03-30 MED ORDER — ESZOPICLONE 3 MG PO TABS: ORAL_TABLET | ORAL | 0 refills | Status: DC

## 2023-04-05 DIAGNOSIS — N920 Excessive and frequent menstruation with regular cycle: Secondary | ICD-10-CM | POA: Diagnosis not present

## 2023-04-05 DIAGNOSIS — N939 Abnormal uterine and vaginal bleeding, unspecified: Secondary | ICD-10-CM | POA: Diagnosis not present

## 2023-04-05 DIAGNOSIS — Z01411 Encounter for gynecological examination (general) (routine) with abnormal findings: Secondary | ICD-10-CM | POA: Diagnosis not present

## 2023-04-10 ENCOUNTER — Encounter: Payer: BC Managed Care – PPO | Admitting: Nurse Practitioner

## 2023-04-12 DIAGNOSIS — G4733 Obstructive sleep apnea (adult) (pediatric): Secondary | ICD-10-CM | POA: Diagnosis not present

## 2023-04-18 DIAGNOSIS — N939 Abnormal uterine and vaginal bleeding, unspecified: Secondary | ICD-10-CM | POA: Diagnosis not present

## 2023-04-18 DIAGNOSIS — Z1231 Encounter for screening mammogram for malignant neoplasm of breast: Secondary | ICD-10-CM | POA: Diagnosis not present

## 2023-04-18 DIAGNOSIS — R9389 Abnormal findings on diagnostic imaging of other specified body structures: Secondary | ICD-10-CM | POA: Diagnosis not present

## 2023-04-18 LAB — HM MAMMOGRAPHY

## 2023-04-19 ENCOUNTER — Ambulatory Visit: Payer: BC Managed Care – PPO | Admitting: Family Medicine

## 2023-04-27 DIAGNOSIS — K5904 Chronic idiopathic constipation: Secondary | ICD-10-CM | POA: Diagnosis not present

## 2023-04-27 DIAGNOSIS — Z1211 Encounter for screening for malignant neoplasm of colon: Secondary | ICD-10-CM | POA: Diagnosis not present

## 2023-04-27 DIAGNOSIS — G4733 Obstructive sleep apnea (adult) (pediatric): Secondary | ICD-10-CM | POA: Diagnosis not present

## 2023-04-27 DIAGNOSIS — K219 Gastro-esophageal reflux disease without esophagitis: Secondary | ICD-10-CM | POA: Diagnosis not present

## 2023-05-04 ENCOUNTER — Encounter (HOSPITAL_COMMUNITY): Payer: Self-pay | Admitting: Student

## 2023-05-05 ENCOUNTER — Encounter (HOSPITAL_COMMUNITY): Payer: Self-pay | Admitting: Student

## 2023-05-05 NOTE — Progress Notes (Addendum)
 Spoke w/ via phone for pre-op interview--- pt Lab needs dos----  urine preg       Lab results------ lab appt 05-09-2023 @ 1130 getting CBC/ CMP/ T&S/ EKG COVID test -----patient states asymptomatic no test needed Arrive at ------- 0630 on 05-15-2023 NPO after MN NO Solid Food.  Clear liquids from MN until--- 0530 Pre-Surgery Ensure or G2: n/a ERAS protocol:  yes  Med rec completed Medications to take morning of surgery -----  wellbutrin, zytec, cymbalta, prilosec, linzess, colace, rexulti Diabetic medication ----- n/a  GLP1 agonist last dose:  05-01-2023 GLP1 instructions:  pt stated was given any instructions by office.  Reviewed guidelines with patient.  Pt verbalized understanding 05-01-2023 is to be her last dose , not to do next dose prior to surgery and not do again until after surgery.  Patient instructed to bring photo id and insurance card  PAT appointment Patient aware to have Driver (ride ) / caregiver    for 24 hours after surgery -  husband, lennie Patient Special Instructions ----- will pick up bag w/ CHG and written instructions at lab appt Asked to bring in cpap/ mask/ tubing with her dos Pre-Op special Instructions ----- n/a  Patient verbalized understanding of instructions that were given at this phone interview. Patient denies chest pain, sob, fever, cough at the interview.    Anesthesia Review: HTN;  OSA / Central sleep apnea syndrome w/ cpap & 2L O2 into cpap ,  pt stated uses nightly;  pre-diabetes Pt stated DOE with stairs / long distance walking (recovers quickly when stops) but no sob with household chores.   PCP:  Dr C. Allman [facility] 03-27-2023 Sleep Apnea:  Dr Dohmeier (lov 12-08-2022  Chest x-ray : 09-03-2019 EKG : 03-17-2021 Echo : 09-18-2018 Stress test: no Cardiac Cath : no  Activity level:  see above Sleep Study/ CPAP :   11-07-2018 in epic Fasting Blood Sugar : / Checks Blood Sugar -- times a day:  does not check  Blood Thinner/ Instructions  /Last Dose:  no ASA / Instructions/ Last Dose :   ASA 81 mg/  pt stated surgeon had not mentioned it/  pt given instruction to stop week prior to surgery unless when she calls office by surgeon instructions

## 2023-05-05 NOTE — Pre-Procedure Instructions (Addendum)
 Surgical Instructions   Your procedure is scheduled on :  Monday,  05-15-2023. Report to [facility] Main Entrance "A" at 6:30 A.M., then check in with the Admitting office. Any questions or running late day of surgery: call [phone]  Questions prior to your surgery date: call [phone], Monday-Friday, 8am-4pm. If you experience any cold or flu symptoms such as cough, fever, chills, shortness of breath, etc. between now and your scheduled surgery, please notify your surgeon office.    Remember:  Do not eat any food after midnight the night before your surgery.  You may have clear liquids from midnight night before surgery until 5:30 AM   You may drink clear liquids until 5:30 AM the morning of your surgery.   Clear liquids allowed are:  Water Carbonated Beverages (low sugar, diet)  Clear Tea (may use non-sweetener, no milk, honey, etc.) Black Coffee Only (may use non-sweetener, NO MILK, CREAM OR POWDERED CREAMER of any kind) Sports drink like , Gatorade  (low sugar, sugar free)    Take these medicines the morning of surgery with A SIPS OF WATER : Bupropion (wellbutrin) Duloxetine (cymbalta) Brexpiprazole (rexulti) Docusate sodium (colace) Linaclotide (linzess) Omeprazole (prilosec)   May take these medicines IF NEEDED: Tizanidine (zanaflex)   One week prior to surgery, STOP taking any Aspirin (unless otherwise instructed by your surgeon) Aleve, Naproxen, Ibuprofen, Motrin, Advil, Goody's, BC's, all herbal medications, fish oil, and non-prescription vitamins.                     Do NOT Smoke (Tobacco/Vaping) for 24 hours prior to your procedure. Do NOT drink alcohol 24 hours prior to your surgery  Please bring your CPAP/ Mask/ Tubing in with you day of surgery this may be needed during recovery and if stay overnight .   You will be asked to remove any contacts, glasses, piercing's, hearing aid's, dentures/partials prior to surgery. Please bring cases for these items  if needed.    Patients discharged the day of surgery will not be allowed to drive home, and someone needs to stay with them for 24 hours.  SURGICAL WAITING ROOM VISITATION Patients may have no more than 2 support people in the waiting area - these visitors may rotate.   Pre-op nurse will coordinate an appropriate time for 1 ADULT support person, who may not rotate, to accompany patient in pre-op.  Children under the age of 28 must have an adult with them who is not the patient and must remain in the main waiting area with an adult.  If the patient needs to stay at the hospital during part of their recovery, the visitor guidelines for inpatient rooms apply.  Please refer to the [facility] website for the visitor guidelines for any additional information.   If you received a COVID test during your pre-op visit  it is requested that you wear a mask when out in public, stay away from anyone that may not be feeling well and notify your surgeon if you develop symptoms. If you have been in contact with anyone that has tested positive in the last 10 days please notify you surgeon.      Pre-operative CHG Bathing Instructions   You can play a key role in reducing the risk of infection after surgery. Your skin needs to be as free of germs as possible. You can reduce the number of germs on your skin by washing with CHG (chlorhexidine gluconate) soap before surgery. CHG is an antiseptic soap  that kills germs and continues to kill germs even after washing.   DO NOT use if you have an allergy to chlorhexidine/CHG or antibacterial soaps. If your skin becomes reddened or irritated, stop using the CHG and notify Pre-Op day of surgery.  If you have skin irritation or problems with the surgical soap (CHG), do not use.  Please get a bar of dial soap or antibacterial soap and shower following the instructions below.             TAKE A SHOWER THE NIGHT BEFORE SURGERY AND THE DAY OF SURGERY    Please keep in  mind the following:  DO NOT shave, including legs and underarms, 48 hours prior to surgery.   You may shave your face before/day of surgery.  Place clean sheets on your bed the night before surgery Use a clean washcloth (not used since being washed) for each shower. DO NOT sleep with pet's night before surgery.  CHG Shower Instructions:  Wash your face and private area with normal soap. If you choose to wash your hair, wash first with your normal shampoo.  After you use shampoo/soap, rinse your hair and body thoroughly to remove shampoo/soap residue.  Turn the water OFF and apply half the bottle of CHG soap to a CLEAN washcloth.  Apply CHG soap ONLY FROM YOUR NECK DOWN TO YOUR TOES (washing for 3-5 minutes)  DO NOT use CHG soap on face, private areas, open wounds, or sores.  Pay special attention to the area where your surgery is being performed.  If you are having back surgery, having someone wash your back for you may be helpful. Wait 2 minutes after CHG soap is applied, then you may rinse off the CHG soap.  Pat dry with a clean towel  Put on clean pajamas    Additional instructions for the day of surgery: DO NOT APPLY any lotions, oils, deodorants (may use underarm deodorant) Cologne/ perfumes or makeup  Do not wear jewelry / piercing's/ metal/ permanent jewelry must be removed prior to arrival (this includes plastic jewelry/ piercing) Do not wear nail polish, gel polish, artificial nails, or any other type of covering on natural nails (fingers and toes) Do not bring valuables to the hospital. [facility] is not responsible for valuables/personal belongings. Put on clean/comfortable clothes.  Please brush your teeth.  Ask your nurse before applying any prescription medications to the skin.

## 2023-05-08 DIAGNOSIS — I10 Essential (primary) hypertension: Secondary | ICD-10-CM | POA: Diagnosis not present

## 2023-05-08 DIAGNOSIS — F5104 Psychophysiologic insomnia: Secondary | ICD-10-CM | POA: Diagnosis not present

## 2023-05-08 DIAGNOSIS — Z131 Encounter for screening for diabetes mellitus: Secondary | ICD-10-CM | POA: Diagnosis not present

## 2023-05-08 DIAGNOSIS — Z789 Other specified health status: Secondary | ICD-10-CM | POA: Diagnosis not present

## 2023-05-08 DIAGNOSIS — Z23 Encounter for immunization: Secondary | ICD-10-CM | POA: Diagnosis not present

## 2023-05-08 DIAGNOSIS — Z114 Encounter for screening for human immunodeficiency virus [HIV]: Secondary | ICD-10-CM | POA: Diagnosis not present

## 2023-05-08 DIAGNOSIS — Z0000 Encounter for general adult medical examination without abnormal findings: Secondary | ICD-10-CM | POA: Diagnosis not present

## 2023-05-08 DIAGNOSIS — Z1159 Encounter for screening for other viral diseases: Secondary | ICD-10-CM | POA: Diagnosis not present

## 2023-05-08 LAB — HM HEPATITIS C SCREENING LAB: HM Hepatitis Screen: NEGATIVE

## 2023-05-08 LAB — HM HIV SCREENING LAB: HM HIV Screening: NEGATIVE

## 2023-05-09 ENCOUNTER — Encounter (HOSPITAL_COMMUNITY)
Admission: RE | Admit: 2023-05-09 | Discharge: 2023-05-09 | Disposition: A | Discharge status: Home / Self Care | Source: Ambulatory Visit | Attending: Student | Admitting: Student

## 2023-05-09 DIAGNOSIS — Z1151 Encounter for screening for human papillomavirus (HPV): Secondary | ICD-10-CM | POA: Diagnosis not present

## 2023-05-09 DIAGNOSIS — Z1272 Encounter for screening for malignant neoplasm of vagina: Secondary | ICD-10-CM | POA: Diagnosis not present

## 2023-05-09 DIAGNOSIS — N8500 Endometrial hyperplasia, unspecified: Secondary | ICD-10-CM | POA: Diagnosis not present

## 2023-05-09 DIAGNOSIS — Z01818 Encounter for other preprocedural examination: Secondary | ICD-10-CM | POA: Diagnosis not present

## 2023-05-09 DIAGNOSIS — Z124 Encounter for screening for malignant neoplasm of cervix: Secondary | ICD-10-CM | POA: Diagnosis not present

## 2023-05-09 LAB — COMPREHENSIVE METABOLIC PANEL WITH GFR
ALT: 22 U/L (ref 0–44)
AST: 29 U/L (ref 15–41)
Albumin: 4.1 g/dL (ref 3.5–5.0)
Alkaline Phosphatase: 46 U/L (ref 38–126)
Anion gap: 10 (ref 5–15)
BUN: 8 mg/dL (ref 6–20)
CO2: 21 mmol/L — ABNORMAL LOW (ref 22–32)
Calcium: 9.4 mg/dL (ref 8.9–10.3)
Chloride: 106 mmol/L (ref 98–111)
Creatinine, Ser: 1.1 mg/dL — ABNORMAL HIGH (ref 0.44–1.00)
GFR, Estimated: >60 mL/min (ref >60)
Glucose, Bld: 116 mg/dL — ABNORMAL HIGH (ref 70–99)
Potassium: 3.9 mmol/L (ref 3.5–5.1)
Sodium: 137 mmol/L (ref 135–145)
Total Bilirubin: 0.7 mg/dL (ref 0.0–1.2)
Total Protein: 7.6 g/dL (ref 6.5–8.1)

## 2023-05-09 LAB — CBC
HCT: 41.7 % (ref 36.0–46.0)
Hemoglobin: 13.7 g/dL (ref 12.0–15.0)
MCH: 32.4 pg (ref 26.0–34.0)
MCHC: 32.9 g/dL (ref 30.0–36.0)
MCV: 98.6 fL (ref 80.0–100.0)
Platelets: 337 10*3/uL (ref 150–400)
RBC: 4.23 MIL/uL (ref 3.87–5.11)
RDW: 13.4 % (ref 11.5–15.5)
WBC: 6.3 10*3/uL (ref 4.0–10.5)
nRBC: 0 % (ref 0.0–0.2)

## 2023-05-09 LAB — TYPE AND SCREEN
ABO/RH(D): A POS
Antibody Screen: NEGATIVE

## 2023-05-09 LAB — RESULTS CONSOLE HPV: CHL HPV: NEGATIVE

## 2023-05-10 ENCOUNTER — Other Ambulatory Visit: Payer: Self-pay | Admitting: Gastroenterology

## 2023-05-11 DIAGNOSIS — Z01818 Encounter for other preprocedural examination: Secondary | ICD-10-CM | POA: Diagnosis not present

## 2023-05-12 LAB — HM PAP SMEAR: HPV, high-risk: NEGATIVE

## 2023-05-14 DIAGNOSIS — N8501 Benign endometrial hyperplasia: Secondary | ICD-10-CM | POA: Diagnosis present
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
## 2023-05-15 ENCOUNTER — Encounter (HOSPITAL_COMMUNITY)
Admission: RE | Disposition: A | Discharge status: Home / Self Care | Payer: Self-pay | Source: Home / Self Care | Attending: Student

## 2023-05-15 ENCOUNTER — Other Ambulatory Visit: Payer: Self-pay

## 2023-05-15 ENCOUNTER — Encounter (HOSPITAL_COMMUNITY): Payer: Self-pay | Admitting: Student

## 2023-05-15 ENCOUNTER — Ambulatory Visit (HOSPITAL_COMMUNITY)

## 2023-05-15 ENCOUNTER — Ambulatory Visit (HOSPITAL_COMMUNITY)
Admission: RE | Admit: 2023-05-15 | Discharge: 2023-05-15 | Disposition: A | Discharge status: Home / Self Care | Attending: Student | Admitting: Student

## 2023-05-15 DIAGNOSIS — N939 Abnormal uterine and vaginal bleeding, unspecified: Secondary | ICD-10-CM | POA: Diagnosis not present

## 2023-05-15 DIAGNOSIS — Z90710 Acquired absence of both cervix and uterus: Secondary | ICD-10-CM | POA: Diagnosis present

## 2023-05-15 DIAGNOSIS — N8500 Endometrial hyperplasia, unspecified: Secondary | ICD-10-CM | POA: Diagnosis present

## 2023-05-15 DIAGNOSIS — Z01818 Encounter for other preprocedural examination: Secondary | ICD-10-CM

## 2023-05-15 DIAGNOSIS — I10 Essential (primary) hypertension: Secondary | ICD-10-CM | POA: Diagnosis not present

## 2023-05-15 DIAGNOSIS — N72 Inflammatory disease of cervix uteri: Secondary | ICD-10-CM | POA: Diagnosis not present

## 2023-05-15 DIAGNOSIS — G4733 Obstructive sleep apnea (adult) (pediatric): Secondary | ICD-10-CM | POA: Insufficient documentation

## 2023-05-15 DIAGNOSIS — D252 Subserosal leiomyoma of uterus: Secondary | ICD-10-CM | POA: Insufficient documentation

## 2023-05-15 DIAGNOSIS — N841 Polyp of cervix uteri: Secondary | ICD-10-CM | POA: Diagnosis not present

## 2023-05-15 DIAGNOSIS — N920 Excessive and frequent menstruation with regular cycle: Secondary | ICD-10-CM | POA: Diagnosis not present

## 2023-05-15 DIAGNOSIS — N838 Other noninflammatory disorders of ovary, fallopian tube and broad ligament: Secondary | ICD-10-CM | POA: Diagnosis not present

## 2023-05-15 DIAGNOSIS — N8501 Benign endometrial hyperplasia: Secondary | ICD-10-CM | POA: Diagnosis present

## 2023-05-15 DIAGNOSIS — K219 Gastro-esophageal reflux disease without esophagitis: Secondary | ICD-10-CM | POA: Insufficient documentation

## 2023-05-15 DIAGNOSIS — R7303 Prediabetes: Secondary | ICD-10-CM | POA: Insufficient documentation

## 2023-05-15 HISTORY — DX: Generalized anxiety disorder: F41.1

## 2023-05-15 HISTORY — DX: Obstructive sleep apnea (adult) (pediatric): G47.33

## 2023-05-15 HISTORY — PX: CYSTOSCOPY: SHX5120

## 2023-05-15 HISTORY — DX: Abnormal uterine and vaginal bleeding, unspecified: N93.9

## 2023-05-15 HISTORY — DX: Primary central sleep apnea: G47.31

## 2023-05-15 HISTORY — DX: Irritable bowel syndrome with constipation: K58.1

## 2023-05-15 HISTORY — DX: Prediabetes: R73.03

## 2023-05-15 HISTORY — DX: Insomnia, unspecified: G47.00

## 2023-05-15 HISTORY — DX: Major depressive disorder, single episode, unspecified: F32.9

## 2023-05-15 HISTORY — DX: Mixed hyperlipidemia: E78.2

## 2023-05-15 HISTORY — DX: Presence of spectacles and contact lenses: Z97.3

## 2023-05-15 HISTORY — DX: Other forms of dyspnea: R06.09

## 2023-05-15 HISTORY — DX: Other seasonal allergic rhinitis: J30.2

## 2023-05-15 HISTORY — DX: Endometrial hyperplasia, unspecified: N85.00

## 2023-05-15 LAB — POCT PREGNANCY, URINE: Preg Test, Ur: NEGATIVE

## 2023-05-15 SURGERY — HYSTERECTOMY, TOTAL, LAPAROSCOPIC, ROBOT-ASSISTED WITH SALPINGECTOMY
Anesthesia: General | Site: Pelvis

## 2023-05-15 MED ORDER — ONDANSETRON HCL 4 MG PO TABS: 4.0000 mg | ORAL_TABLET | Freq: Four times a day (QID) | ORAL | Status: DC | PRN

## 2023-05-15 MED ORDER — ROCURONIUM BROMIDE 10 MG/ML (PF) SYRINGE
PREFILLED_SYRINGE | INTRAVENOUS | Status: DC | PRN
  Administered 2023-05-15 (×2): 50 mg via INTRAVENOUS

## 2023-05-15 MED ORDER — FENTANYL CITRATE (PF) 100 MCG/2ML IJ SOLN
25.0000 ug | INTRAMUSCULAR | Status: DC | PRN
  Administered 2023-05-15: 25 ug via INTRAVENOUS

## 2023-05-15 MED ORDER — IBUPROFEN 200 MG PO TABS: 600.0000 mg | ORAL_TABLET | Freq: Four times a day (QID) | ORAL | Status: DC

## 2023-05-15 MED ORDER — FENTANYL CITRATE (PF) 250 MCG/5ML IJ SOLN
INTRAMUSCULAR | Status: DC | PRN
  Administered 2023-05-15: 50 ug via INTRAVENOUS
  Administered 2023-05-15 (×2): 100 ug via INTRAVENOUS

## 2023-05-15 MED ORDER — DOCUSATE SODIUM 100 MG PO CAPS
100.0000 mg | ORAL_CAPSULE | Freq: Two times a day (BID) | ORAL | Status: DC
  Administered 2023-05-15: 100 mg via ORAL
  Filled 2023-05-15: qty 1

## 2023-05-15 MED ORDER — PROPOFOL 10 MG/ML IV BOLUS
INTRAVENOUS | Status: AC
  Filled 2023-05-15: qty 20

## 2023-05-15 MED ORDER — ONDANSETRON HCL 4 MG/2ML IJ SOLN
INTRAMUSCULAR | Status: DC | PRN
  Administered 2023-05-15: 4 mg via INTRAVENOUS

## 2023-05-15 MED ORDER — CHLORHEXIDINE GLUCONATE 0.12 % MT SOLN
OROMUCOSAL | Status: AC
  Filled 2023-05-15: qty 15

## 2023-05-15 MED ORDER — FLUORESCEIN SODIUM 10 % IV SOLN
INTRAVENOUS | Status: DC | PRN
Start: 2023-05-15 — End: 2023-05-15
  Administered 2023-05-15: 100 mg via INTRAVENOUS

## 2023-05-15 MED ORDER — SIMETHICONE 80 MG PO CHEW: 80.0000 mg | CHEWABLE_TABLET | Freq: Four times a day (QID) | ORAL | Status: DC

## 2023-05-15 MED ORDER — FENTANYL CITRATE (PF) 100 MCG/2ML IJ SOLN
INTRAMUSCULAR | Status: AC
  Administered 2023-05-15: 25 ug via INTRAVENOUS
  Filled 2023-05-15: qty 2

## 2023-05-15 MED ORDER — ROCURONIUM BROMIDE 10 MG/ML (PF) SYRINGE
PREFILLED_SYRINGE | INTRAVENOUS | Status: AC
  Filled 2023-05-15: qty 10

## 2023-05-15 MED ORDER — OXYCODONE HCL 5 MG PO TABS: 5.0000 mg | ORAL_TABLET | ORAL | Status: DC | PRN

## 2023-05-15 MED ORDER — KETOROLAC TROMETHAMINE 30 MG/ML IJ SOLN
INTRAMUSCULAR | Status: AC
  Filled 2023-05-15: qty 1

## 2023-05-15 MED ORDER — ALBUTEROL SULFATE (2.5 MG/3ML) 0.083% IN NEBU
3.0000 mL | INHALATION_SOLUTION | RESPIRATORY_TRACT | Status: DC | PRN
Start: 2023-05-15 — End: 2023-05-15

## 2023-05-15 MED ORDER — GABAPENTIN 300 MG PO CAPS
300.0000 mg | ORAL_CAPSULE | ORAL | Status: AC
  Administered 2023-05-15: 300 mg via ORAL
  Filled 2023-05-15: qty 1

## 2023-05-15 MED ORDER — SIMETHICONE 80 MG PO TABS: 80.0000 mg | ORAL_TABLET | Freq: Three times a day (TID) | ORAL | Status: DC

## 2023-05-15 MED ORDER — KETOROLAC TROMETHAMINE 30 MG/ML IJ SOLN
INTRAMUSCULAR | Status: DC | PRN
  Administered 2023-05-15: 30 mg via INTRAVENOUS

## 2023-05-15 MED ORDER — ACETAMINOPHEN 500 MG PO TABS
1000.0000 mg | ORAL_TABLET | ORAL | Status: AC
  Administered 2023-05-15: 1000 mg via ORAL

## 2023-05-15 MED ORDER — ACETAMINOPHEN 500 MG PO TABS
1000.0000 mg | ORAL_TABLET | Freq: Four times a day (QID) | ORAL | Status: DC
  Administered 2023-05-15: 1000 mg via ORAL
  Filled 2023-05-15: qty 2

## 2023-05-15 MED ORDER — ROSUVASTATIN CALCIUM 5 MG PO TABS: 5.0000 mg | ORAL_TABLET | Freq: Every day | ORAL | Status: DC

## 2023-05-15 MED ORDER — IBUPROFEN 800 MG PO TABS: 800.0000 mg | ORAL_TABLET | Freq: Three times a day (TID) | ORAL | 0 refills | Status: DC

## 2023-05-15 MED ORDER — LIDOCAINE 2% (20 MG/ML) 5 ML SYRINGE
INTRAMUSCULAR | Status: DC | PRN
  Administered 2023-05-15: 60 mg via INTRAVENOUS

## 2023-05-15 MED ORDER — POVIDONE-IODINE 10 % EX SWAB: 2.0000 | Freq: Once | CUTANEOUS | Status: DC

## 2023-05-15 MED ORDER — LINACLOTIDE 145 MCG PO CAPS
145.0000 ug | ORAL_CAPSULE | Freq: Every day | ORAL | Status: DC
  Filled 2023-05-15: qty 1

## 2023-05-15 MED ORDER — BUPIVACAINE HCL (PF) 0.25 % IJ SOLN
INTRAMUSCULAR | Status: AC
  Filled 2023-05-15: qty 30

## 2023-05-15 MED ORDER — CEFAZOLIN SODIUM-DEXTROSE 2-4 GM/100ML-% IV SOLN
INTRAVENOUS | Status: AC
  Filled 2023-05-15: qty 100

## 2023-05-15 MED ORDER — LACTATED RINGERS IV SOLN: INTRAVENOUS | Status: DC

## 2023-05-15 MED ORDER — DEXAMETHASONE SODIUM PHOSPHATE 10 MG/ML IJ SOLN
INTRAMUSCULAR | Status: DC | PRN
  Administered 2023-05-15: 10 mg via INTRAVENOUS

## 2023-05-15 MED ORDER — OXYCODONE HCL 5 MG/5ML PO SOLN: 5.0000 mg | Freq: Once | ORAL | Status: DC | PRN

## 2023-05-15 MED ORDER — OXYCODONE HCL 5 MG PO TABS: 5.0000 mg | ORAL_TABLET | ORAL | 0 refills | Status: DC | PRN

## 2023-05-15 MED ORDER — PANTOPRAZOLE SODIUM 40 MG PO TBEC: 40.0000 mg | DELAYED_RELEASE_TABLET | Freq: Every day | ORAL | Status: DC

## 2023-05-15 MED ORDER — BUPIVACAINE HCL (PF) 0.25 % IJ SOLN
INTRAMUSCULAR | Status: DC | PRN
  Administered 2023-05-15: 16 mL

## 2023-05-15 MED ORDER — MIDAZOLAM HCL 2 MG/2ML IJ SOLN
INTRAMUSCULAR | Status: DC | PRN
  Administered 2023-05-15: 2 mg via INTRAVENOUS

## 2023-05-15 MED ORDER — ORAL CARE MOUTH RINSE: 15.0000 mL | Freq: Once | OROMUCOSAL | Status: AC

## 2023-05-15 MED ORDER — SUGAMMADEX SODIUM 200 MG/2ML IV SOLN
INTRAVENOUS | Status: DC | PRN
  Administered 2023-05-15: 400 mg via INTRAVENOUS

## 2023-05-15 MED ORDER — ONDANSETRON HCL 4 MG/2ML IJ SOLN: 4.0000 mg | Freq: Four times a day (QID) | INTRAMUSCULAR | Status: DC | PRN

## 2023-05-15 MED ORDER — BREXPIPRAZOLE 1 MG PO TABS
1.0000 mg | ORAL_TABLET | Freq: Every morning | ORAL | Status: DC
  Filled 2023-05-15: qty 1

## 2023-05-15 MED ORDER — FLUORESCEIN SODIUM 10 % IV SOLN
INTRAVENOUS | Status: AC
  Filled 2023-05-15: qty 5

## 2023-05-15 MED ORDER — STERILE WATER FOR IRRIGATION IR SOLN
Status: DC | PRN
  Administered 2023-05-15: 1000 mL

## 2023-05-15 MED ORDER — DULOXETINE HCL 60 MG PO CPEP
60.0000 mg | ORAL_CAPSULE | Freq: Every day | ORAL | Status: DC
  Administered 2023-05-15: 60 mg via ORAL
  Filled 2023-05-15: qty 1

## 2023-05-15 MED ORDER — ACETAMINOPHEN 500 MG PO TABS
ORAL_TABLET | ORAL | Status: AC
  Filled 2023-05-15: qty 2

## 2023-05-15 MED ORDER — ACETAMINOPHEN 500 MG PO TABS: 1000.0000 mg | ORAL_TABLET | Freq: Three times a day (TID) | ORAL | 0 refills | Status: DC

## 2023-05-15 MED ORDER — SODIUM CHLORIDE 0.9 % IR SOLN
Status: DC | PRN
  Administered 2023-05-15: 1000 mL via INTRAVESICAL
  Administered 2023-05-15: 1000 mL

## 2023-05-15 MED ORDER — IRBESARTAN 75 MG PO TABS
75.0000 mg | ORAL_TABLET | Freq: Every day | ORAL | Status: DC
  Administered 2023-05-15: 75 mg via ORAL
  Filled 2023-05-15: qty 1

## 2023-05-15 MED ORDER — MONTELUKAST SODIUM 10 MG PO TABS: 10.0000 mg | ORAL_TABLET | Freq: Every day | ORAL | Status: DC

## 2023-05-15 MED ORDER — DIPHENHYDRAMINE HCL 25 MG PO CAPS: 25.0000 mg | ORAL_CAPSULE | Freq: Every day | ORAL | Status: DC

## 2023-05-15 MED ORDER — ONDANSETRON HCL 4 MG/2ML IJ SOLN
INTRAMUSCULAR | Status: AC
  Filled 2023-05-15: qty 2

## 2023-05-15 MED ORDER — CEFAZOLIN SODIUM-DEXTROSE 2-4 GM/100ML-% IV SOLN
2.0000 g | INTRAVENOUS | Status: AC
Start: 2023-05-15 — End: 2023-05-15
  Administered 2023-05-15: 2 g via INTRAVENOUS

## 2023-05-15 MED ORDER — OXYCODONE HCL 5 MG PO TABS: 5.0000 mg | ORAL_TABLET | Freq: Once | ORAL | Status: DC | PRN

## 2023-05-15 MED ORDER — KETOROLAC TROMETHAMINE 30 MG/ML IJ SOLN
30.0000 mg | Freq: Four times a day (QID) | INTRAMUSCULAR | Status: DC
  Administered 2023-05-15: 30 mg via INTRAVENOUS
  Filled 2023-05-15: qty 1

## 2023-05-15 MED ORDER — LIDOCAINE 2% (20 MG/ML) 5 ML SYRINGE
INTRAMUSCULAR | Status: AC
  Filled 2023-05-15: qty 5

## 2023-05-15 MED ORDER — PROPOFOL 10 MG/ML IV BOLUS
INTRAVENOUS | Status: DC | PRN
Start: 2023-05-15 — End: 2023-05-15
  Administered 2023-05-15: 150 mg via INTRAVENOUS

## 2023-05-15 MED ORDER — MIDAZOLAM HCL 2 MG/2ML IJ SOLN
INTRAMUSCULAR | Status: AC
  Filled 2023-05-15: qty 2

## 2023-05-15 MED ORDER — DEXAMETHASONE SODIUM PHOSPHATE 10 MG/ML IJ SOLN
INTRAMUSCULAR | Status: AC
  Filled 2023-05-15: qty 1

## 2023-05-15 MED ORDER — CHLORHEXIDINE GLUCONATE 0.12 % MT SOLN
15.0000 mL | Freq: Once | OROMUCOSAL | Status: AC
  Administered 2023-05-15: 15 mL via OROMUCOSAL

## 2023-05-15 MED ORDER — FENTANYL CITRATE (PF) 250 MCG/5ML IJ SOLN
INTRAMUSCULAR | Status: AC
  Filled 2023-05-15: qty 5

## 2023-05-15 MED ORDER — HYDROMORPHONE HCL 1 MG/ML IJ SOLN: 0.2000 mg | INTRAMUSCULAR | Status: DC | PRN

## 2023-05-15 MED ORDER — BUPROPION HCL ER (XL) 300 MG PO TB24
300.0000 mg | ORAL_TABLET | Freq: Every day | ORAL | Status: DC
Start: 2023-05-16 — End: 2023-05-15

## 2023-05-15 SURGICAL SUPPLY — 62 items
APPLICATOR ARISTA FLEXITIP XL (MISCELLANEOUS) IMPLANT
CANNULA CAP OBTURATR AIRSEAL 8 (CAP) ×3 IMPLANT
CATH FOLEY 3WAY 5CC 16FR (CATHETERS) ×3 IMPLANT
COVER BACK TABLE 60X90IN (DRAPES) ×3 IMPLANT
COVER TIP SHEARS 8 DVNC (MISCELLANEOUS) ×3 IMPLANT
DEFOGGER SCOPE WARMER CLEARIFY (MISCELLANEOUS) ×3 IMPLANT
DERMABOND ADVANCED .7 DNX12 (GAUZE/BANDAGES/DRESSINGS) ×3 IMPLANT
DRAPE ARM DVNC X/XI (DISPOSABLE) ×12 IMPLANT
DRAPE COLUMN DVNC XI (DISPOSABLE) ×3 IMPLANT
DRAPE SURG IRRIG POUCH 19X23 (DRAPES) ×3 IMPLANT
DRAPE UTILITY XL STRL (DRAPES) ×3 IMPLANT
DRIVER NDL MEGA SUTCUT DVNCXI (INSTRUMENTS) ×3 IMPLANT
DRIVER NDLE MEGA SUTCUT DVNCXI (INSTRUMENTS) ×2 IMPLANT
DURAPREP 26ML APPLICATOR (WOUND CARE) ×3 IMPLANT
ELECT REM PT RETURN 9FT ADLT (ELECTROSURGICAL) ×2 IMPLANT
ELECTRODE REM PT RTRN 9FT ADLT (ELECTROSURGICAL) ×3 IMPLANT
FORCEPS BPLR FENES DVNC XI (FORCEP) ×3 IMPLANT
FORCEPS PROGRASP DVNC XI (FORCEP) IMPLANT
GAUZE 4X4 16PLY ~~LOC~~+RFID DBL (SPONGE) IMPLANT
GLOVE BIOGEL PI IND STRL 6 (GLOVE) ×9 IMPLANT
GLOVE BIOGEL PI MICRO STRL 5.5 (GLOVE) ×9 IMPLANT
HEMOSTAT ARISTA ABSORB 3G PWDR (HEMOSTASIS) IMPLANT
HOLDER FOLEY CATH W/STRAP (MISCELLANEOUS) IMPLANT
IRRIG SUCT STRYKERFLOW 2 WTIP (MISCELLANEOUS) ×2 IMPLANT
IRRIGATION SUCT STRKRFLW 2 WTP (MISCELLANEOUS) ×3 IMPLANT
KIT PINK PAD W/HEAD ARE REST (MISCELLANEOUS) ×2 IMPLANT
KIT PINK PAD W/HEAD ARM REST (MISCELLANEOUS) ×3 IMPLANT
KIT TURNOVER KIT B (KITS) ×3 IMPLANT
LEGGING LITHOTOMY PAIR STRL (DRAPES) ×3 IMPLANT
MANIFOLD NEPTUNE II (INSTRUMENTS) ×3 IMPLANT
MANIPULATOR ADVINCU DEL 2.5 PL (MISCELLANEOUS) IMPLANT
MANIPULATOR ADVINCU DEL 3.0 PL (MISCELLANEOUS) IMPLANT
MANIPULATOR ADVINCU DEL 3.5 PL (MISCELLANEOUS) IMPLANT
MANIPULATOR ADVINCU DEL 4.0 PL (MISCELLANEOUS) IMPLANT
NDL INSUFFLATION 14GA 120MM (NEEDLE) ×3 IMPLANT
NEEDLE INSUFFLATION 14GA 120MM (NEEDLE) ×2 IMPLANT
OBTURATOR OPTICAL STND 8 DVNC (TROCAR) ×2 IMPLANT
OBTURATOR OPTICALSTD 8 DVNC (TROCAR) ×3 IMPLANT
OCCLUDER COLPOPNEUMO (BALLOONS) IMPLANT
PACK ROBOT WH (CUSTOM PROCEDURE TRAY) ×3 IMPLANT
PACK ROBOTIC GOWN (GOWN DISPOSABLE) ×3 IMPLANT
PAD OB MATERNITY 11 LF (PERSONAL CARE ITEMS) ×3 IMPLANT
RTRCTR WOUND ALEXIS 18CM SML (INSTRUMENTS) IMPLANT
SAVER CELL AAL HAEMONETICS (INSTRUMENTS) IMPLANT
SCISSORS MNPLR CVD DVNC XI (INSTRUMENTS) ×3 IMPLANT
SEAL UNIV 5-12 XI (MISCELLANEOUS) ×6 IMPLANT
SEALER VESSEL EXT DVNC XI (MISCELLANEOUS) IMPLANT
SET IRRIG Y TYPE TUR BLADDER L (SET/KITS/TRAYS/PACK) IMPLANT
SET TRI-LUMEN FLTR TB AIRSEAL (TUBING) IMPLANT
SET TUBE FILTERED XL AIRSEAL (SET/KITS/TRAYS/PACK) ×3 IMPLANT
SLEEVE SCD COMPRESS KNEE MED (STOCKING) ×3 IMPLANT
SPIKE FLUID TRANSFER (MISCELLANEOUS) ×6 IMPLANT
SUT PDS AB 0 CT1 27 (SUTURE) IMPLANT
SUT VIC AB 0 CT1 27XBRD ANBCTR (SUTURE) IMPLANT
SUT VIC AB 4-0 PS2 18 (SUTURE) ×3 IMPLANT
SUT VICRYL 0 UR6 27IN ABS (SUTURE) IMPLANT
SUT VLOC 180 0 9IN GS21 (SUTURE) ×3 IMPLANT
TOWEL GREEN STERILE (TOWEL DISPOSABLE) ×3 IMPLANT
TROCAR PORT AIRSEAL 8X100 (TROCAR) IMPLANT
TROCAR Z-THREAD FIOS 5X100MM (TROCAR) IMPLANT
UNDERPAD 30X36 HEAVY ABSORB (UNDERPADS AND DIAPERS) ×3 IMPLANT
WATER STERILE IRR 1000ML POUR (IV SOLUTION) ×3 IMPLANT

## 2023-05-15 NOTE — Anesthesia Procedure Notes (Signed)
 Procedure Name: Intubation Date/Time: 05/15/2023 8:52 AM  Performed by: [address] Blaine Guiffre A, CRNAPre-anesthesia Checklist: Emergency Drugs available, Patient identified, Suction available, Patient being monitored and Timeout performed Patient Re-evaluated:Patient Re-evaluated prior to induction Oxygen Delivery Method: Circle system utilized Preoxygenation: Pre-oxygenation with 100% oxygen Induction Type: IV induction Laryngoscope Size: Mac and 4 Grade View: Grade II Tube size: 7.5 mm Number of attempts: 1 Placement Confirmation: ETT inserted through vocal cords under direct vision, positive ETCO2, CO2 detector and breath sounds checked- equal and bilateral Secured at: 21 cm Tube secured with: Tape

## 2023-05-15 NOTE — Interval H&P Note (Signed)
 History and Physical Interval Note:  05/15/2023 7:58 AM  Samantha Greene  has presented today for surgery, with the diagnosis of abnormal bleeding.  The various methods of treatment have been discussed with the patient and family. After consideration of risks, benefits and other options for treatment, the patient has consented to  Procedure(s) with comments: HYSTERECTOMY, TOTAL, LAPAROSCOPIC, ROBOT-ASSISTED WITH SALPINGECTOMY (Bilateral) - OR Time 2.5hrs  and CPT S2900 CYSTOSCOPY (N/A) as a surgical intervention.  The patient's history has been reviewed, patient examined, no change in status, stable for surgery.  I have reviewed the patient's chart and labs.  Questions were answered to the patient's satisfaction.     Leanne Pronto

## 2023-05-15 NOTE — Anesthesia Preprocedure Evaluation (Signed)
 Anesthesia Evaluation  Patient identified by MRN, date of birth, ID band Patient awake    Reviewed: Allergy & Precautions, H&P , NPO status , Patient's Chart, lab work & pertinent test results  Airway Mallampati: II   Neck ROM: full    Dental   Pulmonary shortness of breath, sleep apnea    breath sounds clear to auscultation       Cardiovascular hypertension,  Rhythm:regular Rate:Normal     Neuro/Psych  PSYCHIATRIC DISORDERS Anxiety Depression     Neuromuscular disease    GI/Hepatic ,GERD  ,,  Endo/Other    Renal/GU      Musculoskeletal  (+) Arthritis ,    Abdominal   Peds  Hematology   Anesthesia Other Findings   Reproductive/Obstetrics                             Anesthesia Physical Anesthesia Plan  ASA: 2  Anesthesia Plan: General   Post-op Pain Management:    Induction: Intravenous  PONV Risk Score and Plan: 3 and Ondansetron, Dexamethasone, Midazolam and Treatment may vary due to age or medical condition  Airway Management Planned: Oral ETT  Additional Equipment:   Intra-op Plan:   Post-operative Plan: Extubation in OR  Informed Consent: I have reviewed the patients History and Physical, chart, labs and discussed the procedure including the risks, benefits and alternatives for the proposed anesthesia with the patient or authorized representative who has indicated his/her understanding and acceptance.     Dental advisory given  Plan Discussed with: CRNA, Anesthesiologist and Surgeon  Anesthesia Plan Comments:        Anesthesia Quick Evaluation

## 2023-05-15 NOTE — Progress Notes (Signed)
*   Day of Surgery * Procedure(s) (LRB): HYSTERECTOMY, TOTAL, LAPAROSCOPIC, ROBOT-ASSISTED WITH SALPINGECTOMY (Bilateral) CYSTOSCOPY (N/A)  Subjective: Doing well. Pain controlled, 3/10. Tolerated lunch. Voided. Ambulating well.     05/15/2023   12:45 PM 05/15/2023   12:00 PM 05/15/2023   11:45 AM  Vitals with BMI  Systolic 148 158 161  Diastolic 98 82 83  Pulse 92 94 87    Objective: I have reviewed patient's vital signs, intake and output, and medications.  General: Alert, pleasant, NAD. Obese Resp: Aerating well, no distress Abdomen: soft, mild tenderness. Incisions intact Extremities: Moves all, non-tender  Assessment: 53 yo s/p Procedure(s) with comments: HYSTERECTOMY, TOTAL, LAPAROSCOPIC, ROBOT-ASSISTED WITH SALPINGECTOMY (Bilateral) - CPT S2900 CYSTOSCOPY (N/A) Doing well, meeting goals. Desires DC home today   LOS: 0 days    Leanne Pronto, DO 05/15/2023, 4:57 PM

## 2023-05-15 NOTE — Op Note (Signed)
 Operative Note  Date: 05/15/23  Preoperative Diagnosis: 1) Abnormal uterine bleeding 2) Endometrial hyperplasia without atypia  Postoperative Diagnosis:  1) Abnormal uterine bleeding 2) Endometrial hyperplasia without atypia 3) Right paratubal cyst  Procedure: Robotic-assisted total laparoscopic hysterectomy with bilateral salpingectomy, GYN cystoscopy Surgeon: Rossie Coon, DO Assist: Rand Burrs, MD    An experienced assistant was required given the standard of surgical care given the complexity of the case and maternal body habitus.  This assistant was needed for exposure, dissection, suctioning, retraction, instrument exchange, and for overall help during the procedure.    Operative Findings: Normal vulva and vagina. Multiparous cervix with cervical fibroid. Anteverted uterus sounding to cm. Normal left fallopian tube and ovary. Right paratubal cyst. Normal upper abdominal survey. Intact bladder dome with bilateral ureteral efflux Specimens: Uterus, cervix, bilateral fallopian tubes  EBL 50 IVF 1L UOP 200   Surgical Risks:  The patient was informed of the risks and benefits of robotic-assisted laparoscopic hysterectomy with bilateral salpingo- oophorectomy.  Risks included, but were not limited to bleeding, infection, injury to the internal organs and tissues.  The patient was counseled extensively about the possibility of laparotomy if visualization was hampered.  The patient expressed understanding of all risks involved with the procedure. All questions were answered and the patient consented to the procedure.  Description of the Procedure:  The patient was taken to the operating room, where a time-out was performed to confirm the correct patient and correct procedure. The patient was administered preoperative prophylactic IV antibiotics.  General anesthesia was induced.   The patient was then positioned on the operating table in the dorsal lithotomy position with the legs supported  using stirrups. All pressure points were padded and a Bair Hugger was placed to maintain control of core body temperature.  The patient was prepped and draped in the usual sterile fashion.  Operative Technique:  Attention was directed to the perineum, where a Foley catheter was inserted into the bladder.   A bivalve speculum was inserted into the vagina.  The anterior lip of the cervix was visualized and grasped using a single-tooth tenaculum.  The cervix was adequately dilated for the introduction of the uterine manipulator to be inserted through the cervix.  Following placement of the uterine manipulator, the speculum was removed from the vagina. The tenaculum was removed from the cervix. Sterile gown and gloves were changed.  Attention was then turned to the abdomen where a horizontal incision was performed approximately 5 cm superior to the umbilicus.  The Veress needle was introduced into the peritoneum.  Placement of the Veress needle was confirmed using normal saline solution.  Pneumoperitoneum was then established with carbon dioxide.  The Veress needle was removed.  An 8-mm trocar and sleeve were inserted through the incision into the peritoneum. Laparoscopic visualization confirmed the intraperitoneal insertion of the port. Pneumoperitoneum was then established and maintained using carbon dioxide.  A 2nd horizontal incision was made 8 cm to the left of the midline, and an 8-mm trocar was introduced under direct visualization.  A 3rd small horizontal incision was made 8 cm to the right of the midline, and an 8-mm trocar was introduced under direct visualization.  The abdomen was then thoroughly inspected and there was no evidence of injury to the bowel, bladder, or vasculature.The uterus and bilateral ovaries and fallopian tubes were freely mobile. Otherwise, the abdominal cavity appeared normal including the appendix.  The patient was placed in the Trendelenburg position. The robotic device was  then docked.  The ureters were identified. The fallopian tubes were elevated and the mesosalpinx cut from lateral to medical using monopolar scissors. The round ligaments were then cauterized and cut with monopolar scissors  The round ligament incisions were extended into the posterior leaf of the broad ligament.   Attention was then turned to the contralateral side where the identical procedure was performed.  The midline reflection of the bladder and peritoneum to the uterus was then freed by extending the incisions of the anterior leaf of the broad ligament using the monopolar scissors.  The bladder was separated from the lower uterine segment and upper cervix using careful sharp dissection with monopolar scissors.  Mobilization of the bladder away from the cervix and upper anterior vaginal wall was performed.  The posterior leaf of the broad ligament was cut on either side parallel with the lateral side of the uterus, down to the point of the uterosacral ligaments behind the cervix.  This allowed for exposure of the uterine vessels.  The uterine corpus was retracted to expose the uterine vessels.  Using the bipolar fenestrated grasper, the uterine vessels were bilaterally coagulated at the level of the internal os at right angles to the lower uterine segment. After adequate coagulation, the vessels were transected using monopolar scissors.  The uterus and cervix were then amputated from the vagina using monopolar electrocautery.  The uterus was easily removed through the vagina.   Both specimens were sent to Pathology for permanent investigation.  The vaginal cuff was closed 0 V-loc suture in a running non-locked fashion. Good hemostasis was noted.  The abdomen was irrigated and good hemostasis was confirmed.  The instruments were removed from the abdomen.  Pneumoperitoneum was evacuated.  Attention was then turned to the perineum where the foley was removed and cytoscopy performed.   Gloves and  gown were again changed and the laparoscopic skin incisions were closed using 4-0 Vicryl in a subcuticular stitch.  The incisions were reinforced with Steri-Strips. All needle, sponge, and instrument counts were noted to be correct x2 at the end of the procedure.  The patient tolerated the procedure well and was transferred to the recovery room in stable condition.   Jeneane Miracle

## 2023-05-15 NOTE — Discharge Instructions (Addendum)
 Call office with any concerns [phone]

## 2023-05-15 NOTE — Transfer of Care (Signed)
 Immediate Anesthesia Transfer of Care Note  Patient: Samantha Greene  Procedure(s) Performed: HYSTERECTOMY, TOTAL, LAPAROSCOPIC, ROBOT-ASSISTED WITH SALPINGECTOMY (Bilateral: Pelvis) CYSTOSCOPY (Bladder)  Patient Location: PACU  Anesthesia Type:General  Level of Consciousness: alert   Airway & Oxygen Therapy: Patient Spontanous Breathing  Post-op Assessment: Report given to RN  Post vital signs: stable  Last Vitals:  Vitals Value Taken Time  BP 144/81 05/15/23 1046  Temp 36.4 C 05/15/23 1043  Pulse 93 05/15/23 1049  Resp 19 05/15/23 1049  SpO2 93 % 05/15/23 1049  Vitals shown include unfiled device data.  Last Pain:  Vitals:   05/15/23 1043  TempSrc:   PainSc: 3       Patients Stated Pain Goal: 5 (05/15/23 5784)  Complications: No notable events documented.

## 2023-05-15 NOTE — Discharge Summary (Signed)
 Physician Discharge Summary  Patient ID: Samantha Greene MRN: [id number] DOB/AGE: [birth year] 53 y.o.  Admit date: 05/15/2023 Discharge date: 05/15/2023  Admission Diagnoses:  Discharge Diagnoses:  Principal Problem:   Endometrial hyperplasia without atypia Active Problems:   Endometrial hyperplasia without atypia, simple   S/P total hysterectomy   Discharged Condition: good  Hospital Course: 53 yo presented for scheduled robotic hysterectomy with bilateral salpingectomy for endometrial hyperplasia without atypia. She is meeting post-operative goals and is discharged home in good condition  Discharge Exam: Blood pressure (!) 148/98, pulse 92, temperature 98.9 F (37.2 C), resp. rate 20, height 5\' 1"  (1.549 m), weight 101.2 kg, SpO2 96%. Gen: alert, pleasant, NAD. Obese Resp: aerating well, no distress Abd: soft, mildly tender. Non-distended. Incisions intact Extremities: trace edema, non-tender  Disposition: Discharge disposition: 01-Home or Self Care       Discharge Instructions     Call MD for:   Complete by: As directed    Unable to void or have bowel movement   Call MD for:  redness, tenderness, or signs of infection (pain, swelling, redness, odor or green/yellow discharge around incision site)   Complete by: As directed    Call MD for:  severe uncontrolled pain   Complete by: As directed    Call MD for:  temperature >100.4   Complete by: As directed    Diet general   Complete by: As directed    Discharge instructions   Complete by: As directed    Call office with any concerns [phone]   Discharge wound care:   Complete by: As directed    Keep incisions clean and dry. OK to shower tomorrow   Driving Restrictions   Complete by: As directed    2 weeks or while taking narcotics   Increase activity slowly   Complete by: As directed    Lifting restrictions   Complete by: As directed    No lifting greater than 15 lbs for 6 weeks   May shower / Bathe    Complete by: As directed    Tomorrow   Sexual Activity Restrictions   Complete by: As directed    Nothing in the vagina for 6 weeks      Allergies as of 05/15/2023       Reactions   Doxycycline Shortness Of Breath, Anxiety   Erythromycin Nausea Only, Other (See Comments)   Stomach cramping (can take Z Pak)   Flexeril [cyclobenzaprine Hcl] Other (See Comments)   "don't like the way I feel"   Metformin And Related Other (See Comments)   GI Upset        Medication List     STOP taking these medications    meloxicam 15 MG tablet Commonly known as: MOBIC   norethindrone 5 MG tablet Commonly known as: AYGESTIN       TAKE these medications    acetaminophen 500 MG tablet Commonly known as: TYLENOL Take 2 tablets (1,000 mg total) by mouth 3 (three) times daily before meals. What changed:  when to take this reasons to take this   albuterol 108 (90 Base) MCG/ACT inhaler Commonly known as: VENTOLIN HFA INHALE 2 PUFFS INTO THE LUNGS EVERY 4 HOURS AS NEEDED FOR WHEEZING OR SHORTNESS OF BREATH.   aspirin EC 81 MG tablet Take 81 mg by mouth in the morning. Swallow whole.   BIOTIN PO Take 2 each by mouth in the morning. Gummies (unsure dosage)   brexpiprazole 1 MG Tabs tablet Commonly known  as: REXULTI Take 1 mg by mouth in the morning.   buPROPion 300 MG 24 hr tablet Commonly known as: WELLBUTRIN XL TAKE 1 TABLET EVERY MORNING FOR MOOD, FOCUS & CONCENTRATION What changed: See the new instructions.   cetirizine 10 MG tablet Commonly known as: ZYRTEC Take 10 mg by mouth at bedtime.   docusate sodium 100 MG capsule Commonly known as: COLACE Take 300 mg by mouth 2 (two) times daily.   DULoxetine 60 MG capsule Commonly known as: CYMBALTA TAKE 1 CAPSULE BY MOUTH EVERY DAY   ibuprofen 800 MG tablet Commonly known as: ADVIL Take 1 tablet (800 mg total) by mouth 3 (three) times daily before meals.   Linzess 145 MCG Caps capsule Generic drug: linaclotide Take  145 mcg by mouth daily before breakfast.   montelukast 10 MG tablet Commonly known as: SINGULAIR TAKE 1 TABLET BY MOUTH DAILY FOR ALLERGIES What changed: See the new instructions.   multivitamin tablet Take 1 tablet by mouth daily.   omeprazole 40 MG capsule Commonly known as: PRILOSEC Take 1 capsule (40 mg total) by mouth daily. TAKE 1 CAPSULE BY MOUTH EVERY DAY   oxyCODONE 5 MG immediate release tablet Commonly known as: Oxy IR/ROXICODONE Take 1 tablet (5 mg total) by mouth every 4 (four) hours as needed for moderate pain (pain score 4-6).   rosuvastatin 5 MG tablet Commonly known as: CRESTOR TAKE 1 TABLET BY MOUTH EVERY DAY FOR CHOLESTEROL What changed: See the new instructions.   SEMAGLUTIDE-WEIGHT MANAGEMENT  Inject 2.5 mg into the skin every Monday. Compounded via [facility]   Simethicone 80 MG Tabs Take 1 tablet (80 mg total) by mouth in the morning, at noon, and at bedtime for 5 days.   telmisartan 40 MG tablet Commonly known as: MICARDIS TAKE 1 TABLET BY MOUTH EVERY DAY FOR BLOOD PRESSURE   tizanidine 6 MG capsule Commonly known as: ZANAFLEX Take 1 capsule (6 mg total) by mouth 3 (three) times daily as needed for muscle spasms. What changed: when to take this   Vitamin D 125 MCG (5000 UT) Caps Take 10,000 Units by mouth in the morning.   ZzzQuil 25 MG Caps Generic drug: diphenhydrAMINE HCl (Sleep) Take 1 capsule by mouth at bedtime.               Discharge Care Instructions  (From admission, onward)           Start     Ordered   05/15/23 0000  Discharge wound care:       Comments: Keep incisions clean and dry. OK to shower tomorrow   05/15/23 1655            Follow-up Information     Samantha Miracle A, DO. Go in 2 week(s).   Specialty: Obstetrics and Gynecology Why: For wound re-check Contact information: [address] [place] [address] [place] [address] [phone]                 Signed: Leanne Pronto 05/15/2023, 5:05 PM

## 2023-05-16 ENCOUNTER — Encounter (HOSPITAL_COMMUNITY): Payer: Self-pay | Admitting: Student

## 2023-05-16 LAB — SURGICAL PATHOLOGY

## 2023-05-17 NOTE — Anesthesia Postprocedure Evaluation (Signed)
 Anesthesia Post Note  Patient: Samantha Greene  Procedure(s) Performed: HYSTERECTOMY, TOTAL, LAPAROSCOPIC, ROBOT-ASSISTED WITH SALPINGECTOMY (Bilateral: Pelvis) CYSTOSCOPY (Bladder)     Patient location during evaluation: PACU Anesthesia Type: General Level of consciousness: awake and alert Pain management: pain level controlled Vital Signs Assessment: post-procedure vital signs reviewed and stable Respiratory status: spontaneous breathing, nonlabored ventilation, respiratory function stable and patient connected to nasal cannula oxygen Cardiovascular status: blood pressure returned to baseline and stable Postop Assessment: no apparent nausea or vomiting Anesthetic complications: no   No notable events documented.  Last Vitals:  Vitals:   05/15/23 1245 05/15/23 1500  BP: (!) 148/98 (!) 146/90  Pulse: 92 94  Resp: 20 18  Temp: 37.2 C 37 C  SpO2: 96% 96%    Last Pain:  Vitals:   05/15/23 1343  TempSrc:   PainSc: 3                  Jemarion Roycroft S

## 2023-06-22 DIAGNOSIS — F3341 Major depressive disorder, recurrent, in partial remission: Secondary | ICD-10-CM | POA: Diagnosis not present

## 2023-06-22 DIAGNOSIS — F419 Anxiety disorder, unspecified: Secondary | ICD-10-CM | POA: Diagnosis not present

## 2023-06-23 ENCOUNTER — Encounter (HOSPITAL_COMMUNITY): Payer: Self-pay | Admitting: Gastroenterology

## 2023-06-23 NOTE — Progress Notes (Signed)
 Attempted to obtain medical history for pre op call via telephone, unable to reach at this time. HIPAA compliant voicemail message left requesting return call to pre surgical testing department.

## 2023-06-29 ENCOUNTER — Encounter (HOSPITAL_COMMUNITY): Payer: Self-pay | Admitting: Gastroenterology

## 2023-06-29 NOTE — Anesthesia Preprocedure Evaluation (Signed)
 Anesthesia Evaluation  Patient identified by MRN, date of birth, ID band Patient awake    Reviewed: Allergy  & Precautions, NPO status , Patient's Chart, lab work & pertinent test results  History of Anesthesia Complications Negative for: history of anesthetic complications  Airway Mallampati: III  TM Distance: >3 FB Neck ROM: Full   Comment: Previous grade II view with MAC 4 Dental  (+) Dental Advisory Given   Pulmonary neg shortness of breath, sleep apnea (2L), Continuous Positive Airway Pressure Ventilation and Oxygen  sleep apnea , neg COPD, neg recent URI   Pulmonary exam normal breath sounds clear to auscultation       Cardiovascular hypertension (telmisartan ), Pt. on medications (-) angina (-) Past MI, (-) Cardiac Stents and (-) CABG (-) dysrhythmias  Rhythm:Regular Rate:Normal  HLD  TTE 09/18/2018: IMPRESSIONS    1. The average left ventricular global longitudinal strain is normal at  -28.5 %.   2. The left ventricle has hyperdynamic systolic function, with an  ejection fraction of >65%. The cavity size was normal. Left ventricular  diastolic parameters were normal.   3. The right ventricle has normal systolic function. The cavity was  normal. There is no increase in right ventricular wall thickness.   4. The aorta is normal unless otherwise noted.     Neuro/Psych  PSYCHIATRIC DISORDERS Anxiety Depression    negative neurological ROS     GI/Hepatic Neg liver ROS, Bowel prep,GERD  Medicated,,  Endo/Other    Class 3 obesityPre-diabetes  Renal/GU negative Renal ROS     Musculoskeletal  (+) Arthritis ,    Abdominal  (+) + obese  Peds  Hematology negative hematology ROS (+) Lab Results      Component                Value               Date                      WBC                      6.3                 05/09/2023                HGB                      13.7                05/09/2023                HCT                       41.7                05/09/2023                MCV                      98.6                05/09/2023                PLT                      337  05/09/2023              Anesthesia Other Findings Last semaglutide : 06/04/2023  Reproductive/Obstetrics                             Anesthesia Physical Anesthesia Plan  ASA: 3  Anesthesia Plan: MAC   Post-op Pain Management: Minimal or no pain anticipated   Induction: Intravenous  PONV Risk Score and Plan: 2 and Propofol  infusion, TIVA and Treatment may vary due to age or medical condition  Airway Management Planned: Natural Airway and Simple Face Mask  Additional Equipment:   Intra-op Plan:   Post-operative Plan: Extubation in OR  Informed Consent: I have reviewed the patients History and Physical, chart, labs and discussed the procedure including the risks, benefits and alternatives for the proposed anesthesia with the patient or authorized representative who has indicated his/her understanding and acceptance.     Dental advisory given  Plan Discussed with: CRNA and Anesthesiologist  Anesthesia Plan Comments: (Discussed with patient risks of MAC including, but not limited to, minor pain or discomfort, hearing people in the room, and possible need for backup general anesthesia. Risks for general anesthesia also discussed including, but not limited to, sore throat, hoarse voice, chipped/damaged teeth, injury to vocal cords, nausea and vomiting, allergic reactions, lung infection, heart attack, stroke, and death. All questions answered. )       Anesthesia Quick Evaluation

## 2023-06-30 ENCOUNTER — Ambulatory Visit (HOSPITAL_COMMUNITY): Payer: Self-pay | Admitting: Anesthesiology

## 2023-06-30 ENCOUNTER — Encounter (HOSPITAL_COMMUNITY)
Admission: RE | Disposition: A | Discharge status: Home / Self Care | Payer: Self-pay | Source: Home / Self Care | Attending: Gastroenterology

## 2023-06-30 ENCOUNTER — Ambulatory Visit (HOSPITAL_COMMUNITY)
Admission: RE | Admit: 2023-06-30 | Discharge: 2023-06-30 | Disposition: A | Discharge status: Home / Self Care | Attending: Gastroenterology | Admitting: Gastroenterology

## 2023-06-30 ENCOUNTER — Encounter (HOSPITAL_COMMUNITY): Payer: Self-pay | Admitting: Gastroenterology

## 2023-06-30 ENCOUNTER — Other Ambulatory Visit: Payer: Self-pay

## 2023-06-30 DIAGNOSIS — K5900 Constipation, unspecified: Secondary | ICD-10-CM | POA: Diagnosis not present

## 2023-06-30 DIAGNOSIS — Z79899 Other long term (current) drug therapy: Secondary | ICD-10-CM | POA: Insufficient documentation

## 2023-06-30 DIAGNOSIS — I10 Essential (primary) hypertension: Secondary | ICD-10-CM | POA: Diagnosis not present

## 2023-06-30 DIAGNOSIS — Z800 Family history of malignant neoplasm of digestive organs: Secondary | ICD-10-CM | POA: Insufficient documentation

## 2023-06-30 DIAGNOSIS — G4733 Obstructive sleep apnea (adult) (pediatric): Secondary | ICD-10-CM | POA: Insufficient documentation

## 2023-06-30 DIAGNOSIS — Z1211 Encounter for screening for malignant neoplasm of colon: Secondary | ICD-10-CM | POA: Diagnosis not present

## 2023-06-30 HISTORY — PX: COLONOSCOPY: SHX5424

## 2023-06-30 SURGERY — COLONOSCOPY
Anesthesia: Monitor Anesthesia Care

## 2023-06-30 MED ORDER — SODIUM CHLORIDE 0.9 % IV SOLN
INTRAVENOUS | Status: AC | PRN
  Administered 2023-06-30: 500 mL via INTRAMUSCULAR

## 2023-06-30 MED ORDER — LIDOCAINE 2% (20 MG/ML) 5 ML SYRINGE
INTRAMUSCULAR | Status: DC | PRN
Start: 2023-06-30 — End: 2023-06-30
  Administered 2023-06-30: 60 mg via INTRAVENOUS

## 2023-06-30 MED ORDER — PROPOFOL 500 MG/50ML IV EMUL
INTRAVENOUS | Status: DC | PRN
  Administered 2023-06-30: 20 mg via INTRAVENOUS
  Administered 2023-06-30: 50 mg via INTRAVENOUS
  Administered 2023-06-30: 150 ug/kg/min via INTRAVENOUS

## 2023-06-30 MED ORDER — ONDANSETRON HCL 4 MG/2ML IJ SOLN
INTRAMUSCULAR | Status: DC | PRN
Start: 2023-06-30 — End: 2023-06-30
  Administered 2023-06-30: 4 mg via INTRAVENOUS

## 2023-06-30 NOTE — Discharge Instructions (Signed)
YOU HAD AN ENDOSCOPIC PROCEDURE TODAY: Refer to the procedure report and other information in the discharge instructions given to you for any specific questions about what was found during the examination. If this information does not answer your questions, please call [facility] at [phone] to clarify.   YOU SHOULD EXPECT: Some feelings of bloating in the abdomen. Passage of more gas than usual. Walking can help get rid of the air that was put into your GI tract during the procedure and reduce the bloating. If you had a lower endoscopy (such as a colonoscopy or flexible sigmoidoscopy) you may notice spotting of blood in your stool or on the toilet paper. Some abdominal soreness may be present for a day or two, also.  DIET: Your first meal following the procedure should be a light meal and then it is ok to progress to your normal diet. A half-sandwich or bowl of soup is an example of a good first meal. Heavy or fried foods are harder to digest and may make you feel nauseous or bloated. Drink plenty of fluids but you should avoid alcoholic beverages for 24 hours.  ACTIVITY: Your care partner should take you home directly after the procedure. You should plan to take it easy, moving slowly for the rest of the day. You can resume normal activity the day after the procedure however YOU SHOULD NOT DRIVE, use power tools, machinery or perform tasks that involve climbing or major physical exertion for 24 hours (because of the sedation medicines used during the test).   SYMPTOMS TO REPORT IMMEDIATELY: A gastroenterologist can be reached at any hour. Please call [phone]  for any of the following symptoms:  Following lower endoscopy (colonoscopy, flexible sigmoidoscopy) Excessive amounts of blood in the stool  Significant tenderness, worsening of abdominal pains  Swelling of the abdomen that is new, acute  Fever of 100 or higher    FOLLOW UP:  If any biopsies were taken you will be  contacted by phone or by letter within the next 1-3 weeks. Call [phone]  if you have not heard about the biopsies in 3 weeks.  Please also call with any specific questions about appointments or follow up tests.  

## 2023-06-30 NOTE — Anesthesia Postprocedure Evaluation (Signed)
 Anesthesia Post Note  Patient: Samantha Greene  Procedure(s) Performed: COLONOSCOPY     Patient location during evaluation: PACU Anesthesia Type: MAC Level of consciousness: awake Pain management: pain level controlled Vital Signs Assessment: post-procedure vital signs reviewed and stable Respiratory status: spontaneous breathing, nonlabored ventilation and respiratory function stable Cardiovascular status: stable and blood pressure returned to baseline Postop Assessment: no apparent nausea or vomiting Anesthetic complications: no   No notable events documented.  Last Vitals:  Vitals:   06/30/23 0800 06/30/23 0810  BP: 124/63 127/73  Pulse: 72 72  Resp: 17 17  Temp:    SpO2: 99% 92%    Last Pain:  Vitals:   06/30/23 0810  TempSrc:   PainSc: 0-No pain                 Conard Decent

## 2023-06-30 NOTE — H&P (Signed)
 Samantha Greene HPI: This 53 year old white female presents to the office for colorectal cancer screening. She has a history of constipation and can go 3 days without a BM. She takes 3-4 stool softeners, and 5 Metamucil gummies a day. She has 1 BM per day with no obvious blood or mucus in the stool. She started taking Semaglutide  for weight loss in November. She has noticed this has increased the constipation. She takes Omeprazole  40 mg daily for acid reflux. She has a good appetite. She reports she has loss 20 pounds since November 2024. She denies having any complaints of abdominal pain, nausea, vomiting, dysphagia or odynophagia. She denies having a family history of celiac sprue or IBD. She had 2 maternal aunts and a maternal uncle all diagnose with colon cancer, age of diagnosis is unknown. She is scheduled to have a hysterectomy May 15, 2023.   Past Medical History:  Diagnosis Date   Abnormal uterine bleeding (AUB)    Arthritis    Central sleep apnea syndrome    use 2L oxygen  thru cpap nightly   DOE (dyspnea on exertion)    05-05-2023  pt sob w/ stairs and long distance walking (recovers [place] when she stops)  but okay with household chores   Endometrial hyperplasia    Family history of breast cancer    Family history of colon cancer    Family history of lung cancer    Family history of pancreatic cancer    Family history of prostate cancer    GAD (generalized anxiety disorder)    GERD (gastroesophageal reflux disease)    History of kidney stones    Hyperlipidemia, mixed    Hypertension    Insomnia    Irritable bowel syndrome with constipation    GI-- dr Tova Fresh   MDD (major depressive disorder)    OSA on CPAP    followed by dr dohmeier;   HST in epic 11-07-2018 severe osa w/ central sleep apnea,  2L O2 added thru cpap 2021   Pre-diabetes    Seasonal allergies    Wears glasses     Past Surgical History:  Procedure Laterality Date   CYSTOSCOPY N/A 05/15/2023   Procedure:  CYSTOSCOPY;  Surgeon: Leanne Pronto, DO;  Location: [facility] OR;  Service: Gynecology;  Laterality: N/A;   DILATATION & CURETTAGE/HYSTEROSCOPY WITH MYOSURE N/A 10/18/2019   Procedure: DILATATION & CURETTAGE/HYSTEROSCOPY WITH MYOSURE;  Surgeon: Lavoie, Marie-Lyne, MD;  Location: [facility];  Service: Gynecology;  Laterality: N/A;  request 11:30am OR time requests one hour   DILATION AND CURETTAGE OF UTERUS     x3     1992;   1997;   1998   DILATION AND EVACUATION  07/15/2005   @[facility]  by dr Harless Lien;  missed ab   EPIGASTRIC HERNIA REPAIR N/A 03/23/2018   Procedure: PLICATION OF DIAPHRAGM;  Surgeon: Zelphia Higashi, MD;  Location: [facility] OR;  Service: Thoracic;  Laterality: N/A;   HYSTEROSCOPY WITH D & C  01/07/2011   @[facility]  by dr Jeannetta Millman;  polypectomy   IR THORACENTESIS ASP PLEURAL SPACE W/IMG GUIDE  04/19/2018   VIDEO ASSISTED THORACOSCOPY (VATS)/THOROCOTOMY Right 03/23/2018   Procedure: VIDEO ASSISTED THORACOSCOPY;  Surgeon: Zelphia Higashi, MD;  Location: [facility] OR;  Service: Thoracic;  Laterality: Right;    Family History  Problem Relation Age of Onset   Breast cancer Mother 59   Lung cancer Mother    Hypertension Mother    Diabetes Mother  Kidney disease Mother    Hyperlipidemia Mother    Congestive Heart Failure Mother    Colon polyps Mother        "multiple"   Hyperlipidemia Brother    Hypertension Brother    Diabetes Brother    Breast cancer Maternal Aunt        dx 50+   Diabetes Maternal Aunt    Hyperlipidemia Maternal Aunt    Heart disease Maternal Aunt    Hypertension Maternal Aunt    Stroke Maternal Aunt    Colon cancer Maternal Aunt        dx 50+   Colon polyps Maternal Aunt        multiple   Heart attack Maternal Aunt    Colon cancer Maternal Aunt        dx 50+   Stroke Maternal Aunt    Atrial fibrillation Maternal Aunt    Colon cancer Maternal Uncle        dx 50+   Diabetes Maternal Uncle    Hyperlipidemia Maternal Uncle    Heart  disease Maternal Uncle    Hypertension Maternal Uncle    Stroke Maternal Uncle    Colon cancer Maternal Uncle        dx 50+   Prostate cancer Maternal Uncle        dx 50+   Polycystic ovary syndrome Paternal Aunt    Ovarian cancer Paternal Aunt    Cervical cancer Paternal Aunt    Hemochromatosis Paternal Uncle    Heart disease Maternal Grandmother        Needed pacemaker   Breast cancer Cousin        double mastectomy   Prostate cancer Cousin    Prostate cancer Cousin     Social History:  reports that she has never smoked. She has never used smokeless tobacco. She reports that she does not currently use alcohol. She reports that she does not currently use drugs.  Allergies:  Allergies  Allergen Reactions   Doxycycline  Shortness Of Breath and Anxiety   Erythromycin Nausea Only and Other (See Comments)    Stomach cramping (can take Z Pak)   Flexeril  [Cyclobenzaprine  Hcl] Other (See Comments)    "don't like the way I feel"   Metformin And Related Other (See Comments)    GI Upset    Medications: Scheduled: Continuous:  sodium chloride  500 mL (06/30/23 0658)    No results found for this or any previous visit (from the past 24 hours).   No results found.  ROS:  As stated above in the HPI otherwise negative.  Blood pressure (!) 149/87, pulse 73, temperature 98.1 F (36.7 C), temperature source Temporal, resp. rate 12, height 5\' 1"  (1.549 m), weight 101.6 kg, SpO2 96%.    PE: Gen: NAD, Alert and Oriented HEENT:  [place]/AT, EOMI Neck: Supple, no LAD Lungs: CTA Bilaterally CV: RRR without M/G/R ABD: Soft, NTND, +BS Ext: No C/C/E  Assessment/Plan: 1) Screening colonoscopy.  Samantha Greene D 06/30/2023, 7:24 AM

## 2023-06-30 NOTE — Op Note (Addendum)
 [facility] Patient Name: Samantha Greene Procedure Date: 06/30/2023 MRN: [id number] Attending MD: Alvis Jourdain , MD, [id number] Date of Birth: 1970/03/17 CSN: [id number] Age: 53 Admit Type: Ambulatory Procedure:                Colonoscopy Indications:              Screening for colorectal malignant neoplasm Providers:                Alvis Jourdain, MD, Gabino Joe, Technician,                            Bradley Caffey Referring MD:              Medicines:                Propofol  per Anesthesia Complications:            No immediate complications. Estimated Blood Loss:     Estimated blood loss: none. Procedure:                Pre-Anesthesia Assessment:                           - Prior to the procedure, a History and Physical                            was performed, and patient medications and                            allergies were reviewed. The patient's tolerance of                            previous anesthesia was also reviewed. The risks                            and benefits of the procedure and the sedation                            options and risks were discussed with the patient.                            All questions were answered, and informed consent                            was obtained. Prior Anticoagulants: The patient has                            taken no anticoagulant or antiplatelet agents. ASA                            Grade Assessment: III - A patient with severe                            systemic disease. After reviewing the risks and  benefits, the patient was deemed in satisfactory                            condition to undergo the procedure.                           - Sedation was administered by an anesthesia                            professional. Deep sedation was attained.                           After obtaining informed consent, the colonoscope                            was passed under  direct vision. Throughout the                            procedure, the patient's blood pressure, pulse, and                            oxygen  saturations were monitored continuously. The                            CF-HQ190L ([id number] Olympus colonoscope was                            introduced through the anus and advanced to the the                            cecum, identified by appendiceal orifice and                            ileocecal valve. The colonoscopy was performed                            without difficulty. The patient tolerated the                            procedure well. The quality of the bowel                            preparation was evaluated using the BBPS [facility]                            Bowel Preparation Scale) with scores of: Right                            Colon = 3, Transverse Colon = 3 and Left Colon = 3                            (entire mucosa seen well with no residual staining,  small fragments of stool or opaque liquid). The                            total BBPS score equals 9. The ileocecal valve,                            appendiceal orifice, and rectum were photographed. Scope In: 7:35:17 AM Scope Out: 7:51:23 AM Scope Withdrawal Time: 0 hours 10 minutes 36 seconds  Total Procedure Duration: 0 hours 16 minutes 6 seconds  Findings:      The entire examined colon appeared normal. Impression:               - The entire examined colon is normal.                           - No specimens collected. Moderate Sedation:      Not Applicable - Patient had care per Anesthesia. Recommendation:           - Patient has a contact number available for                            emergencies. The signs and symptoms of potential                            delayed complications were discussed with the                            patient. Return to normal activities tomorrow.                            Written discharge instructions were  provided to the                            patient.                           - Resume regular diet.                           - Continue present medications.                           - Repeat colonoscopy in 5 years for screening                            purposes. She has multiple second degree family                            members with colon cancer. The earliest onset was                            in the 11's. Procedure Code(s):        --- Professional ---                           [phone], Colonoscopy, flexible; diagnostic,  including                            collection of specimen(s) by brushing or washing,                            when performed (separate procedure) Diagnosis Code(s):        --- Professional ---                           Z12.11, Encounter for screening for malignant                            neoplasm of colon CPT copyright 2022 American Medical Association. All rights reserved. The codes documented in this report are preliminary and upon coder review may  be revised to meet current compliance requirements. Alvis Jourdain, MD Alvis Jourdain, MD 06/30/2023 8:04:48 AM This report has been signed electronically. Number of Addenda: 0

## 2023-06-30 NOTE — Transfer of Care (Signed)
 Immediate Anesthesia Transfer of Care Note  Patient: Samantha Greene  Procedure(s) Performed: COLONOSCOPY  Patient Location: PACU and Endoscopy Unit  Anesthesia Type:MAC  Level of Consciousness: awake  Airway & Oxygen  Therapy: Patient Spontanous Breathing and Patient connected to face mask oxygen   Post-op Assessment: Report given to RN and Post -op Vital signs reviewed and stable  Post vital signs: Reviewed and stable  Last Vitals:  Vitals Value Taken Time  BP 115/90 06/30/23 0755  Temp    Pulse 71 06/30/23 0756  Resp 23 06/30/23 0756  SpO2 97 % 06/30/23 0756  Vitals shown include unfiled device data.  Last Pain:  Vitals:   06/30/23 0639  TempSrc: Temporal  PainSc: 0-No pain         Complications: No notable events documented.

## 2023-07-04 ENCOUNTER — Encounter (HOSPITAL_COMMUNITY): Payer: Self-pay | Admitting: Gastroenterology

## 2023-07-19 DIAGNOSIS — G4733 Obstructive sleep apnea (adult) (pediatric): Secondary | ICD-10-CM | POA: Diagnosis not present

## 2023-08-10 ENCOUNTER — Ambulatory Visit: Admitting: Nurse Practitioner

## 2023-08-10 ENCOUNTER — Encounter: Payer: Self-pay | Admitting: Nurse Practitioner

## 2023-08-10 VITALS — BP 128/86 | HR 89 | Temp 98.3°F | Ht 61.0 in | Wt 227.0 lb

## 2023-08-10 DIAGNOSIS — K5909 Other constipation: Secondary | ICD-10-CM

## 2023-08-10 DIAGNOSIS — E782 Mixed hyperlipidemia: Secondary | ICD-10-CM | POA: Diagnosis not present

## 2023-08-10 DIAGNOSIS — G4700 Insomnia, unspecified: Secondary | ICD-10-CM

## 2023-08-10 DIAGNOSIS — E559 Vitamin D deficiency, unspecified: Secondary | ICD-10-CM | POA: Diagnosis not present

## 2023-08-10 DIAGNOSIS — F32A Depression, unspecified: Secondary | ICD-10-CM

## 2023-08-10 DIAGNOSIS — R7303 Prediabetes: Secondary | ICD-10-CM | POA: Diagnosis not present

## 2023-08-10 DIAGNOSIS — I10 Essential (primary) hypertension: Secondary | ICD-10-CM

## 2023-08-10 DIAGNOSIS — Z23 Encounter for immunization: Secondary | ICD-10-CM

## 2023-08-10 DIAGNOSIS — K219 Gastro-esophageal reflux disease without esophagitis: Secondary | ICD-10-CM

## 2023-08-10 DIAGNOSIS — N951 Menopausal and female climacteric states: Secondary | ICD-10-CM

## 2023-08-10 DIAGNOSIS — M2550 Pain in unspecified joint: Secondary | ICD-10-CM

## 2023-08-10 DIAGNOSIS — G8929 Other chronic pain: Secondary | ICD-10-CM

## 2023-08-10 DIAGNOSIS — F419 Anxiety disorder, unspecified: Secondary | ICD-10-CM

## 2023-08-10 DIAGNOSIS — G4731 Primary central sleep apnea: Secondary | ICD-10-CM | POA: Insufficient documentation

## 2023-08-10 LAB — COMPREHENSIVE METABOLIC PANEL WITH GFR
ALT: 26 U/L (ref 0–35)
AST: 19 U/L (ref 0–37)
Albumin: 4.5 g/dL (ref 3.5–5.2)
Alkaline Phosphatase: 65 U/L (ref 39–117)
BUN: 13 mg/dL (ref 6–23)
CO2: 28 meq/L (ref 19–32)
Calcium: 9.9 mg/dL (ref 8.4–10.5)
Chloride: 102 meq/L (ref 96–112)
Creatinine, Ser: 1.16 mg/dL (ref 0.40–1.20)
GFR: 53.81 mL/min — ABNORMAL LOW (ref >60.00)
Glucose, Bld: 84 mg/dL (ref 70–99)
Potassium: 4.3 meq/L (ref 3.5–5.1)
Sodium: 135 meq/L (ref 135–145)
Total Bilirubin: 0.5 mg/dL (ref 0.2–1.2)
Total Protein: 7.3 g/dL (ref 6.0–8.3)

## 2023-08-10 LAB — LIPID PANEL
Cholesterol: 263 mg/dL — ABNORMAL HIGH (ref 0–200)
HDL: 65.2 mg/dL (ref >39.00)
LDL Cholesterol: 161 mg/dL — ABNORMAL HIGH (ref 0–99)
NonHDL: 197.39
Total CHOL/HDL Ratio: 4
Triglycerides: 181 mg/dL — ABNORMAL HIGH (ref 0.0–149.0)
VLDL: 36.2 mg/dL (ref 0.0–40.0)

## 2023-08-10 LAB — HEMOGLOBIN A1C: Hgb A1c MFr Bld: 5.9 % (ref 4.6–6.5)

## 2023-08-10 LAB — CBC WITH DIFFERENTIAL/PLATELET
Basophils Absolute: 0 K/uL (ref 0.0–0.1)
Basophils Relative: 0.4 % (ref 0.0–3.0)
Eosinophils Absolute: 0.1 K/uL (ref 0.0–0.7)
Eosinophils Relative: 1.5 % (ref 0.0–5.0)
HCT: 40.1 % (ref 36.0–46.0)
Hemoglobin: 13.3 g/dL (ref 12.0–15.0)
Lymphocytes Relative: 29.4 % (ref 12.0–46.0)
Lymphs Abs: 1.9 K/uL (ref 0.7–4.0)
MCHC: 33.3 g/dL (ref 30.0–36.0)
MCV: 95.7 fl (ref 78.0–100.0)
Monocytes Absolute: 0.5 K/uL (ref 0.1–1.0)
Monocytes Relative: 7.3 % (ref 3.0–12.0)
Neutro Abs: 4 K/uL (ref 1.4–7.7)
Neutrophils Relative %: 61.4 % (ref 43.0–77.0)
Platelets: 293 K/uL (ref 150.0–400.0)
RBC: 4.19 Mil/uL (ref 3.87–5.11)
RDW: 12.9 % (ref 11.5–15.5)
WBC: 6.6 K/uL (ref 4.0–10.5)

## 2023-08-10 LAB — VITAMIN D 25 HYDROXY (VIT D DEFICIENCY, FRACTURES): VITD: 64.47 ng/mL (ref 30.00–100.00)

## 2023-08-10 LAB — TSH: TSH: 2.39 u[IU]/mL (ref 0.35–5.50)

## 2023-08-10 NOTE — Assessment & Plan Note (Signed)
 Chronic hypertension is well-controlled. Continue telmisartan  40mg  daily. Check CMP, CBC, TSH today.

## 2023-08-10 NOTE — Patient Instructions (Signed)
It was great to see you!  We are checking your labs today and will let you know the results via mychart/phone.   Let's follow-up in 9 months, sooner if you have concerns.  If a referral was placed today, you will be contacted for an appointment. Please note that routine referrals can sometimes take up to 3-4 weeks to process. Please call our office if you haven't heard anything after this time frame.  Take care,  Rodman Pickle, NP

## 2023-08-10 NOTE — Assessment & Plan Note (Signed)
 Chronic GERD is managed with omeprazole  40mg  daily.

## 2023-08-10 NOTE — Assessment & Plan Note (Signed)
 BMI 42.8. She is currently on the highest dose of semaglutide . Recommend focusing on nutrition and start slowly with exercising like walking in the neighborhood.

## 2023-08-10 NOTE — Progress Notes (Signed)
 New Patient Visit  BP 128/86 (BP Location: Left Arm, Patient Position: Sitting, Cuff Size: Normal)   Pulse 89   Temp 98.3 F (36.8 C) (Oral)   Ht 5' 1 (1.549 m)   Wt 227 lb (103 kg)   LMP  (LMP Unknown)   SpO2 96%   BMI 42.89 kg/m    Subjective:    Patient ID: Samantha Greene, female    DOB: January 14, 1971, 53 y.o.   MRN: [id number]  CC: Chief Complaint  Patient presents with   Establish Care    NP. Est. Care, request lab work-patient is not fasting    HPI: ANNALICIA Greene is a 53 y.o. female presents for new patient visit to establish care.  Introduced to Publishing rights manager role and practice setting.  All questions answered.  Discussed provider/patient relationship and expectations.  Discussed the use of AI scribe software for clinical note transcription with the patient, who gave verbal consent to proceed.  History of Present Illness   Samantha Greene is a 53 year old female who presents to establish care.  She has central sleep apnea and uses CPAP with oxygen  at night. Sleep quality has improved with trazodone  50-100mg  as needed, reducing awakenings to twice a night. Anxiety and depression are managed with Rexulti  1mg  daily, duloxetine  60mg  daily, and Wellbutrin  300mg  daily, with significant mood improvement. Occasional difficult days persist. She is following with psychiatry.  Acid reflux is managed with omeprazole  40mg  daily. High cholesterol is treated with rosuvastatin  5mg  daily, and hypertension is controlled with telmisartan  40mg  daily. She denies chest pain and shortness of breath. Joint pain in knees, elbows, and hips is alleviated with meloxicam  15mg  daily as needed.  Constipation is managed with Linzess  145 mcg every other day. She is on semaglutide  injections for weight loss, having lost approximately 26 pounds over the last several months. She is getting these through [facility] and is currently on the highest dose.   She experiences shortness of  breath when walking and has a history of liver repositioning surgery. Skin issues include small lesions, and she has not seen a dermatologist in a couple of years.  Hot flashes are effectively managed with Veozah 45mg  daily. She has undergone several D&Cs, a D&E, and a hysterectomy in April 2025 for excessive bleeding and fibroids, with satisfaction regarding the outcome.  There is a family history of colon and breast cancer, though her mother had colon polyps. She recently had a normal colonoscopy and a mammogram this year. She occasionally consumes alcohol and does not smoke or vape.        08/10/2023    2:38 PM 06/23/2021   11:20 AM 07/11/2019    3:05 PM 05/24/2019   10:28 AM 04/09/2019    3:15 PM  Depression screen PHQ 2/9  Decreased Interest 2 0 3 2 3   Down, Depressed, Hopeless 2 0 3 2 3   PHQ - 2 Score 4 0 6 4 6   Altered sleeping 3  3 3 3   Tired, decreased energy 1  3 3 3   Change in appetite 1  3 3 3   Feeling bad or failure about yourself  0  3 2 2   Trouble concentrating 1  3 3 3   Moving slowly or fidgety/restless 0  0 0 1  Suicidal thoughts 0  0 0 0  PHQ-9 Score 10  21 18 21   Difficult doing work/chores Somewhat difficult   Extremely dIfficult       08/10/2023  2:38 PM 07/11/2019    3:04 PM 02/11/2016    3:50 PM  GAD 7 : Generalized Anxiety Score  Nervous, Anxious, on Edge 0 3 2  Control/stop worrying 0 3 3  Worry too much - different things 0 3 3  Trouble relaxing 0 3 3  Restless 0 2 1  Easily annoyed or irritable 0 3 2  Afraid - awful might happen 0 0 0  Total GAD 7 Score 0 17 14  Anxiety Difficulty Somewhat difficult  Somewhat difficult    Past Medical History:  Diagnosis Date   Abnormal uterine bleeding (AUB)    Arthritis    Central sleep apnea syndrome    use 2L oxygen  thru cpap nightly   DOE (dyspnea on exertion)    05-05-2023  pt sob w/ stairs and long distance walking (recovers wallis and futuna when she stops)  but okay with household chores   Endometrial hyperplasia     Family history of breast cancer    Family history of colon cancer    Family history of lung cancer    Family history of pancreatic cancer    Family history of prostate cancer    GAD (generalized anxiety disorder)    GERD (gastroesophageal reflux disease)    History of kidney stones    Hyperlipidemia, mixed    Hypertension    Insomnia    Irritable bowel syndrome with constipation    GI-- dr kristie   MDD (major depressive disorder)    OSA on CPAP    followed by dr dohmeier;   HST in epic 11-07-2018 severe osa w/ central sleep apnea,  2L O2 added thru cpap 2021   Pre-diabetes    Seasonal allergies    Wears glasses     Past Surgical History:  Procedure Laterality Date   COLONOSCOPY N/A 06/30/2023   Procedure: COLONOSCOPY;  Surgeon: Rollin Dover, MD;  Location: [facility];  Service: Gastroenterology;  Laterality: N/A;   CYSTOSCOPY N/A 05/15/2023   Procedure: CYSTOSCOPY;  Surgeon: Claudene Larraine LABOR, DO;  Location: [facility] OR;  Service: Gynecology;  Laterality: N/A;   DILATATION & CURETTAGE/HYSTEROSCOPY WITH MYOSURE N/A 10/18/2019   Procedure: DILATATION & CURETTAGE/HYSTEROSCOPY WITH MYOSURE;  Surgeon: Lavoie, Marie-Lyne, MD;  Location: [facility];  Service: Gynecology;  Laterality: N/A;  request 11:30am OR time requests one hour   DILATION AND CURETTAGE OF UTERUS     x3     1992;   1997;   1998   DILATION AND EVACUATION  07/15/2005   @[facility]  by dr gorge;  missed ab   EPIGASTRIC HERNIA REPAIR N/A 03/23/2018   Procedure: PLICATION OF DIAPHRAGM;  Surgeon: Kerrin Elspeth BROCKS, MD;  Location: [facility] OR;  Service: Thoracic;  Laterality: N/A;   HYSTEROSCOPY WITH D & C  01/07/2011   @[facility]  by dr fredrik mill;  polypectomy   IR THORACENTESIS ASP PLEURAL SPACE W/IMG GUIDE  04/19/2018   VIDEO ASSISTED THORACOSCOPY (VATS)/THOROCOTOMY Right 03/23/2018   Procedure: VIDEO ASSISTED THORACOSCOPY;  Surgeon: Kerrin Elspeth BROCKS, MD;  Location[facility] OR;  Service: Thoracic;  Laterality: Right;     Family History  Problem Relation Age of Onset   Breast cancer Mother 86   Lung cancer Mother    Hypertension Mother    Diabetes Mother    Kidney disease Mother    Hyperlipidemia Mother    Congestive Heart Failure Mother    Colon polyps Mother        multiple   Hyperlipidemia Brother    Hypertension  Brother    Diabetes Brother    Breast cancer Maternal Aunt        dx 50+   Diabetes Maternal Aunt    Hyperlipidemia Maternal Aunt    Heart disease Maternal Aunt    Hypertension Maternal Aunt    Stroke Maternal Aunt    Colon cancer Maternal Aunt        dx 50+   Colon polyps Maternal Aunt        multiple   Heart attack Maternal Aunt    Colon cancer Maternal Aunt        dx 50+   Stroke Maternal Aunt    Atrial fibrillation Maternal Aunt    Colon cancer Maternal Uncle        dx 50+   Diabetes Maternal Uncle    Hyperlipidemia Maternal Uncle    Heart disease Maternal Uncle    Hypertension Maternal Uncle    Stroke Maternal Uncle    Colon cancer Maternal Uncle        dx 50+   Prostate cancer Maternal Uncle        dx 50+   Polycystic ovary syndrome Paternal Aunt    Ovarian cancer Paternal Aunt    Cervical cancer Paternal Aunt    Hemochromatosis Paternal Uncle    Heart disease Maternal Grandmother        Needed pacemaker   Breast cancer Cousin        double mastectomy   Prostate cancer Cousin    Prostate cancer Cousin      Social History   Tobacco Use   Smoking status: Never   Smokeless tobacco: Never  Vaping Use   Vaping status: Never Used  Substance Use Topics   Alcohol use: Not Currently    Comment: occasionally   Drug use: Not Currently    Comment: 05-05-2023  pt stated quit THC gummies 02/ 2025    Current Outpatient Medications on File Prior to Visit  Medication Sig Dispense Refill   aspirin  EC 81 MG tablet Take 81 mg by mouth in the morning. Swallow whole.     brexpiprazole  (REXULTI ) 1 MG TABS tablet Take 1 mg by mouth in the morning.     buPROPion   (WELLBUTRIN  XL) 300 MG 24 hr tablet TAKE 1 TABLET EVERY MORNING FOR MOOD, FOCUS & CONCENTRATION 90 tablet 3   cetirizine (ZYRTEC) 10 MG tablet Take 10 mg by mouth at bedtime.     Cholecalciferol (VITAMIN D ) 125 MCG (5000 UT) CAPS Take 10,000 Units by mouth in the morning.     DULoxetine  (CYMBALTA ) 60 MG capsule TAKE 1 CAPSULE BY MOUTH EVERY DAY 90 capsule 1   linaclotide  (LINZESS ) 145 MCG CAPS capsule Take 145 mcg by mouth daily before breakfast.     meloxicam  (MOBIC ) 15 MG tablet Take 15 mg by mouth as needed.     montelukast  (SINGULAIR ) 10 MG tablet TAKE 1 TABLET BY MOUTH DAILY FOR ALLERGIES 90 tablet 3   omeprazole  (PRILOSEC) 40 MG capsule Take 1 capsule (40 mg total) by mouth daily. TAKE 1 CAPSULE BY MOUTH EVERY DAY 90 capsule 0   rosuvastatin  (CRESTOR ) 5 MG tablet TAKE 1 TABLET BY MOUTH EVERY DAY FOR CHOLESTEROL 90 tablet 3   SEMAGLUTIDE -WEIGHT MANAGEMENT [place] Inject 2.5 mg into the skin every Monday. Compounded via [facility]     telmisartan  (MICARDIS ) 40 MG tablet TAKE 1 TABLET BY MOUTH EVERY DAY FOR BLOOD PRESSURE 90 tablet 0   tiZANidine  (ZANAFLEX ) 4 MG tablet  Take 4 mg by mouth at bedtime.     traZODone  (DESYREL ) 50 MG tablet Take 25-100 mg by mouth at bedtime.     VEOZAH 45 MG TABS Take 1 tablet by mouth daily.     diphenhydrAMINE  HCl, Sleep, (ZZZQUIL) 25 MG CAPS Take 1 capsule by mouth at bedtime. (Patient not taking: Reported on 08/10/2023)     Multiple Vitamin (MULTIVITAMIN) tablet Take 1 tablet by mouth daily.     No current facility-administered medications on file prior to visit.     Review of Systems  Constitutional:  Positive for fatigue. Negative for fever.  HENT: Negative.    Respiratory: Negative.    Cardiovascular: Negative.   Gastrointestinal: Negative.   Genitourinary: Negative.   Musculoskeletal:  Positive for arthralgias.  Skin: Negative.   Neurological: Negative.   Psychiatric/Behavioral:  Positive for sleep disturbance (improving). Negative for  dysphoric mood. The patient is not nervous/anxious.         Objective:    BP 128/86 (BP Location: Left Arm, Patient Position: Sitting, Cuff Size: Normal)   Pulse 89   Temp 98.3 F (36.8 C) (Oral)   Ht 5' 1 (1.549 m)   Wt 227 lb (103 kg)   LMP  (LMP Unknown)   SpO2 96%   BMI 42.89 kg/m   Wt Readings from Last 3 Encounters:  08/10/23 227 lb (103 kg)  06/30/23 224 lb (101.6 kg)  05/15/23 223 lb (101.2 kg)    BP Readings from Last 3 Encounters:  08/10/23 128/86  06/30/23 127/73  05/15/23 (!) 146/90    Physical Exam Vitals and nursing note reviewed.  Constitutional:      General: She is not in acute distress.    Appearance: Normal appearance.  HENT:     Head: Normocephalic and atraumatic.  Eyes:     Conjunctiva/sclera: Conjunctivae normal.  Cardiovascular:     Rate and Rhythm: Normal rate and regular rhythm.     Pulses: Normal pulses.     Heart sounds: Normal heart sounds.  Pulmonary:     Effort: Pulmonary effort is normal.     Breath sounds: Normal breath sounds.  Abdominal:     Palpations: Abdomen is soft.     Tenderness: There is no abdominal tenderness.  Musculoskeletal:        General: Normal range of motion.     Cervical back: Normal range of motion and neck supple.     Right lower leg: No edema.     Left lower leg: No edema.  Lymphadenopathy:     Cervical: No cervical adenopathy.  Skin:    General: Skin is warm and dry.  Neurological:     General: No focal deficit present.     Mental Status: She is alert and oriented to person, place, and time.     Cranial Nerves: No cranial nerve deficit.     Coordination: Coordination normal.     Gait: Gait normal.  Psychiatric:        Mood and Affect: Mood normal.        Behavior: Behavior normal.        Thought Content: Thought content normal.        Judgment: Judgment normal.        Assessment & Plan:   Problem List Items Addressed This Visit       Cardiovascular and Mediastinum   Hypertension -  Primary   Chronic hypertension is well-controlled. Continue telmisartan  40mg  daily. Check CMP, CBC, TSH today.  Relevant Orders   CBC with Differential/Platelet   Comprehensive metabolic panel with GFR   TSH     Digestive   Gastroesophageal reflux disease   Chronic GERD is managed with omeprazole  40mg  daily.      Chronic constipation   Chronic constipation is managed with Linzess  145 mcg taken every other day to avoid diarrhea.        Other   Insomnia   Chronic, stable. Continue trazodone  50-100mg  at bedtime per psychiatry recommendations.       Hyperlipidemia   Chronic hyperlipidemia is managed with rosuvastatin  5mg  daily. Check CMP, CBC, and lipid panel today and adjust regimen based on results.       Relevant Orders   CBC with Differential/Platelet   Comprehensive metabolic panel with GFR   Lipid panel   Vitamin D  deficiency   Check vitamin D  and adjust OTC regimen based on results.       Relevant Orders   VITAMIN D  25 Hydroxy (Vit-D Deficiency, Fractures)   Morbid obesity (HCC)   BMI 42.8. She is currently on the highest dose of semaglutide . Recommend focusing on nutrition and start slowly with exercising like walking in the neighborhood.      Central sleep apnea   Chronic central sleep apnea is managed with CPAP and supplemental oxygen  at night.      Prediabetes   Chronic, stable. Check A1c today.       Relevant Orders   Hemoglobin A1c   Anxiety and depression   Chronic anxiety and depression are managed with Wellbutrin  300mg  daily, Rexulti  1mg  daily, and Cymbalta  60mg  daily. Her mood is generally stable, with Rexulti  providing a significant positive impact. Continue collaboration and recommendations from psychiatry.       Relevant Medications   traZODone  (DESYREL ) 50 MG tablet   Menopausal symptoms   Menopausal symptoms are managed with Veozah 45mg  daily, with significant improvement reported. Continue collaboration and recommendations from GYN.        Chronic joint pain   Chronic joint pain is effectively managed with meloxicam  15mg  daily, providing relief. She typically has pain in her knees, hips, and elbows.       Relevant Medications   meloxicam  (MOBIC ) 15 MG tablet   traZODone  (DESYREL ) 50 MG tablet   tiZANidine  (ZANAFLEX ) 4 MG tablet   Other Visit Diagnoses       Immunization due       Shingrix  #1 given today. come back in 2-6 months for nurse visit for second dose.   Relevant Orders   Varicella-zoster vaccine IM (Completed)        Follow up plan: Return in about 9 months (around 05/10/2024) for CPE.  Lovis More A Alexsa Flaum

## 2023-08-10 NOTE — Assessment & Plan Note (Signed)
 Chronic joint pain is effectively managed with meloxicam  15mg  daily, providing relief. She typically has pain in her knees, hips, and elbows.

## 2023-08-10 NOTE — Assessment & Plan Note (Signed)
 Chronic constipation is managed with Linzess  145 mcg taken every other day to avoid diarrhea.

## 2023-08-10 NOTE — Assessment & Plan Note (Signed)
 Check vitamin D  and adjust OTC regimen based on results.

## 2023-08-10 NOTE — Assessment & Plan Note (Signed)
Chronic, stable. Check A1c today.

## 2023-08-10 NOTE — Assessment & Plan Note (Signed)
 Menopausal symptoms are managed with Veozah 45mg  daily, with significant improvement reported. Continue collaboration and recommendations from GYN.

## 2023-08-10 NOTE — Assessment & Plan Note (Signed)
 Chronic hyperlipidemia is managed with rosuvastatin  5mg  daily. Check CMP, CBC, and lipid panel today and adjust regimen based on results.

## 2023-08-10 NOTE — Assessment & Plan Note (Signed)
 Chronic anxiety and depression are managed with Wellbutrin  300mg  daily, Rexulti  1mg  daily, and Cymbalta  60mg  daily. Her mood is generally stable, with Rexulti  providing a significant positive impact. Continue collaboration and recommendations from psychiatry.

## 2023-08-10 NOTE — Assessment & Plan Note (Signed)
 Chronic, stable. Continue trazodone  50-100mg  at bedtime per psychiatry recommendations.

## 2023-08-10 NOTE — Assessment & Plan Note (Signed)
 Chronic central sleep apnea is managed with CPAP and supplemental oxygen  at night.

## 2023-08-11 ENCOUNTER — Ambulatory Visit: Payer: Self-pay | Admitting: Nurse Practitioner

## 2023-08-11 DIAGNOSIS — E782 Mixed hyperlipidemia: Secondary | ICD-10-CM

## 2023-08-11 MED ORDER — ROSUVASTATIN CALCIUM 10 MG PO TABS: 10.0000 mg | ORAL_TABLET | Freq: Every day | ORAL | 3 refills | Status: AC

## 2023-08-14 ENCOUNTER — Encounter: Payer: Self-pay | Admitting: Nurse Practitioner

## 2023-08-18 ENCOUNTER — Encounter: Payer: Self-pay | Admitting: Advanced Practice Midwife

## 2023-08-18 DIAGNOSIS — G4733 Obstructive sleep apnea (adult) (pediatric): Secondary | ICD-10-CM | POA: Diagnosis not present

## 2023-08-29 ENCOUNTER — Ambulatory Visit

## 2023-09-11 ENCOUNTER — Ambulatory Visit: Admitting: Nurse Practitioner

## 2023-09-18 DIAGNOSIS — G4733 Obstructive sleep apnea (adult) (pediatric): Secondary | ICD-10-CM | POA: Diagnosis not present

## 2023-10-09 DIAGNOSIS — N951 Menopausal and female climacteric states: Secondary | ICD-10-CM | POA: Diagnosis not present

## 2023-10-09 DIAGNOSIS — N952 Postmenopausal atrophic vaginitis: Secondary | ICD-10-CM | POA: Diagnosis not present

## 2023-10-09 DIAGNOSIS — L659 Nonscarring hair loss, unspecified: Secondary | ICD-10-CM | POA: Diagnosis not present

## 2023-10-10 ENCOUNTER — Encounter: Payer: Self-pay | Admitting: Nurse Practitioner

## 2023-10-12 ENCOUNTER — Telehealth: Payer: BC Managed Care – PPO | Admitting: Adult Health

## 2023-10-12 ENCOUNTER — Encounter: Payer: Self-pay | Admitting: Adult Health

## 2023-10-12 DIAGNOSIS — G4733 Obstructive sleep apnea (adult) (pediatric): Secondary | ICD-10-CM | POA: Diagnosis not present

## 2023-10-12 DIAGNOSIS — G4719 Other hypersomnia: Secondary | ICD-10-CM

## 2023-10-12 NOTE — Progress Notes (Signed)
 Lloyde Ludlam D, CMA  Joylene Bradley; Garcia, Patricia; Ziegler, Melissa; Tucker, Dolanda; Cain, Godfrey New orders have been placed for the above pt, DOB: May 23, 2070 Thanks

## 2023-10-12 NOTE — Progress Notes (Signed)
 PATIENT: Samantha Greene DOB: 1970-09-09  REASON FOR VISIT: Sleep apnea follow up HISTORY FROM: patient   Virtual Visit via Video Note  I connected with Samantha Greene on 10/12/23 at  2:15 PM EDT by a video enabled telemedicine application and verified that I am speaking with the correct person using two identifiers.  Location: Patient: at home Provider: in office, [facility]   I discussed the limitations of evaluation and management by telemedicine and the availability of in person appointments. The patient expressed understanding and agreed to proceed.       Brief HPI:  Samantha Greene is a 53 y.o. female who was evaluated by Dr. Chalice in 10/2018 for possible sleep disorder with complaints of snoring and insomnia. FSS 14/24. FSS 60/63.  HST 11/07/2018 showed severe sleep apnea with total AHI 37.5/h.  Recommended CPAP titration due to concern of possible central sleep apnea component but was denied by insurance therefore proceeded with AutoPap therapy, set up 11/23/2018 and initiated 2 L via O2 bled into CPAP in 2021.   At prior visit, compliance report showed adequate usage and optimal residual AHI.  Noted some improvement of daytime energy levels.    Interval history:  Patient returns for CPAP compliance visit via MyChart video visit.  Compliance report as below showing excellent usage and optimal residual AHI.  Reports overall tolerating well.  Reports improvement of sleep quality and daytime energy levels since psychiatry started her on trazodone .  She also remains on Rexulti , Wellbutrin  and Cymbalta .  Feels mood overall stable. Routinely followed by DME [facility], up to date on supplies.  No questions or concerns at this time.             REVIEW OF SYSTEMS: Out of a complete 14 system review of symptoms, the patient complains only of the following symptoms, and all other reviewed systems are negative. Fatigue    ALLERGIES: Allergies  Allergen  Reactions   Doxycycline  Shortness Of Breath and Anxiety   Erythromycin Nausea Only and Other (See Comments)    Stomach cramping (can take Z Pak)   Flexeril  [Cyclobenzaprine  Hcl] Other (See Comments)    don't like the way I feel   Metformin And Related Other (See Comments)    GI Upset    HOME MEDICATIONS: Outpatient Medications Prior to Visit  Medication Sig Dispense Refill   aspirin  EC 81 MG tablet Take 81 mg by mouth in the morning. Swallow whole.     brexpiprazole  (REXULTI ) 1 MG TABS tablet Take 1 mg by mouth in the morning.     buPROPion  (WELLBUTRIN  XL) 300 MG 24 hr tablet TAKE 1 TABLET EVERY MORNING FOR MOOD, FOCUS & CONCENTRATION 90 tablet 3   cetirizine (ZYRTEC) 10 MG tablet Take 10 mg by mouth at bedtime.     Cholecalciferol (VITAMIN D ) 125 MCG (5000 UT) CAPS Take 10,000 Units by mouth in the morning.     diphenhydrAMINE  HCl, Sleep, (ZZZQUIL) 25 MG CAPS Take 1 capsule by mouth at bedtime. (Patient not taking: Reported on 08/10/2023)     DULoxetine  (CYMBALTA ) 60 MG capsule TAKE 1 CAPSULE BY MOUTH EVERY DAY 90 capsule 1   linaclotide  (LINZESS ) 145 MCG CAPS capsule Take 145 mcg by mouth daily before breakfast.     meloxicam  (MOBIC ) 15 MG tablet Take 15 mg by mouth as needed.     montelukast  (SINGULAIR ) 10 MG tablet TAKE 1 TABLET BY MOUTH DAILY FOR ALLERGIES 90 tablet 3   Multiple Vitamin (MULTIVITAMIN)  tablet Take 1 tablet by mouth daily.     omeprazole  (PRILOSEC) 40 MG capsule Take 1 capsule (40 mg total) by mouth daily. TAKE 1 CAPSULE BY MOUTH EVERY DAY 90 capsule 0   rosuvastatin  (CRESTOR ) 10 MG tablet Take 1 tablet (10 mg total) by mouth daily. 90 tablet 3   SEMAGLUTIDE -WEIGHT MANAGEMENT Routt Inject 2.5 mg into the skin every Monday. Compounded via [facility]     telmisartan  (MICARDIS ) 40 MG tablet TAKE 1 TABLET BY MOUTH EVERY DAY FOR BLOOD PRESSURE 90 tablet 0   tiZANidine  (ZANAFLEX ) 4 MG tablet Take 4 mg by mouth at bedtime.     traZODone  (DESYREL ) 50 MG tablet Take  25-100 mg by mouth at bedtime.     VEOZAH 45 MG TABS Take 1 tablet by mouth daily.     No facility-administered medications prior to visit.    PAST MEDICAL HISTORY: Past Medical History:  Diagnosis Date   Abnormal uterine bleeding (AUB)    Arthritis    Central sleep apnea syndrome    use 2L oxygen  thru cpap nightly   DOE (dyspnea on exertion)    05-05-2023  pt sob w/ stairs and long distance walking (recovers wallis and futuna when she stops)  but okay with household chores   Endometrial hyperplasia    Family history of breast cancer    Family history of colon cancer    Family history of lung cancer    Family history of pancreatic cancer    Family history of prostate cancer    GAD (generalized anxiety disorder)    GERD (gastroesophageal reflux disease)    History of kidney stones    Hyperlipidemia, mixed    Hypertension    Insomnia    Irritable bowel syndrome with constipation    GI-- dr kristie   MDD (major depressive disorder)    OSA on CPAP    followed by dr dohmeier;   HST in epic 11-07-2018 severe osa w/ central sleep apnea,  2L O2 added thru cpap 2021   Pre-diabetes    Seasonal allergies    Wears glasses     PAST SURGICAL HISTORY: Past Surgical History:  Procedure Laterality Date   COLONOSCOPY N/A 06/30/2023   Procedure: COLONOSCOPY;  Surgeon: Rollin Dover, MD;  Location: [facility];  Service: Gastroenterology;  Laterality: N/A;   CYSTOSCOPY N/A 05/15/2023   Procedure: CYSTOSCOPY;  Surgeon: Claudene Larraine LABOR, DO;  Location: [facility] OR;  Service: Gynecology;  Laterality: N/A;   DILATATION & CURETTAGE/HYSTEROSCOPY WITH MYOSURE N/A 10/18/2019   Procedure: DILATATION & CURETTAGE/HYSTEROSCOPY WITH MYOSURE;  Surgeon: Lavoie, Marie-Lyne, MD;  Location: [facility];  Service: Gynecology;  Laterality: N/A;  request 11:30am OR time requests one hour   DILATION AND CURETTAGE OF UTERUS     x3     1992;   1997;   1998   DILATION AND EVACUATION  07/15/2005   @[facility]  by dr gorge;   missed ab   EPIGASTRIC HERNIA REPAIR N/A 03/23/2018   Procedure: PLICATION OF DIAPHRAGM;  Surgeon: Kerrin Elspeth BROCKS, MD;  Location: [facility] OR;  Service: Thoracic;  Laterality: N/A;   HYSTEROSCOPY WITH D & C  01/07/2011   @[facility]  by dr fredrik mill;  polypectomy   IR THORACENTESIS ASP PLEURAL SPACE W/IMG GUIDE  04/19/2018   VIDEO ASSISTED THORACOSCOPY (VATS)/THOROCOTOMY Right 03/23/2018   Procedure: VIDEO ASSISTED THORACOSCOPY;  Surgeon: Kerrin Elspeth BROCKS, MD;  Location: [facility] OR;  Service: Thoracic;  Laterality: Right;    FAMILY HISTORY: Family  History  Problem Relation Age of Onset   Breast cancer Mother 14   Lung cancer Mother    Hypertension Mother    Diabetes Mother    Kidney disease Mother    Hyperlipidemia Mother    Congestive Heart Failure Mother    Colon polyps Mother        multiple   Hyperlipidemia Brother    Hypertension Brother    Diabetes Brother    Breast cancer Maternal Aunt        dx 50+   Diabetes Maternal Aunt    Hyperlipidemia Maternal Aunt    Heart disease Maternal Aunt    Hypertension Maternal Aunt    Stroke Maternal Aunt    Colon cancer Maternal Aunt        dx 50+   Colon polyps Maternal Aunt        multiple   Heart attack Maternal Aunt    Colon cancer Maternal Aunt        dx 50+   Stroke Maternal Aunt    Atrial fibrillation Maternal Aunt    Colon cancer Maternal Uncle        dx 50+   Diabetes Maternal Uncle    Hyperlipidemia Maternal Uncle    Heart disease Maternal Uncle    Hypertension Maternal Uncle    Stroke Maternal Uncle    Colon cancer Maternal Uncle        dx 50+   Prostate cancer Maternal Uncle        dx 50+   Polycystic ovary syndrome Paternal Aunt    Ovarian cancer Paternal Aunt    Cervical cancer Paternal Aunt    Hemochromatosis Paternal Uncle    Heart disease Maternal Grandmother        Needed pacemaker   Breast cancer Cousin        double mastectomy   Prostate cancer Cousin    Prostate cancer Cousin     SOCIAL  HISTORY: Social History   Socioeconomic History   Marital status: Married    Spouse name: Not on file   Number of children: Not on file   Years of education: Not on file   Highest education level: Not on file  Occupational History   Occupation: Treatment Cordinator    Employer: [employer]  Tobacco Use   Smoking status: Never   Smokeless tobacco: Never  Vaping Use   Vaping status: Never Used  Substance and Sexual Activity   Alcohol use: Not Currently    Comment: occasionally   Drug use: Not Currently    Comment: 05-05-2023  pt stated quit THC gummies 02/ 2025   Sexual activity: Yes    Partners: Male    Birth control/protection: Surgical  Other Topics Concern   Not on file  Social History Narrative   Not on file   Social Drivers of Health   Financial Resource Strain: Not on file  Food Insecurity: No Food Insecurity (05/15/2023)   Hunger Vital Sign    Worried About Running Out of Food in the Last Year: Never true    Ran Out of Food in the Last Year: Never true  Transportation Needs: No Transportation Needs (05/15/2023)   PRAPARE - Administrator, Civil Service (Medical): No    Lack of Transportation (Non-Medical): No  Physical Activity: Not on file  Stress: Not on file  Social Connections: Not on file  Intimate Partner Violence: Not At Risk (05/15/2023)   Humiliation, Afraid, Rape, and Kick questionnaire  Fear of Current or Ex-Partner: No    Emotionally Abused: No    Physically Abused: No    Sexually Abused: No      PHYSICAL EXAM  General: well developed, well nourished, very pleasant middle-age Caucasian female, seated, in no evident distress  Neurologic Exam Mental Status: Awake and fully alert. Normal language and speech. Oriented to place and time. Recent and remote memory intact. Attention span, concentration and fund of knowledge appropriate. Mood and affect appropriate.       DIAGNOSTIC DATA (LABS, IMAGING, TESTING) - I reviewed  patient records, labs, notes, testing and imaging myself where available.  Sleep study 11/07/2018 Severe sleep apnea at AHI 37.5/h. but with an unusual distribution due to accentuation in NREM sleep. This can be reflecting central sleep apnea. No tachy-bradycardia and no hypoxemia were noted.  The short recording time is consistent with Insomnia. Reasons of insomnia beyond hear rate, oxygen  saturation and breathing pattern are not reflected in a HST.     Lab Results  Component Value Date   WBC 6.6 08/10/2023   HGB 13.3 08/10/2023   HCT 40.1 08/10/2023   MCV 95.7 08/10/2023   PLT 293.0 08/10/2023      Component Value Date/Time   NA 135 08/10/2023 1429   K 4.3 08/10/2023 1429   CL 102 08/10/2023 1429   CO2 28 08/10/2023 1429   GLUCOSE 84 08/10/2023 1429   BUN 13 08/10/2023 1429   CREATININE 1.16 08/10/2023 1429   CREATININE 1.00 01/03/2023 1508   CALCIUM  9.9 08/10/2023 1429   PROT 7.3 08/10/2023 1429   ALBUMIN 4.5 08/10/2023 1429   AST 19 08/10/2023 1429   ALT 26 08/10/2023 1429   ALKPHOS 65 08/10/2023 1429   BILITOT 0.5 08/10/2023 1429   GFRNONAA >60 05/09/2023 1140   GFRNONAA 72 03/17/2020 1133   GFRAA 83 03/17/2020 1133   Lab Results  Component Value Date   CHOL 263 (H) 08/10/2023   HDL 65.20 08/10/2023   LDLCALC 161 (H) 08/10/2023   TRIG 181.0 (H) 08/10/2023   CHOLHDL 4 08/10/2023   Lab Results  Component Value Date   HGBA1C 5.9 08/10/2023   Lab Results  Component Value Date   VITAMINB12 586 03/17/2020   Lab Results  Component Value Date   TSH 2.39 08/10/2023      ASSESSMENT AND PLAN 53 y.o. year old female  has a past medical history of Abnormal uterine bleeding (AUB), Arthritis, Central sleep apnea syndrome, DOE (dyspnea on exertion), Endometrial hyperplasia, Family history of breast cancer, Family history of colon cancer, Family history of lung cancer, Family history of pancreatic cancer, Family history of prostate cancer, GAD (generalized anxiety  disorder), GERD (gastroesophageal reflux disease), History of kidney stones, Hyperlipidemia, mixed, Hypertension, Insomnia, Irritable bowel syndrome with constipation, MDD (major depressive disorder), OSA on CPAP, Pre-diabetes, Seasonal allergies, and Wears glasses. here for follow-up regarding severe sleep apnea on CPAP diagnosed on 11/07/2018 by Dr. Chalice.     Sleep apnea on CPAP -Continue ongoing compliance for excellent management of sleep apnea -Continue current pressure settings of 6-18 with EPR 3, continue 2L 02 bled into CPAP - due for new machine in 11/2023, will place order to repeat HST and will be set up with new CPAP if indicated -Continue to follow with D[facility] for new supplies or CPAP related concerns  Excessive daytime fatigue -Improvement noted since prior visit -Declines MLST as this requires discontinuing antidepressants and feels mood currently stable -Narcolepsy lab work negative -Continue to  follow with psychiatry for mood disorder and insomnia     She will follow-up in 31 to 90 days after receiving new CPAP machine and then will resume yearly follow up visit.    CC:  McElwee, Lauren A, NP    I personally spent a total of 25 minutes in the care of the patient today including preparing to see the patient, performing a medically appropriate exam/evaluation, counseling and educating, placing orders, and documenting clinical information in the EHR.   Harlene Bogaert, MSN, AGNP-BC [facility] [address] [place], [place] [address] [phone]

## 2023-10-16 DIAGNOSIS — F3341 Major depressive disorder, recurrent, in partial remission: Secondary | ICD-10-CM | POA: Diagnosis not present

## 2023-10-16 DIAGNOSIS — F419 Anxiety disorder, unspecified: Secondary | ICD-10-CM | POA: Diagnosis not present

## 2023-10-17 ENCOUNTER — Ambulatory Visit (INDEPENDENT_AMBULATORY_CARE_PROVIDER_SITE_OTHER)

## 2023-10-17 DIAGNOSIS — Z23 Encounter for immunization: Secondary | ICD-10-CM | POA: Diagnosis not present

## 2023-10-17 NOTE — Progress Notes (Signed)
 Per orders of Tinnie Harada pt is here for 2nd dose of Shingles vaccine. Pt received 2nd dose shingles vaccine in right deltoid at 1:27pm. Given by Greer Norse CMA II, Pt tolerated 2nd dose shingles vaccine well. Series completed.

## 2023-10-20 ENCOUNTER — Ambulatory Visit (INDEPENDENT_AMBULATORY_CARE_PROVIDER_SITE_OTHER): Admitting: Neurology

## 2023-10-20 DIAGNOSIS — G4733 Obstructive sleep apnea (adult) (pediatric): Secondary | ICD-10-CM | POA: Diagnosis not present

## 2023-10-21 ENCOUNTER — Encounter: Payer: Self-pay | Admitting: Adult Health

## 2023-10-24 ENCOUNTER — Encounter: Payer: Self-pay | Admitting: Adult Health

## 2023-10-24 NOTE — Telephone Encounter (Signed)
 Duplicate message

## 2023-11-06 ENCOUNTER — Encounter: Payer: Self-pay | Admitting: Adult Health

## 2023-11-08 ENCOUNTER — Ambulatory Visit: Admitting: Nurse Practitioner

## 2023-11-08 NOTE — Progress Notes (Signed)
 [name] 53 year old female Jun 06, 1970   HOME SLEEP TEST REPORT ( by Elene  mail -out device )    Study Protocol:  The SANSA single-point-of-skin-contact chest-worn sensor (an FDA cleared and DOT approved type 4 home sleep test device) measures eight physiological channels,  including blood oxygen  saturation (measured via PPG [photoplethysmography]), EKG-derived heart rate, respiratory effort, chest movement (measured via accelerometer), snoring, body position, and actigraphy. The device is designed to be worn for up to 10 hours per study.    STUDY DATE:   10-20-2023   Data received :  11-06-2023     ORDERING CLINICIAN: Harlene Bogaert , NP,    Video visit on 10-09-2023; Samantha Greene is a 53 y.o. female who was evaluated by Dr. Chalice in 10/2018 for possible sleep disorder with complaints of snoring and insomnia. FSS 14/24. FSS 60/63.  HST 11/07/2018 showed severe sleep apnea with total AHI 37.5/h.  Recommended inlab CPAP titration due to concern of possible central sleep apnea component but was denied by insurance- therefore proceeded with AutoPap therapy, set up 11/23/2018 and initiated 2 L via O2 bled into CPAP in 2021.  Mask type was not quoted.   CPAP 6-18 cm water , 3 cm EPR, residual AHI 0.4/h      Epworth sleepiness score:  FSS 14/24. FSS 60/63.  HST 11/07/2018 showed severe sleep apnea with total AHI 37.5/h , )2 was bled into the CPAP .   BMI: kg/m ( Video visit ) , Neck Circumference: X   FINDINGS:  Sleep Summary:   Recording Time started on 10-30-2023 at 22:48 hours .      Total Sleep Time (hours, min):  8 h and 36 minutes    Sleep efficiency %;  89%                                      Respiratory Indices by AASM criteria of scoring: Calculated pAHI (per hour):   21.9/h                                               Positional  respiratory activity  / snoring : snoring Volume was moderately -loud, and present  for over 50 % of the recorded sleep time.  Prone sleep position was associated with the lowest snoring volume and AHI.    Oxygen  Saturation  in Sleep    Oxygen  Saturation (%) Mean:   92%                 O2 Saturation Range (%):   82% through 97%                                     O2 Saturation (minutes) <89%:   6 minutes ,  but a high number of brief desaturation events was noted.      Pulse Rate in Sleep :   Pulse Mean (bpm):  72 bpm               Pulse Range: between 57 and 83 bpm, in NSR .  IMPRESSION:  This HST confirms the presence of  moderate -severe OSA ( obstructive sleep apnea)    RECOMMENDATION: This patient is interested to continue with PAP therapy and just needs a new autotitration CPAP device order, the settings should be the same as with the previous CPAP device.  CPAP 6-18 cm water , 3 cm EPR, heated humidification  and her interface ( mask of choice)  PAP Therapy remains the most effective treatment for sleep apnea associated with hypoxia , bradycardia or in patients with BMI over 35.  A follow up visit on the new CPAP device will be scheduled with the provider in 3-4 months.    Any Patient endorsing a high level of sleepiness should be cautioned not to drive, work at heights, or operate dangerous machinery or heavy equipment when tired or sleepy.  Review of good sleep hygiene measures took place in the initial consultation but should be revisited ( Your guide to better sleep  a publication by the NIH is a good source of information).   The referring provider will be notified of the test results.    I certify that I have reviewed the raw data recording prior to the issuance of this report in accordance with the standards of the American Academy of Sleep Medicine (AASM).    INTERPRETING PHYSICIAN:   Dedra Gores, MD  [facility] and [facility] Sleep Board certified by The [employer] of Sleep Medicine and Diplomate of the  [employer] of [employer]. Board certified In Neurology through the ABPN, Fellow of the [employer] of Neurology.

## 2023-11-09 ENCOUNTER — Encounter: Payer: Self-pay | Admitting: Nurse Practitioner

## 2023-11-09 NOTE — Telephone Encounter (Signed)
 Requesting: tiZANidine  (ZANAFLEX ) 4 MG tablet  Last Visit: 08/10/2023 Next Visit: Visit date not found Last Refill: 08/09/2023 by Historical Provider  Please Advise

## 2023-11-10 MED ORDER — TIZANIDINE HCL 4 MG PO TABS: 4.0000 mg | ORAL_TABLET | Freq: Every day | ORAL | 1 refills | Status: AC

## 2023-11-13 NOTE — Procedures (Signed)
 [name] 53 year old female Jun 06, 1970   HOME SLEEP TEST REPORT ( by Elene  mail -out device )    Study Protocol:  The SANSA single-point-of-skin-contact chest-worn sensor (an FDA cleared and DOT approved type 4 home sleep test device) measures eight physiological channels,  including blood oxygen  saturation (measured via PPG [photoplethysmography]), EKG-derived heart rate, respiratory effort, chest movement (measured via accelerometer), snoring, body position, and actigraphy. The device is designed to be worn for up to 10 hours per study.    STUDY DATE:   10-20-2023   Data received :  11-06-2023     ORDERING CLINICIAN: Harlene Bogaert , NP,    Video visit on 10-09-2023; Samantha Greene is a 53 y.o. female who was evaluated by Dr. Chalice in 10/2018 for possible sleep disorder with complaints of snoring and insomnia. FSS 14/24. FSS 60/63.  HST 11/07/2018 showed severe sleep apnea with total AHI 37.5/h.  Recommended inlab CPAP titration due to concern of possible central sleep apnea component but was denied by insurance- therefore proceeded with AutoPap therapy, set up 11/23/2018 and initiated 2 L via O2 bled into CPAP in 2021.  Mask type was not quoted.   CPAP 6-18 cm water , 3 cm EPR, residual AHI 0.4/h      Epworth sleepiness score:  FSS 14/24. FSS 60/63.  HST 11/07/2018 showed severe sleep apnea with total AHI 37.5/h , )2 was bled into the CPAP .   BMI: kg/m ( Video visit ) , Neck Circumference: X   FINDINGS:  Sleep Summary:   Recording Time started on 10-30-2023 at 22:48 hours .      Total Sleep Time (hours, min):  8 h and 36 minutes    Sleep efficiency %;  89%                                      Respiratory Indices by AASM criteria of scoring: Calculated pAHI (per hour):   21.9/h                                               Positional  respiratory activity  / snoring : snoring Volume was moderately -loud, and present  for over 50 % of the recorded sleep time.  Prone sleep position was associated with the lowest snoring volume and AHI.    Oxygen  Saturation  in Sleep    Oxygen  Saturation (%) Mean:   92%                 O2 Saturation Range (%):   82% through 97%                                     O2 Saturation (minutes) <89%:   6 minutes ,  but a high number of brief desaturation events was noted.      Pulse Rate in Sleep :   Pulse Mean (bpm):  72 bpm               Pulse Range: between 57 and 83 bpm, in NSR .  IMPRESSION:  This HST confirms the presence of  moderate -severe OSA ( obstructive sleep apnea)    RECOMMENDATION: This patient is interested to continue with PAP therapy and just needs a new autotitration CPAP device order, the settings should be the same as with the previous CPAP device.  CPAP 6-18 cm water , 3 cm EPR, heated humidification  and her interface ( mask of choice)  PAP Therapy remains the most effective treatment for sleep apnea associated with hypoxia , bradycardia or in patients with BMI over 35.  A follow up visit on the new CPAP device will be scheduled with the provider in 3-4 months.    Any Patient endorsing a high level of sleepiness should be cautioned not to drive, work at heights, or operate dangerous machinery or heavy equipment when tired or sleepy.  Review of good sleep hygiene measures took place in the initial consultation but should be revisited ( Your guide to better sleep  a publication by the NIH is a good source of information).   The referring provider will be notified of the test results.    I certify that I have reviewed the raw data recording prior to the issuance of this report in accordance with the standards of the American Academy of Sleep Medicine (AASM).    INTERPRETING PHYSICIAN:   Dedra Gores, MD  [facility] and [facility] Sleep Board certified by The [employer] of Sleep Medicine and Diplomate of the  [employer]. Board certified In Neurology through the ABPN, Fellow of the [employer] of Neu[employer].

## 2023-11-14 ENCOUNTER — Ambulatory Visit: Payer: Self-pay | Admitting: Adult Health

## 2023-11-14 DIAGNOSIS — G4733 Obstructive sleep apnea (adult) (pediatric): Secondary | ICD-10-CM

## 2023-11-17 ENCOUNTER — Encounter: Payer: Self-pay | Admitting: Nurse Practitioner

## 2023-11-29 ENCOUNTER — Inpatient Hospital Stay: Admitting: Nurse Practitioner

## 2023-12-06 ENCOUNTER — Encounter: Payer: Self-pay | Admitting: Adult Health

## 2023-12-06 ENCOUNTER — Ambulatory Visit (INDEPENDENT_AMBULATORY_CARE_PROVIDER_SITE_OTHER): Admitting: Nurse Practitioner

## 2023-12-06 ENCOUNTER — Encounter: Payer: Self-pay | Admitting: Nurse Practitioner

## 2023-12-06 VITALS — BP 122/80 | HR 87 | Temp 98.1°F | Ht 61.0 in | Wt 226.0 lb

## 2023-12-06 DIAGNOSIS — Z9049 Acquired absence of other specified parts of digestive tract: Secondary | ICD-10-CM | POA: Diagnosis not present

## 2023-12-06 DIAGNOSIS — Z23 Encounter for immunization: Secondary | ICD-10-CM | POA: Diagnosis not present

## 2023-12-06 DIAGNOSIS — K5909 Other constipation: Secondary | ICD-10-CM | POA: Diagnosis not present

## 2023-12-06 MED ORDER — LINACLOTIDE 145 MCG PO CAPS: 145.0000 ug | ORAL_CAPSULE | Freq: Every day | ORAL | 0 refills | Status: DC

## 2023-12-06 NOTE — Assessment & Plan Note (Signed)
 Incisions are healing well without infection or complications. Pain has improved with no current abdominal pain or nausea. Monitor incision sites for infection. Avoid heavy lifting for two weeks and increase activity as tolerated. Avoid fried foods.

## 2023-12-06 NOTE — Assessment & Plan Note (Signed)
 Constipation is managed with Linzess . A lower dose is requested due to the previous high dose being too strong. Recent surgery may affect bowel habits. Refill Linzess  at 145 mcg. Monitor bowel habits and adjust medication as needed.

## 2023-12-06 NOTE — Patient Instructions (Signed)
 It was great to see you!  Keep an eye on the incisions, the glue will fall off with time   Reach out if you develop fevers, redness, or drainage from the incision sites   Slowly increase activity as tolerated   Let's follow-up with any concerns   Take care,  Tinnie Harada, NP

## 2023-12-06 NOTE — Progress Notes (Signed)
 Established Patient Office Visit  Subjective   Patient ID: Samantha Greene, female    DOB: [birth year]  Age: 53 y.o. MRN: [id number]  Chief Complaint  Patient presents with   Hospitalization Follow-up    Gallbladder removal on 11/17/23, Rx refill, Flu Vaccine    HPI Discussed the use of AI scribe software for clinical note transcription with the patient, who gave verbal consent to proceed.  History of Present Illness   Samantha Greene is a 53 year old female who presents for post-cholecystectomy follow-up after experiencing severe abdominal pain and vomiting.  She experienced severe abdominal pain and abdominal distension after eating dinner, followed by vomiting for twelve hours. Despite initial treatment with Zofran  and IV fluids, significant pain persisted for several days. She underwent emergency gallbladder removal surgery in [place] . Post-surgery, significant pain persisted for two weeks, unrelieved by prescribed pain medication, however pain has improved now. She denies fevers, drainage from incisions.   She had been experiencing intermittent side pain prior to her vacation, with two notable episodes before the trip.        ROS See pertinent positives and negatives per HPI.    Objective:     BP 122/80 (BP Location: Left Arm, Patient Position: Sitting, Cuff Size: Normal)   Pulse 87   Temp 98.1 F (36.7 C) (Oral)   Ht 5' 1 (1.549 m)   Wt 226 lb (102.5 kg)   LMP  (LMP Unknown)   SpO2 98%   BMI 42.70 kg/m  BP Readings from Last 3 Encounters:  12/06/23 122/80  08/10/23 128/86  06/30/23 127/73   Wt Readings from Last 3 Encounters:  12/06/23 226 lb (102.5 kg)  08/10/23 227 lb (103 kg)  06/30/23 224 lb (101.6 kg)      Physical Exam Vitals and nursing note reviewed.  Constitutional:      General: She is not in acute distress.    Appearance: Normal appearance.  HENT:     Head: Normocephalic.  Eyes:     Conjunctiva/sclera:  Conjunctivae normal.  Cardiovascular:     Rate and Rhythm: Normal rate and regular rhythm.     Pulses: Normal pulses.     Heart sounds: Normal heart sounds.  Pulmonary:     Effort: Pulmonary effort is normal.     Breath sounds: Normal breath sounds.  Abdominal:     Palpations: Abdomen is soft.     Tenderness: There is no abdominal tenderness.  Musculoskeletal:     Cervical back: Normal range of motion.  Skin:    General: Skin is warm.     Comments: Incisions intact, healing.   Neurological:     General: No focal deficit present.     Mental Status: She is alert and oriented to person, place, and time.  Psychiatric:        Mood and Affect: Mood normal.        Behavior: Behavior normal.        Thought Content: Thought content normal.        Judgment: Judgment normal.    The 10-year ASCVD risk score (Arnett DK, et al., 2019) is: 2.3%    Assessment & Plan:   Problem List Items Addressed This Visit       Digestive   Chronic constipation - Primary   Constipation is managed with Linzess . A lower dose is requested due to the previous high dose being too strong. Recent surgery may affect bowel habits. Refill Linzess  at 145 mcg.  Monitor bowel habits and adjust medication as needed.         Other   S/P cholecystectomy   Incisions are healing well without infection or complications. Pain has improved with no current abdominal pain or nausea. Monitor incision sites for infection. Avoid heavy lifting for two weeks and increase activity as tolerated. Avoid fried foods.      Other Visit Diagnoses       Immunization due       Flu vaccine given today   Relevant Orders   Flu vaccine trivalent PF, 6mos and older(Flulaval,Afluria,Fluarix,Fluzone ) (Completed)       Return if symptoms worsen or fail to improve.    Tinnie DELENA Harada, NP

## 2023-12-06 NOTE — Telephone Encounter (Signed)
 Message sent to [employer] to check on order.

## 2023-12-06 NOTE — Telephone Encounter (Signed)
 [employer] had an order for supplies but not for the new machine. I have sent the order to them.

## 2023-12-06 NOTE — Telephone Encounter (Signed)
 New, Adine Boring, Heather CROME, RN; Joylene Adine Done. This has been sent to intake for review. I ask for stat review tohelp speed this up due to the time between study and order.  Thank you,  Arvella Joylene

## 2023-12-07 ENCOUNTER — Encounter: Payer: Self-pay | Admitting: Nurse Practitioner

## 2023-12-07 DIAGNOSIS — J302 Other seasonal allergic rhinitis: Secondary | ICD-10-CM

## 2023-12-08 MED ORDER — MONTELUKAST SODIUM 10 MG PO TABS: ORAL_TABLET | ORAL | 3 refills | Status: AC

## 2023-12-11 DIAGNOSIS — G4733 Obstructive sleep apnea (adult) (pediatric): Secondary | ICD-10-CM | POA: Diagnosis not present

## 2023-12-15 ENCOUNTER — Encounter: Payer: Self-pay | Admitting: Nurse Practitioner

## 2023-12-15 ENCOUNTER — Ambulatory Visit (HOSPITAL_COMMUNITY)
Admission: RE | Admit: 2023-12-15 | Discharge: 2023-12-15 | Disposition: A | Discharge status: Home / Self Care | Source: Ambulatory Visit | Attending: Nurse Practitioner | Admitting: Nurse Practitioner

## 2023-12-15 ENCOUNTER — Ambulatory Visit: Payer: Self-pay | Admitting: Nurse Practitioner

## 2023-12-15 ENCOUNTER — Ambulatory Visit: Admitting: Nurse Practitioner

## 2023-12-15 VITALS — BP 124/90 | HR 92 | Temp 97.1°F | Ht 61.0 in | Wt 228.4 lb

## 2023-12-15 DIAGNOSIS — R1031 Right lower quadrant pain: Secondary | ICD-10-CM

## 2023-12-15 DIAGNOSIS — K5792 Diverticulitis of intestine, part unspecified, without perforation or abscess without bleeding: Secondary | ICD-10-CM | POA: Diagnosis not present

## 2023-12-15 DIAGNOSIS — N200 Calculus of kidney: Secondary | ICD-10-CM | POA: Diagnosis not present

## 2023-12-15 DIAGNOSIS — K5730 Diverticulosis of large intestine without perforation or abscess without bleeding: Secondary | ICD-10-CM | POA: Diagnosis not present

## 2023-12-15 LAB — POCT URINALYSIS DIPSTICK
Bilirubin, UA: NEGATIVE
Blood, UA: POSITIVE
Glucose, UA: NEGATIVE
Ketones, UA: NEGATIVE
Leukocytes, UA: NEGATIVE
Nitrite, UA: NEGATIVE
Protein, UA: POSITIVE — AB
Spec Grav, UA: 1.015 (ref 1.010–1.025)
Urobilinogen, UA: 0.2 U/dL
pH, UA: 6 (ref 5.0–8.0)

## 2023-12-15 LAB — CBC WITH DIFFERENTIAL/PLATELET
Basophils Absolute: 0 K/uL (ref 0.0–0.1)
Basophils Relative: 0.3 % (ref 0.0–3.0)
Eosinophils Absolute: 0.2 K/uL (ref 0.0–0.7)
Eosinophils Relative: 4.1 % (ref 0.0–5.0)
HCT: 40.5 % (ref 36.0–46.0)
Hemoglobin: 13.4 g/dL (ref 12.0–15.0)
Lymphocytes Relative: 36.9 % (ref 12.0–46.0)
Lymphs Abs: 2.1 K/uL (ref 0.7–4.0)
MCHC: 33 g/dL (ref 30.0–36.0)
MCV: 95.7 fl (ref 78.0–100.0)
Monocytes Absolute: 0.5 K/uL (ref 0.1–1.0)
Monocytes Relative: 7.8 % (ref 3.0–12.0)
Neutro Abs: 2.9 K/uL (ref 1.4–7.7)
Neutrophils Relative %: 50.9 % (ref 43.0–77.0)
Platelets: 280 K/uL (ref 150.0–400.0)
RBC: 4.24 Mil/uL (ref 3.87–5.11)
RDW: 13.3 % (ref 11.5–15.5)
WBC: 5.8 K/uL (ref 4.0–10.5)

## 2023-12-15 LAB — COMPREHENSIVE METABOLIC PANEL WITH GFR
ALT: 33 U/L (ref 0–35)
AST: 25 U/L (ref 0–37)
Albumin: 4.4 g/dL (ref 3.5–5.2)
Alkaline Phosphatase: 76 U/L (ref 39–117)
BUN: 11 mg/dL (ref 6–23)
CO2: 29 meq/L (ref 19–32)
Calcium: 9.4 mg/dL (ref 8.4–10.5)
Chloride: 102 meq/L (ref 96–112)
Creatinine, Ser: 1 mg/dL (ref 0.40–1.20)
GFR: 64.15 mL/min (ref >60.00)
Glucose, Bld: 85 mg/dL (ref 70–99)
Potassium: 3.9 meq/L (ref 3.5–5.1)
Sodium: 140 meq/L (ref 135–145)
Total Bilirubin: 0.5 mg/dL (ref 0.2–1.2)
Total Protein: 7.2 g/dL (ref 6.0–8.3)

## 2023-12-15 MED ORDER — IOHEXOL 300 MG/ML  SOLN
100.0000 mL | Freq: Once | INTRAMUSCULAR | Status: AC | PRN
  Administered 2023-12-15: 100 mL via INTRAVENOUS

## 2023-12-15 NOTE — Patient Instructions (Signed)
 It was great to see you!  We are checking your labs today and will let you know the results via mychart/phone.   I have ordered a CT scan, they will call to schedule   You can use heat, tylenol  and ibuprofen  as needed for pain  Let's follow-up based on results   Take care,  Tinnie Harada, NP

## 2023-12-15 NOTE — Progress Notes (Signed)
 Acute Office Visit  Subjective:     Patient ID: Samantha Greene, female    DOB: July 30, 1970, 53 y.o.   MRN: [id number]  Chief Complaint  Patient presents with   Pelvic Pain    Lower right pelvic pain for 1 week dull ache    HPI Discussed the use of AI scribe software for clinical note transcription with the patient, who gave verbal consent to proceed.  History of Present Illness   Samantha Greene is a 53 year old female who presents with right lower quadrant abdominal pain.  She experiences dull, intermittent right lower quadrant abdominal pain for about a week, rated 6/10 in severity. The pain is not constant and resolves quickly. She has not used medication for relief but maintains fluid intake. She denies back pain and fever but has chills, increased frequency of urination without dysuria, mild nausea, and mild diarrhea. Her medical history includes a hysterectomy with ovaries intact.     ROS See pertinent positives and negatives per HPI.     Objective:    BP (!) 124/90 (BP Location: Left Arm, Cuff Size: Large)   Pulse 92   Temp (!) 97.1 F (36.2 C)   Ht 5' 1 (1.549 m)   Wt 228 lb 6.4 oz (103.6 kg)   LMP  (LMP Unknown)   SpO2 96%   BMI 43.16 kg/m    Physical Exam Vitals and nursing note reviewed.  Constitutional:      General: She is not in acute distress.    Appearance: Normal appearance.  HENT:     Head: Normocephalic.  Eyes:     Conjunctiva/sclera: Conjunctivae normal.  Cardiovascular:     Rate and Rhythm: Normal rate and regular rhythm.     Pulses: Normal pulses.     Heart sounds: Normal heart sounds.  Pulmonary:     Effort: Pulmonary effort is normal.     Breath sounds: Normal breath sounds.  Abdominal:     General: There is no distension.     Palpations: Abdomen is soft. There is no mass.     Tenderness: There is abdominal tenderness (Right pelvic area). There is no guarding.  Musculoskeletal:     Cervical back: Normal range of  motion.  Skin:    General: Skin is warm.  Neurological:     General: No focal deficit present.     Mental Status: She is alert and oriented to person, place, and time.  Psychiatric:        Mood and Affect: Mood normal.        Behavior: Behavior normal.        Thought Content: Thought content normal.        Judgment: Judgment normal.     Results for orders placed or performed in visit on 12/15/23  CBC with Differential/Platelet  Result Value Ref Range   WBC 5.8 4.0 - 10.5 K/uL   RBC 4.24 3.87 - 5.11 Mil/uL   Hemoglobin 13.4 12.0 - 15.0 g/dL   HCT 59.4 63.9 - 53.9 %   MCV 95.7 78.0 - 100.0 fl   MCHC 33.0 30.0 - 36.0 g/dL   RDW 86.6 88.4 - 84.4 %   Platelets 280.0 150.0 - 400.0 K/uL   Neutrophils Relative % 50.9 43.0 - 77.0 %   Lymphocytes Relative 36.9 12.0 - 46.0 %   Monocytes Relative 7.8 3.0 - 12.0 %   Eosinophils Relative 4.1 0.0 - 5.0 %   Basophils Relative 0.3 0.0 -  3.0 %   Neutro Abs 2.9 1.4 - 7.7 K/uL   Lymphs Abs 2.1 0.7 - 4.0 K/uL   Monocytes Absolute 0.5 0.1 - 1.0 K/uL   Eosinophils Absolute 0.2 0.0 - 0.7 K/uL   Basophils Absolute 0.0 0.0 - 0.1 K/uL  Comprehensive metabolic panel with GFR  Result Value Ref Range   Sodium 140 135 - 145 mEq/L   Potassium 3.9 3.5 - 5.1 mEq/L   Chloride 102 96 - 112 mEq/L   CO2 29 19 - 32 mEq/L   Glucose, Bld 85 70 - 99 mg/dL   BUN 11 6 - 23 mg/dL   Creatinine, Ser 8.99 0.40 - 1.20 mg/dL   Total Bilirubin 0.5 0.2 - 1.2 mg/dL   Alkaline Phosphatase 76 39 - 117 U/L   AST 25 0 - 37 U/L   ALT 33 0 - 35 U/L   Total Protein 7.2 6.0 - 8.3 g/dL   Albumin 4.4 3.5 - 5.2 g/dL   GFR 35.84 >39.99 mL/min   Calcium  9.4 8.4 - 10.5 mg/dL  POCT urinalysis dipstick  Result Value Ref Range   Color, UA     Clarity, UA     Glucose, UA Negative Negative   Bilirubin, UA Negative    Ketones, UA Negative    Spec Grav, UA 1.015 1.010 - 1.025   Blood, UA Positive    pH, UA 6.0 5.0 - 8.0   Protein, UA Positive (A) Negative   Urobilinogen, UA  0.2 0.2 or 1.0 E.U./dL   Nitrite, UA Negative    Leukocytes, UA Negative Negative   Appearance     Odor          Assessment & Plan:   Problem List Items Addressed This Visit       Other   Right lower quadrant pain - Primary   She has experienced intermittent dull pain for one week, rated 6/10. The differential diagnosis includes UTI, ovarian cyst, appendicitis, and musculoskeletal strain. Appendicitis is less likely due to the intermittent nature of the pain. Ordered urinalysis to rule out UTI, CMP, CBC, and a CT scan of the abdomen to evaluate for an ovarian cyst or other causes. Advised use of heat, ibuprofen , or acetaminophen  for pain management.       Relevant Orders   CBC with Differential/Platelet (Completed)   Comprehensive metabolic panel with GFR (Completed)   POCT urinalysis dipstick (Completed)   CT ABDOMEN PELVIS W CONTRAST (Completed)    No orders of the defined types were placed in this encounter.   Return if symptoms worsen or fail to improve.  Tinnie DELENA Harada, NP

## 2023-12-17 DIAGNOSIS — R1031 Right lower quadrant pain: Secondary | ICD-10-CM | POA: Insufficient documentation

## 2023-12-17 NOTE — Assessment & Plan Note (Signed)
 She has experienced intermittent dull pain for one week, rated 6/10. The differential diagnosis includes UTI, ovarian cyst, appendicitis, and musculoskeletal strain. Appendicitis is less likely due to the intermittent nature of the pain. Ordered urinalysis to rule out UTI, CMP, CBC, and a CT scan of the abdomen to evaluate for an ovarian cyst or other causes. Advised use of heat, ibuprofen , or acetaminophen  for pain management.

## 2024-01-30 ENCOUNTER — Encounter: Payer: Self-pay | Admitting: Nurse Practitioner

## 2024-01-31 MED ORDER — LINACLOTIDE 145 MCG PO CAPS: 145.0000 ug | ORAL_CAPSULE | Freq: Every day | ORAL | 1 refills | Status: AC

## 2024-03-01 ENCOUNTER — Encounter: Payer: Self-pay | Admitting: Nurse Practitioner

## 2024-03-01 DIAGNOSIS — K219 Gastro-esophageal reflux disease without esophagitis: Secondary | ICD-10-CM

## 2024-03-01 MED ORDER — OMEPRAZOLE 40 MG PO CPDR: 40.0000 mg | DELAYED_RELEASE_CAPSULE | Freq: Every day | ORAL | 1 refills | Status: AC

## 2024-03-06 ENCOUNTER — Encounter: Payer: Self-pay | Admitting: Nurse Practitioner

## 2024-03-06 DIAGNOSIS — K219 Gastro-esophageal reflux disease without esophagitis: Secondary | ICD-10-CM

## 2024-03-06 NOTE — Progress Notes (Unsigned)
 Setup date 12/13/2023

## 2024-03-06 NOTE — Telephone Encounter (Signed)
 Requesting: omeprazole  (PRILOSEC) 40 MG capsule  Last Visit: 12/15/2023 Next Visit: 05/10/2024 Last Refill: 03/01/2024 to [facility] [address] in [place]  Please Advise

## 2024-03-07 ENCOUNTER — Ambulatory Visit: Admitting: Adult Health

## 2024-03-07 ENCOUNTER — Encounter: Payer: Self-pay | Admitting: Adult Health

## 2024-03-07 VITALS — BP 134/84 | HR 85 | Ht 61.0 in | Wt 233.8 lb

## 2024-03-07 DIAGNOSIS — G4733 Obstructive sleep apnea (adult) (pediatric): Secondary | ICD-10-CM

## 2024-03-07 NOTE — Patient Instructions (Addendum)
 Samantha Greene

## 2024-05-10 ENCOUNTER — Ambulatory Visit: Admitting: Nurse Practitioner

## 2025-03-13 ENCOUNTER — Telehealth: Admitting: Adult Health
